# Patient Record
Sex: Female | Born: 1935
Health system: Southern US, Community
[De-identification: ages and names within clinical notes are randomized; demographics above are authoritative.]

## PROBLEM LIST (undated history)

## (undated) ENCOUNTER — Emergency Department (HOSPITAL_BASED_OUTPATIENT_CLINIC_OR_DEPARTMENT_OTHER): Admission: EM | Payer: Medicare Other

## (undated) DIAGNOSIS — C4490 Unspecified malignant neoplasm of skin, unspecified: Secondary | ICD-10-CM

## (undated) DIAGNOSIS — I4891 Unspecified atrial fibrillation: Secondary | ICD-10-CM

## (undated) DIAGNOSIS — M316 Other giant cell arteritis: Secondary | ICD-10-CM

## (undated) DIAGNOSIS — H3530 Unspecified macular degeneration: Secondary | ICD-10-CM

## (undated) DIAGNOSIS — C801 Malignant (primary) neoplasm, unspecified: Secondary | ICD-10-CM

## (undated) DIAGNOSIS — I10 Essential (primary) hypertension: Secondary | ICD-10-CM

## (undated) DIAGNOSIS — M353 Polymyalgia rheumatica: Secondary | ICD-10-CM

## (undated) DIAGNOSIS — M810 Age-related osteoporosis without current pathological fracture: Secondary | ICD-10-CM

## (undated) DIAGNOSIS — N189 Chronic kidney disease, unspecified: Secondary | ICD-10-CM

## (undated) HISTORY — DX: Unspecified macular degeneration: H35.30

## (undated) HISTORY — DX: Malignant (primary) neoplasm, unspecified: C80.1

## (undated) HISTORY — DX: Other giant cell arteritis: M31.6

## (undated) HISTORY — DX: Age-related osteoporosis without current pathological fracture: M81.0

## (undated) HISTORY — DX: Polymyalgia rheumatica: M35.3

## (undated) HISTORY — DX: Unspecified malignant neoplasm of skin, unspecified: C44.90

## (undated) HISTORY — DX: Essential (primary) hypertension: I10

---

## 1996-01-07 HISTORY — PX: MASTECTOMY: SHX3

## 1997-09-18 ENCOUNTER — Ambulatory Visit (HOSPITAL_COMMUNITY): Admission: RE | Admit: 1997-09-18 | Discharge: 1997-09-18 | Payer: Self-pay | Admitting: *Deleted

## 1997-09-29 ENCOUNTER — Ambulatory Visit (HOSPITAL_COMMUNITY): Admission: RE | Admit: 1997-09-29 | Discharge: 1997-09-29 | Payer: Self-pay | Admitting: *Deleted

## 1998-09-24 ENCOUNTER — Ambulatory Visit (HOSPITAL_COMMUNITY): Admission: RE | Admit: 1998-09-24 | Discharge: 1998-09-24 | Payer: Self-pay | Admitting: *Deleted

## 1998-12-06 ENCOUNTER — Ambulatory Visit (HOSPITAL_COMMUNITY): Admission: RE | Admit: 1998-12-06 | Discharge: 1998-12-06 | Payer: Self-pay | Admitting: *Deleted

## 1999-04-04 ENCOUNTER — Other Ambulatory Visit: Admission: RE | Admit: 1999-04-04 | Discharge: 1999-04-04 | Payer: Self-pay | Admitting: *Deleted

## 1999-10-09 ENCOUNTER — Ambulatory Visit (HOSPITAL_COMMUNITY): Admission: RE | Admit: 1999-10-09 | Discharge: 1999-10-09 | Payer: Self-pay | Admitting: *Deleted

## 2000-02-27 ENCOUNTER — Other Ambulatory Visit: Admission: RE | Admit: 2000-02-27 | Discharge: 2000-02-27 | Payer: Self-pay | Admitting: Surgery

## 2000-10-09 ENCOUNTER — Encounter: Payer: Self-pay | Admitting: Oncology

## 2000-10-09 ENCOUNTER — Ambulatory Visit (HOSPITAL_COMMUNITY): Admission: RE | Admit: 2000-10-09 | Discharge: 2000-10-09 | Payer: Self-pay | Admitting: Oncology

## 2000-10-29 ENCOUNTER — Other Ambulatory Visit: Admission: RE | Admit: 2000-10-29 | Discharge: 2000-10-29 | Payer: Self-pay | Admitting: *Deleted

## 2002-10-14 ENCOUNTER — Encounter: Payer: Self-pay | Admitting: Oncology

## 2002-10-14 ENCOUNTER — Encounter: Admission: RE | Admit: 2002-10-14 | Discharge: 2002-10-14 | Payer: Self-pay | Admitting: Oncology

## 2003-11-03 ENCOUNTER — Encounter: Admission: RE | Admit: 2003-11-03 | Discharge: 2003-11-03 | Payer: Self-pay | Admitting: Family Medicine

## 2004-10-16 ENCOUNTER — Encounter (INDEPENDENT_AMBULATORY_CARE_PROVIDER_SITE_OTHER): Payer: Self-pay | Admitting: Specialist

## 2004-10-16 ENCOUNTER — Ambulatory Visit (HOSPITAL_BASED_OUTPATIENT_CLINIC_OR_DEPARTMENT_OTHER): Admission: RE | Admit: 2004-10-16 | Discharge: 2004-10-16 | Payer: Self-pay | Admitting: General Surgery

## 2004-11-22 ENCOUNTER — Encounter: Admission: RE | Admit: 2004-11-22 | Discharge: 2004-11-22 | Payer: Self-pay | Admitting: Family Medicine

## 2005-11-24 ENCOUNTER — Encounter: Admission: RE | Admit: 2005-11-24 | Discharge: 2005-11-24 | Payer: Self-pay | Admitting: Family Medicine

## 2006-11-27 ENCOUNTER — Encounter: Admission: RE | Admit: 2006-11-27 | Discharge: 2006-11-27 | Payer: Self-pay | Admitting: *Deleted

## 2006-12-09 ENCOUNTER — Encounter: Admission: RE | Admit: 2006-12-09 | Discharge: 2006-12-09 | Payer: Self-pay | Admitting: *Deleted

## 2007-03-12 ENCOUNTER — Ambulatory Visit: Payer: Self-pay | Admitting: Nephrology

## 2007-03-12 ENCOUNTER — Observation Stay (HOSPITAL_COMMUNITY): Admission: EM | Admit: 2007-03-12 | Discharge: 2007-03-13 | Payer: Self-pay | Admitting: Emergency Medicine

## 2007-03-29 ENCOUNTER — Encounter: Admission: RE | Admit: 2007-03-29 | Discharge: 2007-03-29 | Payer: Self-pay | Admitting: Gastroenterology

## 2007-03-31 ENCOUNTER — Encounter: Admission: RE | Admit: 2007-03-31 | Discharge: 2007-03-31 | Payer: Self-pay | Admitting: Gastroenterology

## 2007-08-05 ENCOUNTER — Ambulatory Visit (HOSPITAL_COMMUNITY): Admission: RE | Admit: 2007-08-05 | Discharge: 2007-08-05 | Payer: Self-pay | Admitting: Ophthalmology

## 2007-12-07 ENCOUNTER — Encounter: Admission: RE | Admit: 2007-12-07 | Discharge: 2007-12-07 | Payer: Self-pay | Admitting: Family Medicine

## 2008-12-07 ENCOUNTER — Encounter: Admission: RE | Admit: 2008-12-07 | Discharge: 2008-12-07 | Payer: Self-pay | Admitting: Family Medicine

## 2009-06-30 ENCOUNTER — Encounter: Admission: RE | Admit: 2009-06-30 | Discharge: 2009-06-30 | Payer: Self-pay | Admitting: Sports Medicine

## 2009-07-01 ENCOUNTER — Encounter (INDEPENDENT_AMBULATORY_CARE_PROVIDER_SITE_OTHER): Payer: Self-pay | Admitting: Emergency Medicine

## 2009-07-01 ENCOUNTER — Emergency Department (HOSPITAL_COMMUNITY): Admission: EM | Admit: 2009-07-01 | Discharge: 2009-07-01 | Payer: Self-pay | Admitting: Emergency Medicine

## 2009-07-01 ENCOUNTER — Ambulatory Visit: Payer: Self-pay | Admitting: Surgery

## 2009-07-02 ENCOUNTER — Ambulatory Visit: Payer: Self-pay | Admitting: Cardiology

## 2009-07-02 ENCOUNTER — Inpatient Hospital Stay (HOSPITAL_COMMUNITY): Admission: AD | Admit: 2009-07-02 | Discharge: 2009-07-10 | Payer: Self-pay | Admitting: Orthopedic Surgery

## 2009-07-02 ENCOUNTER — Encounter: Payer: Self-pay | Admitting: Internal Medicine

## 2009-07-02 DIAGNOSIS — A4902 Methicillin resistant Staphylococcus aureus infection, unspecified site: Secondary | ICD-10-CM | POA: Insufficient documentation

## 2009-07-02 DIAGNOSIS — M009 Pyogenic arthritis, unspecified: Secondary | ICD-10-CM | POA: Insufficient documentation

## 2009-07-02 HISTORY — DX: Methicillin resistant Staphylococcus aureus infection, unspecified site: A49.02

## 2009-07-03 ENCOUNTER — Ambulatory Visit: Payer: Self-pay | Admitting: Internal Medicine

## 2009-07-05 ENCOUNTER — Encounter (INDEPENDENT_AMBULATORY_CARE_PROVIDER_SITE_OTHER): Payer: Self-pay | Admitting: Orthopedic Surgery

## 2009-07-10 ENCOUNTER — Encounter (INDEPENDENT_AMBULATORY_CARE_PROVIDER_SITE_OTHER): Payer: Self-pay | Admitting: Orthopedic Surgery

## 2009-07-16 ENCOUNTER — Encounter (INDEPENDENT_AMBULATORY_CARE_PROVIDER_SITE_OTHER): Payer: Self-pay | Admitting: *Deleted

## 2009-07-16 DIAGNOSIS — M353 Polymyalgia rheumatica: Secondary | ICD-10-CM | POA: Insufficient documentation

## 2009-07-16 DIAGNOSIS — I10 Essential (primary) hypertension: Secondary | ICD-10-CM | POA: Insufficient documentation

## 2009-07-16 DIAGNOSIS — M316 Other giant cell arteritis: Secondary | ICD-10-CM

## 2009-07-17 ENCOUNTER — Ambulatory Visit: Payer: Self-pay | Admitting: Internal Medicine

## 2009-07-20 ENCOUNTER — Encounter: Payer: Self-pay | Admitting: Internal Medicine

## 2009-09-04 ENCOUNTER — Ambulatory Visit: Payer: Self-pay | Admitting: Internal Medicine

## 2009-09-18 ENCOUNTER — Encounter: Payer: Self-pay | Admitting: Internal Medicine

## 2009-09-26 ENCOUNTER — Inpatient Hospital Stay (HOSPITAL_COMMUNITY): Admission: RE | Admit: 2009-09-26 | Discharge: 2009-09-29 | Payer: Self-pay | Admitting: Orthopedic Surgery

## 2009-09-27 ENCOUNTER — Ambulatory Visit: Payer: Self-pay | Admitting: Infectious Diseases

## 2009-10-13 ENCOUNTER — Emergency Department (HOSPITAL_COMMUNITY): Admission: EM | Admit: 2009-10-13 | Discharge: 2009-10-13 | Payer: Self-pay | Admitting: Emergency Medicine

## 2009-10-15 ENCOUNTER — Ambulatory Visit (HOSPITAL_COMMUNITY): Admission: RE | Admit: 2009-10-15 | Discharge: 2009-10-15 | Payer: Self-pay | Admitting: Orthopedic Surgery

## 2009-10-31 ENCOUNTER — Ambulatory Visit: Payer: Self-pay | Admitting: Infectious Diseases

## 2009-10-31 ENCOUNTER — Encounter: Payer: Self-pay | Admitting: Infectious Diseases

## 2009-11-01 ENCOUNTER — Encounter: Payer: Self-pay | Admitting: Infectious Diseases

## 2009-11-13 ENCOUNTER — Telehealth: Payer: Self-pay | Admitting: Infectious Diseases

## 2009-11-14 ENCOUNTER — Encounter: Payer: Self-pay | Admitting: Infectious Diseases

## 2009-12-13 ENCOUNTER — Encounter
Admission: RE | Admit: 2009-12-13 | Discharge: 2009-12-13 | Payer: Self-pay | Source: Home / Self Care | Admitting: Family Medicine

## 2010-01-21 ENCOUNTER — Ambulatory Visit
Admission: RE | Admit: 2010-01-21 | Discharge: 2010-01-21 | Payer: Self-pay | Source: Home / Self Care | Attending: Infectious Diseases | Admitting: Infectious Diseases

## 2010-01-21 ENCOUNTER — Encounter: Payer: Self-pay | Admitting: Infectious Diseases

## 2010-01-21 LAB — CONVERTED CEMR LAB: CRP: 0.5 mg/dL (ref <0.6)

## 2010-01-27 ENCOUNTER — Encounter: Payer: Self-pay | Admitting: *Deleted

## 2010-01-27 ENCOUNTER — Encounter: Payer: Self-pay | Admitting: Rheumatology

## 2010-02-07 NOTE — Miscellaneous (Signed)
Summary: [place]   Imported By: Bonner Puna 11/20/2009 16:52:51  _____________________________________________________________________  External Attachment:    Type:   Image     Comment:   External Document

## 2010-02-07 NOTE — Miscellaneous (Signed)
Summary: Problems, Medications and allergies  Clinical Lists Changes  Problems: Added new problem of PYOGENIC ARTHRITIS, LOWER LEG (ICD-711.06) - right knee Added new problem of HYPERTENSION (ICD-401.9) Added new problem of MRSA (ICD-041.12) - septic right knee Added new problem of POLYMYALGIA RHEUMATICA (ICD-725) Added new problem of TEMPORAL ARTERITIS (ICD-446.5) Medications: Added new medication of VANCOMYCIN HCL 1000 MG SOLR (VANCOMYCIN HCL) per pharmacy protocol Added new medication of MIRALAX  PACK (POLYETHYLENE GLYCOL 3350) One packet dissoved in fluid.  Drink by mouth every evening Added new medication of CALCIUM-VITAMIN D 600-200 MG-UNIT TABS (CALCIUM-VITAMIN D) Take 1 tablet by mouth two times a day per PCP Added new medication of FUROSEMIDE 80 MG TABS (FUROSEMIDE) Take 1 tablet by mouth once a day per PCP Added new medication of CLONIDINE HCL 0.3 MG TABS (CLONIDINE HCL) Take 1 tablet by mouth two times a day  per PCP Added new medication of TOPROL XL 100 MG XR24H-TAB (METOPROLOL SUCCINATE) Take 1 tablet by mouth once a day per PCP Added new medication of LOSARTAN POTASSIUM 100 MG TABS (LOSARTAN POTASSIUM) Take 1 tablet by mouth once a day per PCP Added new medication of DILT-CD 240 MG XR24H-CAP (DILTIAZEM HCL COATED BEADS) Take 1 tablet by mouth once a day per PCP Added new medication of DOK 100 MG CAPS (DOCUSATE SODIUM) Take 1 capsule by mouth two times a day per [place]. MD Added new medication of ASPIRIN 81 MG TABS (ASPIRIN) Take 1 tablet by mouth once a day Added new medication of PROTONIX 40 MG TBEC (PANTOPRAZOLE SODIUM) Take 1 tablet by mouth once a day per [facility]. MD Added new medication of AMBIEN 5 MG TABS (ZOLPIDEM TARTRATE) Take 1-2 tablets by mouth as needed for sleep Added new medication of ULTRAM 50 MG TABS (TRAMADOL HCL) Take 1 tablet by mouth every 4-6 hours as needed for pain Allergies: Added new allergy or adverse reaction of PCN Added new allergy or adverse reaction  of CODEINE Observations: Added new observation of NKA: F (07/16/2009 14:12)

## 2010-02-07 NOTE — Miscellaneous (Signed)
Summary: [place]:   [place]:   Imported By: Bonner Puna 12/19/2009 10:26:11  _____________________________________________________________________  External Attachment:    Type:   Image     Comment:   External Document

## 2010-02-07 NOTE — Assessment & Plan Note (Signed)
Summary: 6WK F/U/VS   CC:  follow-up visit, still having problems with her tongue, and pain increased in right knee over the past week.  History of Present Illness: Allison Norman is in for her routine follow-up visit.  She has now been off of IV vancomycin for a little over 5 weeks.  She has completed her physical therapy.  She has had no fever or chills. The swelling and redness in her right knee are much improved.  She notes that the pain has been worse in the last month but says that she had stopped taking her hydrocodone.  She restarted it twice daily about a week ago and says the pain has improved.  She saw Dr. Onnie Graham recently who felt like the infection was resolved. She has an appointment with Dr. Hector Shade on September 12 to consider joint replacement surgery.  Preventive Screening-Counseling & Management  Alcohol-Tobacco     Alcohol drinks/day: no     Smoking Status: no  Caffeine-Diet-Exercise     Caffeine use/day: yes     Does Patient Exercise: stopped PT @ home last week   Updated Prior Medication List: MIRALAX  PACK (POLYETHYLENE GLYCOL 3350) One packet dissoved in fluid.  Drink by mouth every evening CALCIUM-VITAMIN D 600-200 MG-UNIT TABS (CALCIUM-VITAMIN D) Take 1 tablet by mouth two times a day per PCP FUROSEMIDE 80 MG TABS (FUROSEMIDE) Take 1 tablet by mouth once a day per PCP CLONIDINE HCL 0.3 MG TABS (CLONIDINE HCL) Take 1 tablet by mouth two times a day  per PCP TOPROL XL 100 MG XR24H-TAB (METOPROLOL SUCCINATE) Take 1 tablet by mouth once a day per PCP LOSARTAN POTASSIUM 100 MG TABS (LOSARTAN POTASSIUM) Take 1 tablet by mouth once a day per PCP DILT-CD 240 MG XR24H-CAP (DILTIAZEM HCL COATED BEADS) Take 1 tablet by mouth once a day per PCP DOK 100 MG CAPS (DOCUSATE SODIUM) Take 1 capsule by mouth two times a day per [place]. MD ASPIRIN 81 MG TABS (ASPIRIN) Take 1 tablet by mouth once a day PROTONIX 40 MG TBEC (PANTOPRAZOLE SODIUM) Take 1 tablet by mouth once a day  per [facility]. MD AMBIEN 5 MG TABS (ZOLPIDEM TARTRATE) Take 1-2 tablets by mouth as needed for sleep ULTRAM 50 MG TABS (TRAMADOL HCL) Take 1 tablet by mouth every 4-6 hours as needed for pain HYDROCODONE-ACETAMINOPHEN 5-325 MG TABS (HYDROCODONE-ACETAMINOPHEN) Take 1 tablet by mouth four times a day as needed for pain  Current Allergies (reviewed today): ! PCN ! CODEINE Additional History Menstrual Status:  postmenopausal  Vital Signs:  Patient profile:   77 year old female Menstrual status:  postmenopausal Height:      65.5 inches (166.37 cm) Weight:      136 pounds (61.82 kg) BMI:     22.37 Temp:     98.6 degrees F (37.00 degrees C) oral Pulse rate:   79 / minute BP sitting:   152 / 69  (left arm) Cuff size:   regular  Vitals Entered By: Lorne Skeens RN (September 04, 2009 10:54 AM) CC: follow-up visit, still having problems with her tongue, pain increased in right knee over the past week Is Patient Diabetic? No Pain Assessment Patient in pain? yes     Location: right knee, ankle Intensity: 7 Type: sharp, grabbing, buring Onset of pain  increases while walking,   Nutritional Status BMI of 19 -24 = normal Nutritional Status Detail appetite "very poor"  Have you ever been in a relationship where you felt threatened, hurt or  afraid?not asked, family present   Does patient need assistance? Functional Status Self care Ambulation Impaired:Risk for fall Comments uses rolling walker     Menstrual Status postmenopausal   Physical Exam  General:  alert and well-nourished.   Extremities:  the swelling in her right knee and calf has decreased.  There is no redness or warmth of the knee.   Impression & Recommendations:  Problem # 1:  PYOGENIC ARTHRITIS, LOWER LEG (ICD-711.06) I strongly suspect that her MRSA septic arthritis is now cured.  She noticed call me if she has any signs of recurrent infection. Her updated medication list for this problem includes:    Aspirin 81 Mg  Tabs (Aspirin) .[name]... Take 1 tablet by mouth once a day    Ultram 50 Mg Tabs (Tramadol hcl) .[name]... Take 1 tablet by mouth every 4-6 hours as needed for pain    Hydrocodone-acetaminophen 5-325 Mg Tabs (Hydrocodone-acetaminophen) .[name]... Take 1 tablet by mouth four times a day as needed for pain  Orders: Est. Patient Level III [id number]  Medications Added to Medication List This Visit: 1)  Hydrocodone-acetaminophen 5-325 Mg Tabs (Hydrocodone-acetaminophen) .... Take 1 tablet by mouth four times a day as needed for pain 2)  First-bxn Mouthwash Susp (Diphenhyd-lidocaine-nystatin) .... One teaspoon by mouth swish and spit three times a day  Patient Instructions: 1)  Please schedule a follow-up appointment as needed. Prescriptions: FIRST-BXN MOUTHWASH  SUSP (DIPHENHYD-LIDOCAINE-NYSTATIN) One teaspoon by mouth swish and spit three times a day  #1 bottle x 1   Entered and Authorized by:   Michel Bickers MD   Signed by:   Michel Bickers MD on 09/04/2009   Method used:   Print then Give to Patient   RxID:   [id number] HYDROCODONE-ACETAMINOPHEN 5-325 MG TABS (HYDROCODONE-ACETAMINOPHEN) Take 1 tablet by mouth four times a day as needed for pain  #60 x 0   Entered and Authorized by:   Michel Bickers MD   Signed by:   Michel Bickers MD on 09/04/2009   Method used:   Print then Give to Patient   RxID:   [id number]   Appended Document: 6WK F/U/VS Magic Mouthwash and hydrocodone/APAP rxes called to [facility] on [address].

## 2010-02-07 NOTE — Assessment & Plan Note (Signed)
Summary: f/u   CC:  follow-up visit.  History of Present Illness: 77 yo F with hx of adm to [facility] June 2011 with R knee septic arthritis. She underwent I & D June  29 and she was treated with 6 weeks of IV vancomycin.  Was admitted 9-21 to 09-29-09 with recurrence of her R knee MRSA septic arthritis. She had I and D, synovectomy 09-27-09. ESR 123 and CRP 24.3. SHe was d/c home on vancomycin (currently at 75mg  every other day).  Today feels like her knee feels better. No fevers or chills, no problems with PIC line (except when she pulled it out and had to go to hospital and have reinserted). Still using walker, has been started on walking with a cane yesterday.   Preventive Screening-Counseling & Management  Alcohol-Tobacco     Alcohol drinks/day: no     Smoking Status: never  Caffeine-Diet-Exercise     Caffeine use/day: 0     Does Patient Exercise: PT for the next several weeks  Safety-Violence-Falls     Seat Belt Use: yes   Updated Prior Medication List: MIRALAX  PACK (POLYETHYLENE GLYCOL 3350) One packet dissoved in fluid.  Drink by mouth every evening CALCIUM-VITAMIN D 600-200 MG-UNIT TABS (CALCIUM-VITAMIN D) Take 1 tablet by mouth two times a day per PCP FUROSEMIDE 80 MG TABS (FUROSEMIDE) Take 1 tablet by mouth once a day per PCP CLONIDINE HCL 0.3 MG TABS (CLONIDINE HCL) Take 1 tablet by mouth two times a day  per PCP TOPROL XL 100 MG XR24H-TAB (METOPROLOL SUCCINATE) Take 1 tablet by mouth once a day per PCP LOSARTAN POTASSIUM 100 MG TABS (LOSARTAN POTASSIUM) Take 1 tablet by mouth once a day per PCP DILT-CD 240 MG XR24H-CAP (DILTIAZEM HCL COATED BEADS) Take 1 tablet by mouth once a day per PCP DOK 100 MG CAPS (DOCUSATE SODIUM) Take 1 capsule by mouth two times a day per [place]. MD ASPIRIN 81 MG TABS (ASPIRIN) Take 1 tablet by mouth once a day PROTONIX 40 MG TBEC (PANTOPRAZOLE SODIUM) Take 1 tablet by mouth once a day per [facility]. MD ULTRAM 50 MG TABS (TRAMADOL HCL) Take 1 tablet  by mouth every 4-6 hours as needed for pain HYDROCODONE-ACETAMINOPHEN 5-325 MG TABS (HYDROCODONE-ACETAMINOPHEN) Take 1 tablet by mouth four times a day as needed for pain FIRST-BXN MOUTHWASH  SUSP (DIPHENHYD-LIDOCAINE-NYSTATIN) One teaspoon by mouth swish and spit three times a day VANCOMYCIN HCL 500 MG SOLR (VANCOMYCIN HCL) 750 mg every other day  Current Allergies (reviewed today): ! PCN ! CODEINE Past History:  Past Medical History: Current Problems:  TEMPORAL ARTERITIS (ICD-446.5) POLYMYALGIA RHEUMATICA (ICD-725) MRSA (ICD-041.12) HYPERTENSION (ICD-401.9) PYOGENIC ARTHRITIS, LOWER LEG (ICD-711.06)  Family History: Family History Hypertension  Social History: Never Smoked Alcohol use-no  Review of Systems       nl BM, nl urination (feels like she is going more since on antibiotics)  Vital Signs:  Patient profile:   77 year old female Menstrual status:  postmenopausal Height:      65.5 inches (166.37 cm) Weight:      138.0 pounds (62.73 kg) BMI:     22.70 Temp:     98.0 degrees F (36.67 degrees C) oral Pulse rate:   74 / minute BP sitting:   180 / 83  (left arm)  Vitals Entered By: Rocky Morel) (October 31, 2009 11:50 AM) CC: follow-up visit Pain Assessment Patient in pain? no      Nutritional Status BMI of 19 -24 = normal Nutritional  Status Detail appetite is better than it was per patient  Does patient need assistance? Functional Status Self care Ambulation Normal Comments patient walks with a walker   Physical Exam  General:  well-developed, well-nourished, and well-hydrated.   Eyes:  pupils equal, pupils round, and pupils reactive to light.   Mouth:  pharynx pink and moist and no exudates.   Lungs:  normal respiratory effort and normal breath sounds.   Heart:  normal rate, regular rhythm, and no murmur.   Abdomen:  soft, non-tender, and normal bowel sounds.   Extremities:  RUE PIC- clean, nontender, no d/c, no erythema.  R Knee- swollen,  warm. No effusion, non-tender. no erythema. wound well healed.    Allergies (verified): 1)  ! Pcn 2)  ! Codeine   Impression & Recommendations:  Problem # 1:  PYOGENIC ARTHRITIS, LOWER LEG (ICD-711.06)  she appears to be improving. Will recheck her ESR and CRP at next blood draw. Will plan to d/c her IV antibiotics and her PIC line on 11-3 which will be 6 weeks of therapy. WIll continue her on by mouth doxycycline for at least 6 weeks after this. She is warned regarding possible phototoxicity and GI upset with this med. she will return to clinic in 2-3 months.  Her updated medication list for this problem includes:    Aspirin 81 Mg Tabs (Aspirin) .[name]... Take 1 tablet by mouth once a day    Ultram 50 Mg Tabs (Tramadol hcl) .[name]... Take 1 tablet by mouth every 4-6 hours as needed for pain    Hydrocodone-acetaminophen 5-325 Mg Tabs (Hydrocodone-acetaminophen) .[name]... Take 1 tablet by mouth four times a day as needed for pain    Vancomycin Hcl 500 Mg Solr (Vancomycin hcl) .[name] KitchenMarland KitchenMarland KitchenMarland Kitchen 750 mg every other day    Doxycycline Hyclate 100 Mg Caps (Doxycycline hyclate) .[name]... Take 1 tablet by mouth two times a day  Orders: Consultation Level IV [id number])  Medications Added to Medication List This Visit: 1)  Vancomycin Hcl 500 Mg Solr (Vancomycin hcl) .... 750 mg every other day 2)  Doxycycline Hyclate 100 Mg Caps (Doxycycline hyclate) .... Take 1 tablet by mouth two times a day Prescriptions: DOXYCYCLINE HYCLATE 100 MG CAPS (DOXYCYCLINE HYCLATE) Take 1 tablet by mouth two times a day  #60 x 1   Entered and Authorized by:   Bobby Rumpf MD   Signed by:   Bobby Rumpf MD on 10/31/2009   Method used:   Electronically to        [id number] (retail)       [address]       [place], [place]  [address]       Ph: [phone] or [phone]       Fax: [phone]   RxID:   [id number]

## 2010-02-07 NOTE — Progress Notes (Signed)
  Phone Note Call from Patient Call back at Home Phone [phone]   Caller: Patient Call For: Dr. Johnnye Sima Summary of Call: Patient called stating that she can't tolerate the doxycycline she has been vomiting and having nausea. The patient wants to try something else if possible. Initial call taken by: Jarrett Ables CMA,  November 13, 2009 10:04 AM

## 2010-02-07 NOTE — Letter (Signed)
Summary: Discharge Summary  Discharge Summary   Imported By: Bonner Puna 07/19/2009 09:03:02  _____________________________________________________________________  External Attachment:    Type:   Image     Comment:   External Document

## 2010-02-07 NOTE — Consult Note (Signed)
Summary: [facility]   Imported By: Bonner Puna 07/31/2009 11:52:20  _____________________________________________________________________  External Attachment:    Type:   Image     Comment:   External Document

## 2010-02-07 NOTE — Assessment & Plan Note (Signed)
Summary: hsfu s.aureus septic knee/need chart/   CC:  [facility].  History of Present Illness: [name] is in for hospital follow-up visit.  She was in [facility] last month with MRSA septic right knee arthritis.  She underwent incision and drainage and was discharged home on IV vancomycin.  She is tolerating her PICC and vancomycin therapy well.  She is doing more each day with her physical therapy.  Yesterday she was told that she had a temperature of 100.8 and she was given another type of antibiotic injection.  She was not feeling like she had any fever and denies any other problems including cough, shortness of breath, diarrhea, dysuria, and rash.  She says this morning she has noted more swelling in her right calf and foot but she actually thinks her right knee is less swollen.  Preventive Screening-Counseling & Management  Alcohol-Tobacco     Alcohol drinks/day: no     Smoking Status: no  Caffeine-Diet-Exercise     Caffeine use/day: yes     Does Patient Exercise: Pt at SNF   Prior Medication List:  VANCOMYCIN HCL 1000 MG SOLR (VANCOMYCIN HCL) per pharmacy protocol MIRALAX  PACK (POLYETHYLENE GLYCOL 3350) One packet dissoved in fluid.  Drink by mouth every evening CALCIUM-VITAMIN D 600-200 MG-UNIT TABS (CALCIUM-VITAMIN D) Take 1 tablet by mouth two times a day per PCP FUROSEMIDE 80 MG TABS (FUROSEMIDE) Take 1 tablet by mouth once a day per PCP CLONIDINE HCL 0.3 MG TABS (CLONIDINE HCL) Take 1 tablet by mouth two times a day  per PCP TOPROL XL 100 MG XR24H-TAB (METOPROLOL SUCCINATE) Take 1 tablet by mouth once a day per PCP LOSARTAN POTASSIUM 100 MG TABS (LOSARTAN POTASSIUM) Take 1 tablet by mouth once a day per PCP DILT-CD 240 MG XR24H-CAP (DILTIAZEM HCL COATED BEADS) Take 1 tablet by mouth once a day per PCP DOK 100 MG CAPS (DOCUSATE SODIUM) Take 1 capsule by mouth two times a day per [place]. MD ASPIRIN 81 MG TABS (ASPIRIN) Take 1 tablet by mouth once a day PROTONIX 40  MG TBEC (PANTOPRAZOLE SODIUM) Take 1 tablet by mouth once a day per [facility]. MD AMBIEN 5 MG TABS (ZOLPIDEM TARTRATE) Take 1-2 tablets by mouth as needed for sleep ULTRAM 50 MG TABS (TRAMADOL HCL) Take 1 tablet by mouth every 4-6 hours as needed for pain   Current Allergies (reviewed today): ! PCN ! CODEINE Vital Signs:  Patient profile:   77 year old female Height:      65.5 inches (166.37 cm) Weight:      145 pounds (65.91 kg) BMI:     23.85 Temp:     98.1 degrees F (36.72 degrees C) oral Pulse rate:   72 / minute BP sitting:   143 / 55  (left arm) Cuff size:   regular  Vitals Entered By: Lorne Skeens RN (July 17, 2009 10:36 AM) CC: H[facility] Is Patient Diabetic? No Pain Assessment Patient in pain? yes     Location: right knee Intensity: 3 Type: aching Onset of pain  intermittent, after therapy Nutritional Status BMI of 19 -24 = normal Nutritional Status Detail appetite "not very good"  Have you ever been in a relationship where you felt threatened, hurt or afraid?not asked, family present   Does patient need assistance? Functional Status Cook/clean, Shopping, Social activities Ambulation Wheelchair Comments unable to stand for long periods   Physical Exam  General:  alert and well-nourished.   Extremities:  her right knee incisions are  healing without any drainage.  She does have 2+ pitting edema to her upper calf.  She has no cellulitis.  Her right arm PICC site appears normal.   Impression & Recommendations:  Problem # 1:  PYOGENIC ARTHRITIS, LOWER LEG (ICD-711.06) I am not too concerned about yesterday's low grade fever.  Do not see any evidence of new complications of her MRSA infection or other new health care associated infections.  The right calf swelling may be related to increase time and physical therapy or a complication of her known Baker's cyst.  I have discussed options for duration of therapy with her and her family and will extend IV vancomycin for a  full 4 weeks following her recent surgery.  She her family noted call me if she is having more problems or concerns before her next visit. Her updated medication list for this problem includes:    Vancomycin Hcl 1000 Mg Solr (Vancomycin hcl) .[name]... Per pharmacy protocol    Aspirin 81 Mg Tabs (Aspirin) .[name]... Take 1 tablet by mouth once a day    Ultram 50 Mg Tabs (Tramadol hcl) .[name]... Take 1 tablet by mouth every 4-6 hours as needed for pain  Orders: Est. Patient Level III [id number]  Patient Instructions: 1)  Please schedule a follow-up appointment in 6 weeks. 2)  Please start vancomycin infusions at 5 p.m. 3)   IV vancomycin should continued through July 24 and then have the PICC pulled on July 24. 4)  She does not need any bandage covering her pedal knee incisions for infection prevention purposes.

## 2010-02-07 NOTE — Miscellaneous (Signed)
Summary: HIPAA Restrictions  HIPAA Restrictions   Imported By: Bonner Puna 07/18/2009 09:44:16  _____________________________________________________________________  External Attachment:    Type:   Image     Comment:   External Document

## 2010-02-07 NOTE — Assessment & Plan Note (Signed)
Summary: 81MONTH F/U/VS   CC:  f/u and not taking doxycycline anymore.  History of Present Illness: 77 yo F with hx of PMR and adm to [facility] June 2011 with R knee septic arthritis. She underwent I & D June  29 and she was treated with 6 weeks of IV vancomycin.  Was admitted 9-21 to 09-29-09 with recurrence of her R knee MRSA septic arthritis. She had I and D, synovectomy 09-27-09. ESR 123 and CRP 24.3. She was d/c home on vancomycin. She was seen in ID clinic in f/u 10-31-09 and was changed to by mouth doxy with plan for her to continue on at least 6 weeks.  States that her knee is better- know is walking with a cane, swelling is better (but knee is still big), does not have pain unless she turns it " a certain way" . No fevers or chills. The doxycycline made her throw up, she called office 3x with no repsonse. Has been taking after she ate which did give her diarrhea. She has since completed all the medicines.   Preventive Screening-Counseling & Management  Alcohol-Tobacco     Alcohol drinks/day: 0     Smoking Status: never   Updated Prior Medication List: MIRALAX  PACK (POLYETHYLENE GLYCOL 3350) One packet dissoved in fluid.  Drink by mouth every evening CALCIUM-VITAMIN D 600-200 MG-UNIT TABS (CALCIUM-VITAMIN D) Take 1 tablet by mouth two times a day per PCP FUROSEMIDE 80 MG TABS (FUROSEMIDE) Take 1 tablet by mouth once a day per PCP CLONIDINE HCL 0.3 MG TABS (CLONIDINE HCL) Take 1 tablet by mouth two times a day  per PCP TOPROL XL 100 MG XR24H-TAB (METOPROLOL SUCCINATE) Take 1 tablet by mouth once a day per PCP LOSARTAN POTASSIUM 100 MG TABS (LOSARTAN POTASSIUM) Take 1 tablet by mouth once a day per PCP DILT-CD 240 MG XR24H-CAP (DILTIAZEM HCL COATED BEADS) Take 1 tablet by mouth once a day per PCP DOK 100 MG CAPS (DOCUSATE SODIUM) Take 1 capsule by mouth two times a day per [place]. MD ASPIRIN 81 MG TABS (ASPIRIN) Take 1 tablet by mouth once a day PROTONIX 40 MG TBEC (PANTOPRAZOLE SODIUM)  Take 1 tablet by mouth once a day per [facility]. MD ULTRAM 50 MG TABS (TRAMADOL HCL) Take 1 tablet by mouth every 4-6 hours as needed for pain HYDROCODONE-ACETAMINOPHEN 5-325 MG TABS (HYDROCODONE-ACETAMINOPHEN) Take 1 tablet by mouth four times a day as needed for pain  Current Allergies (reviewed today): ! PCN ! CODEINE Past History:  Past medical, surgical, family and social histories (including risk factors) reviewed, and no changes noted (except as noted below).  Past Medical History: Reviewed history from 10/31/2009 and no changes required. Current Problems:  TEMPORAL ARTERITIS (ICD-446.5) POLYMYALGIA RHEUMATICA (ICD-725) MRSA (ICD-041.12) HYPERTENSION (ICD-401.9) PYOGENIC ARTHRITIS, LOWER LEG (ICD-711.06)  Family History: Reviewed history from 10/31/2009 and no changes required. Family History Hypertension  Social History: Reviewed history from 10/31/2009 and no changes required. Never Smoked Alcohol use-no  Vital Signs:  Patient profile:   77 year old female Menstrual status:  postmenopausal Height:      65.5 inches (166.37 cm) Weight:      138 pounds (62.73 kg) BMI:     22.70 Temp:     98.1 degrees F (36.72 degrees C) oral Pulse rate:   60 / minute BP sitting:   173 / 74  (left arm)  Vitals Entered By: Jarrett Ables CMA (January 21, 2010 2:37 PM) CC: f/u, not taking doxycycline anymore Is Patient Diabetic?  No Pain Assessment Patient in pain? no      Nutritional Status BMI of 19 -24 = normal Nutritional Status Detail nl  Does patient need assistance? Functional Status Self care Ambulation Normal   Physical Exam  General:  well-developed, well-nourished, and well-hydrated.     Knee Exam  Inspection:    mild increase in heat in R knee. There is no effusion.  It is non-tender. It is mild-moderately swollen.    Impression & Recommendations:  Problem # 1:  PYOGENIC ARTHRITIS, LOWER LEG (ICD-711.06)  will recheck her ESR and CRP to see how she is  doing. Clinically she appears to be doing well. If her labs are improved and Dr Elmyra Ricks agrees, will watch her off antibiotics. Her f/u appt will be based oin her labs.  The following medications were removed from the medication list:    Doxycycline Hyclate 100 Mg Caps (Doxycycline hyclate) .[name]... Take 1 tablet by mouth two times a day Her updated medication list for this problem includes:    Aspirin 81 Mg Tabs (Aspirin) .[name]... Take 1 tablet by mouth once a day    Ultram 50 Mg Tabs (Tramadol hcl) .[name]... Take 1 tablet by mouth every 4-6 hours as needed for pain    Hydrocodone-acetaminophen 5-325 Mg Tabs (Hydrocodone-acetaminophen) .[name]... Take 1 tablet by mouth four times a day as needed for pain  Orders: T-C-Reactive Protein [id number]) T-Sed Rate (Automated) [id number]) Est. Patient Level III [id number])

## 2010-02-07 NOTE — Consult Note (Signed)
Summary: [facility]  [facility]   Imported By: Bonner Puna 09/28/2009 09:42:53  _____________________________________________________________________  External Attachment:    Type:   Image     Comment:   External Document

## 2010-02-07 NOTE — Miscellaneous (Signed)
Summary: [place]: PICC Order  [place]: PICC Order   Imported By: Bonner Puna 11/07/2009 15:33:06  _____________________________________________________________________  External Attachment:    Type:   Image     Comment:   External Document

## 2010-03-19 ENCOUNTER — Encounter: Payer: Self-pay | Admitting: *Deleted

## 2010-03-21 LAB — DIFFERENTIAL
Basophils Absolute: 0 10*3/uL (ref 0.0–0.1)
Basophils Relative: 0 % (ref 0–1)
Eosinophils Absolute: 0.5 10*3/uL (ref 0.0–0.7)
Monocytes Absolute: 1.1 10*3/uL — ABNORMAL HIGH (ref 0.1–1.0)
Neutro Abs: 5.5 10*3/uL (ref 1.7–7.7)
Neutrophils Relative %: 58 % (ref 43–77)

## 2010-03-21 LAB — COMPREHENSIVE METABOLIC PANEL
AST: 37 U/L (ref 0–37)
Albumin: 2.6 g/dL — ABNORMAL LOW (ref 3.5–5.2)
BUN: 26 mg/dL — ABNORMAL HIGH (ref 6–23)
Chloride: 99 mEq/L (ref 96–112)
GFR calc non Af Amer: 33 mL/min — ABNORMAL LOW (ref >60)
Potassium: 3.7 mEq/L (ref 3.5–5.1)

## 2010-03-21 LAB — BASIC METABOLIC PANEL
BUN: 11 mg/dL (ref 6–23)
BUN: 12 mg/dL (ref 6–23)
CO2: 27 mEq/L (ref 19–32)
CO2: 28 mEq/L (ref 19–32)
CO2: 30 mEq/L (ref 19–32)
Calcium: 8.4 mg/dL (ref 8.4–10.5)
Calcium: 8.7 mg/dL (ref 8.4–10.5)
Chloride: 103 mEq/L (ref 96–112)
Chloride: 104 mEq/L (ref 96–112)
GFR calc non Af Amer: 51 mL/min — ABNORMAL LOW (ref >60)
Glucose, Bld: 108 mg/dL — ABNORMAL HIGH (ref 70–99)
Glucose, Bld: 117 mg/dL — ABNORMAL HIGH (ref 70–99)
Glucose, Bld: 96 mg/dL (ref 70–99)
Potassium: 3 mEq/L — ABNORMAL LOW (ref 3.5–5.1)
Potassium: 4.1 mEq/L (ref 3.5–5.1)
Sodium: 139 mEq/L (ref 135–145)

## 2010-03-21 LAB — GRAM STAIN: Gram Stain: NONE SEEN

## 2010-03-21 LAB — CBC
HCT: 22.6 % — ABNORMAL LOW (ref 36.0–46.0)
HCT: 25.9 % — ABNORMAL LOW (ref 36.0–46.0)
HCT: 28.6 % — ABNORMAL LOW (ref 36.0–46.0)
Hemoglobin: 8.4 g/dL — ABNORMAL LOW (ref 12.0–15.0)
MCH: 26.3 pg (ref 26.0–34.0)
MCH: 27.6 pg (ref 26.0–34.0)
MCHC: 32.4 g/dL (ref 30.0–36.0)
MCHC: 32.4 g/dL (ref 30.0–36.0)
MCHC: 32.9 g/dL (ref 30.0–36.0)
MCV: 80.8 fL (ref 78.0–100.0)
MCV: 81.2 fL (ref 78.0–100.0)
MCV: 81.3 fL (ref 78.0–100.0)
Platelets: 496 10*3/uL — ABNORMAL HIGH (ref 150–400)
RBC: 3.46 MIL/uL — ABNORMAL LOW (ref 3.87–5.11)
RDW: 16.5 % — ABNORMAL HIGH (ref 11.5–15.5)
RDW: 16.7 % — ABNORMAL HIGH (ref 11.5–15.5)
WBC: 8.7 10*3/uL (ref 4.0–10.5)

## 2010-03-21 LAB — URINALYSIS, ROUTINE W REFLEX MICROSCOPIC
Glucose, UA: NEGATIVE mg/dL
Hgb urine dipstick: NEGATIVE
Specific Gravity, Urine: 1.012 (ref 1.005–1.030)
Urobilinogen, UA: 0.2 mg/dL (ref 0.0–1.0)
pH: 6 (ref 5.0–8.0)

## 2010-03-21 LAB — BODY FLUID CULTURE

## 2010-03-21 LAB — PROTIME-INR: Prothrombin Time: 16.5 seconds — ABNORMAL HIGH (ref 11.6–15.2)

## 2010-03-21 LAB — CROSSMATCH
ABO/RH(D): O POS
Antibody Screen: NEGATIVE

## 2010-03-21 LAB — ANAEROBIC CULTURE

## 2010-03-24 LAB — COMPREHENSIVE METABOLIC PANEL
AST: 28 U/L (ref 0–37)
Albumin: 2 g/dL — ABNORMAL LOW (ref 3.5–5.2)
Albumin: 2.2 g/dL — ABNORMAL LOW (ref 3.5–5.2)
Alkaline Phosphatase: 123 U/L — ABNORMAL HIGH (ref 39–117)
BUN: 39 mg/dL — ABNORMAL HIGH (ref 6–23)
BUN: 50 mg/dL — ABNORMAL HIGH (ref 6–23)
CO2: 30 mEq/L (ref 19–32)
Calcium: 8.7 mg/dL (ref 8.4–10.5)
Chloride: 91 mEq/L — ABNORMAL LOW (ref 96–112)
Chloride: 98 mEq/L (ref 96–112)
Creatinine, Ser: 1.16 mg/dL (ref 0.4–1.2)
Creatinine, Ser: 1.74 mg/dL — ABNORMAL HIGH (ref 0.4–1.2)
Creatinine, Ser: 2.17 mg/dL — ABNORMAL HIGH (ref 0.4–1.2)
GFR calc Af Amer: 54 mL/min — ABNORMAL LOW (ref >60)
GFR calc non Af Amer: 45 mL/min — ABNORMAL LOW (ref >60)
Potassium: 4.4 mEq/L (ref 3.5–5.1)
Total Bilirubin: 0.7 mg/dL (ref 0.3–1.2)
Total Bilirubin: 1 mg/dL (ref 0.3–1.2)
Total Bilirubin: 1.2 mg/dL (ref 0.3–1.2)
Total Protein: 5.9 g/dL — ABNORMAL LOW (ref 6.0–8.3)
Total Protein: 6.1 g/dL (ref 6.0–8.3)

## 2010-03-24 LAB — ANAEROBIC CULTURE

## 2010-03-24 LAB — GRAM STAIN

## 2010-03-24 LAB — CBC
HCT: 25.7 % — ABNORMAL LOW (ref 36.0–46.0)
Hemoglobin: 10.3 g/dL — ABNORMAL LOW (ref 12.0–15.0)
Hemoglobin: 12.2 g/dL (ref 12.0–15.0)
Hemoglobin: 8.9 g/dL — ABNORMAL LOW (ref 12.0–15.0)
MCH: 31.7 pg (ref 26.0–34.0)
MCH: 31.7 pg (ref 26.0–34.0)
MCH: 32.1 pg (ref 26.0–34.0)
MCHC: 34.1 g/dL (ref 30.0–36.0)
MCHC: 33.9 g/dL (ref 30.0–36.0)
MCV: 94.3 fL (ref 78.0–100.0)
MCV: 94.8 fL (ref 78.0–100.0)
Platelets: 329 10*3/uL (ref 150–400)
Platelets: 316 10*3/uL (ref 150–400)
RBC: 2.81 MIL/uL — ABNORMAL LOW (ref 3.87–5.11)
RBC: 3.24 MIL/uL — ABNORMAL LOW (ref 3.87–5.11)
RDW: 14.9 % (ref 11.5–15.5)
RDW: 15 % (ref 11.5–15.5)
RDW: 15.7 % — ABNORMAL HIGH (ref 11.5–15.5)
WBC: 14.7 10*3/uL — ABNORMAL HIGH (ref 4.0–10.5)
WBC: 15.4 10*3/uL — ABNORMAL HIGH (ref 4.0–10.5)

## 2010-03-24 LAB — URINE CULTURE
Colony Count: NO GROWTH
Culture: NO GROWTH

## 2010-03-24 LAB — DIFFERENTIAL
Band Neutrophils: 0 % (ref 0–10)
Basophils Absolute: 0 10*3/uL (ref 0.0–0.1)
Basophils Relative: 0 % (ref 0–1)
Blasts: 0 %
Eosinophils Absolute: 0.2 10*3/uL (ref 0.0–0.7)
Eosinophils Relative: 0 % (ref 0–5)
Eosinophils Relative: 1 % (ref 0–5)
Lymphocytes Relative: 6 % — ABNORMAL LOW (ref 12–46)
Lymphocytes Relative: 8 % — ABNORMAL LOW (ref 12–46)
Lymphs Abs: 1.4 10*3/uL (ref 0.7–4.0)
Monocytes Absolute: 2.1 10*3/uL — ABNORMAL HIGH (ref 0.1–1.0)
Monocytes Relative: 9 % (ref 3–12)

## 2010-03-24 LAB — BASIC METABOLIC PANEL
BUN: 19 mg/dL (ref 6–23)
Calcium: 8 mg/dL — ABNORMAL LOW (ref 8.4–10.5)
Creatinine, Ser: 1.29 mg/dL — ABNORMAL HIGH (ref 0.4–1.2)
GFR calc non Af Amer: 40 mL/min — ABNORMAL LOW (ref >60)
Glucose, Bld: 91 mg/dL (ref 70–99)
Potassium: 4.6 mEq/L (ref 3.5–5.1)

## 2010-03-24 LAB — VANCOMYCIN, TROUGH: Vancomycin Tr: 11.9 ug/mL (ref 10.0–20.0)

## 2010-03-24 LAB — PROCALCITONIN: Procalcitonin: 1.62 ng/mL

## 2010-03-24 LAB — LACTIC ACID, PLASMA: Lactic Acid, Venous: 0.8 mmol/L (ref 0.5–2.2)

## 2010-03-24 LAB — BODY FLUID CULTURE

## 2010-03-24 LAB — BRAIN NATRIURETIC PEPTIDE: Pro B Natriuretic peptide (BNP): 861 pg/mL — ABNORMAL HIGH (ref 0.0–100.0)

## 2010-03-24 LAB — LIPID PANEL
Cholesterol: 81 mg/dL (ref 0–200)
HDL: 18 mg/dL — ABNORMAL LOW (ref >39)

## 2010-03-24 LAB — SYNOVIAL CELL COUNT + DIFF, W/ CRYSTALS
Crystals, Fluid: NONE SEEN
Monocyte-Macrophage-Synovial Fluid: 2 % — ABNORMAL LOW (ref 50–90)
Neutrophil, Synovial: 96 % — ABNORMAL HIGH (ref 0–25)
WBC, Synovial: 152800 /mm3 — ABNORMAL HIGH (ref 0–200)

## 2010-03-24 LAB — URINALYSIS, ROUTINE W REFLEX MICROSCOPIC
Bilirubin Urine: NEGATIVE
Hgb urine dipstick: NEGATIVE
Protein, ur: NEGATIVE mg/dL
Urobilinogen, UA: 0.2 mg/dL (ref 0.0–1.0)

## 2010-03-24 LAB — PATHOLOGIST SMEAR REVIEW

## 2010-03-24 LAB — APTT: aPTT: 34 seconds (ref 24–37)

## 2010-05-21 NOTE — Op Note (Signed)
[name], Allison Norman               ACCOUNT NO.:  [id number]   MEDICAL RECORD NO.:  [id number]          PATIENT TYPE:  AMB   LOCATION:  SDS                          FACILITY:  [facility]   PHYSICIAN:  Robert L. Katy Fitch, M.D.  DATE OF BIRTH:  April 06, 1924   DATE OF PROCEDURE:  08/05/2007  DATE OF DISCHARGE:                               OPERATIVE REPORT   INDICATIONS AND JUSTIFICATIONS FOR PROCEDURE:  Allison Norman has been  followed in our office for number of years and has had previous cataract  surgery done nicely.  She has been developing a left upper lid ptosis  that has become worse and worse over recent years.  This blocks her  upper field vision and the entire upper field vision was reduced when  the lid was allowed to droop compared to when it was tight.  Also,  photographs confirmed the problem.  Otherwise, she corrects to 20/25 in  the right eye and 20/40 in the left and pressures are 9 in the right and  11 in the left.  There is some field loss in the left.  Pupils motility,  conjunctivae, cornea, anterior chamber, and fundus exam are negative  except for some macular disease and the eyelids did show a definite left  ptosis.  She has intraocular lenses.  She decided to have a left ptosis  repaired.  Medically, she should be stable.   JUSTIFICATION FOR PERFORMING PROCEDURE IN AN OUTPATIENT SETTING:  Routine.   JUSTIFICATION FOR OVERNIGHT STAY:  None.   PREOPERATIVE DIAGNOSIS:  Ptosis of the left upper eyelid.   POSTOPERATIVE DIAGNOSIS:  Ptosis of the left upper eyelid.   OPERATION PERFORMED:  Fasanella-Servat ptosis repair of the left upper  lid.   SURGEON:  Robert L. Groat, MD   ANESTHESIA:  1% Xylocaine with epinephrine.   PROCEDURE:  The patient was brought to the minor surgery room and was  prepped and draped.  A frontal lid block was given with 1% Xylocaine and  epinephrine.  The upper eyelid was everted and the tarsal rim was  clamped with 2 curved hemostats.  A stab  incision was made nicely and a  6-0 chromic gut was run behind the hemostats, which were removed and the  tissue that had been clamped with success.  The suture was run back to  the starting point and then was tied so that the knot was buried.  Polysporin ointment was used and the eye was patched.  The patient then  left the operating room having done nicely.   FOLLOWUP CARE:  The patient is to remove the patch in one day and begin  warm compresses and Polysporin ointment.  She is to be seen in my office  as soon as possible if the eyes are extremely scratchy or otherwise in  one week.          ______________________________  Jaymes Graff. Katy Fitch, M.D.    RLG/MEDQ  D:  08/05/2007  T:  08/06/2007  Job:  [id number]

## 2010-05-21 NOTE — H&P (Signed)
Allison Norman NO.:  [id number]   MEDICAL RECORD NO.:  [id number]          PATIENT TYPE:  INP   LOCATION:  1826                         FACILITY:  [facility]   PHYSICIAN:  Bartholomew Boards, MD      DATE OF BIRTH:  1924-08-26   DATE OF ADMISSION:  03/13/2007  DATE OF DISCHARGE:                              HISTORY & PHYSICAL   PRIMARY CARE PHYSICIAN:  Dr. Harle Battiest, [facility].   CHIEF COMPLAINT:  Abdominal pain.   HISTORY OF PRESENT ILLNESS:  [name] is an 77 year old white female  with history of hypertension, remote breast cancer status post therapy,  and polymyalgia rheumatica, who presents with 24 to 36 hours of  abdominal pain. She reports that her chronic antihypertensive regimen  caused her to have constipation, which she was experiencing 2 days ago.  She reports she took an oral laxative and began having crampy diffuse  abdominal pain. After developing this pain, she administered a Fleets  enema, which was started on yesterday morning, March 11, 2007. After the  Fleets enema, she had multiple bowel movements that were described as  loose, numbering 8 total bowel movement. She had nausea over the last 2  days with retching but no production of vomitus. She reports chills but  denies frank fever. Furthermore, she denies blood or fat in the stools.  They were described as liquid without any formed stool. She denies sick  contacts.   PAST MEDICAL HISTORY:  1. Breast cancer.  2. Hypertension.  3. Polymyalgia rheumatica.   FAMILY HISTORY:  Hypertension.   SOCIAL HISTORY:  No tobacco, alcohol, or drug use.   ALLERGIES:  PENICILLIN, CODEINE (rash.)   MEDICATIONS:  1. Benicar 40 mg p.o. daily.  2. Clonidine 0.3 mg p.o. b.i.d.  3. Lasix 80 mg daily.  4. Prednisone 5 mg daily.  5. Tiazac 240 mg daily.  6. Toprol XL 100 mg daily.   REVIEW OF SYSTEMS:  As per HPI. Otherwise, no urinary symptoms. No lower  extremity edema. Additionally, a 14  point review of systems was  completed as above or in the review of systems and was negative.   PHYSICAL EXAMINATION:  VITAL SIGNS:  Temperature 100.4 degrees  Fahrenheit. Pulse 99, respiratory rate 22. Blood pressure 157 to 190  over 65 to 90.  GENERAL:  An elderly white female in mild distress.  HEENT:  Oropharynx dry. Extraocular muscles intact. Pupils are equal,  round, and reactive to light. Normocephalic and atraumatic.  NECK:  Supple. No jugular venous distention.  CHEST:  Clear to auscultation bilaterally. No rales, rhonchi, or wheeze  appreciated.  HEART:  Regular rate and rhythm. Without murmur, rub, or gallop.  ABDOMEN:  Slight tenderness to palpation, upon deep palpation in all 4  quadrants. No rebound or guarding. No masses or organomegaly. Positive  bowel sounds all 4 quadrants.  EXTREMITIES:  Normal range of motion  all 4 extremities. No lower  extremity edema.  SKIN:  Dry with age appropriate turgor. No lesions or rashes  appreciated.  NEUROLOGIC:  No focal deficits appreciated.  LABORATORY DATA:  Urinalysis shows trace protein. Few white blood cells.  Few red blood cells. Serology shows lipase 19, which is normal. Low  potassium at 2.8. Elevated creatinine at 1.7. BUN 30, glucose 151. White  blood cell count 17.4.   CT of the abdomen shows no acute disease.   IMPRESSION:  An 77 year old female with gastritis, most likely viral,  with complication of hypertensive urgency due to medication non-  compliance and possibly renal acute renal failure.   PLAN:  1. Will admit to [facility] for supportive care including IV      fluid, gentle hydration, potassium replacement, and nausea control.      We will obtain prior records to see if patient has acute renal      failure and will monitor for trend and treat as indicated.  2. For the leukocytosis, it is unclear the etiology of this. Will      follow patient expectantly and culture of patient shows signs of       frank fever.  3. For blood pressure control, will resume Clonidine and Toprol XL but      will hold Tiazac, Lasix, and Benicar at this time.  4. For history of polymyalgia rheumatica, will continue on prednisone      5 mg daily.      Bartholomew Boards, MD  Electronically Signed     EWR/MEDQ  D:  03/13/2007  T:  03/13/2007  Job:  [id number]

## 2010-05-24 NOTE — Op Note (Signed)
NAMESILVESTRA, KATE               ACCOUNT NO.:  [id number]   MEDICAL RECORD NO.:  [id number]          PATIENT TYPE:  AMB   LOCATION:  [place]                          FACILITY:  [facility]   PHYSICIAN:  [name] T. Hoxworth, M.D.DATE OF BIRTH:  October 23, 1924   DATE OF PROCEDURE:  10/16/2004  DATE OF DISCHARGE:                                 OPERATIVE REPORT   PREOPERATIVE DIAGNOSIS:  Rule out temporal arteritis.   POSTOPERATIVE DIAGNOSIS:  Rule out temporal arteritis.   BRIEF HISTORY:  Ms. Goetzman is a 77 year old female with polymyositis and  symptoms and physical findings suggestive of temporal arteritis.  A right  temporal artery biopsy has been requested.  The nature of the procedure,  risks of bleeding and infection, were discussed and understood and she is  brought the minor surgical suite for this procedure.   DESCRIPTION OF PROCEDURE:  The right temple was sterilely prepped and  draped.  Local anesthesia was used to infiltrate the skin underlying soft  tissue.  Incision was made in front of the right ear in a skin crease and  dissection sharply carried down through the subcutaneous tissue and  superficial fascia.  The temporal artery was identified.  The incision was  extended somewhat along the course of the artery.  The artery was sharply  dissected free from surrounding tissue.  On either end it was clamped,  divided and tied with 4-0 silk and removed.  An approximately 2 cm segment  artery was removed and oriented on a tongue blade and sent for permanent  pathology.  Skin was then closed with a running 6-0 nylon and a Neosporin  dressing applied.      Darene Lamer. Hoxworth, M.D.  Electronically Signed     BTH/MEDQ  D:  10/16/2004  T:  10/16/2004  Job:  [id number]

## 2010-08-14 ENCOUNTER — Other Ambulatory Visit: Payer: Self-pay | Admitting: Orthopedic Surgery

## 2010-08-14 ENCOUNTER — Encounter (HOSPITAL_COMMUNITY): Payer: Medicare Other

## 2010-08-14 ENCOUNTER — Other Ambulatory Visit (HOSPITAL_COMMUNITY): Payer: Medicare Other

## 2010-08-14 LAB — URINALYSIS, ROUTINE W REFLEX MICROSCOPIC
Glucose, UA: NEGATIVE mg/dL
Leukocytes, UA: NEGATIVE
Protein, ur: NEGATIVE mg/dL
Urobilinogen, UA: 0.2 mg/dL (ref 0.0–1.0)

## 2010-08-14 LAB — CBC
Hemoglobin: 13.2 g/dL (ref 12.0–15.0)
MCHC: 31.9 g/dL (ref 30.0–36.0)

## 2010-08-14 LAB — COMPREHENSIVE METABOLIC PANEL
ALT: 11 U/L (ref 0–35)
BUN: 30 mg/dL — ABNORMAL HIGH (ref 6–23)
CO2: 31 mEq/L (ref 19–32)
Calcium: 10.5 mg/dL (ref 8.4–10.5)
Creatinine, Ser: 1.27 mg/dL — ABNORMAL HIGH (ref 0.50–1.10)
GFR calc Af Amer: 49 mL/min — ABNORMAL LOW (ref >60)
GFR calc non Af Amer: 40 mL/min — ABNORMAL LOW (ref >60)
Glucose, Bld: 95 mg/dL (ref 70–99)
Sodium: 139 mEq/L (ref 135–145)

## 2010-08-14 LAB — C-REACTIVE PROTEIN: CRP: 0.7 mg/dL — ABNORMAL HIGH (ref <0.6)

## 2010-08-14 LAB — PROTIME-INR: Prothrombin Time: 14 seconds (ref 11.6–15.2)

## 2010-08-26 ENCOUNTER — Inpatient Hospital Stay (HOSPITAL_COMMUNITY)
Admission: RE | Admit: 2010-08-26 | Discharge: 2010-08-29 | DRG: 470 | Disposition: A | Discharge status: Skilled Nursing Facility | Payer: Medicare Other | Source: Ambulatory Visit | Attending: Orthopedic Surgery | Admitting: Orthopedic Surgery

## 2010-08-26 DIAGNOSIS — T40605A Adverse effect of unspecified narcotics, initial encounter: Secondary | ICD-10-CM | POA: Diagnosis not present

## 2010-08-26 DIAGNOSIS — K5900 Constipation, unspecified: Secondary | ICD-10-CM | POA: Diagnosis present

## 2010-08-26 DIAGNOSIS — Z8614 Personal history of Methicillin resistant Staphylococcus aureus infection: Secondary | ICD-10-CM

## 2010-08-26 DIAGNOSIS — D62 Acute posthemorrhagic anemia: Secondary | ICD-10-CM | POA: Diagnosis not present

## 2010-08-26 DIAGNOSIS — Z79899 Other long term (current) drug therapy: Secondary | ICD-10-CM

## 2010-08-26 DIAGNOSIS — Y9210 Unspecified residential institution as the place of occurrence of the external cause: Secondary | ICD-10-CM | POA: Diagnosis not present

## 2010-08-26 DIAGNOSIS — I10 Essential (primary) hypertension: Secondary | ICD-10-CM | POA: Diagnosis present

## 2010-08-26 DIAGNOSIS — T450X5A Adverse effect of antiallergic and antiemetic drugs, initial encounter: Secondary | ICD-10-CM | POA: Diagnosis not present

## 2010-08-26 DIAGNOSIS — F29 Unspecified psychosis not due to a substance or known physiological condition: Secondary | ICD-10-CM | POA: Diagnosis not present

## 2010-08-26 DIAGNOSIS — Z01812 Encounter for preprocedural laboratory examination: Secondary | ICD-10-CM

## 2010-08-26 DIAGNOSIS — Z01810 Encounter for preprocedural cardiovascular examination: Secondary | ICD-10-CM

## 2010-08-26 DIAGNOSIS — Z780 Asymptomatic menopausal state: Secondary | ICD-10-CM

## 2010-08-26 DIAGNOSIS — M1710 Unilateral primary osteoarthritis, unspecified knee: Principal | ICD-10-CM | POA: Diagnosis present

## 2010-08-26 LAB — GRAM STAIN: Gram Stain: NONE SEEN

## 2010-08-27 LAB — PROTIME-INR
INR: 1.27 (ref 0.00–1.49)
Prothrombin Time: 16.2 seconds — ABNORMAL HIGH (ref 11.6–15.2)

## 2010-08-27 LAB — CBC
Platelets: 157 10*3/uL (ref 150–400)
RBC: 2.66 MIL/uL — ABNORMAL LOW (ref 3.87–5.11)
WBC: 6.9 10*3/uL (ref 4.0–10.5)

## 2010-08-27 LAB — BASIC METABOLIC PANEL
CO2: 30 mEq/L (ref 19–32)
Calcium: 7.9 mg/dL — ABNORMAL LOW (ref 8.4–10.5)
Chloride: 104 mEq/L (ref 96–112)
Sodium: 137 mEq/L (ref 135–145)

## 2010-08-28 LAB — BASIC METABOLIC PANEL
CO2: 30 mEq/L (ref 19–32)
Calcium: 8.7 mg/dL (ref 8.4–10.5)
GFR calc Af Amer: 52 mL/min — ABNORMAL LOW (ref >60)
GFR calc non Af Amer: 43 mL/min — ABNORMAL LOW (ref >60)
Sodium: 137 mEq/L (ref 135–145)

## 2010-08-28 LAB — PROTIME-INR
INR: 1.33 (ref 0.00–1.49)
Prothrombin Time: 16.7 seconds — ABNORMAL HIGH (ref 11.6–15.2)

## 2010-08-28 LAB — TYPE AND SCREEN: Unit division: 0

## 2010-08-28 LAB — CBC
HCT: 33.2 % — ABNORMAL LOW (ref 36.0–46.0)
RDW: 14.5 % (ref 11.5–15.5)
WBC: 9.4 10*3/uL (ref 4.0–10.5)

## 2010-08-29 LAB — BODY FLUID CULTURE

## 2010-08-29 LAB — PROTIME-INR
INR: 1.74 — ABNORMAL HIGH (ref 0.00–1.49)
Prothrombin Time: 20.7 seconds — ABNORMAL HIGH (ref 11.6–15.2)

## 2010-08-29 LAB — CBC
MCV: 93.1 fL (ref 78.0–100.0)
Platelets: 168 10*3/uL (ref 150–400)
RBC: 3.31 MIL/uL — ABNORMAL LOW (ref 3.87–5.11)
WBC: 6.9 10*3/uL (ref 4.0–10.5)

## 2010-08-31 LAB — ANAEROBIC CULTURE

## 2010-09-05 NOTE — Op Note (Signed)
Allison Norman, Allison Norman NO.:  [id number]  MEDICAL RECORD NO.:  [id number]  LOCATION:  [id number]                         FACILITY:  [facility]  PHYSICIAN:  Gaynelle Arabian, M.D.    DATE OF BIRTH:  [birth year]  DATE OF PROCEDURE:  08/26/2010 DATE OF DISCHARGE:                              OPERATIVE REPORT   PREOPERATIVE DIAGNOSIS:  Osteoarthritis, right knee.  POSTOPERATIVE DIAGNOSIS:  Osteoarthritis, right knee.  PROCEDURE:  Right total knee arthroplasty.  SURGEON:  Gaynelle Arabian, MD  ASSISTANT:  Arlee Muslim, PA-C  ANESTHESIA:  Spinal.  ESTIMATED BLOOD LOSS:  Minimal.  DRAINS:  Hemovac x1.  TOURNIQUET TIME:  51 minutes at 300 mmHg.  COMPLICATIONS:  None.  CONDITION:  Stable, to recovery room.  BRIEF CLINICAL NOTE:  Allison Norman is an 77 year old female with advanced end-stage bone-on-bone arthritis of her right knee with progressively worsening pain and dysfunction.  She has had severe pain for over a year now.  Analgesics have not helped.  She has tricompartmental bone-on-bone and presents now for a total knee arthroplasty.  PROCEDURE IN DETAIL:  After successful administration of spinal anesthetic, a tourniquet was placed on her right thigh and her right lower extremity was prepped and draped in the usual sterile fashion. The extremity was wrapped in Esmarch, knee flexed, tourniquet inflated to 300 mmHg.  Midline incision was made with #10 blade through subcutaneous tissue to the level of the extensor mechanism.  Fresh blade was used make a medial parapatellar arthrotomy.  Minimal fluid was encountered.  The fluid that was encountered was sent for stat Gram- stain, given her prior history of infection.  The soft tissue on the proximal medial tibia was then subperiosteally elevated to joint line with a knife and into the semimembranosus bursa with a Cobb elevator. Soft tissue laterally was elevated with attention being paid to avoid patellar tendon on tibial  tubercle.  She had tricompartmental bone-on- bone changes.  The synovium did not appear inflamed.  I did do a thorough complete synovectomy.  ACL and PCL were removed.  Drill was used to create a starting hole in the distal femur and the canal was thoroughly irrigated.  The 5-degree right valgus alignment guide was placed.  The distal femoral cutting block was pinned to remove 11-mm off the distal femur.  Distal femoral resection was made with an oscillating saw.  The tibia was subluxed forward and the menisci were removed. Extramedullary tibial alignment guide was placed referencing proximally at the medial aspect of the tibial tubercle and distally along the second metatarsal axis and tibial crest.  Block was pinned to remove 2- mm off the more deficient medial side.  Tibial resection was made with an oscillating saw.  Size 3 was the most appropriate tibial component. The proximal tibia was prepared to modular drill and keel punch for the size 3.  Femoral sizing guide was placed, size 3 was most appropriate on the femur.  The rotations marked at epicondylar axis and confirmed by creating a rectangular flexion gap at 90 degrees.  Block was pinned in this rotation and the anterior-posterior chamfer cuts were made. Intercondylar block was placed and that cut was made.  Trial size 3 posterior stabilized femur was placed.  A 10-mm posterior stabilized rotating platform insert trial was placed; there was a tiny bit of hyperextension, so then with a 12.5 which allowed for full extension with excellent varus and valgus, anterior and posterior balance throughout full range of motion.  Patella was everted and thickness measured to be 24-mm.  Freehand resection was taken to 14-mm, 35 template was placed, lug holes were drilled, trial patella was placed and it tracks normally.  Osteophytes removed off the posterior femur with the trial in place.  We then waited approximately 15 minutes  for results of the Gram-stain.  Gram-stain came back rare white cells and no organisms.  We then proceeded with the implantation.  The trials were removed and the cut bone surfaces prepared with pulsatile lavage. Cement was mixed, which was gentamicin-impregnated cement.  Once ready for implantation, the size 3 mobile bearing tibial tray, size 3 posterior stabilized femur, and 35 patella were cemented in place.  The patella was held with a clamp.  Trial 12.5 insert was placed, knee held in full extension, and all extruded cement removed.  When the cement was fully hardened, then the permanent 12.5-mm posterior stabilized rotating platform insert was placed into the tibial tray.  Wound was copiously irrigated with saline solution and the arthrotomy closed over Hemovac drain with interrupted #1 PDS.  Flexion against gravity is 135 degrees and the patella tracks normally.  The tourniquet was released; total time of 51 minutes.  Subcutaneous was closed with interrupted 2-0 Vicryl, subcuticular in running 4-0 Monocryl.  Incision was cleaned and dried and Steri-Strips applied.  Catheter for the Marcaine pain pump was placed and pump was initiated.  Bulky sterile dressings applied and she was placed into a knee immobilizer, awakened and transported to recovery in stable condition.     Gaynelle Arabian, M.D.     FA/MEDQ  D:  08/26/2010  T:  08/26/2010  Job:  [id number]  Electronically Signed by Gaynelle Arabian M.D. on 09/05/2010 04:03:45 PM

## 2010-09-05 NOTE — Discharge Summary (Signed)
Allison Norman, Allison Norman NO.:  [id number]  MEDICAL RECORD NO.:  [id number]  LOCATION:  [id number]                         FACILITY:  [facility]  PHYSICIAN:  Gaynelle Arabian, M.D.    DATE OF BIRTH:  [birth year]  DATE OF ADMISSION:  08/26/2010 DATE OF DISCHARGE:  08/29/2010                        DISCHARGE SUMMARY - REFERRING   ADMITTING DIAGNOSES: 1. Osteoarthritis, right knee. 2. Hypertension. 3. Chronic constipation. 4. Postmenopausal. 5. Past history of methicillin-resistant staph aureus.  DISCHARGE DIAGNOSES: 1. Osteoarthritis of right knee, status post right total knee     replacement and arthroplasty. 2. Postoperative acute blood loss anemia. 3. Status post transfusion without sequelae. 4. Hypertension. 5. Chronic constipation. 6. Postmenopausal. 7. Past history of methicillin-resistant staph aureus.  PROCEDURE:  On August 26, 2010, right total knee.  SURGEON:  Dr. Wynelle Link.  ASSISTANT:  Alexzandrew Perkins, P.A.C.  ANESTHESIA:  Spinal anesthesia.  TOURNIQUET TIME:  51 minutes.  CONSULTS:  None.  BRIEF HISTORY:  Allison Norman is an 77 year old female with advanced arthritis, bone-on-bone, and the right knee with progressive worsening pain dysfunction.  She has had severe pain for over a year and now, analgesics have not helped.  She has had tricompartmental bone-on-bone; and presents now for total knee arthroplasty.  LABORATORY DATA:  Preoperative CBC showed hemoglobin of 13.2, hematocrit of 41.4, white cell count 8.4, and platelets 264.  PT/INR preoperatively was 14.0 and 1.06 with a PTT of 38.  Chemistry panel on admission was minimally elevated with a potassium of 3.3, BUN was elevated 30, creatinine was elevated at 1.27, alkaline phosphatase was elevated at 143.  Preoperative UA was negative.  Blood group type was O+.  Nasal swabs were negative.  Staph aureus negative for MRSA.  A Gram stain taken at the time of surgery was with no organisms.  Anaerobe  culture, no organisms seen on the smear and no anaerobes isolated.  Fluid culture, no organisms seen on the smear and no growth on the culture. Serial CBCs were followed.  Hemoglobin dropped down to 8.3 noted on postoperative day #1, given 2 small back up to 11.  Last H and H 10.1 and 30.8.  Serial protimes followed per Coumadin protocol.  Last PT/INR 20.7 and 1.74.  Serial BMETs were followed for 48 hours.  Potassium came up to a normal level of 4.1 and creatinine came down to 1.19, BUN came down to 30, and glucose went up 95 to 137, back down to 124.  EKG did on August 14, 2010, sinus bradycardia, otherwise normal heart rate, decreased that previous tracing, confirmed by Dr. Kirk Ruths.  HOSPITAL COURSE:  The patient was admitted to the hospital, taken to OR, and underwent the above stated procedure without complication.  The patient tolerated the procedure well and later transferred form recovery room to the orthopedic floor.  She was actually doing very well in the morning of day #1 with very little pain.  She was seen in rounds by Dr. Wynelle Link, which she knew want to look in [place], so we got social work involved.  She started getting up out of bed.  Her hemoglobins were down to 8.3 postoperatively.  She was seen  and hemodynamically stable, but she had significant drop from her preoperative level of 13.2 and it was felt that she would benefit from undergoing a blood with her rehab.  She was given 2 units and tolerated the blood well.  She was placed back on her blood pressure medications and those were restarted postoperatively.  She was started on Coumadin for DVT prophylaxis.  She actually did really well, walking on day #1 about 50 feet and by day #2 she was walking over 100 feet.  Dressings were changed on day #2 and incision looked good.  The patient had a little bit of confusion on the late evening and morning of day #2, and we felt that it was  a combination of her narcotic pain pill, the oxycodone, and also the sleeping pills she was given.  We stopped the sleeping pill and decreased her oxycodone to Ultram which she had been on past and has done well with it.  She did very well walking over 110 feet on that day.  By the morning of day #3, she was seen in rounds by Dr. Wynelle Link, tolerating the Ultram, doing well, and discharged to [facility].  DISCHARGE/PLAN: 1. The patient will be transferred to [facility] on August 29, 2010. 2. Discharge diagnoses, please see above. 3. Discharge medications.  CURRENT MEDICATIONS AT THE TIME OF TRANSFER:  Include, 1. Coumadin protocol.  Please titrate the Coumadin level for target     INR between 2.0 and 3.0.  She is to be on Coumadin for 3 weeks from     the date of surgery and then discontinue the Coumadin. 2. Colace 100 mg p.o. b.i.d. 3. MiraLax 17 g ams in 8 ounces water p.o. at bedtime, hold if loose     stool or diarrhea. 4. Toprol XL 100 mg p.o. every morning. 5. Losartan 100 mg p.o. every morning. 6. Diltiazem CD 240 mg every morning. 7. Clonidine 0.3 mg p.o. t.i.d. 8. Ocuvite vitamin daily. 9. Lasix 80 mg daily every morning. 10.Robaxin 500 mg p.o. q.6 hours to 8 hours p.r.n. spasm. 11.Tylenol 325 one or two every 4 hours to 6 hours as needed for mild     pain, temperature, or headache. 12.Tramadol 50 mg 1 to 2 tablets every 6 hours as needed for moderate     pain.  DIET:  Heart-healthy diet.  ACTIVITY:  She is weightbearing as tolerated.  Total knee protocol.  PT and OT for gait training, ambulation, ADLs, range of motion, and strengthening exercises.  The patient may start showering; however, do not submerge incision under water.  FOLLOWUP:  She needs to follow up Dr. Wynelle Link in the office 2 weeks from the date of surgery.  Please contact the office of [phone] to arrange appointment and follow-up care.  DISPOSITION:  [facility].  CONDITION ON DISCHARGE:   Improved.     Alexzandrew L. Dara Lords, P.A.C.   ______________________________ Gaynelle Arabian, M.D.    ALP/MEDQ  D:  08/29/2010  T:  08/29/2010  Job:  [id number]  cc:   Delmar Landau) Harrington Challenger, M.D. Fax: [place]. Rankins, M.D. Fax: [phone]  Electronically Signed by Mickel Crow P.A.C. on 09/02/2010 04:07:41 PM Electronically Signed by Gaynelle Arabian M.D. on 09/05/2010 04:03:48 PM

## 2010-09-30 LAB — BASIC METABOLIC PANEL
CO2: 27
Calcium: 8.4
Creatinine, Ser: 1.46 — ABNORMAL HIGH
GFR calc Af Amer: 42 — ABNORMAL LOW
Sodium: 145

## 2010-09-30 LAB — URINE MICROSCOPIC-ADD ON

## 2010-09-30 LAB — CBC
Hemoglobin: 12.5
MCHC: 33
Platelets: 323
RBC: 4.28
RBC: 4.73
WBC: 16.8 — ABNORMAL HIGH
WBC: 17.4 — ABNORMAL HIGH

## 2010-09-30 LAB — COMPREHENSIVE METABOLIC PANEL
ALT: 20
AST: 39 — ABNORMAL HIGH
Albumin: 3.4 — ABNORMAL LOW
CO2: 27
Chloride: 98
GFR calc Af Amer: 34 — ABNORMAL LOW
GFR calc non Af Amer: 28 — ABNORMAL LOW
Potassium: 2.8 — ABNORMAL LOW
Sodium: 141
Total Bilirubin: 1

## 2010-09-30 LAB — URINALYSIS, ROUTINE W REFLEX MICROSCOPIC
Bilirubin Urine: NEGATIVE
Glucose, UA: NEGATIVE
Protein, ur: 30 — AB
Urobilinogen, UA: 1

## 2010-12-03 ENCOUNTER — Other Ambulatory Visit: Payer: Self-pay | Admitting: Family Medicine

## 2010-12-03 DIAGNOSIS — Z9012 Acquired absence of left breast and nipple: Secondary | ICD-10-CM

## 2010-12-03 DIAGNOSIS — Z1231 Encounter for screening mammogram for malignant neoplasm of breast: Secondary | ICD-10-CM

## 2011-01-07 HISTORY — PX: KNEE SURGERY: SHX244

## 2011-01-09 ENCOUNTER — Ambulatory Visit
Admission: RE | Admit: 2011-01-09 | Discharge: 2011-01-09 | Disposition: A | Discharge status: Home / Self Care | Payer: Medicare Other | Source: Ambulatory Visit | Attending: Family Medicine | Admitting: Family Medicine

## 2011-01-09 DIAGNOSIS — Z1231 Encounter for screening mammogram for malignant neoplasm of breast: Secondary | ICD-10-CM | POA: Diagnosis not present

## 2011-01-09 DIAGNOSIS — Z9012 Acquired absence of left breast and nipple: Secondary | ICD-10-CM

## 2011-01-15 DIAGNOSIS — H35059 Retinal neovascularization, unspecified, unspecified eye: Secondary | ICD-10-CM | POA: Diagnosis not present

## 2011-01-15 DIAGNOSIS — H353290 Exudative age-related macular degeneration, unspecified eye, stage unspecified: Secondary | ICD-10-CM | POA: Diagnosis not present

## 2011-02-19 DIAGNOSIS — H35059 Retinal neovascularization, unspecified, unspecified eye: Secondary | ICD-10-CM | POA: Diagnosis not present

## 2011-02-19 DIAGNOSIS — H353290 Exudative age-related macular degeneration, unspecified eye, stage unspecified: Secondary | ICD-10-CM | POA: Diagnosis not present

## 2011-02-20 DIAGNOSIS — M1710 Unilateral primary osteoarthritis, unspecified knee: Secondary | ICD-10-CM | POA: Diagnosis not present

## 2011-03-27 DIAGNOSIS — H353290 Exudative age-related macular degeneration, unspecified eye, stage unspecified: Secondary | ICD-10-CM | POA: Diagnosis not present

## 2011-03-27 DIAGNOSIS — H35059 Retinal neovascularization, unspecified, unspecified eye: Secondary | ICD-10-CM | POA: Diagnosis not present

## 2011-05-07 DIAGNOSIS — H353290 Exudative age-related macular degeneration, unspecified eye, stage unspecified: Secondary | ICD-10-CM | POA: Diagnosis not present

## 2011-05-07 DIAGNOSIS — H35059 Retinal neovascularization, unspecified, unspecified eye: Secondary | ICD-10-CM | POA: Diagnosis not present

## 2011-05-15 DIAGNOSIS — H353290 Exudative age-related macular degeneration, unspecified eye, stage unspecified: Secondary | ICD-10-CM | POA: Diagnosis not present

## 2011-05-15 DIAGNOSIS — H35059 Retinal neovascularization, unspecified, unspecified eye: Secondary | ICD-10-CM | POA: Diagnosis not present

## 2011-06-10 DIAGNOSIS — H35059 Retinal neovascularization, unspecified, unspecified eye: Secondary | ICD-10-CM | POA: Diagnosis not present

## 2011-06-10 DIAGNOSIS — H353290 Exudative age-related macular degeneration, unspecified eye, stage unspecified: Secondary | ICD-10-CM | POA: Diagnosis not present

## 2011-06-23 DIAGNOSIS — H35059 Retinal neovascularization, unspecified, unspecified eye: Secondary | ICD-10-CM | POA: Diagnosis not present

## 2011-06-23 DIAGNOSIS — H353290 Exudative age-related macular degeneration, unspecified eye, stage unspecified: Secondary | ICD-10-CM | POA: Diagnosis not present

## 2011-07-17 DIAGNOSIS — H353290 Exudative age-related macular degeneration, unspecified eye, stage unspecified: Secondary | ICD-10-CM | POA: Diagnosis not present

## 2011-07-17 DIAGNOSIS — H35059 Retinal neovascularization, unspecified, unspecified eye: Secondary | ICD-10-CM | POA: Diagnosis not present

## 2011-07-23 DIAGNOSIS — H353290 Exudative age-related macular degeneration, unspecified eye, stage unspecified: Secondary | ICD-10-CM | POA: Diagnosis not present

## 2011-07-23 DIAGNOSIS — H35059 Retinal neovascularization, unspecified, unspecified eye: Secondary | ICD-10-CM | POA: Diagnosis not present

## 2011-08-25 DIAGNOSIS — H3570 Unspecified separation of retinal layers: Secondary | ICD-10-CM | POA: Diagnosis not present

## 2011-08-25 DIAGNOSIS — H35059 Retinal neovascularization, unspecified, unspecified eye: Secondary | ICD-10-CM | POA: Diagnosis not present

## 2011-08-25 DIAGNOSIS — H353290 Exudative age-related macular degeneration, unspecified eye, stage unspecified: Secondary | ICD-10-CM | POA: Diagnosis not present

## 2011-08-26 DIAGNOSIS — H353290 Exudative age-related macular degeneration, unspecified eye, stage unspecified: Secondary | ICD-10-CM | POA: Diagnosis not present

## 2011-08-26 DIAGNOSIS — H35059 Retinal neovascularization, unspecified, unspecified eye: Secondary | ICD-10-CM | POA: Diagnosis not present

## 2011-08-28 DIAGNOSIS — M1710 Unilateral primary osteoarthritis, unspecified knee: Secondary | ICD-10-CM | POA: Diagnosis not present

## 2011-09-04 DIAGNOSIS — I10 Essential (primary) hypertension: Secondary | ICD-10-CM | POA: Diagnosis not present

## 2011-09-04 DIAGNOSIS — G479 Sleep disorder, unspecified: Secondary | ICD-10-CM | POA: Diagnosis not present

## 2011-09-29 DIAGNOSIS — H353290 Exudative age-related macular degeneration, unspecified eye, stage unspecified: Secondary | ICD-10-CM | POA: Diagnosis not present

## 2011-09-29 DIAGNOSIS — H35729 Serous detachment of retinal pigment epithelium, unspecified eye: Secondary | ICD-10-CM | POA: Diagnosis not present

## 2011-09-30 DIAGNOSIS — H35059 Retinal neovascularization, unspecified, unspecified eye: Secondary | ICD-10-CM | POA: Diagnosis not present

## 2011-09-30 DIAGNOSIS — H353290 Exudative age-related macular degeneration, unspecified eye, stage unspecified: Secondary | ICD-10-CM | POA: Diagnosis not present

## 2011-10-21 DIAGNOSIS — D0420 Carcinoma in situ of skin of unspecified ear and external auricular canal: Secondary | ICD-10-CM | POA: Diagnosis not present

## 2011-10-21 DIAGNOSIS — L570 Actinic keratosis: Secondary | ICD-10-CM | POA: Diagnosis not present

## 2011-10-21 DIAGNOSIS — D485 Neoplasm of uncertain behavior of skin: Secondary | ICD-10-CM | POA: Diagnosis not present

## 2011-10-21 DIAGNOSIS — C44320 Squamous cell carcinoma of skin of unspecified parts of face: Secondary | ICD-10-CM | POA: Diagnosis not present

## 2011-11-03 DIAGNOSIS — H353290 Exudative age-related macular degeneration, unspecified eye, stage unspecified: Secondary | ICD-10-CM | POA: Diagnosis not present

## 2011-11-04 DIAGNOSIS — H353290 Exudative age-related macular degeneration, unspecified eye, stage unspecified: Secondary | ICD-10-CM | POA: Diagnosis not present

## 2011-11-05 DIAGNOSIS — L98499 Non-pressure chronic ulcer of skin of other sites with unspecified severity: Secondary | ICD-10-CM | POA: Diagnosis not present

## 2011-11-11 DIAGNOSIS — L905 Scar conditions and fibrosis of skin: Secondary | ICD-10-CM | POA: Diagnosis not present

## 2011-11-11 DIAGNOSIS — L570 Actinic keratosis: Secondary | ICD-10-CM | POA: Diagnosis not present

## 2011-11-11 DIAGNOSIS — C44621 Squamous cell carcinoma of skin of unspecified upper limb, including shoulder: Secondary | ICD-10-CM | POA: Diagnosis not present

## 2011-11-11 DIAGNOSIS — D0420 Carcinoma in situ of skin of unspecified ear and external auricular canal: Secondary | ICD-10-CM | POA: Diagnosis not present

## 2011-11-11 DIAGNOSIS — C4499 Other specified malignant neoplasm of skin, unspecified: Secondary | ICD-10-CM | POA: Diagnosis not present

## 2011-11-26 DIAGNOSIS — Z23 Encounter for immunization: Secondary | ICD-10-CM | POA: Diagnosis not present

## 2011-12-08 DIAGNOSIS — H353290 Exudative age-related macular degeneration, unspecified eye, stage unspecified: Secondary | ICD-10-CM | POA: Diagnosis not present

## 2011-12-08 DIAGNOSIS — H35059 Retinal neovascularization, unspecified, unspecified eye: Secondary | ICD-10-CM | POA: Diagnosis not present

## 2011-12-09 DIAGNOSIS — H353290 Exudative age-related macular degeneration, unspecified eye, stage unspecified: Secondary | ICD-10-CM | POA: Diagnosis not present

## 2011-12-11 ENCOUNTER — Other Ambulatory Visit: Payer: Self-pay | Admitting: Obstetrics and Gynecology

## 2011-12-11 DIAGNOSIS — Z1231 Encounter for screening mammogram for malignant neoplasm of breast: Secondary | ICD-10-CM

## 2012-01-14 DIAGNOSIS — H35059 Retinal neovascularization, unspecified, unspecified eye: Secondary | ICD-10-CM | POA: Diagnosis not present

## 2012-01-14 DIAGNOSIS — H353290 Exudative age-related macular degeneration, unspecified eye, stage unspecified: Secondary | ICD-10-CM | POA: Diagnosis not present

## 2012-01-14 DIAGNOSIS — H35359 Cystoid macular degeneration, unspecified eye: Secondary | ICD-10-CM | POA: Diagnosis not present

## 2012-01-15 ENCOUNTER — Ambulatory Visit
Admission: RE | Admit: 2012-01-15 | Discharge: 2012-01-15 | Disposition: A | Discharge status: Home / Self Care | Payer: Medicare Other | Source: Ambulatory Visit | Attending: Obstetrics and Gynecology | Admitting: Obstetrics and Gynecology

## 2012-01-15 DIAGNOSIS — Z1231 Encounter for screening mammogram for malignant neoplasm of breast: Secondary | ICD-10-CM | POA: Diagnosis not present

## 2012-01-16 DIAGNOSIS — H353290 Exudative age-related macular degeneration, unspecified eye, stage unspecified: Secondary | ICD-10-CM | POA: Diagnosis not present

## 2012-01-16 DIAGNOSIS — H35059 Retinal neovascularization, unspecified, unspecified eye: Secondary | ICD-10-CM | POA: Diagnosis not present

## 2012-01-20 DIAGNOSIS — C44621 Squamous cell carcinoma of skin of unspecified upper limb, including shoulder: Secondary | ICD-10-CM | POA: Diagnosis not present

## 2012-01-20 DIAGNOSIS — C44320 Squamous cell carcinoma of skin of unspecified parts of face: Secondary | ICD-10-CM | POA: Diagnosis not present

## 2012-01-20 DIAGNOSIS — L570 Actinic keratosis: Secondary | ICD-10-CM | POA: Diagnosis not present

## 2012-01-20 DIAGNOSIS — L905 Scar conditions and fibrosis of skin: Secondary | ICD-10-CM | POA: Diagnosis not present

## 2012-02-16 DIAGNOSIS — H353290 Exudative age-related macular degeneration, unspecified eye, stage unspecified: Secondary | ICD-10-CM | POA: Diagnosis not present

## 2012-02-16 DIAGNOSIS — H35059 Retinal neovascularization, unspecified, unspecified eye: Secondary | ICD-10-CM | POA: Diagnosis not present

## 2012-02-27 DIAGNOSIS — H353290 Exudative age-related macular degeneration, unspecified eye, stage unspecified: Secondary | ICD-10-CM | POA: Diagnosis not present

## 2012-03-30 DIAGNOSIS — H35059 Retinal neovascularization, unspecified, unspecified eye: Secondary | ICD-10-CM | POA: Diagnosis not present

## 2012-03-30 DIAGNOSIS — H353290 Exudative age-related macular degeneration, unspecified eye, stage unspecified: Secondary | ICD-10-CM | POA: Diagnosis not present

## 2012-04-01 DIAGNOSIS — H353290 Exudative age-related macular degeneration, unspecified eye, stage unspecified: Secondary | ICD-10-CM | POA: Diagnosis not present

## 2012-04-01 DIAGNOSIS — H35059 Retinal neovascularization, unspecified, unspecified eye: Secondary | ICD-10-CM | POA: Diagnosis not present

## 2012-05-03 DIAGNOSIS — H35059 Retinal neovascularization, unspecified, unspecified eye: Secondary | ICD-10-CM | POA: Diagnosis not present

## 2012-05-03 DIAGNOSIS — H353290 Exudative age-related macular degeneration, unspecified eye, stage unspecified: Secondary | ICD-10-CM | POA: Diagnosis not present

## 2012-05-13 DIAGNOSIS — H35059 Retinal neovascularization, unspecified, unspecified eye: Secondary | ICD-10-CM | POA: Diagnosis not present

## 2012-05-13 DIAGNOSIS — H353290 Exudative age-related macular degeneration, unspecified eye, stage unspecified: Secondary | ICD-10-CM | POA: Diagnosis not present

## 2012-06-15 DIAGNOSIS — H35059 Retinal neovascularization, unspecified, unspecified eye: Secondary | ICD-10-CM | POA: Diagnosis not present

## 2012-06-15 DIAGNOSIS — H353290 Exudative age-related macular degeneration, unspecified eye, stage unspecified: Secondary | ICD-10-CM | POA: Diagnosis not present

## 2012-06-29 DIAGNOSIS — H353290 Exudative age-related macular degeneration, unspecified eye, stage unspecified: Secondary | ICD-10-CM | POA: Diagnosis not present

## 2012-06-29 DIAGNOSIS — H35359 Cystoid macular degeneration, unspecified eye: Secondary | ICD-10-CM | POA: Diagnosis not present

## 2012-07-13 DIAGNOSIS — H3530 Unspecified macular degeneration: Secondary | ICD-10-CM | POA: Diagnosis not present

## 2012-07-13 DIAGNOSIS — H35419 Lattice degeneration of retina, unspecified eye: Secondary | ICD-10-CM | POA: Diagnosis not present

## 2012-07-13 DIAGNOSIS — H1045 Other chronic allergic conjunctivitis: Secondary | ICD-10-CM | POA: Diagnosis not present

## 2012-07-13 DIAGNOSIS — H04129 Dry eye syndrome of unspecified lacrimal gland: Secondary | ICD-10-CM | POA: Diagnosis not present

## 2012-07-20 DIAGNOSIS — H353290 Exudative age-related macular degeneration, unspecified eye, stage unspecified: Secondary | ICD-10-CM | POA: Diagnosis not present

## 2012-07-20 DIAGNOSIS — H35059 Retinal neovascularization, unspecified, unspecified eye: Secondary | ICD-10-CM | POA: Diagnosis not present

## 2012-07-20 DIAGNOSIS — H35359 Cystoid macular degeneration, unspecified eye: Secondary | ICD-10-CM | POA: Diagnosis not present

## 2012-08-10 DIAGNOSIS — H3570 Unspecified separation of retinal layers: Secondary | ICD-10-CM | POA: Diagnosis not present

## 2012-08-10 DIAGNOSIS — H35359 Cystoid macular degeneration, unspecified eye: Secondary | ICD-10-CM | POA: Diagnosis not present

## 2012-08-10 DIAGNOSIS — H353290 Exudative age-related macular degeneration, unspecified eye, stage unspecified: Secondary | ICD-10-CM | POA: Diagnosis not present

## 2012-08-31 DIAGNOSIS — H353290 Exudative age-related macular degeneration, unspecified eye, stage unspecified: Secondary | ICD-10-CM | POA: Diagnosis not present

## 2012-08-31 DIAGNOSIS — H35059 Retinal neovascularization, unspecified, unspecified eye: Secondary | ICD-10-CM | POA: Diagnosis not present

## 2012-08-31 DIAGNOSIS — H35359 Cystoid macular degeneration, unspecified eye: Secondary | ICD-10-CM | POA: Diagnosis not present

## 2012-09-28 DIAGNOSIS — H35059 Retinal neovascularization, unspecified, unspecified eye: Secondary | ICD-10-CM | POA: Diagnosis not present

## 2012-09-28 DIAGNOSIS — H353290 Exudative age-related macular degeneration, unspecified eye, stage unspecified: Secondary | ICD-10-CM | POA: Diagnosis not present

## 2012-10-05 DIAGNOSIS — H353290 Exudative age-related macular degeneration, unspecified eye, stage unspecified: Secondary | ICD-10-CM | POA: Diagnosis not present

## 2012-10-05 DIAGNOSIS — H35059 Retinal neovascularization, unspecified, unspecified eye: Secondary | ICD-10-CM | POA: Diagnosis not present

## 2012-11-02 DIAGNOSIS — G479 Sleep disorder, unspecified: Secondary | ICD-10-CM | POA: Diagnosis not present

## 2012-11-02 DIAGNOSIS — I10 Essential (primary) hypertension: Secondary | ICD-10-CM | POA: Diagnosis not present

## 2012-11-02 DIAGNOSIS — Z23 Encounter for immunization: Secondary | ICD-10-CM | POA: Diagnosis not present

## 2012-11-09 DIAGNOSIS — H35059 Retinal neovascularization, unspecified, unspecified eye: Secondary | ICD-10-CM | POA: Diagnosis not present

## 2012-11-09 DIAGNOSIS — H35359 Cystoid macular degeneration, unspecified eye: Secondary | ICD-10-CM | POA: Diagnosis not present

## 2012-11-09 DIAGNOSIS — H353290 Exudative age-related macular degeneration, unspecified eye, stage unspecified: Secondary | ICD-10-CM | POA: Diagnosis not present

## 2012-11-16 DIAGNOSIS — Z8614 Personal history of Methicillin resistant Staphylococcus aureus infection: Secondary | ICD-10-CM | POA: Diagnosis not present

## 2012-11-16 DIAGNOSIS — L02419 Cutaneous abscess of limb, unspecified: Secondary | ICD-10-CM | POA: Diagnosis not present

## 2012-11-23 DIAGNOSIS — H353290 Exudative age-related macular degeneration, unspecified eye, stage unspecified: Secondary | ICD-10-CM | POA: Diagnosis not present

## 2012-11-23 DIAGNOSIS — H35059 Retinal neovascularization, unspecified, unspecified eye: Secondary | ICD-10-CM | POA: Diagnosis not present

## 2012-11-26 DIAGNOSIS — L02419 Cutaneous abscess of limb, unspecified: Secondary | ICD-10-CM | POA: Diagnosis not present

## 2012-11-26 DIAGNOSIS — L03119 Cellulitis of unspecified part of limb: Secondary | ICD-10-CM | POA: Diagnosis not present

## 2012-12-21 DIAGNOSIS — H35059 Retinal neovascularization, unspecified, unspecified eye: Secondary | ICD-10-CM | POA: Diagnosis not present

## 2012-12-21 DIAGNOSIS — H353290 Exudative age-related macular degeneration, unspecified eye, stage unspecified: Secondary | ICD-10-CM | POA: Diagnosis not present

## 2013-01-14 ENCOUNTER — Other Ambulatory Visit: Payer: Self-pay

## 2013-01-14 DIAGNOSIS — Z9012 Acquired absence of left breast and nipple: Secondary | ICD-10-CM

## 2013-01-14 DIAGNOSIS — Z1231 Encounter for screening mammogram for malignant neoplasm of breast: Secondary | ICD-10-CM

## 2013-01-18 DIAGNOSIS — H35059 Retinal neovascularization, unspecified, unspecified eye: Secondary | ICD-10-CM | POA: Diagnosis not present

## 2013-01-18 DIAGNOSIS — H353290 Exudative age-related macular degeneration, unspecified eye, stage unspecified: Secondary | ICD-10-CM | POA: Diagnosis not present

## 2013-02-01 DIAGNOSIS — H35059 Retinal neovascularization, unspecified, unspecified eye: Secondary | ICD-10-CM | POA: Diagnosis not present

## 2013-02-01 DIAGNOSIS — H353290 Exudative age-related macular degeneration, unspecified eye, stage unspecified: Secondary | ICD-10-CM | POA: Diagnosis not present

## 2013-02-01 DIAGNOSIS — H35359 Cystoid macular degeneration, unspecified eye: Secondary | ICD-10-CM | POA: Diagnosis not present

## 2013-02-02 ENCOUNTER — Ambulatory Visit
Admission: RE | Admit: 2013-02-02 | Discharge: 2013-02-02 | Disposition: A | Discharge status: Home / Self Care | Payer: Medicare Other | Source: Ambulatory Visit

## 2013-02-02 DIAGNOSIS — Z1231 Encounter for screening mammogram for malignant neoplasm of breast: Secondary | ICD-10-CM | POA: Diagnosis not present

## 2013-02-02 DIAGNOSIS — Z9012 Acquired absence of left breast and nipple: Secondary | ICD-10-CM

## 2013-03-15 DIAGNOSIS — H353290 Exudative age-related macular degeneration, unspecified eye, stage unspecified: Secondary | ICD-10-CM | POA: Diagnosis not present

## 2013-03-15 DIAGNOSIS — H35359 Cystoid macular degeneration, unspecified eye: Secondary | ICD-10-CM | POA: Diagnosis not present

## 2013-03-15 DIAGNOSIS — H35059 Retinal neovascularization, unspecified, unspecified eye: Secondary | ICD-10-CM | POA: Diagnosis not present

## 2013-03-22 DIAGNOSIS — H35059 Retinal neovascularization, unspecified, unspecified eye: Secondary | ICD-10-CM | POA: Diagnosis not present

## 2013-03-22 DIAGNOSIS — H353290 Exudative age-related macular degeneration, unspecified eye, stage unspecified: Secondary | ICD-10-CM | POA: Diagnosis not present

## 2013-04-26 DIAGNOSIS — H353290 Exudative age-related macular degeneration, unspecified eye, stage unspecified: Secondary | ICD-10-CM | POA: Diagnosis not present

## 2013-04-26 DIAGNOSIS — H35059 Retinal neovascularization, unspecified, unspecified eye: Secondary | ICD-10-CM | POA: Diagnosis not present

## 2013-06-01 DIAGNOSIS — H353290 Exudative age-related macular degeneration, unspecified eye, stage unspecified: Secondary | ICD-10-CM | POA: Diagnosis not present

## 2013-06-01 DIAGNOSIS — H35359 Cystoid macular degeneration, unspecified eye: Secondary | ICD-10-CM | POA: Diagnosis not present

## 2013-06-01 DIAGNOSIS — H35059 Retinal neovascularization, unspecified, unspecified eye: Secondary | ICD-10-CM | POA: Diagnosis not present

## 2013-06-18 DIAGNOSIS — J069 Acute upper respiratory infection, unspecified: Secondary | ICD-10-CM | POA: Diagnosis not present

## 2013-06-28 DIAGNOSIS — H353290 Exudative age-related macular degeneration, unspecified eye, stage unspecified: Secondary | ICD-10-CM | POA: Diagnosis not present

## 2013-06-28 DIAGNOSIS — H35059 Retinal neovascularization, unspecified, unspecified eye: Secondary | ICD-10-CM | POA: Diagnosis not present

## 2013-08-11 DIAGNOSIS — H353290 Exudative age-related macular degeneration, unspecified eye, stage unspecified: Secondary | ICD-10-CM | POA: Diagnosis not present

## 2013-08-11 DIAGNOSIS — H35059 Retinal neovascularization, unspecified, unspecified eye: Secondary | ICD-10-CM | POA: Diagnosis not present

## 2013-08-30 DIAGNOSIS — H35059 Retinal neovascularization, unspecified, unspecified eye: Secondary | ICD-10-CM | POA: Diagnosis not present

## 2013-08-30 DIAGNOSIS — H353290 Exudative age-related macular degeneration, unspecified eye, stage unspecified: Secondary | ICD-10-CM | POA: Diagnosis not present

## 2013-08-30 DIAGNOSIS — H35359 Cystoid macular degeneration, unspecified eye: Secondary | ICD-10-CM | POA: Diagnosis not present

## 2013-10-04 DIAGNOSIS — H35369 Drusen (degenerative) of macula, unspecified eye: Secondary | ICD-10-CM | POA: Diagnosis not present

## 2013-10-04 DIAGNOSIS — H35379 Puckering of macula, unspecified eye: Secondary | ICD-10-CM | POA: Diagnosis not present

## 2013-10-04 DIAGNOSIS — H353290 Exudative age-related macular degeneration, unspecified eye, stage unspecified: Secondary | ICD-10-CM | POA: Diagnosis not present

## 2013-10-04 DIAGNOSIS — H35359 Cystoid macular degeneration, unspecified eye: Secondary | ICD-10-CM | POA: Diagnosis not present

## 2013-11-01 DIAGNOSIS — G479 Sleep disorder, unspecified: Secondary | ICD-10-CM | POA: Diagnosis not present

## 2013-11-01 DIAGNOSIS — M179 Osteoarthritis of knee, unspecified: Secondary | ICD-10-CM | POA: Diagnosis not present

## 2013-11-01 DIAGNOSIS — I10 Essential (primary) hypertension: Secondary | ICD-10-CM | POA: Diagnosis not present

## 2013-11-01 DIAGNOSIS — Z23 Encounter for immunization: Secondary | ICD-10-CM | POA: Diagnosis not present

## 2013-11-01 DIAGNOSIS — K219 Gastro-esophageal reflux disease without esophagitis: Secondary | ICD-10-CM | POA: Diagnosis not present

## 2013-11-01 DIAGNOSIS — D649 Anemia, unspecified: Secondary | ICD-10-CM | POA: Diagnosis not present

## 2013-11-01 DIAGNOSIS — M353 Polymyalgia rheumatica: Secondary | ICD-10-CM | POA: Diagnosis not present

## 2013-11-01 DIAGNOSIS — E781 Pure hyperglyceridemia: Secondary | ICD-10-CM | POA: Diagnosis not present

## 2013-11-08 DIAGNOSIS — H3532 Exudative age-related macular degeneration: Secondary | ICD-10-CM | POA: Diagnosis not present

## 2013-11-08 DIAGNOSIS — H35051 Retinal neovascularization, unspecified, right eye: Secondary | ICD-10-CM | POA: Diagnosis not present

## 2013-12-08 DIAGNOSIS — H3532 Exudative age-related macular degeneration: Secondary | ICD-10-CM | POA: Diagnosis not present

## 2013-12-13 DIAGNOSIS — H3532 Exudative age-related macular degeneration: Secondary | ICD-10-CM | POA: Diagnosis not present

## 2013-12-13 DIAGNOSIS — H35051 Retinal neovascularization, unspecified, right eye: Secondary | ICD-10-CM | POA: Diagnosis not present

## 2014-01-19 DIAGNOSIS — H35052 Retinal neovascularization, unspecified, left eye: Secondary | ICD-10-CM | POA: Diagnosis not present

## 2014-01-19 DIAGNOSIS — H3532 Exudative age-related macular degeneration: Secondary | ICD-10-CM | POA: Diagnosis not present

## 2014-01-24 DIAGNOSIS — H35052 Retinal neovascularization, unspecified, left eye: Secondary | ICD-10-CM | POA: Diagnosis not present

## 2014-01-24 DIAGNOSIS — H3532 Exudative age-related macular degeneration: Secondary | ICD-10-CM | POA: Diagnosis not present

## 2014-02-13 ENCOUNTER — Other Ambulatory Visit: Payer: Self-pay

## 2014-02-13 DIAGNOSIS — Z1231 Encounter for screening mammogram for malignant neoplasm of breast: Secondary | ICD-10-CM

## 2014-02-16 ENCOUNTER — Ambulatory Visit
Admission: RE | Admit: 2014-02-16 | Discharge: 2014-02-16 | Disposition: A | Discharge status: Home / Self Care | Payer: Medicare Other | Source: Ambulatory Visit

## 2014-02-16 DIAGNOSIS — Z1231 Encounter for screening mammogram for malignant neoplasm of breast: Secondary | ICD-10-CM

## 2014-02-17 ENCOUNTER — Other Ambulatory Visit: Payer: Self-pay | Admitting: Obstetrics and Gynecology

## 2014-02-17 DIAGNOSIS — R928 Other abnormal and inconclusive findings on diagnostic imaging of breast: Secondary | ICD-10-CM

## 2014-02-27 ENCOUNTER — Other Ambulatory Visit: Payer: Self-pay | Admitting: Family Medicine

## 2014-02-27 DIAGNOSIS — R928 Other abnormal and inconclusive findings on diagnostic imaging of breast: Secondary | ICD-10-CM

## 2014-02-28 ENCOUNTER — Ambulatory Visit
Admission: RE | Admit: 2014-02-28 | Discharge: 2014-02-28 | Disposition: A | Discharge status: Home / Self Care | Payer: Medicare Other | Source: Ambulatory Visit | Attending: Obstetrics and Gynecology | Admitting: Obstetrics and Gynecology

## 2014-02-28 DIAGNOSIS — R928 Other abnormal and inconclusive findings on diagnostic imaging of breast: Secondary | ICD-10-CM

## 2014-02-28 DIAGNOSIS — N6011 Diffuse cystic mastopathy of right breast: Secondary | ICD-10-CM | POA: Diagnosis not present

## 2014-03-02 DIAGNOSIS — H35052 Retinal neovascularization, unspecified, left eye: Secondary | ICD-10-CM | POA: Diagnosis not present

## 2014-03-02 DIAGNOSIS — H3532 Exudative age-related macular degeneration: Secondary | ICD-10-CM | POA: Diagnosis not present

## 2014-03-28 DIAGNOSIS — H3532 Exudative age-related macular degeneration: Secondary | ICD-10-CM | POA: Diagnosis not present

## 2014-04-18 DIAGNOSIS — H3532 Exudative age-related macular degeneration: Secondary | ICD-10-CM | POA: Diagnosis not present

## 2014-05-04 DIAGNOSIS — I10 Essential (primary) hypertension: Secondary | ICD-10-CM | POA: Diagnosis not present

## 2014-05-04 DIAGNOSIS — G479 Sleep disorder, unspecified: Secondary | ICD-10-CM | POA: Diagnosis not present

## 2014-05-23 DIAGNOSIS — H3532 Exudative age-related macular degeneration: Secondary | ICD-10-CM | POA: Diagnosis not present

## 2014-06-13 DIAGNOSIS — H3532 Exudative age-related macular degeneration: Secondary | ICD-10-CM | POA: Diagnosis not present

## 2014-06-27 DIAGNOSIS — L814 Other melanin hyperpigmentation: Secondary | ICD-10-CM | POA: Diagnosis not present

## 2014-06-27 DIAGNOSIS — D225 Melanocytic nevi of trunk: Secondary | ICD-10-CM | POA: Diagnosis not present

## 2014-06-27 DIAGNOSIS — C44319 Basal cell carcinoma of skin of other parts of face: Secondary | ICD-10-CM | POA: Diagnosis not present

## 2014-06-27 DIAGNOSIS — C44719 Basal cell carcinoma of skin of left lower limb, including hip: Secondary | ICD-10-CM | POA: Diagnosis not present

## 2014-06-27 DIAGNOSIS — C44729 Squamous cell carcinoma of skin of left lower limb, including hip: Secondary | ICD-10-CM | POA: Diagnosis not present

## 2014-06-27 DIAGNOSIS — L570 Actinic keratosis: Secondary | ICD-10-CM | POA: Diagnosis not present

## 2014-06-27 DIAGNOSIS — D1801 Hemangioma of skin and subcutaneous tissue: Secondary | ICD-10-CM | POA: Diagnosis not present

## 2014-06-27 DIAGNOSIS — D224 Melanocytic nevi of scalp and neck: Secondary | ICD-10-CM | POA: Diagnosis not present

## 2014-06-27 DIAGNOSIS — Z85828 Personal history of other malignant neoplasm of skin: Secondary | ICD-10-CM | POA: Diagnosis not present

## 2014-06-27 DIAGNOSIS — D485 Neoplasm of uncertain behavior of skin: Secondary | ICD-10-CM | POA: Diagnosis not present

## 2014-06-27 DIAGNOSIS — L821 Other seborrheic keratosis: Secondary | ICD-10-CM | POA: Diagnosis not present

## 2014-06-27 DIAGNOSIS — C44712 Basal cell carcinoma of skin of right lower limb, including hip: Secondary | ICD-10-CM | POA: Diagnosis not present

## 2014-07-04 DIAGNOSIS — T814XXA Infection following a procedure, initial encounter: Secondary | ICD-10-CM | POA: Diagnosis not present

## 2014-07-25 DIAGNOSIS — H3532 Exudative age-related macular degeneration: Secondary | ICD-10-CM | POA: Diagnosis not present

## 2014-07-27 DIAGNOSIS — H3532 Exudative age-related macular degeneration: Secondary | ICD-10-CM | POA: Diagnosis not present

## 2014-08-15 DIAGNOSIS — C44319 Basal cell carcinoma of skin of other parts of face: Secondary | ICD-10-CM | POA: Diagnosis not present

## 2014-08-22 DIAGNOSIS — C44712 Basal cell carcinoma of skin of right lower limb, including hip: Secondary | ICD-10-CM | POA: Diagnosis not present

## 2014-08-22 DIAGNOSIS — C44719 Basal cell carcinoma of skin of left lower limb, including hip: Secondary | ICD-10-CM | POA: Diagnosis not present

## 2014-08-22 DIAGNOSIS — L570 Actinic keratosis: Secondary | ICD-10-CM | POA: Diagnosis not present

## 2014-08-30 DIAGNOSIS — L03818 Cellulitis of other sites: Secondary | ICD-10-CM | POA: Diagnosis not present

## 2014-09-05 DIAGNOSIS — L905 Scar conditions and fibrosis of skin: Secondary | ICD-10-CM | POA: Diagnosis not present

## 2014-09-05 DIAGNOSIS — C44729 Squamous cell carcinoma of skin of left lower limb, including hip: Secondary | ICD-10-CM | POA: Diagnosis not present

## 2014-09-19 DIAGNOSIS — H3532 Exudative age-related macular degeneration: Secondary | ICD-10-CM | POA: Diagnosis not present

## 2014-09-26 DIAGNOSIS — H3532 Exudative age-related macular degeneration: Secondary | ICD-10-CM | POA: Diagnosis not present

## 2014-11-08 DIAGNOSIS — Z23 Encounter for immunization: Secondary | ICD-10-CM | POA: Diagnosis not present

## 2014-11-08 DIAGNOSIS — F411 Generalized anxiety disorder: Secondary | ICD-10-CM | POA: Diagnosis not present

## 2014-11-08 DIAGNOSIS — I10 Essential (primary) hypertension: Secondary | ICD-10-CM | POA: Diagnosis not present

## 2014-11-17 DIAGNOSIS — R531 Weakness: Secondary | ICD-10-CM | POA: Diagnosis not present

## 2014-11-23 DIAGNOSIS — H353221 Exudative age-related macular degeneration, left eye, with active choroidal neovascularization: Secondary | ICD-10-CM | POA: Diagnosis not present

## 2014-11-28 DIAGNOSIS — H353211 Exudative age-related macular degeneration, right eye, with active choroidal neovascularization: Secondary | ICD-10-CM | POA: Diagnosis not present

## 2015-01-07 DIAGNOSIS — I4891 Unspecified atrial fibrillation: Secondary | ICD-10-CM

## 2015-01-07 HISTORY — DX: Unspecified atrial fibrillation: I48.91

## 2015-01-30 DIAGNOSIS — H353211 Exudative age-related macular degeneration, right eye, with active choroidal neovascularization: Secondary | ICD-10-CM | POA: Diagnosis not present

## 2015-02-06 DIAGNOSIS — H353221 Exudative age-related macular degeneration, left eye, with active choroidal neovascularization: Secondary | ICD-10-CM | POA: Diagnosis not present

## 2015-03-14 DIAGNOSIS — S81812A Laceration without foreign body, left lower leg, initial encounter: Secondary | ICD-10-CM | POA: Diagnosis not present

## 2015-03-27 DIAGNOSIS — Z4802 Encounter for removal of sutures: Secondary | ICD-10-CM | POA: Diagnosis not present

## 2015-04-03 DIAGNOSIS — H353211 Exudative age-related macular degeneration, right eye, with active choroidal neovascularization: Secondary | ICD-10-CM | POA: Diagnosis not present

## 2015-04-18 ENCOUNTER — Other Ambulatory Visit: Payer: Self-pay

## 2015-04-18 DIAGNOSIS — Z1231 Encounter for screening mammogram for malignant neoplasm of breast: Secondary | ICD-10-CM

## 2015-05-07 DIAGNOSIS — G479 Sleep disorder, unspecified: Secondary | ICD-10-CM | POA: Diagnosis not present

## 2015-05-07 DIAGNOSIS — E781 Pure hyperglyceridemia: Secondary | ICD-10-CM | POA: Diagnosis not present

## 2015-05-07 DIAGNOSIS — Z9010 Acquired absence of unspecified breast and nipple: Secondary | ICD-10-CM | POA: Diagnosis not present

## 2015-05-07 DIAGNOSIS — Z0000 Encounter for general adult medical examination without abnormal findings: Secondary | ICD-10-CM | POA: Diagnosis not present

## 2015-05-07 DIAGNOSIS — D649 Anemia, unspecified: Secondary | ICD-10-CM | POA: Diagnosis not present

## 2015-05-07 DIAGNOSIS — Z853 Personal history of malignant neoplasm of breast: Secondary | ICD-10-CM | POA: Diagnosis not present

## 2015-05-07 DIAGNOSIS — M353 Polymyalgia rheumatica: Secondary | ICD-10-CM | POA: Diagnosis not present

## 2015-05-07 DIAGNOSIS — I10 Essential (primary) hypertension: Secondary | ICD-10-CM | POA: Diagnosis not present

## 2015-05-08 DIAGNOSIS — H353221 Exudative age-related macular degeneration, left eye, with active choroidal neovascularization: Secondary | ICD-10-CM | POA: Diagnosis not present

## 2015-05-14 DIAGNOSIS — H353211 Exudative age-related macular degeneration, right eye, with active choroidal neovascularization: Secondary | ICD-10-CM | POA: Diagnosis not present

## 2015-05-15 ENCOUNTER — Ambulatory Visit
Admission: RE | Admit: 2015-05-15 | Discharge: 2015-05-15 | Disposition: A | Discharge status: Home / Self Care | Payer: Medicare Other | Source: Ambulatory Visit

## 2015-05-15 DIAGNOSIS — Z1231 Encounter for screening mammogram for malignant neoplasm of breast: Secondary | ICD-10-CM | POA: Diagnosis not present

## 2015-06-20 DIAGNOSIS — H353211 Exudative age-related macular degeneration, right eye, with active choroidal neovascularization: Secondary | ICD-10-CM | POA: Diagnosis not present

## 2015-08-01 DIAGNOSIS — H353211 Exudative age-related macular degeneration, right eye, with active choroidal neovascularization: Secondary | ICD-10-CM | POA: Diagnosis not present

## 2015-08-21 DIAGNOSIS — H353221 Exudative age-related macular degeneration, left eye, with active choroidal neovascularization: Secondary | ICD-10-CM | POA: Diagnosis not present

## 2015-08-27 DIAGNOSIS — R000 Tachycardia, unspecified: Secondary | ICD-10-CM | POA: Diagnosis not present

## 2015-08-27 DIAGNOSIS — R634 Abnormal weight loss: Secondary | ICD-10-CM | POA: Diagnosis not present

## 2015-08-27 DIAGNOSIS — I499 Cardiac arrhythmia, unspecified: Secondary | ICD-10-CM | POA: Diagnosis not present

## 2015-08-27 DIAGNOSIS — I4891 Unspecified atrial fibrillation: Secondary | ICD-10-CM | POA: Diagnosis not present

## 2015-08-30 ENCOUNTER — Telehealth: Payer: Self-pay | Admitting: Internal Medicine

## 2015-08-30 NOTE — Telephone Encounter (Signed)
Received records from [facility] for appointment on 08/31/15 with Dr Debara Pickett.  Records given to [facility] (medical records) for Dr Lysbeth Penner schedule on 08/31/15. lp

## 2015-08-31 ENCOUNTER — Encounter: Payer: Self-pay | Admitting: Internal Medicine

## 2015-08-31 ENCOUNTER — Ambulatory Visit (INDEPENDENT_AMBULATORY_CARE_PROVIDER_SITE_OTHER): Payer: Medicare Other | Admitting: Internal Medicine

## 2015-08-31 ENCOUNTER — Encounter (INDEPENDENT_AMBULATORY_CARE_PROVIDER_SITE_OTHER): Payer: Self-pay

## 2015-08-31 VITALS — BP 142/72 | HR 108 | Ht 66.0 in | Wt 148.0 lb

## 2015-08-31 DIAGNOSIS — I10 Essential (primary) hypertension: Secondary | ICD-10-CM

## 2015-08-31 DIAGNOSIS — I4891 Unspecified atrial fibrillation: Secondary | ICD-10-CM

## 2015-08-31 DIAGNOSIS — N184 Chronic kidney disease, stage 4 (severe): Secondary | ICD-10-CM | POA: Insufficient documentation

## 2015-08-31 DIAGNOSIS — N183 Chronic kidney disease, stage 3 (moderate): Secondary | ICD-10-CM

## 2015-08-31 HISTORY — DX: Unspecified atrial fibrillation: I48.91

## 2015-08-31 MED ORDER — DILTIAZEM HCL ER COATED BEADS 360 MG PO CP24: 360.0000 mg | ORAL_CAPSULE | Freq: Every day | ORAL | 5 refills | Status: DC

## 2015-08-31 NOTE — Patient Instructions (Addendum)
Your physician has recommended you make the following change in your medication...  1. CONTINUE eliquis 2.5mg  twice daily 2. INCREASE diltiazem to 360mg  once daily  IT IS VERY IMPORTANT THAT YOU DO NOT MISS ANY DOSES OF ELIQUIS  Your physician has requested that you have an echocardiogram @ [address] - [address]rd [address]. Echocardiography is a painless test that uses sound waves to create images of your heart. It provides your doctor with information about the size and shape of your heart and how well your heart's chambers and valves are working. This procedure takes approximately one hour. There are no restrictions for this procedure.  Your physician recommends that you schedule a follow-up appointment in: ONE MONTH with Dr. Debara Pickett w/EKG to discuss cardioversion

## 2015-08-31 NOTE — Progress Notes (Signed)
OFFICE NOTE  Chief Complaint:  Hospital follow-up  Primary Care Physician:  Melinda Crutch, MD  HPI:  Allison Norman is a 82 y.o. female who is coming referred to me for new onset atrial fibrillation. Recently Allison Norman had noted that her heart was fluttering somewhat when she laid on her left side. She presented for evaluation of leg swelling and was noted to have an irregular heartbeat. EKG was performed which showed a new onset atrial fibrillation. She has had somewhat elevated ventricular responses from the 90s to 120s. She was a ready on the metoprolol as well as diltiazem and was placed on Eliquis 2.5 mg twice daily. This is based on age greater than 75 and creatinine of 1.57. Allison Norman is been on this dose for 2 days and has had some bleeding problems. She has a history of thin skin and was on steroids for PMR. She had some bleeding over her left forearm which was dressed today. She denies any chest pain or worsening shortness of breath. 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  PMHx:  Past Medical History:  Diagnosis Date  . Cancer ([place])   . GCA (giant cell arteritis) ([place])   . Hypertension   . Macular degeneration   . Osteoporosis   . PMR (polymyalgia rheumatica) ([facility])   . Skin cancer     History reviewed. No pertinent surgical history.  FAMHx:  Family History  Problem Relation Age of Onset  . Kidney failure Father   . Heart disease Sister     SOCHx:   reports that she quit smoking about 40 years ago. She has never used smokeless tobacco. She reports that she does not drink alcohol or use drugs.  ALLERGIES:  Allergies  Allergen Reactions  . Codeine   . Penicillins     ROS: Pertinent items noted in HPI and remainder of comprehensive ROS otherwise negative.  HOME MEDS: Current Outpatient Prescriptions  Medication Sig Dispense Refill  . apixaban (ELIQUIS) 5 MG TABS tablet Take 2.5 mg  by mouth 2 (two) times daily.     . Calcium 600-200 MG-UNIT per tablet Take 1 tablet by mouth 2 (two) times daily. Per PCP     . cloNIDine (CATAPRES) 0.3 MG tablet Take 0.3 mg by mouth 2 (two) times daily. Per PCP     . docusate sodium (DOK) 100 MG capsule Take 100 mg by mouth 2 (two) times daily. Per hospital MD     . furosemide (LASIX) 80 MG tablet Take 80 mg by mouth daily. Per PCP     . HYDROcodone-acetaminophen (NORCO) 5-325 MG per tablet Take 1 tablet by mouth 4 (four) times daily as needed. For pain     . losartan (COZAAR) 100 MG tablet Take 100 mg by mouth daily. Per PCP     . metoprolol (TOPROL XL) 100 MG 24 hr tablet Take 100 mg by mouth daily. Per PCP     . pantoprazole (PROTONIX) 40 MG tablet Take 40 mg by mouth daily. Per hospital MD     . polyethylene glycol (MIRALAX) packet Take 17 g by mouth every evening. One packet dissolved in fluid. Drink by mouth every evening     . diltiazem (CARDIZEM CD) 360 MG 24 hr capsule Take 1 capsule (360 mg total) by mouth daily. 30 capsule 5   No current facility-administered medications for this visit.     LABS/IMAGING: No results  found for this or any previous visit (from the past 48 hour(s)). No results found.  WEIGHTS: Wt Readings from Last 3 Encounters:  08/31/15 148 lb (67.1 kg)  01/21/10 138 lb (62.6 kg)  10/31/09 138 lb (62.6 kg)    VITALS: BP (!) 142/72   Pulse (!) 108   Ht 5\' 6"  (1.676 m)   Wt 148 lb (67.1 kg)   BMI 23.89 kg/m   EXAM: General appearance: alert and no distress Neck: no carotid bruit and no JVD Lungs: clear to auscultation bilaterally Heart: irregularly irregular rhythm Abdomen: soft, non-tender; bowel sounds normal; no masses,  no organomegaly Extremities: edema Trace bilateral pedal edema, small varicose veins Pulses: 2+ and symmetric Skin: Skin color, texture, turgor normal. No rashes or lesions Neurologic: Grossly normal Psych: Pleasant  EKG: Atrial fibrillation with rapid ventricular response  at 108  ASSESSMENT: 1. New onset A. fib-CHADSVASC score of 4 2. Hypertension 3. Leg edema  PLAN: 1.  Allison Norman has new onset atrial fibrillation and a CHADSVASC score of 4, she is appropriately anticoagulated on Eliquis 2.5 mg twice a day (reduced dose for age greater than 10 and creatinine greater than 1.5). We discussed management options and I would like to offer her an opportunity to get back in a sinus rhythm. We've agreed that we will pursue 3-4 weeks of anticoagulation and see her back in the office. If she remains in atrial fibrillation then I would recommend elective cardioversion. Rate control could be improved somewhat. She is ready on high-dose beta blocker and a moderate dose of calcium channel blocker. I've advised her to increase her diltiazem to 360 mg long-acting daily and provided her prescription for that. She is advised to continue her Eliquis twice daily and not miss any doses which could delay a cardioversion. Follow-up with me in one month.  Thanks for the kind referral.  Pixie Casino, MD, [facility] Attending Cardiologist [place] 08/31/2015, 5:39 PM

## 2015-09-03 ENCOUNTER — Encounter: Payer: Self-pay | Admitting: *Deleted

## 2015-09-03 ENCOUNTER — Telehealth: Payer: Self-pay | Admitting: Internal Medicine

## 2015-09-03 DIAGNOSIS — H35371 Puckering of macula, right eye: Secondary | ICD-10-CM | POA: Diagnosis not present

## 2015-09-03 DIAGNOSIS — H353211 Exudative age-related macular degeneration, right eye, with active choroidal neovascularization: Secondary | ICD-10-CM | POA: Diagnosis not present

## 2015-09-03 DIAGNOSIS — H35352 Cystoid macular degeneration, left eye: Secondary | ICD-10-CM | POA: Diagnosis not present

## 2015-09-03 DIAGNOSIS — H353133 Nonexudative age-related macular degeneration, bilateral, advanced atrophic without subfoveal involvement: Secondary | ICD-10-CM | POA: Diagnosis not present

## 2015-09-03 DIAGNOSIS — H353221 Exudative age-related macular degeneration, left eye, with active choroidal neovascularization: Secondary | ICD-10-CM | POA: Diagnosis not present

## 2015-09-03 NOTE — Telephone Encounter (Signed)
Patient notified of MD advice. She voiced understanding. She is aware to call back regarding symptoms. Med list updated.

## 2015-09-03 NOTE — Telephone Encounter (Signed)
That's fine to decrease it back to 240 mg daily if she is feeling dizzy. Please let us know if it improves.  DR. Lemmie Evens

## 2015-09-03 NOTE — Telephone Encounter (Signed)
Returned call to patient.She stated since she increased Cardizem to 360 mg daily on 08/31/15 she has dizziness when she first stands and she feels weaker.She wanted to ask Dr.Hilty can she go back to 240 mg.Message sent to Dr.Hilty for advice.

## 2015-09-03 NOTE — Telephone Encounter (Signed)
New message      Pt c/o medication issue:  1. Name of Medication: diltiazem  2. How are you currently taking this medication (dosage and times per day)? 360mg  daily 3. Are you having a reaction (difficulty breathing--STAT)?  4. What is your medication issue?  Pt states she gets dizzy and very weak in the afternoons for the last 2 days.  Could this be the medication? Please advise

## 2015-09-03 NOTE — Telephone Encounter (Signed)
Returned call to patient.Dr.Hilty advised ok to decrease Cardizem back to 240 mg daily.Advised to call and let us know if dizziness improves.

## 2015-09-13 ENCOUNTER — Ambulatory Visit (HOSPITAL_COMMUNITY): Payer: Medicare Other | Attending: Internal Medicine

## 2015-09-13 ENCOUNTER — Other Ambulatory Visit: Payer: Self-pay

## 2015-09-13 DIAGNOSIS — I4891 Unspecified atrial fibrillation: Secondary | ICD-10-CM | POA: Insufficient documentation

## 2015-09-13 DIAGNOSIS — I272 Other secondary pulmonary hypertension: Secondary | ICD-10-CM | POA: Diagnosis not present

## 2015-09-13 DIAGNOSIS — I119 Hypertensive heart disease without heart failure: Secondary | ICD-10-CM | POA: Insufficient documentation

## 2015-09-13 DIAGNOSIS — I340 Nonrheumatic mitral (valve) insufficiency: Secondary | ICD-10-CM | POA: Insufficient documentation

## 2015-09-13 DIAGNOSIS — I358 Other nonrheumatic aortic valve disorders: Secondary | ICD-10-CM | POA: Insufficient documentation

## 2015-09-13 DIAGNOSIS — Z87891 Personal history of nicotine dependence: Secondary | ICD-10-CM | POA: Diagnosis not present

## 2015-09-20 ENCOUNTER — Encounter: Payer: Self-pay | Admitting: *Deleted

## 2015-09-20 NOTE — Telephone Encounter (Signed)
-----   Message from Pixie Casino, MD sent at 09/17/2015  3:01 PM EDT ----- A-fib, EF 60-65%, mild MR, moderate biatrial enlargement.  Dr. Lemmie Evens

## 2015-09-20 NOTE — Telephone Encounter (Signed)
pt aware of results   This encounter was created in error - please disregard.

## 2015-09-21 ENCOUNTER — Telehealth: Payer: Self-pay | Admitting: Internal Medicine

## 2015-09-21 NOTE — Telephone Encounter (Signed)
Spoke w/ patient, she notes small area of bruising on nasal bridge/infraorbital area.  She noticed this AM, concerned.  Verified she is taking correct dose of Eliquis. Advised bruising normal, notify us if symptoms change, o/w wait for this to resolve in a few days/week. Pt voiced understanding and thanks.

## 2015-09-21 NOTE — Telephone Encounter (Signed)
Pt is on Eliquis,all of a sudden she has a black eye.Please call to advise.

## 2015-10-11 ENCOUNTER — Encounter: Payer: Self-pay | Admitting: Internal Medicine

## 2015-10-11 ENCOUNTER — Ambulatory Visit (INDEPENDENT_AMBULATORY_CARE_PROVIDER_SITE_OTHER): Payer: Medicare Other | Admitting: Internal Medicine

## 2015-10-11 VITALS — BP 134/83 | HR 81 | Ht 66.0 in | Wt 148.6 lb

## 2015-10-11 DIAGNOSIS — I4891 Unspecified atrial fibrillation: Secondary | ICD-10-CM

## 2015-10-11 DIAGNOSIS — R5383 Other fatigue: Secondary | ICD-10-CM

## 2015-10-11 DIAGNOSIS — D689 Coagulation defect, unspecified: Secondary | ICD-10-CM

## 2015-10-11 DIAGNOSIS — Z01818 Encounter for other preprocedural examination: Secondary | ICD-10-CM | POA: Diagnosis not present

## 2015-10-11 DIAGNOSIS — I10 Essential (primary) hypertension: Secondary | ICD-10-CM | POA: Diagnosis not present

## 2015-10-11 LAB — BASIC METABOLIC PANEL WITH GFR
BUN: 31 mg/dL — ABNORMAL HIGH (ref 7–25)
CO2: 29 mmol/L (ref 20–31)
Calcium: 8.9 mg/dL (ref 8.6–10.4)
Chloride: 103 mmol/L (ref 98–110)
Creat: 1.62 mg/dL — ABNORMAL HIGH (ref 0.60–0.88)
Glucose, Bld: 97 mg/dL (ref 65–99)
Potassium: 4.1 mmol/L (ref 3.5–5.3)
Sodium: 142 mmol/L (ref 135–146)

## 2015-10-11 LAB — CBC
HCT: 41.2 % (ref 35.0–45.0)
HEMOGLOBIN: 13.2 g/dL (ref 11.7–15.5)
MCH: 29.6 pg (ref 27.0–33.0)
MCHC: 32 g/dL (ref 32.0–36.0)
MCV: 92.4 fL (ref 80.0–100.0)
MPV: 10 fL (ref 7.5–12.5)
Platelets: 258 10*3/uL (ref 140–400)
RBC: 4.46 MIL/uL (ref 3.80–5.10)
RDW: 13.9 % (ref 11.0–15.0)
WBC: 6.6 10*3/uL (ref 3.8–10.8)

## 2015-10-11 LAB — TSH: TSH: 1.12 m[IU]/L

## 2015-10-11 MED ORDER — APIXABAN 5 MG PO TABS: 2.5000 mg | ORAL_TABLET | Freq: Two times a day (BID) | ORAL | 0 refills | Status: DC

## 2015-10-11 MED ORDER — APIXABAN 2.5 MG PO TABS: 2.5000 mg | ORAL_TABLET | Freq: Two times a day (BID) | ORAL | 0 refills | Status: DC

## 2015-10-11 NOTE — Progress Notes (Signed)
OFFICE NOTE  Chief Complaint:  Hospital follow-up  Primary Care Physician:  Melinda Crutch, MD  HPI:  Allison Norman is a 82 y.o. female who is coming referred to me for new onset atrial fibrillation. Recently [name] had noted that her heart was fluttering somewhat when she laid on her left side. She presented for evaluation of leg swelling and was noted to have an irregular heartbeat. EKG was performed which showed a new onset atrial fibrillation. She has had somewhat elevated ventricular responses from the 90s to 120s. She was a ready on the metoprolol as well as diltiazem and was placed on Eliquis 2.5 mg twice daily. This is based on age greater than 27 and creatinine of 1.57. [name] is been on this dose for 2 days and has had some bleeding problems. She has a history of thin skin and was on steroids for PMR. She had some bleeding over her left forearm which was dressed today. She denies any chest pain or worsening shortness of breath. 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  10/11/2015  [name] returns today for follow-up. She remains in atrial fibrillation with controlled ventricular response. I had increased her diltiazem however this apparently dropped her blood pressure. She was also on losartan. She did not feel well with a low blood pressure and decrease the dose back to her original dose. She's been compliant with her anticoagulation and has been anticoagulated for close to 4 weeks. We discussed elective cardioversion and she is willing to proceed.  PMHx:  Past Medical History:  Diagnosis Date  . Cancer ([place])   . GCA (giant cell arteritis) ([place])   . Hypertension   . Macular degeneration   . Osteoporosis   . PMR (polymyalgia rheumatica) ([facility])   . Skin cancer     Past Surgical History:  Procedure Laterality Date  . KNEE SURGERY  2013  . MASTECTOMY  1998    FAMHx:  Family History    Problem Relation Age of Onset  . Kidney failure Father     died at 70  . Heart disease Sister     died at 77  . Heart disease Sister     died at 98    SOCHx:   reports that she quit smoking about 49 years ago. She has never used smokeless tobacco. She reports that she does not drink alcohol or use drugs.  ALLERGIES:  Allergies  Allergen Reactions  . Codeine   . Penicillins     ROS: Pertinent items noted in HPI and remainder of comprehensive ROS otherwise negative.  HOME MEDS: Current Outpatient Prescriptions  Medication Sig Dispense Refill  . apixaban (ELIQUIS) 5 MG TABS tablet Take 2.5 mg by mouth 2 (two) times daily.     . Calcium 600-200 MG-UNIT per tablet Take 1 tablet by mouth 2 (two) times daily. Per PCP     . cloNIDine (CATAPRES) 0.3 MG tablet Take 0.3 mg by mouth 2 (two) times daily. Per PCP     . diltiazem (CARDIZEM CD) 240 MG 24 hr capsule Take 240 mg by mouth daily.    [name] docusate sodium (DOK) 100 MG capsule Take 100 mg by mouth 2 (two) times daily. Per hospital MD     . furosemide (LASIX) 80 MG tablet Take 80 mg by mouth daily. Per PCP     . HYDROcodone-acetaminophen (NORCO) 5-325 MG per tablet Take 1  tablet by mouth 4 (four) times daily as needed. For pain     . losartan (COZAAR) 100 MG tablet Take 100 mg by mouth daily. Per PCP     . metoprolol (TOPROL XL) 100 MG 24 hr tablet Take 100 mg by mouth daily. Per PCP     . pantoprazole (PROTONIX) 40 MG tablet Take 40 mg by mouth daily. Per hospital MD     . polyethylene glycol (MIRALAX) packet Take 17 g by mouth every evening. One packet dissolved in fluid. Drink by mouth every evening      No current facility-administered medications for this visit.     LABS/IMAGING: No results found for this or any previous visit (from the past 48 hour(s)). No results found.  WEIGHTS: Wt Readings from Last 3 Encounters:  10/11/15 148 lb 9.6 oz (67.4 kg)  08/31/15 148 lb (67.1 kg)  01/21/10 138 lb (62.6 kg)    VITALS: BP  134/83   Pulse 81   Ht 5\' 6"  (1.676 m)   Wt 148 lb 9.6 oz (67.4 kg)   BMI 23.98 kg/m   EXAM: Deferred  EKG: Atrial fibrillation with controlled ventricular response at 95  ASSESSMENT: 1. New onset A. fib-CHADSVASC score of 4 2. Hypertension 3. Leg edema  PLAN: 1.  [name] has been anticoagulated for almost 1 month. She remains in a-fib. Plan for elective DCCV next week. Emphasized that she cannot miss any doses of her blood thinner. Continue current cardizem dose. Follow-up 4-6 weeks after cardioversion.  Pixie Casino, MD, [facility] Attending Cardiologist [place] 10/11/2015, 1:57 PM

## 2015-10-11 NOTE — Patient Instructions (Addendum)
Medication Instructions:  Your physician recommends that you continue on your current medications as directed. Please refer to the Current Medication list given to you today.  Labwork: Your physician recommends that you return for lab work in: [place] CARDIOVERSION-CBC, BMP, PT, PTT, TSH   Testing/Procedures: Your physician has recommended that you have a Cardioversion (DCCV). Electrical Cardioversion uses a jolt of electricity to your heart either through paddles or wired patches attached to your chest. This is a controlled, usually prescheduled, procedure. Defibrillation is done under light anesthesia in the hospital, and you usually go home the day of the procedure. This is done to get your heart back into a normal rhythm. You are not awake for the procedure. Please see the instruction sheet given to you today.  A chest x-ray takes a picture of the organs and structures inside the chest, including the heart, lungs, and blood vessels. This test can show several things, including, whether the heart is enlarges; whether fluid is building up in the lungs; and whether pacemaker / defibrillator leads are still in place.  Follow-Up: Your physician recommends that you schedule a follow-up appointment in: AFTER CARDIOVERSION  Any Other Special Instructions Will Be Listed Below (If Applicable).  SCHEDULE CARDIOVERSION FOR NEXT WEEK ANY DAY BUT WEDNESDAY   If you need a refill on your cardiac medications before your next appointment, please call your pharmacy.

## 2015-10-12 ENCOUNTER — Telehealth: Payer: Self-pay | Admitting: *Deleted

## 2015-10-12 ENCOUNTER — Other Ambulatory Visit: Payer: Self-pay | Admitting: *Deleted

## 2015-10-12 DIAGNOSIS — I4891 Unspecified atrial fibrillation: Secondary | ICD-10-CM

## 2015-10-12 DIAGNOSIS — N289 Disorder of kidney and ureter, unspecified: Secondary | ICD-10-CM

## 2015-10-12 LAB — PROTIME-INR
INR: 1.1
PROTHROMBIN TIME: 11.4 s (ref 9.0–11.5)

## 2015-10-12 LAB — APTT: aPTT: 29 s (ref 22–34)

## 2015-10-12 MED ORDER — FUROSEMIDE 80 MG PO TABS: 40.0000 mg | ORAL_TABLET | Freq: Every day | ORAL | Status: DC

## 2015-10-12 NOTE — Telephone Encounter (Signed)
-----   Message from Pixie Casino, MD sent at 10/12/2015 12:18 PM EDT ----- Creatinine is elevated - hold lasix for 2 days and then decrease to 40 mg daily. Repeat BMET early next week prior to her procedure.  Dr. Lemmie Evens

## 2015-10-12 NOTE — Telephone Encounter (Signed)
Spoke with pt, aware of medication change. Lab orders placed

## 2015-10-15 DIAGNOSIS — H353133 Nonexudative age-related macular degeneration, bilateral, advanced atrophic without subfoveal involvement: Secondary | ICD-10-CM | POA: Diagnosis not present

## 2015-10-15 DIAGNOSIS — H353221 Exudative age-related macular degeneration, left eye, with active choroidal neovascularization: Secondary | ICD-10-CM | POA: Diagnosis not present

## 2015-10-15 DIAGNOSIS — H353211 Exudative age-related macular degeneration, right eye, with active choroidal neovascularization: Secondary | ICD-10-CM | POA: Diagnosis not present

## 2015-10-15 DIAGNOSIS — H35371 Puckering of macula, right eye: Secondary | ICD-10-CM | POA: Diagnosis not present

## 2015-10-16 ENCOUNTER — Ambulatory Visit
Admission: RE | Admit: 2015-10-16 | Discharge: 2015-10-16 | Disposition: A | Discharge status: Home / Self Care | Payer: Medicare Other | Source: Ambulatory Visit | Attending: Internal Medicine | Admitting: Internal Medicine

## 2015-10-16 DIAGNOSIS — J449 Chronic obstructive pulmonary disease, unspecified: Secondary | ICD-10-CM | POA: Diagnosis not present

## 2015-10-16 DIAGNOSIS — N289 Disorder of kidney and ureter, unspecified: Secondary | ICD-10-CM | POA: Diagnosis not present

## 2015-10-17 ENCOUNTER — Telehealth: Payer: Self-pay | Admitting: Internal Medicine

## 2015-10-17 LAB — BASIC METABOLIC PANEL
BUN: 31 mg/dL — AB (ref 7–25)
CALCIUM: 8.9 mg/dL (ref 8.6–10.4)
CO2: 26 mmol/L (ref 20–31)
Chloride: 104 mmol/L (ref 98–110)
Creat: 1.43 mg/dL — ABNORMAL HIGH (ref 0.60–0.88)
GLUCOSE: 96 mg/dL (ref 65–99)
POTASSIUM: 4.3 mmol/L (ref 3.5–5.3)
Sodium: 140 mmol/L (ref 135–146)

## 2015-10-17 NOTE — Telephone Encounter (Signed)
Pt having Cardioversion tomorrow-pt having a cough -wants to talk to nurse-denies fever-pls call    [phone]

## 2015-10-17 NOTE — Telephone Encounter (Signed)
PATIENT STATES SHE HAS A COUGH ,NO FEVER, OR CONGESTION. CONCERNED IF SHE SHOULD HAVE CARDIOVERSION TOMORROW.  WILL DEFER A HILTY

## 2015-10-17 NOTE — Telephone Encounter (Signed)
REVIEWED WITH DR Claiborne Billings, ( D.O.D) , IT WILL BE OKAY TO PROCEED WITH CARDIOVERSION FOR TOMORROW. PATIENT NOTIFIED AND VERBALIZED UNDERSTANDING.

## 2015-10-18 ENCOUNTER — Ambulatory Visit (HOSPITAL_COMMUNITY): Payer: Medicare Other | Admitting: Anesthesiology

## 2015-10-18 ENCOUNTER — Ambulatory Visit (HOSPITAL_COMMUNITY)
Admission: RE | Admit: 2015-10-18 | Discharge: 2015-10-18 | Disposition: A | Discharge status: Home / Self Care | Payer: Medicare Other | Source: Ambulatory Visit | Attending: Internal Medicine | Admitting: Internal Medicine

## 2015-10-18 ENCOUNTER — Encounter (HOSPITAL_COMMUNITY)
Admission: RE | Disposition: A | Discharge status: Home / Self Care | Payer: Self-pay | Source: Ambulatory Visit | Attending: Internal Medicine

## 2015-10-18 ENCOUNTER — Encounter (HOSPITAL_COMMUNITY): Payer: Self-pay | Admitting: *Deleted

## 2015-10-18 DIAGNOSIS — I480 Paroxysmal atrial fibrillation: Secondary | ICD-10-CM | POA: Diagnosis not present

## 2015-10-18 DIAGNOSIS — Z79899 Other long term (current) drug therapy: Secondary | ICD-10-CM | POA: Insufficient documentation

## 2015-10-18 DIAGNOSIS — Z7901 Long term (current) use of anticoagulants: Secondary | ICD-10-CM | POA: Diagnosis not present

## 2015-10-18 DIAGNOSIS — Z85828 Personal history of other malignant neoplasm of skin: Secondary | ICD-10-CM | POA: Insufficient documentation

## 2015-10-18 DIAGNOSIS — Z880 Allergy status to penicillin: Secondary | ICD-10-CM | POA: Diagnosis not present

## 2015-10-18 DIAGNOSIS — I4891 Unspecified atrial fibrillation: Secondary | ICD-10-CM | POA: Insufficient documentation

## 2015-10-18 DIAGNOSIS — Z9010 Acquired absence of unspecified breast and nipple: Secondary | ICD-10-CM | POA: Diagnosis not present

## 2015-10-18 DIAGNOSIS — I129 Hypertensive chronic kidney disease with stage 1 through stage 4 chronic kidney disease, or unspecified chronic kidney disease: Secondary | ICD-10-CM | POA: Diagnosis not present

## 2015-10-18 DIAGNOSIS — I10 Essential (primary) hypertension: Secondary | ICD-10-CM | POA: Insufficient documentation

## 2015-10-18 DIAGNOSIS — H3530 Unspecified macular degeneration: Secondary | ICD-10-CM | POA: Diagnosis not present

## 2015-10-18 DIAGNOSIS — Z87891 Personal history of nicotine dependence: Secondary | ICD-10-CM | POA: Diagnosis not present

## 2015-10-18 DIAGNOSIS — M353 Polymyalgia rheumatica: Secondary | ICD-10-CM | POA: Diagnosis not present

## 2015-10-18 DIAGNOSIS — M009 Pyogenic arthritis, unspecified: Secondary | ICD-10-CM | POA: Diagnosis not present

## 2015-10-18 HISTORY — PX: CARDIOVERSION: SHX1299

## 2015-10-18 SURGERY — CARDIOVERSION
Anesthesia: Monitor Anesthesia Care

## 2015-10-18 MED ORDER — LIDOCAINE HCL (CARDIAC) 20 MG/ML IV SOLN
INTRAVENOUS | Status: DC | PRN
  Administered 2015-10-18: 40 mg via INTRAVENOUS

## 2015-10-18 MED ORDER — METOPROLOL SUCCINATE ER 50 MG PO TB24: 50.0000 mg | ORAL_TABLET | Freq: Every day | ORAL | 6 refills | Status: DC

## 2015-10-18 MED ORDER — SODIUM CHLORIDE 0.9 % IV SOLN
INTRAVENOUS | Status: DC | PRN
  Administered 2015-10-18: 14:00:00 via INTRAVENOUS

## 2015-10-18 MED ORDER — PROPOFOL 10 MG/ML IV BOLUS
INTRAVENOUS | Status: DC | PRN
  Administered 2015-10-18: 40 mg via INTRAVENOUS

## 2015-10-18 NOTE — Anesthesia Postprocedure Evaluation (Signed)
Anesthesia Post Note  Patient: Allison Norman  Procedure(s) Performed: Procedure(s) (LRB): CARDIOVERSION (N/A)  Patient location during evaluation: PACU Anesthesia Type: MAC Level of consciousness: awake and alert Pain management: pain level controlled Vital Signs Assessment: post-procedure vital signs reviewed and stable Respiratory status: spontaneous breathing, nonlabored ventilation, respiratory function stable and patient connected to nasal cannula oxygen Cardiovascular status: stable and blood pressure returned to baseline Anesthetic complications: no    Last Vitals:  Vitals:   10/18/15 1430 10/18/15 1435  BP: 124/66 (!) 116/57  Pulse: (!) 55 (!) 50  Resp: 17 15  Temp:      Last Pain:  Vitals:   10/18/15 1418  TempSrc: Oral                 Tiajuana Amass

## 2015-10-18 NOTE — Anesthesia Preprocedure Evaluation (Signed)
Anesthesia Evaluation  Patient identified by MRN, date of birth, ID band Patient awake    Reviewed: Allergy & Precautions, NPO status , Patient's Chart, lab work & pertinent test results  Airway Mallampati: II  TM Distance: >3 FB Neck ROM: Full    Dental   Pulmonary former smoker,    breath sounds clear to auscultation       Cardiovascular hypertension, + Peripheral Vascular Disease  + dysrhythmias Atrial Fibrillation  Rhythm:Irregular Rate:Normal     Neuro/Psych negative neurological ROS     GI/Hepatic negative GI ROS, Neg liver ROS,   Endo/Other  negative endocrine ROS  Renal/GU CRFRenal disease     Musculoskeletal  (+) Arthritis ,   Abdominal   Peds  Hematology negative hematology ROS (+)   Anesthesia Other Findings   Reproductive/Obstetrics                             Anesthesia Physical Anesthesia Plan  ASA: III  Anesthesia Plan: MAC   Post-op Pain Management:    Induction: Intravenous  Airway Management Planned: Natural Airway and Mask  Additional Equipment:   Intra-op Plan:   Post-operative Plan:   Informed Consent: I have reviewed the patients History and Physical, chart, labs and discussed the procedure including the risks, benefits and alternatives for the proposed anesthesia with the patient or authorized representative who has indicated his/her understanding and acceptance.   Dental advisory given  Plan Discussed with: CRNA  Anesthesia Plan Comments:         Anesthesia Quick Evaluation

## 2015-10-18 NOTE — Interval H&P Note (Signed)
History and Physical Interval Note:  10/18/2015 1:57 PM  Allison Norman  has presented today for surgery, with the diagnosis of AFIB  The various methods of treatment have been discussed with the patient and family. After consideration of risks, benefits and other options for treatment, the patient has consented to  Procedure(s): CARDIOVERSION (N/A) as a surgical intervention .  The patient's history has been reviewed, patient examined, no change in status, stable for surgery.  I have reviewed the patient's chart and labs.  Questions were answered to the patient's satisfaction.     [employer]

## 2015-10-18 NOTE — CV Procedure (Signed)
    Electrical Cardioversion Procedure Note Allison Norman [id number] [birth year]  Procedure: Electrical Cardioversion Indications:  Atrial Fibrillation  Time Out: Verified patient identification, verified procedure,medications/allergies/relevent history reviewed, required imaging and test results available.  Performed  Procedure Details  The patient was NPO after midnight. Anesthesia was administered at the beside  by Dr. Deatra Norman with propofol.  Cardioversion was performed with synchronized biphasic defibrillation via AP pads with 120 joules.  1 attempt(s) were performed.  The patient converted to normal sinus rhythm. The patient tolerated the procedure well   IMPRESSION:  Successful cardioversion of atrial fibrillation    Allison Norman 10/18/2015, 2:16 PM

## 2015-10-18 NOTE — Transfer of Care (Signed)
Immediate Anesthesia Transfer of Care Note  Patient: Allison Norman  Procedure(s) Performed: Procedure(s): CARDIOVERSION (N/A)  Patient Location: Endoscopy Unit  Anesthesia Type:MAC  Level of Consciousness: awake, alert  and oriented  Airway & Oxygen Therapy: Patient Spontanous Breathing  Post-op Assessment: Report given to RN, Post -op Vital signs reviewed and stable and Patient moving all extremities X 4  Post vital signs: Reviewed and stable  Last Vitals:  Vitals:   10/18/15 1243  BP: (!) 144/97  Pulse: 98  Resp: (!) 22  Temp: 36.9 C    Last Pain:  Vitals:   10/18/15 1243  TempSrc: Oral         Complications: No apparent anesthesia complications

## 2015-10-18 NOTE — Discharge Instructions (Signed)
Electrical Cardioversion °Electrical cardioversion is the delivery of a jolt of electricity to change the rhythm of the heart. Sticky patches or metal paddles are placed on the chest to deliver the electricity from a device. This is done to restore a normal rhythm. A rhythm that is too fast or not regular keeps the heart from pumping well. °Electrical cardioversion is done in an emergency if:  °· There is low or no blood pressure as a result of the heart rhythm.   °· Normal rhythm must be restored as fast as possible to protect the brain and heart from further damage.   °· It may save a life. °Cardioversion may be done for heart rhythms that are not immediately life threatening, such as atrial fibrillation or flutter, in which:  °· The heart is beating too fast or is not regular.   °· Medicine to change the rhythm has not worked.   °· It is safe to wait in order to allow time for preparation. °· Symptoms of the abnormal rhythm are bothersome. °· The risk of stroke and other serious problems can be reduced. °LET YOUR HEALTH CARE PROVIDER KNOW ABOUT:  °· Any allergies you have. °· All medicines you are taking, including vitamins, herbs, eye drops, creams, and over-the-counter medicines. °· Previous problems you or members of your family have had with the use of anesthetics.   °· Any blood disorders you have.   °· Previous surgeries you have had.   °· Medical conditions you have. °RISKS AND COMPLICATIONS  °Generally, this is a safe procedure. However, problems can occur and include:  °· Breathing problems related to the anesthetic used. °· A blood clot that breaks free and travels to other parts of your body. This could cause a stroke or other problems. The risk of this is lowered by use of blood-thinning medicine (anticoagulant) prior to the procedure. °· Cardiac arrest (rare). °BEFORE THE PROCEDURE  °· You may have tests to detect blood clots in your heart and to evaluate heart function.  °· You may start taking  anticoagulants so your blood does not clot as easily.   °· Medicines may be given to help stabilize your heart rate and rhythm. °PROCEDURE °· You will be given medicine through an IV tube to reduce discomfort and make you sleepy (sedative).   °· An electrical shock will be delivered. °AFTER THE PROCEDURE °Your heart rhythm will be watched to make sure it does not change.  °  °This information is not intended to replace advice given to you by your health care provider. Make sure you discuss any questions you have with your health care provider. °  °Document Released: 12/13/2001 Document Revised: 01/13/2014 Document Reviewed: 07/07/2012 °Elsevier Interactive Patient Education ©2016 [employer] ° °

## 2015-10-18 NOTE — H&P (View-Only) (Signed)
OFFICE NOTE  Chief Complaint:  Hospital follow-up  Primary Care Physician:  Melinda Crutch, MD  HPI:  Allison Norman is a 82 y.o. female who is coming referred to me for new onset atrial fibrillation. Recently Allison Norman had noted that her heart was fluttering somewhat when she laid on her left side. She presented for evaluation of leg swelling and was noted to have an irregular heartbeat. EKG was performed which showed a new onset atrial fibrillation. She has had somewhat elevated ventricular responses from the 90s to 120s. She was a ready on the metoprolol as well as diltiazem and was placed on Eliquis 2.5 mg twice daily. This is based on age greater than 30 and creatinine of 1.57. Allison Norman is been on this dose for 2 days and has had some bleeding problems. She has a history of thin skin and was on steroids for PMR. She had some bleeding over her left forearm which was dressed today. She denies any chest pain or worsening shortness of breath. 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  10/11/2015  Allison Norman returns today for follow-up. She remains in atrial fibrillation with controlled ventricular response. I had increased her diltiazem however this apparently dropped her blood pressure. She was also on losartan. She did not feel well with a low blood pressure and decrease the dose back to her original dose. She's been compliant with her anticoagulation and has been anticoagulated for close to 4 weeks. We discussed elective cardioversion and she is willing to proceed.  PMHx:  Past Medical History:  Diagnosis Date  . Cancer ([place])   . GCA (giant cell arteritis) ([place])   . Hypertension   . Macular degeneration   . Osteoporosis   . PMR (polymyalgia rheumatica) ([facility])   . Skin cancer     Past Surgical History:  Procedure Laterality Date  . KNEE SURGERY  2013  . MASTECTOMY  1998    FAMHx:  Family History    Problem Relation Age of Onset  . Kidney failure Father     died at 56  . Heart disease Sister     died at 45  . Heart disease Sister     died at 9    SOCHx:   reports that she quit smoking about 49 years ago. She has never used smokeless tobacco. She reports that she does not drink alcohol or use drugs.  ALLERGIES:  Allergies  Allergen Reactions  . Codeine   . Penicillins     ROS: Pertinent items noted in HPI and remainder of comprehensive ROS otherwise negative.  HOME MEDS: Current Outpatient Prescriptions  Medication Sig Dispense Refill  . apixaban (ELIQUIS) 5 MG TABS tablet Take 2.5 mg by mouth 2 (two) times daily.     . Calcium 600-200 MG-UNIT per tablet Take 1 tablet by mouth 2 (two) times daily. Per PCP     . cloNIDine (CATAPRES) 0.3 MG tablet Take 0.3 mg by mouth 2 (two) times daily. Per PCP     . diltiazem (CARDIZEM CD) 240 MG 24 hr capsule Take 240 mg by mouth daily.    [name] docusate sodium (DOK) 100 MG capsule Take 100 mg by mouth 2 (two) times daily. Per hospital MD     . furosemide (LASIX) 80 MG tablet Take 80 mg by mouth daily. Per PCP     . HYDROcodone-acetaminophen (NORCO) 5-325 MG per tablet Take 1  tablet by mouth 4 (four) times daily as needed. For pain     . losartan (COZAAR) 100 MG tablet Take 100 mg by mouth daily. Per PCP     . metoprolol (TOPROL XL) 100 MG 24 hr tablet Take 100 mg by mouth daily. Per PCP     . pantoprazole (PROTONIX) 40 MG tablet Take 40 mg by mouth daily. Per hospital MD     . polyethylene glycol (MIRALAX) packet Take 17 g by mouth every evening. One packet dissolved in fluid. Drink by mouth every evening      No current facility-administered medications for this visit.     LABS/IMAGING: No results found for this or any previous visit (from the past 48 hour(s)). No results found.  WEIGHTS: Wt Readings from Last 3 Encounters:  10/11/15 148 lb 9.6 oz (67.4 kg)  08/31/15 148 lb (67.1 kg)  01/21/10 138 lb (62.6 kg)    VITALS: BP  134/83   Pulse 81   Ht 5\' 6"  (1.676 m)   Wt 148 lb 9.6 oz (67.4 kg)   BMI 23.98 kg/m   EXAM: Deferred  EKG: Atrial fibrillation with controlled ventricular response at 95  ASSESSMENT: 1. New onset A. fib-CHADSVASC score of 4 2. Hypertension 3. Leg edema  PLAN: 1.  Allison Norman has been anticoagulated for almost 1 month. She remains in a-fib. Plan for elective DCCV next week. Emphasized that she cannot miss any doses of her blood thinner. Continue current cardizem dose. Follow-up 4-6 weeks after cardioversion.  Pixie Casino, MD, [facility] Attending Cardiologist [place] 10/11/2015, 1:57 PM

## 2015-10-19 ENCOUNTER — Encounter (HOSPITAL_COMMUNITY): Payer: Self-pay | Admitting: Cardiology

## 2015-10-19 ENCOUNTER — Telehealth: Payer: Self-pay | Admitting: Internal Medicine

## 2015-10-19 NOTE — Telephone Encounter (Signed)
Do not need encounter

## 2015-11-13 DIAGNOSIS — G4700 Insomnia, unspecified: Secondary | ICD-10-CM | POA: Diagnosis not present

## 2015-11-13 DIAGNOSIS — R05 Cough: Secondary | ICD-10-CM | POA: Diagnosis not present

## 2015-11-13 DIAGNOSIS — F411 Generalized anxiety disorder: Secondary | ICD-10-CM | POA: Diagnosis not present

## 2015-11-26 DIAGNOSIS — H353211 Exudative age-related macular degeneration, right eye, with active choroidal neovascularization: Secondary | ICD-10-CM | POA: Diagnosis not present

## 2015-12-03 ENCOUNTER — Telehealth: Payer: Self-pay | Admitting: Internal Medicine

## 2015-12-03 DIAGNOSIS — H353221 Exudative age-related macular degeneration, left eye, with active choroidal neovascularization: Secondary | ICD-10-CM | POA: Diagnosis not present

## 2015-12-03 NOTE — Telephone Encounter (Signed)
Pt wants to know if she is going to keep taking Eliqius? She has appt on this Thursday. She did not want to get medicine refilled if he was not keeping her on it.

## 2015-12-03 NOTE — Telephone Encounter (Signed)
Eliquis is a long-term medication for her. Please have her refill the medication or provide samples. I will d/w her in the office later this week.  Dr. Lemmie Evens

## 2015-12-03 NOTE — Telephone Encounter (Signed)
Samples provided at front desk and patient aware she may pick up today.

## 2015-12-03 NOTE — Telephone Encounter (Signed)
Spoke to patient. Had inquiry regarding whether she needed to continue her Eliquis post-cardioversion. She has appt w Dr. Debara Pickett on Thursday. She notes she will take her last pill tonight - did not wish to refill for 30 days if she did not need. Notes no problems w/ heart rhythm since cardioversion.   I have made her aware I will seek recommendation from Dr. Debara Pickett, but that he may wait until time of appt for decision on this. Have provided Eliquis samples at front desk which she notes she can pick up today.

## 2015-12-06 ENCOUNTER — Ambulatory Visit (INDEPENDENT_AMBULATORY_CARE_PROVIDER_SITE_OTHER): Payer: Medicare Other | Admitting: Internal Medicine

## 2015-12-06 ENCOUNTER — Encounter: Payer: Self-pay | Admitting: Internal Medicine

## 2015-12-06 VITALS — BP 140/68 | HR 78 | Ht 66.0 in | Wt 146.0 lb

## 2015-12-06 DIAGNOSIS — I10 Essential (primary) hypertension: Secondary | ICD-10-CM

## 2015-12-06 DIAGNOSIS — I4891 Unspecified atrial fibrillation: Secondary | ICD-10-CM | POA: Diagnosis not present

## 2015-12-06 DIAGNOSIS — N289 Disorder of kidney and ureter, unspecified: Secondary | ICD-10-CM

## 2015-12-06 MED ORDER — APIXABAN 2.5 MG PO TABS: 2.5000 mg | ORAL_TABLET | Freq: Two times a day (BID) | ORAL | 6 refills | Status: DC

## 2015-12-06 NOTE — Patient Instructions (Signed)
Your physician wants you to follow-up in: [place] will receive a reminder letter in the mail two months in advance. If you don't receive a letter, please call our office to schedule the follow-up appointment.   If you need a refill on your cardiac medications before your next appointment, please call your pharmacy.

## 2015-12-07 DIAGNOSIS — N289 Disorder of kidney and ureter, unspecified: Secondary | ICD-10-CM | POA: Insufficient documentation

## 2015-12-07 NOTE — Progress Notes (Signed)
OFFICE NOTE  Chief Complaint:  Hospital follow-up  Primary Care Physician: Melinda Crutch, MD  HPI:  Allison Norman is a 82 y.o. female who is coming referred to me for new onset atrial fibrillation. Recently [name] had noted that her heart was fluttering somewhat when she laid on her left side. She presented for evaluation of leg swelling and was noted to have an irregular heartbeat. EKG was performed which showed a new onset atrial fibrillation. She has had somewhat elevated ventricular responses from the 90s to 120s. She was a ready on the metoprolol as well as diltiazem and was placed on Eliquis 2.5 mg twice daily. This is based on age greater than 80 and creatinine of 1.57. [name] is been on this dose for 2 days and has had some bleeding problems. She has a history of thin skin and was on steroids for PMR. She had some bleeding over her left forearm which was dressed today. She denies any chest pain or worsening shortness of breath. 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  10/11/2015  Mrs. Spike returns today for follow-up. She remains in atrial fibrillation with controlled ventricular response. I had increased her diltiazem however this apparently dropped her blood pressure. She was also on losartan. She did not feel well with a low blood pressure and decrease the dose back to her original dose. She's been compliant with her anticoagulation and has been anticoagulated for close to 4 weeks. We discussed elective cardioversion and she is willing to proceed.  12/05/2015  [name] was seen today in follow-up. She had successful cardioversion to sinus rhythm and notes a marked improvement in her energy level. She did not realize how much it was affecting her. She had some confusion as to whether or not she needs to stay on anticoagulation, however I mentioned that should be a lifetime medication for her. She  is maintaining sinus rhythm today at 78.  PMHx:  Past Medical History:  Diagnosis Date  . Cancer ([name])   . GCA (giant cell arteritis) ([name])   . Hypertension   . Macular degeneration   . Osteoporosis   . PMR (polymyalgia rheumatica) ([facility])   . Skin cancer     Past Surgical History:  Procedure Laterality Date  . CARDIOVERSION N/A 10/18/2015   Procedure: CARDIOVERSION;  Surgeon: Jerline Pain, MD;  Location: [facility];  Service: Cardiovascular;  Laterality: N/A;  . KNEE SURGERY  2013  . MASTECTOMY  1998   left side    FAMHx:  Family History  Problem Relation Age of Onset  . Kidney failure Father     died at 38  . Heart disease Sister     died at 14  . Heart disease Sister     died at 87    SOCHx:   reports that she quit smoking about 49 years ago. She has never used smokeless tobacco. She reports that she does not drink alcohol or use drugs.  ALLERGIES:  Allergies  Allergen Reactions  . Codeine   . Penicillins     ROS: Pertinent items noted in HPI and remainder of comprehensive ROS otherwise negative.  HOME MEDS: Current Outpatient Prescriptions  Medication Sig Dispense Refill  . apixaban (ELIQUIS) 2.5 MG TABS tablet Take 1 tablet (2.5 mg total) by mouth 2 (two) times daily. 60 tablet 6  . cloNIDine (CATAPRES) 0.3 MG tablet Take 0.3 mg by  mouth 2 (two) times daily. Per PCP     . diltiazem (CARDIZEM CD) 240 MG 24 hr capsule Take 240 mg by mouth daily.    . furosemide (LASIX) 80 MG tablet Take 0.5 tablets (40 mg total) by mouth daily. Per PCP    . losartan (COZAAR) 100 MG tablet Take 100 mg by mouth daily. Per PCP     . metoprolol succinate (TOPROL-XL) 50 MG 24 hr tablet Take 1 tablet (50 mg total) by mouth daily. Per PCP 30 tablet 6  . traZODone (DESYREL) 50 MG tablet Take 1 tablet by mouth as directed.  0   No current facility-administered medications for this visit.     LABS/IMAGING: No results found for this or any previous visit (from the past 48  hour(s)). No results found.  WEIGHTS: Wt Readings from Last 3 Encounters:  12/06/15 146 lb (66.2 kg)  10/11/15 148 lb 9.6 oz (67.4 kg)  08/31/15 148 lb (67.1 kg)    VITALS: BP 140/68   Pulse 78   Ht 5\' 6"  (1.676 m)   Wt 146 lb (66.2 kg)   BMI 23.57 kg/m   EXAM: General appearance: alert and no distress Lungs: clear to auscultation bilaterally Heart: regular rate and rhythm, S1, S2 normal, no murmur, click, rub or gallop Extremities: extremities normal, atraumatic, no cyanosis or edema Neurologic: Grossly normal  EKG: Sinus rhythm at 78  ASSESSMENT: 1. New onset A. fib-CHADSVASC score of 4 - s/p successful DCCV (10/2015) - on Eliquis 2. Hypertension 3. Leg edema  PLAN: 1.  Mrs. [name] feels much better after cardioversion is maintaining sinus rhythm. She is tolerating Eliquis. Blood pressure is top normal today however has been well controlled at home. No changes to her medicines and I'll see her back in 6 months or sooner as necessary.  Pixie Casino, MD, [facility] Attending Cardiologist [place] [facility] Arelene Moroni 12/07/2015, 4:42 PM

## 2016-01-08 DIAGNOSIS — H353211 Exudative age-related macular degeneration, right eye, with active choroidal neovascularization: Secondary | ICD-10-CM | POA: Diagnosis not present

## 2016-02-19 DIAGNOSIS — H35371 Puckering of macula, right eye: Secondary | ICD-10-CM | POA: Diagnosis not present

## 2016-02-19 DIAGNOSIS — H35052 Retinal neovascularization, unspecified, left eye: Secondary | ICD-10-CM | POA: Diagnosis not present

## 2016-02-19 DIAGNOSIS — H353133 Nonexudative age-related macular degeneration, bilateral, advanced atrophic without subfoveal involvement: Secondary | ICD-10-CM | POA: Diagnosis not present

## 2016-02-19 DIAGNOSIS — H353211 Exudative age-related macular degeneration, right eye, with active choroidal neovascularization: Secondary | ICD-10-CM | POA: Diagnosis not present

## 2016-02-19 DIAGNOSIS — H353221 Exudative age-related macular degeneration, left eye, with active choroidal neovascularization: Secondary | ICD-10-CM | POA: Diagnosis not present

## 2016-03-03 DIAGNOSIS — H353221 Exudative age-related macular degeneration, left eye, with active choroidal neovascularization: Secondary | ICD-10-CM | POA: Diagnosis not present

## 2016-03-06 DIAGNOSIS — M79643 Pain in unspecified hand: Secondary | ICD-10-CM | POA: Diagnosis not present

## 2016-03-06 DIAGNOSIS — I10 Essential (primary) hypertension: Secondary | ICD-10-CM | POA: Diagnosis not present

## 2016-04-01 DIAGNOSIS — H353133 Nonexudative age-related macular degeneration, bilateral, advanced atrophic without subfoveal involvement: Secondary | ICD-10-CM | POA: Diagnosis not present

## 2016-04-01 DIAGNOSIS — H26492 Other secondary cataract, left eye: Secondary | ICD-10-CM | POA: Diagnosis not present

## 2016-04-01 DIAGNOSIS — H353211 Exudative age-related macular degeneration, right eye, with active choroidal neovascularization: Secondary | ICD-10-CM | POA: Diagnosis not present

## 2016-04-01 DIAGNOSIS — H353221 Exudative age-related macular degeneration, left eye, with active choroidal neovascularization: Secondary | ICD-10-CM | POA: Diagnosis not present

## 2016-04-22 DIAGNOSIS — H26492 Other secondary cataract, left eye: Secondary | ICD-10-CM | POA: Diagnosis not present

## 2016-05-12 ENCOUNTER — Other Ambulatory Visit: Payer: Self-pay | Admitting: Family Medicine

## 2016-05-12 DIAGNOSIS — Z1231 Encounter for screening mammogram for malignant neoplasm of breast: Secondary | ICD-10-CM

## 2016-05-13 DIAGNOSIS — H353133 Nonexudative age-related macular degeneration, bilateral, advanced atrophic without subfoveal involvement: Secondary | ICD-10-CM | POA: Diagnosis not present

## 2016-05-13 DIAGNOSIS — H353211 Exudative age-related macular degeneration, right eye, with active choroidal neovascularization: Secondary | ICD-10-CM | POA: Diagnosis not present

## 2016-05-20 DIAGNOSIS — D649 Anemia, unspecified: Secondary | ICD-10-CM | POA: Diagnosis not present

## 2016-05-20 DIAGNOSIS — F411 Generalized anxiety disorder: Secondary | ICD-10-CM | POA: Diagnosis not present

## 2016-05-20 DIAGNOSIS — E781 Pure hyperglyceridemia: Secondary | ICD-10-CM | POA: Diagnosis not present

## 2016-05-20 DIAGNOSIS — M79643 Pain in unspecified hand: Secondary | ICD-10-CM | POA: Diagnosis not present

## 2016-05-20 DIAGNOSIS — M353 Polymyalgia rheumatica: Secondary | ICD-10-CM | POA: Diagnosis not present

## 2016-05-20 DIAGNOSIS — Z0000 Encounter for general adult medical examination without abnormal findings: Secondary | ICD-10-CM | POA: Diagnosis not present

## 2016-05-20 DIAGNOSIS — I10 Essential (primary) hypertension: Secondary | ICD-10-CM | POA: Diagnosis not present

## 2016-05-27 DIAGNOSIS — H353133 Nonexudative age-related macular degeneration, bilateral, advanced atrophic without subfoveal involvement: Secondary | ICD-10-CM | POA: Diagnosis not present

## 2016-05-27 DIAGNOSIS — H353211 Exudative age-related macular degeneration, right eye, with active choroidal neovascularization: Secondary | ICD-10-CM | POA: Diagnosis not present

## 2016-05-27 DIAGNOSIS — H353212 Exudative age-related macular degeneration, right eye, with inactive choroidal neovascularization: Secondary | ICD-10-CM | POA: Diagnosis not present

## 2016-05-27 DIAGNOSIS — H35352 Cystoid macular degeneration, left eye: Secondary | ICD-10-CM | POA: Diagnosis not present

## 2016-05-28 ENCOUNTER — Ambulatory Visit
Admission: RE | Admit: 2016-05-28 | Discharge: 2016-05-28 | Disposition: A | Discharge status: Home / Self Care | Payer: Medicare Other | Source: Ambulatory Visit | Attending: Family Medicine | Admitting: Family Medicine

## 2016-05-28 DIAGNOSIS — Z1231 Encounter for screening mammogram for malignant neoplasm of breast: Secondary | ICD-10-CM | POA: Diagnosis not present

## 2016-06-04 ENCOUNTER — Ambulatory Visit: Payer: Medicare Other | Admitting: Internal Medicine

## 2016-06-25 DIAGNOSIS — H353112 Nonexudative age-related macular degeneration, right eye, intermediate dry stage: Secondary | ICD-10-CM | POA: Diagnosis not present

## 2016-06-25 DIAGNOSIS — H353222 Exudative age-related macular degeneration, left eye, with inactive choroidal neovascularization: Secondary | ICD-10-CM | POA: Diagnosis not present

## 2016-06-25 DIAGNOSIS — H353122 Nonexudative age-related macular degeneration, left eye, intermediate dry stage: Secondary | ICD-10-CM | POA: Diagnosis not present

## 2016-06-25 DIAGNOSIS — H353211 Exudative age-related macular degeneration, right eye, with active choroidal neovascularization: Secondary | ICD-10-CM | POA: Diagnosis not present

## 2016-07-04 ENCOUNTER — Ambulatory Visit: Payer: Medicare Other | Admitting: Internal Medicine

## 2016-07-07 ENCOUNTER — Other Ambulatory Visit: Payer: Self-pay | Admitting: Internal Medicine

## 2016-07-07 DIAGNOSIS — I4891 Unspecified atrial fibrillation: Secondary | ICD-10-CM

## 2016-07-10 ENCOUNTER — Ambulatory Visit (INDEPENDENT_AMBULATORY_CARE_PROVIDER_SITE_OTHER): Payer: Medicare Other | Admitting: Internal Medicine

## 2016-07-10 ENCOUNTER — Encounter: Payer: Self-pay | Admitting: Internal Medicine

## 2016-07-10 VITALS — BP 140/74 | HR 78 | Ht 66.0 in | Wt 150.8 lb

## 2016-07-10 DIAGNOSIS — Z79899 Other long term (current) drug therapy: Secondary | ICD-10-CM | POA: Diagnosis not present

## 2016-07-10 DIAGNOSIS — I10 Essential (primary) hypertension: Secondary | ICD-10-CM | POA: Diagnosis not present

## 2016-07-10 DIAGNOSIS — I480 Paroxysmal atrial fibrillation: Secondary | ICD-10-CM | POA: Diagnosis not present

## 2016-07-10 DIAGNOSIS — Z7901 Long term (current) use of anticoagulants: Secondary | ICD-10-CM

## 2016-07-10 NOTE — Patient Instructions (Addendum)
Your physician wants you to follow-up in: ONE YEAR with Dr. Debara Pickett. You will receive a reminder letter in the mail two months in advance. If you don't receive a letter, please call our office to schedule the follow-up appointment.  Your physician recommends that you return for lab work TODAY

## 2016-07-10 NOTE — Progress Notes (Signed)
OFFICE NOTE  Chief Complaint:  Hospital follow-up  Primary Care Physician: [name], MD  HPI:  Allison Norman is a 83 y.o. female who is coming referred to me for new onset atrial fibrillation. Recently Mrs. Socorro had noted that her heart was fluttering somewhat when she laid on her left side. She presented for evaluation of leg swelling and was noted to have an irregular heartbeat. EKG was performed which showed a new onset atrial fibrillation. She has had somewhat elevated ventricular responses from the 90s to 120s. She was a ready on the metoprolol as well as diltiazem and was placed on Eliquis 2.5 mg twice daily. This is based on age greater than 11 and creatinine of 1.57. Mrs. Hartis is been on this dose for 2 days and has had some bleeding problems. She has a history of thin skin and was on steroids for PMR. She had some bleeding over her left forearm which was dressed today. She denies any chest pain or worsening shortness of breath. 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  10/11/2015  Mrs. Foody returns today for follow-up. She remains in atrial fibrillation with controlled ventricular response. I had increased her diltiazem however this apparently dropped her blood pressure. She was also on losartan. She did not feel well with a low blood pressure and decrease the dose back to her original dose. She's been compliant with her anticoagulation and has been anticoagulated for close to 4 weeks. We discussed elective cardioversion and she is willing to proceed.  12/05/2015  Mrs. Pendley was seen today in follow-up. She had successful cardioversion to sinus rhythm and notes a marked improvement in her energy level. She did not realize how much it was affecting her. She had some confusion as to whether or not she needs to stay on anticoagulation, however I mentioned that should be a lifetime medication for  her. She is maintaining sinus rhythm today at 78.  07/10/2016  Mrs. Rhatigan returns for follow-up. She is doing well back in sinus rhythm. She is on a was for CHADSVASC score 4. She says she feels markedly better in sinus rhythm and therefore she should be able to monitor for symptoms and recurrence. Blood pressure is at goal today. Initially was elevated however recheck came down to 140/74. She has had some leg edema in the past which is basically resolved.  PMHx:  Past Medical History:  Diagnosis Date  . Cancer ([place])   . GCA (giant cell arteritis) ([place])   . Hypertension   . Macular degeneration   . Osteoporosis   . PMR (polymyalgia rheumatica) ([facility])   . Skin cancer     Past Surgical History:  Procedure Laterality Date  . CARDIOVERSION N/A 10/18/2015   Procedure: CARDIOVERSION;  Surgeon: Jerline Pain, MD;  Location: [place];  Service: Cardiovascular;  Laterality: N/A;  . KNEE SURGERY  2013  . MASTECTOMY  1998   left side    FAMHx:  Family History  Problem Relation Age of Onset  . Kidney failure Father        died at 63  . Heart disease Sister        died at 28  . Heart disease Sister        died at 30  . Breast cancer Daughter     SOCHx:   reports that she quit smoking about 49 years ago. She has never  used smokeless tobacco. She reports that she does not drink alcohol or use drugs.  ALLERGIES:  Allergies  Allergen Reactions  . Codeine   . Penicillins     ROS: Pertinent items noted in HPI and remainder of comprehensive ROS otherwise negative.  HOME MEDS: Current Outpatient Prescriptions  Medication Sig Dispense Refill  . cloNIDine (CATAPRES) 0.3 MG tablet Take 0.3 mg by mouth 2 (two) times daily. Per PCP     . diazepam (VALIUM) 2 MG tablet at bedtime.  0  . diltiazem (CARDIZEM CD) 240 MG 24 hr capsule Take 240 mg by mouth daily.    [name] ELIQUIS 2.5 MG TABS tablet TAKE ONE TABLET BY MOUTH TWICE DAILY 60 tablet 0  . furosemide (LASIX) 80 MG tablet Take 0.5  tablets (40 mg total) by mouth daily. Per PCP    . losartan (COZAAR) 100 MG tablet Take 100 mg by mouth daily. Per PCP     . metoprolol succinate (TOPROL-XL) 50 MG 24 hr tablet Take 1 tablet (50 mg total) by mouth daily. Per PCP 30 tablet 6  . predniSONE (DELTASONE) 5 MG tablet Take 2.5 mg by mouth daily.  1   No current facility-administered medications for this visit.     LABS/IMAGING: No results found for this or any previous visit (from the past 48 hour(s)). No results found.  WEIGHTS: Wt Readings from Last 3 Encounters:  07/10/16 150 lb 12.8 oz (68.4 kg)  12/06/15 146 lb (66.2 kg)  10/11/15 148 lb 9.6 oz (67.4 kg)    VITALS: BP 140/74   Pulse 78   Ht 5\' 6"  (1.676 m)   Wt 150 lb 12.8 oz (68.4 kg)   BMI 24.34 kg/m   EXAM: General appearance: alert and no distress Neck: no carotid bruit, no JVD and thyroid not enlarged, symmetric, no tenderness/mass/nodules Lungs: clear to auscultation bilaterally Heart: regular rate and rhythm, S1, S2 normal, no murmur, click, rub or gallop Abdomen: soft, non-tender; bowel sounds normal; no masses,  no organomegaly Extremities: extremities normal, atraumatic, no cyanosis or edema Pulses: 2+ and symmetric Skin: Skin color, texture, turgor normal. No rashes or lesions Neurologic: Grossly normal Psych: Pleasant  EKG: Sinus rhythm with first-degree AV block at 78  ASSESSMENT: 1. New onset A. fib-CHADSVASC score of 4 - s/p successful DCCV (10/2015) - on Eliquis 2. Hypertension 3. Leg edema  PLAN: 1.  Mrs. Broda continues to maintain sinus rhythm with improvement in her symptoms. Blood pressure is reasonably controlled given the number medication she's on. She denies any chest pain or worsening shortness of breath. No bleeding problems on L a course. She'll need to remain on this indefinitely. Plan follow-up with me annually or sooner as necessary.  Pixie Casino, MD, [facility] Attending Cardiologist [place]  [place] 07/10/2016, 3:33 PM

## 2016-07-11 LAB — CBC
Hematocrit: 40.7 % (ref 34.0–46.6)
Hemoglobin: 13.3 g/dL (ref 11.1–15.9)
MCH: 30.7 pg (ref 26.6–33.0)
MCHC: 32.7 g/dL (ref 31.5–35.7)
MCV: 94 fL (ref 79–97)
PLATELETS: 226 10*3/uL (ref 150–379)
RBC: 4.33 x10E6/uL (ref 3.77–5.28)
RDW: 14.4 % (ref 12.3–15.4)
WBC: 7.4 10*3/uL (ref 3.4–10.8)

## 2016-07-11 LAB — BASIC METABOLIC PANEL
BUN / CREAT RATIO: 20 (ref 12–28)
BUN: 30 mg/dL — ABNORMAL HIGH (ref 8–27)
CHLORIDE: 100 mmol/L (ref 96–106)
CO2: 28 mmol/L (ref 20–29)
Calcium: 9.5 mg/dL (ref 8.7–10.3)
Creatinine, Ser: 1.49 mg/dL — ABNORMAL HIGH (ref 0.57–1.00)
GFR calc non Af Amer: 31 mL/min/{1.73_m2} — ABNORMAL LOW (ref >59)
GFR, EST AFRICAN AMERICAN: 36 mL/min/{1.73_m2} — AB (ref >59)
GLUCOSE: 131 mg/dL — AB (ref 65–99)
POTASSIUM: 4.6 mmol/L (ref 3.5–5.2)
SODIUM: 144 mmol/L (ref 134–144)

## 2016-07-14 ENCOUNTER — Telehealth: Payer: Self-pay | Admitting: Internal Medicine

## 2016-07-14 NOTE — Telephone Encounter (Signed)
Ok to provide letter stating no antibiotic prophylaxis needed prior to dental work. She is on Eliquis - if significant bleeding is expected, ok to hold Eliquis for 2 days prior to procedure and restart after. She will need to stay on Eliquis indefinitely.  Dr. Lemmie Evens

## 2016-07-14 NOTE — Telephone Encounter (Signed)
This note printed and faxed to dentist off as noted below.

## 2016-07-14 NOTE — Telephone Encounter (Signed)
Cathy calling with Dr. Vivia Ewing dental office, she is requesting we fax over paperwork that states that patient no longer needs to take pre-medication and also that  She can disconitnue blood thinner. Tye [place] states that the patient told her this information. Dr. Davina Poke fax number [phone]

## 2016-07-14 NOTE — Telephone Encounter (Signed)
Message routed to MD to advise

## 2016-08-04 ENCOUNTER — Other Ambulatory Visit: Payer: Self-pay | Admitting: Internal Medicine

## 2016-08-04 DIAGNOSIS — I4891 Unspecified atrial fibrillation: Secondary | ICD-10-CM

## 2016-08-05 DIAGNOSIS — L570 Actinic keratosis: Secondary | ICD-10-CM | POA: Diagnosis not present

## 2016-08-05 DIAGNOSIS — C44619 Basal cell carcinoma of skin of left upper limb, including shoulder: Secondary | ICD-10-CM | POA: Diagnosis not present

## 2016-08-05 DIAGNOSIS — D485 Neoplasm of uncertain behavior of skin: Secondary | ICD-10-CM | POA: Diagnosis not present

## 2016-08-05 DIAGNOSIS — Z85828 Personal history of other malignant neoplasm of skin: Secondary | ICD-10-CM | POA: Diagnosis not present

## 2016-08-05 DIAGNOSIS — L821 Other seborrheic keratosis: Secondary | ICD-10-CM | POA: Diagnosis not present

## 2016-08-05 DIAGNOSIS — D1801 Hemangioma of skin and subcutaneous tissue: Secondary | ICD-10-CM | POA: Diagnosis not present

## 2016-08-05 DIAGNOSIS — L814 Other melanin hyperpigmentation: Secondary | ICD-10-CM | POA: Diagnosis not present

## 2016-08-05 DIAGNOSIS — D2272 Melanocytic nevi of left lower limb, including hip: Secondary | ICD-10-CM | POA: Diagnosis not present

## 2016-08-11 DIAGNOSIS — H353211 Exudative age-related macular degeneration, right eye, with active choroidal neovascularization: Secondary | ICD-10-CM | POA: Diagnosis not present

## 2016-08-18 ENCOUNTER — Encounter (HOSPITAL_COMMUNITY): Payer: Self-pay

## 2016-08-18 ENCOUNTER — Inpatient Hospital Stay (HOSPITAL_COMMUNITY)
Admission: EM | Admit: 2016-08-18 | Discharge: 2016-08-20 | DRG: 309 | Disposition: A | Discharge status: Home / Self Care | Payer: Medicare Other | Attending: Cardiology | Admitting: Cardiology

## 2016-08-18 ENCOUNTER — Telehealth: Payer: Self-pay | Admitting: Internal Medicine

## 2016-08-18 DIAGNOSIS — I4891 Unspecified atrial fibrillation: Secondary | ICD-10-CM | POA: Diagnosis not present

## 2016-08-18 DIAGNOSIS — Z853 Personal history of malignant neoplasm of breast: Secondary | ICD-10-CM | POA: Diagnosis not present

## 2016-08-18 DIAGNOSIS — I480 Paroxysmal atrial fibrillation: Principal | ICD-10-CM | POA: Diagnosis present

## 2016-08-18 DIAGNOSIS — I129 Hypertensive chronic kidney disease with stage 1 through stage 4 chronic kidney disease, or unspecified chronic kidney disease: Secondary | ICD-10-CM | POA: Diagnosis present

## 2016-08-18 DIAGNOSIS — N183 Chronic kidney disease, stage 3 (moderate): Secondary | ICD-10-CM | POA: Diagnosis not present

## 2016-08-18 DIAGNOSIS — N184 Chronic kidney disease, stage 4 (severe): Secondary | ICD-10-CM | POA: Diagnosis present

## 2016-08-18 DIAGNOSIS — M810 Age-related osteoporosis without current pathological fracture: Secondary | ICD-10-CM | POA: Diagnosis present

## 2016-08-18 DIAGNOSIS — I10 Essential (primary) hypertension: Secondary | ICD-10-CM | POA: Diagnosis present

## 2016-08-18 DIAGNOSIS — I248 Other forms of acute ischemic heart disease: Secondary | ICD-10-CM | POA: Diagnosis present

## 2016-08-18 DIAGNOSIS — Z885 Allergy status to narcotic agent status: Secondary | ICD-10-CM

## 2016-08-18 DIAGNOSIS — I4819 Other persistent atrial fibrillation: Secondary | ICD-10-CM | POA: Diagnosis present

## 2016-08-18 DIAGNOSIS — Z87891 Personal history of nicotine dependence: Secondary | ICD-10-CM

## 2016-08-18 DIAGNOSIS — H3530 Unspecified macular degeneration: Secondary | ICD-10-CM | POA: Diagnosis not present

## 2016-08-18 DIAGNOSIS — M316 Other giant cell arteritis: Secondary | ICD-10-CM | POA: Diagnosis present

## 2016-08-18 DIAGNOSIS — Z7901 Long term (current) use of anticoagulants: Secondary | ICD-10-CM

## 2016-08-18 DIAGNOSIS — I272 Pulmonary hypertension, unspecified: Secondary | ICD-10-CM | POA: Diagnosis present

## 2016-08-18 DIAGNOSIS — Z79899 Other long term (current) drug therapy: Secondary | ICD-10-CM

## 2016-08-18 DIAGNOSIS — M353 Polymyalgia rheumatica: Secondary | ICD-10-CM | POA: Diagnosis present

## 2016-08-18 DIAGNOSIS — I340 Nonrheumatic mitral (valve) insufficiency: Secondary | ICD-10-CM | POA: Diagnosis present

## 2016-08-18 DIAGNOSIS — Z85828 Personal history of other malignant neoplasm of skin: Secondary | ICD-10-CM

## 2016-08-18 DIAGNOSIS — Z880 Allergy status to penicillin: Secondary | ICD-10-CM

## 2016-08-18 DIAGNOSIS — R000 Tachycardia, unspecified: Secondary | ICD-10-CM | POA: Diagnosis not present

## 2016-08-18 HISTORY — DX: Chronic kidney disease, unspecified: N18.9

## 2016-08-18 HISTORY — DX: Unspecified atrial fibrillation: I48.91

## 2016-08-18 LAB — CBC
HEMATOCRIT: 46 % (ref 36.0–46.0)
HEMOGLOBIN: 15.2 g/dL — AB (ref 12.0–15.0)
MCH: 31 pg (ref 26.0–34.0)
MCHC: 33 g/dL (ref 30.0–36.0)
MCV: 93.9 fL (ref 78.0–100.0)
Platelets: 242 10*3/uL (ref 150–400)
RBC: 4.9 MIL/uL (ref 3.87–5.11)
RDW: 14.2 % (ref 11.5–15.5)
WBC: 8.1 10*3/uL (ref 4.0–10.5)

## 2016-08-18 LAB — BASIC METABOLIC PANEL
Anion gap: 13 (ref 5–15)
BUN: 31 mg/dL — AB (ref 6–20)
CHLORIDE: 104 mmol/L (ref 101–111)
CO2: 22 mmol/L (ref 22–32)
CREATININE: 1.46 mg/dL — AB (ref 0.44–1.00)
Calcium: 9.2 mg/dL (ref 8.9–10.3)
GFR calc Af Amer: 35 mL/min — ABNORMAL LOW (ref >60)
GFR calc non Af Amer: 30 mL/min — ABNORMAL LOW (ref >60)
Glucose, Bld: 132 mg/dL — ABNORMAL HIGH (ref 65–99)
POTASSIUM: 3.5 mmol/L (ref 3.5–5.1)
Sodium: 139 mmol/L (ref 135–145)

## 2016-08-18 LAB — I-STAT TROPONIN, ED: Troponin i, poc: 0.04 ng/mL (ref 0.00–0.08)

## 2016-08-18 LAB — MAGNESIUM: MAGNESIUM: 2.2 mg/dL (ref 1.7–2.4)

## 2016-08-18 MED ORDER — DILTIAZEM LOAD VIA INFUSION
10.0000 mg | Freq: Once | INTRAVENOUS | Status: AC
  Administered 2016-08-18: 10 mg via INTRAVENOUS
  Filled 2016-08-18: qty 10

## 2016-08-18 MED ORDER — DILTIAZEM LOAD VIA INFUSION: 10.0000 mg | Freq: Once | INTRAVENOUS | Status: DC

## 2016-08-18 MED ORDER — DILTIAZEM HCL 100 MG IV SOLR
5.0000 mg/h | INTRAVENOUS | Status: DC
  Administered 2016-08-18: 5 mg/h via INTRAVENOUS
  Administered 2016-08-19: 7.5 mg/h via INTRAVENOUS
  Filled 2016-08-18 (×3): qty 100

## 2016-08-18 MED ORDER — DILTIAZEM HCL 100 MG IV SOLR: 5.0000 mg/h | INTRAVENOUS | Status: DC

## 2016-08-18 MED ORDER — DILTIAZEM HCL 25 MG/5ML IV SOLN
10.0000 mg | Freq: Once | INTRAVENOUS | Status: AC
  Administered 2016-08-18: 10 mg via INTRAVENOUS

## 2016-08-18 MED ORDER — APIXABAN 2.5 MG PO TABS
2.5000 mg | ORAL_TABLET | Freq: Two times a day (BID) | ORAL | Status: DC
  Administered 2016-08-18 – 2016-08-20 (×4): 2.5 mg via ORAL
  Filled 2016-08-18 (×5): qty 1

## 2016-08-18 MED ORDER — SODIUM CHLORIDE 0.9 % IV BOLUS (SEPSIS)
500.0000 mL | Freq: Once | INTRAVENOUS | Status: AC
  Administered 2016-08-18: 500 mL via INTRAVENOUS

## 2016-08-18 NOTE — ED Provider Notes (Signed)
[facility] DEPT Provider Note   CSN: [id number] Arrival date & time: 08/18/16  1518     History   Chief Complaint Chief Complaint  Patient presents with  . Hypertension    HPI Allison Norman is a 83 y.o. female.  HPI  83 year old female presents for evaluation of elevated heart rate and hypertension. Over the last 24 hours or so her blood pressure has been quite high with a systolic around 259 and diastolic up to 563. She checks her blood pressure sometimes in the afternoon before she will take clonidine so she will know whether or not to take a half or whole pill. However today she noticed her heart rate was 130. She did not feel any particular symptoms. No dizziness, lightheadedness, chest pain, or shortness of breath. Sometimes she will get a "nervous feeling". At this time while her heart rate is 130 she has no symptoms. She has a history of A. fib in the past. She is on Eliquis and decides not taking her nightly medicine because of being in the ER she has not missed any doses of this or her heart rate medicines.    Past Medical History:  Diagnosis Date  . Cancer ([place])   . GCA (giant cell arteritis) ([place])   . Hypertension   . Macular degeneration   . Osteoporosis   . PMR (polymyalgia rheumatica) ([facility])   . Skin cancer     Patient Active Problem List   Diagnosis Date Noted  . On anticoagulant therapy 07/10/2016  . Renal insufficiency 12/07/2015  . PAF (paroxysmal atrial fibrillation) ([place])   . Atrial fibrillation ([place]) 08/31/2015  . CKD (chronic kidney disease) 08/31/2015  . Essential hypertension 07/16/2009  . TEMPORAL ARTERITIS 07/16/2009  . POLYMYALGIA RHEUMATICA 07/16/2009  . MRSA 07/02/2009  . PYOGENIC ARTHRITIS, LOWER LEG 07/02/2009    Past Surgical History:  Procedure Laterality Date  . CARDIOVERSION N/A 10/18/2015   Procedure: CARDIOVERSION;  Surgeon: Jerline Pain, MD;  Location: [facility];  Service: Cardiovascular;  Laterality: N/A;  . KNEE  SURGERY  2013  . MASTECTOMY  1998   left side    OB History    No data available       Home Medications    Prior to Admission medications   Medication Sig Start Date End Date Taking? Authorizing Provider  cloNIDine (CATAPRES) 0.3 MG tablet Take 0.3 mg by mouth 3 (three) times daily. Per PCP    Yes [provider]  diazepam (VALIUM) 2 MG tablet Take 1-2 mg by mouth at bedtime as needed (sleep).  06/23/16  Yes [provider]  diltiazem (CARDIZEM CD) 240 MG 24 hr capsule Take 240 mg by mouth daily.   Yes [provider]  ELIQUIS 2.5 MG TABS tablet TAKE 1 TABLET BY MOUTH TWICE DAILY Patient taking differently: TAKE 1 TABLET (2.5 MG) BY MOUTH TWICE DAILY 08/05/16  Yes Hilty, Nadean Corwin, MD  furosemide (LASIX) 80 MG tablet Take 0.5 tablets (40 mg total) by mouth daily. Per PCP 10/12/15  Yes Hilty, Nadean Corwin, MD  losartan (COZAAR) 100 MG tablet Take 100 mg by mouth daily. Per PCP    Yes [provider]  Melatonin 5 MG TABS Take 5 mg by mouth at bedtime.   Yes [provider]  metoprolol succinate (TOPROL-XL) 50 MG 24 hr tablet Take 1 tablet (50 mg total) by mouth daily. Per PCP 10/18/15  Yes Jerline Pain, MD  Multiple Vitamins-Minerals (PRESERVISION AREDS 2) CAPS Take 1  capsule by mouth daily.   Yes [provider]  polyethylene glycol (MIRALAX / GLYCOLAX) packet Take 17 g by mouth daily as needed (constipation). Mix in 8 oz liquid and drink   Yes [provider]    Family History Family History  Problem Relation Age of Onset  . Kidney failure Father        died at 20  . Heart disease Sister        died at 70  . Heart disease Sister        died at 73  . Breast cancer Daughter     Social History Social History  Substance Use Topics  . Smoking status: Former Smoker    Quit date: 08/31/1966  . Smokeless tobacco: Never Used  . Alcohol use No     Allergies   Codeine and Penicillins   Review of Systems Review of  Systems  Constitutional: Negative for fever.  Respiratory: Negative for shortness of breath.   Cardiovascular: Negative for chest pain and palpitations.  Neurological: Negative for dizziness and light-headedness.  All other systems reviewed and are negative.    Physical Exam Updated Vital Signs BP (!) 157/71   Pulse 73   Temp 98 F (36.7 C) (Oral)   Resp (!) 23   Ht 5\' 6"  (1.676 m)   Wt 63.5 kg (140 lb)   SpO2 95%   BMI 22.60 kg/m   Physical Exam  Constitutional: She is oriented to person, place, and time. She appears well-developed and well-nourished.  HENT:  Head: Normocephalic and atraumatic.  Right Ear: External ear normal.  Left Ear: External ear normal.  Nose: Nose normal.  Eyes: Right eye exhibits no discharge. Left eye exhibits no discharge.  Cardiovascular: Normal heart sounds.  An irregular rhythm present. Tachycardia present.   Pulmonary/Chest: Effort normal and breath sounds normal.  Abdominal: Soft. There is no tenderness.  Neurological: She is alert and oriented to person, place, and time.  Skin: Skin is warm and dry.  Nursing note and vitals reviewed.    ED Treatments / Results  Labs (all labs ordered are listed, but only abnormal results are displayed) Labs Reviewed  BASIC METABOLIC PANEL - Abnormal; Notable for the following:       Result Value   Glucose, Bld 132 (*)    BUN 31 (*)    Creatinine, Ser 1.46 (*)    GFR calc non Af Amer 30 (*)    GFR calc Af Amer 35 (*)    All other components within normal limits  CBC - Abnormal; Notable for the following:    Hemoglobin 15.2 (*)    All other components within normal limits  MAGNESIUM  I-STAT TROPONIN, ED    EKG  EKG Interpretation  Date/Time:  Monday August 18 2016 20:36:45 EDT Ventricular Rate:  129 PR Interval:  84 QRS Duration: 86 QT Interval:  318 QTC Calculation: 466 R Axis:   8 Text Interpretation:  Atrial flutter with varied AV block, LVH with secondary repolarization abnormality  Anterior Q waves, possibly due to LVH ST depression, probably rate related rate is faster compared to earlier in the day Confirmed by Sherwood Gambler [id number]) on 08/18/2016 9:22:40 PM       Radiology No results found.  Procedures Procedures (including critical care time)  CRITICAL CARE Performed by: Sherwood Gambler T   Total critical care time: 30 minutes  Critical care time was exclusive of separately billable procedures and treating other patients.  Critical care  was necessary to treat or prevent imminent or life-threatening deterioration.  Critical care was time spent personally by me on the following activities: development of treatment plan with patient and/or surrogate as well as nursing, discussions with consultants, evaluation of patient's response to treatment, examination of patient, obtaining history from patient or surrogate, ordering and performing treatments and interventions, ordering and review of laboratory studies, ordering and review of radiographic studies, pulse oximetry and re-evaluation of patient's condition.   Medications Ordered in ED Medications  diltiazem (CARDIZEM) 1 mg/mL load via infusion 10 mg (10 mg Intravenous Bolus from Bag 08/18/16 2130)    And  diltiazem (CARDIZEM) 100 mg in dextrose 5 % 100 mL (1 mg/mL) infusion (7.5 mg/hr Intravenous Rate/Dose Change 08/18/16 2201)  apixaban (ELIQUIS) tablet 2.5 mg (2.5 mg Oral Given 08/18/16 2242)  sodium chloride 0.9 % bolus 500 mL (500 mLs Intravenous New Bag/Given 08/18/16 2200)  diltiazem (CARDIZEM) injection 10 mg (10 mg Intravenous Bolus 08/18/16 2242)     Initial Impression / Assessment and Plan / ED Course  I have reviewed the triage vital signs and the nursing notes.  Pertinent labs & imaging results that were available during my care of the patient were reviewed by me and considered in my medical decision making (see chart for details).     Heart rate is tachycardic in the 130s after coming back into  the ER. She is quite hypertensive as well. However she is asymptomatic. Given the A. fib with RVR, she was started on IV diltiazem and IV diltiazem drip. This has helped her blood pressure little bit and her heart rate is better controlled. Discussed with cardiology, Dr. Percival Spanish, who will come eval for admission given she is still on an IV drip.  Final Clinical Impressions(s) / ED Diagnoses   Final diagnoses:  Atrial fibrillation with RVR [facility])    New Prescriptions New Prescriptions   No medications on file     Sherwood Gambler, MD 08/18/16 2334

## 2016-08-18 NOTE — ED Triage Notes (Signed)
  Pt arrives POV from home reporting the last 2 days she has been experiencing HTN and high hr. Denies any chest pain or sob. PT reports bp was 178/108 and hr 130at home. Pt has hx of afib rvr. HR 71 bp 127/74 in triage

## 2016-08-18 NOTE — H&P (Signed)
CARDIOLOGY CONSULT NOTE  Patient ID: Allison Norman MRN: [id number] DOB/AGE: August 01, 1924 83 y.o.  Admit date: 08/18/2016 Primary Physician [name], MD Primary Cardiologist   Dr. Debara Pickett Chief Complaint  Rapid Heart Rate Requesting  Dr. Regenia Skeeter  HPI:  The patient has a history of atrial fib.  She has been managed by Dr. Debara Pickett and had been on Eliquis.  She had cardioversion.   She was in sinus at the last visit in July.  However, for the past couple of days she noted that her heart rate was elevated when she would take her BP.  She does not feel this. The patient denies any new symptoms such as chest discomfort, neck or arm discomfort. There has been no new shortness of breath, PND or orthopnea. There have been no reported palpitations, presyncope or syncope.  She has otherwise felt well.   Past Medical History:  Diagnosis Date  . Cancer ([place])    Breast  . GCA (giant cell arteritis) ([place])   . Hypertension   . Macular degeneration   . Osteoporosis   . PMR (polymyalgia rheumatica) ([facility])   . Skin cancer     Past Surgical History:  Procedure Laterality Date  . CARDIOVERSION N/A 10/18/2015   Procedure: CARDIOVERSION;  Surgeon: Jerline Pain, MD;  Location: [facility];  Service: Cardiovascular;  Laterality: N/A;  . KNEE SURGERY  2013  . MASTECTOMY  1998   left side    Allergies  Allergen Reactions  . Codeine Itching  . Penicillins Itching and Rash    Has patient had a PCN reaction causing immediate rash, facial/tongue/throat swelling, SOB or lightheadedness with hypotension: Yes Has patient had a PCN reaction causing severe rash involving mucus membranes or skin necrosis: No Has patient had a PCN reaction that required hospitalization: No Has patient had a PCN reaction occurring within the last 10 years: No If all of the above answers are "NO", then may proceed with Cephalosporin use.   Prior to Admission medications   Medication Sig Start Date End Date Taking?  Authorizing Provider  cloNIDine (CATAPRES) 0.3 MG tablet Take 0.3 mg by mouth 3 (three) times daily. Per PCP    Yes [provider]  diazepam (VALIUM) 2 MG tablet Take 1-2 mg by mouth at bedtime as needed (sleep).  06/23/16  Yes [provider]  diltiazem (CARDIZEM CD) 240 MG 24 hr capsule Take 240 mg by mouth daily.   Yes [provider]  ELIQUIS 2.5 MG TABS tablet TAKE 1 TABLET BY MOUTH TWICE DAILY Patient taking differently: TAKE 1 TABLET (2.5 MG) BY MOUTH TWICE DAILY 08/05/16  Yes Hilty, Nadean Corwin, MD  furosemide (LASIX) 80 MG tablet Take 0.5 tablets (40 mg total) by mouth daily. Per PCP 10/12/15  Yes Hilty, Nadean Corwin, MD  losartan (COZAAR) 100 MG tablet Take 100 mg by mouth daily. Per PCP    Yes [provider]  Melatonin 5 MG TABS Take 5 mg by mouth at bedtime.   Yes [provider]  metoprolol succinate (TOPROL-XL) 50 MG 24 hr tablet Take 1 tablet (50 mg total) by mouth daily. Per PCP 10/18/15  Yes Jerline Pain, MD  Multiple Vitamins-Minerals (PRESERVISION AREDS 2) CAPS Take 1 capsule by mouth daily.   Yes [provider]  polyethylene glycol (MIRALAX / GLYCOLAX) packet Take 17 g by mouth daily as needed (constipation). Mix in 8 oz liquid and drink   Yes [provider]   '  (Not  in a hospital admission) Family History  Problem Relation Age of Onset  . Stroke Mother   . Kidney failure Father        died at 37  . Heart disease Sister        died at 33  . Heart disease Sister        died at 60  . Breast cancer Daughter     Social History   Social History  . Marital status: Married    Spouse name: N/A  . Number of children: 2  . Years of education: N/A   Occupational History  . Not on file.   Social History Main Topics  . Smoking status: Former Smoker    Quit date: 08/31/1966  . Smokeless tobacco: Never Used  . Alcohol use No  . Drug use: No  . Sexual activity: Not on file   Other Topics Concern  . Not on  file   Social History Narrative   epworth sleepiness scale = 8 (08/31/15)     ROS:    As stated in the HPI and negative for all other systems.  Physical Exam: Blood pressure (!) 170/92, pulse 79, temperature 98 F (36.7 C), temperature source Oral, resp. rate 19, height 5\' 6"  (1.676 m), weight 140 lb (63.5 kg), SpO2 95 %.  GENERAL:  Well appearing HEENT:  Pupils equal round and reactive, fundi not visualized, oral mucosa unremarkable NECK:  No jugular venous distention, waveform within normal limits, carotid upstroke brisk and symmetric, no bruits, no thyromegaly LYMPHATICS:  No cervical, inguinal adenopathy LUNGS:  Clear to auscultation bilaterally BACK:  No CVA tenderness CHEST:  Unremarkable HEART:  PMI not displaced or sustained,S1 and S2 within normal limits, no S3, no clicks, no rubs, no murmurs, irregular.   ABD:  Flat, positive bowel sounds normal in frequency in pitch, no bruits, no rebound, no guarding, no midline pulsatile mass, no hepatomegaly, no splenomegaly EXT:  2 plus pulses throughout, no edema, no cyanosis no clubbing SKIN:  No rashes no nodules NEURO:  Cranial nerves II through XII grossly intact, motor grossly intact throughout PSYCH:  Cognitively intact, oriented to person place and time   Labs: Lab Results  Component Value Date   BUN 31 (H) 08/18/2016   Lab Results  Component Value Date   CREATININE 1.46 (H) 08/18/2016   Lab Results  Component Value Date   NA 139 08/18/2016   K 3.5 08/18/2016   CL 104 08/18/2016   CO2 22 08/18/2016   No results found for: TROPONINI Lab Results  Component Value Date   WBC 8.1 08/18/2016   HGB 15.2 (H) 08/18/2016   HCT 46.0 08/18/2016   MCV 93.9 08/18/2016   PLT 242 08/18/2016     EKG:  Atrial fib with rapid ventricular rate.  Inferolateral ST depression consistent with ischemia.     ASSESSMENT AND PLAN:   ATRIAL FIB WITH RAPID RATE:  [name] has a CHA2DS2 - VASc score of 4.  She tolerates  anticoagulation.  Needs improved rate control.  Of note she did not tolerate higher dose Dilt before (sounds like 360 mg).  She felt weak and dizzy with this.  For tonight IV dilt.  Consider increased beta blocker in the AM.    HTN:  BP is elevated today.  Follow with increase in rate control medications. Continue clonidine and losartan.    CKD;   Stage II - III .  Follow creat.     Signed:  Minus Breeding 08/18/2016, 11:57 PM

## 2016-08-18 NOTE — Telephone Encounter (Signed)
I agree if her HR is elevated in the 130's, this may be a-fib and the ER is a good idea so that we can rate-control her.  Dr. Lemmie Evens

## 2016-08-18 NOTE — Telephone Encounter (Signed)
Returned call to patient of Dr. 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

## 2016-08-18 NOTE — ED Notes (Signed)
Dr.Hochrein at bedsisde

## 2016-08-18 NOTE — Telephone Encounter (Signed)
Patient called w/MD recommendations for ED eval. Advised to go to [facility] ED. Called nurse first to notify them of patient's pending arrival.

## 2016-08-18 NOTE — Telephone Encounter (Signed)
Follow up    Pt is calling asking for a call back about if she should go to the hospital or not. Please call.

## 2016-08-18 NOTE — Telephone Encounter (Signed)
New message    Pt c/o BP issue: STAT if pt c/o blurred vision, one-sided weakness or slurred speech  1. What are your last 5 BP readings? 168/108 pulse 130 earlier today 151/103 pulse 132  2. Are you having any other symptoms (ex. Dizziness, headache, blurred vision, passed out)?  No   3. What is your BP issue? Just concerned it is high again

## 2016-08-19 DIAGNOSIS — I4819 Other persistent atrial fibrillation: Secondary | ICD-10-CM | POA: Diagnosis present

## 2016-08-19 DIAGNOSIS — Z880 Allergy status to penicillin: Secondary | ICD-10-CM | POA: Diagnosis not present

## 2016-08-19 DIAGNOSIS — Z853 Personal history of malignant neoplasm of breast: Secondary | ICD-10-CM | POA: Diagnosis not present

## 2016-08-19 DIAGNOSIS — I340 Nonrheumatic mitral (valve) insufficiency: Secondary | ICD-10-CM | POA: Diagnosis present

## 2016-08-19 DIAGNOSIS — H3530 Unspecified macular degeneration: Secondary | ICD-10-CM | POA: Diagnosis present

## 2016-08-19 DIAGNOSIS — M353 Polymyalgia rheumatica: Secondary | ICD-10-CM | POA: Diagnosis present

## 2016-08-19 DIAGNOSIS — M810 Age-related osteoporosis without current pathological fracture: Secondary | ICD-10-CM | POA: Diagnosis present

## 2016-08-19 DIAGNOSIS — I248 Other forms of acute ischemic heart disease: Secondary | ICD-10-CM | POA: Diagnosis present

## 2016-08-19 DIAGNOSIS — Z7901 Long term (current) use of anticoagulants: Secondary | ICD-10-CM | POA: Diagnosis not present

## 2016-08-19 DIAGNOSIS — Z87891 Personal history of nicotine dependence: Secondary | ICD-10-CM | POA: Diagnosis not present

## 2016-08-19 DIAGNOSIS — Z79899 Other long term (current) drug therapy: Secondary | ICD-10-CM | POA: Diagnosis not present

## 2016-08-19 DIAGNOSIS — I4891 Unspecified atrial fibrillation: Secondary | ICD-10-CM | POA: Diagnosis not present

## 2016-08-19 DIAGNOSIS — I129 Hypertensive chronic kidney disease with stage 1 through stage 4 chronic kidney disease, or unspecified chronic kidney disease: Secondary | ICD-10-CM | POA: Diagnosis present

## 2016-08-19 DIAGNOSIS — I480 Paroxysmal atrial fibrillation: Secondary | ICD-10-CM | POA: Diagnosis present

## 2016-08-19 DIAGNOSIS — N183 Chronic kidney disease, stage 3 (moderate): Secondary | ICD-10-CM | POA: Diagnosis present

## 2016-08-19 DIAGNOSIS — M316 Other giant cell arteritis: Secondary | ICD-10-CM | POA: Diagnosis present

## 2016-08-19 DIAGNOSIS — Z85828 Personal history of other malignant neoplasm of skin: Secondary | ICD-10-CM | POA: Diagnosis not present

## 2016-08-19 DIAGNOSIS — I272 Pulmonary hypertension, unspecified: Secondary | ICD-10-CM | POA: Diagnosis present

## 2016-08-19 DIAGNOSIS — Z885 Allergy status to narcotic agent status: Secondary | ICD-10-CM | POA: Diagnosis not present

## 2016-08-19 LAB — TROPONIN I
TROPONIN I: 0.04 ng/mL — AB (ref <0.03)
TROPONIN I: 0.04 ng/mL — AB (ref <0.03)
Troponin I: 0.03 ng/mL (ref <0.03)

## 2016-08-19 LAB — TSH: TSH: 1.735 u[IU]/mL (ref 0.350–4.500)

## 2016-08-19 LAB — CBG MONITORING, ED: Glucose-Capillary: 111 mg/dL — ABNORMAL HIGH (ref 65–99)

## 2016-08-19 MED ORDER — LOSARTAN POTASSIUM 50 MG PO TABS
100.0000 mg | ORAL_TABLET | Freq: Every day | ORAL | Status: DC
  Administered 2016-08-19 – 2016-08-20 (×2): 100 mg via ORAL
  Filled 2016-08-19 (×2): qty 2

## 2016-08-19 MED ORDER — FUROSEMIDE 40 MG PO TABS
40.0000 mg | ORAL_TABLET | Freq: Every day | ORAL | Status: DC
  Administered 2016-08-19 – 2016-08-20 (×2): 40 mg via ORAL
  Filled 2016-08-19: qty 1
  Filled 2016-08-19: qty 2
  Filled 2016-08-19: qty 1

## 2016-08-19 MED ORDER — PROSIGHT PO TABS
1.0000 | ORAL_TABLET | Freq: Every day | ORAL | Status: DC
  Administered 2016-08-20: 1 via ORAL
  Filled 2016-08-19: qty 1

## 2016-08-19 MED ORDER — PRESERVISION AREDS 2 PO CAPS: 1.0000 | ORAL_CAPSULE | Freq: Every day | ORAL | Status: DC

## 2016-08-19 MED ORDER — DILTIAZEM HCL 60 MG PO TABS
60.0000 mg | ORAL_TABLET | Freq: Four times a day (QID) | ORAL | Status: DC
  Administered 2016-08-19 – 2016-08-20 (×3): 60 mg via ORAL
  Filled 2016-08-19 (×4): qty 1

## 2016-08-19 MED ORDER — METOPROLOL SUCCINATE ER 50 MG PO TB24
50.0000 mg | ORAL_TABLET | Freq: Every day | ORAL | Status: DC
  Administered 2016-08-19 – 2016-08-20 (×2): 50 mg via ORAL
  Filled 2016-08-19 (×2): qty 1
  Filled 2016-08-19: qty 2

## 2016-08-19 MED ORDER — DIAZEPAM 2 MG PO TABS
1.0000 mg | ORAL_TABLET | Freq: Every evening | ORAL | Status: DC | PRN
  Administered 2016-08-19: 2 mg via ORAL
  Filled 2016-08-19: qty 1

## 2016-08-19 MED ORDER — POLYETHYLENE GLYCOL 3350 17 G PO PACK: 17.0000 g | PACK | Freq: Every day | ORAL | Status: DC | PRN

## 2016-08-19 MED ORDER — ONDANSETRON HCL 4 MG/2ML IJ SOLN: 4.0000 mg | Freq: Four times a day (QID) | INTRAMUSCULAR | Status: DC | PRN

## 2016-08-19 MED ORDER — MELATONIN 3 MG PO TABS
4.5000 mg | ORAL_TABLET | Freq: Every day | ORAL | Status: DC
Start: 2016-08-19 — End: 2016-08-20
  Administered 2016-08-19: 4.5 mg via ORAL
  Filled 2016-08-19 (×2): qty 1.5

## 2016-08-19 MED ORDER — CLONIDINE HCL 0.3 MG PO TABS
0.3000 mg | ORAL_TABLET | Freq: Three times a day (TID) | ORAL | Status: DC
  Administered 2016-08-19 – 2016-08-20 (×4): 0.3 mg via ORAL
  Filled 2016-08-19 (×4): qty 1

## 2016-08-19 MED ORDER — APIXABAN 2.5 MG PO TABS: 2.5000 mg | ORAL_TABLET | Freq: Two times a day (BID) | ORAL | Status: DC

## 2016-08-19 NOTE — ED Notes (Signed)
Pt taken to RR in wheelchair pt tolerated well.

## 2016-08-19 NOTE — ED Notes (Signed)
Family at bedside. 

## 2016-08-19 NOTE — ED Notes (Signed)
Heart Healthy Diet was ordered for Dinner.

## 2016-08-19 NOTE — ED Notes (Signed)
Pt stood to get from bedside commode to bed; heart rate increased to 144 and decreased to 80s after lying down.

## 2016-08-19 NOTE — ED Notes (Signed)
Pt assisted to bedside commode; pulse increased to 144 and decreased after resting. Afib on monitor. Pt also c/o diaphoresis, skin clammy. Denies CP, dizziness, or SOB.

## 2016-08-19 NOTE — ED Notes (Signed)
Cards at bedside

## 2016-08-19 NOTE — ED Notes (Signed)
Cardiology at bedside.

## 2016-08-19 NOTE — ED Notes (Signed)
Attempted report 

## 2016-08-19 NOTE — Progress Notes (Signed)
Progress Note  Patient Name: Allison Norman Date of Encounter: 08/19/2016  Primary Cardiologist: Hilty  Subjective   Continues to be in afib, rates elevated to 035-597C, BP systolic 163. She is  asymptomatic.  Inpatient Medications    Scheduled Meds: . apixaban  2.5 mg Oral BID  . cloNIDine  0.3 mg Oral TID  . furosemide  40 mg Oral Daily  . losartan  100 mg Oral Daily  . Melatonin  4.5 mg Oral QHS  . metoprolol succinate  50 mg Oral Daily  . PRESERVISION AREDS 2  1 capsule Oral Daily   Continuous Infusions: . diltiazem (CARDIZEM) infusion 10 mg/hr (08/19/16 0723)   PRN Meds: diazepam, ondansetron (ZOFRAN) IV, polyethylene glycol   Vital Signs    Vitals:   08/19/16 0600 08/19/16 0700 08/19/16 0800 08/19/16 0900  BP: (!) 175/90  (!) 179/81 (!) 185/82  Pulse: 94 92 93 93  Resp: (!) 22 15 (!) 23 15  Temp:      TempSrc:      SpO2: 95% 97% 93% 93%  Weight:      Height:        Intake/Output Summary (Last 24 hours) at 08/19/16 1100 Last data filed at 08/19/16 0630  Gross per 24 hour  Intake              500 ml  Output              200 ml  Net              300 ml   Filed Weights   08/18/16 1537  Weight: 140 lb (63.5 kg)    Telemetry    Atrial fibrillation with RVR, rates to 130s- Personally Reviewed   Physical Exam   GEN: Well nourished, well developed HEENT: normal  Neck: no JVD, carotid bruits, or masses Cardiac: irregularly irregular. no murmurs, rubs, or gallops,no edema. Intact distal pulses bilaterally.  Respiratory: clear to auscultation bilaterally, normal work of breathing GI: soft, nontender, nondistended, + BS MS: no deformity or atrophy  Skin: warm and dry, no rash Neuro: Alert and Oriented x 3, Strength and sensation are intact Psych:   Full affect  Labs    Chemistry Recent Labs Lab 08/18/16 2115  NA 139  K 3.5  CL 104  CO2 22  GLUCOSE 132*  BUN 31*  CREATININE 1.46*  CALCIUM 9.2  GFRNONAA 30*  GFRAA 35*  ANIONGAP 13       Hematology Recent Labs Lab 08/18/16 2115  WBC 8.1  RBC 4.90  HGB 15.2*  HCT 46.0  MCV 93.9  MCH 31.0  MCHC 33.0  RDW 14.2  PLT 242    Cardiac Enzymes Recent Labs Lab 08/19/16 0203 08/19/16 0730  TROPONINI 0.04* 0.03*    Recent Labs Lab 08/18/16 2125  TROPIPOC 0.04    Radiology    No results found.  Cardiac Studies   Echocardiogram 09/13/2015 Study Conclusions  - Left ventricle: The cavity size was normal. Wall thickness was   normal. Systolic function was normal. The estimated ejection   fraction was in the range of 60% to 65%. Wall motion was normal;   there were no regional wall motion abnormalities. Indeterminant   diastolic function (atrial fibrillation). - Aortic valve: Trileaflet; moderately calcified leaflets. There   was no stenosis. - Mitral valve: Moderately calcified annulus. There was mild   regurgitation. - Left atrium: The atrium was moderately dilated. - Right ventricle: The cavity size was normal.  Systolic function   was normal. - Right atrium: The atrium was moderately dilated. - Tricuspid valve: Peak RV-RA gradient (S): 34 mm Hg. - Pulmonary arteries: PA peak pressure: 42 mm Hg (S). - Systemic veins: IVC measured 2.6 cm with > 50% respirophasic   variation, suggesting RA pressure 8 mmHg.  Impressions:  - The patient was in atrial fibrillation. Normal LV size with EF   60-65%. Normal RV size and systolic function. Mild mitral   regurgitation. Moderate biatrial enlargement. Mild pulmonary   hypertension.   Patient Profile     83 y.o. female The patient has a history of atrial fib.  She has been managed by Dr. Debara Pickett and had been on Eliquis.  She had cardioversion. She was in sinus at the last visit in July.  However, for the past couple of days she noted that her heart rate was elevated when she would take her BP.  She does not feel this. The patient denies any new symptoms such as chest discomfort, neck or arm discomfort.  There has been no new shortness of breath, PND or orthopnea. There have been no reported palpitations, presyncope or syncope.  She has otherwise felt well.   Assessment & Plan     1. Atrial Fibrillation with RVR: Mildly elevated Trio 0.04-0.03, consistent with demand ischemia. She is controlling her rates overall on the Diltiazem drip. Per chart review it is noted that she did not tolerate higher doses of Diltiazem int he past. She has been compliant with her anticoagulation and could potentially be a DCCV candidate. When rate/rhythm allows I would like to convert IV dilt to PO. --  CHA2DS2 - VASc score of 4 --  Cont Eliquis 2.5 mg BID --  Mg, K  and TSH wnl -- Dilt drip currently at a rate of 10mg , I have asked the nurse increase to 15 mg  2. HTN:  Blood pressure is poorly controlled on current regimen. Systolic 657-846'N -- Losartan 100mg  daily -- Clonidine 0.3 mg TID -- She is on Metoprolol XL 50mg  daily. Lets change this to either Coreg  or Metoprolol XL100 mg for better rate/BP control. Will discuss with attending and continue to follow-up BP closely.  3. CKD II-II: Creatinine is close to her baseline but continue to follow closely (1.46)   Signed, Nashia Remus G, PA-C  08/19/2016, 11:00 AM

## 2016-08-20 ENCOUNTER — Encounter (HOSPITAL_COMMUNITY): Payer: Self-pay | Admitting: General Practice

## 2016-08-20 MED ORDER — METOPROLOL SUCCINATE ER 50 MG PO TB24: 50.0000 mg | ORAL_TABLET | Freq: Two times a day (BID) | ORAL | Status: DC

## 2016-08-20 MED ORDER — DILTIAZEM HCL ER COATED BEADS 180 MG PO CP24: 180.0000 mg | ORAL_CAPSULE | Freq: Every day | ORAL | Status: DC

## 2016-08-20 MED ORDER — CLONIDINE HCL 0.2 MG PO TABS: 0.2000 mg | ORAL_TABLET | Freq: Three times a day (TID) | ORAL | Status: DC

## 2016-08-20 MED ORDER — CLONIDINE HCL 0.2 MG PO TABS: 0.2000 mg | ORAL_TABLET | Freq: Three times a day (TID) | ORAL | 5 refills | Status: DC

## 2016-08-20 MED ORDER — DILTIAZEM HCL ER COATED BEADS 180 MG PO CP24: 180.0000 mg | ORAL_CAPSULE | Freq: Every day | ORAL | 5 refills | Status: DC

## 2016-08-20 MED ORDER — METOPROLOL SUCCINATE ER 50 MG PO TB24: 50.0000 mg | ORAL_TABLET | Freq: Two times a day (BID) | ORAL | 5 refills | Status: DC

## 2016-08-20 NOTE — Progress Notes (Signed)
Progress Note  Patient Name: Allison Norman Date of Encounter: 08/20/2016  Primary Cardiologist: Dr. Debara Pickett  Subjective   SBP dipping into the 80's this morning, now improved to 104. Was dizzy with this but now improved. No chest pain or dyspnea. Notes palpitations   Inpatient Medications    Scheduled Meds: . apixaban  2.5 mg Oral BID  . cloNIDine  0.3 mg Oral TID  . diltiazem  60 mg Oral Q6H  . furosemide  40 mg Oral Daily  . losartan  100 mg Oral Daily  . Melatonin  4.5 mg Oral QHS  . metoprolol succinate  50 mg Oral Daily  . multivitamin  1 tablet Oral Daily   Continuous Infusions:  PRN Meds: diazepam, ondansetron (ZOFRAN) IV, polyethylene glycol   Vital Signs    Vitals:   08/20/16 0200 08/20/16 0520 08/20/16 0522 08/20/16 0905  BP: 103/70 128/75  (!) 108/56  Pulse: 72 69  86  Resp: 18 18    Temp: (!) 97.3 F (36.3 C) 97.8 F (36.6 C)    TempSrc: Oral Oral    SpO2: 93% 92%    Weight:   144 lb 3.2 oz (65.4 kg)   Height:        Intake/Output Summary (Last 24 hours) at 08/20/16 1200 Last data filed at 08/20/16 0900  Gross per 24 hour  Intake              240 ml  Output              450 ml  Net             -210 ml   Filed Weights   08/18/16 1537 08/20/16 0522  Weight: 140 lb (63.5 kg) 144 lb 3.2 oz (65.4 kg)    Telemetry    Atrial fibrillation, mostly rate-controlled in the 70's - 80's. Peaking into the 130's with activity. - Personally Reviewed  ECG    No new tracings.   Physical Exam   General: Well developed, well nourished Caucasian female appearing in no acute distress. Head: Normocephalic, atraumatic.  Neck: Supple without bruits, JVD not elevated. Lungs:  Resp regular and unlabored, CTA without wheezing or rales. Heart: Irregularly irregular, S1, S2, no S3, S4, or murmur; no rub. Abdomen: Soft, non-tender, non-distended with normoactive bowel sounds. No hepatomegaly. No rebound/guarding. No obvious abdominal masses. Extremities: No  clubbing, cyanosis, or lower extremity edema. Distal pedal pulses are 2+ bilaterally. Neuro: Alert and oriented X 3. Moves all extremities spontaneously. Psych: Normal affect.  Labs    Chemistry Recent Labs Lab 08/18/16 2115  NA 139  K 3.5  CL 104  CO2 22  GLUCOSE 132*  BUN 31*  CREATININE 1.46*  CALCIUM 9.2  GFRNONAA 30*  GFRAA 35*  ANIONGAP 13     Hematology Recent Labs Lab 08/18/16 2115  WBC 8.1  RBC 4.90  HGB 15.2*  HCT 46.0  MCV 93.9  MCH 31.0  MCHC 33.0  RDW 14.2  PLT 242    Cardiac Enzymes Recent Labs Lab 08/19/16 0203 08/19/16 0730 08/19/16 1852  TROPONINI 0.04* 0.03* 0.04*    Recent Labs Lab 08/18/16 2125  TROPIPOC 0.04     BNPNo results for input(s): BNP, PROBNP in the last 168 hours.   DDimer No results for input(s): DDIMER in the last 168 hours.   Radiology    No results found.  Cardiac Studies   Echocardiogram: 09/13/2015 Study Conclusions  - Left ventricle: The cavity size was  normal. Wall thickness was   normal. Systolic function was normal. The estimated ejection   fraction was in the range of 60% to 65%. Wall motion was normal;   there were no regional wall motion abnormalities. Indeterminant   diastolic function (atrial fibrillation). - Aortic valve: Trileaflet; moderately calcified leaflets. There   was no stenosis. - Mitral valve: Moderately calcified annulus. There was mild   regurgitation. - Left atrium: The atrium was moderately dilated. - Right ventricle: The cavity size was normal. Systolic function   was normal. - Right atrium: The atrium was moderately dilated. - Tricuspid valve: Peak RV-RA gradient (S): 34 mm Hg. - Pulmonary arteries: PA peak pressure: 42 mm Hg (S). - Systemic veins: IVC measured 2.6 cm with > 50% respirophasic   variation, suggesting RA pressure 8 mmHg.  Impressions:  - The patient was in atrial fibrillation. Normal LV size with EF   60-65%. Normal RV size and systolic function. Mild  mitral   regurgitation. Moderate biatrial enlargement. Mild pulmonary   hypertension.  Patient Profile     83 y.o. female with PMH of PAF, HTN and macular degeneration who presented to [facility] ED on 08/18/2016 for palpitations and dyspnea found to be in atrial fibrillation with RVR.   Assessment & Plan    1. Atrial Fibrillation with RVR - cyclic troponin values have been flat at 0.03 - 0.04, consistent with demand ischemia.  - initially on IV Cardizem but this has been switched to short-acting PO Cardizem 60mg  Q6H. Switch back to PTA Cardizem 240mg  daily (reports being unable to tolerate higher doses of this in the past). Was previously on Toprol-XL 50mg  BID but this was reduced at a prior office visit. Would titrate from 50mg  daily to 50mg  BID to assist with rate-control as HR is in the 140's with ambulation but well-controlled at rest.  - continue Eliquis 2.5mg  BID for anticoagulation.   2. HTN - BP was elevated at 185/116 while in the ED, but hypotensive with SBP in the 80's today.  - on Clonidine 0.3mg  TID, Losartan 100mg  daily, Toprol-XL 50mg  daily, and Cardizem CD 240mg  daily as an outpatient. BP has been labile this admission. Will reduce Clonidine to 0.2mg  TID (reports only taking a 1/2 tablet at home if BP is soft).   3. Stage 3 CKD - baseline creatinine 1.3 - 1.4. At 1.46 on admission.   Signed, Erma Heritage , PA-C 12:00 PM 08/20/2016 Pager: [phone]

## 2016-08-20 NOTE — Progress Notes (Signed)
The patient's blood pressure is 84/56, heart rate in the ~60 to 70's while resting, greater than 120's with exertion, and A-fib on monitor. She is feeling weak and sleeply but responding appropriately. PA Stryder notified. I will continue to monitor the patient closely.   Saddie Benders RN

## 2016-08-20 NOTE — Progress Notes (Signed)
The patient has been given discharge instructions along with a new medication list and what to take today. His has prescriptions to pick up and follow up appointments. Discharging with family via car.   Saddie Benders RN

## 2016-08-20 NOTE — Discharge Summary (Signed)
Discharge Summary    Patient ID: Allison Norman,  MRN: [id number], DOB/AGE: 02/11/1924 83 y.o.  Admit date: 08/18/2016 Discharge date: 08/20/2016  Primary Care Provider: [name] Primary Cardiologist: Dr. Debara Pickett  Discharge Diagnoses    Principal Problem:   Atrial fibrillation with RVR [facility]) Active Problems:   Essential hypertension   CKD (chronic kidney disease) stage 3, GFR 30-59 ml/min   History of Present Illness     Allison Norman is a 83 y.o. female with past medical history of PAF (on Eliquis), HTN, and macular degeneration who presented to [facility] ED on 08/18/2016 for worsening dyspnea and elevated HR.   She was found to be in atrial fibrillation with RVR, HR in the 130's. TSH and electrolytes were found to be within normal limits. She was started on IV Cardizem with improvement in her HR and Eliquis was continued for anticoagulation. Was admitted for further management of her atrial fibrillation and elevated BP.   Hospital Course     Consultants: None  The following morning, her HR's were variable in the 80's to 130's. IV Cardizem was discontinued and she was switched to short-acting PO Cardizem. Was continued on Toprol-XL, Losartan, and Clonidine.   On 08/20/2016, her BP was initially low with SBP in the 80's after receiving her AM medications but she later reported not taking her Clonidine as prescribed as she usually cuts the tablet in half. She was advised against varying her dosing of the Clonidine as this can cause rebound HTN. Clonidine was reduced from 0.3mg  TID to 0.2mg  TID. HR was improved into the 70's - 80's but was still increasing into the 130's with activity, therefore Toprol-XL was increased to 50mg  BID and Cardizem CD was reduced to 180mg  daily. She was last examined by Dr. Debara Pickett and deemed stable for discharge. Two-week Cardiology follow-up has been arranged. She was discharged home in good condition.   _____________  Discharge  Vitals Blood pressure (!) 84/56, pulse 86, temperature 97.8 F (36.6 C), temperature source Oral, resp. rate 18, height 5\' 6"  (1.676 m), weight 144 lb 3.2 oz (65.4 kg), SpO2 92 %.  Filed Weights   08/18/16 1537 08/20/16 0522  Weight: 140 lb (63.5 kg) 144 lb 3.2 oz (65.4 kg)    Labs & Radiologic Studies     CBC  Recent Labs  08/18/16 2115  WBC 8.1  HGB 15.2*  HCT 46.0  MCV 93.9  PLT 536   Basic Metabolic Panel  Recent Labs  08/18/16 2115  NA 139  K 3.5  CL 104  CO2 22  GLUCOSE 132*  BUN 31*  CREATININE 1.46*  CALCIUM 9.2  MG 2.2   Liver Function Tests No results for input(s): AST, ALT, ALKPHOS, BILITOT, PROT, ALBUMIN in the last 72 hours. No results for input(s): LIPASE, AMYLASE in the last 72 hours. Cardiac Enzymes  Recent Labs  08/19/16 0203 08/19/16 0730 08/19/16 1852  TROPONINI 0.04* 0.03* 0.04*   BNP Invalid input(s): POCBNP D-Dimer No results for input(s): DDIMER in the last 72 hours. Hemoglobin A1C No results for input(s): HGBA1C in the last 72 hours. Fasting Lipid Panel No results for input(s): CHOL, HDL, LDLCALC, TRIG, CHOLHDL, LDLDIRECT in the last 72 hours. Thyroid Function Tests  Recent Labs  08/19/16 0203  TSH 1.735    No results found.   Diagnostic Studies/Procedures    None Performed.   Disposition   Pt is being discharged home today in good condition.  Follow-up Plans &  Appointments    Follow-up [place], [place], PA-C Follow up on 09/01/2016.   Specialties:  Physician Assistant, Cardiology Why:  Cardiology Hospital Follow-up on 09/01/2016 at 11:00AM.  Contact information: [address] [place] [address] [phone]          Discharge Instructions    Diet - low sodium heart healthy    Complete by:  As directed       Discharge Medications     Medication List    TAKE these medications   cloNIDine 0.2 MG tablet Commonly known as:  CATAPRES Take 1 tablet (0.2 mg total)  by mouth 3 (three) times daily. Per PCP What changed:  medication strength  how much to take   diazepam 2 MG tablet Commonly known as:  VALIUM Take 1-2 mg by mouth at bedtime as needed (sleep).   diltiazem 180 MG 24 hr capsule Commonly known as:  CARDIZEM CD Take 1 capsule (180 mg total) by mouth daily. What changed:  medication strength  how much to take   ELIQUIS 2.5 MG Tabs tablet Generic drug:  apixaban TAKE 1 TABLET BY MOUTH TWICE DAILY What changed:  See the new instructions.   furosemide 80 MG tablet Commonly known as:  LASIX Take 0.5 tablets (40 mg total) by mouth daily. Per PCP   losartan 100 MG tablet Commonly known as:  COZAAR Take 100 mg by mouth daily. Per PCP   Melatonin 5 MG Tabs Take 5 mg by mouth at bedtime.   metoprolol succinate 50 MG 24 hr tablet Commonly known as:  TOPROL-XL Take 1 tablet (50 mg total) by mouth 2 (two) times daily. Per PCP What changed:  when to take this   polyethylene glycol packet Commonly known as:  MIRALAX / GLYCOLAX Take 17 g by mouth daily as needed (constipation). Mix in 8 oz liquid and drink   PRESERVISION AREDS 2 Caps Take 1 capsule by mouth daily.        Allergies Allergies  Allergen Reactions  . Codeine Itching  . Penicillins Itching and Rash    Has patient had a PCN reaction causing immediate rash, facial/tongue/throat swelling, SOB or lightheadedness with hypotension: Yes Has patient had a PCN reaction causing severe rash involving mucus membranes or skin necrosis: No Has patient had a PCN reaction that required hospitalization: No Has patient had a PCN reaction occurring within the last 10 years: No If all of the above answers are "NO", then may proceed with Cephalosporin use.     Outstanding Labs/Studies   None  Duration of Discharge Encounter   Greater than 30 minutes including physician time.  Signed, Erma Heritage, PA-C 08/20/2016, 3:50 PM

## 2016-08-20 NOTE — Consult Note (Signed)
           [facility] CM Primary Care Navigator  08/20/2016  Allison Norman 10-29-1924 [id number]   Went to seepatient at the bedsideto identify possible discharge needs but she was already discharged per staff report.  Patient was discharged home today.  Primary care provider's officeis listed as doing transition of care.    For questions, please contact:  Dannielle Huh, BSN, RN- [facility] Primary Care Navigator  Telephone: [phone] [place]

## 2016-08-31 NOTE — Progress Notes (Signed)
Cardiology Office Note    Date:  09/01/2016   ID:  Allison, Norman 08-04-1924, MRN [id number]  PCP:  [name], MD  Cardiologist: Dr. Debara Pickett   Chief Complaint  Patient presents with  . Hospitalization Follow-up    History of Present Illness:    Allison Norman is a 83 y.o. female with past medical history of PAF (s/p DCCV in 10/2015, on Eliquis), HTN, and macular degeneration who presents to the office today for hospital follow-up.   She was recently admitted to [facility] from 8/13 - 08/20/2016 for evaluation of dyspnea and elevated HR, found to be in atrial fibrillation with RVR. TSH and electrolytes were within normal limits with cyclic troponin values being flat at 0.03 - 0.04. She was initially placed on IV Cardizem but this was switched to Cardizem CD 180mg  daily (on 240mg  daily prior to admission). Her BP was variable but it was discovered she only took her Clonidine as needed instead of scheduled. Clonidine was therefore reduced from 0.3mg  TID to 0.2mg  TID and Toprol-XL was increased to 50mg  BID.   In talking with the patient today, she reports overall doing well since her recent hospitalization. She is under increased stress as she is the primary caregiver for her husband who has dementia. She denies any palpitations, chest discomfort, or dyspnea on exertion when carrying out her daily activities. She does notice her heart skipping beats at nighttime when she is lying in bed and preparing to go to sleep.  She has been keeping a close check on her blood pressure at home and has noticed variable readings. Overall her BP has been in the 130's-160's /70's - 80's. She reports good compliance with her medication regimen and denies missing any recent doses.   She has continued on Eliquis for anticoagulation and denies any evidence of active bleeding with this.  Past Medical History:  Diagnosis Date  . Atrial fibrillation ([place]) 2017   a. s/p DCCV in 10/2015  b. recurrent  in 08/2016 --> rate-control pursued.   . Cancer ([place])    Breast  . CKD (chronic kidney disease)   . GCA (giant cell arteritis) ([place])   . Hypertension   . Macular degeneration   . Osteoporosis   . PMR (polymyalgia rheumatica) ([facility])   . Skin cancer     Past Surgical History:  Procedure Laterality Date  . CARDIOVERSION N/A 10/18/2015   Procedure: CARDIOVERSION;  Surgeon: Jerline Pain, MD;  Location: [place];  Service: Cardiovascular;  Laterality: N/A;  . KNEE SURGERY  2013  . MASTECTOMY  1998   left side    Current Medications: Outpatient Medications Prior to Visit  Medication Sig Dispense Refill  . cloNIDine (CATAPRES) 0.2 MG tablet Take 1 tablet (0.2 mg total) by mouth 3 (three) times daily. Per PCP 90 tablet 5  . diazepam (VALIUM) 2 MG tablet Take 1-2 mg by mouth at bedtime as needed (sleep).   0  . ELIQUIS 2.5 MG TABS tablet TAKE 1 TABLET BY MOUTH TWICE DAILY (Patient taking differently: TAKE 1 TABLET (2.5 MG) BY MOUTH TWICE DAILY) 180 tablet 0  . furosemide (LASIX) 80 MG tablet Take 0.5 tablets (40 mg total) by mouth daily. Per PCP    . losartan (COZAAR) 100 MG tablet Take 100 mg by mouth daily. Per PCP     . Melatonin 5 MG TABS Take 5 mg by mouth at bedtime.    . metoprolol succinate (TOPROL-XL) 50 MG 24 hr  tablet Take 1 tablet (50 mg total) by mouth 2 (two) times daily. Per PCP 60 tablet 5  . Multiple Vitamins-Minerals (PRESERVISION AREDS 2) CAPS Take 1 capsule by mouth daily.    . polyethylene glycol (MIRALAX / GLYCOLAX) packet Take 17 g by mouth daily as needed (constipation). Mix in 8 oz liquid and drink    . diltiazem (CARDIZEM CD) 180 MG 24 hr capsule Take 1 capsule (180 mg total) by mouth daily. 90 capsule 5   No facility-administered medications prior to visit.      Allergies:   Codeine and Penicillins   Social History   Social History  . Marital status: Married    Spouse name: N/A  . Number of children: 2  . Years of education: N/A   Social History  Main Topics  . Smoking status: Former Smoker    Quit date: 08/31/1966  . Smokeless tobacco: Never Used  . Alcohol use No  . Drug use: No  . Sexual activity: Not Asked   Other Topics Concern  . None   Social History Narrative   epworth sleepiness scale = 8 (08/31/15)     Family History:  The patient's family history includes Breast cancer in her daughter; Heart disease in her sister and sister; Kidney failure in her father; Stroke in her mother.   Review of Systems:   Please see the history of present illness.     General:  No chills, fever, night sweats or weight changes.  Cardiovascular:  No chest pain, dyspnea on exertion, edema, orthopnea, paroxysmal nocturnal dyspnea. Positive for palpitations.  Dermatological: No rash, lesions/masses Respiratory: No cough, dyspnea Urologic: No hematuria, dysuria Abdominal:   No nausea, vomiting, diarrhea, bright red blood per rectum, melena, or hematemesis Neurologic:  No visual changes, wkns, changes in mental status. All other systems reviewed and are otherwise negative except as noted above.   Physical Exam:    VS:  BP (!) 146/70   Pulse 74   Ht 5\' 6"  (1.676 m)   Wt 148 lb (67.1 kg)   BMI 23.89 kg/m    General: Well developed, well nourished, elderly Caucasian female appearing in no acute distress. Head: Normocephalic, atraumatic, sclera non-icteric, no xanthomas, nares are without discharge.  Neck: No carotid bruits. JVD not elevated.  Lungs: Respirations regular and unlabored, without wheezes or rales.  Heart: Irregularly irregular. No S3 or S4.  No murmur, no rubs, or gallops appreciated. Abdomen: Soft, non-tender, non-distended with normoactive bowel sounds. No hepatomegaly. No rebound/guarding. No obvious abdominal masses. Msk:  Strength and tone appear normal for age. No joint deformities or effusions. Extremities: No clubbing or cyanosis. No lower extremity edema.  Distal pedal pulses are 2+ bilaterally. Neuro: Alert and  oriented X 3. Moves all extremities spontaneously. No focal deficits noted. Psych:  Responds to questions appropriately with a normal affect. Skin: No rashes or lesions noted  Wt Readings from Last 3 Encounters:  09/01/16 148 lb (67.1 kg)  08/20/16 144 lb 3.2 oz (65.4 kg)  07/10/16 150 lb 12.8 oz (68.4 kg)     Studies/Labs Reviewed:   EKG:  EKG is not ordered today.   Recent Labs: 08/18/2016: BUN 31; Creatinine, Ser 1.46; Hemoglobin 15.2; Magnesium 2.2; Platelets 242; Potassium 3.5; Sodium 139 08/19/2016: TSH 1.735   Lipid Panel    Component Value Date/Time   CHOL  07/04/2009 0418    81        ATP III CLASSIFICATION:  <200     mg/dL  Desirable  200-239  mg/dL   Borderline High  >=240    mg/dL   High          TRIG 64 07/04/2009 0418   HDL 18 (L) 07/04/2009 0418   CHOLHDL 4.5 07/04/2009 0418   VLDL 13 07/04/2009 0418   LDLCALC  07/04/2009 0418    50        Total Cholesterol/HDL:CHD Risk Coronary Heart Disease Risk Table                     Men   Women  1/2 Average Risk   3.4   3.3  Average Risk       5.0   4.4  2 X Average Risk   9.6   7.1  3 X Average Risk  23.4   11.0        Use the calculated Patient Ratio above and the CHD Risk Table to determine the patient's CHD Risk.        ATP III CLASSIFICATION (LDL):  <100     mg/dL   Optimal  100-129  mg/dL   Near or Above                    Optimal  130-159  mg/dL   Borderline  160-189  mg/dL   High  >190     mg/dL   Very High    Additional studies/ records that were reviewed today include:   Echocardiogram: 09/2015 Study Conclusions  - Left ventricle: The cavity size was normal. Wall thickness was   normal. Systolic function was normal. The estimated ejection   fraction was in the range of 60% to 65%. Wall motion was normal;   there were no regional wall motion abnormalities. Indeterminant   diastolic function (atrial fibrillation). - Aortic valve: Trileaflet; moderately calcified leaflets. There   was no  stenosis. - Mitral valve: Moderately calcified annulus. There was mild   regurgitation. - Left atrium: The atrium was moderately dilated. - Right ventricle: The cavity size was normal. Systolic function   was normal. - Right atrium: The atrium was moderately dilated. - Tricuspid valve: Peak RV-RA gradient (S): 34 mm Hg. - Pulmonary arteries: PA peak pressure: 42 mm Hg (S). - Systemic veins: IVC measured 2.6 cm with > 50% respirophasic   variation, suggesting RA pressure 8 mmHg.  Impressions:  - The patient was in atrial fibrillation. Normal LV size with EF   60-65%. Normal RV size and systolic function. Mild mitral   regurgitation. Moderate biatrial enlargement. Mild pulmonary   hypertension.   Assessment:    1. PAF (paroxysmal atrial fibrillation) ([place])   2. On anticoagulant therapy   3. Essential hypertension   4. CKD (chronic kidney disease) stage 3, GFR 30-59 ml/min      Plan:   In order of problems listed above:  1. Paroxysmal Atrial Fibrillation/ Long-term Anticoagulation - recently admitted from 8/13 - 08/20/2016 for dyspnea and elevated HR, found to be in atrial fibrillation with RVR. Had undergone DCCV in 10/2015 and with her being mostly asymptomatic once HR improved, a rhythm control strategy was pursued. TSH and electrolytes were within normal limits with cyclic troponin values being flat at 0.03 - 0.04.  - she denies any dyspnea on exertion or episodes of tachycardia. Does note occasional palpitations at night. HR is well-controlled in the 70's during today's visit.  - continue Toprol-XL 50mg  BID. Will increase Cardizem CD from 180mg  daily to 240mg  daily  to assist with BP control (was on this dosing prior to hospital admission).  - This patients CHA2DS2-VASc Score and unadjusted Ischemic Stroke Rate (% per year) is equal to 4.8 % stroke rate/year from a score of 4 (HTN, Female, Age (2)). She denies any evidence of active bleeding. Continue with Eliquis 2.5mg  BID  (reduced dosing secondary to age and kidney function).    2. HTN - BP is slightly elevated at 146/70 during today's visit. Reports BP has been in the 130's-160's /70's - 80's when checked at home.  - continue Clonidine 0.2mg  TID, Losartan 100mg  daily, Toprol-XL 50mg  BID and Cardizem CD (with dose adjustment as above to 240mg  daily).   3. Stage 3 CKD - baseline creatinine 1.4 - 1.6. Stable at 1.46 during recent hospital admission.    Medication Adjustments/Labs and Tests Ordered: Current medicines are reviewed at length with the patient today.  Concerns regarding medicines are outlined above.  Medication changes, Labs and Tests ordered today are listed in the Patient Instructions below. Patient Instructions  Medication Instructions:  INCREASE Cardizem to 240 mg daily  Follow-Up: Your physician recommends that you schedule a follow-up appointment in: 3 MONTHS with Dr. Debara Pickett.    Any Other Special Instructions Will Be Listed Below (If Applicable).  If you need a refill on your cardiac medications before your next appointment, please call your pharmacy.    Signed, Erma Heritage, PA-C  09/01/2016 2:43 PM    [place], [place], [place]  [address] Phone: [phone]; Fax: [phone]  [address], [place], [place] [address] Phone: [phone]

## 2016-09-01 ENCOUNTER — Encounter: Payer: Self-pay | Admitting: Student

## 2016-09-01 ENCOUNTER — Ambulatory Visit (INDEPENDENT_AMBULATORY_CARE_PROVIDER_SITE_OTHER): Payer: Medicare Other | Admitting: Student

## 2016-09-01 VITALS — BP 146/70 | HR 74 | Ht 66.0 in | Wt 148.0 lb

## 2016-09-01 DIAGNOSIS — I10 Essential (primary) hypertension: Secondary | ICD-10-CM | POA: Diagnosis not present

## 2016-09-01 DIAGNOSIS — Z7901 Long term (current) use of anticoagulants: Secondary | ICD-10-CM | POA: Diagnosis not present

## 2016-09-01 DIAGNOSIS — I480 Paroxysmal atrial fibrillation: Secondary | ICD-10-CM | POA: Diagnosis not present

## 2016-09-01 DIAGNOSIS — N183 Chronic kidney disease, stage 3 unspecified: Secondary | ICD-10-CM

## 2016-09-01 MED ORDER — DILTIAZEM HCL ER COATED BEADS 240 MG PO CP24: 240.0000 mg | ORAL_CAPSULE | Freq: Every day | ORAL | 3 refills | Status: DC

## 2016-09-01 NOTE — Patient Instructions (Signed)
Medication Instructions:  INCREASE Cardizem to 240 mg daily  Follow-Up: Your physician recommends that you schedule a follow-up appointment in: 3 MONTHS with Dr. Debara Pickett.    Any Other Special Instructions Will Be Listed Below (If Applicable).     If you need a refill on your cardiac medications before your next appointment, please call your pharmacy.

## 2016-09-22 DIAGNOSIS — H353222 Exudative age-related macular degeneration, left eye, with inactive choroidal neovascularization: Secondary | ICD-10-CM | POA: Diagnosis not present

## 2016-09-22 DIAGNOSIS — H353122 Nonexudative age-related macular degeneration, left eye, intermediate dry stage: Secondary | ICD-10-CM | POA: Diagnosis not present

## 2016-09-22 DIAGNOSIS — H353112 Nonexudative age-related macular degeneration, right eye, intermediate dry stage: Secondary | ICD-10-CM | POA: Diagnosis not present

## 2016-09-22 DIAGNOSIS — H353211 Exudative age-related macular degeneration, right eye, with active choroidal neovascularization: Secondary | ICD-10-CM | POA: Diagnosis not present

## 2016-09-25 ENCOUNTER — Telehealth: Payer: Self-pay | Admitting: Internal Medicine

## 2016-09-25 NOTE — Telephone Encounter (Signed)
Followed up w patient, she is aware of recommendations. She will call back next week w readings and understanding that we may need to adjust her medications if BP readings warrant. She voiced understanding and thanks.

## 2016-09-25 NOTE — Telephone Encounter (Signed)
°  New Message   pt verbalized that she is calling for rn   She said she had a funny spell yesterday, pt wouldn't go into detail when asked   She advised she spoke to the rn about it 2 to 3 weeks ago

## 2016-09-25 NOTE — Telephone Encounter (Signed)
Agree to collect more BP measurements - may need to decrease clonidine.  Dr. Lemmie Evens

## 2016-09-25 NOTE — Telephone Encounter (Signed)
Returned call to patient.  She recently saw [place] for hosp f/u  Pt reports "strange feeling" yesterday.  Notes she takes clonidine 0.2mg  3 times daily.  States she took her mid-day dose, a short while later went out shopping, reports she felt strange but did not feel like she was going to faint, and returned home. She could not describe her symptoms other than being "really weird". No lightheadedness, dizziness, etc.   She obtained BP 104/57 HR 50 a little while after returning home yesterday.  Notes her last check prior to this was ~1 week ago: 164/78 HR 52  Informed pt I would route to Dr. Debara Pickett for recommendations but for now, advised to go ahead and begin checking BPs two times daily, once in AM, once in PM & to call us w these after a few days. Will communicate further advice from MD w patient.

## 2016-10-28 ENCOUNTER — Telehealth: Payer: Self-pay | Admitting: Internal Medicine

## 2016-10-28 DIAGNOSIS — I4891 Unspecified atrial fibrillation: Secondary | ICD-10-CM

## 2016-10-28 MED ORDER — APIXABAN 2.5 MG PO TABS: 2.5000 mg | ORAL_TABLET | Freq: Two times a day (BID) | ORAL | 5 refills | Status: DC

## 2016-10-28 NOTE — Telephone Encounter (Signed)
NEw Message  Pt c/o medication issue:  1. Name of Medication: Eliquis   2. How are you currently taking this medication (dosage and times per day)? 2.5mg    3. Are you having a reaction (difficulty breathing--STAT)? no  4. What is your medication issue? Per pt can not afford 180 tablets. She would like to get 60 tablets. Please call back to discuss

## 2016-10-28 NOTE — Telephone Encounter (Signed)
Spoke to patient, sent in Rx for #60 w refills, she is aware and verbalized thanks. I have checked for samples in-house, none available.  Pt identified no other concerns at this time.

## 2016-11-03 DIAGNOSIS — H353211 Exudative age-related macular degeneration, right eye, with active choroidal neovascularization: Secondary | ICD-10-CM | POA: Diagnosis not present

## 2016-11-03 DIAGNOSIS — H353122 Nonexudative age-related macular degeneration, left eye, intermediate dry stage: Secondary | ICD-10-CM | POA: Diagnosis not present

## 2016-11-03 DIAGNOSIS — H353112 Nonexudative age-related macular degeneration, right eye, intermediate dry stage: Secondary | ICD-10-CM | POA: Diagnosis not present

## 2016-11-03 DIAGNOSIS — H353222 Exudative age-related macular degeneration, left eye, with inactive choroidal neovascularization: Secondary | ICD-10-CM | POA: Diagnosis not present

## 2016-11-11 ENCOUNTER — Telehealth: Payer: Self-pay | Admitting: Internal Medicine

## 2016-11-11 NOTE — Telephone Encounter (Signed)
Spoke pt all questions answered.

## 2016-11-11 NOTE — Telephone Encounter (Signed)
New Message  Pt call requesting to speak with RN. Pt states she has a cough and would like a good recommendation for cough medicine. Please call back to discuss

## 2016-11-24 ENCOUNTER — Ambulatory Visit (INDEPENDENT_AMBULATORY_CARE_PROVIDER_SITE_OTHER): Payer: Medicare Other | Admitting: Internal Medicine

## 2016-11-24 ENCOUNTER — Encounter: Payer: Self-pay | Admitting: Internal Medicine

## 2016-11-24 VITALS — BP 148/64 | HR 59 | Ht 66.0 in | Wt 150.0 lb

## 2016-11-24 DIAGNOSIS — N183 Chronic kidney disease, stage 3 unspecified: Secondary | ICD-10-CM

## 2016-11-24 DIAGNOSIS — I480 Paroxysmal atrial fibrillation: Secondary | ICD-10-CM | POA: Diagnosis not present

## 2016-11-24 DIAGNOSIS — I10 Essential (primary) hypertension: Secondary | ICD-10-CM

## 2016-11-24 NOTE — Patient Instructions (Signed)
Your physician wants you to follow-up in: 6 months with Dr. Hilty. You will receive a reminder letter in the mail two months in advance. If you don't receive a letter, please call our office to schedule the follow-up appointment.    

## 2016-11-25 ENCOUNTER — Encounter: Payer: Self-pay | Admitting: Internal Medicine

## 2016-11-25 DIAGNOSIS — M353 Polymyalgia rheumatica: Secondary | ICD-10-CM | POA: Diagnosis not present

## 2016-11-25 DIAGNOSIS — F411 Generalized anxiety disorder: Secondary | ICD-10-CM | POA: Diagnosis not present

## 2016-11-25 NOTE — Progress Notes (Signed)
OFFICE NOTE  Chief Complaint:  Follow-up A. fib  Primary Care Physician: [name], MD  HPI:  Allison Norman is a 83 y.o. female who is coming referred to me for new onset atrial fibrillation. Recently Allison Norman had noted that her heart was fluttering somewhat when she laid on her left side. She presented for evaluation of leg swelling and was noted to have an irregular heartbeat. EKG was performed which showed a new onset atrial fibrillation. She has had somewhat elevated ventricular responses from the 90s to 120s. She was a ready on the metoprolol as well as diltiazem and was placed on Eliquis 2.5 mg twice daily. This is based on age greater than 57 and creatinine of 1.57. Allison Norman is been on this dose for 2 days and has had some bleeding problems. She has a history of thin skin and was on steroids for PMR. She had some bleeding over her left forearm which was dressed today. She denies any chest pain or worsening shortness of breath. 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  10/11/2015  Allison Norman returns today for follow-up. She remains in atrial fibrillation with controlled ventricular response. I had increased her diltiazem however this apparently dropped her blood pressure. She was also on losartan. She did not feel well with a low blood pressure and decrease the dose back to her original dose. She's been compliant with her anticoagulation and has been anticoagulated for close to 4 weeks. We discussed elective cardioversion and she is willing to proceed.  12/05/2015  Allison Norman was seen today in follow-up. She had successful cardioversion to sinus rhythm and notes a marked improvement in her energy level. She did not realize how much it was affecting her. She had some confusion as to whether or not she needs to stay on anticoagulation, however I mentioned that should be a lifetime medication for  her. She is maintaining sinus rhythm today at 78.  07/10/2016  Allison Norman returns for follow-up. She is doing well back in sinus rhythm. She is on a was for CHADSVASC score 4. She says she feels markedly better in sinus rhythm and therefore she should be able to monitor for symptoms and recurrence. Blood pressure is at goal today. Initially was elevated however recheck came down to 140/74. She has had some leg edema in the past which is basically resolved.  11/25/2016  Allison Norman returns today for follow-up of her A. fib.  Recently she saw [place], PA-C, who noted she had maintained sinus rhythm however heart rate was elevated and her Cardizem was increased from 180 mg to 240 mg daily for better rate control.  She says she felt worse with this change and thought that her blood pressure was too low, therefore she decreased the dose back to her home dose.  Blood pressure appears to be well controlled today.  PMHx:  Past Medical History:  Diagnosis Date  . Atrial fibrillation ([place]) 2017   a. s/p DCCV in 10/2015  b. recurrent in 08/2016 --> rate-control pursued.   . Cancer ([place])    Breast  . CKD (chronic kidney disease)   . GCA (giant cell arteritis) ([place])   . Hypertension   . Macular degeneration   . Osteoporosis   . PMR (polymyalgia rheumatica) ([facility])   . Skin cancer     Past Surgical History:  Procedure Laterality Date  . CARDIOVERSION N/A  10/18/2015   Procedure: CARDIOVERSION;  Surgeon: Jerline Pain, MD;  Location: [facility];  Service: Cardiovascular;  Laterality: N/A;  . KNEE SURGERY  2013  . MASTECTOMY  1998   left side    FAMHx:  Family History  Problem Relation Age of Onset  . Stroke Mother   . Kidney failure Father        died at 11  . Heart disease Sister        died at 45  . Heart disease Sister        died at 38  . Breast cancer Daughter     SOCHx:   reports that she quit smoking about 50 years ago. she has never used smokeless tobacco. She reports  that she does not drink alcohol or use drugs.  ALLERGIES:  Allergies  Allergen Reactions  . Codeine Itching  . Penicillins Itching and Rash    Has patient had a PCN reaction causing immediate rash, facial/tongue/throat swelling, SOB or lightheadedness with hypotension: Yes Has patient had a PCN reaction causing severe rash involving mucus membranes or skin necrosis: No Has patient had a PCN reaction that required hospitalization: No Has patient had a PCN reaction occurring within the last 10 years: No If all of the above answers are "NO", then may proceed with Cephalosporin use.    ROS: Pertinent items noted in HPI and remainder of comprehensive ROS otherwise negative.  HOME MEDS: Current Outpatient Medications  Medication Sig Dispense Refill  . apixaban (ELIQUIS) 2.5 MG TABS tablet Take 1 tablet (2.5 mg total) by mouth 2 (two) times daily. 60 tablet 5  . CARTIA XT 180 MG 24 hr capsule Take 1 tablet daily by mouth.  0  . cloNIDine (CATAPRES) 0.2 MG tablet Take 1 tablet (0.2 mg total) by mouth 3 (three) times daily. Per PCP 90 tablet 5  . diazepam (VALIUM) 2 MG tablet Take 1-2 mg by mouth at bedtime as needed (sleep).   0  . furosemide (LASIX) 80 MG tablet Take 0.5 tablets (40 mg total) by mouth daily. Per PCP    . losartan (COZAAR) 100 MG tablet Take 100 mg by mouth daily. Per PCP     . Melatonin 5 MG TABS Take 5 mg by mouth at bedtime.    . metoprolol succinate (TOPROL-XL) 50 MG 24 hr tablet Take 1 tablet (50 mg total) by mouth 2 (two) times daily. Per PCP 60 tablet 5  . Multiple Vitamins-Minerals (PRESERVISION AREDS 2) CAPS Take 1 capsule by mouth daily.    . polyethylene glycol (MIRALAX / GLYCOLAX) packet Take 17 g by mouth daily as needed (constipation). Mix in 8 oz liquid and drink    . diltiazem (CARDIZEM CD) 240 MG 24 hr capsule Take 1 capsule (240 mg total) by mouth daily. 90 capsule 3   No current facility-administered medications for this visit.     LABS/IMAGING: No  results found for this or any previous visit (from the past 48 hour(s)). No results found.  WEIGHTS: Wt Readings from Last 3 Encounters:  11/24/16 150 lb (68 kg)  09/01/16 148 lb (67.1 kg)  08/20/16 144 lb 3.2 oz (65.4 kg)    VITALS: BP (!) 148/64   Pulse (!) 59   Ht 5\' 6"  (1.676 m)   Wt 150 lb (68 kg)   BMI 24.21 kg/m   EXAM: General appearance: alert and no distress Neck: no carotid bruit, no JVD and thyroid not enlarged, symmetric, no tenderness/mass/nodules Lungs: clear to auscultation  bilaterally Heart: regular rate and rhythm, S1, S2 normal, no murmur, click, rub or gallop Abdomen: soft, non-tender; bowel sounds normal; no masses,  no organomegaly Extremities: extremities normal, atraumatic, no cyanosis or edema Pulses: 2+ and symmetric Skin: Skin color, texture, turgor normal. No rashes or lesions Neurologic: Grossly normal Psych: Pleasant  EKG: SInus bradycardia at 59-personally reviewed  ASSESSMENT: 1. New onset A. fib-CHADSVASC score of 4 - s/p successful DCCV (10/2015) - on Eliquis 2. Hypertension 3. Leg edema  PLAN: 1.  Mrs. Bensen maintaining sinus bradycardia after successful cardioversion on Eliquis.  She denies any bleeding problems.  Her blood pressure is well controlled.  She is now back on her previous dose of Cardizem.  We will continue her current medications.  Plan to see her back in 6 months or sooner as necessary.  Pixie Casino, MD, [facility] [place] Director of the [facility] Lipid Disorders &  Cardiovascular [facility] Attending Cardiologist  Direct Dial: [phone]  Fax: [phone]  Website:  [email or web address] 11/25/2016, 7:01 PM

## 2016-12-23 DIAGNOSIS — H353222 Exudative age-related macular degeneration, left eye, with inactive choroidal neovascularization: Secondary | ICD-10-CM | POA: Diagnosis not present

## 2016-12-23 DIAGNOSIS — H353122 Nonexudative age-related macular degeneration, left eye, intermediate dry stage: Secondary | ICD-10-CM | POA: Diagnosis not present

## 2016-12-23 DIAGNOSIS — H353211 Exudative age-related macular degeneration, right eye, with active choroidal neovascularization: Secondary | ICD-10-CM | POA: Diagnosis not present

## 2016-12-23 DIAGNOSIS — H353112 Nonexudative age-related macular degeneration, right eye, intermediate dry stage: Secondary | ICD-10-CM | POA: Diagnosis not present

## 2017-01-12 ENCOUNTER — Other Ambulatory Visit: Payer: Self-pay

## 2017-01-12 MED ORDER — METOPROLOL SUCCINATE ER 50 MG PO TB24: 50.0000 mg | ORAL_TABLET | Freq: Every day | ORAL | 10 refills | Status: DC

## 2017-01-12 NOTE — Telephone Encounter (Signed)
Rx(s) sent to pharmacy electronically.  

## 2017-01-13 ENCOUNTER — Telehealth: Payer: Self-pay | Admitting: Internal Medicine

## 2017-01-13 MED ORDER — METOPROLOL SUCCINATE ER 50 MG PO TB24: 50.0000 mg | ORAL_TABLET | Freq: Two times a day (BID) | ORAL | 10 refills | Status: DC

## 2017-01-13 NOTE — Telephone Encounter (Signed)
Rx(s) sent to pharmacy electronically.  

## 2017-01-13 NOTE — Telephone Encounter (Signed)
Rx has been sent to the pharmacy electronically. ° °

## 2017-01-13 NOTE — Telephone Encounter (Signed)
New message   Patient calling to confirm frequency of medication . Patient states she takes 2 tabs a day. Please call  Pt c/o medication issue:  1. Name of Medication: metoprolol succinate (TOPROL-XL) 50 MG 24 hr tablet  2. How are you currently taking this medication (dosage and times per day)? Take 1 tablet (50 mg total) by mouth daily.  3. Are you having a reaction (difficulty breathing--STAT)? No  4. What is your medication issue? Patient states she takes 2 tabs a day, requesting script be corrected

## 2017-01-13 NOTE — Telephone Encounter (Signed)
Pt calling requesting a call back concerning her medication metoprolol. Pt stated that her medication was not sent in right. Pt would like a call back. Please address

## 2017-01-14 ENCOUNTER — Emergency Department (HOSPITAL_COMMUNITY): Payer: Medicare Other

## 2017-01-14 ENCOUNTER — Telehealth: Payer: Self-pay | Admitting: Internal Medicine

## 2017-01-14 ENCOUNTER — Emergency Department (HOSPITAL_COMMUNITY)
Admission: EM | Admit: 2017-01-14 | Discharge: 2017-01-14 | Disposition: A | Discharge status: Home / Self Care | Payer: Medicare Other | Attending: Emergency Medicine | Admitting: Emergency Medicine

## 2017-01-14 ENCOUNTER — Other Ambulatory Visit: Payer: Self-pay

## 2017-01-14 ENCOUNTER — Encounter (HOSPITAL_COMMUNITY): Payer: Self-pay | Admitting: Emergency Medicine

## 2017-01-14 DIAGNOSIS — Z5321 Procedure and treatment not carried out due to patient leaving prior to being seen by health care provider: Secondary | ICD-10-CM | POA: Insufficient documentation

## 2017-01-14 DIAGNOSIS — I700 Atherosclerosis of aorta: Secondary | ICD-10-CM | POA: Diagnosis not present

## 2017-01-14 DIAGNOSIS — R002 Palpitations: Secondary | ICD-10-CM | POA: Diagnosis not present

## 2017-01-14 LAB — CBC
HEMATOCRIT: 44 % (ref 36.0–46.0)
HEMOGLOBIN: 13.7 g/dL (ref 12.0–15.0)
MCH: 30.7 pg (ref 26.0–34.0)
MCHC: 31.1 g/dL (ref 30.0–36.0)
MCV: 98.7 fL (ref 78.0–100.0)
Platelets: 237 10*3/uL (ref 150–400)
RBC: 4.46 MIL/uL (ref 3.87–5.11)
RDW: 15.6 % — AB (ref 11.5–15.5)
WBC: 8.2 10*3/uL (ref 4.0–10.5)

## 2017-01-14 LAB — I-STAT TROPONIN, ED: Troponin i, poc: 0.05 ng/mL (ref 0.00–0.08)

## 2017-01-14 LAB — BASIC METABOLIC PANEL
Anion gap: 9 (ref 5–15)
BUN: 27 mg/dL — AB (ref 6–20)
CHLORIDE: 103 mmol/L (ref 101–111)
CO2: 27 mmol/L (ref 22–32)
CREATININE: 1.58 mg/dL — AB (ref 0.44–1.00)
Calcium: 9.1 mg/dL (ref 8.9–10.3)
GFR calc non Af Amer: 28 mL/min — ABNORMAL LOW (ref >60)
GFR, EST AFRICAN AMERICAN: 32 mL/min — AB (ref >60)
GLUCOSE: 109 mg/dL — AB (ref 65–99)
Potassium: 4.2 mmol/L (ref 3.5–5.1)
Sodium: 139 mmol/L (ref 135–145)

## 2017-01-14 NOTE — ED Notes (Signed)
Pt. Stated she was leaving, I tried to convince them to stay, but she was exhausted. I told her that if symptoms get worse, to please come back, they understood.

## 2017-01-14 NOTE — ED Provider Notes (Cosign Needed)
Patient placed in Quick Look pathway, seen and evaluated for chief complaint of Afib with RVR this AM. Sent by Cards via tlephone.  Pertinent H&P findings include no chest pain. Mild SOB. symptoms resolved after PO medications. No symptoms currently. No cough/URI symptoms. No fevers. Based on initial evaluation, labs are indicated and radiology studies are not indicated.  Patient counseled on process, plan, and necessity for staying for completing the evaluation.    Shary Decamp, PA-C 01/14/17 [phone]

## 2017-01-14 NOTE — Telephone Encounter (Signed)
Please call,heart rate is up to 144.Sje says she is just a little short of breath and she took her medicine about 2hrs ago.She says she sometimes feel a heaviness and fluttering in her chest.Please call to advise.

## 2017-01-14 NOTE — Telephone Encounter (Signed)
Spoke with patient and she stated HR 137 last night on blood pressure machine and this am when hospice nurse seeing husband 144. Patient thinks she has been back in Afib for a couple weeks. Has heaviness in chest comes and goes. Advised patient needs to go to [facility] ED, verbalized understanding. Trish Engineer, maintenance (IT) notified

## 2017-01-14 NOTE — ED Triage Notes (Addendum)
Pt arrives from home reporting palpitations with SOB this am, reports talking with PCP, taking PO dilt and metoprolol without improvement.  Pt reports symptoms improved while waiting to be seen. Pt denies SOB, palpitations, CP, recent illness. Resp e/u.

## 2017-01-14 NOTE — Telephone Encounter (Signed)
Thanks for letting me know.  Dr. H 

## 2017-01-15 ENCOUNTER — Telehealth: Payer: Self-pay | Admitting: Internal Medicine

## 2017-01-15 NOTE — Telephone Encounter (Signed)
New message    Patient calling with concerns of HR. Patient went to [facility] on 01/14/17, states she left without being seen because of long wait.  Please call  STAT if HR is under 50 or over 120 (normal HR is 60-100 beats per minute)  1) What is your heart rate? 107 last night  2) Do you have a log of your heart rate readings (document readings)? n/a  3) Do you have any other symptoms? Chest heaviness on 1/09

## 2017-01-15 NOTE — Telephone Encounter (Signed)
Returned the call to the patient. She stated that she went to the ED last night for possible atrial fibrillation. Her heart rate at home was 144. She had asked her husband's hospice nurse to check it for her. She was symptomatic with shortness of breath and some chest pressure. She stated that this occurs approximately every three months. She ended up in the ED for 6 hours and had to leave early to care for her husband who has alzheimer.  She stated that she feels better this morning. She feels like her heart rate has decreased and she is no longer symptomatic. She takes Cardizem 240 mg daily and Metoprolol 50 mg bid. Per dr. Debara Pickett, he has recommended she be seen at the atrial fibrillation clinic. Scheduling has been notified of the need for an appointment as soon as possible. The patient has verbalized her understanding. She stated that she will go back to the ED if she becomes symptomatic again.

## 2017-01-20 ENCOUNTER — Ambulatory Visit (HOSPITAL_COMMUNITY)
Admission: RE | Admit: 2017-01-20 | Discharge: 2017-01-20 | Disposition: A | Discharge status: Home / Self Care | Payer: Medicare Other | Source: Ambulatory Visit | Attending: Nurse Practitioner | Admitting: Nurse Practitioner

## 2017-01-20 VITALS — BP 126/72 | HR 118 | Ht 66.0 in | Wt 154.0 lb

## 2017-01-20 DIAGNOSIS — M353 Polymyalgia rheumatica: Secondary | ICD-10-CM | POA: Diagnosis not present

## 2017-01-20 DIAGNOSIS — Z9012 Acquired absence of left breast and nipple: Secondary | ICD-10-CM | POA: Diagnosis not present

## 2017-01-20 DIAGNOSIS — Z9889 Other specified postprocedural states: Secondary | ICD-10-CM | POA: Insufficient documentation

## 2017-01-20 DIAGNOSIS — Z85828 Personal history of other malignant neoplasm of skin: Secondary | ICD-10-CM | POA: Diagnosis not present

## 2017-01-20 DIAGNOSIS — M810 Age-related osteoporosis without current pathological fracture: Secondary | ICD-10-CM | POA: Insufficient documentation

## 2017-01-20 DIAGNOSIS — Z880 Allergy status to penicillin: Secondary | ICD-10-CM | POA: Insufficient documentation

## 2017-01-20 DIAGNOSIS — Z823 Family history of stroke: Secondary | ICD-10-CM | POA: Diagnosis not present

## 2017-01-20 DIAGNOSIS — Z841 Family history of disorders of kidney and ureter: Secondary | ICD-10-CM | POA: Diagnosis not present

## 2017-01-20 DIAGNOSIS — Z79899 Other long term (current) drug therapy: Secondary | ICD-10-CM | POA: Insufficient documentation

## 2017-01-20 DIAGNOSIS — Z853 Personal history of malignant neoplasm of breast: Secondary | ICD-10-CM | POA: Insufficient documentation

## 2017-01-20 DIAGNOSIS — I480 Paroxysmal atrial fibrillation: Secondary | ICD-10-CM

## 2017-01-20 DIAGNOSIS — Z803 Family history of malignant neoplasm of breast: Secondary | ICD-10-CM | POA: Diagnosis not present

## 2017-01-20 DIAGNOSIS — Z885 Allergy status to narcotic agent status: Secondary | ICD-10-CM | POA: Diagnosis not present

## 2017-01-20 DIAGNOSIS — Z7902 Long term (current) use of antithrombotics/antiplatelets: Secondary | ICD-10-CM | POA: Diagnosis not present

## 2017-01-20 DIAGNOSIS — Z8249 Family history of ischemic heart disease and other diseases of the circulatory system: Secondary | ICD-10-CM | POA: Insufficient documentation

## 2017-01-20 DIAGNOSIS — N189 Chronic kidney disease, unspecified: Secondary | ICD-10-CM | POA: Insufficient documentation

## 2017-01-20 DIAGNOSIS — I129 Hypertensive chronic kidney disease with stage 1 through stage 4 chronic kidney disease, or unspecified chronic kidney disease: Secondary | ICD-10-CM | POA: Insufficient documentation

## 2017-01-20 DIAGNOSIS — Z87891 Personal history of nicotine dependence: Secondary | ICD-10-CM | POA: Diagnosis not present

## 2017-01-20 DIAGNOSIS — H3530 Unspecified macular degeneration: Secondary | ICD-10-CM | POA: Diagnosis not present

## 2017-01-20 MED ORDER — METOPROLOL SUCCINATE ER 50 MG PO TB24: ORAL_TABLET | ORAL | 10 refills | Status: DC

## 2017-01-20 NOTE — Patient Instructions (Signed)
Increase metoprolol to 75mg  twice a day (1 and 1/2 tablets twice a day)

## 2017-01-21 NOTE — Progress Notes (Signed)
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 female, appearing younger than stated age,with a h/o paroxysmal afib, dx fall of 2017, with successful cardioversion, but has had increase in  afib burden  since August. She was in [place] in November when seen by Dr. Debara Pickett. She is in the afib clinic to discuss options to treat.. Pt states that she has been under a lot of stress due to her 29 yo husband with progressive dementia and Hospice is now in the home.  She is busy during the day and does not notice her afib that much. She does notice it for the first several minutes trying to get to sleep. Does not notice any undo shortness of breath with exertion or fatigue. She is on apixaban correctly dosed at 2.5 mg bid for a chadsvasc score of 4. She noted increase in HR around 1/9 and was advised to go to the ER, waited 6 hours and had not been seen so left.  Today, she denies symptoms of palpitations, chest pain, shortness of breath, orthopnea, PND, lower extremity edema, dizziness, presyncope, syncope, or neurologic sequela. The patient is tolerating medications without difficulties and is otherwise without complaint today.   Past Medical History:  Diagnosis Date  . Atrial fibrillation ([place]) 2017   a. s/p DCCV in 10/2015  b. recurrent in 08/2016 --> rate-control pursued.   . Cancer ([place])    Breast  . CKD (chronic kidney disease)   . GCA (giant cell arteritis) ([place])   . Hypertension   . Macular degeneration   . Osteoporosis   . PMR (polymyalgia rheumatica) ([facility])   . Skin cancer    Past Surgical History:  Procedure Laterality Date  . CARDIOVERSION N/A 10/18/2015   Procedure: CARDIOVERSION;  Surgeon: Jerline Pain, MD;  Location: [facility];  Service: Cardiovascular;  Laterality: N/A;  . KNEE SURGERY  2013  . MASTECTOMY  1998   left side    Current Outpatient Medications  Medication Sig Dispense Refill  . apixaban (ELIQUIS)  2.5 MG TABS tablet Take 1 tablet (2.5 mg total) by mouth 2 (two) times daily. 60 tablet 5  . CARTIA XT 180 MG 24 hr capsule Take 1 tablet daily by mouth.  0  . cloNIDine (CATAPRES) 0.2 MG tablet Take 1 tablet (0.2 mg total) by mouth 3 (three) times daily. Per PCP 90 tablet 5  . diazepam (VALIUM) 2 MG tablet Take 1-2 mg by mouth at bedtime as needed (sleep).   0  . furosemide (LASIX) 80 MG tablet Take 0.5 tablets (40 mg total) by mouth daily. Per PCP    . losartan (COZAAR) 100 MG tablet Take 100 mg by mouth daily. Per PCP     . Melatonin 5 MG TABS Take 5 mg by mouth at bedtime.    . metoprolol succinate (TOPROL-XL) 50 MG 24 hr tablet Take 1 and 1/2 tablet twice a day 60 tablet 10  . Multiple Vitamins-Minerals (PRESERVISION AREDS 2) CAPS Take 1 capsule by mouth daily.    . polyethylene glycol (MIRALAX / GLYCOLAX) packet Take 17 g by mouth daily as needed (constipation). Mix in 8 oz liquid and drink     No current facility-administered medications for this encounter.     Allergies  Allergen Reactions  . Codeine Itching  . Penicillins Itching and Rash    Has patient had a PCN reaction causing immediate rash, facial/tongue/throat swelling, SOB or lightheadedness with  hypotension: Yes Has patient had a PCN reaction causing severe rash involving mucus membranes or skin necrosis: No Has patient had a PCN reaction that required hospitalization: No Has patient had a PCN reaction occurring within the last 10 years: No If all of the above answers are "NO", then may proceed with Cephalosporin use.    Social History   Socioeconomic History  . Marital status: Married    Spouse name: Not on file  . Number of children: 2  . Years of education: Not on file  . Highest education level: Not on file  Social Needs  . Financial resource strain: Not on file  . Food insecurity - worry: Not on file  . Food insecurity - inability: Not on file  . Transportation needs - medical: Not on file  . Transportation  needs - non-medical: Not on file  Occupational History  . Not on file  Tobacco Use  . Smoking status: Former Smoker    Last attempt to quit: 08/31/1966    Years since quitting: 50.4  . Smokeless tobacco: Never Used  Substance and Sexual Activity  . Alcohol use: No  . Drug use: No  . Sexual activity: Not on file  Other Topics Concern  . Not on file  Social History Narrative   epworth sleepiness scale = 8 (08/31/15)    Family History  Problem Relation Age of Onset  . Stroke Mother   . Kidney failure Father        died at 29  . Heart disease Sister        died at 59  . Heart disease Sister        died at 75  . Breast cancer Daughter     ROS- All systems are reviewed and negative except as per the HPI above  Physical Exam: Vitals:   01/20/17 1401  BP: 126/72  Pulse: (!) 118  Weight: 154 lb (69.9 kg)  Height: 5\' 6"  (1.676 m)   Wt Readings from Last 3 Encounters:  01/20/17 154 lb (69.9 kg)  01/14/17 150 lb (68 kg)  11/24/16 150 lb (68 kg)    Labs: Lab Results  Component Value Date   NA 139 01/14/2017   K 4.2 01/14/2017   CL 103 01/14/2017   CO2 27 01/14/2017   GLUCOSE 109 (H) 01/14/2017   BUN 27 (H) 01/14/2017   CREATININE 1.58 (H) 01/14/2017   CALCIUM 9.1 01/14/2017   MG 2.2 08/18/2016   Lab Results  Component Value Date   INR 1.1 10/11/2015   Lab Results  Component Value Date   CHOL  07/04/2009    81        ATP III CLASSIFICATION:  <200     mg/dL   Desirable  200-239  mg/dL   Borderline High  >=240    mg/dL   High          HDL 18 (L) 07/04/2009   LDLCALC  07/04/2009    50        Total Cholesterol/HDL:CHD Risk Coronary Heart Disease Risk Table                     Men   Women  1/2 Average Risk   3.4   3.3  Average Risk       5.0   4.4  2 X Average Risk   9.6   7.1  3 X Average Risk  23.4   11.0  Use the calculated Patient Ratio above and the CHD Risk Table to determine the patient's CHD Risk.        ATP III CLASSIFICATION (LDL):   <100     mg/dL   Optimal  100-129  mg/dL   Near or Above                    Optimal  130-159  mg/dL   Borderline  160-189  mg/dL   High  >190     mg/dL   Very High   TRIG 64 07/04/2009     GEN- The patient is well appearing, alert and oriented x 3 today.   Head- normocephalic, atraumatic Eyes-  Sclera clear, conjunctiva pink Ears- hearing intact Oropharynx- clear Neck- supple, no JVP Lymph- no cervical lymphadenopathy Lungs- Clear to ausculation bilaterally, normal work of breathing Heart-irregular rate and rhythm, no murmurs, rubs or gallops, PMI not laterally displaced GI- soft, NT, ND, + BS Extremities- no clubbing, cyanosis, or edema MS- no significant deformity or atrophy Skin- no rash or lesion Psych- euthymic mood, full affect Neuro- strength and sensation are intact  EKG-atrial flutter with variable, vrs afib, rate at 118 bpm, qrs int 78 ms, qtc 482 ms    Assessment and Plan: 1. H/o afib Discussed otpions with pt Rate control vrs amiodarone, renal function may preclude use of flecainde, tikosyn, sotalol with a crcl of 26  She currently does not appear to be too symptomatic with afib but could stand more rate control Cardizem increase in the past dropped her BP She would like for now to try small increase in metoprolol to see if she can tolerate and make  the irregular HR at night less noticeable Will try to increase metoprolol to 75 mg bid form 50 mg bid  She will continue apixaban 2.5 mg bid for chadsvasc score of 4  Will see back in one week for effect and will discuss management further  Butch Penny C. Reeves Musick, [facility] [address] [place], [place] [address] [phone]

## 2017-01-21 NOTE — Addendum Note (Signed)
Encounter addended by: Sherran Needs, NP on: 01/21/2017 8:51 AM  Actions taken: LOS modified

## 2017-01-27 ENCOUNTER — Encounter (HOSPITAL_COMMUNITY): Payer: Self-pay | Admitting: Nurse Practitioner

## 2017-01-27 ENCOUNTER — Ambulatory Visit (HOSPITAL_COMMUNITY)
Admission: RE | Admit: 2017-01-27 | Discharge: 2017-01-27 | Disposition: A | Discharge status: Home / Self Care | Payer: Medicare Other | Source: Ambulatory Visit | Attending: Nurse Practitioner | Admitting: Nurse Practitioner

## 2017-01-27 VITALS — BP 146/82 | HR 109 | Ht 66.0 in

## 2017-01-27 DIAGNOSIS — I481 Persistent atrial fibrillation: Secondary | ICD-10-CM | POA: Diagnosis not present

## 2017-01-27 DIAGNOSIS — Z7901 Long term (current) use of anticoagulants: Secondary | ICD-10-CM | POA: Diagnosis not present

## 2017-01-27 DIAGNOSIS — N189 Chronic kidney disease, unspecified: Secondary | ICD-10-CM | POA: Diagnosis not present

## 2017-01-27 DIAGNOSIS — H3530 Unspecified macular degeneration: Secondary | ICD-10-CM | POA: Insufficient documentation

## 2017-01-27 DIAGNOSIS — Z880 Allergy status to penicillin: Secondary | ICD-10-CM | POA: Insufficient documentation

## 2017-01-27 DIAGNOSIS — I480 Paroxysmal atrial fibrillation: Secondary | ICD-10-CM | POA: Insufficient documentation

## 2017-01-27 DIAGNOSIS — M353 Polymyalgia rheumatica: Secondary | ICD-10-CM | POA: Insufficient documentation

## 2017-01-27 DIAGNOSIS — Z87891 Personal history of nicotine dependence: Secondary | ICD-10-CM | POA: Insufficient documentation

## 2017-01-27 DIAGNOSIS — I129 Hypertensive chronic kidney disease with stage 1 through stage 4 chronic kidney disease, or unspecified chronic kidney disease: Secondary | ICD-10-CM | POA: Insufficient documentation

## 2017-01-27 DIAGNOSIS — Z79899 Other long term (current) drug therapy: Secondary | ICD-10-CM | POA: Insufficient documentation

## 2017-01-27 DIAGNOSIS — I4891 Unspecified atrial fibrillation: Secondary | ICD-10-CM | POA: Diagnosis present

## 2017-01-27 MED ORDER — DILTIAZEM HCL 30 MG PO TABS: ORAL_TABLET | ORAL | 1 refills | Status: DC

## 2017-01-27 NOTE — Patient Instructions (Signed)
Your physician has recommended you make the following change in your medication: 1)Cardizem 30mg  -- take 1 tablet every 4 hours AS NEEDED for AFIB heart rate over 100 as long as top number of blood pressure >100.

## 2017-01-27 NOTE — Progress Notes (Signed)
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 female, appearing younger than stated age,with a h/o paroxysmal afib, dx fall of 2017, with successful cardioversion, but has had increase in  afib burden  since August. She was in [name] in November when seen by Dr. Debara Pickett. She is in the afib clinic to discuss options to treat.. Pt states that she has been under a lot of stress due to her 49 yo husband with progressive dementia and Hospice is now in the home. She feel that she has been in persistent afib since December.  She is busy during the day and does not notice her afib that much. She does notice it for the first several minutes trying to get to sleep. Does not notice any undo shortness of breath with exertion or fatigue. She is on apixaban correctly dosed at 2.5 mg bid for a chadsvasc score of 4. She noted increase in HR around 1/9 and was advised to go to the ER, waited 6 hours and had not been seen so left. She feels this elevated HR was due to stress over husband.  When seen in the afib clinic one week ago, we discussed options to manage her afib. She does not sound that symptomatic. BB was increased and she is for f/u. Her BP has been stable on higher dose of BB. HR's for the most part have her reasonably rate controlled. Offered cardioversion or amiodarone but she defers at this time.  Today, she denies symptoms of palpitations, chest pain, shortness of breath, orthopnea, PND, lower extremity edema, dizziness, presyncope, syncope, or neurologic sequela. The patient is tolerating medications without difficulties and is otherwise without complaint today.   Past Medical History:  Diagnosis Date  . Atrial fibrillation ([place]) 2017   a. s/p DCCV in 10/2015  b. recurrent in 08/2016 --> rate-control pursued.   . Cancer ([place])    Breast  . CKD (chronic kidney disease)   . GCA (giant cell arteritis) ([place])   . Hypertension   . Macular  degeneration   . Osteoporosis   . PMR (polymyalgia rheumatica) ([facility])   . Skin cancer    Past Surgical History:  Procedure Laterality Date  . CARDIOVERSION N/A 10/18/2015   Procedure: CARDIOVERSION;  Surgeon: Jerline Pain, MD;  Location: [facility];  Service: Cardiovascular;  Laterality: N/A;  . KNEE SURGERY  2013  . MASTECTOMY  1998   left side    Current Outpatient Medications  Medication Sig Dispense Refill  . apixaban (ELIQUIS) 2.5 MG TABS tablet Take 1 tablet (2.5 mg total) by mouth 2 (two) times daily. 60 tablet 5  . CARTIA XT 180 MG 24 hr capsule Take 1 tablet daily by mouth.  0  . cloNIDine (CATAPRES) 0.2 MG tablet Take 1 tablet (0.2 mg total) by mouth 3 (three) times daily. Per PCP 90 tablet 5  . diazepam (VALIUM) 2 MG tablet Take 1-2 mg by mouth at bedtime as needed (sleep).   0  . furosemide (LASIX) 80 MG tablet Take 0.5 tablets (40 mg total) by mouth daily. Per PCP    . losartan (COZAAR) 100 MG tablet Take 100 mg by mouth daily. Per PCP     . Melatonin 5 MG TABS Take 5 mg by mouth at bedtime.    . metoprolol succinate (TOPROL-XL) 50 MG 24 hr tablet Take 1 and 1/2 tablet twice a day 60 tablet 10  . Multiple Vitamins-Minerals (  PRESERVISION AREDS 2) CAPS Take 1 capsule by mouth daily.    . polyethylene glycol (MIRALAX / GLYCOLAX) packet Take 17 g by mouth daily as needed (constipation). Mix in 8 oz liquid and drink    . diltiazem (CARDIZEM) 30 MG tablet Take 1 tablet every 4 hours AS NEEDED for AFIB heart rate over 100 45 tablet 1   No current facility-administered medications for this encounter.     Allergies  Allergen Reactions  . Codeine Itching  . Penicillins Itching and Rash    Has patient had a PCN reaction causing immediate rash, facial/tongue/throat swelling, SOB or lightheadedness with hypotension: Yes Has patient had a PCN reaction causing severe rash involving mucus membranes or skin necrosis: No Has patient had a PCN reaction that required hospitalization:  No Has patient had a PCN reaction occurring within the last 10 years: No If all of the above answers are "NO", then may proceed with Cephalosporin use.    Social History   Socioeconomic History  . Marital status: Married    Spouse name: Not on file  . Number of children: 2  . Years of education: Not on file  . Highest education level: Not on file  Social Needs  . Financial resource strain: Not on file  . Food insecurity - worry: Not on file  . Food insecurity - inability: Not on file  . Transportation needs - medical: Not on file  . Transportation needs - non-medical: Not on file  Occupational History  . Not on file  Tobacco Use  . Smoking status: Former Smoker    Last attempt to quit: 08/31/1966    Years since quitting: 50.4  . Smokeless tobacco: Never Used  Substance and Sexual Activity  . Alcohol use: No  . Drug use: No  . Sexual activity: Not on file  Other Topics Concern  . Not on file  Social History Narrative   epworth sleepiness scale = 8 (08/31/15)    Family History  Problem Relation Age of Onset  . Stroke Mother   . Kidney failure Father        died at 37  . Heart disease Sister        died at 99  . Heart disease Sister        died at 76  . Breast cancer Daughter     ROS- All systems are reviewed and negative except as per the HPI above  Physical Exam: Vitals:   01/27/17 1402  BP: (!) 146/82  Pulse: (!) 109  Height: 5\' 6"  (1.676 m)   Wt Readings from Last 3 Encounters:  01/20/17 154 lb (69.9 kg)  01/14/17 150 lb (68 kg)  11/24/16 150 lb (68 kg)    Labs: Lab Results  Component Value Date   NA 139 01/14/2017   K 4.2 01/14/2017   CL 103 01/14/2017   CO2 27 01/14/2017   GLUCOSE 109 (H) 01/14/2017   BUN 27 (H) 01/14/2017   CREATININE 1.58 (H) 01/14/2017   CALCIUM 9.1 01/14/2017   MG 2.2 08/18/2016   Lab Results  Component Value Date   INR 1.1 10/11/2015   Lab Results  Component Value Date   CHOL  07/04/2009    81        ATP III  CLASSIFICATION:  <200     mg/dL   Desirable  200-239  mg/dL   Borderline High  >=240    mg/dL   High  HDL 18 (L) 07/04/2009   LDLCALC  07/04/2009    50        Total Cholesterol/HDL:CHD Risk Coronary Heart Disease Risk Table                     Men   Women  1/2 Average Risk   3.4   3.3  Average Risk       5.0   4.4  2 X Average Risk   9.6   7.1  3 X Average Risk  23.4   11.0        Use the calculated Patient Ratio above and the CHD Risk Table to determine the patient's CHD Risk.        ATP III CLASSIFICATION (LDL):  <100     mg/dL   Optimal  100-129  mg/dL   Near or Above                    Optimal  130-159  mg/dL   Borderline  160-189  mg/dL   High  >190     mg/dL   Very High   TRIG 64 07/04/2009     GEN- The patient is well appearing, alert and oriented x 3 today.   Head- normocephalic, atraumatic Eyes-  Sclera clear, conjunctiva pink Ears- hearing intact Oropharynx- clear Neck- supple, no JVP Lymph- no cervical lymphadenopathy Lungs- Clear to ausculation bilaterally, normal work of breathing Heart-irregular rate and rhythm, no murmurs, rubs or gallops, PMI not laterally displaced GI- soft, NT, ND, + BS Extremities- no clubbing, cyanosis, or edema MS- no significant deformity or atrophy Skin- no rash or lesion Psych- euthymic mood, full affect Neuro- strength and sensation are intact  EKG-atrial fib at 109 bpm,,rate at 109 bpm, qrs int 82 ms, qtc 447 ms ( HR's at home mostly in the 80-90's)    Assessment and Plan: 1. Persistent  afib Discussed otpions with pt Rate control vrs amiodarone, renal function may preclude use of flecainde, tikosyn, sotalol with a crcl of 26 She currently does not appear to be too symptomatic with afib  Does not want to purse cardioversion at this point Is not interested in amiodarone now Cardizem daily increase in the past dropped her BP Contiue metoprolol 75 mg bid Will RX 30 mg Cardizem to have on hand if needed for  spikes in BP  She will continue apixaban 2.5 mg bid for chadsvasc score of 4  She is willing to record HR/BP readings 2x a day for 2 weeks and then she will return to the clinic for further discussion/amangement  Allison Norman, [facility] [address] [place], [place] [address] [phone]

## 2017-01-30 ENCOUNTER — Other Ambulatory Visit: Payer: Self-pay | Admitting: Student

## 2017-02-02 NOTE — Telephone Encounter (Signed)
Rx(s) sent to pharmacy electronically.  

## 2017-02-03 DIAGNOSIS — H35371 Puckering of macula, right eye: Secondary | ICD-10-CM | POA: Diagnosis not present

## 2017-02-03 DIAGNOSIS — H353211 Exudative age-related macular degeneration, right eye, with active choroidal neovascularization: Secondary | ICD-10-CM | POA: Diagnosis not present

## 2017-02-03 DIAGNOSIS — H353112 Nonexudative age-related macular degeneration, right eye, intermediate dry stage: Secondary | ICD-10-CM | POA: Diagnosis not present

## 2017-02-03 DIAGNOSIS — H353122 Nonexudative age-related macular degeneration, left eye, intermediate dry stage: Secondary | ICD-10-CM | POA: Diagnosis not present

## 2017-02-03 DIAGNOSIS — H353222 Exudative age-related macular degeneration, left eye, with inactive choroidal neovascularization: Secondary | ICD-10-CM | POA: Diagnosis not present

## 2017-02-06 ENCOUNTER — Telehealth (HOSPITAL_COMMUNITY): Payer: Self-pay | Admitting: *Deleted

## 2017-02-06 NOTE — Telephone Encounter (Signed)
Pt called in stating since checking her BP/HR daily  - she has continued to notice HR in the 99-110 range with her diastolic BP running consistently in the 90s. She has 240mg  of cardizem at home (was decreased back in the summer to 180mg  due to low BP).  With continued HR maintaining above 100 will increase cardizem to 240mg  once a day starting tomorrow since pt has already taken 180mg  today - continue to log BP/HR as instructed until appt on Tuesday.

## 2017-02-10 ENCOUNTER — Encounter (HOSPITAL_COMMUNITY): Payer: Self-pay | Admitting: Nurse Practitioner

## 2017-02-10 ENCOUNTER — Ambulatory Visit (HOSPITAL_COMMUNITY)
Admission: RE | Admit: 2017-02-10 | Discharge: 2017-02-10 | Disposition: A | Discharge status: Home / Self Care | Payer: Medicare Other | Source: Ambulatory Visit | Attending: Nurse Practitioner | Admitting: Nurse Practitioner

## 2017-02-10 VITALS — BP 122/64 | HR 74 | Ht 66.0 in | Wt 154.0 lb

## 2017-02-10 DIAGNOSIS — Z823 Family history of stroke: Secondary | ICD-10-CM | POA: Insufficient documentation

## 2017-02-10 DIAGNOSIS — N189 Chronic kidney disease, unspecified: Secondary | ICD-10-CM | POA: Insufficient documentation

## 2017-02-10 DIAGNOSIS — I480 Paroxysmal atrial fibrillation: Secondary | ICD-10-CM

## 2017-02-10 DIAGNOSIS — Z8249 Family history of ischemic heart disease and other diseases of the circulatory system: Secondary | ICD-10-CM | POA: Diagnosis not present

## 2017-02-10 DIAGNOSIS — I129 Hypertensive chronic kidney disease with stage 1 through stage 4 chronic kidney disease, or unspecified chronic kidney disease: Secondary | ICD-10-CM | POA: Insufficient documentation

## 2017-02-10 DIAGNOSIS — Z9889 Other specified postprocedural states: Secondary | ICD-10-CM | POA: Diagnosis not present

## 2017-02-10 DIAGNOSIS — Z87891 Personal history of nicotine dependence: Secondary | ICD-10-CM | POA: Insufficient documentation

## 2017-02-10 DIAGNOSIS — I481 Persistent atrial fibrillation: Secondary | ICD-10-CM | POA: Diagnosis not present

## 2017-02-10 DIAGNOSIS — Z9012 Acquired absence of left breast and nipple: Secondary | ICD-10-CM | POA: Diagnosis not present

## 2017-02-10 DIAGNOSIS — Z803 Family history of malignant neoplasm of breast: Secondary | ICD-10-CM | POA: Insufficient documentation

## 2017-02-10 DIAGNOSIS — Z880 Allergy status to penicillin: Secondary | ICD-10-CM | POA: Insufficient documentation

## 2017-02-10 DIAGNOSIS — Z7901 Long term (current) use of anticoagulants: Secondary | ICD-10-CM | POA: Insufficient documentation

## 2017-02-10 DIAGNOSIS — Z79899 Other long term (current) drug therapy: Secondary | ICD-10-CM | POA: Insufficient documentation

## 2017-02-10 DIAGNOSIS — Z885 Allergy status to narcotic agent status: Secondary | ICD-10-CM | POA: Diagnosis not present

## 2017-02-10 DIAGNOSIS — Z85828 Personal history of other malignant neoplasm of skin: Secondary | ICD-10-CM | POA: Diagnosis not present

## 2017-02-10 LAB — BASIC METABOLIC PANEL
Anion gap: 13 (ref 5–15)
BUN: 31 mg/dL — AB (ref 6–20)
CALCIUM: 9 mg/dL (ref 8.9–10.3)
CO2: 23 mmol/L (ref 22–32)
Chloride: 104 mmol/L (ref 101–111)
Creatinine, Ser: 1.66 mg/dL — ABNORMAL HIGH (ref 0.44–1.00)
GFR calc Af Amer: 30 mL/min — ABNORMAL LOW (ref >60)
GFR, EST NON AFRICAN AMERICAN: 26 mL/min — AB (ref >60)
GLUCOSE: 155 mg/dL — AB (ref 65–99)
POTASSIUM: 3.7 mmol/L (ref 3.5–5.1)
SODIUM: 140 mmol/L (ref 135–145)

## 2017-02-10 LAB — CBC
HCT: 41.4 % (ref 36.0–46.0)
Hemoglobin: 12.9 g/dL (ref 12.0–15.0)
MCH: 30.9 pg (ref 26.0–34.0)
MCHC: 31.2 g/dL (ref 30.0–36.0)
MCV: 99 fL (ref 78.0–100.0)
PLATELETS: 254 10*3/uL (ref 150–400)
RBC: 4.18 MIL/uL (ref 3.87–5.11)
RDW: 15.2 % (ref 11.5–15.5)
WBC: 6 10*3/uL (ref 4.0–10.5)

## 2017-02-10 NOTE — Progress Notes (Signed)
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 female, appearing younger than stated age,with a h/o paroxysmal afib, dx fall of 2017, with successful cardioversion, but has had increase in  afib burden  since August. She was in [place] in November when seen by Dr. Debara Pickett. She is in the afib clinic to discuss options to treat.. Pt states that she has been under a lot of stress due to her 51 yo husband with progressive dementia and Hospice is now in the home. She feel that she has been in persistent afib since December.  She is busy during the day and does not notice her afib that much. She does notice it for the first several minutes trying to get to sleep. Does not notice any undo shortness of breath with exertion or fatigue. She is on apixaban correctly dosed at 2.5 mg bid for a chadsvasc score of 4. She noted increase in HR around 1/9 and was advised to go to the ER, waited 6 hours and had not been seen so left. She feels this elevated HR was due to stress over husband.  When seen in the afib clinic one week ago, we discussed options to manage her afib. She does not sound that symptomatic. BB was increased and she is for f/u. Her BP has been stable on higher dose of BB. HR's for the most part have her reasonably rate controlled. Offered cardioversion or amiodarone but she defers at this time.  F/u in afib clinic, she is now on cardizem 240 mg a day, dose increased last week when she felt her HR was still running too high. HR in office  now in the 70's. She has thought about options and would like to try a cardioversion. Last one held her almost a year. No missed does of eliquis.  Today, she denies symptoms of palpitations, chest pain, shortness of breath, orthopnea, PND, lower extremity edema, dizziness, presyncope, syncope, or neurologic sequela. The patient is tolerating medications without difficulties and is otherwise without complaint  today.   Past Medical History:  Diagnosis Date  . Atrial fibrillation ([facility]) 2017   a. s/p DCCV in 10/2015  b. recurrent in 08/2016 --> rate-control pursued.   . Cancer ([facility])    Breast  . CKD (chronic kidney disease)   . GCA (giant cell arteritis) ([facility])   . Hypertension   . Macular degeneration   . Osteoporosis   . PMR (polymyalgia rheumatica) ([facility])   . Skin cancer    Past Surgical History:  Procedure Laterality Date  . CARDIOVERSION N/A 10/18/2015   Procedure: CARDIOVERSION;  Surgeon: Jerline Pain, MD;  Location: [facility];  Service: Cardiovascular;  Laterality: N/A;  . KNEE SURGERY  2013  . MASTECTOMY  1998   left side    Current Outpatient Medications  Medication Sig Dispense Refill  . apixaban (ELIQUIS) 2.5 MG TABS tablet Take 1 tablet (2.5 mg total) by mouth 2 (two) times daily. 60 tablet 5  . cloNIDine (CATAPRES) 0.2 MG tablet Take 1 tablet (0.2 mg total) by mouth 3 (three) times daily. 270 tablet 1  . diazepam (VALIUM) 2 MG tablet Take 1-2 mg by mouth at bedtime as needed (sleep).   0  . diltiazem (CARDIZEM CD) 240 MG 24 hr capsule Take 240 mg by mouth daily.    [name] diltiazem (CARDIZEM) 30 MG tablet Take 1 tablet every 4 hours AS NEEDED for AFIB heart rate  over 100 45 tablet 1  . furosemide (LASIX) 80 MG tablet Take 0.5 tablets (40 mg total) by mouth daily. Per PCP    . losartan (COZAAR) 100 MG tablet Take 100 mg by mouth daily. Per PCP     . metoprolol succinate (TOPROL-XL) 50 MG 24 hr tablet Take 50 mg by mouth 2 (two) times daily. Take with or immediately following a meal.    . Multiple Vitamins-Minerals (PRESERVISION AREDS 2) CAPS Take 1 capsule by mouth daily.    [name] pyridoxine (B-6) 100 MG tablet Take 100 mg by mouth daily.     No current facility-administered medications for this encounter.     Allergies  Allergen Reactions  . Codeine Itching  . Penicillins Itching and Rash    Has patient had a PCN reaction causing immediate rash, facial/tongue/throat  swelling, SOB or lightheadedness with hypotension: Yes Has patient had a PCN reaction causing severe rash involving mucus membranes or skin necrosis: No Has patient had a PCN reaction that required hospitalization: No Has patient had a PCN reaction occurring within the last 10 years: No If all of the above answers are "NO", then may proceed with Cephalosporin use.    Social History   Socioeconomic History  . Marital status: Married    Spouse name: Not on file  . Number of children: 2  . Years of education: Not on file  . Highest education level: Not on file  Social Needs  . Financial resource strain: Not on file  . Food insecurity - worry: Not on file  . Food insecurity - inability: Not on file  . Transportation needs - medical: Not on file  . Transportation needs - non-medical: Not on file  Occupational History  . Not on file  Tobacco Use  . Smoking status: Former Smoker    Last attempt to quit: 08/31/1966    Years since quitting: 50.4  . Smokeless tobacco: Never Used  Substance and Sexual Activity  . Alcohol use: No  . Drug use: No  . Sexual activity: Not on file  Other Topics Concern  . Not on file  Social History Narrative   epworth sleepiness scale = 8 (08/31/15)    Family History  Problem Relation Age of Onset  . Stroke Mother   . Kidney failure Father        died at 87  . Heart disease Sister        died at 75  . Heart disease Sister        died at 60  . Breast cancer Daughter     ROS- All systems are reviewed and negative except as per the HPI above  Physical Exam: Vitals:   02/10/17 1416  BP: 122/64  Pulse: 74  Weight: 154 lb (69.9 kg)  Height: 5\' 6"  (1.676 m)   Wt Readings from Last 3 Encounters:  02/10/17 154 lb (69.9 kg)  01/20/17 154 lb (69.9 kg)  01/14/17 150 lb (68 kg)    Labs: Lab Results  Component Value Date   NA 140 02/10/2017   K 3.7 02/10/2017   CL 104 02/10/2017   CO2 23 02/10/2017   GLUCOSE 155 (H) 02/10/2017   BUN 31 (H)  02/10/2017   CREATININE 1.66 (H) 02/10/2017   CALCIUM 9.0 02/10/2017   MG 2.2 08/18/2016   Lab Results  Component Value Date   INR 1.1 10/11/2015   Lab Results  Component Value Date   CHOL  07/04/2009    81  ATP III CLASSIFICATION:  <200     mg/dL   Desirable  200-239  mg/dL   Borderline High  >=240    mg/dL   High          HDL 18 (L) 07/04/2009   LDLCALC  07/04/2009    50        Total Cholesterol/HDL:CHD Risk Coronary Heart Disease Risk Table                     Men   Women  1/2 Average Risk   3.4   3.3  Average Risk       5.0   4.4  2 X Average Risk   9.6   7.1  3 X Average Risk  23.4   11.0        Use the calculated Patient Ratio above and the CHD Risk Table to determine the patient's CHD Risk.        ATP III CLASSIFICATION (LDL):  <100     mg/dL   Optimal  100-129  mg/dL   Near or Above                    Optimal  130-159  mg/dL   Borderline  160-189  mg/dL   High  >190     mg/dL   Very High   TRIG 64 07/04/2009     GEN- The patient is well appearing, alert and oriented x 3 today.   Head- normocephalic, atraumatic Eyes-  Sclera clear, conjunctiva pink Ears- hearing intact Oropharynx- clear Neck- supple, no JVP Lymph- no cervical lymphadenopathy Lungs- Clear to ausculation bilaterally, normal work of breathing Heart-irregular rate and rhythm, no murmurs, rubs or gallops, PMI not laterally displaced GI- soft, NT, ND, + BS Extremities- no clubbing, cyanosis, or edema MS- no significant deformity or atrophy Skin- no rash or lesion Psych- euthymic mood, full affect Neuro- strength and sensation are intact  EKG-atrial fib at 74 bpm,rate at 74 bpm, qrs int 82 ms, qtc 439 ms     Assessment and Plan: 1. Persistent  afib Seems to be since late December Discussed otpions with pt Rate control vrs amiodarone, renal function may preclude use of flecainde, tikosyn, sotalol with a crcl of 26 She currently does not appear to be too symptomatic with afib    Pt deferred cardioversion with me early appointment but now would like to proceed Continue cardizem 240 mg a day Contiue metoprolol 75 mg bid Will have to assess HR and BP post cardioversion and adjust meds accordingly Will RX 30 mg Cardizem to have on hand if needed for spikes in BP  She will continue apixaban 2.5 mg bid for chadsvasc score of 4, no missed doses Cbc/bmet today  She will be scheduled for cardioversion 2/25 with Dr. Debara Pickett F/u here in one week Then f/u with Dr. Debara Pickett as recall  Geroge Baseman. Forever Arechiga, [facility] [address] [place], [place] [address] [phone]

## 2017-02-10 NOTE — Patient Instructions (Signed)
Cardioversion scheduled for Friday, February 15th  - Arrive at the [facility] and go to admitting at 9:30AM  -Do not eat or drink anything after midnight the night prior to your procedure.  - Take all your medication with a sip of water prior to arrival.  - You will not be able to drive home after your procedure.

## 2017-02-13 ENCOUNTER — Other Ambulatory Visit (HOSPITAL_COMMUNITY): Payer: Self-pay | Admitting: *Deleted

## 2017-02-13 MED ORDER — METOPROLOL SUCCINATE ER 50 MG PO TB24
50.0000 mg | ORAL_TABLET | Freq: Two times a day (BID) | ORAL | 1 refills | Status: DC
Start: 2017-02-13 — End: 2017-04-20

## 2017-02-20 ENCOUNTER — Ambulatory Visit (HOSPITAL_COMMUNITY)
Admission: RE | Admit: 2017-02-20 | Discharge: 2017-02-20 | Disposition: A | Discharge status: Home / Self Care | Payer: Medicare Other | Source: Ambulatory Visit | Attending: Internal Medicine | Admitting: Internal Medicine

## 2017-02-20 ENCOUNTER — Encounter (HOSPITAL_COMMUNITY): Payer: Self-pay | Admitting: *Deleted

## 2017-02-20 ENCOUNTER — Ambulatory Visit (HOSPITAL_COMMUNITY): Payer: Medicare Other | Admitting: Anesthesiology

## 2017-02-20 ENCOUNTER — Other Ambulatory Visit: Payer: Self-pay

## 2017-02-20 ENCOUNTER — Encounter (HOSPITAL_COMMUNITY)
Admission: RE | Disposition: A | Discharge status: Home / Self Care | Payer: Self-pay | Source: Ambulatory Visit | Attending: Internal Medicine

## 2017-02-20 DIAGNOSIS — I4891 Unspecified atrial fibrillation: Secondary | ICD-10-CM | POA: Diagnosis not present

## 2017-02-20 DIAGNOSIS — Z79899 Other long term (current) drug therapy: Secondary | ICD-10-CM | POA: Diagnosis not present

## 2017-02-20 DIAGNOSIS — Z87891 Personal history of nicotine dependence: Secondary | ICD-10-CM | POA: Diagnosis not present

## 2017-02-20 DIAGNOSIS — I4892 Unspecified atrial flutter: Secondary | ICD-10-CM | POA: Insufficient documentation

## 2017-02-20 DIAGNOSIS — I4819 Other persistent atrial fibrillation: Secondary | ICD-10-CM

## 2017-02-20 DIAGNOSIS — I10 Essential (primary) hypertension: Secondary | ICD-10-CM | POA: Insufficient documentation

## 2017-02-20 DIAGNOSIS — I129 Hypertensive chronic kidney disease with stage 1 through stage 4 chronic kidney disease, or unspecified chronic kidney disease: Secondary | ICD-10-CM | POA: Diagnosis not present

## 2017-02-20 DIAGNOSIS — I481 Persistent atrial fibrillation: Secondary | ICD-10-CM | POA: Diagnosis not present

## 2017-02-20 DIAGNOSIS — I480 Paroxysmal atrial fibrillation: Secondary | ICD-10-CM | POA: Diagnosis not present

## 2017-02-20 DIAGNOSIS — N183 Chronic kidney disease, stage 3 (moderate): Secondary | ICD-10-CM | POA: Diagnosis not present

## 2017-02-20 HISTORY — PX: CARDIOVERSION: SHX1299

## 2017-02-20 LAB — POCT I-STAT, CHEM 8
BUN: 25 mg/dL — ABNORMAL HIGH (ref 6–20)
Calcium, Ion: 1.05 mmol/L — ABNORMAL LOW (ref 1.15–1.40)
Chloride: 104 mmol/L (ref 101–111)
Creatinine, Ser: 1.4 mg/dL — ABNORMAL HIGH (ref 0.44–1.00)
GLUCOSE: 103 mg/dL — AB (ref 65–99)
HEMATOCRIT: 40 % (ref 36.0–46.0)
HEMOGLOBIN: 13.6 g/dL (ref 12.0–15.0)
POTASSIUM: 3.7 mmol/L (ref 3.5–5.1)
Sodium: 143 mmol/L (ref 135–145)
TCO2: 28 mmol/L (ref 22–32)

## 2017-02-20 SURGERY — CARDIOVERSION
Anesthesia: General

## 2017-02-20 MED ORDER — SODIUM CHLORIDE 0.9 % IV SOLN
INTRAVENOUS | Status: DC
  Administered 2017-02-20: 13:00:00 via INTRAVENOUS

## 2017-02-20 MED ORDER — LIDOCAINE HCL (CARDIAC) 20 MG/ML IV SOLN
INTRAVENOUS | Status: DC | PRN
  Administered 2017-02-20: 60 mg via INTRAVENOUS

## 2017-02-20 MED ORDER — PROPOFOL 10 MG/ML IV BOLUS
INTRAVENOUS | Status: DC | PRN
  Administered 2017-02-20: 60 mg via INTRAVENOUS

## 2017-02-20 NOTE — H&P (Signed)
   INTERVAL PROCEDURE H&P  History and Physical Interval Note:  02/20/2017 1:42 PM  Allison Norman has presented today for their planned procedure. The various methods of treatment have been discussed with the patient and family. After consideration of risks, benefits and other options for treatment, the patient has consented to the procedure.  The patients' outpatient history has been reviewed, patient examined, and no change in status from most recent office note within the past 30 days. I have reviewed the patients' chart and labs and will proceed as planned. Questions were answered to the patient's satisfaction.   Pixie Casino, MD, [facility], [place] Director of the Advanced Lipid Disorders &  Cardiovascular Risk Reduction Clinic Diplomate of the American Board of Clinical Lipidology Attending Cardiologist  Direct Dial: [phone]  Fax: [phone]  Website:  [email or web address] 02/20/2017, 1:42 PM

## 2017-02-20 NOTE — Anesthesia Procedure Notes (Signed)
Procedure Name: MAC Date/Time: 02/20/2017 1:57 PM Performed by: Lieutenant Diego, CRNA Pre-anesthesia Checklist: Patient identified, Emergency Drugs available, Suction available, Patient being monitored and Timeout performed Patient Re-evaluated:Patient Re-evaluated prior to induction Oxygen Delivery Method: Circle system utilized Preoxygenation: Pre-oxygenation with 100% oxygen Induction Type: IV induction

## 2017-02-20 NOTE — Anesthesia Preprocedure Evaluation (Signed)
Anesthesia Evaluation  Patient identified by MRN, date of birth, ID band Patient awake    Reviewed: Allergy & Precautions, NPO status , Patient's Chart, lab work & pertinent test results  Airway Mallampati: II       Dental no notable dental hx.    Pulmonary former smoker,    breath sounds clear to auscultation       Cardiovascular hypertension, + dysrhythmias  Rhythm:Irregular Rate:Normal     Neuro/Psych    GI/Hepatic   Endo/Other    Renal/GU Renal disease     Musculoskeletal  (+) Arthritis ,   Abdominal   Peds  Hematology   Anesthesia Other Findings   Reproductive/Obstetrics                             Anesthesia Physical Anesthesia Plan  ASA: III  Anesthesia Plan: General   Post-op Pain Management:    Induction: Intravenous  PONV Risk Score and Plan: 3 and Treatment may vary due to age or medical condition  Airway Management Planned: Mask  Additional Equipment:   Intra-op Plan:   Post-operative Plan:   Informed Consent: I have reviewed the patients History and Physical, chart, labs and discussed the procedure including the risks, benefits and alternatives for the proposed anesthesia with the patient or authorized representative who has indicated his/her understanding and acceptance.     Plan Discussed with: CRNA  Anesthesia Plan Comments:         Anesthesia Quick Evaluation

## 2017-02-20 NOTE — Transfer of Care (Signed)
Immediate Anesthesia Transfer of Care Note  Patient: Allison Norman  Procedure(s) Performed: CARDIOVERSION (N/A )  Patient Location: Endoscopy Unit  Anesthesia Type:MAC  Level of Consciousness: awake and alert   Airway & Oxygen Therapy: Patient Spontanous Breathing and Patient connected to nasal cannula oxygen  Post-op Assessment: Report given to RN and Post -op Vital signs reviewed and stable  Post vital signs: Reviewed and stable  Last Vitals:  Vitals:   02/20/17 1240  BP: (!) 183/114  Resp: (!) 23  Temp: 37 C  SpO2: 93%    Last Pain:  Vitals:   02/20/17 1240  TempSrc: Oral         Complications: No apparent anesthesia complications

## 2017-02-20 NOTE — Discharge Instructions (Signed)
Electrical Cardioversion, Care After °This sheet gives you information about how to care for yourself after your procedure. Your health care provider may also give you more specific instructions. If you have problems or questions, contact your health care provider. °What can I expect after the procedure? °After the procedure, it is common to have: °· Some redness on the skin where the shocks were given. ° °Follow these instructions at home: °· Do not drive for 24 hours if you were given a medicine to help you relax (sedative). °· Take over-the-counter and prescription medicines only as told by your health care provider. °· Ask your health care provider how to check your pulse. Check it often. °· Rest for 48 hours after the procedure or as told by your health care provider. °· Avoid or limit your caffeine use as told by your health care provider. °Contact a health care provider if: °· You feel like your heart is beating too quickly or your pulse is not regular. °· You have a serious muscle cramp that does not go away. °Get help right away if: °· You have discomfort in your chest. °· You are dizzy or you feel faint. °· You have trouble breathing or you are short of breath. °· Your speech is slurred. °· You have trouble moving an arm or leg on one side of your body. °· Your fingers or toes turn cold or blue. °This information is not intended to replace advice given to you by your health care provider. Make sure you discuss any questions you have with your health care provider. °Document Released: 10/13/2012 Document Revised: 07/27/2015 Document Reviewed: 06/29/2015 °Elsevier Interactive Patient Education © 2018 [employer] ° °

## 2017-02-20 NOTE — CV Procedure (Signed)
   CARDIOVERSION NOTE  Procedure: Electrical Cardioversion Indications:  Atrial Fibrillation  Procedure Details:  Consent: Risks of procedure as well as the alternatives and risks of each were explained to the (patient/caregiver).  Consent for procedure obtained.  Time Out: Verified patient identification, verified procedure, site/side was marked, verified correct patient position, special equipment/implants available, medications/allergies/relevent history reviewed, required imaging and test results available.  Performed  Patient placed on cardiac monitor, pulse oximetry, supplemental oxygen as necessary.  Sedation given: Propofol per anesthesia Pacer pads placed anterior and posterior chest.  Cardioverted 1 time(s).  Cardioverted at 120J biphasic.  Impression: Findings: Post procedure EKG shows: NSR Complications: None Patient did tolerate procedure well.  Plan: 1. Successful DCCV to NSR with a single 120J biphasic shock. 2. Short episode of atrial flutter with 4:1 conduction after stable conversion to sinus, then spontaneously converted back to NSR again. 3. Will continue current doses of medications.  4. ?need for antiarrhythmic therapy - but would defer only if she feels a symptomatic improvement with this cardioversion.  Time Spent Directly with the Patient:  45 minutes   Pixie Casino, MD, [facility], [place] Director of the [facility]  Cardiovascular [facility] Diplomate of the American Board of Clinical Lipidology Attending Cardiologist  Direct Dial: [phone]  Fax: [phone]  Website:  [email or web address] 02/20/2017, 2:07 PM

## 2017-02-20 NOTE — Anesthesia Postprocedure Evaluation (Signed)
Anesthesia Post Note  Patient: Allison Norman  Procedure(s) Performed: CARDIOVERSION (N/A )     Patient location during evaluation: Endoscopy Anesthesia Type: General Level of consciousness: awake and alert Pain management: pain level controlled Vital Signs Assessment: post-procedure vital signs reviewed and stable Respiratory status: spontaneous breathing, nonlabored ventilation, respiratory function stable and patient connected to nasal cannula oxygen Cardiovascular status: blood pressure returned to baseline and stable Postop Assessment: no apparent nausea or vomiting Anesthetic complications: no    Last Vitals:  Vitals:   02/20/17 1410 02/20/17 1420  BP: (!) 107/50 (!) 118/57  Pulse: (!) 58 66  Resp: 16 (!) 22  Temp:    SpO2: 92% 92%    Last Pain:  Vitals:   02/20/17 1355  TempSrc: Oral                 Hilliary Jock,JAMES TERRILL

## 2017-02-23 ENCOUNTER — Encounter (HOSPITAL_COMMUNITY): Payer: Self-pay | Admitting: Internal Medicine

## 2017-02-26 ENCOUNTER — Encounter (HOSPITAL_COMMUNITY): Payer: Self-pay | Admitting: Nurse Practitioner

## 2017-02-26 ENCOUNTER — Ambulatory Visit (HOSPITAL_COMMUNITY)
Admission: RE | Admit: 2017-02-26 | Discharge: 2017-02-26 | Disposition: A | Discharge status: Home / Self Care | Payer: Medicare Other | Source: Ambulatory Visit | Attending: Nurse Practitioner | Admitting: Nurse Practitioner

## 2017-02-26 VITALS — BP 146/68 | HR 56 | Ht 66.0 in | Wt 154.0 lb

## 2017-02-26 DIAGNOSIS — Z7901 Long term (current) use of anticoagulants: Secondary | ICD-10-CM | POA: Insufficient documentation

## 2017-02-26 DIAGNOSIS — I4819 Other persistent atrial fibrillation: Secondary | ICD-10-CM

## 2017-02-26 DIAGNOSIS — I480 Paroxysmal atrial fibrillation: Secondary | ICD-10-CM

## 2017-02-26 DIAGNOSIS — N189 Chronic kidney disease, unspecified: Secondary | ICD-10-CM | POA: Insufficient documentation

## 2017-02-26 DIAGNOSIS — Z885 Allergy status to narcotic agent status: Secondary | ICD-10-CM | POA: Diagnosis not present

## 2017-02-26 DIAGNOSIS — I129 Hypertensive chronic kidney disease with stage 1 through stage 4 chronic kidney disease, or unspecified chronic kidney disease: Secondary | ICD-10-CM | POA: Diagnosis not present

## 2017-02-26 DIAGNOSIS — Z880 Allergy status to penicillin: Secondary | ICD-10-CM | POA: Insufficient documentation

## 2017-02-26 DIAGNOSIS — Z87891 Personal history of nicotine dependence: Secondary | ICD-10-CM | POA: Insufficient documentation

## 2017-02-26 DIAGNOSIS — Z79899 Other long term (current) drug therapy: Secondary | ICD-10-CM | POA: Diagnosis not present

## 2017-02-26 DIAGNOSIS — I481 Persistent atrial fibrillation: Secondary | ICD-10-CM | POA: Diagnosis not present

## 2017-02-26 DIAGNOSIS — Z853 Personal history of malignant neoplasm of breast: Secondary | ICD-10-CM | POA: Insufficient documentation

## 2017-02-26 DIAGNOSIS — Z85828 Personal history of other malignant neoplasm of skin: Secondary | ICD-10-CM | POA: Insufficient documentation

## 2017-02-26 DIAGNOSIS — M353 Polymyalgia rheumatica: Secondary | ICD-10-CM | POA: Insufficient documentation

## 2017-02-26 NOTE — Progress Notes (Signed)
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 female, appearing younger than stated age,with a h/o paroxysmal afib, dx fall of 2017, with successful cardioversion, but has had increase in  afib burden  since August. She was in [place] in November when seen by Dr. Debara Pickett. She is in the afib clinic to discuss options to treat.. Pt states that she has been under a lot of stress due to her 74 yo husband with progressive dementia and Hospice is now in the home. She feel that she has been in persistent afib since December.  She is busy during the day and does not notice her afib that much. She does notice it for the first several minutes trying to get to sleep. Does not notice any undo shortness of breath with exertion or fatigue. She is on apixaban correctly dosed at 2.5 mg bid for a chadsvasc score of 4. She noted increase in HR around 1/9 and was advised to go to the ER, waited 6 hours and had not been seen so left. She feels this elevated HR was due to stress over husband.  When seen in the afib clinic one week ago, we discussed options to manage her afib. She does not sound that symptomatic. BB was increased and she is for f/u. Her BP has been stable on higher dose of BB. HR's for the most part have her reasonably rate controlled. Offered cardioversion or amiodarone but she defers at this time.  F/u in afib clinic, she is now on cardizem 240 mg a day, dose increased last week when she felt her HR was still running too high. HR in office  now in the 70's. She has thought about options and would like to try a cardioversion. Last one held her almost a year. No missed does of eliquis.  F/u in afib clinic, she had successful cardioversion and continues in SR. She did have one short lived episode of  4:1 atrial flutter after DCCV but then went back into SR. She did not get her usual energy back right after cardioversion this time but is feeling some  better.   Today, she denies symptoms of palpitations, chest pain, shortness of breath, orthopnea, PND, lower extremity edema, dizziness, presyncope, syncope, or neurologic sequela. The patient is tolerating medications without difficulties and is otherwise without complaint today.   Past Medical History:  Diagnosis Date  . Atrial fibrillation ([place]) 2017   a. s/p DCCV in 10/2015  b. recurrent in 08/2016 --> rate-control pursued.   . Cancer ([place])    Breast  . CKD (chronic kidney disease)   . GCA (giant cell arteritis) ([place])   . Hypertension   . Macular degeneration   . Osteoporosis   . PMR (polymyalgia rheumatica) ([facility])   . Skin cancer    Past Surgical History:  Procedure Laterality Date  . CARDIOVERSION N/A 10/18/2015   Procedure: CARDIOVERSION;  Surgeon: Jerline Pain, MD;  Location: [facility] ENDOSCOPY;  Service: Cardiovascular;  Laterality: N/A;  . CARDIOVERSION N/A 02/20/2017   Procedure: CARDIOVERSION;  Surgeon: Pixie Casino, MD;  Location: [facility];  Service: Cardiovascular;  Laterality: N/A;  . KNEE SURGERY  2013  . MASTECTOMY  1998   left side    Current Outpatient Medications  Medication Sig Dispense Refill  . apixaban (ELIQUIS) 2.5 MG TABS tablet Take 1 tablet (2.5 mg total) by mouth 2 (two) times daily. 60 tablet 5  .  Bevacizumab (AVASTIN IV) Eye injection every 6 weeks    . cloNIDine (CATAPRES) 0.2 MG tablet Take 1 tablet (0.2 mg total) by mouth 3 (three) times daily. 270 tablet 1  . diazepam (VALIUM) 2 MG tablet Take 1-2 mg by mouth at bedtime as needed (sleep).   0  . diltiazem (CARDIZEM CD) 240 MG 24 hr capsule Take 240 mg by mouth daily.    [name] diltiazem (CARDIZEM) 30 MG tablet Take 1 tablet every 4 hours AS NEEDED for AFIB heart rate over 100 45 tablet 1  . furosemide (LASIX) 80 MG tablet Take 0.5 tablets (40 mg total) by mouth daily. Per PCP    . losartan (COZAAR) 100 MG tablet Take 100 mg by mouth daily. Per PCP     . metoprolol succinate (TOPROL-XL) 50 MG 24 hr  tablet Take 1 tablet (50 mg total) by mouth 2 (two) times daily. Take with or immediately following a meal. 180 tablet 1  . Multiple Vitamins-Minerals (PRESERVISION AREDS 2) CAPS Take 1 capsule by mouth daily.    [name] pyridoxine (B-6) 100 MG tablet Take 100 mg by mouth daily.     No current facility-administered medications for this encounter.     Allergies  Allergen Reactions  . Codeine Itching  . Penicillins Itching and Rash    Has patient had a PCN reaction causing immediate rash, facial/tongue/throat swelling, SOB or lightheadedness with hypotension: Yes Has patient had a PCN reaction causing severe rash involving mucus membranes or skin necrosis: No Has patient had a PCN reaction that required hospitalization: No Has patient had a PCN reaction occurring within the last 10 years: No If all of the above answers are "NO", then may proceed with Cephalosporin use.    Social History   Socioeconomic History  . Marital status: Married    Spouse name: Not on file  . Number of children: 2  . Years of education: Not on file  . Highest education level: Not on file  Social Needs  . Financial resource strain: Not on file  . Food insecurity - worry: Not on file  . Food insecurity - inability: Not on file  . Transportation needs - medical: Not on file  . Transportation needs - non-medical: Not on file  Occupational History  . Not on file  Tobacco Use  . Smoking status: Former Smoker    Last attempt to quit: 08/31/1966    Years since quitting: 50.5  . Smokeless tobacco: Never Used  Substance and Sexual Activity  . Alcohol use: No  . Drug use: No  . Sexual activity: Not on file  Other Topics Concern  . Not on file  Social History Narrative   epworth sleepiness scale = 8 (08/31/15)    Family History  Problem Relation Age of Onset  . Stroke Mother   . Kidney failure Father        died at 4  . Heart disease Sister        died at 72  . Heart disease Sister        died at 47  .  Breast cancer Daughter     ROS- All systems are reviewed and negative except as per the HPI above  Physical Exam: Vitals:   02/26/17 1401  BP: (!) 146/68  Pulse: (!) 56  Weight: 154 lb (69.9 kg)  Height: 5\' 6"  (1.676 m)   Wt Readings from Last 3 Encounters:  02/26/17 154 lb (69.9 kg)  02/20/17 150 lb (68 kg)  02/10/17 154 lb (69.9 kg)    Labs: Lab Results  Component Value Date   NA 143 02/20/2017   K 3.7 02/20/2017   CL 104 02/20/2017   CO2 23 02/10/2017   GLUCOSE 103 (H) 02/20/2017   BUN 25 (H) 02/20/2017   CREATININE 1.40 (H) 02/20/2017   CALCIUM 9.0 02/10/2017   MG 2.2 08/18/2016   Lab Results  Component Value Date   INR 1.1 10/11/2015   Lab Results  Component Value Date   CHOL  07/04/2009    81        ATP III CLASSIFICATION:  <200     mg/dL   Desirable  200-239  mg/dL   Borderline High  >=240    mg/dL   High          HDL 18 (L) 07/04/2009   LDLCALC  07/04/2009    50        Total Cholesterol/HDL:CHD Risk Coronary Heart Disease Risk Table                     Men   Women  1/2 Average Risk   3.4   3.3  Average Risk       5.0   4.4  2 X Average Risk   9.6   7.1  3 X Average Risk  23.4   11.0        Use the calculated Patient Ratio above and the CHD Risk Table to determine the patient's CHD Risk.        ATP III CLASSIFICATION (LDL):  <100     mg/dL   Optimal  100-129  mg/dL   Near or Above                    Optimal  130-159  mg/dL   Borderline  160-189  mg/dL   High  >190     mg/dL   Very High   TRIG 64 07/04/2009     GEN- The patient is well appearing, alert and oriented x 3 today.   Head- normocephalic, atraumatic Eyes-  Sclera clear, conjunctiva pink Ears- hearing intact Oropharynx- clear Neck- supple, no JVP Lymph- no cervical lymphadenopathy Lungs- Clear to ausculation bilaterally, normal work of breathing Heart-regular rate and rhythm, no murmurs, rubs or gallops, PMI not laterally displaced GI- soft, NT, ND, + BS Extremities- no  clubbing, cyanosis, or edema MS- no significant deformity or atrophy Skin- no rash or lesion Psych- euthymic mood, full affect Neuro- strength and sensation are intact  EKG-Sinus brady  at 56 bpm,with first degree AV block, pr int 56 bpm, PR int 238 ms, qtc 422 ms    Assessment and Plan: 1. Persistent  afib Appeared  to be in since late December Underwent successful cardioversion but is till slightly fatigued Continue cardizem 240 mg a day Contiue metoprolol 75 mg bid I believe her current HR and BP look ok on current doses of CCB/BB She has 30 mg Cardizem to have on hand if needed for spikes in HR She will continue apixaban 2.5 mg bid for chadsvasc score of 4, no missed doses  F/u here in 3 months at pt request and then with Dr. Debara Pickett in July  Butch Penny C. Jarrett [place]ine, [facility] [address] [place], [place] [address] [phone]

## 2017-02-27 ENCOUNTER — Encounter (HOSPITAL_COMMUNITY): Payer: Self-pay | Admitting: Internal Medicine

## 2017-03-17 DIAGNOSIS — H35052 Retinal neovascularization, unspecified, left eye: Secondary | ICD-10-CM | POA: Diagnosis not present

## 2017-03-17 DIAGNOSIS — H35371 Puckering of macula, right eye: Secondary | ICD-10-CM | POA: Diagnosis not present

## 2017-03-17 DIAGNOSIS — H353222 Exudative age-related macular degeneration, left eye, with inactive choroidal neovascularization: Secondary | ICD-10-CM | POA: Diagnosis not present

## 2017-03-17 DIAGNOSIS — H353211 Exudative age-related macular degeneration, right eye, with active choroidal neovascularization: Secondary | ICD-10-CM | POA: Diagnosis not present

## 2017-03-17 DIAGNOSIS — H353112 Nonexudative age-related macular degeneration, right eye, intermediate dry stage: Secondary | ICD-10-CM | POA: Diagnosis not present

## 2017-03-26 ENCOUNTER — Telehealth (HOSPITAL_COMMUNITY): Payer: Self-pay | Admitting: *Deleted

## 2017-03-26 NOTE — Telephone Encounter (Signed)
Patient called in stating she feels to be back in afib. Yesterday and the day before she checked her HR and it was reading 150 - she took a PRN cardizem and her rates returned to the 70s. Pt doesn't feel much different but it concerns her that she could not tell her HR was elevated. Offered appt to further discuss possibility of amiodarone or she could monitor her HRs over the next several days and touch base - pt prefers to watch HRs and contact us next week with her readings.

## 2017-04-14 ENCOUNTER — Telehealth (HOSPITAL_COMMUNITY): Payer: Self-pay | Admitting: *Deleted

## 2017-04-14 NOTE — Telephone Encounter (Signed)
Pt cld reporting fluctuating HR and BP.  HR 127 and BP 170/116, she has taken her as needed diltiazem and her BP has continued to be elevated but the HR has dropped to 80s.  Pt scheduled for afib clinic 04/24/17.

## 2017-04-15 ENCOUNTER — Ambulatory Visit (HOSPITAL_COMMUNITY)
Admission: RE | Admit: 2017-04-15 | Discharge: 2017-04-15 | Disposition: A | Discharge status: Home / Self Care | Payer: Medicare Other | Source: Ambulatory Visit | Attending: Nurse Practitioner | Admitting: Nurse Practitioner

## 2017-04-15 ENCOUNTER — Encounter (HOSPITAL_COMMUNITY): Payer: Self-pay | Admitting: Nurse Practitioner

## 2017-04-15 VITALS — BP 156/84 | HR 104 | Ht 66.0 in | Wt 150.2 lb

## 2017-04-15 DIAGNOSIS — Z79899 Other long term (current) drug therapy: Secondary | ICD-10-CM | POA: Insufficient documentation

## 2017-04-15 DIAGNOSIS — I481 Persistent atrial fibrillation: Secondary | ICD-10-CM

## 2017-04-15 DIAGNOSIS — Z841 Family history of disorders of kidney and ureter: Secondary | ICD-10-CM | POA: Insufficient documentation

## 2017-04-15 DIAGNOSIS — Z803 Family history of malignant neoplasm of breast: Secondary | ICD-10-CM | POA: Diagnosis not present

## 2017-04-15 DIAGNOSIS — Z85828 Personal history of other malignant neoplasm of skin: Secondary | ICD-10-CM | POA: Diagnosis not present

## 2017-04-15 DIAGNOSIS — Z885 Allergy status to narcotic agent status: Secondary | ICD-10-CM | POA: Diagnosis not present

## 2017-04-15 DIAGNOSIS — Z9889 Other specified postprocedural states: Secondary | ICD-10-CM | POA: Diagnosis not present

## 2017-04-15 DIAGNOSIS — N189 Chronic kidney disease, unspecified: Secondary | ICD-10-CM | POA: Insufficient documentation

## 2017-04-15 DIAGNOSIS — Z7902 Long term (current) use of antithrombotics/antiplatelets: Secondary | ICD-10-CM | POA: Insufficient documentation

## 2017-04-15 DIAGNOSIS — Z853 Personal history of malignant neoplasm of breast: Secondary | ICD-10-CM | POA: Diagnosis not present

## 2017-04-15 DIAGNOSIS — Z87891 Personal history of nicotine dependence: Secondary | ICD-10-CM | POA: Diagnosis not present

## 2017-04-15 DIAGNOSIS — I129 Hypertensive chronic kidney disease with stage 1 through stage 4 chronic kidney disease, or unspecified chronic kidney disease: Secondary | ICD-10-CM | POA: Insufficient documentation

## 2017-04-15 DIAGNOSIS — Z823 Family history of stroke: Secondary | ICD-10-CM | POA: Diagnosis not present

## 2017-04-15 DIAGNOSIS — Z880 Allergy status to penicillin: Secondary | ICD-10-CM | POA: Insufficient documentation

## 2017-04-15 DIAGNOSIS — I480 Paroxysmal atrial fibrillation: Secondary | ICD-10-CM | POA: Insufficient documentation

## 2017-04-15 DIAGNOSIS — M810 Age-related osteoporosis without current pathological fracture: Secondary | ICD-10-CM | POA: Insufficient documentation

## 2017-04-15 DIAGNOSIS — M353 Polymyalgia rheumatica: Secondary | ICD-10-CM | POA: Insufficient documentation

## 2017-04-15 DIAGNOSIS — Z8249 Family history of ischemic heart disease and other diseases of the circulatory system: Secondary | ICD-10-CM | POA: Insufficient documentation

## 2017-04-15 DIAGNOSIS — Z9012 Acquired absence of left breast and nipple: Secondary | ICD-10-CM | POA: Diagnosis not present

## 2017-04-15 DIAGNOSIS — H3530 Unspecified macular degeneration: Secondary | ICD-10-CM | POA: Diagnosis not present

## 2017-04-15 DIAGNOSIS — I4819 Other persistent atrial fibrillation: Secondary | ICD-10-CM

## 2017-04-15 LAB — TSH: TSH: 1.56 u[IU]/mL (ref 0.350–4.500)

## 2017-04-15 LAB — COMPREHENSIVE METABOLIC PANEL
ALK PHOS: 85 U/L (ref 38–126)
ALT: 19 U/L (ref 14–54)
ANION GAP: 12 (ref 5–15)
AST: 25 U/L (ref 15–41)
Albumin: 4 g/dL (ref 3.5–5.0)
BILIRUBIN TOTAL: 0.9 mg/dL (ref 0.3–1.2)
BUN: 27 mg/dL — ABNORMAL HIGH (ref 6–20)
CO2: 27 mmol/L (ref 22–32)
CREATININE: 1.68 mg/dL — AB (ref 0.44–1.00)
Calcium: 9.1 mg/dL (ref 8.9–10.3)
Chloride: 101 mmol/L (ref 101–111)
GFR, EST AFRICAN AMERICAN: 30 mL/min — AB (ref >60)
GFR, EST NON AFRICAN AMERICAN: 26 mL/min — AB (ref >60)
Glucose, Bld: 109 mg/dL — ABNORMAL HIGH (ref 65–99)
Potassium: 3.6 mmol/L (ref 3.5–5.1)
SODIUM: 140 mmol/L (ref 135–145)
TOTAL PROTEIN: 6.9 g/dL (ref 6.5–8.1)

## 2017-04-15 MED ORDER — AMIODARONE HCL 200 MG PO TABS: 200.0000 mg | ORAL_TABLET | Freq: Two times a day (BID) | ORAL | 0 refills | Status: DC

## 2017-04-15 NOTE — Patient Instructions (Addendum)
Start Amiodarone 200mg  twice a day (with food)  Pulmonary testing -  No smoking, caffeine or inhaler

## 2017-04-16 ENCOUNTER — Ambulatory Visit (HOSPITAL_COMMUNITY): Payer: Medicare Other | Admitting: Nurse Practitioner

## 2017-04-16 NOTE — Progress Notes (Signed)
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 female, appearing younger than stated age,with a h/o paroxysmal afib, dx fall of 2017, with successful cardioversion, but has had increase in  afib burden  since August. She was in [place] in November when seen by Dr. Debara Pickett. She is in the afib clinic to discuss options to treat.. Pt states that she has been under a lot of stress due to her 5 yo husband with progressive dementia and Hospice is now in the home. She feel that she has been in persistent afib since December.  She is busy during the day and does not notice her afib that much. She does notice it for the first several minutes trying to get to sleep. Does not notice any undo shortness of breath with exertion or fatigue. She is on apixaban correctly dosed at 2.5 mg bid for a chadsvasc score of 4. She noted increase in HR around 1/9 and was advised to go to the ER, waited 6 hours and had not been seen so left. She feels this elevated HR was due to stress over husband.  When seen in the afib clinic one week ago, we discussed options to manage her afib. She does not sound that symptomatic. BB was increased and she is for f/u. Her BP has been stable on higher dose of BB. HR's for the most part have her reasonably rate controlled. Offered cardioversion or amiodarone but she defers at this time.  F/u in afib clinic, she is now on cardizem 240 mg a day, dose increased last week when she felt her HR was still running too high. HR in office  now in the 70's. She has thought about options and would like to try a cardioversion. Last one held her almost a year. No missed does of eliquis.  F/u in afib clinic, she had successful cardioversion and continues in SR. She did have one short lived episode of  4:1 atrial flutter after DCCV but then went back into SR. She did not get her usual energy back right after cardioversion this time but is feeling some  better.   F/i in afib clinic, 4/10, unfortunately, pt has returned to afib for the last few weeks and has noted fluctuating HR/ BP readings. At home yesterday reported higher BP readings, in the office today at 156/84. It appears that the pt is getting more symptomatic with her afib and feels fatigued. I discussed with her starting amiodarone to pursue SR and she is in agreement.   Today, she denies symptoms of palpitations, chest pain, shortness of breath, orthopnea, PND, lower extremity edema, dizziness, presyncope, syncope, or neurologic sequela. The patient is tolerating medications without difficulties and is otherwise without complaint today.   Past Medical History:  Diagnosis Date  . Atrial fibrillation ([place]) 2017   a. s/p DCCV in 10/2015  b. recurrent in 08/2016 --> rate-control pursued.   . Cancer ([place])    Breast  . CKD (chronic kidney disease)   . GCA (giant cell arteritis) ([place])   . Hypertension   . Macular degeneration   . Osteoporosis   . PMR (polymyalgia rheumatica) ([facility])   . Skin cancer    Past Surgical History:  Procedure Laterality Date  . CARDIOVERSION N/A 10/18/2015   Procedure: CARDIOVERSION;  Surgeon: Jerline Pain, MD;  Location: [facility];  Service: Cardiovascular;  Laterality: N/A;  . CARDIOVERSION N/A 02/20/2017   Procedure: CARDIOVERSION;  Surgeon: Pixie Casino, MD;  Location: [place];  Service: Cardiovascular;  Laterality: N/A;  . KNEE SURGERY  2013  . MASTECTOMY  1998   left side    Current Outpatient Medications  Medication Sig Dispense Refill  . apixaban (ELIQUIS) 2.5 MG TABS tablet Take 1 tablet (2.5 mg total) by mouth 2 (two) times daily. 60 tablet 5  . Bevacizumab (AVASTIN IV) Eye injection every 6 weeks    . cloNIDine (CATAPRES) 0.2 MG tablet Take 1 tablet (0.2 mg total) by mouth 3 (three) times daily. 270 tablet 1  . diazepam (VALIUM) 2 MG tablet Take 1-2 mg by mouth at bedtime as needed (sleep).   0  . diltiazem (CARDIZEM CD) 240 MG  24 hr capsule Take 240 mg by mouth daily.    [name] diltiazem (CARDIZEM) 30 MG tablet Take 1 tablet every 4 hours AS NEEDED for AFIB heart rate over 100 45 tablet 1  . furosemide (LASIX) 80 MG tablet Take 0.5 tablets (40 mg total) by mouth daily. Per PCP    . losartan (COZAAR) 100 MG tablet Take 100 mg by mouth daily. Per PCP     . metoprolol succinate (TOPROL-XL) 50 MG 24 hr tablet Take 1 tablet (50 mg total) by mouth 2 (two) times daily. Take with or immediately following a meal. 180 tablet 1  . Multiple Vitamins-Minerals (PRESERVISION AREDS 2) CAPS Take 1 capsule by mouth daily.    [name] pyridoxine (B-6) 100 MG tablet Take 100 mg by mouth daily.    [name] amiodarone (PACERONE) 200 MG tablet Take 1 tablet (200 mg total) by mouth 2 (two) times daily. 60 tablet 0   No current facility-administered medications for this encounter.     Allergies  Allergen Reactions  . Codeine Itching  . Penicillins Itching and Rash    Has patient had a PCN reaction causing immediate rash, facial/tongue/throat swelling, SOB or lightheadedness with hypotension: Yes Has patient had a PCN reaction causing severe rash involving mucus membranes or skin necrosis: No Has patient had a PCN reaction that required hospitalization: No Has patient had a PCN reaction occurring within the last 10 years: No If all of the above answers are "NO", then may proceed with Cephalosporin use.    Social History   Socioeconomic History  . Marital status: Married    Spouse name: Not on file  . Number of children: 2  . Years of education: Not on file  . Highest education level: Not on file  Occupational History  . Not on file  Social Needs  . Financial resource strain: Not on file  . Food insecurity:    Worry: Not on file    Inability: Not on file  . Transportation needs:    Medical: Not on file    Non-medical: Not on file  Tobacco Use  . Smoking status: Former Smoker    Last attempt to quit: 08/31/1966    Years since quitting: 50.6    . Smokeless tobacco: Never Used  Substance and Sexual Activity  . Alcohol use: No  . Drug use: No  . Sexual activity: Not on file  Lifestyle  . Physical activity:    Days per week: Not on file    Minutes per session: Not on file  . Stress: Not on file  Relationships  . Social connections:    Talks on phone: Not on file    Gets together: Not on file    Attends religious service: Not on file  Active member of club or organization: Not on file    Attends meetings of clubs or organizations: Not on file    Relationship status: Not on file  . Intimate partner violence:    Fear of current or ex partner: Not on file    Emotionally abused: Not on file    Physically abused: Not on file    Forced sexual activity: Not on file  Other Topics Concern  . Not on file  Social History Narrative   epworth sleepiness scale = 8 (08/31/15)    Family History  Problem Relation Age of Onset  . Stroke Mother   . Kidney failure Father        died at 83  . Heart disease Sister        died at 3  . Heart disease Sister        died at 46  . Breast cancer Daughter     ROS- All systems are reviewed and negative except as per the HPI above  Physical Exam: Vitals:   04/15/17 1036  BP: (!) 156/84  Pulse: (!) 104  Weight: 150 lb 3.2 oz (68.1 kg)  Height: 5\' 6"  (1.676 m)   Wt Readings from Last 3 Encounters:  04/15/17 150 lb 3.2 oz (68.1 kg)  02/26/17 154 lb (69.9 kg)  02/20/17 150 lb (68 kg)    Labs: Lab Results  Component Value Date   NA 140 04/15/2017   K 3.6 04/15/2017   CL 101 04/15/2017   CO2 27 04/15/2017   GLUCOSE 109 (H) 04/15/2017   BUN 27 (H) 04/15/2017   CREATININE 1.68 (H) 04/15/2017   CALCIUM 9.1 04/15/2017   MG 2.2 08/18/2016   Lab Results  Component Value Date   INR 1.1 10/11/2015   Lab Results  Component Value Date   CHOL  07/04/2009    81        ATP III CLASSIFICATION:  <200     mg/dL   Desirable  200-239  mg/dL   Borderline High  >=240    mg/dL    High          HDL 18 (L) 07/04/2009   LDLCALC  07/04/2009    50        Total Cholesterol/HDL:CHD Risk Coronary Heart Disease Risk Table                     Men   Women  1/2 Average Risk   3.4   3.3  Average Risk       5.0   4.4  2 X Average Risk   9.6   7.1  3 X Average Risk  23.4   11.0        Use the calculated Patient Ratio above and the CHD Risk Table to determine the patient's CHD Risk.        ATP III CLASSIFICATION (LDL):  <100     mg/dL   Optimal  100-129  mg/dL   Near or Above                    Optimal  130-159  mg/dL   Borderline  160-189  mg/dL   High  >190     mg/dL   Very High   TRIG 64 07/04/2009     GEN- The patient is well appearing, alert and oriented x 3 today.   Head- normocephalic, atraumatic Eyes-  Sclera clear, conjunctiva pink Ears- hearing intact Oropharynx- clear  Neck- supple, no JVP Lymph- no cervical lymphadenopathy Lungs- Clear to ausculation bilaterally, normal work of breathing Heart-irregular rate and rhythm, no murmurs, rubs or gallops, PMI not laterally displaced GI- soft, NT, ND, + BS Extremities- no clubbing, cyanosis, or edema MS- no significant deformity or atrophy Skin- no rash or lesion Psych- euthymic mood, full affect Neuro- strength and sensation are intact  EKG-afib at 104 bpm, qrs int 86 ms, qtc 407 ms    Assessment and Plan: 1. Persistent  afib Underwent successful cardioversion but has returned to afib We discussed trying to restore SR with  amiodarone and she is in agreement, risk vrs benefit of drug discussed  Will start 200 mg bid x one month with possible cardioversion at that time is she does not self convert Continue cardizem 240 mg a day Contiue metoprolol 50 mg bid She will continue apixaban 2.5 mg bid for chadsvasc score of 4, no missed doses Baseline TSH/cmet, PFT   F/u here in one week for EKG on amiodarone, sooner if any issues  Butch Penny C. [place], [facility] [address] [place], [place] [address] [phone]

## 2017-04-20 ENCOUNTER — Telehealth (HOSPITAL_COMMUNITY): Payer: Self-pay | Admitting: *Deleted

## 2017-04-20 MED ORDER — METOPROLOL SUCCINATE ER 50 MG PO TB24: ORAL_TABLET | ORAL | 1 refills | Status: DC

## 2017-04-20 NOTE — Telephone Encounter (Signed)
Patient called in stating she is having HR in the 40-50s since yesterday afternoon. She just "doesn't feel right" and checked it and noticed the lower HR. Discussed with Butch Penny will decrease metoprolol to 25mg  daily and follow up as scheduled tomorrow. Pt verbalized understanding.

## 2017-04-21 ENCOUNTER — Ambulatory Visit (HOSPITAL_BASED_OUTPATIENT_CLINIC_OR_DEPARTMENT_OTHER)
Admission: RE | Admit: 2017-04-21 | Discharge: 2017-04-21 | Disposition: A | Discharge status: Home / Self Care | Payer: Medicare Other | Source: Ambulatory Visit | Attending: Nurse Practitioner | Admitting: Nurse Practitioner

## 2017-04-21 ENCOUNTER — Ambulatory Visit (HOSPITAL_COMMUNITY)
Admission: RE | Admit: 2017-04-21 | Discharge: 2017-04-21 | Disposition: A | Discharge status: Home / Self Care | Payer: Medicare Other | Source: Ambulatory Visit | Attending: Nurse Practitioner | Admitting: Nurse Practitioner

## 2017-04-21 ENCOUNTER — Encounter (HOSPITAL_COMMUNITY): Payer: Self-pay | Admitting: Nurse Practitioner

## 2017-04-21 ENCOUNTER — Other Ambulatory Visit: Payer: Self-pay

## 2017-04-21 VITALS — BP 162/70 | HR 62 | Ht 66.0 in | Wt 152.0 lb

## 2017-04-21 DIAGNOSIS — I481 Persistent atrial fibrillation: Secondary | ICD-10-CM | POA: Diagnosis not present

## 2017-04-21 DIAGNOSIS — I4819 Other persistent atrial fibrillation: Secondary | ICD-10-CM

## 2017-04-21 LAB — PULMONARY FUNCTION TEST
DL/VA % PRED: 88 %
DL/VA: 4.44 ml/min/mmHg/L
DLCO UNC: 15.07 ml/min/mmHg
DLCO unc % pred: 55 %
FEF 25-75 PRE: 1.47 L/s
FEF2575-%PRED-PRE: 153 %
FEV1-%PRED-PRE: 83 %
FEV1-PRE: 1.46 L
FEV1FVC-%Pred-Pre: 109 %
FEV6-%PRED-PRE: 83 %
FEV6-PRE: 1.85 L
FEV6FVC-%PRED-PRE: 106 %
FVC-%Pred-Pre: 78 %
FVC-Pre: 1.85 L
Pre FEV1/FVC ratio: 79 %
Pre FEV6/FVC Ratio: 100 %
RV % PRED: 130 %
RV: 3.54 L
TLC % pred: 102 %
TLC: 5.46 L

## 2017-04-21 NOTE — Progress Notes (Signed)
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 female, appearing younger than stated age,with a h/o paroxysmal afib, dx fall of 2017, with successful cardioversion, but has had increase in  afib burden  since August. She was in [place] in November when seen by Dr. Debara Pickett. She is in the afib clinic to discuss options to treat.. Pt states that she has been under a lot of stress due to her 39 yo husband with progressive dementia and Hospice is now in the home. She feel that she has been in persistent afib since December.  She is busy during the day and does not notice her afib that much. She does notice it for the first several minutes trying to get to sleep. Does not notice any undo shortness of breath with exertion or fatigue. She is on apixaban correctly dosed at 2.5 mg bid for a chadsvasc score of 4. She noted increase in HR around 1/9 and was advised to go to the ER, waited 6 hours and had not been seen so left. She feels this elevated HR was due to stress over husband.  When seen in the afib clinic one week ago, we discussed options to manage her afib. She does not sound that symptomatic. BB was increased and she is for f/u. Her BP has been stable on higher dose of BB. HR's for the most part have her reasonably rate controlled. Offered cardioversion or amiodarone but she defers at this time.  F/u in afib clinic, she is now on cardizem 240 mg a day, dose increased last week when she felt her HR was still running too high. HR in office  now in the 70's. She has thought about options and would like to try a cardioversion. Last one held her almost a year. No missed does of eliquis.  F/u in afib clinic, she had successful cardioversion and continues in SR. She did have one short lived episode of  4:1 atrial flutter after DCCV but then went back into SR. She did not get her usual energy back right after cardioversion this time but is feeling some  better.   F/i in afib clinic, 4/10, unfortunately, pt has returned to afib for the last few weeks and has noted fluctuating HR/ BP readings. At home yesterday reported higher BP readings, in the office today at 156/84. It appears that the pt is getting more symptomatic with her afib and feels fatigued. I discussed with her starting amiodarone to pursue SR and she is in agreement.   F/u in fib clinic, 4/16 ,after starting on amiodarone 200 mg bid. She called the office yesterday with a HR in the upper 40's. BB dose was reduced to 1/2 tab daily. Today, she feels better and EKG shows SR with pac's in the 60's.  Today, she denies symptoms of palpitations, chest pain, shortness of breath, orthopnea, PND, lower extremity edema, dizziness, presyncope, syncope, or neurologic sequela. The patient is tolerating medications without difficulties and is otherwise without complaint today.   Past Medical History:  Diagnosis Date  . Atrial fibrillation ([place]) 2017   a. s/p DCCV in 10/2015  b. recurrent in 08/2016 --> rate-control pursued.   . Cancer ([place])    Breast  . CKD (chronic kidney disease)   . GCA (giant cell arteritis) ([place])   . Hypertension   . Macular degeneration   . Osteoporosis   . PMR (polymyalgia rheumatica) ([facility])   .  Skin cancer    Past Surgical History:  Procedure Laterality Date  . CARDIOVERSION N/A 10/18/2015   Procedure: CARDIOVERSION;  Surgeon: Jerline Pain, MD;  [facility]: [facility] ENDOSCOPY;  Service: Cardiovascular;  Laterality: N/A;  . CARDIOVERSION N/A 02/20/2017   Procedure: CARDIOVERSION;  Surgeon: Pixie Casino, MD;  Location: [facility];  Service: Cardiovascular;  Laterality: N/A;  . KNEE SURGERY  2013  . MASTECTOMY  1998   left side    Current Outpatient Medications  Medication Sig Dispense Refill  . amiodarone (PACERONE) 200 MG tablet Take 1 tablet (200 mg total) by mouth 2 (two) times daily. 60 tablet 0  . apixaban (ELIQUIS) 2.5 MG TABS tablet Take 1 tablet (2.5 mg  total) by mouth 2 (two) times daily. 60 tablet 5  . Bevacizumab (AVASTIN IV) Eye injection every 6 weeks    . cloNIDine (CATAPRES) 0.2 MG tablet Take 1 tablet (0.2 mg total) by mouth 3 (three) times daily. 270 tablet 1  . diazepam (VALIUM) 2 MG tablet Take 1-2 mg by mouth at bedtime as needed (sleep).   0  . diltiazem (CARDIZEM CD) 240 MG 24 hr capsule Take 240 mg by mouth daily.    [name] diltiazem (CARDIZEM) 30 MG tablet Take 1 tablet every 4 hours AS NEEDED for AFIB heart rate over 100 45 tablet 1  . furosemide (LASIX) 80 MG tablet Take 0.5 tablets (40 mg total) by mouth daily. Per PCP    . losartan (COZAAR) 100 MG tablet Take 100 mg by mouth daily. Per PCP     . metoprolol succinate (TOPROL-XL) 50 MG 24 hr tablet Take 1/2 tablet by mouth at bedtime.Take with or immediately following a meal. 180 tablet 1  . Multiple Vitamins-Minerals (PRESERVISION AREDS 2) CAPS Take 1 capsule by mouth daily.    [name] pyridoxine (B-6) 100 MG tablet Take 100 mg by mouth daily.     No current facility-administered medications for this encounter.     Allergies  Allergen Reactions  . Codeine Itching  . Penicillins Itching and Rash    Has patient had a PCN reaction causing immediate rash, facial/tongue/throat swelling, SOB or lightheadedness with hypotension: Yes Has patient had a PCN reaction causing severe rash involving mucus membranes or skin necrosis: No Has patient had a PCN reaction that required hospitalization: No Has patient had a PCN reaction occurring within the last 10 years: No If all of the above answers are "NO", then may proceed with Cephalosporin use.    Social History   Socioeconomic History  . Marital status: Married    Spouse name: Not on file  . Number of children: 2  . Years of education: Not on file  . Highest education level: Not on file  Occupational History  . Not on file  Social Needs  . Financial resource strain: Not on file  . Food insecurity:    Worry: Not on file     Inability: Not on file  . Transportation needs:    Medical: Not on file    Non-medical: Not on file  Tobacco Use  . Smoking status: Former Smoker    Last attempt to quit: 08/31/1966    Years since quitting: 50.6  . Smokeless tobacco: Never Used  Substance and Sexual Activity  . Alcohol use: No  . Drug use: No  . Sexual activity: Not on file  Lifestyle  . Physical activity:    Days per week: Not on file    Minutes per session: Not on  file  . Stress: Not on file  Relationships  . Social connections:    Talks on phone: Not on file    Gets together: Not on file    Attends religious service: Not on file    Active member of club or organization: Not on file    Attends meetings of clubs or organizations: Not on file    Relationship status: Not on file  . Intimate partner violence:    Fear of current or ex partner: Not on file    Emotionally abused: Not on file    Physically abused: Not on file    Forced sexual activity: Not on file  Other Topics Concern  . Not on file  Social History Narrative   epworth sleepiness scale = 8 (08/31/15)    Family History  Problem Relation Age of Onset  . Stroke Mother   . Kidney failure Father        died at 47  . Heart disease Sister        died at 51  . Heart disease Sister        died at 16  . Breast cancer Daughter     ROS- All systems are reviewed and negative except as per the HPI above  Physical Exam: Vitals:   04/21/17 1335  BP: (!) 162/70  Pulse: 62  Weight: 152 lb (68.9 kg)  Height: 5\' 6"  (1.676 m)   Wt Readings from Last 3 Encounters:  04/21/17 152 lb (68.9 kg)  04/15/17 150 lb 3.2 oz (68.1 kg)  02/26/17 154 lb (69.9 kg)    Labs: Lab Results  Component Value Date   NA 140 04/15/2017   K 3.6 04/15/2017   CL 101 04/15/2017   CO2 27 04/15/2017   GLUCOSE 109 (H) 04/15/2017   BUN 27 (H) 04/15/2017   CREATININE 1.68 (H) 04/15/2017   CALCIUM 9.1 04/15/2017   MG 2.2 08/18/2016   Lab Results  Component Value Date     INR 1.1 10/11/2015   Lab Results  Component Value Date   CHOL  07/04/2009    81        ATP III CLASSIFICATION:  <200     mg/dL   Desirable  200-239  mg/dL   Borderline High  >=240    mg/dL   High          HDL 18 (L) 07/04/2009   LDLCALC  07/04/2009    50        Total Cholesterol/HDL:CHD Risk Coronary Heart Disease Risk Table                     Men   Women  1/2 Average Risk   3.4   3.3  Average Risk       5.0   4.4  2 X Average Risk   9.6   7.1  3 X Average Risk  23.4   11.0        Use the calculated Patient Ratio above and the CHD Risk Table to determine the patient's CHD Risk.        ATP III CLASSIFICATION (LDL):  <100     mg/dL   Optimal  100-129  mg/dL   Near or Above                    Optimal  130-159  mg/dL   Borderline  160-189  mg/dL   High  >190     mg/dL  Very High   TRIG 64 07/04/2009     GEN- The patient is well appearing, alert and oriented x 3 today.   Head- normocephalic, atraumatic Eyes-  Sclera clear, conjunctiva pink Ears- hearing intact Oropharynx- clear Neck- supple, no JVP Lymph- no cervical lymphadenopathy Lungs- Clear to ausculation bilaterally, normal work of breathing Heart- irregular rate and rhythm, (SR with PAc's) no murmurs, rubs or gallops, PMI not laterally displaced GI- soft, NT, ND, + BS Extremities- no clubbing, cyanosis, or edema MS- no significant deformity or atrophy Skin- no rash or lesion Psych- euthymic mood, full affect Neuro- strength and sensation are intact  EKG- SR with first degree AV block with PAC's, pr int 234 ms, qrs int 82 ms, qtc 422 ms    Assessment and Plan: 1. Persistent  afib Underwent successful cardioversion but has returned to afib Amiodarone 200 mg bid started and pt now with SR with PAc's Toprol was reduced to 1/2 tab a day for brady with return of SR Continue cardizem 240 mg a day Contiue metoprolol  50 mg succinate 1/2 tab bid She will continue apixaban 2.5 mg bid for chadsvasc score of  4, no missed doses Baseline PFt's done today  2. HTN BP around 160 sys with reduction of BB Will watch HR trend over the next week IF HR stablizes may be able to add more BB back on to help BP  F/u her in 2 weeks, amiodarone will be reduced to one a day in a few weeks   Butch Penny C. Manuel Dall, [facility] [address] [place], [place] [address] [phone]

## 2017-05-05 DIAGNOSIS — H35371 Puckering of macula, right eye: Secondary | ICD-10-CM | POA: Diagnosis not present

## 2017-05-05 DIAGNOSIS — H35051 Retinal neovascularization, unspecified, right eye: Secondary | ICD-10-CM | POA: Diagnosis not present

## 2017-05-05 DIAGNOSIS — H353211 Exudative age-related macular degeneration, right eye, with active choroidal neovascularization: Secondary | ICD-10-CM | POA: Diagnosis not present

## 2017-05-06 ENCOUNTER — Ambulatory Visit (HOSPITAL_COMMUNITY)
Admission: RE | Admit: 2017-05-06 | Discharge: 2017-05-06 | Disposition: A | Discharge status: Home / Self Care | Payer: Medicare Other | Source: Ambulatory Visit | Attending: Nurse Practitioner | Admitting: Nurse Practitioner

## 2017-05-06 ENCOUNTER — Encounter (HOSPITAL_COMMUNITY): Payer: Self-pay | Admitting: Nurse Practitioner

## 2017-05-06 VITALS — BP 174/66 | HR 53 | Ht 66.0 in | Wt 150.6 lb

## 2017-05-06 DIAGNOSIS — Z87891 Personal history of nicotine dependence: Secondary | ICD-10-CM | POA: Insufficient documentation

## 2017-05-06 DIAGNOSIS — Z79899 Other long term (current) drug therapy: Secondary | ICD-10-CM | POA: Insufficient documentation

## 2017-05-06 DIAGNOSIS — I129 Hypertensive chronic kidney disease with stage 1 through stage 4 chronic kidney disease, or unspecified chronic kidney disease: Secondary | ICD-10-CM | POA: Diagnosis not present

## 2017-05-06 DIAGNOSIS — I481 Persistent atrial fibrillation: Secondary | ICD-10-CM | POA: Insufficient documentation

## 2017-05-06 DIAGNOSIS — M353 Polymyalgia rheumatica: Secondary | ICD-10-CM | POA: Insufficient documentation

## 2017-05-06 DIAGNOSIS — H3530 Unspecified macular degeneration: Secondary | ICD-10-CM | POA: Diagnosis not present

## 2017-05-06 DIAGNOSIS — I4819 Other persistent atrial fibrillation: Secondary | ICD-10-CM

## 2017-05-06 DIAGNOSIS — Z85828 Personal history of other malignant neoplasm of skin: Secondary | ICD-10-CM | POA: Insufficient documentation

## 2017-05-06 DIAGNOSIS — N189 Chronic kidney disease, unspecified: Secondary | ICD-10-CM | POA: Insufficient documentation

## 2017-05-06 DIAGNOSIS — Z7901 Long term (current) use of anticoagulants: Secondary | ICD-10-CM | POA: Insufficient documentation

## 2017-05-06 MED ORDER — AMIODARONE HCL 200 MG PO TABS: 200.0000 mg | ORAL_TABLET | Freq: Every day | ORAL | 0 refills | Status: DC

## 2017-05-06 NOTE — Addendum Note (Signed)
Encounter addended by: Sherran Needs, NP on: 05/06/2017 2:55 PM  Actions taken: Sign clinical note

## 2017-05-06 NOTE — Patient Instructions (Signed)
Decrease Amiodarone 200mg once a day 

## 2017-05-06 NOTE — Progress Notes (Addendum)
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 female, appearing younger than stated age,with a h/o paroxysmal afib, dx fall of 2017, with successful cardioversion, but has had increase in  afib burden  since August. She was in [place] in November when seen by Dr. Debara Pickett. She is in the afib clinic to discuss options to treat.. Pt states that she has been under a lot of stress due to her 81 yo husband with progressive dementia and Hospice is now in the home. She feel that she has been in persistent afib since December.  She is busy during the day and does not notice her afib that much. She does notice it for the first several minutes trying to get to sleep. Does not notice any undo shortness of breath with exertion or fatigue. She is on apixaban correctly dosed at 2.5 mg bid for a chadsvasc score of 4. She noted increase in HR around 1/9 and was advised to go to the ER, waited 6 hours and had not been seen so left. She feels this elevated HR was due to stress over husband.  When seen in the afib clinic one week ago, we discussed options to manage her afib. She does not sound that symptomatic. BB was increased and she is for f/u. Her BP has been stable on higher dose of BB. HR's for the most part have her reasonably rate controlled. Offered cardioversion or amiodarone but she defers at this time.  F/u in afib clinic, she is now on cardizem 240 mg a day, dose increased last week when she felt her HR was still running too high. HR in office  now in the 70's. She has thought about options and would like to try a cardioversion. Last one held her almost a year. No missed does of eliquis.  F/u in afib clinic, she had successful cardioversion and continues in SR. She did have one short lived episode of  4:1 atrial flutter after DCCV but then went back into SR. She did not get her usual energy back right after cardioversion this time but is feeling some  better.   F/i in afib clinic, 4/10, unfortunately, pt has returned to afib for the last few weeks and has noted fluctuating HR/ BP readings. At home yesterday reported higher BP readings, in the office today at 156/84. It appears that the pt is getting more symptomatic with her afib and feels fatigued. I discussed with her starting amiodarone to pursue SR and she is in agreement.   F/u in fib clinic, 4/16 ,after starting on amiodarone 200 mg bid. She called the office yesterday with a HR in the upper 40's. BB dose was reduced to 1/2 tab daily. Today, she feels better and EKG shows SR with pac's in the 60's.  F/u in afib clinic, she feels improved but still has some slow heart rates but is not symptomatic. She is staying in [place]. Will reduce amiodarone to 200 mg once a day.  Today, she denies symptoms of palpitations, chest pain, shortness of breath, orthopnea, PND, lower extremity edema, dizziness, presyncope, syncope, or neurologic sequela. The patient is tolerating medications without difficulties and is otherwise without complaint today.   Past Medical History:  Diagnosis Date  . Atrial fibrillation ([place]) 2017   a. s/p DCCV in 10/2015  b. recurrent in 08/2016 --> rate-control pursued.   . Cancer ([place])    Breast  . CKD (chronic  kidney disease)   . GCA (giant cell arteritis) ([place])   . Hypertension   . Macular degeneration   . Osteoporosis   . PMR (polymyalgia rheumatica) ([facility])   . Skin cancer    Past Surgical History:  Procedure Laterality Date  . CARDIOVERSION N/A 10/18/2015   Procedure: CARDIOVERSION;  Surgeon: Jerline Pain, MD;  Location: [facility] ENDOSCOPY;  Service: Cardiovascular;  Laterality: N/A;  . CARDIOVERSION N/A 02/20/2017   Procedure: CARDIOVERSION;  Surgeon: Pixie Casino, MD;  Location: [facility];  Service: Cardiovascular;  Laterality: N/A;  . KNEE SURGERY  2013  . MASTECTOMY  1998   left side    Current Outpatient Medications  Medication Sig Dispense Refill  .  amiodarone (PACERONE) 200 MG tablet Take 1 tablet (200 mg total) by mouth daily. 60 tablet 0  . apixaban (ELIQUIS) 2.5 MG TABS tablet Take 1 tablet (2.5 mg total) by mouth 2 (two) times daily. 60 tablet 5  . Bevacizumab (AVASTIN IV) Eye injection every 6 weeks    . cloNIDine (CATAPRES) 0.2 MG tablet Take 1 tablet (0.2 mg total) by mouth 3 (three) times daily. 270 tablet 1  . diazepam (VALIUM) 2 MG tablet Take 1-2 mg by mouth at bedtime as needed (sleep).   0  . diltiazem (CARDIZEM CD) 240 MG 24 hr capsule Take 240 mg by mouth daily.    [name] diltiazem (CARDIZEM) 30 MG tablet Take 1 tablet every 4 hours AS NEEDED for AFIB heart rate over 100 45 tablet 1  . furosemide (LASIX) 80 MG tablet Take 0.5 tablets (40 mg total) by mouth daily. Per PCP    . losartan (COZAAR) 100 MG tablet Take 100 mg by mouth daily. Per PCP     . metoprolol succinate (TOPROL-XL) 50 MG 24 hr tablet Take 1/2 tablet by mouth at bedtime.Take with or immediately following a meal. 180 tablet 1  . Multiple Vitamins-Minerals (PRESERVISION AREDS 2) CAPS Take 1 capsule by mouth daily.    [name] pyridoxine (B-6) 100 MG tablet Take 100 mg by mouth daily.     No current facility-administered medications for this encounter.     Allergies  Allergen Reactions  . Codeine Itching  . Penicillins Itching and Rash    Has patient had a PCN reaction causing immediate rash, facial/tongue/throat swelling, SOB or lightheadedness with hypotension: Yes Has patient had a PCN reaction causing severe rash involving mucus membranes or skin necrosis: No Has patient had a PCN reaction that required hospitalization: No Has patient had a PCN reaction occurring within the last 10 years: No If all of the above answers are "NO", then may proceed with Cephalosporin use.    Social History   Socioeconomic History  . Marital status: Married    Spouse name: Not on file  . Number of children: 2  . Years of education: Not on file  . Highest education level: Not on  file  Occupational History  . Not on file  Social Needs  . Financial resource strain: Not on file  . Food insecurity:    Worry: Not on file    Inability: Not on file  . Transportation needs:    Medical: Not on file    Non-medical: Not on file  Tobacco Use  . Smoking status: Former Smoker    Last attempt to quit: 08/31/1966    Years since quitting: 50.7  . Smokeless tobacco: Never Used  Substance and Sexual Activity  . Alcohol use: No  . Drug use: No  .  Sexual activity: Not on file  Lifestyle  . Physical activity:    Days per week: Not on file    Minutes per session: Not on file  . Stress: Not on file  Relationships  . Social connections:    Talks on phone: Not on file    Gets together: Not on file    Attends religious service: Not on file    Active member of club or organization: Not on file    Attends meetings of clubs or organizations: Not on file    Relationship status: Not on file  . Intimate partner violence:    Fear of current or ex partner: Not on file    Emotionally abused: Not on file    Physically abused: Not on file    Forced sexual activity: Not on file  Other Topics Concern  . Not on file  Social History Narrative   epworth sleepiness scale = 8 (08/31/15)    Family History  Problem Relation Age of Onset  . Stroke Mother   . Kidney failure Father        died at 20  . Heart disease Sister        died at 76  . Heart disease Sister        died at 26  . Breast cancer Daughter     ROS- All systems are reviewed and negative except as per the HPI above  Physical Exam: Vitals:   05/06/17 1407  BP: (!) 174/66  Pulse: (!) 53  Weight: 150 lb 9.6 oz (68.3 kg)  Height: 5\' 6"  (1.676 m)   Wt Readings from Last 3 Encounters:  05/06/17 150 lb 9.6 oz (68.3 kg)  04/21/17 152 lb (68.9 kg)  04/15/17 150 lb 3.2 oz (68.1 kg)    Labs: Lab Results  Component Value Date   NA 140 04/15/2017   K 3.6 04/15/2017   CL 101 04/15/2017   CO2 27 04/15/2017    GLUCOSE 109 (H) 04/15/2017   BUN 27 (H) 04/15/2017   CREATININE 1.68 (H) 04/15/2017   CALCIUM 9.1 04/15/2017   MG 2.2 08/18/2016   Lab Results  Component Value Date   INR 1.1 10/11/2015   Lab Results  Component Value Date   CHOL  07/04/2009    81        ATP III CLASSIFICATION:  <200     mg/dL   Desirable  200-239  mg/dL   Borderline High  >=240    mg/dL   High          HDL 18 (L) 07/04/2009   LDLCALC  07/04/2009    50        Total Cholesterol/HDL:CHD Risk Coronary Heart Disease Risk Table                     Men   Women  1/2 Average Risk   3.4   3.3  Average Risk       5.0   4.4  2 X Average Risk   9.6   7.1  3 X Average Risk  23.4   11.0        Use the calculated Patient Ratio above and the CHD Risk Table to determine the patient's CHD Risk.        ATP III CLASSIFICATION (LDL):  <100     mg/dL   Optimal  100-129  mg/dL   Near or Above  Optimal  130-159  mg/dL   Borderline  160-189  mg/dL   High  >190     mg/dL   Very High   TRIG 64 07/04/2009     GEN- The patient is well appearing, alert and oriented x 3 today.   Head- normocephalic, atraumatic Eyes-  Sclera clear, conjunctiva pink Ears- hearing intact Oropharynx- clear Neck- supple, no JVP Lymph- no cervical lymphadenopathy Lungs- Clear to ausculation bilaterally, normal work of breathing Heart- irregular rate and rhythm, (SR with PAc's) no murmurs, rubs or gallops, PMI not laterally displaced GI- soft, NT, ND, + BS Extremities- no clubbing, cyanosis, or edema MS- no significant deformity or atrophy Skin- no rash or lesion Psych- euthymic mood, full affect Neuro- strength and sensation are intact  EKG- SR with first degree AV block with PAC's, pr int 262 ms, qrs int 90 ms, qtc 427 ms    Assessment and Plan: 1. Persistent  afib Underwent successful cardioversion but has returned to afib Amiodarone 200 mg bid started and pt now with SR with PAC's, v rate in the 50's, will reduce to   200 mg a day Toprol was reduced to 1/2 tab a day (25 mg ) for brady with return of SR Continue cardizem 240 mg a day She will continue apixaban 2.5 mg bid for chadsvasc score of 4, no missed doses   2. HTN BP around 160 sys with reduction of BB Will watch HR trend over the next week Since reducing BB, BP management not as optimal may have to add amlodipine low dose for better BP management  F/u her in 10 days   Butch Penny C. Caroll Cunnington, [facility] [address] [place], [place] [address] [phone]

## 2017-05-08 ENCOUNTER — Other Ambulatory Visit: Payer: Self-pay | Admitting: Internal Medicine

## 2017-05-08 DIAGNOSIS — I4891 Unspecified atrial fibrillation: Secondary | ICD-10-CM

## 2017-05-15 ENCOUNTER — Other Ambulatory Visit (HOSPITAL_COMMUNITY): Payer: Self-pay | Admitting: *Deleted

## 2017-05-15 MED ORDER — AMLODIPINE BESYLATE 2.5 MG PO TABS: 2.5000 mg | ORAL_TABLET | Freq: Every day | ORAL | 3 refills | Status: DC

## 2017-05-18 ENCOUNTER — Ambulatory Visit (HOSPITAL_COMMUNITY): Payer: Medicare Other | Admitting: Nurse Practitioner

## 2017-05-22 DIAGNOSIS — L821 Other seborrheic keratosis: Secondary | ICD-10-CM | POA: Diagnosis not present

## 2017-05-22 DIAGNOSIS — D485 Neoplasm of uncertain behavior of skin: Secondary | ICD-10-CM | POA: Diagnosis not present

## 2017-05-22 DIAGNOSIS — L570 Actinic keratosis: Secondary | ICD-10-CM | POA: Diagnosis not present

## 2017-05-22 DIAGNOSIS — C44712 Basal cell carcinoma of skin of right lower limb, including hip: Secondary | ICD-10-CM | POA: Diagnosis not present

## 2017-05-22 DIAGNOSIS — Z85828 Personal history of other malignant neoplasm of skin: Secondary | ICD-10-CM | POA: Diagnosis not present

## 2017-05-25 ENCOUNTER — Encounter (HOSPITAL_COMMUNITY): Payer: Self-pay | Admitting: Nurse Practitioner

## 2017-05-25 ENCOUNTER — Ambulatory Visit (HOSPITAL_COMMUNITY)
Admission: RE | Admit: 2017-05-25 | Discharge: 2017-05-25 | Disposition: A | Discharge status: Home / Self Care | Payer: Medicare Other | Source: Ambulatory Visit | Attending: Nurse Practitioner | Admitting: Nurse Practitioner

## 2017-05-25 VITALS — BP 142/72 | HR 89 | Ht 66.0 in | Wt 152.0 lb

## 2017-05-25 DIAGNOSIS — Z9889 Other specified postprocedural states: Secondary | ICD-10-CM | POA: Diagnosis not present

## 2017-05-25 DIAGNOSIS — I129 Hypertensive chronic kidney disease with stage 1 through stage 4 chronic kidney disease, or unspecified chronic kidney disease: Secondary | ICD-10-CM | POA: Insufficient documentation

## 2017-05-25 DIAGNOSIS — I4819 Other persistent atrial fibrillation: Secondary | ICD-10-CM

## 2017-05-25 DIAGNOSIS — Z8249 Family history of ischemic heart disease and other diseases of the circulatory system: Secondary | ICD-10-CM | POA: Diagnosis not present

## 2017-05-25 DIAGNOSIS — M353 Polymyalgia rheumatica: Secondary | ICD-10-CM | POA: Diagnosis not present

## 2017-05-25 DIAGNOSIS — Z7901 Long term (current) use of anticoagulants: Secondary | ICD-10-CM | POA: Insufficient documentation

## 2017-05-25 DIAGNOSIS — Z85828 Personal history of other malignant neoplasm of skin: Secondary | ICD-10-CM | POA: Diagnosis not present

## 2017-05-25 DIAGNOSIS — Z9012 Acquired absence of left breast and nipple: Secondary | ICD-10-CM | POA: Insufficient documentation

## 2017-05-25 DIAGNOSIS — I481 Persistent atrial fibrillation: Secondary | ICD-10-CM | POA: Diagnosis not present

## 2017-05-25 DIAGNOSIS — Z803 Family history of malignant neoplasm of breast: Secondary | ICD-10-CM | POA: Insufficient documentation

## 2017-05-25 DIAGNOSIS — Z880 Allergy status to penicillin: Secondary | ICD-10-CM | POA: Insufficient documentation

## 2017-05-25 DIAGNOSIS — Z885 Allergy status to narcotic agent status: Secondary | ICD-10-CM | POA: Insufficient documentation

## 2017-05-25 DIAGNOSIS — Z841 Family history of disorders of kidney and ureter: Secondary | ICD-10-CM | POA: Diagnosis not present

## 2017-05-25 DIAGNOSIS — H3530 Unspecified macular degeneration: Secondary | ICD-10-CM | POA: Insufficient documentation

## 2017-05-25 DIAGNOSIS — Z823 Family history of stroke: Secondary | ICD-10-CM | POA: Insufficient documentation

## 2017-05-25 DIAGNOSIS — I4892 Unspecified atrial flutter: Secondary | ICD-10-CM | POA: Insufficient documentation

## 2017-05-25 DIAGNOSIS — Z79899 Other long term (current) drug therapy: Secondary | ICD-10-CM | POA: Diagnosis not present

## 2017-05-25 DIAGNOSIS — Z87891 Personal history of nicotine dependence: Secondary | ICD-10-CM | POA: Diagnosis not present

## 2017-05-25 NOTE — Progress Notes (Signed)
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 female, appearing younger than stated age,with a h/o paroxysmal afib, dx fall of 2017, with successful cardioversion, but has had increase in  afib burden  since August. She was in [place] in November when seen by Dr. Debara Pickett. She is in the afib clinic to discuss options to treat.. Pt states that she has been under a lot of stress due to her 31 yo husband with progressive dementia and Hospice is now in the home. She feel that she has been in persistent afib since December.  She is busy during the day and does not notice her afib that much. She does notice it for the first several minutes trying to get to sleep. Does not notice any undo shortness of breath with exertion or fatigue. She is on apixaban correctly dosed at 2.5 mg bid for a chadsvasc score of 4. She noted increase in HR around 1/9 and was advised to go to the ER, waited 6 hours and had not been seen so left. She feels this elevated HR was due to stress over husband.  When seen in the afib clinic one week ago, we discussed options to manage her afib. She does not sound that symptomatic. BB was increased and she is for f/u. Her BP has been stable on higher dose of BB. HR's for the most part have her reasonably rate controlled. Offered cardioversion or amiodarone but she defers at this time.  F/u in afib clinic, she is now on cardizem 240 mg a day, dose increased last week when she felt her HR was still running too high. HR in office  now in the 70's. She has thought about options and would like to try a cardioversion. Last one held her almost a year. No missed does of eliquis.  F/u in afib clinic, she had successful cardioversion and continues in SR. She did have one short lived episode of  4:1 atrial flutter after DCCV but then went back into SR. She did not get her usual energy back right after cardioversion this time but is feeling some  better.   F/i in afib clinic, 4/10, unfortunately, pt has returned to afib for the last few weeks and has noted fluctuating HR/ BP readings. At home yesterday reported higher BP readings, in the office today at 156/84. It appears that the pt is getting more symptomatic with her afib and feels fatigued. I discussed with her starting amiodarone to pursue SR and she is in agreement.   F/u in fib clinic, 4/16 ,after starting on amiodarone 200 mg bid. She called the office yesterday with a HR in the upper 40's. BB dose was reduced to 1/2 tab daily. Today, she feels better and EKG shows SR with pac's in the 60's.  F/u in afib clinic, she feels improved but still has some slow heart rates but is not symptomatic. She is staying in [place]. Will reduce amiodarone to 200 mg once a day.  F/u in afib clinic, 5/20, she called in and was reporting some low heart rates so my nurse advised to cut BB in half.  Today, she is in aflutter with controlled v rate. Her husband died this week and from the stress may have caused her to get out of rhythm vrs, she also forgot her amiodarone for 4 days.  Today, she denies symptoms of palpitations, chest pain, shortness of breath, orthopnea, PND, lower extremity  edema, dizziness, presyncope, syncope, or neurologic sequela. The patient is tolerating medications without difficulties and is otherwise without complaint today.   Past Medical History:  Diagnosis Date  . Atrial fibrillation ([place]) 2017   a. s/p DCCV in 10/2015  b. recurrent in 08/2016 --> rate-control pursued.   . Cancer ([place])    Breast  . CKD (chronic kidney disease)   . GCA (giant cell arteritis) ([place])   . Hypertension   . Macular degeneration   . Osteoporosis   . PMR (polymyalgia rheumatica) ([facility])   . Skin cancer    Past Surgical History:  Procedure Laterality Date  . CARDIOVERSION N/A 10/18/2015   Procedure: CARDIOVERSION;  Surgeon: Jerline Pain, MD;  Location: [facility] ENDOSCOPY;  Service: Cardiovascular;   Laterality: N/A;  . CARDIOVERSION N/A 02/20/2017   Procedure: CARDIOVERSION;  Surgeon: Pixie Casino, MD;  Location: [place];  Service: Cardiovascular;  Laterality: N/A;  . KNEE SURGERY  2013  . MASTECTOMY  1998   left side    Current Outpatient Medications  Medication Sig Dispense Refill  . amiodarone (PACERONE) 200 MG tablet Take 1 tablet (200 mg total) by mouth daily. 60 tablet 0  . amLODipine (NORVASC) 2.5 MG tablet Take 1 tablet (2.5 mg total) by mouth daily. 30 tablet 3  . Bevacizumab (AVASTIN IV) Eye injection every 6 weeks    . cloNIDine (CATAPRES) 0.2 MG tablet Take 1 tablet (0.2 mg total) by mouth 3 (three) times daily. 270 tablet 1  . diazepam (VALIUM) 2 MG tablet Take 1-2 mg by mouth at bedtime as needed (sleep).   0  . diltiazem (CARDIZEM CD) 240 MG 24 hr capsule Take 240 mg by mouth daily.    [name] diltiazem (CARDIZEM) 30 MG tablet Take 1 tablet every 4 hours AS NEEDED for AFIB heart rate over 100 45 tablet 1  . ELIQUIS 2.5 MG TABS tablet TAKE 1 TABLET BY MOUTH TWICE DAILY 180 tablet 1  . furosemide (LASIX) 80 MG tablet Take 0.5 tablets (40 mg total) by mouth daily. Per PCP    . losartan (COZAAR) 100 MG tablet Take 100 mg by mouth daily. Per PCP     . metoprolol succinate (TOPROL-XL) 50 MG 24 hr tablet Take 1/2 tablet by mouth at bedtime.Take with or immediately following a meal. 180 tablet 1  . Multiple Vitamins-Minerals (PRESERVISION AREDS 2) CAPS Take 1 capsule by mouth daily.    [name] pyridoxine (B-6) 100 MG tablet Take 100 mg by mouth daily.     No current facility-administered medications for this encounter.     Allergies  Allergen Reactions  . Codeine Itching  . Penicillins Itching and Rash    Has patient had a PCN reaction causing immediate rash, facial/tongue/throat swelling, SOB or lightheadedness with hypotension: Yes Has patient had a PCN reaction causing severe rash involving mucus membranes or skin necrosis: No Has patient had a PCN reaction that required  hospitalization: No Has patient had a PCN reaction occurring within the last 10 years: No If all of the above answers are "NO", then may proceed with Cephalosporin use.    Social History   Socioeconomic History  . Marital status: Married    Spouse name: Not on file  . Number of children: 2  . Years of education: Not on file  . Highest education level: Not on file  Occupational History  . Not on file  Social Needs  . Financial resource strain: Not on file  . Food insecurity:  Worry: Not on file    Inability: Not on file  . Transportation needs:    Medical: Not on file    Non-medical: Not on file  Tobacco Use  . Smoking status: Former Smoker    Last attempt to quit: 08/31/1966    Years since quitting: 50.7  . Smokeless tobacco: Never Used  Substance and Sexual Activity  . Alcohol use: No  . Drug use: No  . Sexual activity: Not on file  Lifestyle  . Physical activity:    Days per week: Not on file    Minutes per session: Not on file  . Stress: Not on file  Relationships  . Social connections:    Talks on phone: Not on file    Gets together: Not on file    Attends religious service: Not on file    Active member of club or organization: Not on file    Attends meetings of clubs or organizations: Not on file    Relationship status: Not on file  . Intimate partner violence:    Fear of current or ex partner: Not on file    Emotionally abused: Not on file    Physically abused: Not on file    Forced sexual activity: Not on file  Other Topics Concern  . Not on file  Social History Narrative   epworth sleepiness scale = 8 (08/31/15)    Family History  Problem Relation Age of Onset  . Stroke Mother   . Kidney failure Father        died at 26  . Heart disease Sister        died at 106  . Heart disease Sister        died at 57  . Breast cancer Daughter     ROS- All systems are reviewed and negative except as per the HPI above  Physical Exam: Vitals:   05/25/17  1432  BP: (!) 142/72  Pulse: 89  Weight: 152 lb (68.9 kg)  Height: 5\' 6"  (1.676 m)   Wt Readings from Last 3 Encounters:  05/25/17 152 lb (68.9 kg)  05/06/17 150 lb 9.6 oz (68.3 kg)  04/21/17 152 lb (68.9 kg)    Labs: Lab Results  Component Value Date   NA 140 04/15/2017   K 3.6 04/15/2017   CL 101 04/15/2017   CO2 27 04/15/2017   GLUCOSE 109 (H) 04/15/2017   BUN 27 (H) 04/15/2017   CREATININE 1.68 (H) 04/15/2017   CALCIUM 9.1 04/15/2017   MG 2.2 08/18/2016   Lab Results  Component Value Date   INR 1.1 10/11/2015   Lab Results  Component Value Date   CHOL  07/04/2009    81        ATP III CLASSIFICATION:  <200     mg/dL   Desirable  200-239  mg/dL   Borderline High  >=240    mg/dL   High          HDL 18 (L) 07/04/2009   LDLCALC  07/04/2009    50        Total Cholesterol/HDL:CHD Risk Coronary Heart Disease Risk Table                     Men   Women  1/2 Average Risk   3.4   3.3  Average Risk       5.0   4.4  2 X Average Risk   9.6   7.1  3 X  Average Risk  23.4   11.0        Use the calculated Patient Ratio above and the CHD Risk Table to determine the patient's CHD Risk.        ATP III CLASSIFICATION (LDL):  <100     mg/dL   Optimal  100-129  mg/dL   Near or Above                    Optimal  130-159  mg/dL   Borderline  160-189  mg/dL   High  >190     mg/dL   Very High   TRIG 64 07/04/2009     GEN- The patient is well appearing, alert and oriented x 3 today.   Head- normocephalic, atraumatic Eyes-  Sclera clear, conjunctiva pink Ears- hearing intact Oropharynx- clear Neck- supple, no JVP Lymph- no cervical lymphadenopathy Lungs- Clear to ausculation bilaterally, normal work of breathing Heart- irregular rate and rhythm, (SR with PAc's) no murmurs, rubs or gallops, PMI not laterally displaced GI- soft, NT, ND, + BS Extremities- no clubbing, cyanosis, or edema MS- no significant deformity or atrophy Skin- no rash or lesion Psych- euthymic mood,  full affect Neuro- strength and sensation are intact  EKG- atrial flutter with variable AV block, v rate 89 bpm, qrs int 82 ms, qtc 457 ms    Assessment and Plan: 1. Persistent  afib Underwent successful loading of amiodarone but now out of rhythm,  possibly 2/2 stress from  loss of husband this past week vrs forgetting amiodarone x 4 days Restart amiodarone 200 mg   Toprol was reduced to 1/2 tab a day (25 mg ) and can leave at this dose Continue cardizem 240 mg a day She will continue apixaban 2.5 mg bid for chadsvasc score of 4, no missed doses   2. HTN  Stable  F/u  In 2 weeks   Geroge Baseman. Gradyn Shein, [facility] [address] [place], [place] [address] [phone]

## 2017-05-26 DIAGNOSIS — Z0000 Encounter for general adult medical examination without abnormal findings: Secondary | ICD-10-CM | POA: Diagnosis not present

## 2017-05-26 DIAGNOSIS — F411 Generalized anxiety disorder: Secondary | ICD-10-CM | POA: Diagnosis not present

## 2017-05-26 DIAGNOSIS — I10 Essential (primary) hypertension: Secondary | ICD-10-CM | POA: Diagnosis not present

## 2017-05-27 ENCOUNTER — Other Ambulatory Visit (HOSPITAL_COMMUNITY): Payer: Self-pay | Admitting: Nurse Practitioner

## 2017-05-28 ENCOUNTER — Ambulatory Visit (HOSPITAL_COMMUNITY): Payer: Medicare Other | Admitting: Nurse Practitioner

## 2017-06-10 ENCOUNTER — Encounter (HOSPITAL_COMMUNITY): Payer: Self-pay | Admitting: Nurse Practitioner

## 2017-06-10 ENCOUNTER — Ambulatory Visit (HOSPITAL_COMMUNITY)
Admission: RE | Admit: 2017-06-10 | Discharge: 2017-06-10 | Disposition: A | Discharge status: Home / Self Care | Payer: Medicare Other | Source: Ambulatory Visit | Attending: Nurse Practitioner | Admitting: Nurse Practitioner

## 2017-06-10 VITALS — BP 138/74 | HR 86 | Ht 66.0 in | Wt 152.0 lb

## 2017-06-10 DIAGNOSIS — I484 Atypical atrial flutter: Secondary | ICD-10-CM

## 2017-06-10 DIAGNOSIS — I129 Hypertensive chronic kidney disease with stage 1 through stage 4 chronic kidney disease, or unspecified chronic kidney disease: Secondary | ICD-10-CM | POA: Insufficient documentation

## 2017-06-10 DIAGNOSIS — N189 Chronic kidney disease, unspecified: Secondary | ICD-10-CM | POA: Diagnosis not present

## 2017-06-10 DIAGNOSIS — H3530 Unspecified macular degeneration: Secondary | ICD-10-CM | POA: Diagnosis not present

## 2017-06-10 DIAGNOSIS — Z7901 Long term (current) use of anticoagulants: Secondary | ICD-10-CM | POA: Insufficient documentation

## 2017-06-10 DIAGNOSIS — Z87891 Personal history of nicotine dependence: Secondary | ICD-10-CM | POA: Diagnosis not present

## 2017-06-10 DIAGNOSIS — I481 Persistent atrial fibrillation: Secondary | ICD-10-CM | POA: Insufficient documentation

## 2017-06-10 DIAGNOSIS — Z79899 Other long term (current) drug therapy: Secondary | ICD-10-CM | POA: Insufficient documentation

## 2017-06-10 DIAGNOSIS — M353 Polymyalgia rheumatica: Secondary | ICD-10-CM | POA: Insufficient documentation

## 2017-06-10 DIAGNOSIS — I4892 Unspecified atrial flutter: Secondary | ICD-10-CM | POA: Diagnosis not present

## 2017-06-10 DIAGNOSIS — Z85828 Personal history of other malignant neoplasm of skin: Secondary | ICD-10-CM | POA: Insufficient documentation

## 2017-06-10 LAB — BASIC METABOLIC PANEL
ANION GAP: 9 (ref 5–15)
BUN: 24 mg/dL — ABNORMAL HIGH (ref 6–20)
CALCIUM: 8.8 mg/dL — AB (ref 8.9–10.3)
CO2: 27 mmol/L (ref 22–32)
Chloride: 99 mmol/L — ABNORMAL LOW (ref 101–111)
Creatinine, Ser: 1.71 mg/dL — ABNORMAL HIGH (ref 0.44–1.00)
GFR, EST AFRICAN AMERICAN: 29 mL/min — AB (ref >60)
GFR, EST NON AFRICAN AMERICAN: 25 mL/min — AB (ref >60)
Glucose, Bld: 141 mg/dL — ABNORMAL HIGH (ref 65–99)
POTASSIUM: 4.4 mmol/L (ref 3.5–5.1)
Sodium: 135 mmol/L (ref 135–145)

## 2017-06-10 LAB — CBC
HCT: 42.8 % (ref 36.0–46.0)
Hemoglobin: 13.3 g/dL (ref 12.0–15.0)
MCH: 30.2 pg (ref 26.0–34.0)
MCHC: 31.1 g/dL (ref 30.0–36.0)
MCV: 97.1 fL (ref 78.0–100.0)
PLATELETS: 271 10*3/uL (ref 150–400)
RBC: 4.41 MIL/uL (ref 3.87–5.11)
RDW: 15.6 % — AB (ref 11.5–15.5)
WBC: 8.5 10*3/uL (ref 4.0–10.5)

## 2017-06-10 NOTE — H&P (View-Only) (Signed)
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 female, appearing younger than stated age,with a h/o paroxysmal afib, dx fall of 2017, with successful cardioversion, but has had increase in  afib burden  since August. She was in [place] in November when seen by Dr. Debara Pickett. She is in the afib clinic to discuss options to treat.. Pt states that she has been under a lot of stress due to her 46 yo husband with progressive dementia and Hospice is now in the home. She feel that she has been in persistent afib since December.  She is busy during the day and does not notice her afib that much. She does notice it for the first several minutes trying to get to sleep. Does not notice any undo shortness of breath with exertion or fatigue. She is on apixaban correctly dosed at 2.5 mg bid for a chadsvasc score of 4. She noted increase in HR around 1/9 and was advised to go to the ER, waited 6 hours and had not been seen so left. She feels this elevated HR was due to stress over husband.  When seen in the afib clinic one week ago, we discussed options to manage her afib. She does not sound that symptomatic. BB was increased and she is for f/u. Her BP has been stable on higher dose of BB. HR's for the most part have her reasonably rate controlled. Offered cardioversion or amiodarone but she defers at this time.  F/u in afib clinic, she is now on cardizem 240 mg a day, dose increased last week when she felt her HR was still running too high. HR in office  now in the 70's. She has thought about options and would like to try a cardioversion. Last one held her almost a year. No missed does of eliquis.  F/u in afib clinic, she had successful cardioversion and continues in SR. She did have one short lived episode of  4:1 atrial flutter after DCCV but then went back into SR. She did not get her usual energy back right after cardioversion this time but is feeling some  better.   F/i in afib clinic, 4/10, unfortunately, pt has returned to afib for the last few weeks and has noted fluctuating HR/ BP readings. At home yesterday reported higher BP readings, in the office today at 156/84. It appears that the pt is getting more symptomatic with her afib and feels fatigued. I discussed with her starting amiodarone to pursue SR and she is in agreement.   F/u in fib clinic, 4/16 ,after starting on amiodarone 200 mg bid. She called the office yesterday with a HR in the upper 40's. BB dose was reduced to 1/2 tab daily. Today, she feels better and EKG shows SR with pac's in the 60's.  F/u in afib clinic, she feels improved but still has some slow heart rates but is not symptomatic. She is staying in [place]. Will reduce amiodarone to 200 mg once a day.  F/u in afib clinic, 5/20, she called in and was reporting some low heart rates so my nurse advised to cut BB in half.  Today, she is in aflutter with controlled v rate. Her husband died this week and from the stress may have caused her to get out of rhythm vrs, she also forgot her amiodarone for 4 days.  F/u in afib clinic, pt continues to be in afib despite increasing amiodarone to 200  mg bid. The recent death of her husband has caused a lot of stress which is probably contributing to persistence of afib Discussed cardioversion and pt is in agreement. No missed doses of eliquis.  Today, she denies symptoms of palpitations, chest pain, shortness of breath, orthopnea, PND, lower extremity edema, dizziness, presyncope, syncope, or neurologic sequela.+ fatigue. The patient is tolerating medications without difficulties and is otherwise without complaint today.   Past Medical History:  Diagnosis Date  . Atrial fibrillation ([place]) 2017   a. s/p DCCV in 10/2015  b. recurrent in 08/2016 --> rate-control pursued.   . Cancer ([place])    Breast  . CKD (chronic kidney disease)   . GCA (giant cell arteritis) ([place])   . Hypertension   .  Macular degeneration   . Osteoporosis   . PMR (polymyalgia rheumatica) ([facility])   . Skin cancer    Past Surgical History:  Procedure Laterality Date  . CARDIOVERSION N/A 10/18/2015   Procedure: CARDIOVERSION;  Surgeon: Jerline Pain, MD;  Location[facility] ENDOSCOPY;  Service: Cardiovascular;  Laterality: N/A;  . CARDIOVERSION N/A 02/20/2017   Procedure: CARDIOVERSION;  Surgeon: Pixie Casino, MD;  Location: [place];  Service: Cardiovascular;  Laterality: N/A;  . KNEE SURGERY  2013  . MASTECTOMY  1998   left side    Current Outpatient Medications  Medication Sig Dispense Refill  . amiodarone (PACERONE) 200 MG tablet Take 1 tablet (200 mg total) by mouth daily. (Patient taking differently: Take 200 mg by mouth 2 (two) times daily. ) 60 tablet 0  . amLODipine (NORVASC) 2.5 MG tablet Take 1 tablet (2.5 mg total) by mouth daily. 30 tablet 3  . Bevacizumab (AVASTIN IV) Eye injection every 6 weeks    . cloNIDine (CATAPRES) 0.2 MG tablet Take 1 tablet (0.2 mg total) by mouth 3 (three) times daily. 270 tablet 1  . diazepam (VALIUM) 2 MG tablet Take 1-2 mg by mouth at bedtime as needed (sleep).   0  . diltiazem (CARDIZEM CD) 240 MG 24 hr capsule Take 240 mg by mouth daily.    [name] diltiazem (CARDIZEM) 30 MG tablet Take 1 tablet every 4 hours AS NEEDED for AFIB heart rate over 100 45 tablet 1  . ELIQUIS 2.5 MG TABS tablet TAKE 1 TABLET BY MOUTH TWICE DAILY 180 tablet 1  . furosemide (LASIX) 80 MG tablet Take 0.5 tablets (40 mg total) by mouth daily. Per PCP    . losartan (COZAAR) 100 MG tablet Take 100 mg by mouth daily. Per PCP     . metoprolol succinate (TOPROL-XL) 50 MG 24 hr tablet Take 1/2 tablet by mouth at bedtime.Take with or immediately following a meal. 180 tablet 1  . Multiple Vitamins-Minerals (PRESERVISION AREDS 2) CAPS Take 1 capsule by mouth daily.    [name] pyridoxine (B-6) 100 MG tablet Take 100 mg by mouth daily.     No current facility-administered medications for this encounter.      Allergies  Allergen Reactions  . Codeine Itching  . Penicillins Itching and Rash    Has patient had a PCN reaction causing immediate rash, facial/tongue/throat swelling, SOB or lightheadedness with hypotension: Yes Has patient had a PCN reaction causing severe rash involving mucus membranes or skin necrosis: No Has patient had a PCN reaction that required hospitalization: No Has patient had a PCN reaction occurring within the last 10 years: No If all of the above answers are "NO", then may proceed with Cephalosporin use.    Social History  Socioeconomic History  . Marital status: Married    Spouse name: Not on file  . Number of children: 2  . Years of education: Not on file  . Highest education level: Not on file  Occupational History  . Not on file  Social Needs  . Financial resource strain: Not on file  . Food insecurity:    Worry: Not on file    Inability: Not on file  . Transportation needs:    Medical: Not on file    Non-medical: Not on file  Tobacco Use  . Smoking status: Former Smoker    Last attempt to quit: 08/31/1966    Years since quitting: 50.8  . Smokeless tobacco: Never Used  Substance and Sexual Activity  . Alcohol use: No  . Drug use: No  . Sexual activity: Not on file  Lifestyle  . Physical activity:    Days per week: Not on file    Minutes per session: Not on file  . Stress: Not on file  Relationships  . Social connections:    Talks on phone: Not on file    Gets together: Not on file    Attends religious service: Not on file    Active member of club or organization: Not on file    Attends meetings of clubs or organizations: Not on file    Relationship status: Not on file  . Intimate partner violence:    Fear of current or ex partner: Not on file    Emotionally abused: Not on file    Physically abused: Not on file    Forced sexual activity: Not on file  Other Topics Concern  . Not on file  Social History Narrative   epworth sleepiness  scale = 8 (08/31/15)    Family History  Problem Relation Age of Onset  . Stroke Mother   . Kidney failure Father        died at 93  . Heart disease Sister        died at 17  . Heart disease Sister        died at 39  . Breast cancer Daughter     ROS- All systems are reviewed and negative except as per the HPI above  Physical Exam: Vitals:   06/10/17 1405  BP: 138/74  Pulse: 86  Weight: 152 lb (68.9 kg)  Height: 5\' 6"  (1.676 m)   Wt Readings from Last 3 Encounters:  06/10/17 152 lb (68.9 kg)  05/25/17 152 lb (68.9 kg)  05/06/17 150 lb 9.6 oz (68.3 kg)    Labs: Lab Results  Component Value Date   NA 140 04/15/2017   K 3.6 04/15/2017   CL 101 04/15/2017   CO2 27 04/15/2017   GLUCOSE 109 (H) 04/15/2017   BUN 27 (H) 04/15/2017   CREATININE 1.68 (H) 04/15/2017   CALCIUM 9.1 04/15/2017   MG 2.2 08/18/2016   Lab Results  Component Value Date   INR 1.1 10/11/2015   Lab Results  Component Value Date   CHOL  07/04/2009    81        ATP III CLASSIFICATION:  <200     mg/dL   Desirable  200-239  mg/dL   Borderline High  >=240    mg/dL   High          HDL 18 (L) 07/04/2009   LDLCALC  07/04/2009    50        Total Cholesterol/HDL:CHD Risk Coronary Heart Disease Risk  Table                     Men   Women  1/2 Average Risk   3.4   3.3  Average Risk       5.0   4.4  2 X Average Risk   9.6   7.1  3 X Average Risk  23.4   11.0        Use the calculated Patient Ratio above and the CHD Risk Table to determine the patient's CHD Risk.        ATP III CLASSIFICATION (LDL):  <100     mg/dL   Optimal  100-129  mg/dL   Near or Above                    Optimal  130-159  mg/dL   Borderline  160-189  mg/dL   High  >190     mg/dL   Very High   TRIG 64 07/04/2009     GEN- The patient is well appearing, alert and oriented x 3 today.   Head- normocephalic, atraumatic Eyes-  Sclera clear, conjunctiva pink Ears- hearing intact Oropharynx- clear Neck- supple, no  JVP Lymph- no cervical lymphadenopathy Lungs- Clear to ausculation bilaterally, normal work of breathing Heart- irregular rate and rhythm, (SR with PAc's) no murmurs, rubs or gallops, PMI not laterally displaced GI- soft, NT, ND, + BS Extremities- no clubbing, cyanosis, or edema MS- no significant deformity or atrophy Skin- no rash or lesion Psych- euthymic mood, full affect Neuro- strength and sensation are intact  EKG- atrial flutter with variable AV block, v rate 86 bpm, qrs int 62ms, qtc 447 ms    Assessment and Plan: 1. Persistent  afib/flutter Underwent successful loading of amiodarone but now out of rhythm,  possibly 2/2 stress from  loss of husband this past week vrs forgetting amiodarone x 4 days Continue amiodarone 200 mg bid Toprol was reduced to 1/2 tab a day (25 mg ) and can leave at this dose Continue cardizem 240 mg a day She will continue apixaban 2.5 mg bid for chadsvasc score of 4, no missed doses Will schedule  for cardioversion 6/13  Will reduce amiodarone to 200 mg qd at f/u here one week after cardioversion   2. HTN  Stable  F/u  In 2 weeks   Geroge Baseman. Oluwatoyin Banales, [facility] [address] [place], [place] [address] [phone]

## 2017-06-10 NOTE — Patient Instructions (Signed)
Cardioversion scheduled for Thursday, June 13th  - Arrive at the [facility] and go to admitting at 12:30PM  -Do not eat or drink anything after midnight the night prior to your procedure.  - Take all your medication with a sip of water prior to arrival.  - You will not be able to drive home after your procedure.  Continue Amiodarone 200mg  twice a day until you see [place] in follow up then reduce to 1 tablet a day

## 2017-06-10 NOTE — Progress Notes (Signed)
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 female, appearing younger than stated age,with a h/o paroxysmal afib, dx fall of 2017, with successful cardioversion, but has had increase in  afib burden  since August. She was in [place] in November when seen by Dr. Debara Pickett. She is in the afib clinic to discuss options to treat.. Pt states that she has been under a lot of stress due to her 46 yo husband with progressive dementia and Hospice is now in the home. She feel that she has been in persistent afib since December.  She is busy during the day and does not notice her afib that much. She does notice it for the first several minutes trying to get to sleep. Does not notice any undo shortness of breath with exertion or fatigue. She is on apixaban correctly dosed at 2.5 mg bid for a chadsvasc score of 4. She noted increase in HR around 1/9 and was advised to go to the ER, waited 6 hours and had not been seen so left. She feels this elevated HR was due to stress over husband.  When seen in the afib clinic one week ago, we discussed options to manage her afib. She does not sound that symptomatic. BB was increased and she is for f/u. Her BP has been stable on higher dose of BB. HR's for the most part have her reasonably rate controlled. Offered cardioversion or amiodarone but she defers at this time.  F/u in afib clinic, she is now on cardizem 240 mg a day, dose increased last week when she felt her HR was still running too high. HR in office  now in the 70's. She has thought about options and would like to try a cardioversion. Last one held her almost a year. No missed does of eliquis.  F/u in afib clinic, she had successful cardioversion and continues in SR. She did have one short lived episode of  4:1 atrial flutter after DCCV but then went back into SR. She did not get her usual energy back right after cardioversion this time but is feeling some  better.   F/i in afib clinic, 4/10, unfortunately, pt has returned to afib for the last few weeks and has noted fluctuating HR/ BP readings. At home yesterday reported higher BP readings, in the office today at 156/84. It appears that the pt is getting more symptomatic with her afib and feels fatigued. I discussed with her starting amiodarone to pursue SR and she is in agreement.   F/u in fib clinic, 4/16 ,after starting on amiodarone 200 mg bid. She called the office yesterday with a HR in the upper 40's. BB dose was reduced to 1/2 tab daily. Today, she feels better and EKG shows SR with pac's in the 60's.  F/u in afib clinic, she feels improved but still has some slow heart rates but is not symptomatic. She is staying in [place]. Will reduce amiodarone to 200 mg once a day.  F/u in afib clinic, 5/20, she called in and was reporting some low heart rates so my nurse advised to cut BB in half.  Today, she is in aflutter with controlled v rate. Her husband died this week and from the stress may have caused her to get out of rhythm vrs, she also forgot her amiodarone for 4 days.  F/u in afib clinic, pt continues to be in afib despite increasing amiodarone to 200  mg bid. The recent death of her husband has caused a lot of stress which is probably contributing to persistence of afib Discussed cardioversion and pt is in agreement. No missed doses of eliquis.  Today, she denies symptoms of palpitations, chest pain, shortness of breath, orthopnea, PND, lower extremity edema, dizziness, presyncope, syncope, or neurologic sequela.+ fatigue. The patient is tolerating medications without difficulties and is otherwise without complaint today.   Past Medical History:  Diagnosis Date  . Atrial fibrillation ([facility]) 2017   a. s/p DCCV in 10/2015  b. recurrent in 08/2016 --> rate-control pursued.   . Cancer ([facility])    Breast  . CKD (chronic kidney disease)   . GCA (giant cell arteritis) ([facility])   . Hypertension   .  Macular degeneration   . Osteoporosis   . PMR (polymyalgia rheumatica) ([facility])   . Skin cancer    Past Surgical History:  Procedure Laterality Date  . CARDIOVERSION N/A 10/18/2015   Procedure: CARDIOVERSION;  Surgeon: Jerline Pain, MD;  Location[facility] ENDOSCOPY;  Service: Cardiovascular;  Laterality: N/A;  . CARDIOVERSION N/A 02/20/2017   Procedure: CARDIOVERSION;  Surgeon: Pixie Casino, MD;  Location: [facility];  Service: Cardiovascular;  Laterality: N/A;  . KNEE SURGERY  2013  . MASTECTOMY  1998   left side    Current Outpatient Medications  Medication Sig Dispense Refill  . amiodarone (PACERONE) 200 MG tablet Take 1 tablet (200 mg total) by mouth daily. (Patient taking differently: Take 200 mg by mouth 2 (two) times daily. ) 60 tablet 0  . amLODipine (NORVASC) 2.5 MG tablet Take 1 tablet (2.5 mg total) by mouth daily. 30 tablet 3  . Bevacizumab (AVASTIN IV) Eye injection every 6 weeks    . cloNIDine (CATAPRES) 0.2 MG tablet Take 1 tablet (0.2 mg total) by mouth 3 (three) times daily. 270 tablet 1  . diazepam (VALIUM) 2 MG tablet Take 1-2 mg by mouth at bedtime as needed (sleep).   0  . diltiazem (CARDIZEM CD) 240 MG 24 hr capsule Take 240 mg by mouth daily.    [name] diltiazem (CARDIZEM) 30 MG tablet Take 1 tablet every 4 hours AS NEEDED for AFIB heart rate over 100 45 tablet 1  . ELIQUIS 2.5 MG TABS tablet TAKE 1 TABLET BY MOUTH TWICE DAILY 180 tablet 1  . furosemide (LASIX) 80 MG tablet Take 0.5 tablets (40 mg total) by mouth daily. Per PCP    . losartan (COZAAR) 100 MG tablet Take 100 mg by mouth daily. Per PCP     . metoprolol succinate (TOPROL-XL) 50 MG 24 hr tablet Take 1/2 tablet by mouth at bedtime.Take with or immediately following a meal. 180 tablet 1  . Multiple Vitamins-Minerals (PRESERVISION AREDS 2) CAPS Take 1 capsule by mouth daily.    [name] pyridoxine (B-6) 100 MG tablet Take 100 mg by mouth daily.     No current facility-administered medications for this encounter.      Allergies  Allergen Reactions  . Codeine Itching  . Penicillins Itching and Rash    Has patient had a PCN reaction causing immediate rash, facial/tongue/throat swelling, SOB or lightheadedness with hypotension: Yes Has patient had a PCN reaction causing severe rash involving mucus membranes or skin necrosis: No Has patient had a PCN reaction that required hospitalization: No Has patient had a PCN reaction occurring within the last 10 years: No If all of the above answers are "NO", then may proceed with Cephalosporin use.    Social History  Socioeconomic History  . Marital status: Married    Spouse name: Not on file  . Number of children: 2  . Years of education: Not on file  . Highest education level: Not on file  Occupational History  . Not on file  Social Needs  . Financial resource strain: Not on file  . Food insecurity:    Worry: Not on file    Inability: Not on file  . Transportation needs:    Medical: Not on file    Non-medical: Not on file  Tobacco Use  . Smoking status: Former Smoker    Last attempt to quit: 08/31/1966    Years since quitting: 50.8  . Smokeless tobacco: Never Used  Substance and Sexual Activity  . Alcohol use: No  . Drug use: No  . Sexual activity: Not on file  Lifestyle  . Physical activity:    Days per week: Not on file    Minutes per session: Not on file  . Stress: Not on file  Relationships  . Social connections:    Talks on phone: Not on file    Gets together: Not on file    Attends religious service: Not on file    Active member of club or organization: Not on file    Attends meetings of clubs or organizations: Not on file    Relationship status: Not on file  . Intimate partner violence:    Fear of current or ex partner: Not on file    Emotionally abused: Not on file    Physically abused: Not on file    Forced sexual activity: Not on file  Other Topics Concern  . Not on file  Social History Narrative   epworth sleepiness  scale = 8 (08/31/15)    Family History  Problem Relation Age of Onset  . Stroke Mother   . Kidney failure Father        died at 64  . Heart disease Sister        died at 55  . Heart disease Sister        died at 41  . Breast cancer Daughter     ROS- All systems are reviewed and negative except as per the HPI above  Physical Exam: Vitals:   06/10/17 1405  BP: 138/74  Pulse: 86  Weight: 152 lb (68.9 kg)  Height: 5\' 6"  (1.676 m)   Wt Readings from Last 3 Encounters:  06/10/17 152 lb (68.9 kg)  05/25/17 152 lb (68.9 kg)  05/06/17 150 lb 9.6 oz (68.3 kg)    Labs: Lab Results  Component Value Date   NA 140 04/15/2017   K 3.6 04/15/2017   CL 101 04/15/2017   CO2 27 04/15/2017   GLUCOSE 109 (H) 04/15/2017   BUN 27 (H) 04/15/2017   CREATININE 1.68 (H) 04/15/2017   CALCIUM 9.1 04/15/2017   MG 2.2 08/18/2016   Lab Results  Component Value Date   INR 1.1 10/11/2015   Lab Results  Component Value Date   CHOL  07/04/2009    81        ATP III CLASSIFICATION:  <200     mg/dL   Desirable  200-239  mg/dL   Borderline High  >=240    mg/dL   High          HDL 18 (L) 07/04/2009   LDLCALC  07/04/2009    50        Total Cholesterol/HDL:CHD Risk Coronary Heart Disease Risk  Table                     Men   Women  1/2 Average Risk   3.4   3.3  Average Risk       5.0   4.4  2 X Average Risk   9.6   7.1  3 X Average Risk  23.4   11.0        Use the calculated Patient Ratio above and the CHD Risk Table to determine the patient's CHD Risk.        ATP III CLASSIFICATION (LDL):  <100     mg/dL   Optimal  100-129  mg/dL   Near or Above                    Optimal  130-159  mg/dL   Borderline  160-189  mg/dL   High  >190     mg/dL   Very High   TRIG 64 07/04/2009     GEN- The patient is well appearing, alert and oriented x 3 today.   Head- normocephalic, atraumatic Eyes-  Sclera clear, conjunctiva pink Ears- hearing intact Oropharynx- clear Neck- supple, no  JVP Lymph- no cervical lymphadenopathy Lungs- Clear to ausculation bilaterally, normal work of breathing Heart- irregular rate and rhythm, (SR with PAc's) no murmurs, rubs or gallops, PMI not laterally displaced GI- soft, NT, ND, + BS Extremities- no clubbing, cyanosis, or edema MS- no significant deformity or atrophy Skin- no rash or lesion Psych- euthymic mood, full affect Neuro- strength and sensation are intact  EKG- atrial flutter with variable AV block, v rate 86 bpm, qrs int 58ms, qtc 447 ms    Assessment and Plan: 1. Persistent  afib/flutter Underwent successful loading of amiodarone but now out of rhythm,  possibly 2/2 stress from  loss of husband this past week vrs forgetting amiodarone x 4 days Continue amiodarone 200 mg bid Toprol was reduced to 1/2 tab a day (25 mg ) and can leave at this dose Continue cardizem 240 mg a day She will continue apixaban 2.5 mg bid for chadsvasc score of 4, no missed doses Will schedule  for cardioversion 6/13  Will reduce amiodarone to 200 mg qd at f/u here one week after cardioversion   2. HTN  Stable  F/u  In 2 weeks   Geroge Baseman. Jalyne Brodzinski, [facility] [address] [place], [place] [address] [phone]

## 2017-06-16 DIAGNOSIS — H353222 Exudative age-related macular degeneration, left eye, with inactive choroidal neovascularization: Secondary | ICD-10-CM | POA: Diagnosis not present

## 2017-06-16 DIAGNOSIS — H353112 Nonexudative age-related macular degeneration, right eye, intermediate dry stage: Secondary | ICD-10-CM | POA: Diagnosis not present

## 2017-06-16 DIAGNOSIS — H353211 Exudative age-related macular degeneration, right eye, with active choroidal neovascularization: Secondary | ICD-10-CM | POA: Diagnosis not present

## 2017-06-16 DIAGNOSIS — H35371 Puckering of macula, right eye: Secondary | ICD-10-CM | POA: Diagnosis not present

## 2017-06-16 DIAGNOSIS — H353122 Nonexudative age-related macular degeneration, left eye, intermediate dry stage: Secondary | ICD-10-CM | POA: Diagnosis not present

## 2017-06-18 ENCOUNTER — Other Ambulatory Visit: Payer: Self-pay

## 2017-06-18 ENCOUNTER — Ambulatory Visit (HOSPITAL_COMMUNITY)
Admission: RE | Admit: 2017-06-18 | Discharge: 2017-06-18 | Disposition: A | Discharge status: Home / Self Care | Payer: Medicare Other | Source: Ambulatory Visit | Attending: Cardiovascular Disease | Admitting: Cardiovascular Disease

## 2017-06-18 ENCOUNTER — Ambulatory Visit (HOSPITAL_COMMUNITY): Payer: Medicare Other | Admitting: Anesthesiology

## 2017-06-18 ENCOUNTER — Encounter (HOSPITAL_COMMUNITY): Payer: Self-pay | Admitting: *Deleted

## 2017-06-18 ENCOUNTER — Encounter (HOSPITAL_COMMUNITY)
Admission: RE | Disposition: A | Discharge status: Home / Self Care | Payer: Self-pay | Source: Ambulatory Visit | Attending: Cardiovascular Disease

## 2017-06-18 DIAGNOSIS — I481 Persistent atrial fibrillation: Secondary | ICD-10-CM | POA: Insufficient documentation

## 2017-06-18 DIAGNOSIS — Z87891 Personal history of nicotine dependence: Secondary | ICD-10-CM | POA: Insufficient documentation

## 2017-06-18 DIAGNOSIS — Z885 Allergy status to narcotic agent status: Secondary | ICD-10-CM | POA: Diagnosis not present

## 2017-06-18 DIAGNOSIS — Z880 Allergy status to penicillin: Secondary | ICD-10-CM | POA: Insufficient documentation

## 2017-06-18 DIAGNOSIS — M810 Age-related osteoporosis without current pathological fracture: Secondary | ICD-10-CM | POA: Insufficient documentation

## 2017-06-18 DIAGNOSIS — I4892 Unspecified atrial flutter: Secondary | ICD-10-CM | POA: Diagnosis not present

## 2017-06-18 DIAGNOSIS — Z85828 Personal history of other malignant neoplasm of skin: Secondary | ICD-10-CM | POA: Diagnosis not present

## 2017-06-18 DIAGNOSIS — Z823 Family history of stroke: Secondary | ICD-10-CM | POA: Diagnosis not present

## 2017-06-18 DIAGNOSIS — Z803 Family history of malignant neoplasm of breast: Secondary | ICD-10-CM | POA: Insufficient documentation

## 2017-06-18 DIAGNOSIS — M316 Other giant cell arteritis: Secondary | ICD-10-CM | POA: Diagnosis not present

## 2017-06-18 DIAGNOSIS — Z79899 Other long term (current) drug therapy: Secondary | ICD-10-CM | POA: Diagnosis not present

## 2017-06-18 DIAGNOSIS — Z853 Personal history of malignant neoplasm of breast: Secondary | ICD-10-CM | POA: Diagnosis not present

## 2017-06-18 DIAGNOSIS — I129 Hypertensive chronic kidney disease with stage 1 through stage 4 chronic kidney disease, or unspecified chronic kidney disease: Secondary | ICD-10-CM | POA: Insufficient documentation

## 2017-06-18 DIAGNOSIS — N189 Chronic kidney disease, unspecified: Secondary | ICD-10-CM | POA: Insufficient documentation

## 2017-06-18 DIAGNOSIS — Z9889 Other specified postprocedural states: Secondary | ICD-10-CM | POA: Diagnosis not present

## 2017-06-18 DIAGNOSIS — I484 Atypical atrial flutter: Secondary | ICD-10-CM

## 2017-06-18 DIAGNOSIS — Z7901 Long term (current) use of anticoagulants: Secondary | ICD-10-CM | POA: Insufficient documentation

## 2017-06-18 DIAGNOSIS — Z9012 Acquired absence of left breast and nipple: Secondary | ICD-10-CM | POA: Diagnosis not present

## 2017-06-18 DIAGNOSIS — Z8249 Family history of ischemic heart disease and other diseases of the circulatory system: Secondary | ICD-10-CM | POA: Insufficient documentation

## 2017-06-18 DIAGNOSIS — I480 Paroxysmal atrial fibrillation: Secondary | ICD-10-CM | POA: Diagnosis not present

## 2017-06-18 HISTORY — PX: CARDIOVERSION: SHX1299

## 2017-06-18 SURGERY — CARDIOVERSION
Anesthesia: General

## 2017-06-18 MED ORDER — LIDOCAINE 2% (20 MG/ML) 5 ML SYRINGE
INTRAMUSCULAR | Status: DC | PRN
  Administered 2017-06-18: 60 mg via INTRAVENOUS

## 2017-06-18 MED ORDER — PROPOFOL 10 MG/ML IV BOLUS
INTRAVENOUS | Status: DC | PRN
  Administered 2017-06-18: 60 mg via INTRAVENOUS

## 2017-06-18 MED ORDER — SODIUM CHLORIDE 0.9 % IV SOLN
INTRAVENOUS | Status: DC | PRN
  Administered 2017-06-18: 13:00:00 via INTRAVENOUS

## 2017-06-18 NOTE — Transfer of Care (Signed)
Immediate Anesthesia Transfer of Care Note  Patient: Allison Norman  Procedure(s) Performed: CARDIOVERSION (N/A )  Patient Location: Endoscopy Unit  Anesthesia Type:General  Level of Consciousness: drowsy  Airway & Oxygen Therapy: Patient Spontanous Breathing  Post-op Assessment: Report given to RN and Post -op Vital signs reviewed and stable  Post vital signs: Reviewed and stable  Last Vitals:  Vitals Value Taken Time  BP    Temp    Pulse    Resp    SpO2      Last Pain:  Vitals:   06/18/17 1252  TempSrc: Oral  PainSc: 0-No pain         Complications: No apparent anesthesia complications

## 2017-06-18 NOTE — Discharge Instructions (Signed)
Electrical Cardioversion, Care After °This sheet gives you information about how to care for yourself after your procedure. Your health care provider may also give you more specific instructions. If you have problems or questions, contact your health care provider. °What can I expect after the procedure? °After the procedure, it is common to have: °· Some redness on the skin where the shocks were given. ° °Follow these instructions at home: °· Do not drive for 24 hours if you were given a medicine to help you relax (sedative). °· Take over-the-counter and prescription medicines only as told by your health care provider. °· Ask your health care provider how to check your pulse. Check it often. °· Rest for 48 hours after the procedure or as told by your health care provider. °· Avoid or limit your caffeine use as told by your health care provider. °Contact a health care provider if: °· You feel like your heart is beating too quickly or your pulse is not regular. °· You have a serious muscle cramp that does not go away. °Get help right away if: °· You have discomfort in your chest. °· You are dizzy or you feel faint. °· You have trouble breathing or you are short of breath. °· Your speech is slurred. °· You have trouble moving an arm or leg on one side of your body. °· Your fingers or toes turn cold or blue. °This information is not intended to replace advice given to you by your health care provider. Make sure you discuss any questions you have with your health care provider. °Document Released: 10/13/2012 Document Revised: 07/27/2015 Document Reviewed: 06/29/2015 °Elsevier Interactive Patient Education © 2018 [employer] ° °

## 2017-06-18 NOTE — Anesthesia Preprocedure Evaluation (Signed)
Anesthesia Evaluation  Patient identified by MRN, date of birth, ID band Patient awake    Reviewed: Allergy & Precautions, NPO status , Patient's Chart, lab work & pertinent test results, reviewed documented beta blocker date and time   Airway Mallampati: II  TM Distance: >3 FB Neck ROM: Full    Dental no notable dental hx.    Pulmonary former smoker,    breath sounds clear to auscultation       Cardiovascular hypertension, Pt. on home beta blockers and Pt. on medications + dysrhythmias Atrial Fibrillation  Rhythm:Irregular Rate:Normal  '17 TTE - EF 60% to 65%. Mild MR. Left and right atria were moderately dilated. Peak RV-RA gradient: 34 mmHg. PASP 42 mm Hg   Neuro/Psych negative neurological ROS  negative psych ROS   GI/Hepatic negative GI ROS, Neg liver ROS,   Endo/Other  negative endocrine ROS  Renal/GU CRFRenal disease     Musculoskeletal  (+) Arthritis , Polymyalgia rheumatica   Abdominal   Peds  Hematology negative hematology ROS (+)   Anesthesia Other Findings Breast cancer, Giant Cell Arteritis  Reproductive/Obstetrics                             Anesthesia Physical  Anesthesia Plan  ASA: III  Anesthesia Plan: General   Post-op Pain Management:    Induction: Intravenous  PONV Risk Score and Plan: 3 and Treatment may vary due to age or medical condition and Propofol infusion  Airway Management Planned: Mask and Natural Airway  Additional Equipment: None  Intra-op Plan:   Post-operative Plan:   Informed Consent: I have reviewed the patients History and Physical, chart, labs and discussed the procedure including the risks, benefits and alternatives for the proposed anesthesia with the patient or authorized representative who has indicated his/her understanding and acceptance.     Plan Discussed with: CRNA and Anesthesiologist  Anesthesia Plan Comments:          Anesthesia Quick Evaluation

## 2017-06-18 NOTE — Anesthesia Procedure Notes (Signed)
Procedure Name: General with mask airway Date/Time: 06/18/2017 1:05 PM Performed by: Imagene Riches, CRNA Pre-anesthesia Checklist: Patient identified, Emergency Drugs available, Suction available and Patient being monitored Patient Re-evaluated:Patient Re-evaluated prior to induction Oxygen Delivery Method: Ambu bag Preoxygenation: Pre-oxygenation with 100% oxygen Induction Type: IV induction

## 2017-06-18 NOTE — Anesthesia Postprocedure Evaluation (Signed)
Anesthesia Post Note  Patient: Allison Norman  Procedure(s) Performed: CARDIOVERSION (N/A )     Patient location during evaluation: PACU Anesthesia Type: General Level of consciousness: awake and alert Pain management: pain level controlled Vital Signs Assessment: post-procedure vital signs reviewed and stable Respiratory status: spontaneous breathing, nonlabored ventilation and respiratory function stable Cardiovascular status: blood pressure returned to baseline and stable Postop Assessment: no apparent nausea or vomiting Anesthetic complications: no    Last Vitals:  Vitals:   06/18/17 1340 06/18/17 1346  BP: 135/67 135/67  Pulse: (!) 58 62  Resp: 14 (!) 21  Temp:    SpO2: 97% 96%    Last Pain:  Vitals:   06/18/17 1346  TempSrc:   PainSc: 0-No pain                 Audry Pili

## 2017-06-18 NOTE — Interval H&P Note (Signed)
History and Physical Interval Note:  06/18/2017 12:36 PM  Allison Norman  has presented today for surgery, with the diagnosis of AFIB  The various methods of treatment have been discussed with the patient and family. After consideration of risks, benefits and other options for treatment, the patient has consented to  Procedure(s): CARDIOVERSION (N/A) as a surgical intervention .  The patient's history has been reviewed, patient examined, no change in status, stable for surgery.  I have reviewed the patient's chart and labs.  Questions were answered to the patient's satisfaction.     Halo Shevlin

## 2017-06-18 NOTE — Op Note (Signed)
Procedure: Electrical Cardioversion Indications:  Atrial Flutter  Procedure Details:  Consent: Risks of procedure as well as the alternatives and risks of each were explained to the (patient/caregiver).  Consent for procedure obtained.  Time Out: Verified patient identification, verified procedure, site/side was marked, verified correct patient position, special equipment/implants available, medications/allergies/relevent history reviewed, required imaging and test results available.  Performed  Patient placed on cardiac monitor, pulse oximetry, supplemental oxygen as necessary.  Sedation given: Propofol 60 mg IV, Dr.Brock Pacer pads placed anterior and posterior chest.  Cardioverted 1 time(s).  Cardioversion with synchronized biphasic 120J shock.  Evaluation: Findings: Post procedure EKG shows: NSR Complications: None Patient did tolerate procedure well.  Time Spent Directly with the Patient:  30 minutes   Allison Norman 06/18/2017, 1:23 PM

## 2017-06-20 ENCOUNTER — Encounter (HOSPITAL_COMMUNITY): Payer: Self-pay | Admitting: Cardiovascular Disease

## 2017-06-29 ENCOUNTER — Ambulatory Visit (HOSPITAL_COMMUNITY): Payer: Medicare Other | Admitting: Nurse Practitioner

## 2017-07-01 NOTE — Addendum Note (Signed)
Encounter addended by: Sherran Needs, NP on: 07/01/2017 8:53 AM  Actions taken: LOS modified

## 2017-07-04 DIAGNOSIS — W19XXXA Unspecified fall, initial encounter: Secondary | ICD-10-CM | POA: Diagnosis not present

## 2017-07-04 DIAGNOSIS — S81012A Laceration without foreign body, left knee, initial encounter: Secondary | ICD-10-CM | POA: Diagnosis not present

## 2017-07-04 DIAGNOSIS — S81802A Unspecified open wound, left lower leg, initial encounter: Secondary | ICD-10-CM | POA: Diagnosis not present

## 2017-07-06 DIAGNOSIS — S80212A Abrasion, left knee, initial encounter: Secondary | ICD-10-CM | POA: Diagnosis not present

## 2017-07-07 ENCOUNTER — Ambulatory Visit (HOSPITAL_COMMUNITY)
Admission: RE | Admit: 2017-07-07 | Discharge: 2017-07-07 | Disposition: A | Discharge status: Home / Self Care | Payer: Medicare Other | Source: Ambulatory Visit | Attending: Nurse Practitioner | Admitting: Nurse Practitioner

## 2017-07-07 ENCOUNTER — Encounter (HOSPITAL_COMMUNITY): Payer: Self-pay | Admitting: Nurse Practitioner

## 2017-07-07 VITALS — BP 154/62 | HR 43 | Ht 66.0 in | Wt 151.0 lb

## 2017-07-07 DIAGNOSIS — Z885 Allergy status to narcotic agent status: Secondary | ICD-10-CM | POA: Insufficient documentation

## 2017-07-07 DIAGNOSIS — Z9889 Other specified postprocedural states: Secondary | ICD-10-CM | POA: Insufficient documentation

## 2017-07-07 DIAGNOSIS — I4819 Other persistent atrial fibrillation: Secondary | ICD-10-CM

## 2017-07-07 DIAGNOSIS — Z7901 Long term (current) use of anticoagulants: Secondary | ICD-10-CM | POA: Insufficient documentation

## 2017-07-07 DIAGNOSIS — Z79899 Other long term (current) drug therapy: Secondary | ICD-10-CM | POA: Diagnosis not present

## 2017-07-07 DIAGNOSIS — H3530 Unspecified macular degeneration: Secondary | ICD-10-CM | POA: Diagnosis not present

## 2017-07-07 DIAGNOSIS — Z880 Allergy status to penicillin: Secondary | ICD-10-CM | POA: Diagnosis not present

## 2017-07-07 DIAGNOSIS — Z8249 Family history of ischemic heart disease and other diseases of the circulatory system: Secondary | ICD-10-CM | POA: Insufficient documentation

## 2017-07-07 DIAGNOSIS — I4892 Unspecified atrial flutter: Secondary | ICD-10-CM | POA: Diagnosis not present

## 2017-07-07 DIAGNOSIS — Z9012 Acquired absence of left breast and nipple: Secondary | ICD-10-CM | POA: Diagnosis not present

## 2017-07-07 DIAGNOSIS — Z87891 Personal history of nicotine dependence: Secondary | ICD-10-CM | POA: Diagnosis not present

## 2017-07-07 DIAGNOSIS — M353 Polymyalgia rheumatica: Secondary | ICD-10-CM | POA: Insufficient documentation

## 2017-07-07 DIAGNOSIS — N189 Chronic kidney disease, unspecified: Secondary | ICD-10-CM | POA: Insufficient documentation

## 2017-07-07 DIAGNOSIS — Z85828 Personal history of other malignant neoplasm of skin: Secondary | ICD-10-CM | POA: Insufficient documentation

## 2017-07-07 DIAGNOSIS — I481 Persistent atrial fibrillation: Secondary | ICD-10-CM | POA: Insufficient documentation

## 2017-07-07 DIAGNOSIS — Z823 Family history of stroke: Secondary | ICD-10-CM | POA: Insufficient documentation

## 2017-07-07 DIAGNOSIS — Z853 Personal history of malignant neoplasm of breast: Secondary | ICD-10-CM | POA: Diagnosis not present

## 2017-07-07 DIAGNOSIS — I129 Hypertensive chronic kidney disease with stage 1 through stage 4 chronic kidney disease, or unspecified chronic kidney disease: Secondary | ICD-10-CM | POA: Insufficient documentation

## 2017-07-07 NOTE — Progress Notes (Signed)
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 female, appearing younger than stated age,with a h/o paroxysmal afib, dx fall of 2017, with successful cardioversion, but has had increase in  afib burden  since August. She was in [place] in November when seen by Dr. Debara Pickett. She is in the afib clinic to discuss options to treat.. Pt states that she has been under a lot of stress due to her 46 yo husband with progressive dementia and Hospice is now in the home. She feel that she has been in persistent afib since December.  She is busy during the day and does not notice her afib that much. She does notice it for the first several minutes trying to get to sleep. Does not notice any undo shortness of breath with exertion or fatigue. She is on apixaban correctly dosed at 2.5 mg bid for a chadsvasc score of 4. She noted increase in HR around 1/9 and was advised to go to the ER, waited 6 hours and had not been seen so left. She feels this elevated HR was due to stress over husband.  When seen in the afib clinic one week ago, we discussed options to manage her afib. She does not sound that symptomatic. BB was increased and she is for f/u. Her BP has been stable on higher dose of BB. HR's for the most part have her reasonably rate controlled. Offered cardioversion or amiodarone but she defers at this time.  F/u in afib clinic, she is now on cardizem 240 mg a day, dose increased last week when she felt her HR was still running too high. HR in office  now in the 70's. She has thought about options and would like to try a cardioversion. Last one held her almost a year. No missed does of eliquis.  F/u in afib clinic, she had successful cardioversion and continues in SR. She did have one short lived episode of  4:1 atrial flutter after DCCV but then went back into SR. She did not get her usual energy back right after cardioversion this time but is feeling some  better.   F/i in afib clinic, 4/10, unfortunately, pt has returned to afib for the last few weeks and has noted fluctuating HR/ BP readings. At home yesterday reported higher BP readings, in the office today at 156/84. It appears that the pt is getting more symptomatic with her afib and feels fatigued. I discussed with her starting amiodarone to pursue SR and she is in agreement.   F/u in fib clinic, 4/16 ,after starting on amiodarone 200 mg bid. She called the office yesterday with a HR in the upper 40's. BB dose was reduced to 1/2 tab daily. Today, she feels better and EKG shows SR with pac's in the 60's.  F/u in afib clinic, she feels improved but still has some slow heart rates but is not symptomatic. She is staying in [place]. Will reduce amiodarone to 200 mg once a day.  F/u in afib clinic, 5/20, she called in and was reporting some low heart rates so my nurse advised to cut BB in half.  Today, she is in aflutter with controlled v rate. Her husband died this week and from the stress may have caused her to get out of rhythm vrs, she also forgot her amiodarone for 4 days.  F/u in afib clinic, pt continues to be in afib despite increasing amiodarone to 200  mg bid. The recent death of her husband has caused a lot of stress which is probably contributing to persistence of afib Discussed cardioversion and pt is in agreement. No missed doses of eliquis.  F/u in afib clinic following cardioversion which was successful and she continues in Sinus brady in the 40's and will lower rate control. She was suppose to come in last week but had a mechanical fall  with a significant abrasion to her knee and is seeing her PCP for dressings.   Today, she denies symptoms of palpitations, chest pain, shortness of breath, orthopnea, PND, lower extremity edema, dizziness, presyncope, syncope, or neurologic sequela.+ fatigue. The patient is tolerating medications without difficulties and is otherwise without complaint today.     Past Medical History:  Diagnosis Date  . Atrial fibrillation ([place]) 2017   a. s/p DCCV in 10/2015  b. recurrent in 08/2016 --> rate-control pursued.   . Cancer ([place])    Breast  . CKD (chronic kidney disease)   . GCA (giant cell arteritis) ([place])   . Hypertension   . Macular degeneration   . Osteoporosis   . PMR (polymyalgia rheumatica) ([facility])   . Skin cancer    Past Surgical History:  Procedure Laterality Date  . CARDIOVERSION N/A 10/18/2015   Procedure: CARDIOVERSION;  Surgeon: Jerline Pain, MD;  Location: [facility];  Service: Cardiovascular;  Laterality: N/A;  . CARDIOVERSION N/A 02/20/2017   Procedure: CARDIOVERSION;  Surgeon: Pixie Casino, MD;  Location: [facility] ENDOSCOPY;  Service: Cardiovascular;  Laterality: N/A;  . CARDIOVERSION N/A 06/18/2017   Procedure: CARDIOVERSION;  Surgeon: Sanda Klein, MD;  Location: [facility] ENDOSCOPY;  Service: Cardiovascular;  Laterality: N/A;  . KNEE SURGERY  2013  . MASTECTOMY  1998   left side    Current Outpatient Medications  Medication Sig Dispense Refill  . amiodarone (PACERONE) 200 MG tablet Take 1 tablet (200 mg total) by mouth daily. 60 tablet 0  . Bevacizumab (AVASTIN IV) Eye injection every 6 weeks    . cloNIDine (CATAPRES) 0.2 MG tablet Take 1 tablet (0.2 mg total) by mouth 3 (three) times daily. 270 tablet 1  . diazepam (VALIUM) 2 MG tablet Take 2 mg by mouth at bedtime as needed (for sleep).   0  . diltiazem (CARDIZEM CD) 240 MG 24 hr capsule Take 240 mg by mouth daily.    [name] ELIQUIS 2.5 MG TABS tablet TAKE 1 TABLET BY MOUTH TWICE DAILY 180 tablet 1  . furosemide (LASIX) 80 MG tablet Take 0.5 tablets (40 mg total) by mouth daily. Per PCP    . losartan (COZAAR) 100 MG tablet Take 100 mg by mouth daily. Per PCP     . metoprolol succinate (TOPROL-XL) 50 MG 24 hr tablet Take 1/2 tablet by mouth at bedtime.Take with or immediately following a meal. (Patient taking differently: Take 25 mg by mouth daily. Take with or immediately  following a meal.) 180 tablet 1  . Multiple Vitamins-Minerals (PRESERVISION AREDS 2) CAPS Take 1 capsule by mouth daily.    . predniSONE (DELTASONE) 5 MG tablet Take 5 mg by mouth daily.  0  . pyridoxine (B-6) 100 MG tablet Take 100 mg by mouth daily.    [name] diltiazem (CARDIZEM) 30 MG tablet Take 1 tablet every 4 hours AS NEEDED for AFIB heart rate over 100 (Patient not taking: Reported on 07/07/2017) 45 tablet 1   No current facility-administered medications for this encounter.     Allergies  Allergen Reactions  . Codeine Itching  .  Penicillins Itching, Rash and Other (See Comments)    Has patient had a PCN reaction causing immediate rash, facial/tongue/throat swelling, SOB or lightheadedness with hypotension: Yes Has patient had a PCN reaction causing severe rash involving mucus membranes or skin necrosis: No Has patient had a PCN reaction that required hospitalization: No Has patient had a PCN reaction occurring within the last 10 years: No If all of the above answers are "NO", then may proceed with Cephalosporin use.    Social History   Socioeconomic History  . Marital status: Married    Spouse name: Not on file  . Number of children: 2  . Years of education: Not on file  . Highest education level: Not on file  Occupational History  . Not on file  Social Needs  . Financial resource strain: Not on file  . Food insecurity:    Worry: Not on file    Inability: Not on file  . Transportation needs:    Medical: Not on file    Non-medical: Not on file  Tobacco Use  . Smoking status: Former Smoker    Last attempt to quit: 08/31/1966    Years since quitting: 50.8  . Smokeless tobacco: Never Used  Substance and Sexual Activity  . Alcohol use: No  . Drug use: No  . Sexual activity: Not on file  Lifestyle  . Physical activity:    Days per week: Not on file    Minutes per session: Not on file  . Stress: Not on file  Relationships  . Social connections:    Talks on phone: Not on  file    Gets together: Not on file    Attends religious service: Not on file    Active member of club or organization: Not on file    Attends meetings of clubs or organizations: Not on file    Relationship status: Not on file  . Intimate partner violence:    Fear of current or ex partner: Not on file    Emotionally abused: Not on file    Physically abused: Not on file    Forced sexual activity: Not on file  Other Topics Concern  . Not on file  Social History Narrative   epworth sleepiness scale = 8 (08/31/15)    Family History  Problem Relation Age of Onset  . Stroke Mother   . Kidney failure Father        died at 61  . Heart disease Sister        died at 10  . Heart disease Sister        died at 7  . Breast cancer Daughter     ROS- All systems are reviewed and negative except as per the HPI above  Physical Exam: Vitals:   07/07/17 1405  BP: (!) 154/62  Pulse: (!) 43  Weight: 151 lb (68.5 kg)  Height: 5\' 6"  (1.676 m)   Wt Readings from Last 3 Encounters:  07/07/17 151 lb (68.5 kg)  06/10/17 152 lb (68.9 kg)  05/25/17 152 lb (68.9 kg)    Labs: Lab Results  Component Value Date   NA 135 06/10/2017   K 4.4 06/10/2017   CL 99 (L) 06/10/2017   CO2 27 06/10/2017   GLUCOSE 141 (H) 06/10/2017   BUN 24 (H) 06/10/2017   CREATININE 1.71 (H) 06/10/2017   CALCIUM 8.8 (L) 06/10/2017   MG 2.2 08/18/2016   Lab Results  Component Value Date   INR 1.1 10/11/2015  Lab Results  Component Value Date   CHOL  07/04/2009    81        ATP III CLASSIFICATION:  <200     mg/dL   Desirable  200-239  mg/dL   Borderline High  >=240    mg/dL   High          HDL 18 (L) 07/04/2009   LDLCALC  07/04/2009    50        Total Cholesterol/HDL:CHD Risk Coronary Heart Disease Risk Table                     Men   Women  1/2 Average Risk   3.4   3.3  Average Risk       5.0   4.4  2 X Average Risk   9.6   7.1  3 X Average Risk  23.4   11.0        Use the calculated Patient  Ratio above and the CHD Risk Table to determine the patient's CHD Risk.        ATP III CLASSIFICATION (LDL):  <100     mg/dL   Optimal  100-129  mg/dL   Near or Above                    Optimal  130-159  mg/dL   Borderline  160-189  mg/dL   High  >190     mg/dL   Very High   TRIG 64 07/04/2009     GEN- The patient is well appearing, alert and oriented x 3 today.   Head- normocephalic, atraumatic Eyes-  Sclera clear, conjunctiva pink Ears- hearing intact Oropharynx- clear Neck- supple, no JVP Lymph- no cervical lymphadenopathy Lungs- Clear to ausculation bilaterally, normal work of breathing Heart- regular rate and rhythm, (SR with PAc's) no murmurs, rubs or gallops, PMI not laterally displaced GI- soft, NT, ND, + BS Extremities- no clubbing, cyanosis, or edema MS- no significant deformity or atrophy Skin- no rash or lesion Psych- euthymic mood, full affect Neuro- strength and sensation are intact  EKG- Marked sinus brady at 43 bpm, pr int 280 msa, qrs int 86 ms, qtc 398 ms    Assessment and Plan: 1. Persistent  afib/flutter Underwent   loading of amiodarone and successful cardioversion 6/13.   Continue amiodarone 200 mg  qd Toprol was reduced to 1/2 tab a day (25 mg ) by our records but pt feels she is still taking a full tablet She will either reduce to 1/2 tab a day if on full tab or stop drug if taking 1/2 tab a day for bradycardia Continue cardizem 240 mg a day She will continue apixaban 2.5 mg bid for chadsvasc score of 4    2. HTN Stable Watch BP with reduction or stopping BB  F/u  With Dr. Debara Pickett 7/29 Afib clinic as needed   Butch Penny C. Donnis Phaneuf, [facility] [address] [place], [place] [address] [phone]

## 2017-07-08 ENCOUNTER — Other Ambulatory Visit: Payer: Self-pay | Admitting: Internal Medicine

## 2017-07-16 ENCOUNTER — Telehealth (HOSPITAL_COMMUNITY): Payer: Self-pay | Admitting: *Deleted

## 2017-07-16 ENCOUNTER — Other Ambulatory Visit (HOSPITAL_COMMUNITY): Payer: Self-pay | Admitting: *Deleted

## 2017-07-16 MED ORDER — AMIODARONE HCL 200 MG PO TABS: 200.0000 mg | ORAL_TABLET | Freq: Every day | ORAL | 3 refills | Status: DC

## 2017-07-16 MED ORDER — DILTIAZEM HCL ER COATED BEADS 180 MG PO CP24: 180.0000 mg | ORAL_CAPSULE | Freq: Every day | ORAL | Status: DC

## 2017-07-16 NOTE — Telephone Encounter (Signed)
Patient called in stating her HR is still running in the 45-58 range despite stopping metoprolol last week. At times feels "funny in the head" discussed with Roderic Palau NP will reduce cardizem to 180mg  once a day from 240. Pt in agreement. She will track HRs and take to follow up with Dr. Debara Pickett end of July.

## 2017-07-22 ENCOUNTER — Other Ambulatory Visit (HOSPITAL_COMMUNITY): Payer: Self-pay | Admitting: *Deleted

## 2017-07-22 MED ORDER — AMIODARONE HCL 200 MG PO TABS: 200.0000 mg | ORAL_TABLET | Freq: Every day | ORAL | 3 refills | Status: DC

## 2017-07-27 DIAGNOSIS — L570 Actinic keratosis: Secondary | ICD-10-CM | POA: Diagnosis not present

## 2017-07-27 DIAGNOSIS — Z85828 Personal history of other malignant neoplasm of skin: Secondary | ICD-10-CM | POA: Diagnosis not present

## 2017-07-28 DIAGNOSIS — H353112 Nonexudative age-related macular degeneration, right eye, intermediate dry stage: Secondary | ICD-10-CM | POA: Diagnosis not present

## 2017-07-28 DIAGNOSIS — H353211 Exudative age-related macular degeneration, right eye, with active choroidal neovascularization: Secondary | ICD-10-CM | POA: Diagnosis not present

## 2017-07-28 DIAGNOSIS — H353222 Exudative age-related macular degeneration, left eye, with inactive choroidal neovascularization: Secondary | ICD-10-CM | POA: Diagnosis not present

## 2017-07-28 DIAGNOSIS — H353122 Nonexudative age-related macular degeneration, left eye, intermediate dry stage: Secondary | ICD-10-CM | POA: Diagnosis not present

## 2017-08-03 ENCOUNTER — Ambulatory Visit (INDEPENDENT_AMBULATORY_CARE_PROVIDER_SITE_OTHER): Payer: Medicare Other | Admitting: Internal Medicine

## 2017-08-03 ENCOUNTER — Encounter: Payer: Self-pay | Admitting: Internal Medicine

## 2017-08-03 VITALS — BP 179/69 | HR 59 | Ht 66.0 in | Wt 150.6 lb

## 2017-08-03 DIAGNOSIS — I480 Paroxysmal atrial fibrillation: Secondary | ICD-10-CM | POA: Diagnosis not present

## 2017-08-03 DIAGNOSIS — I498 Other specified cardiac arrhythmias: Secondary | ICD-10-CM | POA: Diagnosis not present

## 2017-08-03 DIAGNOSIS — I10 Essential (primary) hypertension: Secondary | ICD-10-CM | POA: Diagnosis not present

## 2017-08-03 MED ORDER — METOPROLOL SUCCINATE ER 25 MG PO TB24: 12.5000 mg | ORAL_TABLET | Freq: Every day | ORAL | 11 refills | Status: DC

## 2017-08-03 MED ORDER — AMLODIPINE BESYLATE 5 MG PO TABS: 5.0000 mg | ORAL_TABLET | Freq: Every day | ORAL | 11 refills | Status: DC

## 2017-08-03 NOTE — Progress Notes (Signed)
OFFICE NOTE  Chief Complaint:  Follow-up A. fib  Primary Care Physician: [name], MD  HPI:  Allison Norman is a 84 y.o. female who is coming referred to me for new onset atrial fibrillation. Recently Allison Norman had noted that her heart was fluttering somewhat when she laid on her left side. She presented for evaluation of leg swelling and was noted to have an irregular heartbeat. EKG was performed which showed a new onset atrial fibrillation. She has had somewhat elevated ventricular responses from the 90s to 120s. She was a ready on the metoprolol as well as diltiazem and was placed on Eliquis 2.5 mg twice daily. This is based on age greater than 69 and creatinine of 1.57. Allison Norman is been on this dose for 2 days and has had some bleeding problems. She has a history of thin skin and was on steroids for PMR. She had some bleeding over her left forearm which was dressed today. She denies any chest pain or worsening shortness of breath. 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  10/11/2015  Allison Norman returns today for follow-up. She remains in atrial fibrillation with controlled ventricular response. I had increased her diltiazem however this apparently dropped her blood pressure. She was also on losartan. She did not feel well with a low blood pressure and decrease the dose back to her original dose. She's been compliant with her anticoagulation and has been anticoagulated for close to 4 weeks. We discussed elective cardioversion and she is willing to proceed.  12/05/2015  Allison Norman was seen today in follow-up. She had successful cardioversion to sinus rhythm and notes a marked improvement in her energy level. She did not realize how much it was affecting her. She had some confusion as to whether or not she needs to stay on anticoagulation, however I mentioned that should be a lifetime medication for  her. She is maintaining sinus rhythm today at 78.  07/10/2016  Allison Norman returns for follow-up. She is doing well back in sinus rhythm. She is on a was for CHADSVASC score 4. She says she feels markedly better in sinus rhythm and therefore she should be able to monitor for symptoms and recurrence. Blood pressure is at goal today. Initially was elevated however recheck came down to 140/74. She has had some leg edema in the past which is basically resolved.  11/25/2016  Allison Norman returns today for follow-up of her A. fib.  Recently she saw [place], PA-C, who noted she had maintained sinus rhythm however heart rate was elevated and her Cardizem was increased from 180 mg to 240 mg daily for better rate control.  She says she felt worse with this change and thought that her blood pressure was too low, therefore she decreased the dose back to her home dose.  Blood pressure appears to be well controlled today.  08/03/2017  Allison Norman was seen today in follow-up.  Blood pressure is noted to be elevated today.  She was recently seen by Roderic Palau, NP in the A. fib clinic.  She has been successfully loaded on amiodarone after recent cardioversion and seems to be maintaining sinus rhythm.  Her Toprol was reduced and eventually discontinued.  She is on Cardizem and Eliquis.  Blood pressure is now higher.  She said when she was on a beta-blocker she felt like her blood pressure was better controlled.  Today she  is reporting palpitations.  She notes that she has some missed heartbeats every so often.  EKG shows what appear to be sinus rhythm and fusion beats  PMHx:  Past Medical History:  Diagnosis Date  . Atrial fibrillation ([place]) 2017   a. s/p DCCV in 10/2015  b. recurrent in 08/2016 --> rate-control pursued.   . Cancer ([place])    Breast  . CKD (chronic kidney disease)   . GCA (giant cell arteritis) ([place])   . Hypertension   . Macular degeneration   . Osteoporosis   . PMR (polymyalgia  rheumatica) ([facility])   . Skin cancer     Past Surgical History:  Procedure Laterality Date  . CARDIOVERSION N/A 10/18/2015   Procedure: CARDIOVERSION;  Surgeon: Jerline Pain, MD;  Location: [place];  Service: Cardiovascular;  Laterality: N/A;  . CARDIOVERSION N/A 02/20/2017   Procedure: CARDIOVERSION;  Surgeon: Pixie Casino, MD;  Location: [facility] ENDOSCOPY;  Service: Cardiovascular;  Laterality: N/A;  . CARDIOVERSION N/A 06/18/2017   Procedure: CARDIOVERSION;  Surgeon: Sanda Klein, MD;  Location: [facility] ENDOSCOPY;  Service: Cardiovascular;  Laterality: N/A;  . KNEE SURGERY  2013  . MASTECTOMY  1998   left side    FAMHx:  Family History  Problem Relation Age of Onset  . Stroke Mother   . Kidney failure Father        died at 56  . Heart disease Sister        died at 32  . Heart disease Sister        died at 77  . Breast cancer Daughter     SOCHx:   reports that she quit smoking about 50 years ago. She has never used smokeless tobacco. She reports that she does not drink alcohol or use drugs.  ALLERGIES:  Allergies  Allergen Reactions  . Codeine Itching  . Penicillins Itching, Rash and Other (See Comments)    Has patient had a PCN reaction causing immediate rash, facial/tongue/throat swelling, SOB or lightheadedness with hypotension: Yes Has patient had a PCN reaction causing severe rash involving mucus membranes or skin necrosis: No Has patient had a PCN reaction that required hospitalization: No Has patient had a PCN reaction occurring within the last 10 years: No If all of the above answers are "NO", then may proceed with Cephalosporin use.    ROS: Pertinent items noted in HPI and remainder of comprehensive ROS otherwise negative.  HOME MEDS: Current Outpatient Medications  Medication Sig Dispense Refill  . amiodarone (PACERONE) 200 MG tablet Take 1 tablet (200 mg total) by mouth daily. 90 tablet 3  . Bevacizumab (AVASTIN IV) Eye injection every 6 weeks    .  cloNIDine (CATAPRES) 0.2 MG tablet TAKE 1 TABLET BY MOUTH THREE TIMES DAILY 270 tablet 1  . diazepam (VALIUM) 2 MG tablet Take 2 mg by mouth at bedtime as needed (for sleep).   0  . diltiazem (CARDIZEM CD) 180 MG 24 hr capsule Take 1 capsule (180 mg total) by mouth daily.    [name] diltiazem (CARDIZEM) 30 MG tablet Take 1 tablet every 4 hours AS NEEDED for AFIB heart rate over 100 45 tablet 1  . ELIQUIS 2.5 MG TABS tablet TAKE 1 TABLET BY MOUTH TWICE DAILY 180 tablet 1  . furosemide (LASIX) 80 MG tablet Take 0.5 tablets (40 mg total) by mouth daily. Per PCP    . losartan (COZAAR) 100 MG tablet Take 100 mg by mouth daily. Per PCP     .  Multiple Vitamins-Minerals (PRESERVISION AREDS 2) CAPS Take 1 capsule by mouth daily.    . predniSONE (DELTASONE) 5 MG tablet Take 5 mg by mouth daily.  0  . pyridoxine (B-6) 100 MG tablet Take 100 mg by mouth daily.     No current facility-administered medications for this visit.     LABS/IMAGING: No results found for this or any previous visit (from the past 48 hour(s)). No results found.  WEIGHTS: Wt Readings from Last 3 Encounters:  08/03/17 150 lb 9.6 oz (68.3 kg)  07/07/17 151 lb (68.5 kg)  06/10/17 152 lb (68.9 kg)    VITALS: BP (!) 179/69   Pulse (!) 59   Ht 5\' 6"  (1.676 m)   Wt 150 lb 9.6 oz (68.3 kg)   BMI 24.31 kg/m   EXAM: General appearance: alert and no distress Neck: no carotid bruit, no JVD and thyroid not enlarged, symmetric, no tenderness/mass/nodules Lungs: clear to auscultation bilaterally Heart: regular rate and rhythm, S1, S2 normal, no murmur, click, rub or gallop Abdomen: soft, non-tender; bowel sounds normal; no masses,  no organomegaly Extremities: extremities normal, atraumatic, no cyanosis or edema Pulses: 2+ and symmetric Skin: Skin color, texture, turgor normal. No rashes or lesions Neurologic: Grossly normal Psych: Pleasant  EKG: Sinus bradycardia with fusion beats and first-degree AV block at 69-personally  reviewed  ASSESSMENT: 1. New onset A. fib-CHADSVASC score of 4 - s/p successful DCCV (10/2015) - on Eliquis 2. Hypertension 3. Leg edema  PLAN: 1.  Allison Norman seems to be maintaining sinus rhythm on amiodarone although is noted to have fusion beats today on her EKG.  Blood pressure is not well controlled particularly after recent discontinuation some of her medications.  We will plan to stop diltiazem and start amlodipine 5 mg daily for blood pressure benefit.  Will restart metoprolol succinate 12.5 mg daily for additional rate control.  Follow-up with me in 4 to 6 weeks.  Pixie Casino, MD, [facility], [place] Director of the [facility] Lipid Disorders &  Cardiovascular Risk Reduction Clinic Attending Cardiologist  Direct Dial: [phone]  Fax: [phone]  Website:  [email or web address] 08/03/2017, 2:30 PM

## 2017-08-03 NOTE — Patient Instructions (Signed)
Your physician has recommended you make the following change in your medication:  -- START amlodipine 5mg  daily -- START metoprolol succinate 12.5mg  daily (half of 25mg ) -- STOP diltiazem   Your physician recommends that you schedule a follow-up appointment in: 4-6 weeks with Dr. Debara Pickett

## 2017-08-06 DIAGNOSIS — S61431A Puncture wound without foreign body of right hand, initial encounter: Secondary | ICD-10-CM | POA: Diagnosis not present

## 2017-08-06 DIAGNOSIS — W19XXXA Unspecified fall, initial encounter: Secondary | ICD-10-CM | POA: Diagnosis not present

## 2017-08-10 ENCOUNTER — Telehealth: Payer: Self-pay | Admitting: Internal Medicine

## 2017-08-10 MED ORDER — AMLODIPINE BESYLATE 10 MG PO TABS: 10.0000 mg | ORAL_TABLET | Freq: Every day | ORAL | 11 refills | Status: DC

## 2017-08-10 NOTE — Telephone Encounter (Signed)
Pt advised that she may increase her Amlodipine to 10mg  a day and will continue to monitor her HR and BP and let us know how she is doing. Pt agreed and verbalized understanding.

## 2017-08-10 NOTE — Telephone Encounter (Signed)
Patient may increase amlodipine to 10mg  daily. Continue to monitor BP and HR.

## 2017-08-10 NOTE — Telephone Encounter (Signed)
New Message:      Pt c/o BP issue: STAT if pt c/o blurred vision, one-sided weakness or slurred speech  1. What are your last 5 BP readings? 182/94, 192/87: These are readings from yesterday.  2. Are you having any other symptoms (ex. Dizziness, headache, blurred vision, passed out)? No  3. What is your BP issue? Patient states her BP medication was just changed because of a low heart rate and now this change has made her BP go up.

## 2017-08-10 NOTE — Telephone Encounter (Signed)
Pt called to report that she is still having increased BP.[name] Yesterday it was 182/94 in the afternoon she had taken her Amlodipine 5mg  that morning which was started on 7/29 after her Diltiazem was discontinued and her Metoprolol 12.5mg . Her HR was 70. Today her BP still elevated.. 010/27 [id number]. She has taken her meds today. She denies and symptoms such as headache, chest pain, dizziness, says she feels fine. I advised her that I will check with Dr. Debara Pickett Pharmacist and see if they would like to make any raquchanges to her meds.

## 2017-08-27 DIAGNOSIS — R296 Repeated falls: Secondary | ICD-10-CM | POA: Diagnosis not present

## 2017-09-03 ENCOUNTER — Other Ambulatory Visit: Payer: Self-pay

## 2017-09-03 ENCOUNTER — Ambulatory Visit: Payer: Medicare Other | Attending: Family Medicine | Admitting: Physical Therapy

## 2017-09-03 ENCOUNTER — Encounter: Payer: Self-pay | Admitting: Physical Therapy

## 2017-09-03 DIAGNOSIS — R262 Difficulty in walking, not elsewhere classified: Secondary | ICD-10-CM | POA: Diagnosis not present

## 2017-09-03 DIAGNOSIS — R296 Repeated falls: Secondary | ICD-10-CM | POA: Insufficient documentation

## 2017-09-03 DIAGNOSIS — R2681 Unsteadiness on feet: Secondary | ICD-10-CM | POA: Insufficient documentation

## 2017-09-03 DIAGNOSIS — M6281 Muscle weakness (generalized): Secondary | ICD-10-CM | POA: Diagnosis not present

## 2017-09-03 NOTE — Therapy (Signed)
[facility] [address] [place], [place], [address] Phone: [phone]   Fax:  [phone]  Physical Therapy Evaluation  Patient Details  Name: Allison Norman MRN: [id number] Date of Birth: [birth year] Referring Provider: [name], MD    Encounter Date: 09/03/2017  PT End of Session - 09/03/17 2108    Visit Number  1    Number of Visits  17    Date for PT Re-Evaluation  11/02/17    Authorization Type  Medicare A and B, BCBS  10th visit PN to MD    PT Start Time  2094    PT Stop Time  1530    PT Time Calculation (min)  45 min    Activity Tolerance  Patient tolerated treatment well    Behavior During Therapy  [facility] for tasks assessed/performed       Past Medical History:  Diagnosis Date  . Atrial fibrillation ([place]) 2017   a. s/p DCCV in 10/2015  b. recurrent in 08/2016 --> rate-control pursued.   . Cancer ([place])    Breast  . CKD (chronic kidney disease)   . GCA (giant cell arteritis) ([place])   . Hypertension   . Macular degeneration   . Osteoporosis   . PMR (polymyalgia rheumatica) (H[facility])   . Skin cancer     Past Surgical History:  Procedure Laterality Date  . CARDIOVERSION N/A 10/18/2015   Procedure: CARDIOVERSION;  Surgeon: Jerline Pain, MD;  Location: [facility];  Service: Cardiovascular;  Laterality: N/A;  . CARDIOVERSION N/A 02/20/2017   Procedure: CARDIOVERSION;  Surgeon: Pixie Casino, MD;  [facility] ENDOSCOPY;  Service: Cardiovascular;  Laterality: N/A;  . CARDIOVERSION N/A 06/18/2017   Procedure: CARDIOVERSION;  Surgeon: Sanda Klein, MD;  Location: [facility] ENDOSCOPY;  Service: Cardiovascular;  Laterality: N/A;  . KNEE SURGERY  2013  . MASTECTOMY  1998   left side    There were no vitals filed for this visit.   Subjective Assessment - 09/03/17 1450    Subjective  Pt reports numerous falls at home that began 3 months ago following husband's passing; 2 inside her home and 2 outside  her home.  First fall resulted in large laceration of L knee; was in her closet getting a sweater - jerked it and fell backwards then fell on knee trying to get up.  Pt tends to fall backwards but did fall forwards outside when reaching down to weed garden.  Just recently started using [facility] but doesn't feel it is helping her.    Patient is accompained by:  Family member    Pertinent History  HTN, temporal arteritis, Afib, CKD, MRSA, polymyalgia rheumatic, macular degeneration, osteoporosis, skin cancer, and R TKA.      Limitations  Standing;Walking    Patient Stated Goals  To improve balance; pt is now fearful of going out of her condo    Currently in Pain?  No/denies   knees sore from getting off floor        [facility] PT Assessment - 09/03/17 1458      Assessment   Medical Diagnosis  falls    Referring Provider  [name], MD     Onset Date/Surgical Date  08/31/17    Prior Therapy  after TKA      Precautions   Precautions  Fall;Other (comment)    Precaution Comments  HTN, temporal arteritis, Afib, CKD, MRSA, polymyalgia rheumatic, macular degeneration, osteoporosis, skin cancer, and R TKA.  Balance Screen   Has the patient fallen in the past 6 months  Yes    How many times?  4    Has the patient had a decrease in activity level because of a fear of falling?   Yes    Is the patient reluctant to leave their home because of a fear of falling?   Yes      [place] residence    Living Arrangements  Alone    Available Help at Discharge  Family    Type of Home  Other(Comment)   [place] to enter    Entrance Stairs-Number of Steps  2    Entrance Stairs-Rails  Right;Left   2 [place]  One level    [place] - single point    Additional Comments  used a walker after she hurt her knee      Prior Function   Level of Independence  Independent    Leisure  walking 4 miles with daughter - is down to  about 1/2 mile; shopping, go out to eat, household duties      Sensation   Light Touch  Impaired by gross assessment    Additional Comments  mild numbness R big toe      ROM / Strength   AROM / PROM / Strength  Strength      Strength   Overall Strength  Deficits    Overall Strength Comments  LLE: 4/5; RLE: 4/5 except hip flexion 3+/5      Ambulation/Gait   Ambulation/Gait  Yes    Ambulation/Gait Assistance  5: Supervision    Ambulation Distance (Feet)  100 Feet    Assistive device  Straight cane    Gait Pattern  Step-through pattern;Decreased step length - right;Decreased step length - left;Decreased stride length;Decreased dorsiflexion - right;Decreased dorsiflexion - left;Decreased trunk rotation;Poor foot clearance - left;Poor foot clearance - right    Ambulation Surface  Level;Indoor      Standardized Balance Assessment   Standardized Balance Assessment  Berg Balance Test;Five Times Sit to Stand;10 meter walk test    Five times sit to stand comments   32 seconds    10 Meter Walk  TBA      Berg Balance Test   Sit to Stand  Needs minimal aid to stand or to stabilize    Standing Unsupported  Able to stand 2 minutes with supervision    Sitting with Back Unsupported but Feet Supported on Floor or Stool  Able to sit safely and securely 2 minutes    Stand to Sit  Controls descent by using hands    Transfers  Able to transfer safely, definite need of hands    Standing Unsupported with Eyes Closed  Able to stand 10 seconds with supervision    Standing Ubsupported with Feet Together  Needs help to attain position but able to stand for 30 seconds with feet together    From Standing, Reach Forward with Outstretched Arm  Reaches forward but needs supervision    From Standing Position, Pick up Object from Floor  Able to pick up shoe, needs supervision    From Standing Position, Turn to Look Behind Over each Shoulder  Needs supervision when turning    Turn 360 Degrees  Needs close  supervision or verbal cueing    Standing Unsupported, Alternately Place Feet on Step/Stool  Able to complete >2  steps/needs minimal assist    Standing Unsupported, One Foot in Front  Able to take small step independently and hold 30 seconds    Standing on One Leg  Tries to lift leg/unable to hold 3 seconds but remains standing independently    Total Score  28    Berg comment:  28/56       Patient demonstrates increased fall risk as noted by score of  28/56 on Berg Balance Scale.  (<36= high risk for falls, close to 100%; 37-45 significant >80%; 46-51 moderate >50%; 52-55 lower >25%)           Objective measurements completed on examination: See above findings.              PT Education - 09/03/17 2107    Education Details  clinical findings, PT POC and goals; plan to transition to [facility] due to proximity to patient's home, importance of wearing supportive shoes    Person(s) Educated  Patient;Child(ren)    Methods  Explanation    Comprehension  Verbalized understanding       PT Short Term Goals - 09/03/17 2116      PT SHORT TERM GOAL #1   Title  Pt will be independent with intial HEP and report return to walking program with daughter up to 1 mile.    Time  4    Period  Weeks    Status  New    Target Date  10/03/17      PT SHORT TERM GOAL #2   Title  Pt will improve gait velocity with cane to >/= 2.62 ft/sec    Baseline  TBD    Time  4    Period  Weeks    Status  New    Target Date  10/03/17      PT SHORT TERM GOAL #3   Title  Pt will improve BERG score to >/= 36/56 to decrease falls risk    Baseline  28/56    Time  4    Period  Weeks    Status  New    Target Date  10/03/17      PT SHORT TERM GOAL #4   Title  Pt will improve LE strength as indicated by ability to perform five time sit to stand to </= 25 seconds with supervision and decrease incidence of posterior LOB    Baseline  32 seconds with min A due to 4 LOB posterior    Time  4    Period   Weeks    Status  New    Target Date  10/03/17        PT Long Term Goals - 09/03/17 2121      PT LONG TERM GOAL #1   Title  Pt will be independent with final HEP and will report walking up to 1.5 miles with daughter    Time  8    Period  Weeks    Status  New    Target Date  11/02/17      PT LONG TERM GOAL #2   Title  Pt will improve gait velocity to >/= 3.0 ft/sec with cane    Time  8    Period  Weeks    Status  New    Target Date  11/02/17      PT LONG TERM GOAL #3   Title  Pt will improve BERG to >/= 40/56    Time  8    Period  Weeks    Status  New    Target Date  11/02/17      PT LONG TERM GOAL #4   Title  Pt will improve five time sit to stand to >/= 18 seconds with supervision and decrease use of UE    Time  8    Period  Weeks    Status  New    Target Date  11/02/17      PT LONG TERM GOAL #5   Title  Pt will ambulate x 300' over uneven outdoor surfaces, curb, and negotiate 2 stairs with one rail and cane with supervision     Time  8    Period  Weeks    Status  New    Target Date  11/02/17             Plan - 09/03/17 2110    Clinical Impression Statement  Pt is a 84 year old female referred to Neuro OPPT for evaluation of imbalance and falls.  Pt's PMH is significant for the following: HTN, temporal arteritis, Afib, CKD, MRSA, polymyalgia rheumatic, macular degeneration, osteoporosis, skin cancer, and R TKA.  The following deficits were noted during pt's exam: impaired LE strength, impaired balance, balance reactions and impaired gait with a tendency to fall posterior.  Pt's five time sit to stand and BERG scores indicate pt is at risk for falls. Pt would benefit from skilled PT to address these impairments and functional limitations to maximize functional mobility independence and reduce falls risk.    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      Clinical Decision Making  Moderate    Rehab Potential  Good    PT Frequency  2x / week    PT Duration  8 weeks    PT Treatment/Interventions  ADLs/Self Care Home Management;Aquatic Therapy;DME Instruction;Gait training;Stair training;Functional mobility training;Therapeutic activities;Therapeutic exercise;Balance training;Neuromuscular re-education;Patient/family education    PT Next Visit Plan  assess gait velocity and set goal baseline. gait training with cane, initiate balance and LE strength HEP.  Work on balance reactions (pt falls posterior).  Walking program to return to walking with daughter.      Recommended Other Services  May also benefit from aquatic therapy with [facility] at [facility]??       Patient will benefit from skilled therapeutic intervention in order to improve the following deficits and impairments:  Abnormal gait, Decreased balance, Decreased strength, Difficulty walking  Visit Diagnosis: Repeated falls  Muscle weakness (generalized)  Unsteadiness on feet  Difficulty in walking, not elsewhere classified     Problem List Patient Active Problem List   Diagnosis Date Noted  . Fusion beats 08/03/2017  . Atypical atrial flutter ([place])   . Persistent atrial fibrillation ([place]) 08/19/2016  . On anticoagulant therapy 07/10/2016  . Renal insufficiency 12/07/2015  . PAF (paroxysmal atrial fibrillation) ([place])   . Atrial fibrillation with RVR ([place]) 08/31/2015  . CKD (chronic kidney disease)  stage  3, GFR 30-59 ml/min (HCC) 08/31/2015  . Essential hypertension 07/16/2009  . TEMPORAL ARTERITIS 07/16/2009  . POLYMYALGIA RHEUMATICA 07/16/2009  . MRSA 07/02/2009  . PYOGENIC ARTHRITIS, LOWER LEG 07/02/2009    Rico Junker, PT, DPT 09/03/17    9:27 PM     [facility] [address] [place], [place], [address] Phone: [phone]   Fax:  [phone]  Name: Allison Norman MRN: [id number] Date of Birth: 1924/10/04

## 2017-09-08 DIAGNOSIS — H353211 Exudative age-related macular degeneration, right eye, with active choroidal neovascularization: Secondary | ICD-10-CM | POA: Diagnosis not present

## 2017-09-08 DIAGNOSIS — H353122 Nonexudative age-related macular degeneration, left eye, intermediate dry stage: Secondary | ICD-10-CM | POA: Diagnosis not present

## 2017-09-08 DIAGNOSIS — H35371 Puckering of macula, right eye: Secondary | ICD-10-CM | POA: Diagnosis not present

## 2017-09-08 DIAGNOSIS — H353222 Exudative age-related macular degeneration, left eye, with inactive choroidal neovascularization: Secondary | ICD-10-CM | POA: Diagnosis not present

## 2017-09-08 DIAGNOSIS — H353112 Nonexudative age-related macular degeneration, right eye, intermediate dry stage: Secondary | ICD-10-CM | POA: Diagnosis not present

## 2017-09-15 ENCOUNTER — Telehealth: Payer: Self-pay | Admitting: Internal Medicine

## 2017-09-15 MED ORDER — AMLODIPINE BESYLATE 10 MG PO TABS: 10.0000 mg | ORAL_TABLET | Freq: Every day | ORAL | 11 refills | Status: DC

## 2017-09-15 NOTE — Telephone Encounter (Signed)
New Message       *STAT* If patient is at the pharmacy, call can be transferred to refill team.   1. Which medications need to be refilled? (please list name of each medication and dose if known) amLODipine (NORVASC) 10 MG tablet  2. Which pharmacy/location (including street and city if local pharmacy) is medication to be sent to? [facility], [place]  3. Do they need a 30 day or 90 day supply? 30 day   Patient needs a new rx sent to the pharmacy.

## 2017-09-24 ENCOUNTER — Encounter: Payer: Medicare Other | Admitting: Physical Therapy

## 2017-09-25 ENCOUNTER — Ambulatory Visit: Payer: Medicare Other | Admitting: Internal Medicine

## 2017-09-28 ENCOUNTER — Ambulatory Visit: Payer: Medicare Other | Attending: Family Medicine | Admitting: Physical Therapy

## 2017-09-28 ENCOUNTER — Encounter: Payer: Self-pay | Admitting: Physical Therapy

## 2017-09-28 DIAGNOSIS — M6281 Muscle weakness (generalized): Secondary | ICD-10-CM | POA: Diagnosis not present

## 2017-09-28 DIAGNOSIS — R2681 Unsteadiness on feet: Secondary | ICD-10-CM | POA: Diagnosis not present

## 2017-09-28 DIAGNOSIS — R296 Repeated falls: Secondary | ICD-10-CM | POA: Insufficient documentation

## 2017-09-28 DIAGNOSIS — R262 Difficulty in walking, not elsewhere classified: Secondary | ICD-10-CM | POA: Diagnosis not present

## 2017-09-28 NOTE — Therapy (Signed)
[facility] [address] [place], [place], [address] Phone: [phone]   Fax:  [phone]  Physical Therapy Treatment  Patient Details  Name: Allison Norman MRN: [id number] Date of Birth: 01-05-1925 Referring Provider: [name], MD    Encounter Date: 09/28/2017  PT End of Session - 09/28/17 1438    Visit Number  2    Number of Visits  17    Date for PT Re-Evaluation  11/02/17    Authorization Type  Medicare A and B, BCBS  10th visit PN to MD    PT Start Time  1438    PT Stop Time  1525    PT Time Calculation (min)  47 min    Activity Tolerance  Patient tolerated treatment well    Behavior During Therapy  [facility] for tasks assessed/performed       Past Medical History:  Diagnosis Date  . Atrial fibrillation ([place]) 2017   a. s/p DCCV in 10/2015  b. recurrent in 08/2016 --> rate-control pursued.   . Cancer ([place])    Breast  . CKD (chronic kidney disease)   . GCA (giant cell arteritis) ([place])   . Hypertension   . Macular degeneration   . Osteoporosis   . PMR (polymyalgia rheumatica) (H[facility])   . Skin cancer     Past Surgical History:  Procedure Laterality Date  . CARDIOVERSION N/A 10/18/2015   Procedure: CARDIOVERSION;  Surgeon: Jerline Pain, MD;  Location: [facility];  Service: Cardiovascular;  Laterality: N/A;  . CARDIOVERSION N/A 02/20/2017   Procedure: CARDIOVERSION;  Surgeon: Pixie Casino, MD;  Location: [facility] ENDOSCOPY;  Service: Cardiovascular;  Laterality: N/A;  . CARDIOVERSION N/A 06/18/2017   Procedure: CARDIOVERSION;  Surgeon: Sanda Klein, MD;  Location: [facility] ENDOSCOPY;  Service: Cardiovascular;  Laterality: N/A;  . KNEE SURGERY  2013  . MASTECTOMY  1998   left side    There were no vitals filed for this visit.  Subjective Assessment - 09/28/17 1439    Subjective  Pt has no new complaints. No significant pain to speak of.     Pertinent History  HTN, temporal arteritis, Afib, CKD,  MRSA, polymyalgia rheumatic, macular degeneration, osteoporosis, skin cancer, and R TKA.      Limitations  Standing;Walking    Patient Stated Goals  To improve balance; pt is now fearful of going out of her condo    Currently in Pain?  No/denies    Multiple Pain Sites  No                       [facility] Adult PT Treatment/Exercise - 09/28/17 0001      Knee/Hip Exercises: Aerobic   Nustep  L1 x 6 min   PTA present for status update concurrent            PT Education - 09/28/17 1509    Education Details  Initial HEP for LE strength/balance    Person(s) Educated  Patient;Child(ren)    Methods  Explanation;Demonstration;Verbal cues;Handout    Comprehension  Returned demonstration;Verbalized understanding       PT Short Term Goals - 09/03/17 2116      PT SHORT TERM GOAL #1   Title  Pt will be independent with intial HEP and report return to walking program with daughter up to 1 mile.    Time  4    Period  Weeks    Status  New    Target  Date  10/03/17      PT SHORT TERM GOAL #2   Title  Pt will improve gait velocity with cane to >/= 2.62 ft/sec    Baseline  TBD    Time  4    Period  Weeks    Status  New    Target Date  10/03/17      PT SHORT TERM GOAL #3   Title  Pt will improve BERG score to >/= 36/56 to decrease falls risk    Baseline  28/56    Time  4    Period  Weeks    Status  New    Target Date  10/03/17      PT SHORT TERM GOAL #4   Title  Pt will improve LE strength as indicated by ability to perform five time sit to stand to </= 25 seconds with supervision and decrease incidence of posterior LOB    Baseline  32 seconds with min A due to 4 LOB posterior    Time  4    Period  Weeks    Status  New    Target Date  10/03/17        PT Long Term Goals - 09/03/17 2121      PT LONG TERM GOAL #1   Title  Pt will be independent with final HEP and will report walking up to 1.5 miles with daughter    Time  8    Period  Weeks    Status  New     Target Date  11/02/17      PT LONG TERM GOAL #2   Title  Pt will improve gait velocity to >/= 3.0 ft/sec with cane    Time  8    Period  Weeks    Status  New    Target Date  11/02/17      PT LONG TERM GOAL #3   Title  Pt will improve BERG to >/= 40/56    Time  8    Period  Weeks    Status  New    Target Date  11/02/17      PT LONG TERM GOAL #4   Title  Pt will improve five time sit to stand to >/= 18 seconds with supervision and decrease use of UE    Time  8    Period  Weeks    Status  New    Target Date  11/02/17      PT LONG TERM GOAL #5   Title  Pt will ambulate x 300' over uneven outdoor surfaces, curb, and negotiate 2 stairs with one rail and cane with supervision     Time  8    Period  Weeks    Status  New    Target Date  11/02/17            Plan - 09/28/17 1439    Clinical Impression Statement  Today is pt's first session after eval. She presents with her Mom who is active in her care. Initial HEP was established for LE strength and balance. Pt was fitigued by 6 min on the Nustep. pt was able to walk 2 laps of the gym with her cane and CGA,. Pt takes small steps. Pt improved her sit to stand, could do independently if the blackpad is under her as she is pretty tall. Pt needs VC to get her COG forward.    Rehab Potential  Good    PT  Frequency  2x / week    PT Duration  8 weeks    PT Treatment/Interventions  ADLs/Self Care Home Management;Aquatic Therapy;DME Instruction;Gait training;Stair training;Functional mobility training;Therapeutic activities;Therapeutic exercise;Balance training;Neuromuscular re-education;Patient/family education    PT Next Visit Plan  review HEP, balance, sit to stand    PT Home Exercise Plan  [email or web address]     Consulted and Agree with Plan of Care  Patient       Patient will benefit from skilled therapeutic intervention in order to improve the following deficits and impairments:  Abnormal gait, Decreased balance, Decreased strength,  Difficulty walking  Visit Diagnosis: Repeated falls  Muscle weakness (generalized)  Unsteadiness on feet  Difficulty in walking, not elsewhere classified     Problem List Patient Active Problem List   Diagnosis Date Noted  . Fusion beats 08/03/2017  . Atypical atrial flutter ([place])   . Persistent atrial fibrillation ([place]) 08/19/2016  . On anticoagulant therapy 07/10/2016  . Renal insufficiency 12/07/2015  . PAF (paroxysmal atrial fibrillation) ([place])   . Atrial fibrillation with RVR ([place]) 08/31/2015  . CKD (chronic kidney disease) stage 3, GFR 30-59 ml/min (HCC) 08/31/2015  . Essential hypertension 07/16/2009  . TEMPORAL ARTERITIS 07/16/2009  . POLYMYALGIA RHEUMATICA 07/16/2009  . MRSA 07/02/2009  . PYOGENIC ARTHRITIS, LOWER LEG 07/02/2009    COCHRAN,JENNIFER, PTA 09/28/2017, 3:35 PM  [facility] [address] [place], [place], [address] Phone: [phone]   Fax:  [phone]  Name: Allison Norman MRN: [id number] Date of Birth: 1924-06-09 Access Code: [email or web address]  URL: [email or web address]  Date: 09/28/2017  Prepared by: Myrene Galas   Exercises  Seated Long Arc Quad - 10 reps - 1 sets - 3 hold - 2x daily - 7x weekly  Seated March - 10 reps - 1 sets - 2x daily - 7x weekly  Heel rises with counter support - 10 reps - 1 sets - 1 hold - 2x daily - 7x weekly  Seated Heel Toe Raises - 10 reps - 5x daily - 7x weekly  Standing Hip Abduction with Counter Support - 10 reps - 2x daily - 7x weekly  Sit to Stand - 10 reps - 2x daily - 7x weekly

## 2017-09-30 ENCOUNTER — Other Ambulatory Visit: Payer: Self-pay | Admitting: Family Medicine

## 2017-09-30 DIAGNOSIS — Z1231 Encounter for screening mammogram for malignant neoplasm of breast: Secondary | ICD-10-CM

## 2017-10-01 ENCOUNTER — Ambulatory Visit: Payer: Medicare Other | Admitting: Physical Therapy

## 2017-10-05 ENCOUNTER — Encounter: Payer: Self-pay | Admitting: Physical Therapy

## 2017-10-05 ENCOUNTER — Ambulatory Visit: Payer: Medicare Other | Admitting: Physical Therapy

## 2017-10-05 DIAGNOSIS — R262 Difficulty in walking, not elsewhere classified: Secondary | ICD-10-CM | POA: Diagnosis not present

## 2017-10-05 DIAGNOSIS — M6281 Muscle weakness (generalized): Secondary | ICD-10-CM | POA: Diagnosis not present

## 2017-10-05 DIAGNOSIS — R296 Repeated falls: Secondary | ICD-10-CM | POA: Diagnosis not present

## 2017-10-05 DIAGNOSIS — R2681 Unsteadiness on feet: Secondary | ICD-10-CM | POA: Diagnosis not present

## 2017-10-05 NOTE — Therapy (Signed)
[facility] [address], [place], [place], [address] Phone: [phone]   Fax:  [phone]  Physical Therapy Treatment  Patient Details  Name: Allison Norman MRN: [id number] Date of Birth: August 28, 1924 Referring Provider (PT): [name], MD    Encounter Date: 10/05/2017  PT End of Session - 10/05/17 1443    Visit Number  3    Number of Visits  17    Date for PT Re-Evaluation  11/02/17    Authorization Type  Medicare A and B, BCBS  10th visit PN to MD    PT Start Time  1442    PT Stop Time  1523    PT Time Calculation (min)  41 min    Activity Tolerance  Patient tolerated treatment well    Behavior During Therapy  [facility] for tasks assessed/performed       Past Medical History:  Diagnosis Date  . Atrial fibrillation ([place]) 2017   a. s/p DCCV in 10/2015  b. recurrent in 08/2016 --> rate-control pursued.   . Cancer ([place])    Breast  . CKD (chronic kidney disease)   . GCA (giant cell arteritis) ([place])   . Hypertension   . Macular degeneration   . Osteoporosis   . PMR (polymyalgia rheumatica) (HCC)   . Skin cancer     Past Surgical History:  Procedure Laterality Date  . CARDIOVERSION N/A 10/18/2015   Procedure: CARDIOVERSION;  Surgeon: Jerline Pain, MD;  Location: [place];  Service: Cardiovascular;  Laterality: N/A;  . CARDIOVERSION N/A 02/20/2017   Procedure: CARDIOVERSION;  Surgeon: Pixie Casino, MD;  Location: [facility] ENDOSCOPY;  Service: Cardiovascular;  Laterality: N/A;  . CARDIOVERSION N/A 06/18/2017   Procedure: CARDIOVERSION;  Surgeon: Sanda Klein, MD;  Location: [place] ENDOSCOPY;  Service: Cardiovascular;  Laterality: N/A;  . KNEE SURGERY  2013  . MASTECTOMY  1998   left side    There were no vitals filed for this visit.  Subjective Assessment - 10/05/17 1448    Subjective  Pt drove herself to therapy today. She feels pretty good. Doingher exercises. I was pretty tired after last time, I  even went to bed at 8.    Pertinent History  HTN, temporal arteritis, Afib, CKD, MRSA, polymyalgia rheumatic, macular degeneration, osteoporosis, skin cancer, and R TKA.      Limitations  Standing;Walking    Currently in Pain?  No/denies    Multiple Pain Sites  No                       [facility] Adult PT Treatment/Exercise - 10/05/17 0001      Ambulation/Gait   Ambulation Distance (Feet)  --   30 feet 4x with CGA   Assistive device  Straight cane    Ambulation Surface  Level;Indoor    Gait Comments  Worked on increasing step length,       Neuro Re-ed    Neuro Re-ed Details   Small step tandem stance bil 2x 10-15 sec CGA   No UE, NArrow BOS stance x 1 min, No UE     Knee/Hip Exercises: Aerobic   Nustep  L1 x 7 min   PTA present to monitor pt's LE fatigue.     Knee/Hip Exercises: Standing   Heel Raises  Both;1 set;10 reps    Hip Abduction  Stengthening;Both;1 set;10 reps;Knee straight    SLS  --   3x bil as long as pt can:  varying use of hands     Knee/Hip Exercises: Seated   Long Arc Quad  AROM;Strengthening;Both;1 set;10 reps;Weights    Long Arc Quad Weight  2 lbs.    Ball Squeeze  20x    Clamshell with TheraBand  Red   20x   Knee/Hip Flexion  10x bil 2# weights    Sit to Sand  1 set;10 reps;without UE support   Sitting on black pad              PT Short Term Goals - 10/05/17 1452      PT SHORT TERM GOAL #1   Title  Pt will be independent with intial HEP and report return to walking program with daughter up to 1 mile.    Time  4    Period  Weeks    Status  Achieved   She isn't sure she wants to walk that distance again.        PT Long Term Goals - 09/03/17 2121      PT LONG TERM GOAL #1   Title  Pt will be independent with final HEP and will report walking up to 1.5 miles with daughter    Time  8    Period  Weeks    Status  New    Target Date  11/02/17      PT LONG TERM GOAL #2   Title  Pt will improve gait velocity to >/= 3.0 ft/sec  with cane    Time  8    Period  Weeks    Status  New    Target Date  11/02/17      PT LONG TERM GOAL #3   Title  Pt will improve BERG to >/= 40/56    Time  8    Period  Weeks    Status  New    Target Date  11/02/17      PT LONG TERM GOAL #4   Title  Pt will improve five time sit to stand to >/= 18 seconds with supervision and decrease use of UE    Time  8    Period  Weeks    Status  New    Target Date  11/02/17      PT LONG TERM GOAL #5   Title  Pt will ambulate x 300' over uneven outdoor surfaces, curb, and negotiate 2 stairs with one rail and cane with supervision     Time  8    Period  Weeks    Status  New    Target Date  11/02/17            Plan - 10/05/17 1444    Clinical Impression Statement  Pt compliant with her initial HEP, sit to stand is her hardest. She can do fairly well sitting higher on the black pad. Mostly verbal cues to get her COG more forward.  She showed improvement with herbalance as she practiced her single leg stance and small step tandem stance. not quite safe enough to do on her own for HEP.  Gait was worked on towards the end of session so her LE were alreay quite fatigued so she tended t ohave wider BOS tha nwhen she walked in to pT today. Step length is improving.     Rehab Potential  Good    PT Frequency  2x / week    PT Duration  8 weeks    PT Treatment/Interventions  ADLs/Self Care Home Management;Aquatic Therapy;DME Instruction;Gait  training;Stair training;Functional mobility training;Therapeutic activities;Therapeutic exercise;Balance training;Neuromuscular re-education;Patient/family education    PT Next Visit Plan   HEP, balance, sit to stand    PT Home Exercise Plan  [email or web address]     Consulted and Agree with Plan of Care  Patient       Patient will benefit from skilled therapeutic intervention in order to improve the following deficits and impairments:  Abnormal gait, Decreased balance, Decreased strength, Difficulty walking  Visit  Diagnosis: Repeated falls  Muscle weakness (generalized)  Unsteadiness on feet  Difficulty in walking, not elsewhere classified     Problem List Patient Active Problem List   Diagnosis Date Noted  . Fusion beats 08/03/2017  . Atypical atrial flutter ([place])   . Persistent atrial fibrillation ([place]) 08/19/2016  . On anticoagulant therapy 07/10/2016  . Renal insufficiency 12/07/2015  . PAF (paroxysmal atrial fibrillation) ([place])   . Atrial fibrillation with RVR ([place]) 08/31/2015  . CKD (chronic kidney disease) stage 3, GFR 30-59 ml/min (HCC) 08/31/2015  . Essential hypertension 07/16/2009  . TEMPORAL ARTERITIS 07/16/2009  . POLYMYALGIA RHEUMATICA 07/16/2009  . MRSA 07/02/2009  . PYOGENIC ARTHRITIS, LOWER LEG 07/02/2009    Osvaldo Lamping, PTA 10/05/2017, 3:17 PM  [facility] [address], [place], [place], [address] Phone: [phone]   Fax:  [phone]  Name: Allison Norman MRN: [id number] Date of Birth: 10/02/1924

## 2017-10-06 ENCOUNTER — Other Ambulatory Visit: Payer: Self-pay | Admitting: Family Medicine

## 2017-10-06 DIAGNOSIS — N63 Unspecified lump in unspecified breast: Secondary | ICD-10-CM

## 2017-10-07 ENCOUNTER — Encounter: Payer: Self-pay | Admitting: Physical Therapy

## 2017-10-07 ENCOUNTER — Ambulatory Visit: Payer: Medicare Other | Attending: Family Medicine | Admitting: Physical Therapy

## 2017-10-07 DIAGNOSIS — M6281 Muscle weakness (generalized): Secondary | ICD-10-CM | POA: Diagnosis not present

## 2017-10-07 DIAGNOSIS — R262 Difficulty in walking, not elsewhere classified: Secondary | ICD-10-CM | POA: Insufficient documentation

## 2017-10-07 DIAGNOSIS — R296 Repeated falls: Secondary | ICD-10-CM | POA: Diagnosis not present

## 2017-10-07 DIAGNOSIS — R2681 Unsteadiness on feet: Secondary | ICD-10-CM | POA: Insufficient documentation

## 2017-10-07 NOTE — Therapy (Signed)
[facility] [address] [place], [place], [address] Phone: [phone]   Fax:  [phone]  Physical Therapy Treatment  Patient Details  Name: Allison Norman MRN: [id number] Date of Birth: 05-19-1924 Referring Provider (PT): [name], MD    Encounter Date: 10/07/2017  PT End of Session - 10/07/17 1319    Visit Number  4    Number of Visits  18    Date for PT Re-Evaluation  11/02/17    Authorization Type  Medicare A and B, BCBS  10th visit PN to MD    PT Start Time  1232    PT Stop Time  1315    PT Time Calculation (min)  43 min    Activity Tolerance  Patient tolerated treatment well    Behavior During Therapy  [facility] for tasks assessed/performed       Past Medical History:  Diagnosis Date  . Atrial fibrillation ([place]) 2017   a. s/p DCCV in 10/2015  b. recurrent in 08/2016 --> rate-control pursued.   . Cancer ([place])    Breast  . CKD (chronic kidney disease)   . GCA (giant cell arteritis) ([place])   . Hypertension   . Macular degeneration   . Osteoporosis   . PMR (polymyalgia rheumatica) (HCC)   . Skin cancer     Past Surgical History:  Procedure Laterality Date  . CARDIOVERSION N/A 10/18/2015   Procedure: CARDIOVERSION;  Surgeon: Jerline Pain, MD;  Location: [place];  Service: Cardiovascular;  Laterality: N/A;  . CARDIOVERSION N/A 02/20/2017   Procedure: CARDIOVERSION;  Surgeon: Pixie Casino, MD;  Location: [facility] ENDOSCOPY;  Service: Cardiovascular;  Laterality: N/A;  . CARDIOVERSION N/A 06/18/2017   Procedure: CARDIOVERSION;  Surgeon: Sanda Klein, MD;  Location: [place] ENDOSCOPY;  Service: Cardiovascular;  Laterality: N/A;  . KNEE SURGERY  2013  . MASTECTOMY  1998   left side    There were no vitals filed for this visit.  Subjective Assessment - 10/07/17 1231    Subjective  Pt reported she was tired after last visit from the exercises.  She went to bed earlier than usual due to fatigue but  felt recovered by the next morning.    Pertinent History  HTN, temporal arteritis, Afib, CKD, MRSA, polymyalgia rheumatic, macular degeneration, osteoporosis, skin cancer, and R TKA.      Limitations  Standing;Walking    Patient Stated Goals  To improve balance; pt is now fearful of going out of her condo    Currently in Pain?  No/denies         [facility] PT Assessment - 10/07/17 0001      Transfers   Transfers  Sit to Stand    Sit to Stand  --   Pt unable to stand with bil UE from 18" chair w/out momentum                  [facility] Adult PT Treatment/Exercise - 10/07/17 0001      Exercises   Exercises  Knee/Hip;Lumbar      Lumbar Exercises: Standing   Functional Squats  10 reps    Functional Squats Limitations  needs elevated table and uses bil UEs   PT cued to get COG forward and prep glut set before standing   Other Standing Lumbar Exercises  ambulation with cane and stand by assist x 80 feet   PT cued for increased step length     Lumbar Exercises: Supine  Glut Set  10 reps;5 seconds    Bridge  10 reps    Large Ball Abdominal Isometric  10 reps;5 seconds    Large Ball Abdominal Isometric Limitations  shoulder press      Knee/Hip Exercises: Seated   Long Arc Quad  2 sets;AROM;Strengthening;Both    Long Arc Quad Weight  2 lbs.    Ball Squeeze  20x    Clamshell with TheraBand  Red   20x   Other Seated Knee/Hip Exercises  heel/toe raises x 20, 2 sets    Marching  Strengthening;Right;Left;2 sets;20 reps    Marching Weights  2 lbs.             PT Education - 10/07/17 1317    Education Details  Access Code: [email or web address], added glut sets in supine hooklying    Person(s) Educated  Patient    Methods  Explanation;Handout    Comprehension  Verbalized understanding;Returned demonstration       PT Short Term Goals - 10/05/17 1452      PT SHORT TERM GOAL #1   Title  Pt will be independent with intial HEP and report return to walking program with daughter up to 1  mile.    Time  4    Period  Weeks    Status  Achieved   She isn't sure she wants to walk that distance again.        PT Long Term Goals - 09/03/17 2121      PT LONG TERM GOAL #1   Title  Pt will be independent with final HEP and will report walking up to 1.5 miles with daughter    Time  8    Period  Weeks    Status  New    Target Date  11/02/17      PT LONG TERM GOAL #2   Title  Pt will improve gait velocity to >/= 3.0 ft/sec with cane    Time  8    Period  Weeks    Status  New    Target Date  11/02/17      PT LONG TERM GOAL #3   Title  Pt will improve BERG to >/= 40/56    Time  8    Period  Weeks    Status  New    Target Date  11/02/17      PT LONG TERM GOAL #4   Title  Pt will improve five time sit to stand to >/= 18 seconds with supervision and decrease use of UE    Time  8    Period  Weeks    Status  New    Target Date  11/02/17      PT LONG TERM GOAL #5   Title  Pt will ambulate x 300' over uneven outdoor surfaces, curb, and negotiate 2 stairs with one rail and cane with supervision     Time  8    Period  Weeks    Status  New    Target Date  11/02/17            Plan - 10/07/17 1320    Clinical Impression Statement  Pt displays functional weakness in bil LEs causing her to be unable to perform sit to stand from 18" height without max use of bil UEs.  She displayed inconsistent balance/stability upon standing from elevated table today.  PT provided verbal and tactile cues to get COG forward and to cue glut set before standing  which allowed her to stand without using momentum.  PT provided mod-max cueing for ab set during all seated and supine exercises to increase her postural and core awareness.  She will continue to benefit from skilled PT to improve her strength and balance for safer independence during daily tasks.       Patient will benefit from skilled therapeutic intervention in order to improve the following deficits and impairments:     Visit  Diagnosis: Repeated falls  Muscle weakness (generalized)  Unsteadiness on feet  Difficulty in walking, not elsewhere classified     Problem List Patient Active Problem List   Diagnosis Date Noted  . Fusion beats 08/03/2017  . Atypical atrial flutter ([place])   . Persistent atrial fibrillation 08/19/2016  . On anticoagulant therapy 07/10/2016  . Renal insufficiency 12/07/2015  . PAF (paroxysmal atrial fibrillation) ([place])   . Atrial fibrillation with RVR ([place]) 08/31/2015  . CKD (chronic kidney disease) stage 3, GFR 30-59 ml/min (HCC) 08/31/2015  . Essential hypertension 07/16/2009  . TEMPORAL ARTERITIS 07/16/2009  . POLYMYALGIA RHEUMATICA 07/16/2009  . MRSA 07/02/2009  . PYOGENIC ARTHRITIS, LOWER LEG 07/02/2009   Baruch Merl, PT 10/07/17 1:27 PM     [facility] [address], [place], [place], [address] Phone: [phone]   Fax:  [phone]  Name: MATIE DIMAANO MRN: [id number] Date of Birth: [birth year]

## 2017-10-07 NOTE — Patient Instructions (Signed)
Access Code: [email or web address]  URL: [email or web address]  Date: 10/07/2017  Prepared by: Venetia Night Kaelei Wheeler   Exercises  Seated Long Arc Quad - 10 reps - 1 sets - 3 hold - 2x daily - 7x weekly  Seated March - 10 reps - 1 sets - 2x daily - 7x weekly  Heel rises with counter support - 10 reps - 1 sets - 1 hold - 2x daily - 7x weekly  Seated Heel Toe Raises - 10 reps - 5x daily - 7x weekly  Standing Hip Abduction with Counter Support - 10 reps - 2x daily - 7x weekly  Sit to Stand - 10 reps - 2x daily - 7x weekly  Hooklying Gluteal Sets - 10 reps - 1 sets - 5 hold - 1x daily - 7x weekly

## 2017-10-08 ENCOUNTER — Encounter: Payer: Self-pay | Admitting: Internal Medicine

## 2017-10-08 ENCOUNTER — Ambulatory Visit (INDEPENDENT_AMBULATORY_CARE_PROVIDER_SITE_OTHER): Payer: Medicare Other | Admitting: Internal Medicine

## 2017-10-08 VITALS — BP 140/60 | HR 71 | Ht 66.0 in | Wt 152.9 lb

## 2017-10-08 DIAGNOSIS — I10 Essential (primary) hypertension: Secondary | ICD-10-CM | POA: Diagnosis not present

## 2017-10-08 DIAGNOSIS — I480 Paroxysmal atrial fibrillation: Secondary | ICD-10-CM

## 2017-10-08 NOTE — Progress Notes (Signed)
OFFICE NOTE  Chief Complaint:  Follow-up A. fib  Primary Care Physician: [name], MD  HPI:  Allison Norman is a 84 y.o. female who is coming referred to me for new onset atrial fibrillation. Recently Allison Norman had noted that her heart was fluttering somewhat when she laid on her left side. She presented for evaluation of leg swelling and was noted to have an irregular heartbeat. EKG was performed which showed a new onset atrial fibrillation. She has had somewhat elevated ventricular responses from the 90s to 120s. She was a ready on the metoprolol as well as diltiazem and was placed on Eliquis 2.5 mg twice daily. This is based on age greater than 41 and creatinine of 1.57. Allison Norman is been on this dose for 2 days and has had some bleeding problems. She has a history of thin skin and was on steroids for PMR. She had some bleeding over her left forearm which was dressed today. She denies any chest pain or worsening shortness of breath. 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  10/11/2015  Allison Norman returns today for follow-up. She remains in atrial fibrillation with controlled ventricular response. I had increased her diltiazem however this apparently dropped her blood pressure. She was also on losartan. She did not feel well with a low blood pressure and decrease the dose back to her original dose. She's been compliant with her anticoagulation and has been anticoagulated for close to 4 weeks. We discussed elective cardioversion and she is willing to proceed.  12/05/2015  Allison Norman was seen today in follow-up. She had successful cardioversion to sinus rhythm and notes a marked improvement in her energy level. She did not realize how much it was affecting her. She had some confusion as to whether or not she needs to stay on anticoagulation, however I mentioned that should be a lifetime medication for  her. She is maintaining sinus rhythm today at 78.  07/10/2016  Allison Norman returns for follow-up. She is doing well back in sinus rhythm. She is on a was for CHADSVASC score 4. She says she feels markedly better in sinus rhythm and therefore she should be able to monitor for symptoms and recurrence. Blood pressure is at goal today. Initially was elevated however recheck came down to 140/74. She has had some leg edema in the past which is basically resolved.  11/25/2016  Allison Norman returns today for follow-up of her A. fib.  Recently she saw [place], PA-C, who noted she had maintained sinus rhythm however heart rate was elevated and her Cardizem was increased from 180 mg to 240 mg daily for better rate control.  She says she felt worse with this change and thought that her blood pressure was too low, therefore she decreased the dose back to her home dose.  Blood pressure appears to be well controlled today.  08/03/2017  Allison Norman was seen today in follow-up.  Blood pressure is noted to be elevated today.  She was recently seen by Roderic Palau, NP in the A. fib clinic.  She has been successfully loaded on amiodarone after recent cardioversion and seems to be maintaining sinus rhythm.  Her Toprol was reduced and eventually discontinued.  She is on Cardizem and Eliquis.  Blood pressure is now higher.  She said when she was on a beta-blocker she felt like her blood pressure was better controlled.  Today she  is reporting palpitations.  She notes that she has some missed heartbeats every so often.  EKG shows what appear to be sinus rhythm and fusion beats.  10/08/2017  Allison Norman is seen today in follow-up.  When I last saw her we adjusted her blood pressure medications and her blood pressures now is much better controlled.  She is maintaining sinus rhythm with PACs on amiodarone.  She denies any breakthrough A. fib.  Unfortunately she believes she broke her fifth toe on the left foot last night.   She walked into a chair and has had significant pain and bruising.   PMHx:  Past Medical History:  Diagnosis Date  . Atrial fibrillation ([place]) 2017   a. s/p DCCV in 10/2015  b. recurrent in 08/2016 --> rate-control pursued.   . Cancer ([place])    Breast  . CKD (chronic kidney disease)   . GCA (giant cell arteritis) ([place])   . Hypertension   . Macular degeneration   . Osteoporosis   . PMR (polymyalgia rheumatica) (HCC)   . Skin cancer     Past Surgical History:  Procedure Laterality Date  . CARDIOVERSION N/A 10/18/2015   Procedure: CARDIOVERSION;  Surgeon: Jerline Pain, MD;  Location: [facility];  Service: Cardiovascular;  Laterality: N/A;  . CARDIOVERSION N/A 02/20/2017   Procedure: CARDIOVERSION;  Surgeon: Pixie Casino, MD;  Location: [facility] ENDOSCOPY;  Service: Cardiovascular;  Laterality: N/A;  . CARDIOVERSION N/A 06/18/2017   Procedure: CARDIOVERSION;  Surgeon: Sanda Klein, MD;  Location: [facility] ENDOSCOPY;  Service: Cardiovascular;  Laterality: N/A;  . KNEE SURGERY  2013  . MASTECTOMY  1998   left side    FAMHx:  Family History  Problem Relation Age of Onset  . Stroke Mother   . Kidney failure Father        died at 39  . Heart disease Sister        died at 34  . Heart disease Sister        died at 61  . Breast cancer Daughter     SOCHx:   reports that she quit smoking about 51 years ago. She has never used smokeless tobacco. She reports that she does not drink alcohol or use drugs.  ALLERGIES:  Allergies  Allergen Reactions  . Codeine Itching  . Penicillins Itching, Rash and Other (See Comments)    Has patient had a PCN reaction causing immediate rash, facial/tongue/throat swelling, SOB or lightheadedness with hypotension: Yes Has patient had a PCN reaction causing severe rash involving mucus membranes or skin necrosis: No Has patient had a PCN reaction that required hospitalization: No Has patient had a PCN reaction occurring within the last 10 years: No If  all of the above answers are "NO", then may proceed with Cephalosporin use.    ROS: Pertinent items noted in HPI and remainder of comprehensive ROS otherwise negative.  HOME MEDS: Current Outpatient Medications  Medication Sig Dispense Refill  . amiodarone (PACERONE) 200 MG tablet Take 1 tablet (200 mg total) by mouth daily. 90 tablet 3  . amLODipine (NORVASC) 10 MG tablet Take 1 tablet (10 mg total) by mouth daily. 30 tablet 11  . Bevacizumab (AVASTIN IV) Eye injection every 6 weeks    . cloNIDine (CATAPRES) 0.2 MG tablet TAKE 1 TABLET BY MOUTH THREE TIMES DAILY 270 tablet 1  . diazepam (VALIUM) 2 MG tablet Take 2 mg by mouth at bedtime as needed (for sleep).   0  . ELIQUIS 2.5 MG TABS  tablet TAKE 1 TABLET BY MOUTH TWICE DAILY 180 tablet 1  . furosemide (LASIX) 80 MG tablet Take 0.5 tablets (40 mg total) by mouth daily. Per PCP    . losartan (COZAAR) 100 MG tablet Take 100 mg by mouth daily. Per PCP     . metoprolol succinate (TOPROL-XL) 25 MG 24 hr tablet Take 0.5 tablets (12.5 mg total) by mouth daily. 15 tablet 11  . Multiple Vitamins-Minerals (PRESERVISION AREDS 2) CAPS Take 1 capsule by mouth daily.    . predniSONE (DELTASONE) 5 MG tablet Take 5 mg by mouth daily.  0  . pyridoxine (B-6) 100 MG tablet Take 100 mg by mouth daily.     No current facility-administered medications for this visit.     LABS/IMAGING: No results found for this or any previous visit (from the past 48 hour(s)). No results found.  WEIGHTS: Wt Readings from Last 3 Encounters:  10/08/17 152 lb 14.4 oz (69.4 kg)  08/03/17 150 lb 9.6 oz (68.3 kg)  07/07/17 151 lb (68.5 kg)    VITALS: BP 140/60   Pulse 71   Ht 5\' 6"  (1.676 m)   Wt 152 lb 14.4 oz (69.4 kg)   BMI 24.68 kg/m   EXAM: General appearance: alert and no distress Neck: no carotid bruit, no JVD and thyroid not enlarged, symmetric, no tenderness/mass/nodules Lungs: clear to auscultation bilaterally Heart: regular rate and rhythm, S1, S2  normal, no murmur, click, rub or gallop Abdomen: soft, non-tender; bowel sounds normal; no masses,  no organomegaly Extremities: No edema, ecchymosis and point tenderness over the left fifth metatarsal Pulses: 2+ and symmetric Skin: Skin color, texture, turgor normal. No rashes or lesions Neurologic: Grossly normal Psych: Pleasant  EKG: Sinus rhythm with first-degree AV block and PACs at 71-personally reviewed  ASSESSMENT: 1. New onset A. fib-CHADSVASC score of 4 - s/p successful DCCV (10/2015) - on Eliquis 2. Hypertension 3. Leg edema 4. Probable left fifth metatarsal fracture  PLAN: 1.  Allison Norman has had improvement in blood pressure and good heart rate control with adjustment in her medications.  She is tolerating them fine.  Her edema has improved.  Unfortunately she has pain and significant ecchymosis over the left fifth metatarsal and likely fractured this.  I did tape this to the fourth toe and unfortunately there are no real surgical options for that.  Plan follow-up with me in 6 months or sooner as necessary.  Pixie Casino, MD, [facility], [place] Director of the Advanced Lipid Disorders &  Cardiovascular Risk Reduction Clinic Attending Cardiologist  Direct Dial: [phone]  Fax: [phone]  Website:  [email or web address] 10/08/2017, 3:18 PM

## 2017-10-08 NOTE — Patient Instructions (Signed)
Your physician wants you to follow-up in: 6 months with Dr. Hilty. You will receive a reminder letter in the mail two months in advance. If you don't receive a letter, please call our office to schedule the follow-up appointment.    

## 2017-10-12 ENCOUNTER — Ambulatory Visit: Payer: Medicare Other | Admitting: Physical Therapy

## 2017-10-12 ENCOUNTER — Encounter: Payer: Self-pay | Admitting: Physical Therapy

## 2017-10-12 DIAGNOSIS — R296 Repeated falls: Secondary | ICD-10-CM

## 2017-10-12 DIAGNOSIS — R2681 Unsteadiness on feet: Secondary | ICD-10-CM | POA: Diagnosis not present

## 2017-10-12 DIAGNOSIS — R262 Difficulty in walking, not elsewhere classified: Secondary | ICD-10-CM

## 2017-10-12 DIAGNOSIS — M6281 Muscle weakness (generalized): Secondary | ICD-10-CM | POA: Diagnosis not present

## 2017-10-12 NOTE — Therapy (Signed)
[facility] [address] [place], [place], [address] Phone: [phone]   Fax:  [phone]  Physical Therapy Treatment  Patient Details  Name: Allison Norman MRN: [id number] Date of Birth: 08/19/1924 Referring Provider (PT): [name], MD    Encounter Date: 10/12/2017  PT End of Session - 10/12/17 1444    Visit Number  5    Number of Visits  18    Date for PT Re-Evaluation  11/02/17    Authorization Type  Medicare A and B, BCBS  10th visit PN to MD    PT Start Time  1401    PT Stop Time  1446    PT Time Calculation (min)  45 min    Activity Tolerance  Patient tolerated treatment well    Behavior During Therapy  [facility] for tasks assessed/performed       Past Medical History:  Diagnosis Date  . Atrial fibrillation ([place]) 2017   a. s/p DCCV in 10/2015  b. recurrent in 08/2016 --> rate-control pursued.   . Cancer ([place])    Breast  . CKD (chronic kidney disease)   . GCA (giant cell arteritis) ([place])   . Hypertension   . Macular degeneration   . Osteoporosis   . PMR (polymyalgia rheumatica) (HCC)   . Skin cancer     Past Surgical History:  Procedure Laterality Date  . CARDIOVERSION N/A 10/18/2015   Procedure: CARDIOVERSION;  Surgeon: Jerline Pain, MD;  Location: [place];  Service: Cardiovascular;  Laterality: N/A;  . CARDIOVERSION N/A 02/20/2017   Procedure: CARDIOVERSION;  Surgeon: Pixie Casino, MD;  Location: [facility] ENDOSCOPY;  Service: Cardiovascular;  Laterality: N/A;  . CARDIOVERSION N/A 06/18/2017   Procedure: CARDIOVERSION;  Surgeon: Sanda Klein, MD;  Location: [place] ENDOSCOPY;  Service: Cardiovascular;  Laterality: N/A;  . KNEE SURGERY  2013  . MASTECTOMY  1998   left side    There were no vitals filed for this visit.  Subjective Assessment - 10/12/17 1358    Subjective  Pt stated she did better after last visit - wasn't as worn out.  May have broken her toe the other day (pinky toe on  Lt foot) but it feels better now.    Pertinent History  HTN, temporal arteritis, Afib, CKD, MRSA, polymyalgia rheumatic, macular degeneration, osteoporosis, skin cancer, and R TKA.      Limitations  Standing;Walking    Patient Stated Goals  To improve balance; pt is now fearful of going out of her condo    Currently in Pain?  No/denies                       [facility] Adult PT Treatment/Exercise - 10/12/17 0001      Exercises   Exercises  Knee/Hip;Ankle;Lumbar;Shoulder      Lumbar Exercises: Standing   Functional Squats  15 reps    Functional Squats Limitations  progressively lower surface to stand from    lowest surface 24", PT cued Pt to tuck feet under/fold hips   Other Standing Lumbar Exercises  split stance single UE support balance 3x20 sec each, split stance single UE support manual resistance trunk pertebations by PT all directions x 2' each     Other Standing Lumbar Exercises  toe raises with back near wall for fall prevention strategy x 10 reps      Knee/Hip Exercises: Standing   Heel Raises  Both;1 set;10 reps    Heel Raises  Limitations  with toe raises alternating     Hip Flexion  Stengthening   2# ankle weights   Hip Flexion Limitations  marching toe taps 4" riser with bil UE support x 20    Hip Abduction  Stengthening;Both;1 set;10 reps;Knee straight    Abduction Limitations  2# ankle weights      Knee/Hip Exercises: Seated   Long Arc Quad  2 sets;AROM;Strengthening;Both   15 reps   Long Arc Quad Weight  2 lbs.    Other Seated Knee/Hip Exercises  heel/toe raises x 20, 2 sets    Marching  Strengthening;Right;Left;2 sets;20 reps    Marching Weights  2 lbs.      Shoulder Exercises: Seated   Row  Strengthening;Both;15 reps    Theraband Level (Shoulder Row)  Level 2 (Red)    External Rotation  Strengthening;Both;15 reps    Theraband Level (Shoulder External Rotation)  Level 2 (Red)               PT Short Term Goals - 10/05/17 1452      PT  SHORT TERM GOAL #1   Title  Pt will be independent with intial HEP and report return to walking program with daughter up to 1 mile.    Time  4    Period  Weeks    Status  Achieved   She isn't sure she wants to walk that distance again.        PT Long Term Goals - 09/03/17 2121      PT LONG TERM GOAL #1   Title  Pt will be independent with final HEP and will report walking up to 1.5 miles with daughter    Time  8    Period  Weeks    Status  New    Target Date  11/02/17      PT LONG TERM GOAL #2   Title  Pt will improve gait velocity to >/= 3.0 ft/sec with cane    Time  8    Period  Weeks    Status  New    Target Date  11/02/17      PT LONG TERM GOAL #3   Title  Pt will improve BERG to >/= 40/56    Time  8    Period  Weeks    Status  New    Target Date  11/02/17      PT LONG TERM GOAL #4   Title  Pt will improve five time sit to stand to >/= 18 seconds with supervision and decrease use of UE    Time  8    Period  Weeks    Status  New    Target Date  11/02/17      PT LONG TERM GOAL #5   Title  Pt will ambulate x 300' over uneven outdoor surfaces, curb, and negotiate 2 stairs with one rail and cane with supervision     Time  8    Period  Weeks    Status  New    Target Date  11/02/17            Plan - 10/12/17 1445    Clinical Impression Statement  Pt able to perform sit to stand without UEs starting from elevated table down to 24" height.  PT provided VCs for Pt to use hip hinge to get COG over feet before standing.  Pt has occasional balance loss upon standing and required stand by assist.  She  also required unilateral UE support during split stance timed balance activities today due to poor balance.  She will continue to benefit from skilled progression of ther ex, balance, gait and functional tasks to improve functional independence and decrease fall risk.    Rehab Potential  Good    PT Frequency  2x / week    PT Duration  8 weeks    PT  Treatment/Interventions  ADLs/Self Care Home Management;Aquatic Therapy;DME Instruction;Gait training;Stair training;Functional mobility training;Therapeutic activities;Therapeutic exercise;Balance training;Neuromuscular re-education;Patient/family education    PT Next Visit Plan   HEP, balance, sit to stand    PT Home Exercise Plan  [id number]     Consulted and Agree with Plan of Care  Patient       Patient will benefit from skilled therapeutic intervention in order to improve the following deficits and impairments:  Abnormal gait, Decreased balance, Decreased strength, Difficulty walking  Visit Diagnosis: Repeated falls  Muscle weakness (generalized)  Unsteadiness on feet  Difficulty in walking, not elsewhere classified     Problem List Patient Active Problem List   Diagnosis Date Noted  . Fusion beats 08/03/2017  . Atypical atrial flutter ([place])   . Persistent atrial fibrillation 08/19/2016  . On anticoagulant therapy 07/10/2016  . Renal insufficiency 12/07/2015  . PAF (paroxysmal atrial fibrillation) ([place])   . Atrial fibrillation with RVR ([place]) 08/31/2015  . CKD (chronic kidney disease) stage 3, GFR 30-59 ml/min (HCC) 08/31/2015  . Essential hypertension 07/16/2009  . TEMPORAL ARTERITIS 07/16/2009  . POLYMYALGIA RHEUMATICA 07/16/2009  . MRSA 07/02/2009  . PYOGENIC ARTHRITIS, LOWER LEG 07/02/2009   Baruch Merl, PT 10/12/17 2:47 PM    [facility] [address] [place], [place], [address] Phone: [phone]   Fax:  [phone]  Name: Allison Norman MRN: [id number] Date of Birth: 09-Jun-1924

## 2017-10-13 DIAGNOSIS — H353211 Exudative age-related macular degeneration, right eye, with active choroidal neovascularization: Secondary | ICD-10-CM | POA: Diagnosis not present

## 2017-10-13 DIAGNOSIS — H353222 Exudative age-related macular degeneration, left eye, with inactive choroidal neovascularization: Secondary | ICD-10-CM | POA: Diagnosis not present

## 2017-10-13 DIAGNOSIS — H353112 Nonexudative age-related macular degeneration, right eye, intermediate dry stage: Secondary | ICD-10-CM | POA: Diagnosis not present

## 2017-10-13 DIAGNOSIS — H353122 Nonexudative age-related macular degeneration, left eye, intermediate dry stage: Secondary | ICD-10-CM | POA: Diagnosis not present

## 2017-10-14 ENCOUNTER — Encounter: Payer: Self-pay | Admitting: Physical Therapy

## 2017-10-14 ENCOUNTER — Ambulatory Visit: Payer: Medicare Other | Admitting: Physical Therapy

## 2017-10-14 DIAGNOSIS — R2681 Unsteadiness on feet: Secondary | ICD-10-CM

## 2017-10-14 DIAGNOSIS — R296 Repeated falls: Secondary | ICD-10-CM | POA: Diagnosis not present

## 2017-10-14 DIAGNOSIS — R262 Difficulty in walking, not elsewhere classified: Secondary | ICD-10-CM

## 2017-10-14 DIAGNOSIS — M6281 Muscle weakness (generalized): Secondary | ICD-10-CM

## 2017-10-14 NOTE — Therapy (Signed)
[facility] [address] [place], [place], [address] Phone: [phone]   Fax:  [phone]  Physical Therapy Treatment  Patient Details  Name: Allison Norman MRN: [id number] Date of Birth: [birth year] Referring Provider (PT): [name], MD    Encounter Date: 10/14/2017  PT End of Session - 10/14/17 1411    Visit Number  6    Number of Visits  18    Date for PT Re-Evaluation  11/02/17    Authorization Type  Medicare A and B, BCBS  10th visit PN to MD    PT Start Time  1400    Activity Tolerance  Patient tolerated treatment well    Behavior During Therapy  [facility] for tasks assessed/performed       Past Medical History:  Diagnosis Date  . Atrial fibrillation ([place]) 2017   a. s/p DCCV in 10/2015  b. recurrent in 08/2016 --> rate-control pursued.   . Cancer ([place])    Breast  . CKD (chronic kidney disease)   . GCA (giant cell arteritis) ([place])   . Hypertension   . Macular degeneration   . Osteoporosis   . PMR (polymyalgia rheumatica) (HCC)   . Skin cancer     Past Surgical History:  Procedure Laterality Date  . CARDIOVERSION N/A 10/18/2015   Procedure: CARDIOVERSION;  Surgeon: Jerline Pain, MD;  Location: [place];  Service: Cardiovascular;  Laterality: N/A;  . CARDIOVERSION N/A 02/20/2017   Procedure: CARDIOVERSION;  Surgeon: Pixie Casino, MD;  Location: [facility] ENDOSCOPY;  Service: Cardiovascular;  Laterality: N/A;  . CARDIOVERSION N/A 06/18/2017   Procedure: CARDIOVERSION;  Surgeon: Sanda Klein, MD;  Location: [place] ENDOSCOPY;  Service: Cardiovascular;  Laterality: N/A;  . KNEE SURGERY  2013  . MASTECTOMY  1998   left side    There were no vitals filed for this visit.  Subjective Assessment - 10/14/17 1406    Subjective  Tolerated last visit well, and had to go to the grocery store afterwards.  Her cane was left in the cart and she went back and had to walk the whole store without it to find it.   That tired her out.    Pertinent History  HTN, temporal arteritis, Afib, CKD, MRSA, polymyalgia rheumatic, macular degeneration, osteoporosis, skin cancer, and R TKA.      Patient Stated Goals  To improve balance; pt is now fearful of going out of her condo    Currently in Pain?  No/denies                       [facility] Adult PT Treatment/Exercise - 10/14/17 0001      Exercises   Exercises  Lumbar;Knee/Hip      Knee/Hip Exercises: Aerobic   Nustep  level 2 x 8'      Knee/Hip Exercises: Standing   Heel Raises  Both;1 set;10 reps    Heel Raises Limitations  with toe raises alternating     Hip Flexion  Stengthening   2# ankle weights   Hip Abduction  Stengthening;Both;1 set;10 reps;Knee straight    Abduction Limitations  2# ankle weights    Hip Extension  Stengthening;Both;15 reps;Knee straight    Extension Limitations  2      Knee/Hip Exercises: Seated   Long Arc Quad  2 sets;AROM;Strengthening;Both   15 reps   Long Arc Quad Weight  2 lbs.    Marching  Strengthening;Right;Left;2 sets;20 reps    [employer]  Weights  2 lbs.          Balance Exercises - 10/14/17 1430      Balance Exercises: Standing   Tandem Stance  Upper extremity support 1   2 sets with progressive narrowing of stagger stance x 30 sec   Rebounder  Double leg   weight shifts in stagger stance bil x 1' each         PT Short Term Goals - 10/05/17 1452      PT SHORT TERM GOAL #1   Title  Pt will be independent with intial HEP and report return to walking program with daughter up to 1 mile.    Time  4    Period  Weeks    Status  Achieved   She isn't sure she wants to walk that distance again.        PT Long Term Goals - 09/03/17 2121      PT LONG TERM GOAL #1   Title  Pt will be independent with final HEP and will report walking up to 1.5 miles with daughter    Time  8    Period  Weeks    Status  New    Target Date  11/02/17      PT LONG TERM GOAL #2   Title  Pt will improve  gait velocity to >/= 3.0 ft/sec with cane    Time  8    Period  Weeks    Status  New    Target Date  11/02/17      PT LONG TERM GOAL #3   Title  Pt will improve BERG to >/= 40/56    Time  8    Period  Weeks    Status  New    Target Date  11/02/17      PT LONG TERM GOAL #4   Title  Pt will improve five time sit to stand to >/= 18 seconds with supervision and decrease use of UE    Time  8    Period  Weeks    Status  New    Target Date  11/02/17      PT LONG TERM GOAL #5   Title  Pt will ambulate x 300' over uneven outdoor surfaces, curb, and negotiate 2 stairs with one rail and cane with supervision     Time  8    Period  Weeks    Status  New    Target Date  11/02/17            Plan - 10/14/17 1442    Clinical Impression Statement  Pt continues to display balance deficits with standing ther ex requiring bil UE support.  She has intermittent posterior balance losses on sit to stand requiring stand by assist.  She continues to display difficulty rising from a standard height chair without use of UEs but displays more stability and balance in standing when using UEs to stand vs. using momentum.  PT provided stand by assist during balance tasks today and noted improved ability to balance with less UE touches on counter during more narrowed stagger stance challenge today.    Rehab Potential  Good    PT Frequency  2x / week    PT Duration  8 weeks    PT Treatment/Interventions  ADLs/Self Care Home Management;Aquatic Therapy;DME Instruction;Gait training;Stair training;Functional mobility training;Therapeutic activities;Therapeutic exercise;Balance training;Neuromuscular re-education;Patient/family education    PT Next Visit Plan   HEP, balance, sit to stand  PT Home Exercise Plan  [id number]     Consulted and Agree with Plan of Care  Patient       Patient will benefit from skilled therapeutic intervention in order to improve the following deficits and impairments:  Abnormal  gait, Decreased balance, Decreased strength, Difficulty walking  Visit Diagnosis: Repeated falls  Muscle weakness (generalized)  Unsteadiness on feet  Difficulty in walking, not elsewhere classified     Problem List Patient Active Problem List   Diagnosis Date Noted  . Fusion beats 08/03/2017  . Atypical atrial flutter ([place])   . Persistent atrial fibrillation 08/19/2016  . On anticoagulant therapy 07/10/2016  . Renal insufficiency 12/07/2015  . PAF (paroxysmal atrial fibrillation) ([place])   . Atrial fibrillation with RVR ([place]) 08/31/2015  . CKD (chronic kidney disease) stage 3, GFR 30-59 ml/min (HCC) 08/31/2015  . Essential hypertension 07/16/2009  . TEMPORAL ARTERITIS 07/16/2009  . POLYMYALGIA RHEUMATICA 07/16/2009  . MRSA 07/02/2009  . PYOGENIC ARTHRITIS, LOWER LEG 07/02/2009   Baruch Merl, PT 10/14/17 2:45 PM    [facility] [address] [place], [place], [address] Phone: [phone]   Fax:  [phone]  Name: Allison Norman MRN: [id number] Date of Birth: 10/29/1924

## 2017-10-16 ENCOUNTER — Ambulatory Visit
Admission: RE | Admit: 2017-10-16 | Discharge: 2017-10-16 | Disposition: A | Discharge status: Home / Self Care | Payer: Medicare Other | Source: Ambulatory Visit | Attending: Family Medicine | Admitting: Family Medicine

## 2017-10-16 DIAGNOSIS — N63 Unspecified lump in unspecified breast: Secondary | ICD-10-CM

## 2017-10-16 DIAGNOSIS — R922 Inconclusive mammogram: Secondary | ICD-10-CM | POA: Diagnosis not present

## 2017-10-16 DIAGNOSIS — N6011 Diffuse cystic mastopathy of right breast: Secondary | ICD-10-CM | POA: Diagnosis not present

## 2017-10-19 ENCOUNTER — Ambulatory Visit: Payer: Medicare Other | Admitting: Physical Therapy

## 2017-10-19 ENCOUNTER — Encounter: Payer: Self-pay | Admitting: Physical Therapy

## 2017-10-19 DIAGNOSIS — M6281 Muscle weakness (generalized): Secondary | ICD-10-CM

## 2017-10-19 DIAGNOSIS — R2681 Unsteadiness on feet: Secondary | ICD-10-CM | POA: Diagnosis not present

## 2017-10-19 DIAGNOSIS — R296 Repeated falls: Secondary | ICD-10-CM

## 2017-10-19 DIAGNOSIS — R262 Difficulty in walking, not elsewhere classified: Secondary | ICD-10-CM

## 2017-10-19 NOTE — Therapy (Signed)
[facility] [address], [place], [place], [address] Phone: [phone]   Fax:  [phone]  Physical Therapy Treatment  Patient Details  Name: Allison Norman MRN: [id number] Date of Birth: 17-Mar-1924 Referring Provider (PT): [name], MD    Encounter Date: 10/19/2017  PT End of Session - 10/19/17 1445    Visit Number  7    Number of Visits  18    Date for PT Re-Evaluation  11/02/17    Authorization Type  Medicare A and B, BCBS  10th visit PN to MD    PT Start Time  1400    PT Stop Time  1443    PT Time Calculation (min)  43 min    Activity Tolerance  Patient tolerated treatment well    Behavior During Therapy  [facility] for tasks assessed/performed       Past Medical History:  Diagnosis Date  . Atrial fibrillation ([place]) 2017   a. s/p DCCV in 10/2015  b. recurrent in 08/2016 --> rate-control pursued.   . Cancer ([place])    Breast  . CKD (chronic kidney disease)   . GCA (giant cell arteritis) ([place])   . Hypertension   . Macular degeneration   . Osteoporosis   . PMR (polymyalgia rheumatica) (HCC)   . Skin cancer     Past Surgical History:  Procedure Laterality Date  . CARDIOVERSION N/A 10/18/2015   Procedure: CARDIOVERSION;  Surgeon: Jerline Pain, MD;  Location: [place];  Service: Cardiovascular;  Laterality: N/A;  . CARDIOVERSION N/A 02/20/2017   Procedure: CARDIOVERSION;  Surgeon: Pixie Casino, MD;  Location: [facility] ENDOSCOPY;  Service: Cardiovascular;  Laterality: N/A;  . CARDIOVERSION N/A 06/18/2017   Procedure: CARDIOVERSION;  Surgeon: Sanda Klein, MD;  Location: [place] ENDOSCOPY;  Service: Cardiovascular;  Laterality: N/A;  . KNEE SURGERY  2013  . MASTECTOMY  1998   left side    There were no vitals filed for this visit.  Subjective Assessment - 10/19/17 1402    Subjective  Pt states she feels a little wobbly, worse yesterday than today.  She has been doing her exercises every day but  last Friday.    Pertinent History  HTN, temporal arteritis, Afib, CKD, MRSA, polymyalgia rheumatic, macular degeneration, osteoporosis, skin cancer, and R TKA.      Limitations  Standing;Walking    Patient Stated Goals  To improve balance; pt is now fearful of going out of her condo    Currently in Pain?  No/denies                       [facility] Adult PT Treatment/Exercise - 10/19/17 0001      Ambulation/Gait   Ambulation/Gait  Yes    Ambulation/Gait Assistance  6: Modified independent (Device/Increase time);5: Supervision    Ambulation/Gait Assistance Details  single point cane    Ambulation Distance (Feet)  100 Feet   x2   Gait Comments  Pt ambulated 100 feet with simulated community obstacles such as step up/down on riser, step on and march on balance pad x 100 feet x 2      Exercises   Exercises  Lumbar;Knee/Hip;Ankle      Lumbar Exercises: Standing   Functional Squats  10 reps   with bil UE support   Other Standing Lumbar Exercises  marching on foam pad with bil UE support    Other Standing Lumbar Exercises  knee flexion 2# ankle weight on  foam pad x 20 reps      Knee/Hip Exercises: Seated   Long Arc Quad  2 sets;AROM;Strengthening;Both   15 reps   Long Arc Quad Weight  2 lbs.    Marching  Strengthening;Both;15 reps    Marching Weights  2 lbs.    Sit to Sand  5 reps;2 sets   PT cued Pt to get COG forward and tuck hips under upon stand              PT Short Term Goals - 10/05/17 1452      PT SHORT TERM GOAL #1   Title  Pt will be independent with intial HEP and report return to walking program with daughter up to 1 mile.    Time  4    Period  Weeks    Status  Achieved   She isn't sure she wants to walk that distance again.        PT Long Term Goals - 09/03/17 2121      PT LONG TERM GOAL #1   Title  Pt will be independent with final HEP and will report walking up to 1.5 miles with daughter    Time  8    Period  Weeks    Status  New     Target Date  11/02/17      PT LONG TERM GOAL #2   Title  Pt will improve gait velocity to >/= 3.0 ft/sec with cane    Time  8    Period  Weeks    Status  New    Target Date  11/02/17      PT LONG TERM GOAL #3   Title  Pt will improve BERG to >/= 40/56    Time  8    Period  Weeks    Status  New    Target Date  11/02/17      PT LONG TERM GOAL #4   Title  Pt will improve five time sit to stand to >/= 18 seconds with supervision and decrease use of UE    Time  8    Period  Weeks    Status  New    Target Date  11/02/17      PT LONG TERM GOAL #5   Title  Pt will ambulate x 300' over uneven outdoor surfaces, curb, and negotiate 2 stairs with one rail and cane with supervision     Time  8    Period  Weeks    Status  New    Target Date  11/02/17            Plan - 10/19/17 1444    Clinical Impression Statement  Pt with need for verbal cues and demonstration by PT to perform sit to stand with proper form including getting center of gravity forward and tucking hips under her upon standing.  When patient did this with improved form she had less posterior LOB upon standing.  Spent much of today with gait activities including changing directions, step ups/down with riser and stepping on and off balance pad to simulate community ambulation and tasks.  She continues to need cane and if available a second surface for bil UE support with curb simulation.  She required stand by assist for occasional balance loss when turning 180 degrees during gait.  She will continue to benefit from ther ex, gait and balance activities to improve her safety, independence and fall risk.    PT Frequency  2x / week    PT Duration  8 weeks    PT Treatment/Interventions  ADLs/Self Care Home Management;Aquatic Therapy;DME Instruction;Gait training;Stair training;Functional mobility training;Therapeutic activities;Therapeutic exercise;Balance training;Neuromuscular re-education;Patient/family education    PT Next  Visit Plan   HEP, balance, sit to stand, gait activities for community ambulation, change of directions    PT Home Exercise Plan  [id number]        Patient will benefit from skilled therapeutic intervention in order to improve the following deficits and impairments:  Abnormal gait, Decreased balance, Decreased strength, Difficulty walking  Visit Diagnosis: Repeated falls  Muscle weakness (generalized)  Unsteadiness on feet  Difficulty in walking, not elsewhere classified     Problem List Patient Active Problem List   Diagnosis Date Noted  . Fusion beats 08/03/2017  . Atypical atrial flutter ([place])   . Persistent atrial fibrillation 08/19/2016  . On anticoagulant therapy 07/10/2016  . Renal insufficiency 12/07/2015  . PAF (paroxysmal atrial fibrillation) ([place])   . Atrial fibrillation with RVR ([place]) 08/31/2015  . CKD (chronic kidney disease) stage 3, GFR 30-59 ml/min (HCC) 08/31/2015  . Essential hypertension 07/16/2009  . TEMPORAL ARTERITIS 07/16/2009  . POLYMYALGIA RHEUMATICA 07/16/2009  . MRSA 07/02/2009  . PYOGENIC ARTHRITIS, LOWER LEG 07/02/2009   Baruch Merl, PT 10/19/17 4:36 PM    [facility] [address] [place], [place], [address] Phone: [phone]   Fax:  [phone]  Name: Allison Norman MRN: [id number] Date of Birth: Oct 09, 1924

## 2017-10-21 ENCOUNTER — Encounter: Payer: Self-pay | Admitting: Physical Therapy

## 2017-10-21 ENCOUNTER — Ambulatory Visit: Payer: Medicare Other | Admitting: Physical Therapy

## 2017-10-21 DIAGNOSIS — R296 Repeated falls: Secondary | ICD-10-CM | POA: Diagnosis not present

## 2017-10-21 DIAGNOSIS — R2681 Unsteadiness on feet: Secondary | ICD-10-CM

## 2017-10-21 DIAGNOSIS — R262 Difficulty in walking, not elsewhere classified: Secondary | ICD-10-CM

## 2017-10-21 DIAGNOSIS — M6281 Muscle weakness (generalized): Secondary | ICD-10-CM | POA: Diagnosis not present

## 2017-10-21 NOTE — Therapy (Signed)
[facility] [address] [place], [place], [address] Phone: [phone]   Fax:  [phone]  Physical Therapy Treatment  Patient Details  Name: Allison Norman MRN: [id number] Date of Birth: 01-02-1925 Referring Provider (PT): [name], MD    Encounter Date: 10/21/2017  PT End of Session - 10/21/17 1445    Visit Number  8    Number of Visits  18    Date for PT Re-Evaluation  11/02/17    Authorization Type  Medicare A and B, BCBS  10th visit PN to MD    PT Start Time  1400    PT Stop Time  1442    PT Time Calculation (min)  42 min    Activity Tolerance  Patient tolerated treatment well    Behavior During Therapy  [facility] for tasks assessed/performed       Past Medical History:  Diagnosis Date  . Atrial fibrillation ([place]) 2017   a. s/p DCCV in 10/2015  b. recurrent in 08/2016 --> rate-control pursued.   . Cancer ([place])    Breast  . CKD (chronic kidney disease)   . GCA (giant cell arteritis) ([place])   . Hypertension   . Macular degeneration   . Osteoporosis   . PMR (polymyalgia rheumatica) (HCC)   . Skin cancer     Past Surgical History:  Procedure Laterality Date  . CARDIOVERSION N/A 10/18/2015   Procedure: CARDIOVERSION;  Surgeon: Jerline Pain, MD;  Location: [facility];  Service: Cardiovascular;  Laterality: N/A;  . CARDIOVERSION N/A 02/20/2017   Procedure: CARDIOVERSION;  Surgeon: Pixie Casino, MD;  Location: [facility] ENDOSCOPY;  Service: Cardiovascular;  Laterality: N/A;  . CARDIOVERSION N/A 06/18/2017   Procedure: CARDIOVERSION;  Surgeon: Sanda Klein, MD;  Location: [facility] ENDOSCOPY;  Service: Cardiovascular;  Laterality: N/A;  . KNEE SURGERY  2013  . MASTECTOMY  1998   left side    There were no vitals filed for this visit.  Subjective Assessment - 10/21/17 1401    Subjective  Pt states she was pretty tired after last visit.  She went home afterwards instead of running an errand due to  fatigue.  Feels more steady on her feet today.    Pertinent History  HTN, temporal arteritis, Afib, CKD, MRSA, polymyalgia rheumatic, macular degeneration, osteoporosis, skin cancer, and R TKA.      Currently in Pain?  No/denies                       [facility] Adult PT Treatment/Exercise - 10/21/17 0001      Ambulation/Gait   Ambulation/Gait  Yes    Ambulation/Gait Assistance  6: Modified independent (Device/Increase time)    Ambulation/Gait Assistance Details  single point cane    Ambulation Distance (Feet)  300 Feet    Ambulation Surface  Level    Gait Comments  PT cued heel toe pattern and use of gluts for more hip extension in terminal stance      Exercises   Exercises  Lumbar;Knee/Hip      Lumbar Exercises: Aerobic   Nustep  level 2 x 4', level 3 legs only x 4'   PT present to monitor, discuss plan for treatment today     Lumbar Exercises: Standing   Heel Raises  10 reps    Heel Raises Limitations  2 sets    Functional Squats  10 reps    Functional Squats Limitations  2 sets  Lumbar Exercises: Seated   Other Seated Lumbar Exercises  sit to stand x 5 with hands on knees   PT cued forward trunk lean first, then tuck hips under     Knee/Hip Exercises: Standing   Hip Extension  Stengthening;Both;2 sets;15 reps;Knee straight    Rebounder  weight shifts and marching in stagger stance Rt/Lt/Front-Back x 1 min each      Knee/Hip Exercises: Seated   Long Arc Quad  Strengthening;Both;1 set;15 reps      Ankle Exercises: Seated   Other Seated Ankle Exercises  toe raises x 20 reps               PT Short Term Goals - 10/05/17 1452      PT SHORT TERM GOAL #1   Title  Pt will be independent with intial HEP and report return to walking program with daughter up to 1 mile.    Time  4    Period  Weeks    Status  Achieved   She isn't sure she wants to walk that distance again.        PT Long Term Goals - 09/03/17 2121      PT LONG TERM GOAL #1    Title  Pt will be independent with final HEP and will report walking up to 1.5 miles with daughter    Time  8    Period  Weeks    Status  New    Target Date  11/02/17      PT LONG TERM GOAL #2   Title  Pt will improve gait velocity to >/= 3.0 ft/sec with cane    Time  8    Period  Weeks    Status  New    Target Date  11/02/17      PT LONG TERM GOAL #3   Title  Pt will improve BERG to >/= 40/56    Time  8    Period  Weeks    Status  New    Target Date  11/02/17      PT LONG TERM GOAL #4   Title  Pt will improve five time sit to stand to >/= 18 seconds with supervision and decrease use of UE    Time  8    Period  Weeks    Status  New    Target Date  11/02/17      PT LONG TERM GOAL #5   Title  Pt will ambulate x 300' over uneven outdoor surfaces, curb, and negotiate 2 stairs with one rail and cane with supervision     Time  8    Period  Weeks    Status  New    Target Date  11/02/17            Plan - 10/21/17 1852    Clinical Impression Statement  Pt is making progress with functional strength and balance.  Her movement pattern for sit to stand is improving but she continues to require bil UEs on knees or arm rests to stand.  She demonstrated reduced posterior loss of balance upon rising during sit to stands.  PT progressed glut and quad strengthening today to work toward more independence with sit to stands and increased her ankle weights.  PT also focused on gait training with cue for heel-toe pattern for stance phase and increased use of gluteals for increased step length.  Pt states she still doesn't like to take big steps for fear of  falling.  PT lowered her cane one setting which Pt reported feeling more comfortable with.  She does not bear weight through it but rather uses it to steady herself.  She will continue to benefit from skilled intervention to address gait, balance, functional movements, endurance and strength to increase her independence and safety in the  community.    PT Frequency  2x / week    PT Duration  8 weeks    PT Treatment/Interventions  ADLs/Self Care Home Management;Aquatic Therapy;DME Instruction;Gait training;Stair training;Functional mobility training;Therapeutic activities;Therapeutic exercise;Balance training;Neuromuscular re-education;Patient/family education    PT Next Visit Plan  continue with gait activities for community ambulation, sit to stand, glut/quad strength, balance during varied stances, stepping, curb, direction changes    PT Home Exercise Plan  [id number]     Consulted and Agree with Plan of Care  Patient       Patient will benefit from skilled therapeutic intervention in order to improve the following deficits and impairments:  Abnormal gait, Decreased balance, Decreased strength, Difficulty walking  Visit Diagnosis: Repeated falls  Difficulty in walking, not elsewhere classified  Unsteadiness on feet  Muscle weakness (generalized)     Problem List Patient Active Problem List   Diagnosis Date Noted  . Fusion beats 08/03/2017  . Atypical atrial flutter ([place])   . Persistent atrial fibrillation 08/19/2016  . On anticoagulant therapy 07/10/2016  . Renal insufficiency 12/07/2015  . PAF (paroxysmal atrial fibrillation) ([place])   . Atrial fibrillation with RVR ([place]) 08/31/2015  . CKD (chronic kidney disease) stage 3, GFR 30-59 ml/min (HCC) 08/31/2015  . Essential hypertension 07/16/2009  . TEMPORAL ARTERITIS 07/16/2009  . POLYMYALGIA RHEUMATICA 07/16/2009  . MRSA 07/02/2009  . PYOGENIC ARTHRITIS, LOWER LEG 07/02/2009   Baruch Merl, PT 10/21/17 7:04 PM   [facility] [address] [place], [place], [address] Phone: [phone]   Fax:  [phone]  Name: PORSCHIA WILLBANKS MRN: [id number] Date of Birth: [birth year]

## 2017-10-26 ENCOUNTER — Encounter: Payer: Self-pay | Admitting: Physical Therapy

## 2017-10-26 ENCOUNTER — Ambulatory Visit: Payer: Medicare Other | Admitting: Physical Therapy

## 2017-10-26 DIAGNOSIS — M6281 Muscle weakness (generalized): Secondary | ICD-10-CM | POA: Diagnosis not present

## 2017-10-26 DIAGNOSIS — R262 Difficulty in walking, not elsewhere classified: Secondary | ICD-10-CM | POA: Diagnosis not present

## 2017-10-26 DIAGNOSIS — R296 Repeated falls: Secondary | ICD-10-CM

## 2017-10-26 DIAGNOSIS — R2681 Unsteadiness on feet: Secondary | ICD-10-CM | POA: Diagnosis not present

## 2017-10-26 NOTE — Therapy (Signed)
[facility] [address], [place], [place], [address] Phone: [phone]   Fax:  [phone]  Physical Therapy Treatment  Patient Details  Name: Allison Norman MRN: [id number] Date of Birth: April 09, 1924 Referring Provider (PT): [name], MD    Encounter Date: 10/26/2017  PT End of Session - 10/26/17 1442    Visit Number  9    Number of Visits  18    Date for PT Re-Evaluation  11/02/17    Authorization Type  Medicare A and B, BCBS  10th visit PN to MD    PT Start Time  1401    PT Stop Time  1442    PT Time Calculation (min)  41 min    Activity Tolerance  Patient tolerated treatment well    Behavior During Therapy  [facility] for tasks assessed/performed       Past Medical History:  Diagnosis Date  . Atrial fibrillation ([place]) 2017   a. s/p DCCV in 10/2015  b. recurrent in 08/2016 --> rate-control pursued.   . Cancer ([place])    Breast  . CKD (chronic kidney disease)   . GCA (giant cell arteritis) ([place])   . Hypertension   . Macular degeneration   . Osteoporosis   . PMR (polymyalgia rheumatica) (HCC)   . Skin cancer     Past Surgical History:  Procedure Laterality Date  . CARDIOVERSION N/A 10/18/2015   Procedure: CARDIOVERSION;  Surgeon: Jerline Pain, MD;  Location: [facility];  Service: Cardiovascular;  Laterality: N/A;  . CARDIOVERSION N/A 02/20/2017   Procedure: CARDIOVERSION;  Surgeon: Pixie Casino, MD;  Location: [facility] ENDOSCOPY;  Service: Cardiovascular;  Laterality: N/A;  . CARDIOVERSION N/A 06/18/2017   Procedure: CARDIOVERSION;  Surgeon: Sanda Klein, MD;  Location: [facility] ENDOSCOPY;  Service: Cardiovascular;  Laterality: N/A;  . KNEE SURGERY  2013  . MASTECTOMY  1998   left side    There were no vitals filed for this visit.  Subjective Assessment - 10/26/17 1411    Subjective  Pt stated she felt wobbly this weekend.  Had some epidodes of vertigo and decided to use her cane around the house  which she normally doesn't do but didn't want to fall.  She says she is trying to take bigger steps when walking like we worked on last visit.    Pertinent History  HTN, temporal arteritis, Afib, CKD, MRSA, polymyalgia rheumatic, macular degeneration, osteoporosis, skin cancer, and R TKA.      Limitations  Standing;Walking    Patient Stated Goals  To improve balance; pt is now fearful of going out of her condo                       [facility] Adult PT Treatment/Exercise - 10/26/17 0001      Ambulation/Gait   Ambulation/Gait  Yes    Ambulation/Gait Assistance  6: Modified independent (Device/Increase time);5: Supervision    Ambulation/Gait Assistance Details  single point cane    Ambulation Distance (Feet)  160 Feet   2 sets   Assistive device  Straight cane    Gait Pattern  Step-through pattern    Ambulation Surface  Level      Exercises   Exercises  Lumbar;Knee/Hip      Knee/Hip Exercises: Standing   Forward Step Up  Both;10 reps    Forward Step Up Limitations  bil UE support rails    Step Down  Both;10 reps  Step Down Limitations  6 inch step to balance pad to reduce distance for increased success with eccentric control of quads    Functional Squat  10 reps;2 sets    Functional Squat Limitations  chair + 2 balance pads, PT cued for forward trunk bend to get COG forward     Other Standing Knee Exercises  heel toe raises with bil UE support x 20 reps      Knee/Hip Exercises: Seated   Long Arc Quad  Strengthening;Both;15 reps;2 sets    Long Arc Quad Weight  3 lbs.    Marching  Strengthening;Both;2 sets;20 reps    Marching Weights  3 lbs.               PT Short Term Goals - 10/05/17 1452      PT SHORT TERM GOAL #1   Title  Pt will be independent with intial HEP and report return to walking program with daughter up to 1 mile.    Time  4    Period  Weeks    Status  Achieved   She isn't sure she wants to walk that distance again.        PT Long Term  Goals - 09/03/17 2121      PT LONG TERM GOAL #1   Title  Pt will be independent with final HEP and will report walking up to 1.5 miles with daughter    Time  8    Period  Weeks    Status  New    Target Date  11/02/17      PT LONG TERM GOAL #2   Title  Pt will improve gait velocity to >/= 3.0 ft/sec with cane    Time  8    Period  Weeks    Status  New    Target Date  11/02/17      PT LONG TERM GOAL #3   Title  Pt will improve BERG to >/= 40/56    Time  8    Period  Weeks    Status  New    Target Date  11/02/17      PT LONG TERM GOAL #4   Title  Pt will improve five time sit to stand to >/= 18 seconds with supervision and decrease use of UE    Time  8    Period  Weeks    Status  New    Target Date  11/02/17      PT LONG TERM GOAL #5   Title  Pt will ambulate x 300' over uneven outdoor surfaces, curb, and negotiate 2 stairs with one rail and cane with supervision     Time  8    Period  Weeks    Status  New    Target Date  11/02/17            Plan - 10/26/17 1442    Clinical Impression Statement  Pt with initial unsteadiness on feet during gait today with several occurrences of legs giving way.  She became more steady throughout the session.  PT worked on sit to stand from elevated chair, step ups and step downs to continue working on functional strength.  She has more strength in Lt leg than Rt which is especially noted when descending stairs.  She will continue to benefit from skilled PT to address gait, balance and strength deficits.    Rehab Potential  Good    PT Frequency  2x / week  PT Duration  8 weeks    PT Treatment/Interventions  ADLs/Self Care Home Management;Aquatic Therapy;DME Instruction;Gait training;Stair training;Functional mobility training;Therapeutic activities;Therapeutic exercise;Balance training;Neuromuscular re-education;Patient/family education    PT Next Visit Plan  continue with gait activities for community ambulation, sit to stand,  glut/quad strength, balance during varied stances, stepping, curb, direction changes    PT Home Exercise Plan  [id number]     Consulted and Agree with Plan of Care  Patient       Patient will benefit from skilled therapeutic intervention in order to improve the following deficits and impairments:  Abnormal gait, Decreased balance, Decreased strength, Difficulty walking  Visit Diagnosis: Repeated falls  Difficulty in walking, not elsewhere classified  Unsteadiness on feet  Muscle weakness (generalized)     Problem List Patient Active Problem List   Diagnosis Date Noted  . Fusion beats 08/03/2017  . Atypical atrial flutter ([place])   . Persistent atrial fibrillation 08/19/2016  . On anticoagulant therapy 07/10/2016  . Renal insufficiency 12/07/2015  . PAF (paroxysmal atrial fibrillation) ([place])   . Atrial fibrillation with RVR ([place]) 08/31/2015  . CKD (chronic kidney disease) stage 3, GFR 30-59 ml/min (HCC) 08/31/2015  . Essential hypertension 07/16/2009  . TEMPORAL ARTERITIS 07/16/2009  . POLYMYALGIA RHEUMATICA 07/16/2009  . MRSA 07/02/2009  . PYOGENIC ARTHRITIS, LOWER LEG 07/02/2009   Baruch Merl, PT 10/26/17 2:43 PM    [facility] [address], [place], [place], [address] Phone: [phone]   Fax:  [phone]  Name: TALOR DESROSIERS MRN: [id number] Date of Birth: Jan 05, 1925

## 2017-10-28 ENCOUNTER — Ambulatory Visit: Payer: Medicare Other | Admitting: Physical Therapy

## 2017-10-28 ENCOUNTER — Encounter: Payer: Self-pay | Admitting: Physical Therapy

## 2017-10-28 DIAGNOSIS — R2681 Unsteadiness on feet: Secondary | ICD-10-CM

## 2017-10-28 DIAGNOSIS — M6281 Muscle weakness (generalized): Secondary | ICD-10-CM

## 2017-10-28 DIAGNOSIS — R262 Difficulty in walking, not elsewhere classified: Secondary | ICD-10-CM

## 2017-10-28 DIAGNOSIS — R296 Repeated falls: Secondary | ICD-10-CM | POA: Diagnosis not present

## 2017-10-28 NOTE — Therapy (Signed)
[facility] [address] [place], [place], [address] Phone: [phone]   Fax:  [phone]  Physical Therapy Treatment  Progress Note Reporting Period 09/03/17 to 10/28/17  See note below for Objective Data and Assessment of Progress/Goals.  Re-cert sent.     Patient Details  Name: Allison Norman MRN: [id number] Date of Birth: 10/01/1924 Referring Provider (PT): [name], MD    Encounter Date: 10/28/2017  PT End of Session - 10/28/17 1716    Visit Number  10    Number of Visits  18    Date for PT Re-Evaluation  12/09/17    Authorization Type  Medicare A and B, BCBS  10th visit PN to MD    PT Start Time  1400    PT Stop Time  1445    PT Time Calculation (min)  45 min    Activity Tolerance  Patient tolerated treatment well    Behavior During Therapy  [facility] for tasks assessed/performed       Past Medical History:  Diagnosis Date  . Atrial fibrillation ([place]) 2017   a. s/p DCCV in 10/2015  b. recurrent in 08/2016 --> rate-control pursued.   . Cancer ([place])    Breast  . CKD (chronic kidney disease)   . GCA (giant cell arteritis) ([place])   . Hypertension   . Macular degeneration   . Osteoporosis   . PMR (polymyalgia rheumatica) (H[facility])   . Skin cancer     Past Surgical History:  Procedure Laterality Date  . CARDIOVERSION N/A 10/18/2015   Procedure: CARDIOVERSION;  Surgeon: Jerline Pain, MD;  Location: [facility];  Service: Cardiovascular;  Laterality: N/A;  . CARDIOVERSION N/A 02/20/2017   Procedure: CARDIOVERSION;  Surgeon: Pixie Casino, MD;  Location: [facility] ENDOSCOPY;  Service: Cardiovascular;  Laterality: N/A;  . CARDIOVERSION N/A 06/18/2017   Procedure: CARDIOVERSION;  Surgeon: Sanda Klein, MD;  Location: [facility] ENDOSCOPY;  Service: Cardiovascular;  Laterality: N/A;  . KNEE SURGERY  2013  . MASTECTOMY  1998   left side    There were no vitals filed for this visit.      [facility] PT  Assessment - 10/28/17 0001      Assessment   Medical Diagnosis  falls    Referring Provider (PT)  [name], MD     Onset Date/Surgical Date  08/31/17    Prior Therapy  after TKA      Precautions   Precautions  Fall;Other (comment)    Precaution Comments  HTN, temporal arteritis, Afib, CKD, MRSA, polymyalgia rheumatic, macular degeneration, osteoporosis, skin cancer, and R TKA.        ROM / Strength   AROM / PROM / Strength  Strength      Strength   Overall Strength  Deficits    Overall Strength Comments  hip flexion 4-/5 bil, ankle DF 4-/5 bil, hip extension 4-/5 bil      Balance   Balance Assessed  Yes      Standardized Balance Assessment   Standardized Balance Assessment  Berg Balance Test;Five Times Sit to Stand    Five times sit to stand comments   39 sec with bil hands on chair up and down      [employer]   Sit to Stand  Able to stand  independently using hands    Standing Unsupported  Able to stand safely 2 minutes    Sitting with Back Unsupported but Feet Supported on  Floor or Stool  Able to sit safely and securely 2 minutes    Stand to Sit  Controls descent by using hands    Transfers  Able to transfer safely, minor use of hands    Standing Unsupported with Eyes Closed  Able to stand 10 seconds with supervision    Standing Ubsupported with Feet Together  Able to place feet together independently but unable to hold for 30 seconds    From Standing, Reach Forward with Outstretched Arm  Can reach forward >12 cm safely (5")    From Standing Position, Pick up Object from Floor  Able to pick up shoe, needs supervision    From Standing Position, Turn to Look Behind Over each Shoulder  Looks behind one side only/other side shows less weight shift    Turn 360 Degrees  Able to turn 360 degrees safely but slowly   with cane   Standing Unsupported, Alternately Place Feet on Step/Stool  Able to stand independently and complete 8 steps >20 seconds    Standing  Unsupported, One Foot in Front  Needs help to step but can hold 15 seconds    Standing on One Leg  Tries to lift leg/unable to hold 3 seconds but remains standing independently    Total Score  39    Berg comment:  39/56      Functional Gait  Assessment   Gait assessed   Yes                             PT Short Term Goals - 10/28/17 1422      PT SHORT TERM GOAL #1   Title  Pt will be independent with intial HEP and report return to walking program with daughter up to 1 mile.    Time  4    Period  Weeks    Status  Achieved   Pt does not want to walk x 1 mile again.     PT SHORT TERM GOAL #2   Title  Pt will improve gait velocity with cane to >/= 2.62 ft/sec    Status  Deferred   gait velocity not assessed and not a goal for Pt     PT SHORT TERM GOAL #3   Title  Pt will improve BERG score to >/= 36/56 to decrease falls risk    Baseline  28/56    Time  4    Period  Weeks    Status  Achieved   Pt achieved 39/56 10/28/17     PT SHORT TERM GOAL #4   Title  Pt will improve LE strength as indicated by ability to perform five time sit to stand to </= 25 seconds with supervision and decrease incidence of posterior LOB    Baseline  32 seconds with min A due to 4 LOB posterior    Time  4    Period  Weeks    Status  On-going   Pt slower than baseline today. Focusing on proper performance which is slowing her down.       PT Long Term Goals - 10/28/17 1424      PT LONG TERM GOAL #1   Title  Pt will be independent with final HEP and will report walking up to 1.5 miles with daughter    Time  8    Period  Weeks    Status  Deferred   Pt not interested in walking for  distance.     PT LONG TERM GOAL #2   Title  Pt will improve gait velocity to >/= 3.0 ft/sec with cane    Time  8    Period  Weeks    Status  Deferred   Gait velocity not assessed and not a goal of Pt     PT LONG TERM GOAL #3   Title  Pt will improve BERG to >/= 40/56    Time  8    Period   Weeks    Status  On-going   Pt achieved 39/56 10/28/17     PT LONG TERM GOAL #4   Title  Pt will improve five time sit to stand to >/= 18 seconds with supervision and decrease use of UE    Time  8    Period  Weeks    Status  On-going   Pt with increased time to perform today but with improved body mechanics and less posterior loss of balance upon standing     PT LONG TERM GOAL #5   Title  Pt will ambulate x 300' over even outdoor surfaces, curb, and negotiate 2 stairs with one rail and cane with supervision     Time  8    Period  Weeks    Status  Revised   revised uneven to even surfaces as Pt decided she will not walk on uneven surfaces           Plan - 10/28/17 1717    Clinical Impression Statement  Pt has been seen for 10 visits of PT over the course of 8 weeks and has made partial gains which are steadily improving.  She still poses a significant fall risk despite her improved score on the Berg Balance from initial eval of 28/56 to today's score of 39/56.  Her strength in her LEs have improved in all muscle groups ranging from a 4-/5 to 5/5.  She ambulates 300' with a slow step through gait using a single point cane and was able to perform an outdoor curb x 2 trials with supervision, use of cane and no loss of balance.  She is able to perform sit to stand without use of UEs one time but then quickly fatigues and needs to use bil UEs on thighs or chair to assist.  Her posterior loss of balance occurrence with sit to stand is decreasing.  Her 5 times sit to stand test was slower today than at evaluation, but she displays a cognitive approach to improved body mechanics which she systematically applies causing her to slow down but perform with greater safety.  PT revised some of her goals set at initial evaluation as she stated doesn't desire walking for distance or at faster speed.  She will continue to benefit from PT 2x a week for 6 weeks to improve her safety, further decrease her fall  risk, improve her strength and balance, and improve her control, stability and safety on stairs and curbs.    Rehab Potential  Good    PT Frequency  2x / week    PT Duration  6 weeks    PT Next Visit Plan  continue with gait activities for community ambulation, sit to stand, glut/quad strength, balance during varied stances, stepping, curb, direction changes    PT Home Exercise Plan  [id number]     Consulted and Agree with Plan of Care  Patient       Patient will benefit from skilled therapeutic intervention in  order to improve the following deficits and impairments:  Abnormal gait, Decreased balance, Decreased strength, Difficulty walking  Visit Diagnosis: Repeated falls - Plan: PT plan of care cert/re-cert  Difficulty in walking, not elsewhere classified - Plan: PT plan of care cert/re-cert  Unsteadiness on feet - Plan: PT plan of care cert/re-cert  Muscle weakness (generalized) - Plan: PT plan of care cert/re-cert     Problem List Patient Active Problem List   Diagnosis Date Noted  . Fusion beats 08/03/2017  . Atypical atrial flutter ([place])   . Persistent atrial fibrillation 08/19/2016  . On anticoagulant therapy 07/10/2016  . Renal insufficiency 12/07/2015  . PAF (paroxysmal atrial fibrillation) ([place])   . Atrial fibrillation with RVR ([place]) 08/31/2015  . CKD (chronic kidney disease) stage 3, GFR 30-59 ml/min (HCC) 08/31/2015  . Essential hypertension 07/16/2009  . TEMPORAL ARTERITIS 07/16/2009  . POLYMYALGIA RHEUMATICA 07/16/2009  . MRSA 07/02/2009  . PYOGENIC ARTHRITIS, LOWER LEG 07/02/2009   Baruch Merl, PT 10/28/17 5:40 PM    [facility] [address] [place], [place], [address] Phone: [phone]   Fax:  [phone]  Name: Allison Norman MRN: [id number] Date of Birth: 11/19/1924

## 2017-11-04 ENCOUNTER — Ambulatory Visit: Payer: Medicare Other | Admitting: Physical Therapy

## 2017-11-04 ENCOUNTER — Encounter: Payer: Self-pay | Admitting: Physical Therapy

## 2017-11-04 ENCOUNTER — Other Ambulatory Visit: Payer: Self-pay | Admitting: Internal Medicine

## 2017-11-04 DIAGNOSIS — M6281 Muscle weakness (generalized): Secondary | ICD-10-CM

## 2017-11-04 DIAGNOSIS — I4891 Unspecified atrial fibrillation: Secondary | ICD-10-CM

## 2017-11-04 DIAGNOSIS — R262 Difficulty in walking, not elsewhere classified: Secondary | ICD-10-CM | POA: Diagnosis not present

## 2017-11-04 DIAGNOSIS — R2681 Unsteadiness on feet: Secondary | ICD-10-CM | POA: Diagnosis not present

## 2017-11-04 DIAGNOSIS — R296 Repeated falls: Secondary | ICD-10-CM

## 2017-11-04 NOTE — Therapy (Signed)
[facility] [address], [place], [place], [address] Phone: [phone]   Fax:  [phone]  Physical Therapy Treatment  Patient Details  Name: Allison Norman MRN: [id number] Date of Birth: 10-03-1924 Referring Provider (PT): [name], MD    Encounter Date: 11/04/2017  PT End of Session - 11/04/17 1623    Visit Number  11    Number of Visits  18    Date for PT Re-Evaluation  12/09/17    Authorization Type  Medicare A and B, BCBS  10th visit PN to MD    PT Start Time  28   Pt wanted to go home before it got too dark and we started 5 min late   PT Stop Time  1654    PT Time Calculation (min)  34 min    Activity Tolerance  Patient tolerated treatment well    Behavior During Therapy  [facility] for tasks assessed/performed       Past Medical History:  Diagnosis Date  . Atrial fibrillation ([place]) 2017   a. s/p DCCV in 10/2015  b. recurrent in 08/2016 --> rate-control pursued.   . Cancer ([place])    Breast  . CKD (chronic kidney disease)   . GCA (giant cell arteritis) ([place])   . Hypertension   . Macular degeneration   . Osteoporosis   . PMR (polymyalgia rheumatica) (HCC)   . Skin cancer     Past Surgical History:  Procedure Laterality Date  . CARDIOVERSION N/A 10/18/2015   Procedure: CARDIOVERSION;  Surgeon: Jerline Pain, MD;  Location: [facility];  Service: Cardiovascular;  Laterality: N/A;  . CARDIOVERSION N/A 02/20/2017   Procedure: CARDIOVERSION;  Surgeon: Pixie Casino, MD;  Location: [facility] ENDOSCOPY;  Service: Cardiovascular;  Laterality: N/A;  . CARDIOVERSION N/A 06/18/2017   Procedure: CARDIOVERSION;  Surgeon: Sanda Klein, MD;  Location: [facility];  Service: Cardiovascular;  Laterality: N/A;  . KNEE SURGERY  2013  . MASTECTOMY  1998   left side    There were no vitals filed for this visit.  Subjective Assessment - 11/04/17 1624    Subjective  No new complaints. "I feel pretty good  considering the rain today."    Pertinent History  HTN, temporal arteritis, Afib, CKD, MRSA, polymyalgia rheumatic, macular degeneration, osteoporosis, skin cancer, and R TKA.      Currently in Pain?  No/denies    Multiple Pain Sites  No                       [facility] Adult PT Treatment/Exercise - 11/04/17 0001      Lumbar Exercises: Aerobic   Nustep  L2 x 7 min   PTA present to discuss current status     Knee/Hip Exercises: Standing   Forward Step Up  Both;1 set;10 reps;Hand Hold: 2;Step Height: 6"    Step Down  Both;1 set;10 reps;Hand Hold: 2;Step Height: 6"      Knee/Hip Exercises: Seated   Long Arc Quad  Strengthening;Both;2 sets;15 reps;Weights    Long Arc Quad Weight  3 lbs.    Ball [employer] and glue cocontraction 3 sec hold 10x    Marching  Strengthening;Both;2 sets;20 reps    [employer]  3 lbs.    Sit to Sand  1 set;10 reps;with UE support   sitting on black pad              PT Short Term Goals -  10/28/17 1422      PT SHORT TERM GOAL #1   Title  Pt will be independent with intial HEP and report return to walking program with daughter up to 1 mile.    Time  4    Period  Weeks    Status  Achieved   Pt does not want to walk x 1 mile again.     PT SHORT TERM GOAL #2   Title  Pt will improve gait velocity with cane to >/= 2.62 ft/sec    Status  Deferred   gait velocity not assessed and not a goal for Pt     PT SHORT TERM GOAL #3   Title  Pt will improve BERG score to >/= 36/56 to decrease falls risk    Baseline  28/56    Time  4    Period  Weeks    Status  Achieved   Pt achieved 39/56 10/28/17     PT SHORT TERM GOAL #4   Title  Pt will improve LE strength as indicated by ability to perform five time sit to stand to </= 25 seconds with supervision and decrease incidence of posterior LOB    Baseline  32 seconds with min A due to 4 LOB posterior    Time  4    Period  Weeks    Status  On-going   Pt slower than baseline today.  Focusing on proper performance which is slowing her down.       PT Long Term Goals - 10/28/17 1424      PT LONG TERM GOAL #1   Title  Pt will be independent with final HEP and will report walking up to 1.5 miles with daughter    Time  8    Period  Weeks    Status  Deferred   Pt not interested in walking for distance.     PT LONG TERM GOAL #2   Title  Pt will improve gait velocity to >/= 3.0 ft/sec with cane    Time  8    Period  Weeks    Status  Deferred   Gait velocity not assessed and not a goal of Pt     PT LONG TERM GOAL #3   Title  Pt will improve BERG to >/= 40/56    Time  8    Period  Weeks    Status  On-going   Pt achieved 39/56 10/28/17     PT LONG TERM GOAL #4   Title  Pt will improve five time sit to stand to >/= 18 seconds with supervision and decrease use of UE    Time  8    Period  Weeks    Status  On-going   Pt with increased time to perform today but with improved body mechanics and less posterior loss of balance upon standing     PT LONG TERM GOAL #5   Title  Pt will ambulate x 300' over even outdoor surfaces, curb, and negotiate 2 stairs with one rail and cane with supervision     Time  8    Period  Weeks    Status  Revised   revised uneven to even surfaces as Pt decided she will not walk on uneven surfaces           Plan - 11/04/17 1623    Clinical Impression Statement  Pt demonstrates weakness eccentically in LE going down stairs (RT > LT), but improving. Sit  to stand elevated on the black pad improving, using UE minimally today. Remains slow with gait, externally rotated at hips to widen her BOS.     Rehab Potential  Good    PT Frequency  2x / week    PT Duration  6 weeks    PT Treatment/Interventions  ADLs/Self Care Home Management;Aquatic Therapy;DME Instruction;Gait training;Stair training;Functional mobility training;Therapeutic activities;Therapeutic exercise;Balance training;Neuromuscular re-education;Patient/family education    PT  Next Visit Plan  continue with gait activities for community ambulation, sit to stand, glut/quad strength, balance during varied stances, stepping, curb, direction changes    PT Home Exercise Plan  [id number]     Consulted and Agree with Plan of Care  Patient       Patient will benefit from skilled therapeutic intervention in order to improve the following deficits and impairments:  Abnormal gait, Decreased balance, Decreased strength, Difficulty walking  Visit Diagnosis: Repeated falls  Difficulty in walking, not elsewhere classified  Unsteadiness on feet  Muscle weakness (generalized)     Problem List Patient Active Problem List   Diagnosis Date Noted  . Fusion beats 08/03/2017  . Atypical atrial flutter ([place])   . Persistent atrial fibrillation 08/19/2016  . On anticoagulant therapy 07/10/2016  . Renal insufficiency 12/07/2015  . PAF (paroxysmal atrial fibrillation) ([place])   . Atrial fibrillation with RVR ([place]) 08/31/2015  . CKD (chronic kidney disease) stage 3, GFR 30-59 ml/min (HCC) 08/31/2015  . Essential hypertension 07/16/2009  . TEMPORAL ARTERITIS 07/16/2009  . POLYMYALGIA RHEUMATICA 07/16/2009  . MRSA 07/02/2009  . PYOGENIC ARTHRITIS, LOWER LEG 07/02/2009    Graydon Fofana, PTA 11/04/2017, 4:55 PM  [facility] [address], [place], [place], [address] Phone: [phone]   Fax:  [phone]  Name: Allison Norman MRN: [id number] Date of Birth: 11/05/1924

## 2017-11-06 ENCOUNTER — Encounter

## 2017-11-11 ENCOUNTER — Encounter: Payer: Self-pay | Admitting: Physical Therapy

## 2017-11-11 ENCOUNTER — Ambulatory Visit: Payer: Medicare Other | Attending: Family Medicine | Admitting: Physical Therapy

## 2017-11-11 DIAGNOSIS — M6281 Muscle weakness (generalized): Secondary | ICD-10-CM | POA: Diagnosis not present

## 2017-11-11 DIAGNOSIS — R296 Repeated falls: Secondary | ICD-10-CM | POA: Diagnosis not present

## 2017-11-11 DIAGNOSIS — R262 Difficulty in walking, not elsewhere classified: Secondary | ICD-10-CM | POA: Diagnosis not present

## 2017-11-11 DIAGNOSIS — R2681 Unsteadiness on feet: Secondary | ICD-10-CM

## 2017-11-11 NOTE — Therapy (Signed)
[facility] [address] [place], [place], [address] Phone: [phone]   Fax:  [phone]  Physical Therapy Treatment  Patient Details  Name: Allison Norman MRN: [id number] Date of Birth: March 18, 1924 Referring Provider (PT): [name], MD    Encounter Date: 11/11/2017  PT End of Session - 11/11/17 1322    Visit Number  12    Number of Visits  18    Date for PT Re-Evaluation  12/09/17    Authorization Type  Medicare A and B, BCBS  10th visit PN to MD    PT Start Time  1231    PT Stop Time  1314    PT Time Calculation (min)  43 min    Equipment Utilized During Treatment  Gait belt    Behavior During Therapy  WFL for tasks assessed/performed       Past Medical History:  Diagnosis Date  . Atrial fibrillation ([place]) 2017   a. s/p DCCV in 10/2015  b. recurrent in 08/2016 --> rate-control pursued.   . Cancer ([place])    Breast  . CKD (chronic kidney disease)   . GCA (giant cell arteritis) ([place])   . Hypertension   . Macular degeneration   . Osteoporosis   . PMR (polymyalgia rheumatica) (H[facility])   . Skin cancer     Past Surgical History:  Procedure Laterality Date  . CARDIOVERSION N/A 10/18/2015   Procedure: CARDIOVERSION;  Surgeon: Jerline Pain, MD;  Location: [place];  Service: Cardiovascular;  Laterality: N/A;  . CARDIOVERSION N/A 02/20/2017   Procedure: CARDIOVERSION;  Surgeon: Pixie Casino, MD;  Location: [facility] ENDOSCOPY;  Service: Cardiovascular;  Laterality: N/A;  . CARDIOVERSION N/A 06/18/2017   Procedure: CARDIOVERSION;  Surgeon: Sanda Klein, MD;  Location: [place] ENDOSCOPY;  Service: Cardiovascular;  Laterality: N/A;  . KNEE SURGERY  2013  . MASTECTOMY  1998   left side    There were no vitals filed for this visit.  Subjective Assessment - 11/11/17 1320    Subjective  Pt is feeling stronger the past few days.  She doesn't feel as confident leaving the house due to balance concerns and  sometimes avoids leaving the house.  Would like to work more on balance tasks.    Pertinent History  HTN, temporal arteritis, Afib, CKD, MRSA, polymyalgia rheumatic, macular degeneration, osteoporosis, skin cancer, and R TKA.      Limitations  Standing;Walking    Patient Stated Goals  To improve balance; pt is now fearful of going out of her condo    Currently in Pain?  No/denies         [facility] PT Assessment - 11/11/17 0001      Ambulation/Gait   Ambulation/Gait  Yes    Ambulation/Gait Assistance  5: Supervision;6: Modified independent (Device/Increase time)    Ambulation Distance (Feet)  350 Feet    Assistive device  Straight cane    Gait Pattern  Step-through pattern    Ambulation Surface  Outdoor;Level    Curb  5: Supervision;6: Modified independent (Device/increase time)    Curb Details (indicate cue type and reason)  x5 reps up/down with [facility]                   [facility] Adult PT Treatment/Exercise - 11/11/17 0001      Ambulation/Gait   Stairs  Yes    Stair Management Technique  Step to pattern;One rail Right;With cane;Forwards    Number of Stairs  4  Height of Stairs  6      Lumbar Exercises: Aerobic   Nustep  level 2 x 6 min          Balance Exercises - 11/11/17 1306      Balance Exercises: Standing   Tandem Stance  30 secs;1 rep   bil with cane, supervision   Standing, One Foot on a Step  6 inch;Eyes open;30 secs;2 reps   bil, with cane   Step Ups  6 inch;UE support 1    Step Over Hurdles / Cones  pool noodles x 15 reps    Other Standing Exercises  standing 360 turns bil x 3 reps each          PT Short Term Goals - 10/28/17 1422      PT SHORT TERM GOAL #1   Title  Pt will be independent with intial HEP and report return to walking program with daughter up to 1 mile.    Time  4    Period  Weeks    Status  Achieved   Pt does not want to walk x 1 mile again.     PT SHORT TERM GOAL #2   Title  Pt will improve gait velocity with cane to >/=  2.62 ft/sec    Status  Deferred   gait velocity not assessed and not a goal for Pt     PT SHORT TERM GOAL #3   Title  Pt will improve BERG score to >/= 36/56 to decrease falls risk    Baseline  28/56    Time  4    Period  Weeks    Status  Achieved   Pt achieved 39/56 10/28/17     PT SHORT TERM GOAL #4   Title  Pt will improve LE strength as indicated by ability to perform five time sit to stand to </= 25 seconds with supervision and decrease incidence of posterior LOB    Baseline  32 seconds with min A due to 4 LOB posterior    Time  4    Period  Weeks    Status  On-going   Pt slower than baseline today. Focusing on proper performance which is slowing her down.       PT Long Term Goals - 10/28/17 1424      PT LONG TERM GOAL #1   Title  Pt will be independent with final HEP and will report walking up to 1.5 miles with daughter    Time  8    Period  Weeks    Status  Deferred   Pt not interested in walking for distance.     PT LONG TERM GOAL #2   Title  Pt will improve gait velocity to >/= 3.0 ft/sec with cane    Time  8    Period  Weeks    Status  Deferred   Gait velocity not assessed and not a goal of Pt     PT LONG TERM GOAL #3   Title  Pt will improve BERG to >/= 40/56    Time  8    Period  Weeks    Status  On-going   Pt achieved 39/56 10/28/17     PT LONG TERM GOAL #4   Title  Pt will improve five time sit to stand to >/= 18 seconds with supervision and decrease use of UE    Time  8    Period  Weeks    Status  On-going   Pt with increased time to perform today but with improved body mechanics and less posterior loss of balance upon standing     PT LONG TERM GOAL #5   Title  Pt will ambulate x 300' over even outdoor surfaces, curb, and negotiate 2 stairs with one rail and cane with supervision     Time  8    Period  Weeks    Status  Revised   revised uneven to even surfaces as Pt decided she will not walk on uneven surfaces           Plan - 11/11/17  1322    Clinical Impression Statement  PT focused on higher level gait and balance tasks today.  Pt with min need for verbal cues with cane management/use on curbs, steps and step overs but appeared steadier with performance today than in past.  She struggles with control going down stairs and requires bil UE support with cane and rail.  Sit to stand improved with repeated reps today vs fatigue setting in demonstrating improved motor plan and strength.  She will continue to benefit from targeted gait and balance tasks as well as community ambulation to improve her safety and confidence for increased community activities.    Rehab Potential  Good    PT Frequency  2x / week    PT Duration  6 weeks    PT Treatment/Interventions  ADLs/Self Care Home Management;Aquatic Therapy;DME Instruction;Gait training;Stair training;Functional mobility training;Therapeutic activities;Therapeutic exercise;Balance training;Neuromuscular re-education;Patient/family education    PT Next Visit Plan  continue balance training, community ambulation, stair training, challenged timed stance activities    PT Home Exercise Plan  [id number]     Consulted and Agree with Plan of Care  Patient       Patient will benefit from skilled therapeutic intervention in order to improve the following deficits and impairments:  Abnormal gait, Decreased balance, Decreased strength, Difficulty walking  Visit Diagnosis: Unsteadiness on feet  Difficulty in walking, not elsewhere classified  Repeated falls     Problem List Patient Active Problem List   Diagnosis Date Noted  . Fusion beats 08/03/2017  . Atypical atrial flutter ([place])   . Persistent atrial fibrillation 08/19/2016  . On anticoagulant therapy 07/10/2016  . Renal insufficiency 12/07/2015  . PAF (paroxysmal atrial fibrillation) ([place])   . Atrial fibrillation with RVR ([place]) 08/31/2015  . CKD (chronic kidney disease) stage 3, GFR 30-59 ml/min (HCC) 08/31/2015  . Essential  hypertension 07/16/2009  . TEMPORAL ARTERITIS 07/16/2009  . POLYMYALGIA RHEUMATICA 07/16/2009  . MRSA 07/02/2009  . PYOGENIC ARTHRITIS, LOWER LEG 07/02/2009    Baruch Merl, PT 11/11/17 1:29 PM   [facility] [address] [place], [place], [address] Phone: [phone]   Fax:  [phone]  Name: Allison Norman MRN: [id number] Date of Birth: 09/04/1924

## 2017-11-16 ENCOUNTER — Encounter: Payer: Self-pay | Admitting: Physical Therapy

## 2017-11-16 ENCOUNTER — Ambulatory Visit: Payer: Medicare Other | Admitting: Physical Therapy

## 2017-11-16 DIAGNOSIS — M6281 Muscle weakness (generalized): Secondary | ICD-10-CM

## 2017-11-16 DIAGNOSIS — R296 Repeated falls: Secondary | ICD-10-CM | POA: Diagnosis not present

## 2017-11-16 DIAGNOSIS — R2681 Unsteadiness on feet: Secondary | ICD-10-CM | POA: Diagnosis not present

## 2017-11-16 DIAGNOSIS — R262 Difficulty in walking, not elsewhere classified: Secondary | ICD-10-CM

## 2017-11-16 NOTE — Therapy (Signed)
[facility] [address] [place], [place], [address] Phone: [phone]   Fax:  [phone]  Physical Therapy Treatment  Patient Details  Name: Allison Norman MRN: [id number] Date of Birth: 07/27/1924 Referring Provider (PT): [name], MD    Encounter Date: 11/16/2017  PT End of Session - 11/16/17 1406    Visit Number  13    Number of Visits  18    Date for PT Re-Evaluation  12/09/17    Authorization Type  Medicare A and B, BCBS  10th visit PN to MD    PT Start Time  1405   5 min late   PT Stop Time  1443    PT Time Calculation (min)  38 min    Activity Tolerance  Patient tolerated treatment well    Behavior During Therapy  [facility] for tasks assessed/performed       Past Medical History:  Diagnosis Date  . Atrial fibrillation ([place]) 2017   a. s/p DCCV in 10/2015  b. recurrent in 08/2016 --> rate-control pursued.   . Cancer ([place])    Breast  . CKD (chronic kidney disease)   . GCA (giant cell arteritis) ([place])   . Hypertension   . Macular degeneration   . Osteoporosis   . PMR (polymyalgia rheumatica) (H[facility])   . Skin cancer     Past Surgical History:  Procedure Laterality Date  . CARDIOVERSION N/A 10/18/2015   Procedure: CARDIOVERSION;  Surgeon: Jerline Pain, MD;  Location: [place];  Service: Cardiovascular;  Laterality: N/A;  . CARDIOVERSION N/A 02/20/2017   Procedure: CARDIOVERSION;  Surgeon: Pixie Casino, MD;  Location: [facility] ENDOSCOPY;  Service: Cardiovascular;  Laterality: N/A;  . CARDIOVERSION N/A 06/18/2017   Procedure: CARDIOVERSION;  Surgeon: Sanda Klein, MD;  Location: [place] ENDOSCOPY;  Service: Cardiovascular;  Laterality: N/A;  . KNEE SURGERY  2013  . MASTECTOMY  1998   left side    There were no vitals filed for this visit.  Subjective Assessment - 11/16/17 1407    Subjective  Was away for the weekend so she didn't do much. No pain. [place]. Going down still a challenge.    Pertinent History  HTN, temporal arteritis, Afib, CKD, MRSA, polymyalgia rheumatic, macular degeneration, osteoporosis, skin cancer, and R TKA.      Currently in Pain?  No/denies    Multiple Pain Sites  No                       [facility] Adult PT Treatment/Exercise - 11/16/17 0001      High Level Balance   High Level Balance Activities  --   Alt toe taps with light UE 20x     Knee/Hip Exercises: Aerobic   Nustep  level 2 x 10   PTA present to monitor pt     Knee/Hip Exercises: Machines for Strengthening   Total Gym Leg Press  Single leg, seat 7: 35# 10x each    VC for eccentric control.      Knee/Hip Exercises: Standing   Heel Raises  Both;1 set;10 reps   emphasis on eccentric lowering   Forward Step Up  Both;2 sets;10 reps;Hand Hold: 2;Step Height: 6"   Light UE only   Rebounder  weight shifts and marching in stagger stance Rt/Lt/Front-Back x 1 min each  PT Short Term Goals - 10/28/17 1422      PT SHORT TERM GOAL #1   Title  Pt will be independent with intial HEP and report return to walking program with daughter up to 1 mile.    Time  4    Period  Weeks    Status  Achieved   Pt does not want to walk x 1 mile again.     PT SHORT TERM GOAL #2   Title  Pt will improve gait velocity with cane to >/= 2.62 ft/sec    Status  Deferred   gait velocity not assessed and not a goal for Pt     PT SHORT TERM GOAL #3   Title  Pt will improve BERG score to >/= 36/56 to decrease falls risk    Baseline  28/56    Time  4    Period  Weeks    Status  Achieved   Pt achieved 39/56 10/28/17     PT SHORT TERM GOAL #4   Title  Pt will improve LE strength as indicated by ability to perform five time sit to stand to </= 25 seconds with supervision and decrease incidence of posterior LOB    Baseline  32 seconds with min A due to 4 LOB posterior    Time  4    Period  Weeks    Status  On-going   Pt slower  than baseline today. Focusing on proper performance which is slowing her down.       PT Long Term Goals - 10/28/17 1424      PT LONG TERM GOAL #1   Title  Pt will be independent with final HEP and will report walking up to 1.5 miles with daughter    Time  8    Period  Weeks    Status  Deferred   Pt not interested in walking for distance.     PT LONG TERM GOAL #2   Title  Pt will improve gait velocity to >/= 3.0 ft/sec with cane    Time  8    Period  Weeks    Status  Deferred   Gait velocity not assessed and not a goal of Pt     PT LONG TERM GOAL #3   Title  Pt will improve BERG to >/= 40/56    Time  8    Period  Weeks    Status  On-going   Pt achieved 39/56 10/28/17     PT LONG TERM GOAL #4   Title  Pt will improve five time sit to stand to >/= 18 seconds with supervision and decrease use of UE    Time  8    Period  Weeks    Status  On-going   Pt with increased time to perform today but with improved body mechanics and less posterior loss of balance upon standing     PT LONG TERM GOAL #5   Title  Pt will ambulate x 300' over even outdoor surfaces, curb, and negotiate 2 stairs with one rail and cane with supervision     Time  8    Period  Weeks    Status  Revised   revised uneven to even surfaces as Pt decided she will not walk on uneven surfaces           Plan - 11/16/17 1409    Clinical Impression Statement  Pt's balance challenged with any activity where there is a  single leg stance moment. She typically needed to add or increase her UE support with those activities. Ascending stairs appears stronger, descending stairs not much improvement, struggles with control.     Rehab Potential  Good    PT Frequency  2x / week    PT Duration  6 weeks    PT Treatment/Interventions  ADLs/Self Care Home Management;Aquatic Therapy;DME Instruction;Gait training;Stair training;Functional mobility training;Therapeutic activities;Therapeutic exercise;Balance  training;Neuromuscular re-education;Patient/family education    PT Next Visit Plan  continue balance training, community ambulation, stair training, challenged timed stance activities    PT Home Exercise Plan  [id number]     Consulted and Agree with Plan of Care  Patient       Patient will benefit from skilled therapeutic intervention in order to improve the following deficits and impairments:  Abnormal gait, Decreased balance, Decreased strength, Difficulty walking  Visit Diagnosis: Unsteadiness on feet  Difficulty in walking, not elsewhere classified  Repeated falls  Muscle weakness (generalized)     Problem List Patient Active Problem List   Diagnosis Date Noted  . Fusion beats 08/03/2017  . Atypical atrial flutter ([place])   . Persistent atrial fibrillation 08/19/2016  . On anticoagulant therapy 07/10/2016  . Renal insufficiency 12/07/2015  . PAF (paroxysmal atrial fibrillation) ([place])   . Atrial fibrillation with RVR ([place]) 08/31/2015  . CKD (chronic kidney disease) stage 3, GFR 30-59 ml/min (HCC) 08/31/2015  . Essential hypertension 07/16/2009  . TEMPORAL ARTERITIS 07/16/2009  . POLYMYALGIA RHEUMATICA 07/16/2009  . MRSA 07/02/2009  . PYOGENIC ARTHRITIS, LOWER LEG 07/02/2009    COCHRAN,JENNIFER, PTA 11/16/2017, 2:33 PM  [facility] [address] [place], [place], [address] Phone: [phone]   Fax:  [phone]  Name: Allison Norman MRN: [id number] Date of Birth: [birth year]

## 2017-11-18 ENCOUNTER — Encounter: Payer: Self-pay | Admitting: Physical Therapy

## 2017-11-18 ENCOUNTER — Ambulatory Visit: Payer: Medicare Other | Admitting: Physical Therapy

## 2017-11-18 DIAGNOSIS — M6281 Muscle weakness (generalized): Secondary | ICD-10-CM | POA: Diagnosis not present

## 2017-11-18 DIAGNOSIS — R262 Difficulty in walking, not elsewhere classified: Secondary | ICD-10-CM

## 2017-11-18 DIAGNOSIS — R296 Repeated falls: Secondary | ICD-10-CM | POA: Diagnosis not present

## 2017-11-18 DIAGNOSIS — R2681 Unsteadiness on feet: Secondary | ICD-10-CM | POA: Diagnosis not present

## 2017-11-18 NOTE — Therapy (Signed)
[facility] [address] [place], [place], [address] Phone: [phone]   Fax:  [phone]  Physical Therapy Treatment  Patient Details  Name: Allison Norman MRN: [id number] Date of Birth: 08-24-1924 Referring Provider (PT): [name], MD    Encounter Date: 11/18/2017  PT End of Session - 11/18/17 1230    Visit Number  14    Number of Visits  18    Date for PT Re-Evaluation  12/09/17    Authorization Type  Medicare A and B, BCBS  10th visit PN to MD    PT Start Time  1228    PT Stop Time  1307    PT Time Calculation (min)  39 min    Activity Tolerance  Patient tolerated treatment well    Behavior During Therapy  [facility] for tasks assessed/performed       Past Medical History:  Diagnosis Date  . Atrial fibrillation ([place]) 2017   a. s/p DCCV in 10/2015  b. recurrent in 08/2016 --> rate-control pursued.   . Cancer ([place])    Breast  . CKD (chronic kidney disease)   . GCA (giant cell arteritis) ([place])   . Hypertension   . Macular degeneration   . Osteoporosis   . PMR (polymyalgia rheumatica) ([facility])   . Skin cancer     Past Surgical History:  Procedure Laterality Date  . CARDIOVERSION N/A 10/18/2015   Procedure: CARDIOVERSION;  Surgeon: Jerline Pain, MD;  Location: [place];  Service: Cardiovascular;  Laterality: N/A;  . CARDIOVERSION N/A 02/20/2017   Procedure: CARDIOVERSION;  Surgeon: Pixie Casino, MD;  Location: [facility] ENDOSCOPY;  Service: Cardiovascular;  Laterality: N/A;  . CARDIOVERSION N/A 06/18/2017   Procedure: CARDIOVERSION;  Surgeon: Sanda Klein, MD;  Location: [place] ENDOSCOPY;  Service: Cardiovascular;  Laterality: N/A;  . KNEE SURGERY  2013  . MASTECTOMY  1998   left side    There were no vitals filed for this visit.  Subjective Assessment - 11/18/17 1231    Subjective  I sat and read a lot yesterday so I am pretty stiff today.     Currently in Pain?  No/denies    Multiple Pain  Sites  No                       [facility] Adult PT Treatment/Exercise - 11/18/17 0001      Neuro Re-ed    Neuro Re-ed Details   Alt toe taps with 1 UE support at stairs 10x each side, side stepping with red band along countertop with floating hands 4x, red band hip abd 10x bil, extension 10x bil holding onto couter for balance      Knee/Hip Exercises: Aerobic   Nustep  level 2 x 10   PTA present to monitor pt     Knee/Hip Exercises: Machines for Strengthening   Total Gym Leg Press  Single leg, seat 7: 35# 10x2 each    VC for eccentric control.      Knee/Hip Exercises: Standing   Heel Raises  Both;1 set;20 reps    Forward Step Up  Both;2 sets;10 reps;Hand Hold: 2;Step Height: 6"   Light UE on rail and cane on the other.     Knee/Hip Exercises: Seated   Long Arc Quad  Strengthening;Both;1 set;15 reps;Weights    Long Arc Quad Weight  4 lbs.               PT Short  Term Goals - 10/28/17 1422      PT SHORT TERM GOAL #1   Title  Pt will be independent with intial HEP and report return to walking program with daughter up to 1 mile.    Time  4    Period  Weeks    Status  Achieved   Pt does not want to walk x 1 mile again.     PT SHORT TERM GOAL #2   Title  Pt will improve gait velocity with cane to >/= 2.62 ft/sec    Status  Deferred   gait velocity not assessed and not a goal for Pt     PT SHORT TERM GOAL #3   Title  Pt will improve BERG score to >/= 36/56 to decrease falls risk    Baseline  28/56    Time  4    Period  Weeks    Status  Achieved   Pt achieved 39/56 10/28/17     PT SHORT TERM GOAL #4   Title  Pt will improve LE strength as indicated by ability to perform five time sit to stand to </= 25 seconds with supervision and decrease incidence of posterior LOB    Baseline  32 seconds with min A due to 4 LOB posterior    Time  4    Period  Weeks    Status  On-going   Pt slower than baseline today. Focusing on proper performance which is slowing  her down.       PT Long Term Goals - 10/28/17 1424      PT LONG TERM GOAL #1   Title  Pt will be independent with final HEP and will report walking up to 1.5 miles with daughter    Time  8    Period  Weeks    Status  Deferred   Pt not interested in walking for distance.     PT LONG TERM GOAL #2   Title  Pt will improve gait velocity to >/= 3.0 ft/sec with cane    Time  8    Period  Weeks    Status  Deferred   Gait velocity not assessed and not a goal of Pt     PT LONG TERM GOAL #3   Title  Pt will improve BERG to >/= 40/56    Time  8    Period  Weeks    Status  On-going   Pt achieved 39/56 10/28/17     PT LONG TERM GOAL #4   Title  Pt will improve five time sit to stand to >/= 18 seconds with supervision and decrease use of UE    Time  8    Period  Weeks    Status  On-going   Pt with increased time to perform today but with improved body mechanics and less posterior loss of balance upon standing     PT LONG TERM GOAL #5   Title  Pt will ambulate x 300' over even outdoor surfaces, curb, and negotiate 2 stairs with one rail and cane with supervision     Time  8    Period  Weeks    Status  Revised   revised uneven to even surfaces as Pt decided she will not walk on uneven surfaces           Plan - 11/18/17 1231    Clinical Impression Statement  Pt reports she does not tire as easily as she used to  prior to coming to PT, in addition, Pt used to "wipe me out and I would have to take a nap. I do not have to do that now." Descending stairs remain challenging, pt did well with red band resistive side stepping and hip extension. Pt demonstrated better control on the leg press.     Rehab Potential  Good    PT Frequency  2x / week    PT Duration  6 weeks    PT Treatment/Interventions  ADLs/Self Care Home Management;Aquatic Therapy;DME Instruction;Gait training;Stair training;Functional mobility training;Therapeutic activities;Therapeutic exercise;Balance  training;Neuromuscular re-education;Patient/family education    PT Next Visit Plan  continue balance training, community ambulation, stair training, challenged timed stance activities    PT Home Exercise Plan  [id number]     Consulted and Agree with Plan of Care  Patient       Patient will benefit from skilled therapeutic intervention in order to improve the following deficits and impairments:  Abnormal gait, Decreased balance, Decreased strength, Difficulty walking  Visit Diagnosis: Unsteadiness on feet  Difficulty in walking, not elsewhere classified  Repeated falls  Muscle weakness (generalized)     Problem List Patient Active Problem List   Diagnosis Date Noted  . Fusion beats 08/03/2017  . Atypical atrial flutter ([place])   . Persistent atrial fibrillation 08/19/2016  . On anticoagulant therapy 07/10/2016  . Renal insufficiency 12/07/2015  . PAF (paroxysmal atrial fibrillation) ([place])   . Atrial fibrillation with RVR ([place]) 08/31/2015  . CKD (chronic kidney disease) stage 3, GFR 30-59 ml/min (HCC) 08/31/2015  . Essential hypertension 07/16/2009  . TEMPORAL ARTERITIS 07/16/2009  . POLYMYALGIA RHEUMATICA 07/16/2009  . MRSA 07/02/2009  . PYOGENIC ARTHRITIS, LOWER LEG 07/02/2009    Allison Norman, PTA 11/18/2017, 1:07 PM  [facility] [address] [place], [place], [address] Phone: [phone]   Fax:  [phone]  Name: Allison Norman MRN: [id number] Date of Birth: 02-May-1924

## 2017-11-20 DIAGNOSIS — L299 Pruritus, unspecified: Secondary | ICD-10-CM | POA: Diagnosis not present

## 2017-11-20 DIAGNOSIS — N183 Chronic kidney disease, stage 3 (moderate): Secondary | ICD-10-CM | POA: Diagnosis not present

## 2017-11-20 DIAGNOSIS — I10 Essential (primary) hypertension: Secondary | ICD-10-CM | POA: Diagnosis not present

## 2017-11-23 ENCOUNTER — Encounter: Payer: Self-pay | Admitting: Physical Therapy

## 2017-11-23 ENCOUNTER — Ambulatory Visit: Payer: Medicare Other | Admitting: Physical Therapy

## 2017-11-23 DIAGNOSIS — M6281 Muscle weakness (generalized): Secondary | ICD-10-CM

## 2017-11-23 DIAGNOSIS — R296 Repeated falls: Secondary | ICD-10-CM

## 2017-11-23 DIAGNOSIS — R2681 Unsteadiness on feet: Secondary | ICD-10-CM

## 2017-11-23 DIAGNOSIS — R262 Difficulty in walking, not elsewhere classified: Secondary | ICD-10-CM

## 2017-11-23 NOTE — Therapy (Signed)
[facility] [address], [place], [place], [address] Phone: [phone]   Fax:  [phone]  Physical Therapy Treatment  Patient Details  Name: Allison Norman MRN: [id number] Date of Birth: 03-Oct-1924 Referring Provider (PT): [name], MD    Encounter Date: 11/23/2017  PT End of Session - 11/23/17 1225    Visit Number  15    Date for PT Re-Evaluation  12/09/17    Authorization Type  Medicare A and B, BCBS  10th visit PN to MD    PT Start Time  1230    PT Stop Time  1310    PT Time Calculation (min)  40 min    Equipment Utilized During Treatment  Gait belt    Activity Tolerance  Patient tolerated treatment well    Behavior During Therapy  [facility] for tasks assessed/performed       Past Medical History:  Diagnosis Date  . Atrial fibrillation ([place]) 2017   a. s/p DCCV in 10/2015  b. recurrent in 08/2016 --> rate-control pursued.   . Cancer ([place])    Breast  . CKD (chronic kidney disease)   . GCA (giant cell arteritis) ([place])   . Hypertension   . Macular degeneration   . Osteoporosis   . PMR (polymyalgia rheumatica) (H[facility])   . Skin cancer     Past Surgical History:  Procedure Laterality Date  . CARDIOVERSION N/A 10/18/2015   Procedure: CARDIOVERSION;  Surgeon: Jerline Pain, MD;  Location: [facility];  Service: Cardiovascular;  Laterality: N/A;  . CARDIOVERSION N/A 02/20/2017   Procedure: CARDIOVERSION;  Surgeon: Pixie Casino, MD;  Location: [facility] ENDOSCOPY;  Service: Cardiovascular;  Laterality: N/A;  . CARDIOVERSION N/A 06/18/2017   Procedure: CARDIOVERSION;  Surgeon: Sanda Klein, MD;  Location: [facility] ENDOSCOPY;  Service: Cardiovascular;  Laterality: N/A;  . KNEE SURGERY  2013  . MASTECTOMY  1998   left side    There were no vitals filed for this visit.  Subjective Assessment - 11/23/17 1226    Subjective  Pt states she is doing ok. "I feel stronger.  I was able to walk on uneven ground at the  [place] with my cane and didn't need to hold onto my daughter.  That's the first time I've been able to do that."    Patient is accompained by:  Family member    Pertinent History  HTN, temporal arteritis, Afib, CKD, MRSA, polymyalgia rheumatic, macular degeneration, osteoporosis, skin cancer, and R TKA.      Limitations  Standing;Walking    Patient Stated Goals  To improve balance; pt is now fearful of going out of her condo    Currently in Pain?  No/denies                       [facility] Adult PT Treatment/Exercise - 11/23/17 0001      Ambulation/Gait   Ambulation/Gait  Yes    Assistive device  Straight cane    Gait Comments  obstacle course: pool noodle step overs x 4, 360 turn Lt/Rt, ball pick up off floor and transfer over Lt/Rt shoulder and replace on floor      Exercises   Exercises  Lumbar;Knee/Hip      Lumbar Exercises: Aerobic   UBE (Upper Arm Bike)  level 3 x 10'      Lumbar Exercises: Standing   Other Standing Lumbar Exercises  tandem stance x 30 sec 2 reps bil  PT noted 0-3 balance losses with single UE support need each     Knee/Hip Exercises: Standing   Hip Flexion  Stengthening;Both;20 reps    Hip Flexion Limitations  4# ankle weights, on foam pad, toe touches on treadmill edge    Forward Step Up  Both;Hand Hold: 2;2 sets   5 reps   Functional Squat  5 reps;3 sets    Functional Squat Limitations  on foam pad      Knee/Hip Exercises: Seated   Long Arc Quad  Strengthening;Both;2 sets;15 reps    Long Arc Quad Weight  4 lbs.    Marching  Strengthening;Both;2 sets;15 reps               PT Short Term Goals - 10/28/17 1422      PT SHORT TERM GOAL #1   Title  Pt will be independent with intial HEP and report return to walking program with daughter up to 1 mile.    Time  4    Period  Weeks    Status  Achieved   Pt does not want to walk x 1 mile again.     PT SHORT TERM GOAL #2   Title  Pt will improve gait velocity with cane to >/= 2.62  ft/sec    Status  Deferred   gait velocity not assessed and not a goal for Pt     PT SHORT TERM GOAL #3   Title  Pt will improve BERG score to >/= 36/56 to decrease falls risk    Baseline  28/56    Time  4    Period  Weeks    Status  Achieved   Pt achieved 39/56 10/28/17     PT SHORT TERM GOAL #4   Title  Pt will improve LE strength as indicated by ability to perform five time sit to stand to </= 25 seconds with supervision and decrease incidence of posterior LOB    Baseline  32 seconds with min A due to 4 LOB posterior    Time  4    Period  Weeks    Status  On-going   Pt slower than baseline today. Focusing on proper performance which is slowing her down.       PT Long Term Goals - 11/23/17 1305      PT LONG TERM GOAL #5   Title  Pt will ambulate x 300' over even outdoor surfaces, curb, and negotiate 2 stairs with one rail and cane with supervision     Time  8    Period  Weeks    Status  On-going            Plan - 11/23/17 1305    Clinical Impression Statement  Pt is displaying improved gait and balance performance with higher level activities.  PT set up obstacle course today with obstacle step overs, step ups to simulate curbs, turning 360, object pick ups from floor and object transfer with trunk rotations.  Pt performed with supervision and single point cane without balance loss.  She needed min verbal cueing to get closer to obstacles before stepping over to avoid stepping on obstacle.  Pt also performed stagger tandem stance balance trials with balance losses ranging from 0-3 over 30 sec period.  She will continue to benefit from skilled PT to address gait, balance and ther ex to increase her safety and independence in the community.    Rehab Potential  Good    PT Frequency  2x / week    PT Duration  6 weeks    PT Treatment/Interventions  ADLs/Self Care Home Management;Aquatic Therapy;DME Instruction;Gait training;Stair training;Functional mobility  training;Therapeutic activities;Therapeutic exercise;Balance training;Neuromuscular re-education;Patient/family education    PT Next Visit Plan  continue balance training, community ambulation, stair training, challenged timed stance activities    PT Home Exercise Plan  [id number]     Consulted and Agree with Plan of Care  Patient       Patient will benefit from skilled therapeutic intervention in order to improve the following deficits and impairments:  Abnormal gait, Decreased balance, Decreased strength, Difficulty walking  Visit Diagnosis: Unsteadiness on feet  Difficulty in walking, not elsewhere classified  Repeated falls  Muscle weakness (generalized)     Problem List Patient Active Problem List   Diagnosis Date Noted  . Fusion beats 08/03/2017  . Atypical atrial flutter ([place])   . Persistent atrial fibrillation 08/19/2016  . On anticoagulant therapy 07/10/2016  . Renal insufficiency 12/07/2015  . PAF (paroxysmal atrial fibrillation) ([place])   . Atrial fibrillation with RVR ([place]) 08/31/2015  . CKD (chronic kidney disease) stage 3, GFR 30-59 ml/min (HCC) 08/31/2015  . Essential hypertension 07/16/2009  . TEMPORAL ARTERITIS 07/16/2009  . POLYMYALGIA RHEUMATICA 07/16/2009  . MRSA 07/02/2009  . PYOGENIC ARTHRITIS, LOWER LEG 07/02/2009    Baruch Merl, PT 11/23/17 1:08 PM   [facility] [address] [place], [place], [address] Phone: [phone]   Fax:  [phone]  Name: Allison Norman MRN: [id number] Date of Birth: 03/07/1924

## 2017-11-24 DIAGNOSIS — H353112 Nonexudative age-related macular degeneration, right eye, intermediate dry stage: Secondary | ICD-10-CM | POA: Diagnosis not present

## 2017-11-24 DIAGNOSIS — H353211 Exudative age-related macular degeneration, right eye, with active choroidal neovascularization: Secondary | ICD-10-CM | POA: Diagnosis not present

## 2017-11-24 DIAGNOSIS — H353122 Nonexudative age-related macular degeneration, left eye, intermediate dry stage: Secondary | ICD-10-CM | POA: Diagnosis not present

## 2017-11-24 DIAGNOSIS — H353222 Exudative age-related macular degeneration, left eye, with inactive choroidal neovascularization: Secondary | ICD-10-CM | POA: Diagnosis not present

## 2017-11-25 ENCOUNTER — Encounter: Payer: Medicare Other | Admitting: Physical Therapy

## 2017-11-25 DIAGNOSIS — F411 Generalized anxiety disorder: Secondary | ICD-10-CM | POA: Diagnosis not present

## 2017-11-25 DIAGNOSIS — L299 Pruritus, unspecified: Secondary | ICD-10-CM | POA: Diagnosis not present

## 2017-11-25 DIAGNOSIS — R609 Edema, unspecified: Secondary | ICD-10-CM | POA: Diagnosis not present

## 2017-11-26 ENCOUNTER — Telehealth: Payer: Self-pay | Admitting: Internal Medicine

## 2017-11-26 NOTE — Telephone Encounter (Signed)
She has been on amlodipine for a while and her edema had resolved last time. May need a diuretic. Would recommend office visit with me or APP.  Dr. Lemmie Evens

## 2017-11-26 NOTE — Telephone Encounter (Signed)
Spoke with pt who is having c/o feet/leg swelling and itching. She reports the swelling has been going for a few months but notice it has increased since starting the amlodipine, and the itching started a couple of weeks ago. She states she is concerned that she is having a reaction to the medication and would like Dr. Lysbeth Penner recommendation. Will route to MD.

## 2017-11-26 NOTE — Telephone Encounter (Signed)
Pt updated with Dr. Lysbeth Penner recommendation. Pt verbalized understanding. Appointment scheduled for 12/01/17 at 1130 with Almyra Deforest, PA

## 2017-11-26 NOTE — Telephone Encounter (Signed)
New Message   Pt c/o medication issue:  1. Name of Medication: amLODipine (NORVASC) 10 MG tablet  2. How are you currently taking this medication (dosage and times per day)? Take 1 tablet (10 mg total) by mouth daily.  3. Are you having a reaction (difficulty breathing--STAT)?   4. What is your medication issue?Patient believes that she is allergic to this medication. It makes her itch and have some swelling. She would like to try something else.

## 2017-11-30 ENCOUNTER — Encounter: Payer: Self-pay | Admitting: Physical Therapy

## 2017-11-30 ENCOUNTER — Ambulatory Visit: Payer: Medicare Other | Admitting: Physical Therapy

## 2017-11-30 DIAGNOSIS — R296 Repeated falls: Secondary | ICD-10-CM

## 2017-11-30 DIAGNOSIS — M6281 Muscle weakness (generalized): Secondary | ICD-10-CM

## 2017-11-30 DIAGNOSIS — R2681 Unsteadiness on feet: Secondary | ICD-10-CM

## 2017-11-30 DIAGNOSIS — R262 Difficulty in walking, not elsewhere classified: Secondary | ICD-10-CM

## 2017-11-30 NOTE — Therapy (Signed)
[facility] [address] [place], [place], [address] Phone: [phone]   Fax:  [phone]  Physical Therapy Treatment  Patient Details  Name: Allison Norman MRN: [id number] Date of Birth: June 18, 1924 Referring Provider (PT): [name], MD    Encounter Date: 11/30/2017  PT End of Session - 11/30/17 1533    Visit Number  16    Number of Visits  18    Date for PT Re-Evaluation  12/09/17    Authorization Type  Medicare A and B, BCBS  10th visit PN to MD    PT Start Time  0247    PT Stop Time  0331    PT Time Calculation (min)  44 min    Activity Tolerance  Patient tolerated treatment well    Behavior During Therapy  [facility] for tasks assessed/performed       Past Medical History:  Diagnosis Date  . Atrial fibrillation ([place]) 2017   a. s/p DCCV in 10/2015  b. recurrent in 08/2016 --> rate-control pursued.   . Cancer ([place])    Breast  . CKD (chronic kidney disease)   . GCA (giant cell arteritis) ([place])   . Hypertension   . Macular degeneration   . Osteoporosis   . PMR (polymyalgia rheumatica) (H[facility])   . Skin cancer     Past Surgical History:  Procedure Laterality Date  . CARDIOVERSION N/A 10/18/2015   Procedure: CARDIOVERSION;  Surgeon: Jerline Pain, MD;  Location: [facility];  Service: Cardiovascular;  Laterality: N/A;  . CARDIOVERSION N/A 02/20/2017   Procedure: CARDIOVERSION;  Surgeon: Pixie Casino, MD;  Location: [facility] END[facility];  Service: Cardiovascular;  Laterality: N/A;  . CARDIOVERSION N/A 06/18/2017   Procedure: CARDIOVERSION;  Surgeon: Sanda Klein, MD;  Location: [facility] ENDOSCOPY;  Service: Cardiovascular;  Laterality: N/A;  . KNEE SURGERY  2013  . MASTECTOMY  1998   left side    There were no vitals filed for this visit.  Subjective Assessment - 11/30/17 1458    Subjective  Pt states she is doing ok.  Pt noticed she almost left the house the other day without her cane which makes her  feel like she must be more confident.     Pertinent History  HTN, temporal arteritis, Afib, CKD, MRSA, polymyalgia rheumatic, macular degeneration, osteoporosis, skin cancer, and R TKA.      Limitations  Standing;Walking    Patient Stated Goals  To improve balance; pt is now fearful of going out of her condo    Currently in Pain?  No/denies                       [facility] Adult PT Treatment/Exercise - 11/30/17 0001      Ambulation/Gait   Ambulation/Gait  Yes    Assistive device  Straight cane    Gait Comments  obstacle course: pool noodle step overs x 4, step up/step down, 360 turn Lt/Rt, ball pick up off floor and transfer over Lt/Rt shoulder and replace on floor      Exercises   Exercises  Knee/Hip;Lumbar      Lumbar Exercises: Aerobic   UBE (Upper Arm Bike)  level 2 x 10' legs only      Lumbar Exercises: Standing   Other Standing Lumbar Exercises  sit to stand x 10 reps seated on foam pad      Knee/Hip Exercises: Standing   Lateral Step Up  Both;10 reps;Step Height: 4"  Forward Step Up  Both;Hand Hold: 1;15 reps    Step Down  Both;1 set;10 reps;Hand Hold: 1;Step Height: 4"          Balance Exercises - 11/30/17 1527      Balance Exercises: Standing   Other Standing Exercises  box stepping fwd/bwd/Rt/Lt with cane and supervision x 10 reps          PT Short Term Goals - 10/28/17 1422      PT SHORT TERM GOAL #1   Title  Pt will be independent with intial HEP and report return to walking program with daughter up to 1 mile.    Time  4    Period  Weeks    Status  Achieved   Pt does not want to walk x 1 mile again.     PT SHORT TERM GOAL #2   Title  Pt will improve gait velocity with cane to >/= 2.62 ft/sec    Status  Deferred   gait velocity not assessed and not a goal for Pt     PT SHORT TERM GOAL #3   Title  Pt will improve BERG score to >/= 36/56 to decrease falls risk    Baseline  28/56    Time  4    Period  Weeks    Status  Achieved   Pt  achieved 39/56 10/28/17     PT SHORT TERM GOAL #4   Title  Pt will improve LE strength as indicated by ability to perform five time sit to stand to </= 25 seconds with supervision and decrease incidence of posterior LOB    Baseline  32 seconds with min A due to 4 LOB posterior    Time  4    Period  Weeks    Status  On-going   Pt slower than baseline today. Focusing on proper performance which is slowing her down.       PT Long Term Goals - 11/23/17 1305      PT LONG TERM GOAL #5   Title  Pt will ambulate x 300' over even outdoor surfaces, curb, and negotiate 2 stairs with one rail and cane with supervision     Time  8    Period  Weeks    Status  On-going            Plan - 11/30/17 1533    Clinical Impression Statement  PT focused on community ambulation simulation tasks with step ups, step downs, multi-directional stepping.  Pt has consistent balance losses with backward stepping.  Functional balance tasks such as object pick up and twisting transfer of object to Rt/Lt and 360 standing turns are much improved without balance loss.  Pt can perform 10 sit to stands with UEs on thighs with occasional need for repeat effort when she doesn't complete the stand.  She will continue to benefit from skilled PT to address gait, balance and strength to increase safety and independence at home and in the community.    Rehab Potential  Good    PT Frequency  2x / week    PT Duration  6 weeks    PT Treatment/Interventions  ADLs/Self Care Home Management;Aquatic Therapy;DME Instruction;Gait training;Stair training;Functional mobility training;Therapeutic activities;Therapeutic exercise;Balance training;Neuromuscular re-education;Patient/family education    PT Next Visit Plan  continue balance training, community ambulation, stair training, challenged timed stance activities    PT [place] and Agree with Plan of Care  Patient  Patient will benefit from  skilled therapeutic intervention in order to improve the following deficits and impairments:  Abnormal gait, Decreased balance, Decreased strength, Difficulty walking  Visit Diagnosis: Unsteadiness on feet  Difficulty in walking, not elsewhere classified  Repeated falls  Muscle weakness (generalized)     Problem List Patient Active Problem List   Diagnosis Date Noted  . Fusion beats 08/03/2017  . Atypical atrial flutter ([place])   . Persistent atrial fibrillation 08/19/2016  . On anticoagulant therapy 07/10/2016  . Renal insufficiency 12/07/2015  . PAF (paroxysmal atrial fibrillation) ([place])   . Atrial fibrillation with RVR ([place]) 08/31/2015  . CKD (chronic kidney disease) stage 3, GFR 30-59 ml/min (HCC) 08/31/2015  . Essential hypertension 07/16/2009  . TEMPORAL ARTERITIS 07/16/2009  . POLYMYALGIA RHEUMATICA 07/16/2009  . MRSA 07/02/2009  . PYOGENIC ARTHRITIS, LOWER LEG 07/02/2009    Baruch Merl, PT 11/30/17 3:36 PM   [facility] [address], [place], [place], [address] Phone: [phone]   Fax:  [phone]  Name: AANIKA DEFOOR MRN: [id number] Date of Birth: Jul 07, 1924

## 2017-12-01 ENCOUNTER — Encounter: Payer: Self-pay | Admitting: Physician Assistant

## 2017-12-01 ENCOUNTER — Ambulatory Visit (INDEPENDENT_AMBULATORY_CARE_PROVIDER_SITE_OTHER): Payer: Medicare Other | Admitting: Physician Assistant

## 2017-12-01 VITALS — BP 142/70 | HR 61 | Ht 66.0 in | Wt 153.4 lb

## 2017-12-01 DIAGNOSIS — I480 Paroxysmal atrial fibrillation: Secondary | ICD-10-CM

## 2017-12-01 DIAGNOSIS — I4892 Unspecified atrial flutter: Secondary | ICD-10-CM | POA: Diagnosis not present

## 2017-12-01 DIAGNOSIS — N184 Chronic kidney disease, stage 4 (severe): Secondary | ICD-10-CM

## 2017-12-01 DIAGNOSIS — I10 Essential (primary) hypertension: Secondary | ICD-10-CM

## 2017-12-01 NOTE — Patient Instructions (Signed)
Medication Instructions:  You may use over the counter Claritin or Allegra for itching Continue all other medications  If you need a refill on your cardiac medications before your next appointment, please call your pharmacy.   Lab work: None ordered   Testing/Procedures: None ordered  Follow-Up: At [facility], you and your health needs are our priority.  As part of our continuing mission to provide you with exceptional heart care, we have created designated Provider Care Teams.  These Care Teams include your primary Cardiologist (physician) and Advanced Practice Providers (APPs -  Physician Assistants and Nurse Practitioners) who all work together to provide you with the care you need, when you need it. . Follow up appointment with Dr.Hilty in 4 to 6 months Call 2 months before to schedule  Elevate legs when sitting  Wear compression stockings during the day

## 2017-12-01 NOTE — Progress Notes (Addendum)
Cardiology Office Note    Date:  12/01/2017   ID:  Allison Norman, DOB Jul 05, 1924, MRN [id number]  PCP:  [name], MD  Cardiologist: Dr. Debara Pickett  Chief Complaint  Patient presents with  . Follow-up    seen for Dr. Debara Pickett.     History of Present Illness:  Allison Norman is a 84 y.o. female with PMH of macular degeneration, hypertension, CKD, history of breast cancer and skin cancer, polymyalgia rheumatica, and history of atrial flutter/afib.  Patient was originally referred to Dr. Debara Pickett for management of atrial fibrillation and leg swelling.  EKG at the time demonstrated atrial fibrillation.  She underwent cardioversion in October 2017, however had new atrial flutter in August 2018.  EKG demonstrated new atrial flutter which is different from the previous atrial fibrillation. Previous up titration of Cardizem to better control her heart rate has been unsuccessful due to symptoms associated with hypotension.  She was started on amiodarone in 2019 and underwent another successful cardioversion in June 2019.  Her last office visit with Dr. Debara Pickett was on 11/04/2017 at which time she was maintaining sinus rhythm.   Based on phone note on 11/26/2017, patient called regarding leg swelling and itching.  Patient presents today for cardiology office evaluation.  She says she has no swelling in the morning, however by nighttime, she has significant lower extremity edema.  On today's physical exam, she does not have significant edema.  I think the symptoms she is describing is venous insufficiency.  I recommend conservative management using leg elevation and a compression stocking.  She does have a compression stocking at home which she has not been using recently.  I think increasing diuretic dosage in this 84 year old patient likely will be more harmful than beneficial.  Therefore I will hold off on increasing her diuretic.  I am not sure if her recent itching is related to amlodipine as well.  The  probability is low since her itching has been going on for the past month and her amlodipine was started 4 months ago.   Past Medical History:  Diagnosis Date  . Atrial fibrillation ([place]) 2017   a. s/p DCCV in 10/2015  b. recurrent in 08/2016 --> rate-control pursued.   . Cancer ([place])    Breast  . CKD (chronic kidney disease)   . GCA (giant cell arteritis) ([place])   . Hypertension   . Macular degeneration   . Osteoporosis   . PMR (polymyalgia rheumatica) (H[facility])   . Skin cancer     Past Surgical History:  Procedure Laterality Date  . CARDIOVERSION N/A 10/18/2015   Procedure: CARDIOVERSION;  Surgeon: Jerline Pain, MD;  Location: [facility];  Service: Cardiovascular;  Laterality: N/A;  . CARDIOVERSION N/A 02/20/2017   Procedure: CARDIOVERSION;  Surgeon: Pixie Casino, MD;  Location: [facility] ENDOSCOPY;  Service: Cardiovascular;  Laterality: N/A;  . CARDIOVERSION N/A 06/18/2017   Procedure: CARDIOVERSION;  Surgeon: Sanda Klein, MD;  Location: [facility] ENDOSCOPY;  Service: Cardiovascular;  Laterality: N/A;  . KNEE SURGERY  2013  . MASTECTOMY  1998   left side    Current Medications: Outpatient Medications Prior to Visit  Medication Sig Dispense Refill  . amiodarone (PACERONE) 200 MG tablet Take 1 tablet (200 mg total) by mouth daily. 90 tablet 3  . amLODipine (NORVASC) 10 MG tablet Take 1 tablet (10 mg total) by mouth daily. 30 tablet 11  . Bevacizumab (AVASTIN IV) Eye injection every 6 weeks    . cloNIDine (CATAPRES)  0.2 MG tablet TAKE 1 TABLET BY MOUTH THREE TIMES DAILY 270 tablet 1  . diazepam (VALIUM) 2 MG tablet Take 2 mg by mouth at bedtime as needed (for sleep).   0  . ELIQUIS 2.5 MG TABS tablet TAKE 1 TABLET BY MOUTH TWICE DAILY 180 tablet 1  . furosemide (LASIX) 80 MG tablet Take 0.5 tablets (40 mg total) by mouth daily. Per PCP    . losartan (COZAAR) 100 MG tablet Take 100 mg by mouth daily. Per PCP     . metoprolol succinate (TOPROL-XL) 25 MG 24 hr tablet Take 0.5 tablets  (12.5 mg total) by mouth daily. 15 tablet 11  . Multiple Vitamins-Minerals (PRESERVISION AREDS 2) CAPS Take 1 capsule by mouth daily.    [name] pyridoxine (B-6) 100 MG tablet Take 100 mg by mouth daily.    . predniSONE (DELTASONE) 5 MG tablet Take 5 mg by mouth daily.  0   No facility-administered medications prior to visit.      Allergies:   Codeine and Penicillins   Social History   Socioeconomic History  . Marital status: Married    Spouse name: Not on file  . Number of children: 2  . Years of education: Not on file  . Highest education level: Not on file  Occupational History  . Not on file  Social Needs  . Financial resource strain: Not on file  . Food insecurity:    Worry: Not on file    Inability: Not on file  . Transportation needs:    Medical: Not on file    Non-medical: Not on file  Tobacco Use  . Smoking status: Former Smoker    Last attempt to quit: 08/31/1966    Years since quitting: 51.2  . Smokeless tobacco: Never Used  Substance and Sexual Activity  . Alcohol use: No  . Drug use: No  . Sexual activity: Not on file  Lifestyle  . Physical activity:    Days per week: Not on file    Minutes per session: Not on file  . Stress: Not on file  Relationships  . Social connections:    Talks on phone: Not on file    Gets together: Not on file    Attends religious service: Not on file    Active member of club or organization: Not on file    Attends meetings of clubs or organizations: Not on file    Relationship status: Not on file  Other Topics Concern  . Not on file  Social History Narrative   epworth sleepiness scale = 8 (08/31/15)     Family History:  The patient's family history includes Breast cancer in her daughter; Heart disease in her sister and sister; Kidney failure in her father; Stroke in her mother.   ROS:   Please see the history of present illness.    ROS All other systems reviewed and are negative.   PHYSICAL EXAM:   VS:  BP (!) 142/70    Pulse 61   Ht 5\' 6"  (1.676 m)   Wt 153 lb 6.4 oz (69.6 kg)   SpO2 95%   BMI 24.76 kg/m    GEN: Well nourished, well developed, in no acute distress  HEENT: normal  Neck: no JVD, carotid bruits, or masses Cardiac: regularly irregular; no rubs, or gallops,no edema   2/6 systolic murmur near RUSB Respiratory:  clear to auscultation bilaterally, normal work of breathing GI: soft, nontender, nondistended, + BS MS: no deformity or atrophy  Skin: warm and dry, no rash Neuro:  Alert and Oriented x 3, Strength and sensation are intact Psych: euthymic mood, full affect  Wt Readings from Last 3 Encounters:  12/01/17 153 lb 6.4 oz (69.6 kg)  10/08/17 152 lb 14.4 oz (69.4 kg)  08/03/17 150 lb 9.6 oz (68.3 kg)      Studies/Labs Reviewed:   EKG:  EKG is not ordered today.    Recent Labs: 04/15/2017: ALT 19; TSH 1.560 06/10/2017: BUN 24; Creatinine, Ser 1.71; Hemoglobin 13.3; Platelets 271; Potassium 4.4; Sodium 135   Lipid Panel    Component Value Date/Time   CHOL  07/04/2009 0418    81        ATP III CLASSIFICATION:  <200     mg/dL   Desirable  200-239  mg/dL   Borderline High  >=240    mg/dL   High          TRIG 64 07/04/2009 0418   HDL 18 (L) 07/04/2009 0418   CHOLHDL 4.5 07/04/2009 0418   VLDL 13 07/04/2009 0418   LDLCALC  07/04/2009 0418    50        Total Cholesterol/HDL:CHD Risk Coronary Heart Disease Risk Table                     Men   Women  1/2 Average Risk   3.4   3.3  Average Risk       5.0   4.4  2 X Average Risk   9.6   7.1  3 X Average Risk  23.4   11.0        Use the calculated Patient Ratio above and the CHD Risk Table to determine the patient's CHD Risk.        ATP III CLASSIFICATION (LDL):  <100     mg/dL   Optimal  100-129  mg/dL   Near or Above                    Optimal  130-159  mg/dL   Borderline  160-189  mg/dL   High  >190     mg/dL   Very High    Additional studies/ records that were reviewed today include:   Echo 09/13/2015 LV EF: 60% -    65% Study Conclusions  - Left ventricle: The cavity size was normal. Wall thickness was   normal. Systolic function was normal. The estimated ejection   fraction was in the range of 60% to 65%. Wall motion was normal;   there were no regional wall motion abnormalities. Indeterminant   diastolic function (atrial fibrillation). - Aortic valve: Trileaflet; moderately calcified leaflets. There   was no stenosis. - Mitral valve: Moderately calcified annulus. There was mild   regurgitation. - Left atrium: The atrium was moderately dilated. - Right ventricle: The cavity size was normal. Systolic function   was normal. - Right atrium: The atrium was moderately dilated. - Tricuspid valve: Peak RV-RA gradient (S): 34 mm Hg. - Pulmonary arteries: PA peak pressure: 42 mm Hg (S). - Systemic veins: IVC measured 2.6 cm with > 50% respirophasic   variation, suggesting RA pressure 8 mmHg.  Impressions:  - The patient was in atrial fibrillation. Normal LV size with EF   60-65%. Normal RV size and systolic function. Mild mitral   regurgitation. Moderate biatrial enlargement. Mild pulmonary   hypertension.    ASSESSMENT:    1. Paroxysmal atrial fibrillation (HCC)   2. Essential  hypertension   3. CKD (chronic kidney disease), stage IV ([place])   4. Atrial flutter, unspecified type ([place])      PLAN:  In order of problems listed above:  1. Paroxysmal atrial fibrillation/atrial flutter: She was in sinus rhythm on last EKG on 11/04/2017, she does have very frequent PACs.  Her heart rate is irregular on physical exam, however I am not sure if she is in sinus rhythm with PAC versus atrial flutter.  We did not get a EKG as this is unlikely to change her management.  Continue amiodarone and metoprolol.  On low-dose Eliquis due to her advanced age and renal function  2. Hypertension: Blood pressure well controlled on current therapy.  3. CKD stage IV: Stable on last lab work.    Medication  Adjustments/Labs and Tests Ordered: Current medicines are reviewed at length with the patient today.  Concerns regarding medicines are outlined above.  Medication changes, Labs and Tests ordered today are listed in the Patient Instructions below. Patient Instructions  Medication Instructions:  You may use over the counter Claritin or Allegra for itching Continue all other medications  If you need a refill on your cardiac medications before your next appointment, please call your pharmacy.   Lab work: None ordered   Testing/Procedures: None ordered  Follow-Up: At [facility], you and your health needs are our priority.  As part of our continuing mission to provide you with exceptional heart care, we have created designated Provider Care Teams.  These Care Teams include your primary Cardiologist (physician) and Advanced Practice Providers (APPs -  Physician Assistants and Nurse Practitioners) who all work together to provide you with the care you need, when you need it. . Follow up appointment with Dr.Hilty in 4 to 6 months Call 2 months before to schedule  Elevate legs when sitting  Wear compression stockings during the day      Hilbert Corrigan, [place]  12/01/2017 2:53 PM    [address] [place], [place], [place]  [address] Phone: [phone]; Fax: [phone]

## 2017-12-07 ENCOUNTER — Encounter: Payer: Self-pay | Admitting: Physical Therapy

## 2017-12-07 ENCOUNTER — Ambulatory Visit: Payer: Medicare Other | Attending: Family Medicine | Admitting: Physical Therapy

## 2017-12-07 DIAGNOSIS — M6281 Muscle weakness (generalized): Secondary | ICD-10-CM | POA: Diagnosis not present

## 2017-12-07 DIAGNOSIS — R296 Repeated falls: Secondary | ICD-10-CM | POA: Insufficient documentation

## 2017-12-07 DIAGNOSIS — R262 Difficulty in walking, not elsewhere classified: Secondary | ICD-10-CM

## 2017-12-07 DIAGNOSIS — R2681 Unsteadiness on feet: Secondary | ICD-10-CM | POA: Diagnosis not present

## 2017-12-07 NOTE — Therapy (Signed)
[facility] [address] [place], [place], [address] Phone: [phone]   Fax:  [phone]  Physical Therapy Treatment  Patient Details  Name: Allison Norman MRN: [id number] Date of Birth: [birth year] Referring Provider (PT): [name], MD    Encounter Date: 12/07/2017  PT End of Session - 12/07/17 1408    Visit Number  17    Number of Visits  18    Date for PT Re-Evaluation  12/09/17    Authorization Type  Medicare A and B, BCBS  10th visit PN to MD    PT Start Time  1400    PT Stop Time  1444    PT Time Calculation (min)  44 min    Activity Tolerance  Patient tolerated treatment well    Behavior During Therapy  [facility] for tasks assessed/performed       Past Medical History:  Diagnosis Date  . Atrial fibrillation ([place]) 2017   a. s/p DCCV in 10/2015  b. recurrent in 08/2016 --> rate-control pursued.   . Cancer ([place])    Breast  . CKD (chronic kidney disease)   . GCA (giant cell arteritis) ([place])   . Hypertension   . Macular degeneration   . Osteoporosis   . PMR (polymyalgia rheumatica) (HCC)   . Skin cancer     Past Surgical History:  Procedure Laterality Date  . CARDIOVERSION N/A 10/18/2015   Procedure: CARDIOVERSION;  Surgeon: Jerline Pain, MD;  Location: [facility];  Service: Cardiovascular;  Laterality: N/A;  . CARDIOVERSION N/A 02/20/2017   Procedure: CARDIOVERSION;  Surgeon: Pixie Casino, MD;  Location[facility] ENDOSCOPY;  Service: Cardiovascular;  Laterality: N/A;  . CARDIOVERSION N/A 06/18/2017   Procedure: CARDIOVERSION;  Surgeon: Sanda Klein, MD;  Location: [facility] ENDOSCOPY;  Service: Cardiovascular;  Laterality: N/A;  . KNEE SURGERY  2013  . MASTECTOMY  1998   left side    There were no vitals filed for this visit.  Subjective Assessment - 12/07/17 1403    Subjective  Pt feels a little stiff today.  Has been doing her exercises but otherwise had a pretty inactive weekend.  Pt states  she feels steady in the house and a little bit better when she leaves the house to run errands.    Patient is accompained by:  Family member    Pertinent History  HTN, temporal arteritis, Afib, CKD, MRSA, polymyalgia rheumatic, macular degeneration, osteoporosis, skin cancer, and R TKA.      Limitations  Standing;Walking    Patient Stated Goals  To improve balance; pt is now fearful of going out of her condo                       [facility] Adult PT Treatment/Exercise - 12/07/17 0001      Exercises   Exercises  Lumbar;Knee/Hip      Lumbar Exercises: Aerobic   UBE (Upper Arm Bike)  level 2 x 10' legs only   PT present to review progress     Lumbar Exercises: Standing   Heel Raises  10 reps   with toe raises, bil UE support   Functional Squats  20 reps    Other Standing Lumbar Exercises  high knee marching bil UE support x 20 rps      Knee/Hip Exercises: Seated   Long Arc Quad  Strengthening;Both;15 reps;Weights    Long Arc Quad Weight  3 lbs.    [place]  Limitations  2x15    Marching  Strengthening;20 reps;2 sets;Weights    Marching Weights  3 lbs.          Balance Exercises - 12/07/17 1422      Balance Exercises: Standing   Tandem Stance  Eyes open;Intermittent upper extremity support;30 secs;2 reps    Standing, One Foot on a Step  Eyes open;6 inch;30 secs;2 reps   single UE support   Stepping Strategy  Anterior;Posterior;Lateral;10 reps;UE support    Gait with Head Turns  Forward   30 feet x 3 with cane         PT Short Term Goals - 10/28/17 1422      PT SHORT TERM GOAL #1   Title  Pt will be independent with intial HEP and report return to walking program with daughter up to 1 mile.    Time  4    Period  Weeks    Status  Achieved   Pt does not want to walk x 1 mile again.     PT SHORT TERM GOAL #2   Title  Pt will improve gait velocity with cane to >/= 2.62 ft/sec    Status  Deferred   gait velocity not assessed and not a goal for Pt      PT SHORT TERM GOAL #3   Title  Pt will improve BERG score to >/= 36/56 to decrease falls risk    Baseline  28/56    Time  4    Period  Weeks    Status  Achieved   Pt achieved 39/56 10/28/17     PT SHORT TERM GOAL #4   Title  Pt will improve LE strength as indicated by ability to perform five time sit to stand to </= 25 seconds with supervision and decrease incidence of posterior LOB    Baseline  32 seconds with min A due to 4 LOB posterior    Time  4    Period  Weeks    Status  On-going   Pt slower than baseline today. Focusing on proper performance which is slowing her down.       PT Long Term Goals - 11/23/17 1305      PT LONG TERM GOAL #5   Title  Pt will ambulate x 300' over even outdoor surfaces, curb, and negotiate 2 stairs with one rail and cane with supervision     Time  8    Period  Weeks    Status  On-going            Plan - 12/07/17 1444    Clinical Impression Statement  Pt was more unsteady today than at prior visits.  PT suspects Pt is down going into holidays with the loss of her husband earlier this year.  She admitted she didn't leave the house the last 3 days.  PT focused on timed balance tasks due to Pt's low energy.  Her TUG Test today over two trials were 27 and 23 seconds.  She will continue to benefit from skilled PT to address balance, strength and gait deficits to improve safety and increase independence in the home and community.    Rehab Potential  Good    PT Frequency  2x / week    PT Duration  6 weeks    PT Treatment/Interventions  ADLs/Self Care Home Management;Aquatic Therapy;DME Instruction;Gait training;Stair training;Functional mobility training;Therapeutic activities;Therapeutic exercise;Balance training;Neuromuscular re-education;Patient/family education    PT Next Visit Plan  continue balance training,  community ambulation, stair training, challenged timed stance activities    PT [place] and Agree with  Plan of Care  Patient       Patient will benefit from skilled therapeutic intervention in order to improve the following deficits and impairments:  Abnormal gait, Decreased balance, Decreased strength, Difficulty walking  Visit Diagnosis: Unsteadiness on feet  Difficulty in walking, not elsewhere classified  Repeated falls  Muscle weakness (generalized)     Problem List Patient Active Problem List   Diagnosis Date Noted  . Fusion beats 08/03/2017  . Atypical atrial flutter ([place])   . Persistent atrial fibrillation 08/19/2016  . On anticoagulant therapy 07/10/2016  . Renal insufficiency 12/07/2015  . PAF (paroxysmal atrial fibrillation) ([place])   . Atrial fibrillation with RVR ([place]) 08/31/2015  . CKD (chronic kidney disease) stage 3, GFR 30-59 ml/min (HCC) 08/31/2015  . Essential hypertension 07/16/2009  . TEMPORAL ARTERITIS 07/16/2009  . POLYMYALGIA RHEUMATICA 07/16/2009  . MRSA 07/02/2009  . PYOGENIC ARTHRITIS, LOWER LEG 07/02/2009   Baruch Merl, PT 12/07/17 2:46 PM   [facility] [address] [place], [place], [address] Phone: [phone]   Fax:  [phone]  Name: FRANCESKA STRAHM MRN: [id number] Date of Birth: Dec 21, 1924

## 2017-12-09 ENCOUNTER — Encounter: Payer: Self-pay | Admitting: Physical Therapy

## 2017-12-09 ENCOUNTER — Ambulatory Visit: Payer: Medicare Other | Admitting: Physical Therapy

## 2017-12-09 DIAGNOSIS — R296 Repeated falls: Secondary | ICD-10-CM | POA: Diagnosis not present

## 2017-12-09 DIAGNOSIS — M6281 Muscle weakness (generalized): Secondary | ICD-10-CM

## 2017-12-09 DIAGNOSIS — R262 Difficulty in walking, not elsewhere classified: Secondary | ICD-10-CM

## 2017-12-09 DIAGNOSIS — R2681 Unsteadiness on feet: Secondary | ICD-10-CM | POA: Diagnosis not present

## 2017-12-09 NOTE — Therapy (Signed)
[facility] [address] [place], [place], [address] Phone: [phone]   Fax:  [phone]  Physical Therapy Treatment  Patient Details  Name: Allison Norman MRN: [id number] Date of Birth: 07/18/1924 Referring Provider (PT): [name], MD    Encounter Date: 12/09/2017  PT End of Session - 12/09/17 1406    Visit Number  18    Number of Visits  18    Date for PT Re-Evaluation  12/09/17    Authorization Type  Medicare A and B, BCBS  10th visit PN to MD    PT Start Time  1402    PT Stop Time  1440    PT Time Calculation (min)  38 min    Activity Tolerance  Patient tolerated treatment well    Behavior During Therapy  [facility] for tasks assessed/performed       Past Medical History:  Diagnosis Date  . Atrial fibrillation ([place]) 2017   a. s/p DCCV in 10/2015  b. recurrent in 08/2016 --> rate-control pursued.   . Cancer ([place])    Breast  . CKD (chronic kidney disease)   . GCA (giant cell arteritis) ([place])   . Hypertension   . Macular degeneration   . Osteoporosis   . PMR (polymyalgia rheumatica) (HCC)   . Skin cancer     Past Surgical History:  Procedure Laterality Date  . CARDIOVERSION N/A 10/18/2015   Procedure: CARDIOVERSION;  Surgeon: Jerline Pain, MD;  Location: [place];  Service: Cardiovascular;  Laterality: N/A;  . CARDIOVERSION N/A 02/20/2017   Procedure: CARDIOVERSION;  Surgeon: Pixie Casino, MD;  Location: [facility] ENDOSCOPY;  Service: Cardiovascular;  Laterality: N/A;  . CARDIOVERSION N/A 06/18/2017   Procedure: CARDIOVERSION;  Surgeon: Sanda Klein, MD;  Location: [place] ENDOSCOPY;  Service: Cardiovascular;  Laterality: N/A;  . KNEE SURGERY  2013  . MASTECTOMY  1998   left side    There were no vitals filed for this visit.  Subjective Assessment - 12/09/17 1403    Subjective  Pt feels less stiff today.  Was able to run two errands after last visit and didn't get too tired.    Pertinent  History  HTN, temporal arteritis, Afib, CKD, MRSA, polymyalgia rheumatic, macular degeneration, osteoporosis, skin cancer, and R TKA.      Limitations  Standing;Walking    Patient Stated Goals  To improve balance; pt is now fearful of going out of her condo    Currently in Pain?  No/denies                       [facility] Adult PT Treatment/Exercise - 12/09/17 0001      Ambulation/Gait   Ambulation/Gait  Yes    Ambulation Distance (Feet)  597 Feet   in 6 min   Assistive device  Straight cane    Gait Pattern  Step-through pattern    Ambulation Surface  Level;Outdoor    Curb  5: Supervision   with cane, x 3 reps up/down   Gait Comments  6 min walk test      Exercises   Exercises  Lumbar;Knee/Hip      Lumbar Exercises: Aerobic   UBE (Upper Arm Bike)  --   PT present to review progress   Nustep  level 2 x 10'   PT present to review progress     Knee/Hip Exercises: Standing   Forward Step Up  Both;10 reps;Step Height: 6";Hand Hold: 2  Step Down  10 reps;Both;1 set;Hand Hold: 2;Step Height: 6"               PT Short Term Goals - 10/28/17 1422      PT SHORT TERM GOAL #1   Title  Pt will be independent with intial HEP and report return to walking program with daughter up to 1 mile.    Time  4    Period  Weeks    Status  Achieved   Pt does not want to walk x 1 mile again.     PT SHORT TERM GOAL #2   Title  Pt will improve gait velocity with cane to >/= 2.62 ft/sec    Status  Deferred   gait velocity not assessed and not a goal for Pt     PT SHORT TERM GOAL #3   Title  Pt will improve BERG score to >/= 36/56 to decrease falls risk    Baseline  28/56    Time  4    Period  Weeks    Status  Achieved   Pt achieved 39/56 10/28/17     PT SHORT TERM GOAL #4   Title  Pt will improve LE strength as indicated by ability to perform five time sit to stand to </= 25 seconds with supervision and decrease incidence of posterior LOB    Baseline  32 seconds with  min A due to 4 LOB posterior    Time  4    Period  Weeks    Status  On-going   Pt slower than baseline today. Focusing on proper performance which is slowing her down.       PT Long Term Goals - 11/23/17 1305      PT LONG TERM GOAL #5   Title  Pt will ambulate x 300' over even outdoor surfaces, curb, and negotiate 2 stairs with one rail and cane with supervision     Time  8    Period  Weeks    Status  On-going            Plan - 12/09/17 1421    Clinical Impression Statement  Pt continues to make progress with sit to stand and step downs with cane.  She becomes more steady with increased reps as she gets her sequencing and cueing correct.  She needs occasional cueing for cane placement out in front of her during step downs to find stability upon stepping down.  Pt is getting better at getting COG forward with sit to stands but continues to need hands on thighs to stand.  She navigates simulation of curbs, stepping over objects, picking items up off floor with supervision only and use of cane.  6 min walk test was 597 feet outdoor on level surface with supervision and use of cane.  She reports she feels steadier and hasn't had falls since starting PT.  She anticipates being ready to D/C to HEP next week when cert date approaches.    Rehab Potential  Good    PT Frequency  2x / week    PT Duration  6 weeks    PT Treatment/Interventions  ADLs/Self Care Home Management;Aquatic Therapy;DME Instruction;Gait training;Stair training;Functional mobility training;Therapeutic activities;Therapeutic exercise;Balance training;Neuromuscular re-education;Patient/family education    PT Next Visit Plan  continue balance training, community ambulation, stair training, challenged timed stance activities    PT [place] and Agree with Plan of Care  Patient  Patient will benefit from skilled therapeutic intervention in order to improve the following deficits and  impairments:  Abnormal gait, Decreased balance, Decreased strength, Difficulty walking  Visit Diagnosis: Unsteadiness on feet  Difficulty in walking, not elsewhere classified  Repeated falls  Muscle weakness (generalized)     Problem List Patient Active Problem List   Diagnosis Date Noted  . Fusion beats 08/03/2017  . Atypical atrial flutter ([place])   . Persistent atrial fibrillation 08/19/2016  . On anticoagulant therapy 07/10/2016  . Renal insufficiency 12/07/2015  . PAF (paroxysmal atrial fibrillation) ([place])   . Atrial fibrillation with RVR ([place]) 08/31/2015  . CKD (chronic kidney disease) stage 3, GFR 30-59 ml/min (HCC) 08/31/2015  . Essential hypertension 07/16/2009  . TEMPORAL ARTERITIS 07/16/2009  . POLYMYALGIA RHEUMATICA 07/16/2009  . MRSA 07/02/2009  . PYOGENIC ARTHRITIS, LOWER LEG 07/02/2009    Baruch Merl, PT 12/09/17 2:44 PM   [facility] [address], [place], [place], [address] Phone: [phone]   Fax:  [phone]  Name: SUNI JARNAGIN MRN: [id number] Date of Birth: 1924-04-08

## 2017-12-14 ENCOUNTER — Ambulatory Visit: Payer: Medicare Other | Admitting: Physical Therapy

## 2017-12-14 ENCOUNTER — Encounter: Payer: Self-pay | Admitting: Physical Therapy

## 2017-12-14 DIAGNOSIS — R296 Repeated falls: Secondary | ICD-10-CM | POA: Diagnosis not present

## 2017-12-14 DIAGNOSIS — M6281 Muscle weakness (generalized): Secondary | ICD-10-CM | POA: Diagnosis not present

## 2017-12-14 DIAGNOSIS — R262 Difficulty in walking, not elsewhere classified: Secondary | ICD-10-CM

## 2017-12-14 DIAGNOSIS — R2681 Unsteadiness on feet: Secondary | ICD-10-CM

## 2017-12-14 NOTE — Therapy (Signed)
[facility] [address] [place], [place], [address] Phone: [phone]   Fax:  [phone]  Physical Therapy Treatment  Patient Details  Name: Allison Norman MRN: [id number] Date of Birth: Jan 16, 1924 Referring Provider (PT): [name], MD    Encounter Date: 12/14/2017  PT End of Session - 12/14/17 1359    Visit Number  19    Date for PT Re-Evaluation  12/09/17    Authorization Type  Medicare A and B, BCBS  10th visit PN to MD    PT Start Time  1400    PT Stop Time  1440    PT Time Calculation (min)  40 min    Activity Tolerance  Patient tolerated treatment well    Behavior During Therapy  [facility] for tasks assessed/performed       Past Medical History:  Diagnosis Date  . Atrial fibrillation ([place]) 2017   a. s/p DCCV in 10/2015  b. recurrent in 08/2016 --> rate-control pursued.   . Cancer ([place])    Breast  . CKD (chronic kidney disease)   . GCA (giant cell arteritis) ([place])   . Hypertension   . Macular degeneration   . Osteoporosis   . PMR (polymyalgia rheumatica) (HCC)   . Skin cancer     Past Surgical History:  Procedure Laterality Date  . CARDIOVERSION N/A 10/18/2015   Procedure: CARDIOVERSION;  Surgeon: Jerline Pain, MD;  Location: [place];  Service: Cardiovascular;  Laterality: N/A;  . CARDIOVERSION N/A 02/20/2017   Procedure: CARDIOVERSION;  Surgeon: Pixie Casino, MD;  Location: [facility] ENDOSCOPY;  Service: Cardiovascular;  Laterality: N/A;  . CARDIOVERSION N/A 06/18/2017   Procedure: CARDIOVERSION;  Surgeon: Sanda Klein, MD;  Location: [place] ENDOSCOPY;  Service: Cardiovascular;  Laterality: N/A;  . KNEE SURGERY  2013  . MASTECTOMY  1998   left side    There were no vitals filed for this visit.  Subjective Assessment - 12/14/17 1359    Subjective  Pt feels good. Is ready for D/C to HEP. Feels steadier and more confident at home and in community.      Pertinent History  HTN, temporal  arteritis, Afib, CKD, MRSA, polymyalgia rheumatic, macular degeneration, osteoporosis, skin cancer, and R TKA.      Limitations  Standing;Walking    Patient Stated Goals  To improve balance; pt is now fearful of going out of her condo    Currently in Pain?  No/denies         [facility] PT Assessment - 12/14/17 0001      Assessment   Medical Diagnosis  falls    Referring Provider (PT)  [name], MD     Onset Date/Surgical Date  08/31/17    Prior Therapy  after TKA      Standardized Balance Assessment   Standardized Balance Assessment  Timed Up and Go Test    Five times sit to stand comments   32 sec   with use of bil UEs     Berg Balance Test   Sit to Stand  Able to stand  independently using hands    Standing Unsupported  Able to stand safely 2 minutes    Sitting with Back Unsupported but Feet Supported on Floor or Stool  Able to sit safely and securely 2 minutes    Stand to Sit  Controls descent by using hands    Transfers  Able to transfer safely, definite need of hands  Standing Unsupported with Eyes Closed  Able to stand 10 seconds with supervision    Standing Ubsupported with Feet Together  Able to place feet together independently and stand 1 minute safely    From Standing, Reach Forward with Outstretched Arm  Can reach confidently >25 cm (10")    From Standing Position, Pick up Object from [place] to pick up shoe safely and easily    From Standing Position, Turn to Look Behind Over each Shoulder  Looks behind from both sides and weight shifts well    Turn 360 Degrees  Able to turn 360 degrees safely in 4 seconds or less    Standing Unsupported, Alternately Place Feet on Step/Stool  Able to stand independently and safely and complete 8 steps in 20 seconds    Standing Unsupported, One Foot in Front  Able to plae foot ahead of the other independently and hold 30 seconds    Standing on One Leg  Tries to lift leg/unable to hold 3 seconds but remains standing  independently    Total Score  48      Timed Up and Go Test   TUG  Normal TUG    Normal TUG (seconds)  24    TUG Comments  with straight cane                   [facility] Adult PT Treatment/Exercise - 12/14/17 0001      Ambulation/Gait   Ambulation/Gait  Yes    Ambulation Distance (Feet)  350 Feet    Assistive device  Straight cane    Gait Pattern  Step-through pattern    Ambulation Surface  Level;Indoor    Curb  5: Supervision   with cane, x 3 reps up/down   Curb Details (indicate cue type and reason)  step ups, step downs x 6" riser x 10 each leg, step overs ground obstacles x 15 reps      Exercises   Exercises  Lumbar;Knee/Hip      Lumbar Exercises: Aerobic   Nustep  level 3 x 10'               PT Short Term Goals - 10/28/17 1422      PT SHORT TERM GOAL #1   Title  Pt will be independent with intial HEP and report return to walking program with daughter up to 1 mile.    Time  4    Period  Weeks    Status  Achieved   Pt does not want to walk x 1 mile again.     PT SHORT TERM GOAL #2   Title  Pt will improve gait velocity with cane to >/= 2.62 ft/sec    Status  Deferred   gait velocity not assessed and not a goal for Pt     PT SHORT TERM GOAL #3   Title  Pt will improve BERG score to >/= 36/56 to decrease falls risk    Baseline  28/56    Time  4    Period  Weeks    Status  Achieved   Pt achieved 39/56 10/28/17     PT SHORT TERM GOAL #4   Title  Pt will improve LE strength as indicated by ability to perform five time sit to stand to </= 25 seconds with supervision and decrease incidence of posterior LOB    Baseline  32 seconds with min A due to 4 LOB posterior    Time  4  Period  Weeks    Status  On-going   Pt slower than baseline today. Focusing on proper performance which is slowing her down.       PT Long Term Goals - 12/14/17 1442      PT LONG TERM GOAL #1   Title  Pt will be independent with final HEP and will report walking up to  1.5 miles with daughter    Time  8    Period  Weeks    Status  Deferred      PT LONG TERM GOAL #3   Title  Pt will improve BERG to >/= 40/56    Time  8    Period  Weeks    Status  Achieved      PT LONG TERM GOAL #4   Title  Pt will improve five time sit to stand to >/= 18 seconds with supervision and decrease use of UE    Time  8    Period  Weeks    Status  Not Met      PT LONG TERM GOAL #5   Title  Pt will ambulate x 300' over even outdoor surfaces, curb, and negotiate 2 stairs with one rail and cane with supervision     Time  8    Period  Weeks    Status  Achieved            Plan - 12/14/17 1441    Clinical Impression Statement  Pt reports more confidence and steadiness when performing transfers and gait activities.  She has been safe and active in the community.  She performs step ups, step downs and step overs with use of cane and supervision only with independence and without loss of balance.  6 min walk test was 597 feet last visit, Berg Balance score improved to 48/56 today.  5x sit to stand was 32 sec and TUG Test was 24 sec which are still a risk of fall but Pt moves slowly with improved safety and no loss of balance during these tests.  She will D/C today to HEP with follow up as needed.    Rehab Potential  Good    PT Frequency  2x / week    PT Duration  6 weeks    PT Treatment/Interventions  ADLs/Self Care Home Management;Aquatic Therapy;DME Instruction;Gait training;Stair training;Functional mobility training;Therapeutic activities;Therapeutic exercise;Balance training;Neuromuscular re-education;Patient/family education    PT Next Visit Plan  continue balance training, community ambulation, stair training, challenged timed stance activities    PT Home Exercise Plan  [id number]     Consulted and Agree with Plan of Care  Patient       Patient will benefit from skilled therapeutic intervention in order to improve the following deficits and impairments:  Abnormal gait,  Decreased balance, Decreased strength, Difficulty walking  Visit Diagnosis: Unsteadiness on feet  Difficulty in walking, not elsewhere classified  Repeated falls  Muscle weakness (generalized)     Problem List Patient Active Problem List   Diagnosis Date Noted  . Fusion beats 08/03/2017  . Atypical atrial flutter ([place])   . Persistent atrial fibrillation 08/19/2016  . On anticoagulant therapy 07/10/2016  . Renal insufficiency 12/07/2015  . PAF (paroxysmal atrial fibrillation) ([place])   . Atrial fibrillation with RVR ([place]) 08/31/2015  . CKD (chronic kidney disease) stage 3, GFR 30-59 ml/min (HCC) 08/31/2015  . Essential hypertension 07/16/2009  . TEMPORAL ARTERITIS 07/16/2009  . POLYMYALGIA RHEUMATICA 07/16/2009  . MRSA 07/02/2009  . PYOGENIC ARTHRITIS,  LOWER LEG 07/02/2009    PHYSICAL THERAPY DISCHARGE SUMMARY  Visits from Start of Care: 19 Current functional level related to goals / functional outcomes: See above   Remaining deficits: See above   Education / Equipment: HEP Plan: Patient agrees to discharge.  Patient goals were partially met. Patient is being discharged due to being pleased with the current functional level.  ?????         Baruch Merl, PT 12/14/17 2:45 PM   [facility] [address] [place], [place], [address] Phone: [phone]   Fax:  [phone]  Name: NEVIN KOZUCH MRN: [id number] Date of Birth: 07-Dec-1924

## 2017-12-16 ENCOUNTER — Encounter: Payer: Medicare Other | Admitting: Physical Therapy

## 2017-12-21 ENCOUNTER — Encounter: Payer: Medicare Other | Admitting: Physical Therapy

## 2017-12-23 ENCOUNTER — Ambulatory Visit: Payer: Medicare Other | Admitting: Physical Therapy

## 2018-01-10 ENCOUNTER — Other Ambulatory Visit: Payer: Self-pay | Admitting: Internal Medicine

## 2018-01-12 DIAGNOSIS — H35051 Retinal neovascularization, unspecified, right eye: Secondary | ICD-10-CM | POA: Diagnosis not present

## 2018-01-12 DIAGNOSIS — H353211 Exudative age-related macular degeneration, right eye, with active choroidal neovascularization: Secondary | ICD-10-CM | POA: Diagnosis not present

## 2018-01-12 DIAGNOSIS — H35371 Puckering of macula, right eye: Secondary | ICD-10-CM | POA: Diagnosis not present

## 2018-02-01 DIAGNOSIS — C44719 Basal cell carcinoma of skin of left lower limb, including hip: Secondary | ICD-10-CM | POA: Diagnosis not present

## 2018-02-01 DIAGNOSIS — D485 Neoplasm of uncertain behavior of skin: Secondary | ICD-10-CM | POA: Diagnosis not present

## 2018-02-01 DIAGNOSIS — L821 Other seborrheic keratosis: Secondary | ICD-10-CM | POA: Diagnosis not present

## 2018-02-01 DIAGNOSIS — C4441 Basal cell carcinoma of skin of scalp and neck: Secondary | ICD-10-CM | POA: Diagnosis not present

## 2018-02-01 DIAGNOSIS — L570 Actinic keratosis: Secondary | ICD-10-CM | POA: Diagnosis not present

## 2018-02-01 DIAGNOSIS — Z85828 Personal history of other malignant neoplasm of skin: Secondary | ICD-10-CM | POA: Diagnosis not present

## 2018-02-16 DIAGNOSIS — H353211 Exudative age-related macular degeneration, right eye, with active choroidal neovascularization: Secondary | ICD-10-CM | POA: Diagnosis not present

## 2018-02-16 DIAGNOSIS — H35371 Puckering of macula, right eye: Secondary | ICD-10-CM | POA: Diagnosis not present

## 2018-02-16 DIAGNOSIS — H35051 Retinal neovascularization, unspecified, right eye: Secondary | ICD-10-CM | POA: Diagnosis not present

## 2018-03-23 DIAGNOSIS — H353112 Nonexudative age-related macular degeneration, right eye, intermediate dry stage: Secondary | ICD-10-CM | POA: Diagnosis not present

## 2018-03-23 DIAGNOSIS — H353211 Exudative age-related macular degeneration, right eye, with active choroidal neovascularization: Secondary | ICD-10-CM | POA: Diagnosis not present

## 2018-03-23 DIAGNOSIS — H353122 Nonexudative age-related macular degeneration, left eye, intermediate dry stage: Secondary | ICD-10-CM | POA: Diagnosis not present

## 2018-04-07 ENCOUNTER — Ambulatory Visit: Payer: Medicare Other | Admitting: Internal Medicine

## 2018-04-26 ENCOUNTER — Other Ambulatory Visit: Payer: Self-pay

## 2018-04-26 ENCOUNTER — Telehealth: Payer: Self-pay

## 2018-04-26 DIAGNOSIS — I4891 Unspecified atrial fibrillation: Secondary | ICD-10-CM

## 2018-04-26 MED ORDER — AMIODARONE HCL 200 MG PO TABS: 200.0000 mg | ORAL_TABLET | Freq: Every day | ORAL | 3 refills | Status: DC

## 2018-04-26 MED ORDER — AMLODIPINE BESYLATE 10 MG PO TABS: 10.0000 mg | ORAL_TABLET | Freq: Every day | ORAL | 11 refills | Status: DC

## 2018-04-26 MED ORDER — APIXABAN 2.5 MG PO TABS: 2.5000 mg | ORAL_TABLET | Freq: Two times a day (BID) | ORAL | 1 refills | Status: DC

## 2018-04-26 MED ORDER — AMLODIPINE BESYLATE 10 MG PO TABS: 10.0000 mg | ORAL_TABLET | Freq: Every day | ORAL | 4 refills | Status: DC

## 2018-04-26 NOTE — Telephone Encounter (Signed)
See [name] chart.

## 2018-04-26 NOTE — Telephone Encounter (Signed)
Thanks

## 2018-04-27 DIAGNOSIS — H353222 Exudative age-related macular degeneration, left eye, with inactive choroidal neovascularization: Secondary | ICD-10-CM | POA: Diagnosis not present

## 2018-04-27 DIAGNOSIS — H353211 Exudative age-related macular degeneration, right eye, with active choroidal neovascularization: Secondary | ICD-10-CM | POA: Diagnosis not present

## 2018-04-27 DIAGNOSIS — H353122 Nonexudative age-related macular degeneration, left eye, intermediate dry stage: Secondary | ICD-10-CM | POA: Diagnosis not present

## 2018-04-27 DIAGNOSIS — H353112 Nonexudative age-related macular degeneration, right eye, intermediate dry stage: Secondary | ICD-10-CM | POA: Diagnosis not present

## 2018-05-02 ENCOUNTER — Other Ambulatory Visit: Payer: Self-pay | Admitting: Internal Medicine

## 2018-05-02 DIAGNOSIS — I4891 Unspecified atrial fibrillation: Secondary | ICD-10-CM

## 2018-05-03 ENCOUNTER — Telehealth: Payer: Self-pay | Admitting: Internal Medicine

## 2018-05-03 DIAGNOSIS — N183 Chronic kidney disease, stage 3 (moderate): Secondary | ICD-10-CM | POA: Diagnosis not present

## 2018-05-03 DIAGNOSIS — E781 Pure hyperglyceridemia: Secondary | ICD-10-CM | POA: Diagnosis not present

## 2018-05-03 DIAGNOSIS — I4891 Unspecified atrial fibrillation: Secondary | ICD-10-CM | POA: Diagnosis not present

## 2018-05-03 DIAGNOSIS — I10 Essential (primary) hypertension: Secondary | ICD-10-CM | POA: Diagnosis not present

## 2018-05-03 DIAGNOSIS — M179 Osteoarthritis of knee, unspecified: Secondary | ICD-10-CM | POA: Diagnosis not present

## 2018-05-03 DIAGNOSIS — Z853 Personal history of malignant neoplasm of breast: Secondary | ICD-10-CM | POA: Diagnosis not present

## 2018-05-03 DIAGNOSIS — D649 Anemia, unspecified: Secondary | ICD-10-CM | POA: Diagnosis not present

## 2018-05-03 NOTE — Telephone Encounter (Signed)
New Message:    Allison Norman needs the correct dose of pt;s Metoprolol and Amlodipine. Pt is in the office now please.i

## 2018-05-03 NOTE — Telephone Encounter (Signed)
Allison Norman with Dr. Melinda Crutch' office called with med doses as we have them from 11/2017

## 2018-05-10 ENCOUNTER — Telehealth: Payer: Self-pay | Admitting: Internal Medicine

## 2018-05-10 NOTE — Telephone Encounter (Signed)
LM for patient to call back to discuss upcoming appt on 05/12/2018. This is a routine 6 month visit. Will need to be changed to virtual visit - telephone or video.

## 2018-05-12 ENCOUNTER — Telehealth: Payer: Medicare Other | Admitting: Internal Medicine

## 2018-05-12 IMAGING — DX DG CHEST 2V
2 series · 2 of 2 positions shown · non-contrast
Comparison: 10/16/2015

CLINICAL DATA: Atrial fibrillation/flutter.

EXAM:
CHEST  2 VIEW

[chest lat]
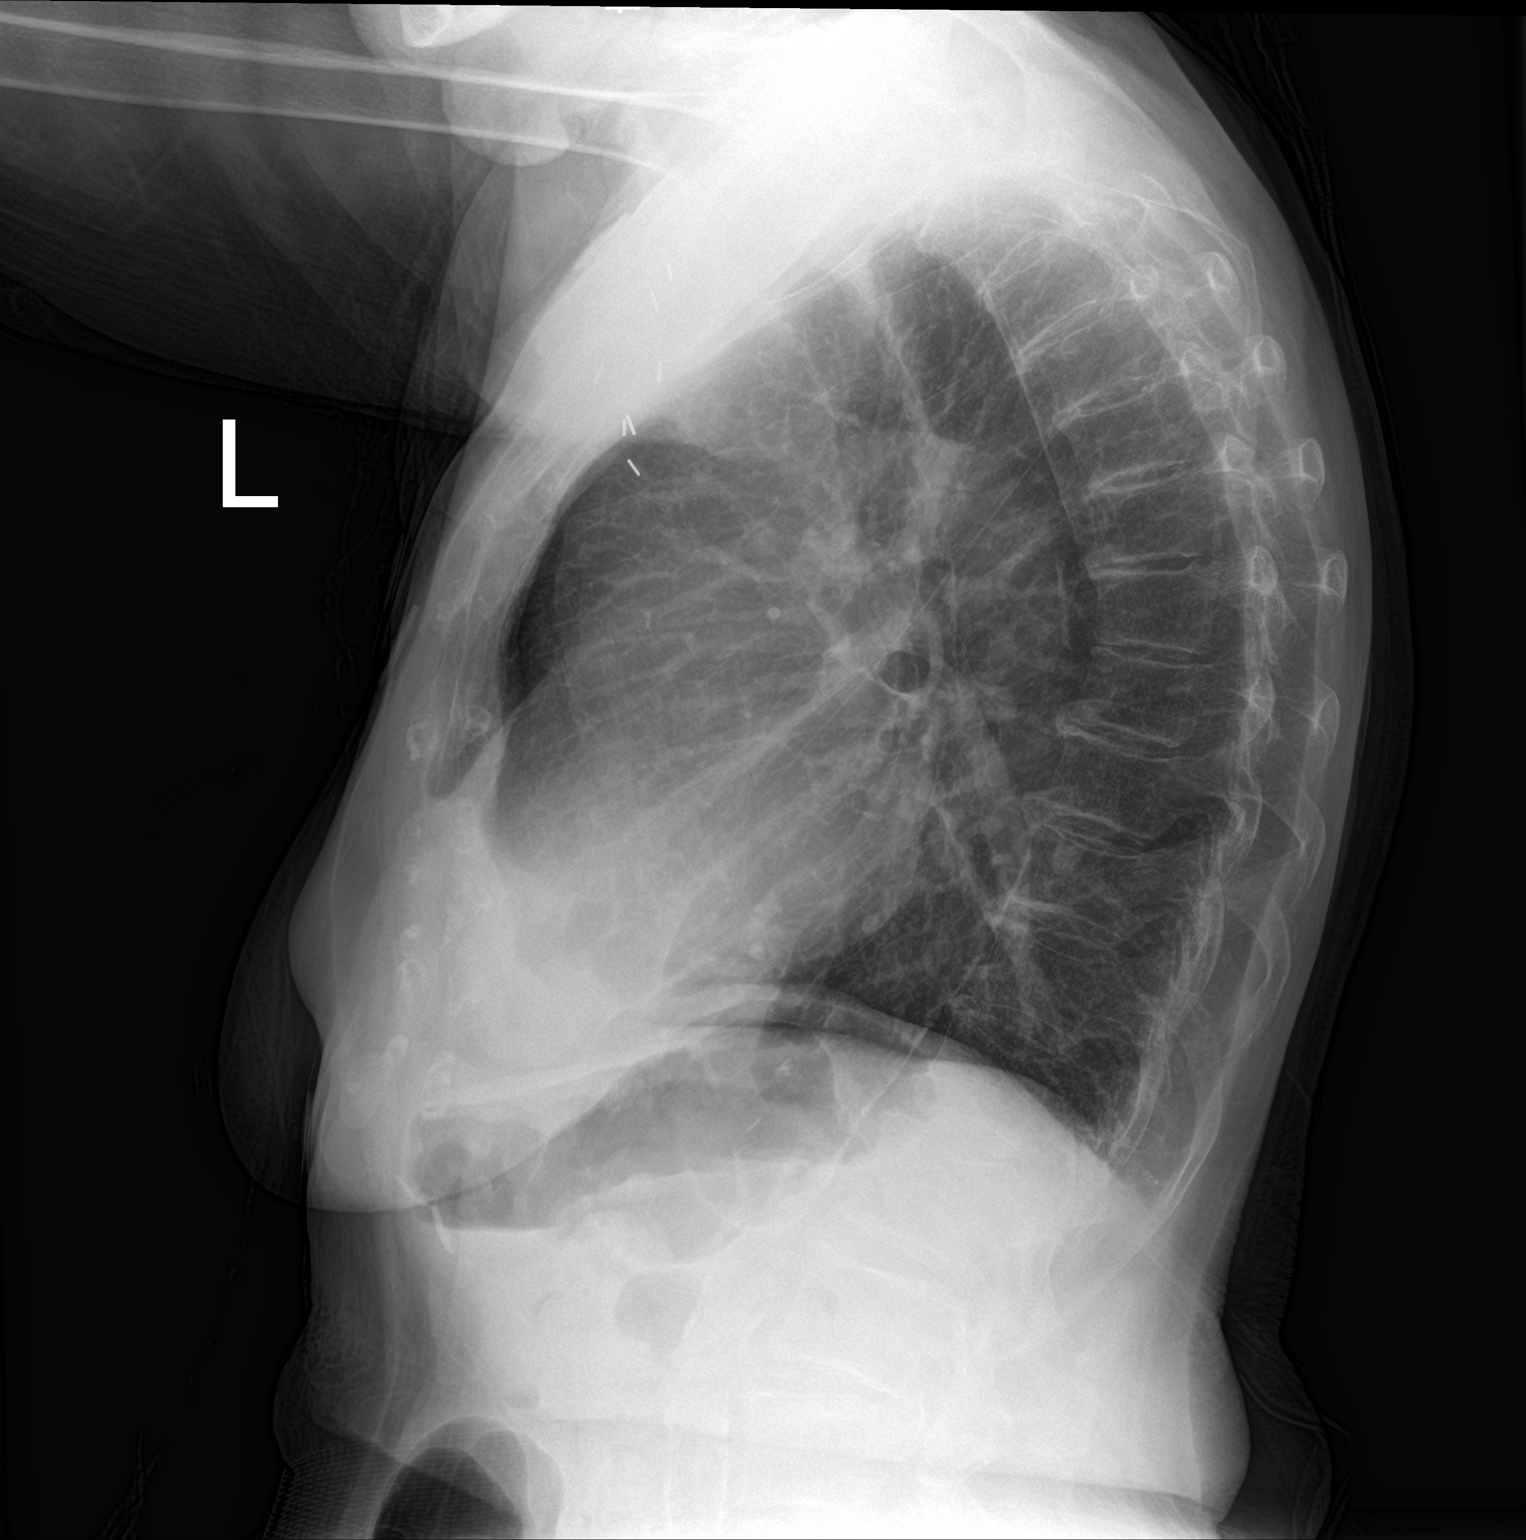

[chest pa]
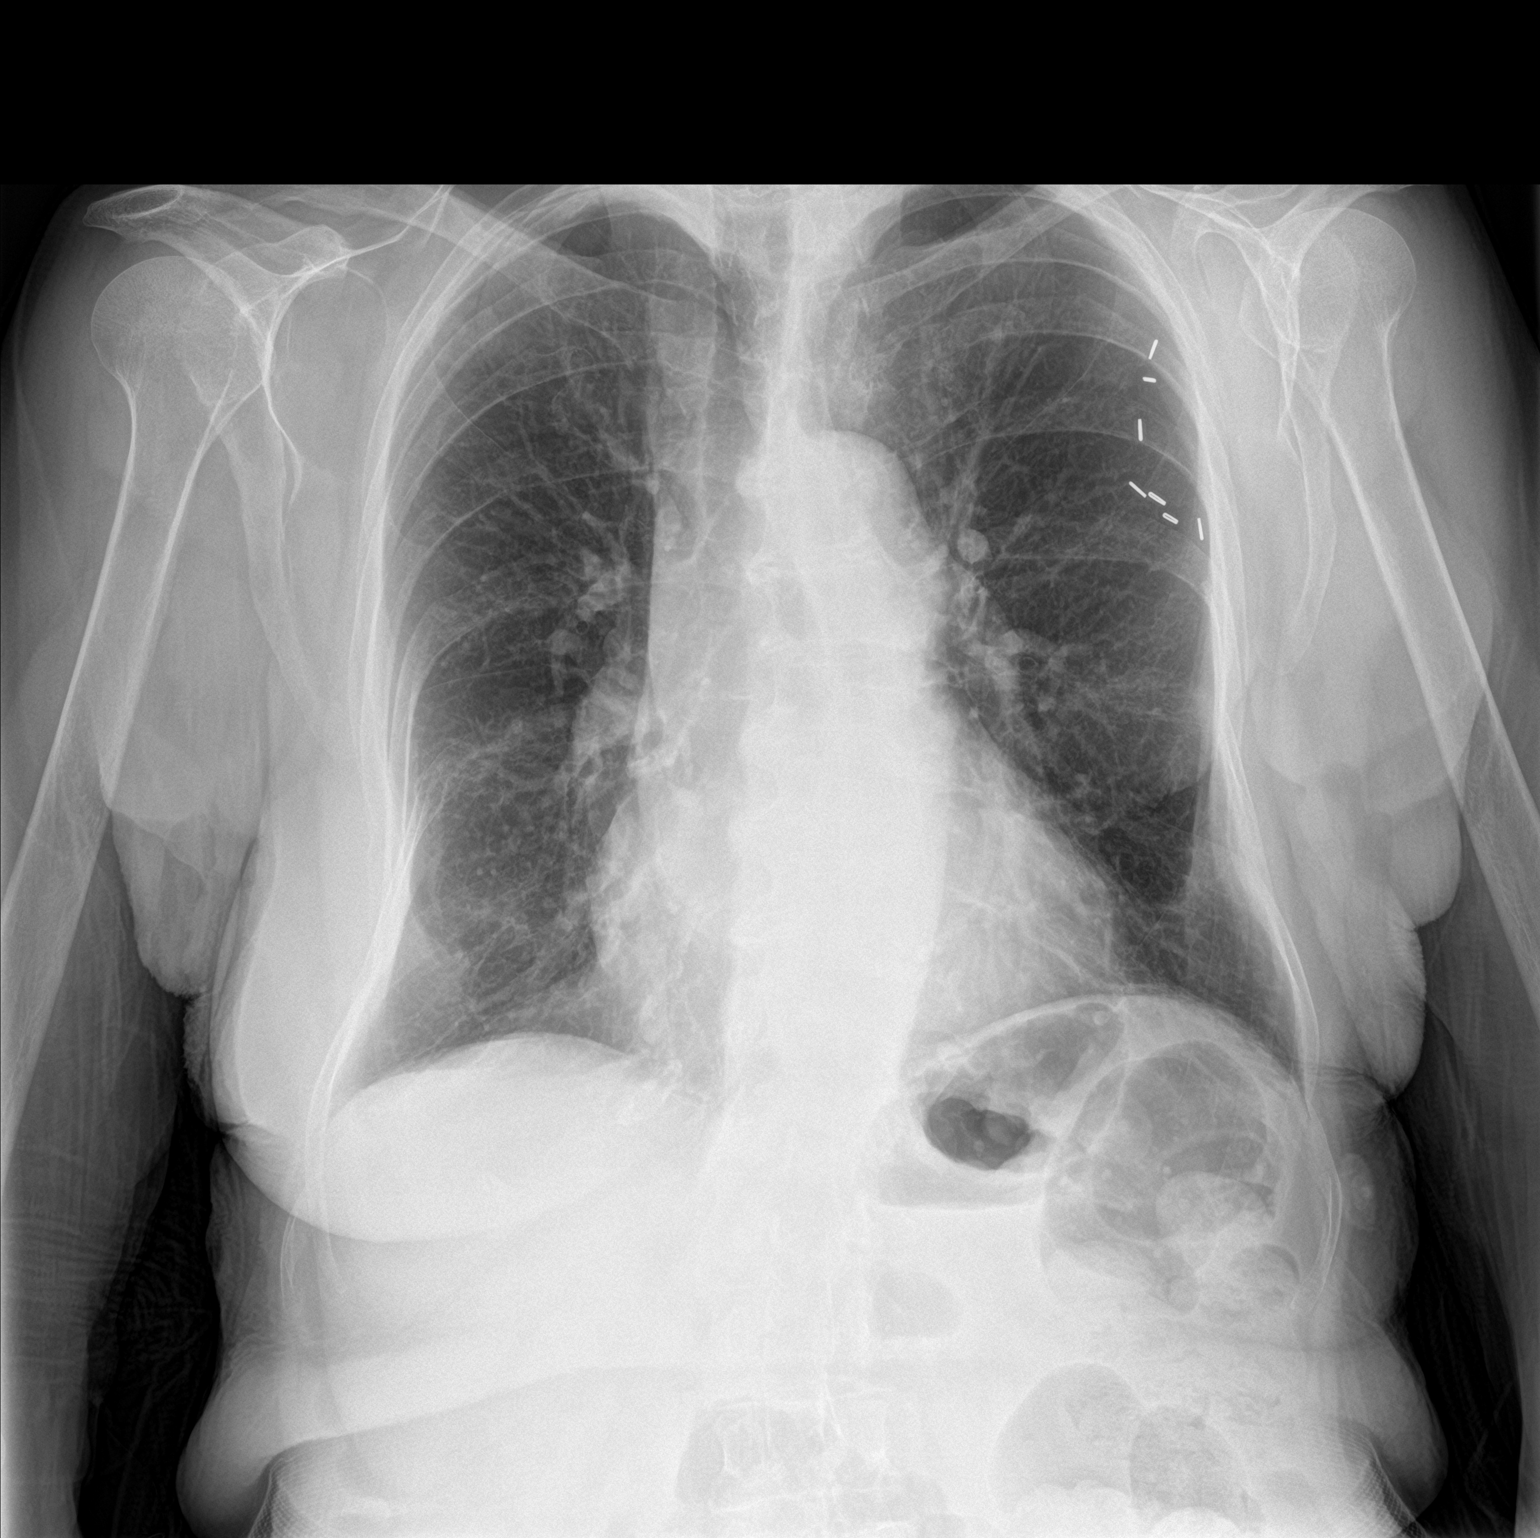

[2 of 2 positions shown; findings below may reference images not displayed]

FINDINGS: Borderline cardiomegaly. Pulmonary vascularity is normal. No
infiltrates or effusions. Aortic atherosclerosis.

The patient appears to have had a previous left mastectomy. No acute
bone abnormality.
IMPRESSION: Borderline cardiomegaly.  No acute findings.

Aortic atherosclerosis.

## 2018-05-26 ENCOUNTER — Telehealth: Payer: Self-pay

## 2018-05-26 ENCOUNTER — Telehealth: Payer: Self-pay | Admitting: Internal Medicine

## 2018-05-26 NOTE — Telephone Encounter (Signed)

## 2018-05-26 NOTE — Telephone Encounter (Signed)
PHONE VISIT ONLY no video, pre-reg complete, no mychart, verbal consent given 05/26/2018 MS

## 2018-05-28 ENCOUNTER — Telehealth (INDEPENDENT_AMBULATORY_CARE_PROVIDER_SITE_OTHER): Payer: Medicare Other | Admitting: Internal Medicine

## 2018-05-28 VITALS — BP 132/55 | HR 64 | Ht 66.0 in | Wt 149.0 lb

## 2018-05-28 DIAGNOSIS — I10 Essential (primary) hypertension: Secondary | ICD-10-CM | POA: Diagnosis not present

## 2018-05-28 DIAGNOSIS — Z7901 Long term (current) use of anticoagulants: Secondary | ICD-10-CM

## 2018-05-28 DIAGNOSIS — N184 Chronic kidney disease, stage 4 (severe): Secondary | ICD-10-CM

## 2018-05-28 DIAGNOSIS — I480 Paroxysmal atrial fibrillation: Secondary | ICD-10-CM

## 2018-05-28 DIAGNOSIS — Z79899 Other long term (current) drug therapy: Secondary | ICD-10-CM

## 2018-05-28 NOTE — Progress Notes (Signed)
Virtual Visit via Telephone Note   This visit type was conducted due to national recommendations for restrictions regarding the COVID-19 Pandemic (e.g. social distancing) in an effort to limit this patient's exposure and mitigate transmission in our community.  Due to her co-morbid illnesses, this patient is at least at moderate risk for complications without adequate follow up.  This format is felt to be most appropriate for this patient at this time.  The patient did not have access to video technology/had technical difficulties with video requiring transitioning to audio format only (telephone).  All issues noted in this document were discussed and addressed.  No physical exam could be performed with this format.  Please refer to the patient's chart for her  consent to telehealth for [facility].   Evaluation Performed:  Telephone follow-up  Date:  05/28/2018   ID:  Allison Norman, DOB [birth year], MRN [id number]  Patient Location:  [place] [address]  Provider location:   [address] [place] [place], [place] [address]  PCP:  [name], MD  Cardiologist:  Pixie Casino, MD Electrophysiologist:  None   Chief Complaint:  No complaints  History of Present Illness:    Allison Norman is a 85 y.o. female who presents via audio/video conferencing for a telehealth visit today.  Allison Norman returns for follow-up.  She was seen today via telephone visit.  She has no signs or symptoms of heart failure or angina.  She denies any recurrent atrial fibrillation has been maintained on low-dose amiodarone.  She is anticoagulated on Eliquis and has no bleeding issues.  She has not had any recent lab work.  The patient does not have symptoms concerning for COVID-19 infection (fever, chills, cough, or new SHORTNESS OF BREATH).    Prior CV studies:   The following studies were reviewed today:  Chart review  PMHx:  Past Medical History:  Diagnosis Date   . Atrial fibrillation ([place]) 2017   a. s/p DCCV in 10/2015  b. recurrent in 08/2016 --> rate-control pursued.   . Cancer ([place])    Breast  . CKD (chronic kidney disease)   . GCA (giant cell arteritis) ([place])   . Hypertension   . Macular degeneration   . Osteoporosis   . PMR (polymyalgia rheumatica) (HCC)   . Skin cancer     Past Surgical History:  Procedure Laterality Date  . CARDIOVERSION N/A 10/18/2015   Procedure: CARDIOVERSION;  Surgeon: Jerline Pain, MD;  Location: [place];  Service: Cardiovascular;  Laterality: N/A;  . CARDIOVERSION N/A 02/20/2017   Procedure: CARDIOVERSION;  Surgeon: Pixie Casino, MD;  Location: [facility] ENDOSCOPY;  Service: Cardiovascular;  Laterality: N/A;  . CARDIOVERSION N/A 06/18/2017   Procedure: CARDIOVERSION;  Surgeon: Sanda Klein, MD;  Location: [place] ENDOSCOPY;  Service: Cardiovascular;  Laterality: N/A;  . KNEE SURGERY  2013  . MASTECTOMY  1998   left side    FAMHx:  Family History  Problem Relation Age of Onset  . Stroke Mother   . Kidney failure Father        died at 67  . Heart disease Sister        died at 54  . Heart disease Sister        died at 45  . Breast cancer Daughter     SOCHx:   reports that she quit smoking about 51 years ago. She has never used smokeless tobacco. She reports that she does not drink  alcohol or use drugs.  ALLERGIES:  Allergies  Allergen Reactions  . Codeine Itching  . Penicillins Itching, Rash and Other (See Comments)    Has patient had a PCN reaction causing immediate rash, facial/tongue/throat swelling, SOB or lightheadedness with hypotension: Yes Has patient had a PCN reaction causing severe rash involving mucus membranes or skin necrosis: No Has patient had a PCN reaction that required hospitalization: No Has patient had a PCN reaction occurring within the last 10 years: No If all of the above answers are "NO", then may proceed with Cephalosporin use.    MEDS:  Current Meds  Medication Sig   . amiodarone (PACERONE) 200 MG tablet Take 1 tablet (200 mg total) by mouth daily.  [name] amLODipine (NORVASC) 10 MG tablet Take 1 tablet (10 mg total) by mouth daily.  . Bevacizumab (AVASTIN IV) Eye injection every 6 weeks  . cloNIDine (CATAPRES) 0.2 MG tablet TAKE 1 TABLET BY MOUTH THREE TIMES DAILY  . diazepam (VALIUM) 2 MG tablet Take 2 mg by mouth at bedtime as needed (for sleep).   [name] ELIQUIS 2.5 MG TABS tablet Take 1 tablet by mouth twice daily  . furosemide (LASIX) 40 MG tablet Take 40 mg by mouth daily.  [name] losartan (COZAAR) 100 MG tablet Take 100 mg by mouth daily. Per PCP   . metoprolol succinate (TOPROL-XL) 25 MG 24 hr tablet Take 0.5 tablets (12.5 mg total) by mouth daily.  . Multiple Vitamins-Minerals (PRESERVISION AREDS 2) CAPS Take 1 capsule by mouth daily.  . predniSONE (DELTASONE) 5 MG tablet Take 5 mg by mouth daily.  [name] pyridoxine (B-6) 100 MG tablet Take 100 mg by mouth daily.     ROS: Pertinent items noted in HPI and remainder of comprehensive ROS otherwise negative.  Labs/Other Tests and Data Reviewed:    Recent Labs: 06/10/2017: BUN 24; Creatinine, Ser 1.71; Hemoglobin 13.3; Platelets 271; Potassium 4.4; Sodium 135   Recent Lipid Panel Lab Results  Component Value Date/Time   CHOL  07/04/2009 04:18 AM    81        ATP III CLASSIFICATION:  <200     mg/dL   Desirable  200-239  mg/dL   Borderline High  >=240    mg/dL   High          TRIG 64 07/04/2009 04:18 AM   HDL 18 (L) 07/04/2009 04:18 AM   CHOLHDL 4.5 07/04/2009 04:18 AM   LDLCALC  07/04/2009 04:18 AM    50        Total Cholesterol/HDL:CHD Risk Coronary Heart Disease Risk Table                     Men   Women  1/2 Average Risk   3.4   3.3  Average Risk       5.0   4.4  2 X Average Risk   9.6   7.1  3 X Average Risk  23.4   11.0        Use the calculated Patient Ratio above and the CHD Risk Table to determine the patient's CHD Risk.        ATP III CLASSIFICATION (LDL):  <100     mg/dL   Optimal   100-129  mg/dL   Near or Above                    Optimal  130-159  mg/dL   Borderline  160-189  mg/dL   High  >  190     mg/dL   Very High    Wt Readings from Last 3 Encounters:  05/28/18 149 lb (67.6 kg)  12/01/17 153 lb 6.4 oz (69.6 kg)  10/08/17 152 lb 14.4 oz (69.4 kg)     Exam:    Vital Signs:  BP (!) 132/55   Pulse 64   Ht 5\' 6"  (1.676 m)   Wt 149 lb (67.6 kg)   SpO2 95%   BMI 24.05 kg/m    No exam due to telephone visit  ASSESSMENT & PLAN:    1. New onset A. fib-CHADSVASC score of 4 - s/p successful DCCV (10/2015) - on Eliquis 2. Hypertension 3. CKD4  Mrs. Kendrick continues to do well without any new or recurrent symptoms related to her A. fib.  She is on Eliquis and was last cardioverted in 2017.  She is maintained on low-dose amiodarone.  She is due for repeat lab work including metabolic profile, liver enzymes and evaluation of TSH on amiodarone which we will order.  Her blood pressure was controlled today.  No changes to her medications.  COVID-19 Education: The signs and symptoms of COVID-19 were discussed with the patient and how to seek care for testing (follow up with PCP or arrange E-visit).  The importance of social distancing was discussed today.  Patient Risk:   After full review of this patients clinical status, I feel that they are at least moderate risk at this time.  Time:   Today, I have spent 25 minutes with the patient with telehealth technology discussing hypertension, atrial fibrillation, and CKD 4.   Medication Adjustments/Labs and Tests Ordered: Current medicines are reviewed at length with the patient today.  Concerns regarding medicines are outlined above.   Tests Ordered: Orders Placed This Encounter  Procedures  . CBC  . Comprehensive metabolic panel  . TSH    Medication Changes: No orders of the defined types were placed in this encounter.   Disposition:  in 6 month(s)  Pixie Casino, MD, [facility], [place] Director of the Advanced Lipid Disorders &  Cardiovascular Risk Reduction Clinic Diplomate of the American Board of Clinical Lipidology Attending Cardiologist  Direct Dial: [phone]  Fax: [phone]  Website:  [email or web address]  Pixie Casino, MD  05/28/2018 1:32 PM

## 2018-05-28 NOTE — Patient Instructions (Signed)
Medication Instructions:  Your physician recommends that you continue on your current medications as directed. Please refer to the Current Medication list given to you today.  If you need a refill on your cardiac medications before your next appointment, please call your pharmacy.   Lab work: CBC, CMET, TSH - non-fasting lab work If you have labs (blood work) drawn today and your tests are completely normal, you will receive your results only by: [name] MyChart Message (if you have MyChart) OR . A paper copy in the mail If you have any lab test that is abnormal or we need to change your treatment, we will call you to review the results.  Testing/Procedures: NONE  Follow-Up: At [facility], you and your health needs are our priority.  As part of our continuing mission to provide you with exceptional heart care, we have created designated Provider Care Teams.  These Care Teams include your primary Cardiologist (physician) and Advanced Practice Providers (APPs -  Physician Assistants and Nurse Practitioners) who all work together to provide you with the care you need, when you need it. You will need a follow up appointment in 6 months.  Please call our office 2 months in advance to schedule this appointment.  You may see Pixie Casino, MD or one of the following Advanced Practice Providers on your designated Care Team: [place], [place] . Fabian Sharp, PA-C

## 2018-06-04 DIAGNOSIS — Z7901 Long term (current) use of anticoagulants: Secondary | ICD-10-CM | POA: Diagnosis not present

## 2018-06-04 DIAGNOSIS — Z79899 Other long term (current) drug therapy: Secondary | ICD-10-CM | POA: Diagnosis not present

## 2018-06-05 LAB — CBC
Hematocrit: 41.3 % (ref 34.0–46.6)
Hemoglobin: 13.2 g/dL (ref 11.1–15.9)
MCH: 30.6 pg (ref 26.6–33.0)
MCHC: 32 g/dL (ref 31.5–35.7)
MCV: 96 fL (ref 79–97)
Platelets: 229 10*3/uL (ref 150–450)
RBC: 4.32 x10E6/uL (ref 3.77–5.28)
RDW: 14 % (ref 11.7–15.4)
WBC: 6.6 10*3/uL (ref 3.4–10.8)

## 2018-06-05 LAB — COMPREHENSIVE METABOLIC PANEL
ALT: 29 IU/L (ref 0–32)
AST: 34 IU/L (ref 0–40)
Albumin/Globulin Ratio: 1.8 (ref 1.2–2.2)
Albumin: 4.4 g/dL (ref 3.5–4.6)
Alkaline Phosphatase: 152 IU/L — ABNORMAL HIGH (ref 39–117)
BUN/Creatinine Ratio: 15 (ref 12–28)
BUN: 29 mg/dL (ref 10–36)
Bilirubin Total: 0.6 mg/dL (ref 0.0–1.2)
CO2: 21 mmol/L (ref 20–29)
Calcium: 9.3 mg/dL (ref 8.7–10.3)
Chloride: 102 mmol/L (ref 96–106)
Creatinine, Ser: 1.91 mg/dL — ABNORMAL HIGH (ref 0.57–1.00)
GFR calc Af Amer: 26 mL/min/{1.73_m2} — ABNORMAL LOW (ref >59)
GFR calc non Af Amer: 23 mL/min/{1.73_m2} — ABNORMAL LOW (ref >59)
Globulin, Total: 2.5 g/dL (ref 1.5–4.5)
Glucose: 84 mg/dL (ref 65–99)
Potassium: 4.8 mmol/L (ref 3.5–5.2)
Sodium: 141 mmol/L (ref 134–144)
Total Protein: 6.9 g/dL (ref 6.0–8.5)

## 2018-06-05 LAB — TSH: TSH: 2.36 u[IU]/mL (ref 0.450–4.500)

## 2018-06-08 ENCOUNTER — Telehealth: Payer: Self-pay | Admitting: Internal Medicine

## 2018-06-08 DIAGNOSIS — H353211 Exudative age-related macular degeneration, right eye, with active choroidal neovascularization: Secondary | ICD-10-CM | POA: Diagnosis not present

## 2018-06-08 DIAGNOSIS — H35371 Puckering of macula, right eye: Secondary | ICD-10-CM | POA: Diagnosis not present

## 2018-06-08 DIAGNOSIS — H35051 Retinal neovascularization, unspecified, right eye: Secondary | ICD-10-CM | POA: Diagnosis not present

## 2018-06-08 NOTE — Telephone Encounter (Signed)
Patient is returning nurses call.

## 2018-06-08 NOTE — Telephone Encounter (Signed)
Called patient back- lab work given.  Patient had no questions or concerns.  She states she is drinking more water, and is off her lasix as suggested by MD.  Thanks!

## 2018-06-17 DIAGNOSIS — N183 Chronic kidney disease, stage 3 (moderate): Secondary | ICD-10-CM | POA: Diagnosis not present

## 2018-06-17 DIAGNOSIS — Z853 Personal history of malignant neoplasm of breast: Secondary | ICD-10-CM | POA: Diagnosis not present

## 2018-06-17 DIAGNOSIS — D649 Anemia, unspecified: Secondary | ICD-10-CM | POA: Diagnosis not present

## 2018-06-17 DIAGNOSIS — E781 Pure hyperglyceridemia: Secondary | ICD-10-CM | POA: Diagnosis not present

## 2018-06-17 DIAGNOSIS — I10 Essential (primary) hypertension: Secondary | ICD-10-CM | POA: Diagnosis not present

## 2018-06-17 DIAGNOSIS — M179 Osteoarthritis of knee, unspecified: Secondary | ICD-10-CM | POA: Diagnosis not present

## 2018-06-17 DIAGNOSIS — N184 Chronic kidney disease, stage 4 (severe): Secondary | ICD-10-CM | POA: Diagnosis not present

## 2018-06-17 DIAGNOSIS — I4891 Unspecified atrial fibrillation: Secondary | ICD-10-CM | POA: Diagnosis not present

## 2018-07-14 DIAGNOSIS — N184 Chronic kidney disease, stage 4 (severe): Secondary | ICD-10-CM | POA: Diagnosis not present

## 2018-07-15 ENCOUNTER — Other Ambulatory Visit: Payer: Self-pay | Admitting: Internal Medicine

## 2018-07-15 DIAGNOSIS — H353211 Exudative age-related macular degeneration, right eye, with active choroidal neovascularization: Secondary | ICD-10-CM | POA: Diagnosis not present

## 2018-07-15 DIAGNOSIS — H353222 Exudative age-related macular degeneration, left eye, with inactive choroidal neovascularization: Secondary | ICD-10-CM | POA: Diagnosis not present

## 2018-07-19 ENCOUNTER — Other Ambulatory Visit (HOSPITAL_COMMUNITY): Payer: Self-pay | Admitting: Nurse Practitioner

## 2018-08-02 ENCOUNTER — Other Ambulatory Visit: Payer: Self-pay | Admitting: *Deleted

## 2018-08-04 DIAGNOSIS — N184 Chronic kidney disease, stage 4 (severe): Secondary | ICD-10-CM | POA: Diagnosis not present

## 2018-08-04 DIAGNOSIS — G4700 Insomnia, unspecified: Secondary | ICD-10-CM | POA: Diagnosis not present

## 2018-08-04 MED ORDER — AMIODARONE HCL 200 MG PO TABS: 200.0000 mg | ORAL_TABLET | Freq: Every day | ORAL | 2 refills | Status: DC

## 2018-08-17 DIAGNOSIS — H353211 Exudative age-related macular degeneration, right eye, with active choroidal neovascularization: Secondary | ICD-10-CM | POA: Diagnosis not present

## 2018-08-17 DIAGNOSIS — H353122 Nonexudative age-related macular degeneration, left eye, intermediate dry stage: Secondary | ICD-10-CM | POA: Diagnosis not present

## 2018-08-17 DIAGNOSIS — H353112 Nonexudative age-related macular degeneration, right eye, intermediate dry stage: Secondary | ICD-10-CM | POA: Diagnosis not present

## 2018-08-17 DIAGNOSIS — H353222 Exudative age-related macular degeneration, left eye, with inactive choroidal neovascularization: Secondary | ICD-10-CM | POA: Diagnosis not present

## 2018-08-20 ENCOUNTER — Other Ambulatory Visit: Payer: Self-pay | Admitting: Internal Medicine

## 2018-08-20 MED ORDER — AMLODIPINE BESYLATE 10 MG PO TABS: 10.0000 mg | ORAL_TABLET | Freq: Every day | ORAL | 2 refills | Status: DC

## 2018-08-20 NOTE — Telephone Encounter (Signed)
New message     *STAT* If patient is at the pharmacy, call can be transferred to refill team.   1. Which medications need to be refilled? (please list name of each medication and dose if known) amLODipine (NORVASC) 10 MG tablet  2. Which pharmacy/location (including street and city if local pharmacy) is medication to be sent to?[place], [place]  3. Do they need a 30 day or 90 day supply?[place]

## 2018-08-20 NOTE — Telephone Encounter (Signed)
LVM letting patient know that I refilled her Amlodipine.

## 2018-09-06 DIAGNOSIS — Z85828 Personal history of other malignant neoplasm of skin: Secondary | ICD-10-CM | POA: Diagnosis not present

## 2018-09-06 DIAGNOSIS — D485 Neoplasm of uncertain behavior of skin: Secondary | ICD-10-CM | POA: Diagnosis not present

## 2018-09-06 DIAGNOSIS — C44629 Squamous cell carcinoma of skin of left upper limb, including shoulder: Secondary | ICD-10-CM | POA: Diagnosis not present

## 2018-09-06 DIAGNOSIS — C44719 Basal cell carcinoma of skin of left lower limb, including hip: Secondary | ICD-10-CM | POA: Diagnosis not present

## 2018-09-06 DIAGNOSIS — L570 Actinic keratosis: Secondary | ICD-10-CM | POA: Diagnosis not present

## 2018-09-21 DIAGNOSIS — H35371 Puckering of macula, right eye: Secondary | ICD-10-CM | POA: Diagnosis not present

## 2018-09-21 DIAGNOSIS — H353112 Nonexudative age-related macular degeneration, right eye, intermediate dry stage: Secondary | ICD-10-CM | POA: Diagnosis not present

## 2018-09-21 DIAGNOSIS — H35051 Retinal neovascularization, unspecified, right eye: Secondary | ICD-10-CM | POA: Diagnosis not present

## 2018-09-21 DIAGNOSIS — H353211 Exudative age-related macular degeneration, right eye, with active choroidal neovascularization: Secondary | ICD-10-CM | POA: Diagnosis not present

## 2018-10-07 ENCOUNTER — Other Ambulatory Visit: Payer: Self-pay | Admitting: Family Medicine

## 2018-10-07 DIAGNOSIS — Z1231 Encounter for screening mammogram for malignant neoplasm of breast: Secondary | ICD-10-CM

## 2018-10-14 DIAGNOSIS — R609 Edema, unspecified: Secondary | ICD-10-CM | POA: Diagnosis not present

## 2018-10-14 DIAGNOSIS — Z23 Encounter for immunization: Secondary | ICD-10-CM | POA: Diagnosis not present

## 2018-10-14 DIAGNOSIS — R109 Unspecified abdominal pain: Secondary | ICD-10-CM | POA: Diagnosis not present

## 2018-10-14 DIAGNOSIS — M79673 Pain in unspecified foot: Secondary | ICD-10-CM | POA: Diagnosis not present

## 2018-10-26 DIAGNOSIS — H353112 Nonexudative age-related macular degeneration, right eye, intermediate dry stage: Secondary | ICD-10-CM | POA: Diagnosis not present

## 2018-10-26 DIAGNOSIS — H353211 Exudative age-related macular degeneration, right eye, with active choroidal neovascularization: Secondary | ICD-10-CM | POA: Diagnosis not present

## 2018-10-26 DIAGNOSIS — H35371 Puckering of macula, right eye: Secondary | ICD-10-CM | POA: Diagnosis not present

## 2018-10-28 ENCOUNTER — Other Ambulatory Visit: Payer: Self-pay | Admitting: Internal Medicine

## 2018-10-28 DIAGNOSIS — I4891 Unspecified atrial fibrillation: Secondary | ICD-10-CM

## 2018-10-28 NOTE — Telephone Encounter (Signed)
Refill request for Eliquis

## 2018-11-22 ENCOUNTER — Other Ambulatory Visit: Payer: Self-pay

## 2018-11-22 ENCOUNTER — Ambulatory Visit
Admission: RE | Admit: 2018-11-22 | Discharge: 2018-11-22 | Disposition: A | Discharge status: Home / Self Care | Payer: Medicare Other | Source: Ambulatory Visit | Attending: Family Medicine | Admitting: Family Medicine

## 2018-11-22 DIAGNOSIS — Z1231 Encounter for screening mammogram for malignant neoplasm of breast: Secondary | ICD-10-CM

## 2018-11-24 ENCOUNTER — Ambulatory Visit: Payer: Medicare Other | Admitting: Internal Medicine

## 2018-11-29 ENCOUNTER — Encounter: Payer: Self-pay | Admitting: Cardiology

## 2018-11-29 ENCOUNTER — Ambulatory Visit (INDEPENDENT_AMBULATORY_CARE_PROVIDER_SITE_OTHER): Payer: Medicare Other | Admitting: Cardiology

## 2018-11-29 ENCOUNTER — Other Ambulatory Visit: Payer: Self-pay

## 2018-11-29 VITALS — BP 160/83 | HR 77 | Temp 97.2°F | Ht 66.0 in | Wt 148.8 lb

## 2018-11-29 DIAGNOSIS — N184 Chronic kidney disease, stage 4 (severe): Secondary | ICD-10-CM | POA: Diagnosis not present

## 2018-11-29 DIAGNOSIS — I4891 Unspecified atrial fibrillation: Secondary | ICD-10-CM

## 2018-11-29 DIAGNOSIS — Z7901 Long term (current) use of anticoagulants: Secondary | ICD-10-CM | POA: Diagnosis not present

## 2018-11-29 DIAGNOSIS — I480 Paroxysmal atrial fibrillation: Secondary | ICD-10-CM

## 2018-11-29 DIAGNOSIS — I10 Essential (primary) hypertension: Secondary | ICD-10-CM | POA: Diagnosis not present

## 2018-11-29 MED ORDER — METOPROLOL SUCCINATE ER 25 MG PO TB24: 25.0000 mg | ORAL_TABLET | Freq: Every day | ORAL | 2 refills | Status: DC

## 2018-11-29 NOTE — Assessment & Plan Note (Signed)
Low dose Eliquis 

## 2018-11-29 NOTE — Assessment & Plan Note (Signed)
Repeat B/P by me 140/80

## 2018-11-29 NOTE — Patient Instructions (Addendum)
Medication Instructions:  INCREASE TOPROL to 25mg  Take 1 tablet once a day  *If you need a refill on your cardiac medications before your next appointment, please call your pharmacy*  Lab Work: None  If you have labs (blood work) drawn today and your tests are completely normal, you will receive your results only by: [name] MyChart Message (if you have MyChart) OR . A paper copy in the mail If you have any lab test that is abnormal or we need to change your treatment, we will call you to review the results.  Testing/Procedures: None   Follow-Up: At [facility], you and your health needs are our priority.  As part of our continuing mission to provide you with exceptional heart care, we have created designated Provider Care Teams.  These Care Teams include your primary Cardiologist (physician) and Advanced Practice Providers (APPs -  Physician Assistants and Nurse Practitioners) who all work together to provide you with the care you need, when you need it.  Your next appointment:    TO BE DETERMINED ONE [place] SPEAKS WITH DR HILTY  The format for your next appointment:     Provider:     Other Instructions

## 2018-11-29 NOTE — Assessment & Plan Note (Signed)
Her last DCCV was in June 2019-after Amiodarone loading

## 2018-11-29 NOTE — Assessment & Plan Note (Signed)
GFR 23 

## 2018-11-29 NOTE — Progress Notes (Signed)
Cardiology Office Note:    Date:  11/29/2018   ID:  Allison Norman, DOB [birth year], MRN [id number]  PCP:  [name], MD  Cardiologist:  Pixie Casino, MD  Electrophysiologist:  None   Referring MD: [name], MD   CC:  Tachycardia  History of Present Illness:    Allison Norman is a 85 y.o. female with a hx of PAF.  She has had prior cardioversions, last one in June 2019 after being loaded with Amiodarone. She was supposed to see Dr Debara Pickett last week after she noted her HR was > 100 on her home B/P machine.  She was unaware of being in atrial fibrillation but assumes she is in it when her HR is that fast.  In the office today she had two EKGs done- her HR is irregular and fast- 110.  One EKG was read as NSR/ST, the other as atrial fibrillation which I suspect is correct.    Past Medical History:  Diagnosis Date  . Atrial fibrillation ([place]) 2017   a. s/p DCCV in 10/2015  b. recurrent in 08/2016 --> rate-control pursued.   . Cancer ([place])    Breast  . CKD (chronic kidney disease)   . GCA (giant cell arteritis) ([place])   . Hypertension   . Macular degeneration   . Osteoporosis   . PMR (polymyalgia rheumatica) (HCC)   . Skin cancer     Past Surgical History:  Procedure Laterality Date  . CARDIOVERSION N/A 10/18/2015   Procedure: CARDIOVERSION;  Surgeon: Jerline Pain, MD;  Location: [place];  Service: Cardiovascular;  Laterality: N/A;  . CARDIOVERSION N/A 02/20/2017   Procedure: CARDIOVERSION;  Surgeon: Pixie Casino, MD;  [facility] ENDOSCOPY;  Service: Cardiovascular;  Laterality: N/A;  . CARDIOVERSION N/A 06/18/2017   Procedure: CARDIOVERSION;  Surgeon: Sanda Klein, MD;  Location: [place] ENDOSCOPY;  Service: Cardiovascular;  Laterality: N/A;  . KNEE SURGERY  2013  . MASTECTOMY  1998   left side    Current Medications: Current Meds  Medication Sig  . amiodarone (PACERONE) 200 MG tablet Take 1 tablet (200 mg total) by mouth daily.  [name] amLODipine  (NORVASC) 10 MG tablet Take 1 tablet (10 mg total) by mouth daily.  . Bevacizumab (AVASTIN IV) Eye injection every 6 weeks  . cloNIDine (CATAPRES) 0.2 MG tablet TAKE 1 TABLET BY MOUTH THREE TIMES DAILY  . diazepam (VALIUM) 2 MG tablet Take 2 mg by mouth at bedtime as needed (for sleep).   [name] diltiazem (CARDIZEM) 30 MG tablet Take 30 mg by mouth 2 (two) times daily as needed (as needed for heart rate greater than 100).  [name] ELIQUIS 2.5 MG TABS tablet Take 1 tablet by mouth twice daily  . losartan (COZAAR) 100 MG tablet Take 100 mg by mouth daily. Per PCP   . metoprolol succinate (TOPROL-XL) 25 MG 24 hr tablet Take 1 tablet (25 mg total) by mouth daily.  . Multiple Vitamins-Minerals (PRESERVISION AREDS 2) CAPS Take 1 capsule by mouth daily.  . predniSONE (DELTASONE) 5 MG tablet Take 5 mg by mouth as needed.   . pyridoxine (B-6) 100 MG tablet Take 100 mg by mouth daily.  . [DISCONTINUED] amiodarone (PACERONE) 200 MG tablet Take 200 mg by mouth daily.  . [DISCONTINUED] ELIQUIS 2.5 MG TABS tablet Take 2.5 mg by mouth 2 (two) times daily.  . [DISCONTINUED] furosemide (LASIX) 40 MG tablet Take 40 mg by mouth daily.  . [DISCONTINUED] metoprolol succinate (TOPROL-XL) 25 MG  24 hr tablet Take 1/2 (one-half) tablet by mouth once daily     Allergies:   Codeine and Penicillins   Social History   Socioeconomic History  . Marital status: Married    Spouse name: Not on file  . Number of children: 2  . Years of education: Not on file  . Highest education level: Not on file  Occupational History  . Not on file  Social Needs  . Financial resource strain: Not on file  . Food insecurity    Worry: Not on file    Inability: Not on file  . Transportation needs    Medical: Not on file    Non-medical: Not on file  Tobacco Use  . Smoking status: Former Smoker    Quit date: 08/31/1966    Years since quitting: 52.2  . Smokeless tobacco: Never Used  Substance and Sexual Activity  . Alcohol use: No  . Drug  use: No  . Sexual activity: Not on file  Lifestyle  . Physical activity    Days per week: Not on file    Minutes per session: Not on file  . Stress: Not on file  Relationships  . Social Herbalist on phone: Not on file    Gets together: Not on file    Attends religious service: Not on file    Active member of club or organization: Not on file    Attends meetings of clubs or organizations: Not on file    Relationship status: Not on file  Other Topics Concern  . Not on file  Social History Narrative   epworth sleepiness scale = 8 (08/31/15)     Family History: The patient's family history includes Breast cancer in her daughter; Heart disease in her sister and sister; Kidney failure in her father; Stroke in her mother.  ROS:   Please see the history of present illness.  All other systems reviewed and are negative.  EKGs/Labs/Other Studies Reviewed:    EKG:  EKG is ordered today.  The ekg ordered today demonstrates AF with VR 110  Recent Labs: 06/04/2018: ALT 29; BUN 29; Creatinine, Ser 1.91; Hemoglobin 13.2; Platelets 229; Potassium 4.8; Sodium 141; TSH 2.360  Recent Lipid Panel    Component Value Date/Time   CHOL  07/04/2009 0418    81        ATP III CLASSIFICATION:  <200     mg/dL   Desirable  200-239  mg/dL   Borderline High  >=240    mg/dL   High          TRIG 64 07/04/2009 0418   HDL 18 (L) 07/04/2009 0418   CHOLHDL 4.5 07/04/2009 0418   VLDL 13 07/04/2009 0418   LDLCALC  07/04/2009 0418    50        Total Cholesterol/HDL:CHD Risk Coronary Heart Disease Risk Table                     Men   Women  1/2 Average Risk   3.4   3.3  Average Risk       5.0   4.4  2 X Average Risk   9.6   7.1  3 X Average Risk  23.4   11.0        Use the calculated Patient Ratio above and the CHD Risk Table to determine the patient's CHD Risk.        ATP III CLASSIFICATION (LDL):  <100  mg/dL   Optimal  100-129  mg/dL   Near or Above                    Optimal   130-159  mg/dL   Borderline  160-189  mg/dL   High  >190     mg/dL   Very High    Physical Exam:    VS:  BP (!) 160/83   Pulse 77   Temp (!) 97.2 F (36.2 C)   Ht 5\' 6"  (1.676 m)   Wt 148 lb 12.8 oz (67.5 kg)   SpO2 92%   BMI 24.02 kg/m     Wt Readings from Last 3 Encounters:  11/29/18 148 lb 12.8 oz (67.5 kg)  05/28/18 149 lb (67.6 kg)  12/01/17 153 lb 6.4 oz (69.6 kg)     GEN:  Well nourished, well developed in no acute distress HEENT: Normal NECK: No JVD; No carotid bruits LYMPHATICS: No lymphadenopathy CARDIAC: irregularly irregular, no murmurs, rubs, gallops RESPIRATORY:  Clear to auscultation without rales, wheezing or rhonchi  ABDOMEN: Soft, non-tender, non-distended MUSCULOSKELETAL:  No edema; No deformity  SKIN: Warm and dry NEUROLOGIC:  Alert and oriented x 3 PSYCHIATRIC:  Normal affect   ASSESSMENT:    CRI (chronic renal insufficiency), stage 4 (severe) (HCC) GFR 23  PAF (paroxysmal atrial fibrillation) (HCC) Her last DCCV was in June 2019-after Amiodarone loading  On anticoagulant therapy Low dose Eliquis  Essential hypertension Repeat B/P by me 140/80  PLAN:    I'll review with Dr Debara Pickett- I did increase her Toprol to 25 mg daily.     Medication Adjustments/Labs and Tests Ordered: Current medicines are reviewed at length with the patient today.  Concerns regarding medicines are outlined above.  Orders Placed This Encounter  Procedures  . EKG 12-Lead   Meds ordered this encounter  Medications  . metoprolol succinate (TOPROL-XL) 25 MG 24 hr tablet    Sig: Take 1 tablet (25 mg total) by mouth daily.    Dispense:  90 tablet    Refill:  2    Patient Instructions  Medication Instructions:  INCREASE TOPROL to 25mg  Take 1 tablet once a day  *If you need a refill on your cardiac medications before your next appointment, please call your pharmacy*  Lab Work: None  If you have labs (blood work) drawn today and your tests are completely  normal, you will receive your results only by: [name] MyChart Message (if you have MyChart) OR . A paper copy in the mail If you have any lab test that is abnormal or we need to change your treatment, we will call you to review the results.  Testing/Procedures: None   Follow-Up: At [facility], you and your health needs are our priority.  As part of our continuing mission to provide you with exceptional heart care, we have created designated Provider Care Teams.  These Care Teams include your primary Cardiologist (physician) and Advanced Practice Providers (APPs -  Physician Assistants and Nurse Practitioners) who all work together to provide you with the care you need, when you need it.  Your next appointment:    TO BE DETERMINED ONE [place] SPEAKS WITH DR HILTY  The format for your next appointment:     Provider:     Other Instructions     Signed, [name], PA-C  11/29/2018 12:09 PM    [place]

## 2018-11-30 DIAGNOSIS — H353211 Exudative age-related macular degeneration, right eye, with active choroidal neovascularization: Secondary | ICD-10-CM | POA: Diagnosis not present

## 2018-11-30 DIAGNOSIS — H35351 Cystoid macular degeneration, right eye: Secondary | ICD-10-CM | POA: Diagnosis not present

## 2018-11-30 DIAGNOSIS — H353222 Exudative age-related macular degeneration, left eye, with inactive choroidal neovascularization: Secondary | ICD-10-CM | POA: Diagnosis not present

## 2018-12-06 DIAGNOSIS — Z85828 Personal history of other malignant neoplasm of skin: Secondary | ICD-10-CM | POA: Diagnosis not present

## 2018-12-15 ENCOUNTER — Other Ambulatory Visit: Payer: Self-pay

## 2018-12-15 ENCOUNTER — Ambulatory Visit (HOSPITAL_COMMUNITY)
Admission: RE | Admit: 2018-12-15 | Discharge: 2018-12-15 | Disposition: A | Discharge status: Home / Self Care | Payer: Medicare Other | Source: Ambulatory Visit | Attending: Nurse Practitioner | Admitting: Nurse Practitioner

## 2018-12-15 ENCOUNTER — Encounter (HOSPITAL_COMMUNITY): Payer: Self-pay | Admitting: Nurse Practitioner

## 2018-12-15 VITALS — BP 150/70 | HR 106 | Ht 66.0 in | Wt 154.6 lb

## 2018-12-15 DIAGNOSIS — Z8249 Family history of ischemic heart disease and other diseases of the circulatory system: Secondary | ICD-10-CM | POA: Diagnosis not present

## 2018-12-15 DIAGNOSIS — D6869 Other thrombophilia: Secondary | ICD-10-CM

## 2018-12-15 DIAGNOSIS — Z853 Personal history of malignant neoplasm of breast: Secondary | ICD-10-CM | POA: Diagnosis not present

## 2018-12-15 DIAGNOSIS — Z87891 Personal history of nicotine dependence: Secondary | ICD-10-CM | POA: Diagnosis not present

## 2018-12-15 DIAGNOSIS — Z7901 Long term (current) use of anticoagulants: Secondary | ICD-10-CM | POA: Insufficient documentation

## 2018-12-15 DIAGNOSIS — I480 Paroxysmal atrial fibrillation: Secondary | ICD-10-CM

## 2018-12-15 DIAGNOSIS — I4892 Unspecified atrial flutter: Secondary | ICD-10-CM | POA: Insufficient documentation

## 2018-12-15 DIAGNOSIS — M810 Age-related osteoporosis without current pathological fracture: Secondary | ICD-10-CM | POA: Diagnosis not present

## 2018-12-15 DIAGNOSIS — Z880 Allergy status to penicillin: Secondary | ICD-10-CM | POA: Insufficient documentation

## 2018-12-15 DIAGNOSIS — Z803 Family history of malignant neoplasm of breast: Secondary | ICD-10-CM | POA: Diagnosis not present

## 2018-12-15 DIAGNOSIS — Z885 Allergy status to narcotic agent status: Secondary | ICD-10-CM | POA: Insufficient documentation

## 2018-12-15 DIAGNOSIS — I129 Hypertensive chronic kidney disease with stage 1 through stage 4 chronic kidney disease, or unspecified chronic kidney disease: Secondary | ICD-10-CM | POA: Diagnosis not present

## 2018-12-15 DIAGNOSIS — Z79899 Other long term (current) drug therapy: Secondary | ICD-10-CM | POA: Insufficient documentation

## 2018-12-15 DIAGNOSIS — I4819 Other persistent atrial fibrillation: Secondary | ICD-10-CM | POA: Diagnosis not present

## 2018-12-15 DIAGNOSIS — N189 Chronic kidney disease, unspecified: Secondary | ICD-10-CM | POA: Diagnosis not present

## 2018-12-15 DIAGNOSIS — Z7952 Long term (current) use of systemic steroids: Secondary | ICD-10-CM | POA: Insufficient documentation

## 2018-12-15 DIAGNOSIS — Z85828 Personal history of other malignant neoplasm of skin: Secondary | ICD-10-CM | POA: Insufficient documentation

## 2018-12-15 DIAGNOSIS — Z823 Family history of stroke: Secondary | ICD-10-CM | POA: Insufficient documentation

## 2018-12-15 DIAGNOSIS — Z9012 Acquired absence of left breast and nipple: Secondary | ICD-10-CM | POA: Insufficient documentation

## 2018-12-15 DIAGNOSIS — Z841 Family history of disorders of kidney and ureter: Secondary | ICD-10-CM | POA: Diagnosis not present

## 2018-12-15 LAB — COMPREHENSIVE METABOLIC PANEL
ALT: 29 U/L (ref 0–44)
AST: 33 U/L (ref 15–41)
Albumin: 3.8 g/dL (ref 3.5–5.0)
Alkaline Phosphatase: 119 U/L (ref 38–126)
Anion gap: 10 (ref 5–15)
BUN: 26 mg/dL — ABNORMAL HIGH (ref 8–23)
CO2: 22 mmol/L (ref 22–32)
Calcium: 9 mg/dL (ref 8.9–10.3)
Chloride: 107 mmol/L (ref 98–111)
Creatinine, Ser: 1.95 mg/dL — ABNORMAL HIGH (ref 0.44–1.00)
GFR calc Af Amer: 25 mL/min — ABNORMAL LOW (ref >60)
GFR calc non Af Amer: 22 mL/min — ABNORMAL LOW (ref >60)
Glucose, Bld: 116 mg/dL — ABNORMAL HIGH (ref 70–99)
Potassium: 4.9 mmol/L (ref 3.5–5.1)
Sodium: 139 mmol/L (ref 135–145)
Total Bilirubin: 0.8 mg/dL (ref 0.3–1.2)
Total Protein: 7.1 g/dL (ref 6.5–8.1)

## 2018-12-15 LAB — CBC
HCT: 42.5 % (ref 36.0–46.0)
Hemoglobin: 13.2 g/dL (ref 12.0–15.0)
MCH: 31.9 pg (ref 26.0–34.0)
MCHC: 31.1 g/dL (ref 30.0–36.0)
MCV: 102.7 fL — ABNORMAL HIGH (ref 80.0–100.0)
Platelets: 262 10*3/uL (ref 150–400)
RBC: 4.14 MIL/uL (ref 3.87–5.11)
RDW: 14.2 % (ref 11.5–15.5)
WBC: 7.2 10*3/uL (ref 4.0–10.5)
nRBC: 0 % (ref 0.0–0.2)

## 2018-12-15 LAB — TSH: TSH: 2.34 u[IU]/mL (ref 0.350–4.500)

## 2018-12-15 MED ORDER — FUROSEMIDE 20 MG PO TABS: 20.0000 mg | ORAL_TABLET | Freq: Every day | ORAL | 1 refills | Status: DC

## 2018-12-15 NOTE — Progress Notes (Addendum)
Primary Care Physician: [name], MD Referring Physician: [name] is a 85 y.o. female, appearing younger than stated age,with a h/o paroxysmal afib, dx fall of 2017, with successful cardioversion, but has had increase in  afib burden  since August. She was in [place] in November when seen by Dr. Debara Pickett. She is in the afib clinic to discuss options to treat.. Pt states that she has been under a lot of stress due to her 33 yo husband with progressive dementia and Hospice is now in the home. She feel that she has been in persistent afib since December.  She is busy during the day and does not notice her afib that much. She does notice it for the first several minutes trying to get to sleep. Does not notice any undo shortness of breath with exertion or fatigue. She is on apixaban correctly dosed at 2.5 mg bid for a chadsvasc score of 4. She noted increase in HR around 1/9 and was advised to go to the ER, waited 6 hours and had not been seen so left. She feels this elevated HR was due to stress over husband.  When seen in the afib clinic one week ago, we discussed options to manage her afib. She does not sound that symptomatic. BB was increased and she is for f/u. Her BP has been stable on higher dose of BB. HR's for the most part have her reasonably rate controlled. Offered cardioversion or amiodarone but she defers at this time.  F/u in afib clinic, she is now on cardizem 240 mg a day, dose increased last week when she felt her HR was still running too high. HR in office  now in the 70's. She has thought about options and would like to try a cardioversion. Last one held her almost a year. No missed does of eliquis.  F/u in afib clinic, she had successful cardioversion and continues in SR. She did have one short lived episode of  4:1 atrial flutter after DCCV but then went back into SR. She did not get her usual energy back right after cardioversion this time but is feeling some  better.   F/i in afib clinic, 4/10, unfortunately, pt has returned to afib for the last few weeks and has noted fluctuating HR/ BP readings. At home yesterday reported higher BP readings, in the office today at 156/84. It appears that the pt is getting more symptomatic with her afib and feels fatigued. I discussed with her starting amiodarone to pursue SR and she is in agreement.   F/u in fib clinic, 4/16 ,after starting on amiodarone 200 mg bid. She called the office yesterday with a HR in the upper 40's. BB dose was reduced to 1/2 tab daily. Today, she feels better and EKG shows SR with pac's in the 60's.  F/u in afib clinic, she feels improved but still has some slow heart rates but is not symptomatic. She is staying in [place]. Will reduce amiodarone to 200 mg once a day.  F/u in afib clinic, 05/25/17, she called in and was reporting some low heart rates so my nurse advised to cut BB in half.  Today, she is in aflutter with controlled v rate. Her husband died this week and from the stress may have caused her to get out of rhythm vrs, she also forgot her amiodarone for 4 days.  F/u in afib clinic, pt continues to be in afib despite increasing amiodarone to 200  mg bid. The recent death of her husband has caused a lot of stress which is probably contributing to persistence of afib Discussed cardioversion and pt is in agreement. No missed doses of eliquis.  F/u in afib clinic following cardioversion which was successful and she continues in Sinus brady in the 40's and will lower rate control. She was suppose to come in last week but had a mechanical fall  with a significant abrasion to her knee and is seeing her PCP for dressings.   F/u in afib clinic 12/15/18.She was recently seen by [name], PA, and was found to be back in Afib. She feels that she may  have been out for 2-3 weeks. She is also c/o LLE edema, Rt greater than Left for the last few weeks as well. Twisted the rt ankle last week. She has been  off lasix  for a while for issues with kidney function climbing but she feels her LLE needs some diuretic . She had cardioversion around a year agoand held til recently. She continues on amiodarone 200 mg qd.[name] increased her BB. Ekg shows afib with RVR at 106 bpm.CHA2DS2VASc score of at least 4, continues on eliquis 2.5 mg bid .  Today, she denies symptoms of palpitations, chest pain, shortness of breath, orthopnea, PND, lower extremity edema, dizziness, presyncope, syncope, or neurologic sequela.+ fatigue. The patient is tolerating medications without difficulties and is otherwise without complaint today.   Past Medical History:  Diagnosis Date  . Atrial fibrillation ([place]) 2017   a. s/p DCCV in 10/2015  b. recurrent in 08/2016 --> rate-control pursued.   . Cancer ([facility])    Breast  . CKD (chronic kidney disease)   . GCA (giant cell arteritis) ([place])   . Hypertension   . Macular degeneration   . Osteoporosis   . PMR (polymyalgia rheumatica) ([facility])   . Skin cancer    Past Surgical History:  Procedure Laterality Date  . CARDIOVERSION N/A 10/18/2015   Procedure: CARDIOVERSION;  Surgeon: Jerline Pain, MD;  Location: [facility];  Service: Cardiovascular;  Laterality: N/A;  . CARDIOVERSION N/A 02/20/2017   Procedure: CARDIOVERSION;  Surgeon: Pixie Casino, MD;  Location: [facility] ENDOSCOPY;  Service: Cardiovascular;  Laterality: N/A;  . CARDIOVERSION N/A 06/18/2017   Procedure: CARDIOVERSION;  Surgeon: Sanda Klein, MD;  Location: [facility] ENDOSCOPY;  Service: Cardiovascular;  Laterality: N/A;  . KNEE SURGERY  2013  . MASTECTOMY  1998   left side    Current Outpatient Medications  Medication Sig Dispense Refill  . amiodarone (PACERONE) 200 MG tablet Take 1 tablet (200 mg total) by mouth daily. 90 tablet 2  . amLODipine (NORVASC) 10 MG tablet Take 1 tablet (10 mg total) by mouth daily. 90 tablet 2  . Bevacizumab (AVASTIN IV) Eye injection every 6 weeks    . cloNIDine (CATAPRES) 0.2 MG tablet  TAKE 1 TABLET BY MOUTH THREE TIMES DAILY 270 tablet 3  . diazepam (VALIUM) 2 MG tablet Take 2 mg by mouth at bedtime as needed (for sleep).   0  . diltiazem (CARDIZEM) 30 MG tablet Take 30 mg by mouth 2 (two) times daily as needed (as needed for heart rate greater than 100).    [name] ELIQUIS 2.5 MG TABS tablet Take 1 tablet by mouth twice daily 180 tablet 1  . losartan (COZAAR) 100 MG tablet Take 100 mg by mouth daily. Per PCP     . magnesium oxide (MAG-OX) 400 (241.3 Mg) MG tablet Take 2 tablets by  mouth at bedtime as needed.    . metoprolol succinate (TOPROL-XL) 25 MG 24 hr tablet Take 1 tablet (25 mg total) by mouth daily. 90 tablet 2  . Multiple Vitamins-Minerals (PRESERVISION AREDS 2) CAPS Take 1 capsule by mouth daily.    . predniSONE (DELTASONE) 5 MG tablet Take 5 mg by mouth as needed.   0  . pyridoxine (B-6) 100 MG tablet Take 100 mg by mouth daily.    . furosemide (LASIX) 20 MG tablet Take 1 tablet (20 mg total) by mouth daily. 30 tablet 1   No current facility-administered medications for this encounter.     Allergies  Allergen Reactions  . Codeine Itching  . Penicillins Itching, Rash and Other (See Comments)    Has patient had a PCN reaction causing immediate rash, facial/tongue/throat swelling, SOB or lightheadedness with hypotension: Yes Has patient had a PCN reaction causing severe rash involving mucus membranes or skin necrosis: No Has patient had a PCN reaction that required hospitalization: No Has patient had a PCN reaction occurring within the last 10 years: No If all of the above answers are "NO", then may proceed with Cephalosporin use.    Social History   Socioeconomic History  . Marital status: Married    Spouse name: Not on file  . Number of children: 2  . Years of education: Not on file  . Highest education level: Not on file  Occupational History  . Not on file  Social Needs  . Financial resource strain: Not on file  . Food insecurity    Worry: Not on file     Inability: Not on file  . Transportation needs    Medical: Not on file    Non-medical: Not on file  Tobacco Use  . Smoking status: Former Smoker    Quit date: 08/31/1966    Years since quitting: 52.3  . Smokeless tobacco: Never Used  Substance and Sexual Activity  . Alcohol use: No  . Drug use: No  . Sexual activity: Not on file  Lifestyle  . Physical activity    Days per week: Not on file    Minutes per session: Not on file  . Stress: Not on file  Relationships  . Social Herbalist on phone: Not on file    Gets together: Not on file    Attends religious service: Not on file    Active member of club or organization: Not on file    Attends meetings of clubs or organizations: Not on file    Relationship status: Not on file  . Intimate partner violence    Fear of current or ex partner: Not on file    Emotionally abused: Not on file    Physically abused: Not on file    Forced sexual activity: Not on file  Other Topics Concern  . Not on file  Social History Narrative   epworth sleepiness scale = 8 (08/31/15)    Family History  Problem Relation Age of Onset  . Stroke Mother   . Kidney failure Father        died at 77  . Heart disease Sister        died at 107  . Heart disease Sister        died at 41  . Breast cancer Daughter     ROS- All systems are reviewed and negative except as per the HPI above  Physical Exam: Vitals:   12/15/18 1346  BP: [name])  150/70  Pulse: (!) 106  Weight: 70.1 kg  Height: 5\' 6"  (1.676 m)   Wt Readings from Last 3 Encounters:  12/15/18 70.1 kg  11/29/18 67.5 kg  05/28/18 67.6 kg    Labs: Lab Results  Component Value Date   NA 141 06/04/2018   K 4.8 06/04/2018   CL 102 06/04/2018   CO2 21 06/04/2018   GLUCOSE 84 06/04/2018   BUN 29 06/04/2018   CREATININE 1.91 (H) 06/04/2018   CALCIUM 9.3 06/04/2018   MG 2.2 08/18/2016   Lab Results  Component Value Date   INR 1.1 10/11/2015   Lab Results  Component Value  Date   CHOL  07/04/2009    81        ATP III CLASSIFICATION:  <200     mg/dL   Desirable  200-239  mg/dL   Borderline High  >=240    mg/dL   High          HDL 18 (L) 07/04/2009   LDLCALC  07/04/2009    50        Total Cholesterol/HDL:CHD Risk Coronary Heart Disease Risk Table                     Men   Women  1/2 Average Risk   3.4   3.3  Average Risk       5.0   4.4  2 X Average Risk   9.6   7.1  3 X Average Risk  23.4   11.0        Use the calculated Patient Ratio above and the CHD Risk Table to determine the patient's CHD Risk.        ATP III CLASSIFICATION (LDL):  <100     mg/dL   Optimal  100-129  mg/dL   Near or Above                    Optimal  130-159  mg/dL   Borderline  160-189  mg/dL   High  >190     mg/dL   Very High   TRIG 64 07/04/2009     GEN- The patient is well appearing, alert and oriented x 3 today.   Head- normocephalic, atraumatic Eyes-  Sclera clear, conjunctiva pink Ears- hearing intact Oropharynx- clear Neck- supple, no JVP Lymph- no cervical lymphadenopathy Lungs- Clear to ausculation bilaterally, normal work of breathing Heart- regular rate and rhythm, (SR with PAc's) no murmurs, rubs or gallops, PMI not laterally displaced GI- soft, NT, ND, + BS Extremities- no clubbing, cyanosis, hard edema  rt worse than left  MS- no significant deformity or atrophy Skin- no rash or lesion Psych- euthymic mood, full affect Neuro- strength and sensation are intact  EKG- Marked sinus brady at 43 bpm, pr int 280 ms, qrs int 86 ms, qtc 398 ms.    Assessment and Plan: 1. Persistent  afib/flutter Underwent  loading of amiodarone and successful cardioversion 06/18/17.   Now back in afib for 2-3 weeks  Will plan on repeat cardioversion Continue amiodarone 200 mg  qd Reduce  toprol from  25 mg daily(recently increased back  to 12.5 mg daily on am of cardioversion sh she is slow in SR   She will continue apixaban 2.5 mg bid for chadsvasc score of 4   States no know missed does x 3 weeks   2. HTN Stable  3. LLE  Feels the need for diuretic Had been on 40  mg  daily and was stopped June  2020 for increase of creatinine  Will restart 20 mg daily Cmet/tsh/cbc today   Will ask for istat am of cardioversion   Afib clinic one week after cardioversion   Butch Penny C. Lillyn Wieczorek, [facility] [address] [place], [place] [address] [phone]

## 2018-12-15 NOTE — Patient Instructions (Signed)
Cardioversion scheduled for Tuesday, December 15th  - Arrive at the [facility] and go to admitting at 12PM  -Do not eat or drink anything after midnight the night prior to your procedure.  - Take all your morning medication with a sip of water prior to arrival.  - You will not be able to drive home after your procedure.   Day of cardioversion go back to 1/2 tablet of metoprolol  Start Lasix 20mg  once a day

## 2018-12-15 NOTE — H&P (View-Only) (Signed)
Primary Care Physician: [name], MD Referring Physician: [name] is a 85 y.o. female, appearing younger than stated age,with a h/o paroxysmal afib, dx fall of 2017, with successful cardioversion, but has had increase in  afib burden  since August. She was in [place] in November when seen by Dr. Debara Pickett. She is in the afib clinic to discuss options to treat.. Pt states that she has been under a lot of stress due to her 56 yo husband with progressive dementia and Hospice is now in the home. She feel that she has been in persistent afib since December.  She is busy during the day and does not notice her afib that much. She does notice it for the first several minutes trying to get to sleep. Does not notice any undo shortness of breath with exertion or fatigue. She is on apixaban correctly dosed at 2.5 mg bid for a chadsvasc score of 4. She noted increase in HR around 1/9 and was advised to go to the ER, waited 6 hours and had not been seen so left. She feels this elevated HR was due to stress over husband.  When seen in the afib clinic one week ago, we discussed options to manage her afib. She does not sound that symptomatic. BB was increased and she is for f/u. Her BP has been stable on higher dose of BB. HR's for the most part have her reasonably rate controlled. Offered cardioversion or amiodarone but she defers at this time.  F/u in afib clinic, she is now on cardizem 240 mg a day, dose increased last week when she felt her HR was still running too high. HR in office  now in the 70's. She has thought about options and would like to try a cardioversion. Last one held her almost a year. No missed does of eliquis.  F/u in afib clinic, she had successful cardioversion and continues in SR. She did have one short lived episode of  4:1 atrial flutter after DCCV but then went back into SR. She did not get her usual energy back right after cardioversion this time but is feeling some  better.   F/i in afib clinic, 4/10, unfortunately, pt has returned to afib for the last few weeks and has noted fluctuating HR/ BP readings. At home yesterday reported higher BP readings, in the office today at 156/84. It appears that the pt is getting more symptomatic with her afib and feels fatigued. I discussed with her starting amiodarone to pursue SR and she is in agreement.   F/u in fib clinic, 4/16 ,after starting on amiodarone 200 mg bid. She called the office yesterday with a HR in the upper 40's. BB dose was reduced to 1/2 tab daily. Today, she feels better and EKG shows SR with pac's in the 60's.  F/u in afib clinic, she feels improved but still has some slow heart rates but is not symptomatic. She is staying in [place]. Will reduce amiodarone to 200 mg once a day.  F/u in afib clinic, 05/25/17, she called in and was reporting some low heart rates so my nurse advised to cut BB in half.  Today, she is in aflutter with controlled v rate. Her husband died this week and from the stress may have caused her to get out of rhythm vrs, she also forgot her amiodarone for 4 days.  F/u in afib clinic, pt continues to be in afib despite increasing amiodarone to 200  mg bid. The recent death of her husband has caused a lot of stress which is probably contributing to persistence of afib Discussed cardioversion and pt is in agreement. No missed doses of eliquis.  F/u in afib clinic following cardioversion which was successful and she continues in Sinus brady in the 40's and will lower rate control. She was suppose to come in last week but had a mechanical fall  with a significant abrasion to her knee and is seeing her PCP for dressings.   F/u in afib clinic 12/15/18.She was recently seen by [name], PA, and was found to be back in Afib. She feels that she may  have been out for 2-3 weeks. She is also c/o LLE edema, Rt greater than Left for the last few weeks as well. Twisted the rt ankle last week. She has been  off lasix  for a while for issues with kidney function climbing but she feels her LLE needs some diuretic . She had cardioversion around a year agoand held til recently. She continues on amiodarone 200 mg qd.[name] increased her BB. Ekg shows afib with RVR at 106 bpm.CHA2DS2VASc score of at least 4, continues on eliquis 2.5 mg bid .  Today, she denies symptoms of palpitations, chest pain, shortness of breath, orthopnea, PND, lower extremity edema, dizziness, presyncope, syncope, or neurologic sequela.+ fatigue. The patient is tolerating medications without difficulties and is otherwise without complaint today.   Past Medical History:  Diagnosis Date  . Atrial fibrillation ([place]) 2017   a. s/p DCCV in 10/2015  b. recurrent in 08/2016 --> rate-control pursued.   . Cancer ([place])    Breast  . CKD (chronic kidney disease)   . GCA (giant cell arteritis) ([place])   . Hypertension   . Macular degeneration   . Osteoporosis   . PMR (polymyalgia rheumatica) ([facility])   . Skin cancer    Past Surgical History:  Procedure Laterality Date  . CARDIOVERSION N/A 10/18/2015   Procedure: CARDIOVERSION;  Surgeon: Jerline Pain, MD;  Location: [facility];  Service: Cardiovascular;  Laterality: N/A;  . CARDIOVERSION N/A 02/20/2017   Procedure: CARDIOVERSION;  Surgeon: Pixie Casino, MD;  Location: [facility] ENDOSCOPY;  Service: Cardiovascular;  Laterality: N/A;  . CARDIOVERSION N/A 06/18/2017   Procedure: CARDIOVERSION;  Surgeon: Sanda Klein, MD;  Location: [facility] ENDOSCOPY;  Service: Cardiovascular;  Laterality: N/A;  . KNEE SURGERY  2013  . MASTECTOMY  1998   left side    Current Outpatient Medications  Medication Sig Dispense Refill  . amiodarone (PACERONE) 200 MG tablet Take 1 tablet (200 mg total) by mouth daily. 90 tablet 2  . amLODipine (NORVASC) 10 MG tablet Take 1 tablet (10 mg total) by mouth daily. 90 tablet 2  . Bevacizumab (AVASTIN IV) Eye injection every 6 weeks    . cloNIDine (CATAPRES) 0.2 MG tablet  TAKE 1 TABLET BY MOUTH THREE TIMES DAILY 270 tablet 3  . diazepam (VALIUM) 2 MG tablet Take 2 mg by mouth at bedtime as needed (for sleep).   0  . diltiazem (CARDIZEM) 30 MG tablet Take 30 mg by mouth 2 (two) times daily as needed (as needed for heart rate greater than 100).    [name] ELIQUIS 2.5 MG TABS tablet Take 1 tablet by mouth twice daily 180 tablet 1  . losartan (COZAAR) 100 MG tablet Take 100 mg by mouth daily. Per PCP     . magnesium oxide (MAG-OX) 400 (241.3 Mg) MG tablet Take 2 tablets by  mouth at bedtime as needed.    . metoprolol succinate (TOPROL-XL) 25 MG 24 hr tablet Take 1 tablet (25 mg total) by mouth daily. 90 tablet 2  . Multiple Vitamins-Minerals (PRESERVISION AREDS 2) CAPS Take 1 capsule by mouth daily.    . predniSONE (DELTASONE) 5 MG tablet Take 5 mg by mouth as needed.   0  . pyridoxine (B-6) 100 MG tablet Take 100 mg by mouth daily.    . furosemide (LASIX) 20 MG tablet Take 1 tablet (20 mg total) by mouth daily. 30 tablet 1   No current facility-administered medications for this encounter.     Allergies  Allergen Reactions  . Codeine Itching  . Penicillins Itching, Rash and Other (See Comments)    Has patient had a PCN reaction causing immediate rash, facial/tongue/throat swelling, SOB or lightheadedness with hypotension: Yes Has patient had a PCN reaction causing severe rash involving mucus membranes or skin necrosis: No Has patient had a PCN reaction that required hospitalization: No Has patient had a PCN reaction occurring within the last 10 years: No If all of the above answers are "NO", then may proceed with Cephalosporin use.    Social History   Socioeconomic History  . Marital status: Married    Spouse name: Not on file  . Number of children: 2  . Years of education: Not on file  . Highest education level: Not on file  Occupational History  . Not on file  Social Needs  . Financial resource strain: Not on file  . Food insecurity    Worry: Not on file     Inability: Not on file  . Transportation needs    Medical: Not on file    Non-medical: Not on file  Tobacco Use  . Smoking status: Former Smoker    Quit date: 08/31/1966    Years since quitting: 52.3  . Smokeless tobacco: Never Used  Substance and Sexual Activity  . Alcohol use: No  . Drug use: No  . Sexual activity: Not on file  Lifestyle  . Physical activity    Days per week: Not on file    Minutes per session: Not on file  . Stress: Not on file  Relationships  . Social Herbalist on phone: Not on file    Gets together: Not on file    Attends religious service: Not on file    Active member of club or organization: Not on file    Attends meetings of clubs or organizations: Not on file    Relationship status: Not on file  . Intimate partner violence    Fear of current or ex partner: Not on file    Emotionally abused: Not on file    Physically abused: Not on file    Forced sexual activity: Not on file  Other Topics Concern  . Not on file  Social History Narrative   epworth sleepiness scale = 8 (08/31/15)    Family History  Problem Relation Age of Onset  . Stroke Mother   . Kidney failure Father        died at 5  . Heart disease Sister        died at 70  . Heart disease Sister        died at 6  . Breast cancer Daughter     ROS- All systems are reviewed and negative except as per the HPI above  Physical Exam: Vitals:   12/15/18 1346  BP: [name])  150/70  Pulse: (!) 106  Weight: 70.1 kg  Height: 5\' 6"  (1.676 m)   Wt Readings from Last 3 Encounters:  12/15/18 70.1 kg  11/29/18 67.5 kg  05/28/18 67.6 kg    Labs: Lab Results  Component Value Date   NA 141 06/04/2018   K 4.8 06/04/2018   CL 102 06/04/2018   CO2 21 06/04/2018   GLUCOSE 84 06/04/2018   BUN 29 06/04/2018   CREATININE 1.91 (H) 06/04/2018   CALCIUM 9.3 06/04/2018   MG 2.2 08/18/2016   Lab Results  Component Value Date   INR 1.1 10/11/2015   Lab Results  Component Value  Date   CHOL  07/04/2009    81        ATP III CLASSIFICATION:  <200     mg/dL   Desirable  200-239  mg/dL   Borderline High  >=240    mg/dL   High          HDL 18 (L) 07/04/2009   LDLCALC  07/04/2009    50        Total Cholesterol/HDL:CHD Risk Coronary Heart Disease Risk Table                     Men   Women  1/2 Average Risk   3.4   3.3  Average Risk       5.0   4.4  2 X Average Risk   9.6   7.1  3 X Average Risk  23.4   11.0        Use the calculated Patient Ratio above and the CHD Risk Table to determine the patient's CHD Risk.        ATP III CLASSIFICATION (LDL):  <100     mg/dL   Optimal  100-129  mg/dL   Near or Above                    Optimal  130-159  mg/dL   Borderline  160-189  mg/dL   High  >190     mg/dL   Very High   TRIG 64 07/04/2009     GEN- The patient is well appearing, alert and oriented x 3 today.   Head- normocephalic, atraumatic Eyes-  Sclera clear, conjunctiva pink Ears- hearing intact Oropharynx- clear Neck- supple, no JVP Lymph- no cervical lymphadenopathy Lungs- Clear to ausculation bilaterally, normal work of breathing Heart- regular rate and rhythm, (SR with PAc's) no murmurs, rubs or gallops, PMI not laterally displaced GI- soft, NT, ND, + BS Extremities- no clubbing, cyanosis, hard edema  rt worse than left  MS- no significant deformity or atrophy Skin- no rash or lesion Psych- euthymic mood, full affect Neuro- strength and sensation are intact  EKG- Marked sinus brady at 43 bpm, pr int 280 ms, qrs int 86 ms, qtc 398 ms.    Assessment and Plan: 1. Persistent  afib/flutter Underwent  loading of amiodarone and successful cardioversion 06/18/17.   Now back in afib for 2-3 weeks  Will plan on repeat cardioversion Continue amiodarone 200 mg  qd Reduce  toprol from  25 mg daily(recently increased back  to 12.5 mg daily on am of cardioversion sh she is slow in SR   She will continue apixaban 2.5 mg bid for chadsvasc score of 4    States no know missed does x 3 weeks   2. HTN Stable  3. LLE  Feels the need for diuretic Had been on  40 mg  daily and was stopped June  2020 for increase of creatinine  Will restart 20 mg daily Cmet/tsh/cbc today   Will ask for istat am of cardioversion   Afib clinic one week after cardioversion   Butch Penny C. Ruari Mudgett, [facility] [address] [place],  [address] [phone]

## 2018-12-17 ENCOUNTER — Other Ambulatory Visit (HOSPITAL_COMMUNITY)
Admission: RE | Admit: 2018-12-17 | Discharge: 2018-12-17 | Disposition: A | Discharge status: Home / Self Care | Payer: Medicare Other | Source: Ambulatory Visit | Attending: Cardiovascular Disease | Admitting: Cardiovascular Disease

## 2018-12-17 ENCOUNTER — Other Ambulatory Visit (HOSPITAL_COMMUNITY): Payer: Self-pay | Admitting: *Deleted

## 2018-12-17 DIAGNOSIS — Z01812 Encounter for preprocedural laboratory examination: Secondary | ICD-10-CM | POA: Diagnosis present

## 2018-12-17 DIAGNOSIS — Z20828 Contact with and (suspected) exposure to other viral communicable diseases: Secondary | ICD-10-CM | POA: Diagnosis not present

## 2018-12-17 MED ORDER — FUROSEMIDE 20 MG PO TABS: 20.0000 mg | ORAL_TABLET | ORAL | 1 refills | Status: DC

## 2018-12-19 LAB — NOVEL CORONAVIRUS, NAA (HOSP ORDER, SEND-OUT TO REF LAB; TAT 18-24 HRS): SARS-CoV-2, NAA: NOT DETECTED

## 2018-12-21 ENCOUNTER — Ambulatory Visit (HOSPITAL_COMMUNITY)
Admission: RE | Admit: 2018-12-21 | Discharge: 2018-12-21 | Disposition: A | Discharge status: Home / Self Care | Payer: Medicare Other | Attending: Cardiovascular Disease | Admitting: Cardiovascular Disease

## 2018-12-21 ENCOUNTER — Ambulatory Visit (HOSPITAL_COMMUNITY): Payer: Medicare Other | Admitting: Certified Registered Nurse Anesthetist

## 2018-12-21 ENCOUNTER — Encounter (HOSPITAL_COMMUNITY)
Admission: RE | Disposition: A | Discharge status: Home / Self Care | Payer: Self-pay | Source: Home / Self Care | Attending: Cardiovascular Disease

## 2018-12-21 ENCOUNTER — Other Ambulatory Visit: Payer: Self-pay

## 2018-12-21 DIAGNOSIS — M316 Other giant cell arteritis: Secondary | ICD-10-CM | POA: Insufficient documentation

## 2018-12-21 DIAGNOSIS — I4892 Unspecified atrial flutter: Secondary | ICD-10-CM | POA: Insufficient documentation

## 2018-12-21 DIAGNOSIS — I4819 Other persistent atrial fibrillation: Secondary | ICD-10-CM | POA: Diagnosis present

## 2018-12-21 DIAGNOSIS — Z8249 Family history of ischemic heart disease and other diseases of the circulatory system: Secondary | ICD-10-CM | POA: Diagnosis not present

## 2018-12-21 DIAGNOSIS — Z79899 Other long term (current) drug therapy: Secondary | ICD-10-CM | POA: Insufficient documentation

## 2018-12-21 DIAGNOSIS — I4891 Unspecified atrial fibrillation: Secondary | ICD-10-CM

## 2018-12-21 DIAGNOSIS — Z885 Allergy status to narcotic agent status: Secondary | ICD-10-CM | POA: Diagnosis not present

## 2018-12-21 DIAGNOSIS — Z87891 Personal history of nicotine dependence: Secondary | ICD-10-CM | POA: Insufficient documentation

## 2018-12-21 DIAGNOSIS — M353 Polymyalgia rheumatica: Secondary | ICD-10-CM | POA: Insufficient documentation

## 2018-12-21 DIAGNOSIS — I129 Hypertensive chronic kidney disease with stage 1 through stage 4 chronic kidney disease, or unspecified chronic kidney disease: Secondary | ICD-10-CM | POA: Insufficient documentation

## 2018-12-21 DIAGNOSIS — Z7901 Long term (current) use of anticoagulants: Secondary | ICD-10-CM | POA: Diagnosis not present

## 2018-12-21 DIAGNOSIS — R001 Bradycardia, unspecified: Secondary | ICD-10-CM | POA: Diagnosis not present

## 2018-12-21 DIAGNOSIS — N189 Chronic kidney disease, unspecified: Secondary | ICD-10-CM | POA: Diagnosis not present

## 2018-12-21 DIAGNOSIS — M1990 Unspecified osteoarthritis, unspecified site: Secondary | ICD-10-CM | POA: Diagnosis not present

## 2018-12-21 DIAGNOSIS — Z880 Allergy status to penicillin: Secondary | ICD-10-CM | POA: Insufficient documentation

## 2018-12-21 HISTORY — PX: CARDIOVERSION: SHX1299

## 2018-12-21 LAB — POCT I-STAT, CHEM 8
BUN: 22 mg/dL (ref 8–23)
Calcium, Ion: 1.05 mmol/L — ABNORMAL LOW (ref 1.15–1.40)
Chloride: 107 mmol/L (ref 98–111)
Creatinine, Ser: 1.6 mg/dL — ABNORMAL HIGH (ref 0.44–1.00)
Glucose, Bld: 102 mg/dL — ABNORMAL HIGH (ref 70–99)
HCT: 40 % (ref 36.0–46.0)
Hemoglobin: 13.6 g/dL (ref 12.0–15.0)
Potassium: 4.5 mmol/L (ref 3.5–5.1)
Sodium: 140 mmol/L (ref 135–145)
TCO2: 25 mmol/L (ref 22–32)

## 2018-12-21 SURGERY — CARDIOVERSION
Anesthesia: General

## 2018-12-21 MED ORDER — SODIUM CHLORIDE 0.9 % IV SOLN: INTRAVENOUS | Status: DC | PRN

## 2018-12-21 MED ORDER — PROPOFOL 10 MG/ML IV BOLUS
INTRAVENOUS | Status: DC | PRN
  Administered 2018-12-21: 40 mg via INTRAVENOUS
  Administered 2018-12-21: 20 mg via INTRAVENOUS

## 2018-12-21 MED ORDER — LIDOCAINE 2% (20 MG/ML) 5 ML SYRINGE
INTRAMUSCULAR | Status: DC | PRN
  Administered 2018-12-21: 40 mg via INTRAVENOUS

## 2018-12-21 NOTE — Interval H&P Note (Signed)
History and Physical Interval Note:  12/21/2018 1:12 PM  Allison Norman  has presented today for surgery, with the diagnosis of atrial fibrillation.  The various methods of treatment have been discussed with the patient and family. After consideration of risks, benefits and other options for treatment, the patient has consented to  Procedure(s): CARDIOVERSION (N/A) as a surgical intervention.  The patient's history has been reviewed, patient examined, no change in status, stable for surgery.  I have reviewed the patient's chart and labs.  Questions were answered to the patient's satisfaction.     Allison Norman

## 2018-12-21 NOTE — Anesthesia Preprocedure Evaluation (Addendum)
Anesthesia Evaluation  Patient identified by MRN, date of birth, ID band Patient awake    Reviewed: Allergy & Precautions, NPO status , Patient's Chart, lab work & pertinent test results, reviewed documented beta blocker date and time   History of Anesthesia Complications Negative for: history of anesthetic complications  Airway Mallampati: II  TM Distance: >3 FB Neck ROM: Full    Dental   Pulmonary neg pulmonary ROS, former smoker,    Pulmonary exam normal        Cardiovascular hypertension, Pt. on home beta blockers and Pt. on medications Normal cardiovascular exam+ dysrhythmias (on Eliquis) Atrial Fibrillation   TTE 2017: EF 60-65%, mild MR, moderate LAE, moderate RAE, PASP 26mmHg    Neuro/Psych negative neurological ROS  negative psych ROS   GI/Hepatic negative GI ROS, Neg liver ROS,   Endo/Other  negative endocrine ROS  Renal/GU Renal InsufficiencyRenal disease  negative genitourinary   Musculoskeletal Polymyalgia rheumatica   Abdominal   Peds  Hematology negative hematology ROS (+)   Anesthesia Other Findings Echo 09/13/15: The patient was in atrial fibrillation. Normal LV size with EF   60-65%. Normal RV size and systolic function. Mild mitral   regurgitation. Moderate biatrial enlargement. Mild pulmonary   hypertension.  Reproductive/Obstetrics                          Anesthesia Physical Anesthesia Plan  ASA: III  Anesthesia Plan: General   Post-op Pain Management:    Induction: Intravenous  PONV Risk Score and Plan: TIVA and Treatment may vary due to age or medical condition  Airway Management Planned: Mask  Additional Equipment: None  Intra-op Plan:   Post-operative Plan:   Informed Consent: I have reviewed the patients History and Physical, chart, labs and discussed the procedure including the risks, benefits and alternatives for the proposed anesthesia with the patient  or authorized representative who has indicated his/her understanding and acceptance.       Plan Discussed with:   Anesthesia Plan Comments:        Anesthesia Quick Evaluation

## 2018-12-21 NOTE — Transfer of Care (Signed)
Immediate Anesthesia Transfer of Care Note  Patient: Allison Norman  Procedure(s) Performed: CARDIOVERSION (N/A )  Patient Location: Endoscopy Unit  Anesthesia Type:General  Level of Consciousness: drowsy and patient cooperative  Airway & Oxygen Therapy: Patient Spontanous Breathing  Post-op Assessment: Report given to RN and Post -op Vital signs reviewed and stable  Post vital signs: Reviewed and stable  Last Vitals:  Vitals Value Taken Time  BP 99/39   Temp    Pulse 52   Resp 16   SpO2 93     Last Pain:  Vitals:   12/21/18 1225  PainSc: 0-No pain         Complications: No apparent anesthesia complications

## 2018-12-21 NOTE — Discharge Instructions (Signed)

## 2018-12-21 NOTE — CV Procedure (Signed)
    Cardioversion Note  PENNE ROSENSTOCK [id number] 10/09/1924  Procedure: DC Cardioversion Indications: atrial fib   Procedure Details Consent: Obtained Time Out: Verified patient identification, verified procedure, site/side was marked, verified correct patient position, special equipment/implants available, Radiology Safety Procedures followed,  medications/allergies/relevent history reviewed, required imaging and test results available.  Performed  The patient has been on adequate anticoagulation.  The patient received IV Lidocaine 4 mg IV followed by Propofol 70 mg IV   for sedation.  Synchronous cardioversion was performed at  120  joules.  The cardioversion was successful.      Complications: No apparent complications Patient did tolerate procedure well.   Thayer Headings, Brooke Bonito., MD, [facility] 12/21/2018, 1:22 PM

## 2018-12-21 NOTE — Anesthesia Postprocedure Evaluation (Signed)
Anesthesia Post Note  Patient: AYLEN STRADFORD  Procedure(s) Performed: CARDIOVERSION (N/A )     Patient location during evaluation: Endoscopy Anesthesia Type: General Level of consciousness: awake and alert Pain management: pain level controlled Vital Signs Assessment: post-procedure vital signs reviewed and stable Respiratory status: spontaneous breathing, nonlabored ventilation and respiratory function stable Cardiovascular status: blood pressure returned to baseline and stable Postop Assessment: no apparent nausea or vomiting Anesthetic complications: no    Last Vitals:  Vitals:   12/21/18 1335 12/21/18 1345  BP: (!) 108/42 (!) 121/48  Pulse: (!) 51 (!) 52  Resp: 20 (!) 23  SpO2: 93% 96%    Last Pain:  Vitals:   12/21/18 1345  PainSc: 0-No pain                 Lidia Collum

## 2018-12-28 ENCOUNTER — Ambulatory Visit (HOSPITAL_COMMUNITY)
Admission: RE | Admit: 2018-12-28 | Discharge: 2018-12-28 | Disposition: A | Discharge status: Home / Self Care | Payer: Medicare Other | Source: Ambulatory Visit | Attending: Nurse Practitioner | Admitting: Nurse Practitioner

## 2018-12-28 ENCOUNTER — Other Ambulatory Visit: Payer: Self-pay

## 2018-12-28 ENCOUNTER — Encounter (HOSPITAL_COMMUNITY): Payer: Self-pay | Admitting: Nurse Practitioner

## 2018-12-28 VITALS — BP 146/50 | HR 55 | Ht 72.0 in | Wt 153.4 lb

## 2018-12-28 DIAGNOSIS — Z823 Family history of stroke: Secondary | ICD-10-CM | POA: Insufficient documentation

## 2018-12-28 DIAGNOSIS — Z880 Allergy status to penicillin: Secondary | ICD-10-CM | POA: Diagnosis not present

## 2018-12-28 DIAGNOSIS — Z885 Allergy status to narcotic agent status: Secondary | ICD-10-CM | POA: Insufficient documentation

## 2018-12-28 DIAGNOSIS — Z841 Family history of disorders of kidney and ureter: Secondary | ICD-10-CM | POA: Diagnosis not present

## 2018-12-28 DIAGNOSIS — Z79899 Other long term (current) drug therapy: Secondary | ICD-10-CM | POA: Diagnosis not present

## 2018-12-28 DIAGNOSIS — R600 Localized edema: Secondary | ICD-10-CM | POA: Insufficient documentation

## 2018-12-28 DIAGNOSIS — Z9012 Acquired absence of left breast and nipple: Secondary | ICD-10-CM | POA: Diagnosis not present

## 2018-12-28 DIAGNOSIS — Z803 Family history of malignant neoplasm of breast: Secondary | ICD-10-CM | POA: Diagnosis not present

## 2018-12-28 DIAGNOSIS — Z85828 Personal history of other malignant neoplasm of skin: Secondary | ICD-10-CM | POA: Diagnosis not present

## 2018-12-28 DIAGNOSIS — I4891 Unspecified atrial fibrillation: Secondary | ICD-10-CM | POA: Diagnosis present

## 2018-12-28 DIAGNOSIS — Z87891 Personal history of nicotine dependence: Secondary | ICD-10-CM | POA: Insufficient documentation

## 2018-12-28 DIAGNOSIS — I129 Hypertensive chronic kidney disease with stage 1 through stage 4 chronic kidney disease, or unspecified chronic kidney disease: Secondary | ICD-10-CM | POA: Insufficient documentation

## 2018-12-28 DIAGNOSIS — D6869 Other thrombophilia: Secondary | ICD-10-CM

## 2018-12-28 DIAGNOSIS — I4892 Unspecified atrial flutter: Secondary | ICD-10-CM | POA: Diagnosis not present

## 2018-12-28 DIAGNOSIS — Z7901 Long term (current) use of anticoagulants: Secondary | ICD-10-CM | POA: Diagnosis not present

## 2018-12-28 DIAGNOSIS — I4819 Other persistent atrial fibrillation: Secondary | ICD-10-CM | POA: Insufficient documentation

## 2018-12-28 DIAGNOSIS — N189 Chronic kidney disease, unspecified: Secondary | ICD-10-CM | POA: Diagnosis not present

## 2018-12-28 DIAGNOSIS — Z8249 Family history of ischemic heart disease and other diseases of the circulatory system: Secondary | ICD-10-CM | POA: Insufficient documentation

## 2018-12-28 DIAGNOSIS — Z853 Personal history of malignant neoplasm of breast: Secondary | ICD-10-CM | POA: Insufficient documentation

## 2018-12-28 DIAGNOSIS — M353 Polymyalgia rheumatica: Secondary | ICD-10-CM | POA: Insufficient documentation

## 2018-12-28 DIAGNOSIS — I480 Paroxysmal atrial fibrillation: Secondary | ICD-10-CM

## 2018-12-28 MED ORDER — FUROSEMIDE 20 MG PO TABS: 20.0000 mg | ORAL_TABLET | ORAL | 1 refills | Status: DC

## 2018-12-28 NOTE — Progress Notes (Signed)
Primary Care Physician: Allison Cruel, MD Referring Physician: Dr. William Norman is a 85 y.o. female, appearing younger than stated age,with a h/o paroxysmal afib, dx fall of 2017, with successful cardioversion, but has had increase in  afib burden  since August. She was in [place] in November when seen by Dr. Debara Norman. She is in the afib clinic to discuss options to treat.. Pt states that she has been under a lot of stress due to her 42 yo husband with progressive dementia and Hospice is now in the home. She feel that she has been in persistent afib since December.  She is busy during the day and does not notice her afib that much. She does notice it for the first several minutes trying to get to sleep. Does not notice any undo shortness of breath with exertion or fatigue. She is on apixaban correctly dosed at 2.5 mg bid for a chadsvasc score of 4. She noted increase in HR around 1/9 and was advised to go to the ER, waited 6 hours and had not been seen so left. She feels this elevated HR was due to stress over husband.  When seen in the afib clinic one week ago, we discussed options to manage her afib. She does not sound that symptomatic. BB was increased and she is for f/u. Her BP has been stable on higher dose of BB. HR's for the most part have her reasonably rate controlled. Offered cardioversion or amiodarone but she defers at this time.  F/u in afib clinic, she is now on cardizem 240 mg a day, dose increased last week when she felt her HR was still running too high. HR in office  now in the 70's. She has thought about options and would like to try a cardioversion. Last one held her almost a year. No missed does of eliquis.  F/u in afib clinic, she had successful cardioversion and continues in SR. She did have one short lived episode of  4:1 atrial flutter after DCCV but then went back into SR. She did not get her usual energy back right after cardioversion this time but is feeling some  better.   F/i in afib clinic, 4/10, unfortunately, pt has returned to afib for the last few weeks and has noted fluctuating HR/ BP readings. At home yesterday reported higher BP readings, in the office today at 156/84. It appears that the pt is getting more symptomatic with her afib and feels fatigued. I discussed with her starting amiodarone to pursue SR and she is in agreement.   F/u in fib clinic, 4/16 ,after starting on amiodarone 200 mg bid. She called the office yesterday with a HR in the upper 40's. BB dose was reduced to 1/2 tab daily. Today, she feels better and EKG shows SR with pac's in the 60's.  F/u in afib clinic, she feels improved but still has some slow heart rates but is not symptomatic. She is staying in [place]. Will reduce amiodarone to 200 mg once a day.  F/u in afib clinic, 05/25/17, she called in and was reporting some low heart rates so my nurse advised to cut BB in half.  Today, she is in aflutter with controlled v rate. Her husband died this week and from the stress may have caused her to get out of rhythm vrs, she also forgot her amiodarone for 4 days.  F/u in afib clinic, pt continues to be in afib despite increasing amiodarone to 200  mg bid. The recent death of her husband has caused a lot of stress which is probably contributing to persistence of afib Discussed cardioversion and pt is in agreement. No missed doses of eliquis.  F/u in afib clinic following cardioversion which was successful and she continues in Sinus brady in the 40's and will lower rate control. She was suppose to come in last week but had a mechanical fall  with a significant abrasion to her knee and is seeing her PCP for dressings.   F/u in afib clinic 12/15/18.She was recently seen by Allison Ransom, PA, and was found to be back in Afib. She feels that she may  have been out for 2-3 weeks. She is also c/o LLE edema, Rt greater than Left for the last few weeks as well. Twisted the rt ankle last week. She has been  off lasix  for a while for issues with kidney function climbing but she feels her LLE needs some diuretic . She had cardioversion around a year agoand held til recently. She continues on amiodarone 200 mg qd.Allison Norman increased her BB. Ekg shows afib with RVR at 106 bpm.CHA2DS2VASc score of at least 4, continues on eliquis 2.5 mg bid .  F/u with afib clinic s/p cardioversion, 12/22.  It was successful and she continues in SR. I put her back on lasix 20 mg every other day, which she took up til cardioversion but then stopped as she was not sure to take it. She is very concerned re her LLE, which has progressively gotten worse sine lasix was stopped in the fall 2/2 rising creatinine. She  felt that she had some throat congestion for a couple nights after the cardioversion. She really does not  feel better being in rhythm.   Today, she denies symptoms of palpitations, chest pain, shortness of breath, orthopnea, PND, lower extremity edema, dizziness, presyncope, syncope, or neurologic sequela.+ fatigue. The patient is tolerating medications without difficulties and is otherwise without complaint today.   Past Medical History:  Diagnosis Date  . Atrial fibrillation ([address]) 2017   a. s/p DCCV in 10/2015  b. recurrent in 08/2016 --> rate-control pursued.   . Cancer ([place])    Breast  . CKD (chronic kidney disease)   . GCA (giant cell arteritis) ([place])   . Hypertension   . Macular degeneration   . Osteoporosis   . PMR (polymyalgia rheumatica) ([facility])   . Skin cancer    Past Surgical History:  Procedure Laterality Date  . CARDIOVERSION N/A 10/18/2015   Procedure: CARDIOVERSION;  Surgeon: Jerline Pain, MD;  Location: [facility];  Service: Cardiovascular;  Laterality: N/A;  . CARDIOVERSION N/A 02/20/2017   Procedure: CARDIOVERSION;  Surgeon: Pixie Casino, MD;  Location: [facility] ENDOSCOPY;  Service: Cardiovascular;  Laterality: N/A;  . CARDIOVERSION N/A 06/18/2017   Procedure: CARDIOVERSION;  Surgeon: Sanda Klein, MD;  Location: [facility] ENDOSCOPY;  Service: Cardiovascular;  Laterality: N/A;  . CARDIOVERSION N/A 12/21/2018   Procedure: CARDIOVERSION;  Surgeon: Thayer Headings, MD;  Location: [facility] ENDOSCOPY;  Service: Cardiovascular;  Laterality: N/A;  . KNEE SURGERY  2013  . MASTECTOMY  1998   left side    Current Outpatient Medications  Medication Sig Dispense Refill  . amiodarone (PACERONE) 200 MG tablet Take 1 tablet (200 mg total) by mouth daily. 90 tablet 2  . amLODipine (NORVASC) 10 MG tablet Take 1 tablet (10 mg total) by mouth daily. 90 tablet 2  . b complex vitamins tablet Take 1 tablet  by mouth daily.    . Bevacizumab (AVASTIN IV) Eye injection every 6 weeks    . cloNIDine (CATAPRES) 0.2 MG tablet TAKE 1 TABLET BY MOUTH THREE TIMES DAILY 270 tablet 3  . diazepam (VALIUM) 2 MG tablet Take 2 mg by mouth at bedtime as needed (for sleep).   0  . diltiazem (CARDIZEM) 30 MG tablet Take 30 mg by mouth 2 (two) times daily as needed (as needed for heart rate greater than 100).    [name] ELIQUIS 2.5 MG TABS tablet Take 1 tablet by mouth twice daily 180 tablet 1  . losartan (COZAAR) 100 MG tablet Take 100 mg by mouth daily. Per PCP     . magnesium oxide (MAG-OX) 400 (241.3 Mg) MG tablet Take 2 tablets by mouth at bedtime.     . metoprolol succinate (TOPROL-XL) 25 MG 24 hr tablet Take 1 tablet (25 mg total) by mouth daily. (Patient taking differently: Take 12.5 mg by mouth daily. ) 90 tablet 2  . Multiple Vitamins-Minerals (PRESERVISION AREDS 2) CAPS Take 1 capsule by mouth daily.    . predniSONE (DELTASONE) 5 MG tablet Take 5 mg by mouth 3 (three) times a week.   0  . furosemide (LASIX) 20 MG tablet Take 1 tablet (20 mg total) by mouth every other day. 30 tablet 1   No current facility-administered medications for this encounter.    Allergies  Allergen Reactions  . Codeine Itching  . Penicillins Itching, Rash and Other (See Comments)    Has patient had a PCN reaction causing immediate rash,  facial/tongue/throat swelling, SOB or lightheadedness with hypotension: Yes Has patient had a PCN reaction causing severe rash involving mucus membranes or skin necrosis: No Has patient had a PCN reaction that required hospitalization: No Has patient had a PCN reaction occurring within the last 10 years: No If all of the above answers are "NO", then may proceed with Cephalosporin use.    Social History   Socioeconomic History  . Marital status: Married    Spouse name: Not on file  . Number of children: 2  . Years of education: Not on file  . Highest education level: Not on file  Occupational History  . Not on file  Tobacco Use  . Smoking status: Former Smoker    Quit date: 08/31/1966    Years since quitting: 52.3  . Smokeless tobacco: Never Used  Substance and Sexual Activity  . Alcohol use: No  . Drug use: No  . Sexual activity: Not on file  Other Topics Concern  . Not on file  Social History Narrative   epworth sleepiness scale = 8 (08/31/15)   Social Determinants of Health   Financial Resource Strain:   . Difficulty of Paying Living Expenses: Not on file  Food Insecurity:   . Worried About Charity fundraiser in the Last Year: Not on file  . Ran Out of Food in the Last Year: Not on file  Transportation Needs:   . Lack of Transportation (Medical): Not on file  . Lack of Transportation (Non-Medical): Not on file  Physical Activity:   . Days of Exercise per Week: Not on file  . Minutes of Exercise per Session: Not on file  Stress:   . Feeling of Stress : Not on file  Social Connections:   . Frequency of Communication with Friends and Family: Not on file  . Frequency of Social Gatherings with Friends and Family: Not on file  . Attends Religious Services:  Not on file  . Active Member of Clubs or Organizations: Not on file  . Attends Archivist Meetings: Not on file  . Marital Status: Not on file  Intimate Partner Violence:   . Fear of Current or Ex-Partner:  Not on file  . Emotionally Abused: Not on file  . Physically Abused: Not on file  . Sexually Abused: Not on file    Family History  Problem Relation Age of Onset  . Stroke Mother   . Kidney failure Father        died at 86  . Heart disease Sister        died at 95  . Heart disease Sister        died at 8  . Breast cancer Daughter     ROS- All systems are reviewed and negative except as per the HPI above  Physical Exam: Vitals:   12/28/18 1333  BP: (!) 146/50  Pulse: (!) 55  Weight: 69.6 kg  Height: 6' (1.829 m)   Wt Readings from Last 3 Encounters:  12/28/18 69.6 kg  12/15/18 70.1 kg  11/29/18 67.5 kg    Labs: Lab Results  Component Value Date   NA 140 12/21/2018   K 4.5 12/21/2018   CL 107 12/21/2018   CO2 22 12/15/2018   GLUCOSE 102 (H) 12/21/2018   BUN 22 12/21/2018   CREATININE 1.60 (H) 12/21/2018   CALCIUM 9.0 12/15/2018   MG 2.2 08/18/2016   Lab Results  Component Value Date   INR 1.1 10/11/2015   Lab Results  Component Value Date   CHOL  07/04/2009    81        ATP III CLASSIFICATION:  <200     mg/dL   Desirable  200-239  mg/dL   Borderline High  >=240    mg/dL   High          HDL 18 (L) 07/04/2009   LDLCALC  07/04/2009    50        Total Cholesterol/HDL:CHD Risk Coronary Heart Disease Risk Table                     Men   Women  1/2 Average Risk   3.4   3.3  Average Risk       5.0   4.4  2 X Average Risk   9.6   7.1  3 X Average Risk  23.4   11.0        Use the calculated Patient Ratio above and the CHD Risk Table to determine the patient's CHD Risk.        ATP III CLASSIFICATION (LDL):  <100     mg/dL   Optimal  100-129  mg/dL   Near or Above                    Optimal  130-159  mg/dL   Borderline  160-189  mg/dL   High  >190     mg/dL   Very High   TRIG 64 07/04/2009     GEN- The patient is well appearing, alert and oriented x 3 today.   Head- normocephalic, atraumatic Eyes-  Sclera clear, conjunctiva pink Ears-  hearing intact Oropharynx- clear Neck- supple, no JVP Lymph- no cervical lymphadenopathy Lungs- Clear to ausculation bilaterally, normal work of breathing Heart- regular rate and rhythm, (SR with PAc's) no murmurs, rubs or gallops, PMI not laterally displaced GI- soft, NT, ND, +  BS Extremities- no clubbing, cyanosis, hard edema  rt worse than left  MS- no significant deformity or atrophy Skin- no rash or lesion Psych- euthymic mood, full affect Neuro- strength and sensation are intact  EKG- Sinus brady at 55 bpm, pr int 282 ms, qrs int 98 ms, qtc 424 ms    Assessment and Plan: 1. Persistent  afib/flutter Underwent  loading of amiodarone and successful cardioversion 06/18/17.   Returned to  afib for 2-3 weeks  Successful cardioversion Continue amiodarone 200 mg  qd Continue toprol at 12.5 mg daily  She will continue apixaban 2.5 mg bid for chadsvasc score of 4    2. HTN Stable  3. LLE  Really is concerned re her LLE Will start back lasix 30 mg x 3 days then 20 mg qod day and will ask for f/u with Dr. Debara Norman to further discuss in the nest 2 weeks Have to be careful with diuretics as she does have  CKD  Had been on 40 mg  daily and was stopped June  2020 for increase of creatinine  ? Reduce  dose of amlodipine  Knee high support socks encouraged  Follow low salt diet   afib clinic as needed   Butch Penny C. Shaqueta Casady, [facility] [address] [place], [place] [address] [phone]

## 2018-12-28 NOTE — Patient Instructions (Signed)
Increase Furosemide 30mg (1.5 tablets) three days then decrease 20mg (1 tablet) every other day

## 2019-01-19 ENCOUNTER — Other Ambulatory Visit: Payer: Self-pay

## 2019-01-19 ENCOUNTER — Ambulatory Visit (INDEPENDENT_AMBULATORY_CARE_PROVIDER_SITE_OTHER): Payer: Medicare Other | Admitting: Cardiology

## 2019-01-19 VITALS — BP 166/70 | HR 70 | Temp 97.4°F | Ht 65.5 in | Wt 148.4 lb

## 2019-01-19 DIAGNOSIS — R600 Localized edema: Secondary | ICD-10-CM | POA: Diagnosis not present

## 2019-01-19 DIAGNOSIS — I10 Essential (primary) hypertension: Secondary | ICD-10-CM

## 2019-01-19 DIAGNOSIS — I480 Paroxysmal atrial fibrillation: Secondary | ICD-10-CM

## 2019-01-19 DIAGNOSIS — Z7901 Long term (current) use of anticoagulants: Secondary | ICD-10-CM

## 2019-01-19 DIAGNOSIS — N184 Chronic kidney disease, stage 4 (severe): Secondary | ICD-10-CM | POA: Diagnosis not present

## 2019-01-19 MED ORDER — FUROSEMIDE 20 MG PO TABS: 20.0000 mg | ORAL_TABLET | Freq: Every day | ORAL | 1 refills | Status: DC

## 2019-01-19 MED ORDER — AMLODIPINE BESYLATE 5 MG PO TABS: 5.0000 mg | ORAL_TABLET | Freq: Every day | ORAL | 0 refills | Status: DC

## 2019-01-19 NOTE — Patient Instructions (Signed)
Medication Instructions:  DECREASE Norvasc to 5mg  take 1 tablet once a day  INCREASE Lasix to 20mg  Take 1 tablet once a day  *If you need a refill on your cardiac medications before your next appointment, please call your pharmacy*  Lab Work: None  If you have labs (blood work) drawn today and your tests are completely normal, you will receive your results only by: [name] MyChart Message (if you have MyChart) OR . A paper copy in the mail If you have any lab test that is abnormal or we need to change your treatment, we will call you to review the results.  Testing/Procedures: None   Follow-Up: At [facility], you and your health needs are our priority.  As part of our continuing mission to provide you with exceptional heart care, we have created designated Provider Care Teams.  These Care Teams include your primary Cardiologist (physician) and Advanced Practice Providers (APPs -  Physician Assistants and Nurse Practitioners) who all work together to provide you with the care you need, when you need it.  Your next appointment:   2-3 week(s)  The format for your next appointment:   Virtual Visit   Provider:   [name], PA-C  Other Instructions

## 2019-01-19 NOTE — Progress Notes (Signed)
Cardiology Office Note:    Date:  01/19/2019   ID:  Allison Norman, DOB 03-01-1924, MRN [id number]  PCP:  [name], MD  Cardiologist:  Pixie Casino, MD  Electrophysiologist:  None   Referring MD: [name], MD   Chief Complaint  Patient presents with  . Edema    feet    History of Present Illness:    Allison Norman is a 86 y.o. female with a hx of PAF, hypertension, chronic renal insufficiency, and chronic anticoagulation.  She was seen in the office in November 2020 and was in atrial fibrillation.  Was decided to proceed with cardioversion, this was done 12/21/2018.  She has maintained sinus rhythm since.  She has had issues with hypertension.  She is also had lower extremity edema.  Because of her renal issues she has been told to take her diuretic only 3 times a week and to push fluids.  Because of her hypertension her amlodipine has been increased to 10 mg.  She now complains of painful lower extremity edema.  She is already on losartan 100 mg a day and clonidine 0.2 mg twice daily in addition to Amlodipine 10 mg daily.  Past Medical History:  Diagnosis Date  . Atrial fibrillation ([place]) 2017   a. s/p DCCV in 10/2015  b. recurrent in 08/2016 --> rate-control pursued.   . Cancer ([place])    Breast  . CKD (chronic kidney disease)   . GCA (giant cell arteritis) ([place])   . Hypertension   . Macular degeneration   . Osteoporosis   . PMR (polymyalgia rheumatica) ([facility])   . Skin cancer     Past Surgical History:  Procedure Laterality Date  . CARDIOVERSION N/A 10/18/2015   Procedure: CARDIOVERSION;  Surgeon: Jerline Pain, MD;  Location: [facility];  Service: Cardiovascular;  Laterality: N/A;  . CARDIOVERSION N/A 02/20/2017   Procedure: CARDIOVERSION;  Surgeon: Pixie Casino, MD;  Location: [facility] ENDOSCOPY;  Service: Cardiovascular;  Laterality: N/A;  . CARDIOVERSION N/A 06/18/2017   Procedure: CARDIOVERSION;  Surgeon: Sanda Klein, MD;  Location: [place]  ENDOSCOPY;  Service: Cardiovascular;  Laterality: N/A;  . CARDIOVERSION N/A 12/21/2018   Procedure: CARDIOVERSION;  Surgeon: Thayer Headings, MD;  Location: [facility] ENDOSCOPY;  Service: Cardiovascular;  Laterality: N/A;  . KNEE SURGERY  2013  . MASTECTOMY  1998   left side    Current Medications: Current Meds  Medication Sig  . amiodarone (PACERONE) 200 MG tablet Take 1 tablet (200 mg total) by mouth daily.  [name] amLODipine (NORVASC) 10 MG tablet Take 1 tablet (10 mg total) by mouth daily.  [name] b complex vitamins tablet Take 1 tablet by mouth daily.  . Bevacizumab (AVASTIN IV) Eye injection every 6 weeks  . cloNIDine (CATAPRES) 0.2 MG tablet TAKE 1 TABLET BY MOUTH THREE TIMES DAILY  . diazepam (VALIUM) 2 MG tablet Take 2 mg by mouth at bedtime as needed (for sleep).   [name] diltiazem (CARDIZEM) 30 MG tablet Take 30 mg by mouth 2 (two) times daily as needed (as needed for heart rate greater than 100).  [name] ELIQUIS 2.5 MG TABS tablet Take 1 tablet by mouth twice daily  . furosemide (LASIX) 20 MG tablet Take 1 tablet (20 mg total) by mouth every other day.  . losartan (COZAAR) 100 MG tablet Take 100 mg by mouth daily. Per PCP   . magnesium oxide (MAG-OX) 400 (241.3 Mg) MG tablet Take 2 tablets by mouth at bedtime.   [name]  metoprolol succinate (TOPROL-XL) 25 MG 24 hr tablet Take 1 tablet (25 mg total) by mouth daily. (Patient taking differently: Take 12.5 mg by mouth daily. )  . Multiple Vitamins-Minerals (PRESERVISION AREDS 2) CAPS Take 1 capsule by mouth daily.  . predniSONE (DELTASONE) 5 MG tablet Take 5 mg by mouth 3 (three) times a week.      Allergies:   Codeine and Penicillins   Social History   Socioeconomic History  . Marital status: Married    Spouse name: Not on file  . Number of children: 2  . Years of education: Not on file  . Highest education level: Not on file  Occupational History  . Not on file  Tobacco Use  . Smoking status: Former Smoker    Quit date: 08/31/1966    Years since  quitting: 52.4  . Smokeless tobacco: Never Used  Substance and Sexual Activity  . Alcohol use: No  . Drug use: No  . Sexual activity: Not on file  Other Topics Concern  . Not on file  Social History Narrative   epworth sleepiness scale = 8 (08/31/15)   Social Determinants of Health   Financial Resource Strain:   . Difficulty of Paying Living Expenses: Not on file  Food Insecurity:   . Worried About Charity fundraiser in the Last Year: Not on file  . Ran Out of Food in the Last Year: Not on file  Transportation Needs:   . Lack of Transportation (Medical): Not on file  . Lack of Transportation (Non-Medical): Not on file  Physical Activity:   . Days of Exercise per Week: Not on file  . Minutes of Exercise per Session: Not on file  Stress:   . Feeling of Stress : Not on file  Social Connections:   . Frequency of Communication with Friends and Family: Not on file  . Frequency of Social Gatherings with Friends and Family: Not on file  . Attends Religious Services: Not on file  . Active Member of Clubs or Organizations: Not on file  . Attends Archivist Meetings: Not on file  . Marital Status: Not on file     Family History: The patient's family history includes Breast cancer in her daughter; Heart disease in her sister and sister; Kidney failure in her father; Stroke in her mother.  ROS:   Please see the history of present illness.     All other systems reviewed and are negative.  EKGs/Labs/Other Studies Reviewed:    The following studies were reviewed today: Echo 09/13/2018  EKG:  EKG is not ordered today.  The ekg ordered 12/28/2018 demonstrates NSR- HR 55, sinus arrhythmia.   Recent Labs: 12/15/2018: ALT 29; Platelets 262; TSH 2.340 12/21/2018: BUN 22; Creatinine, Ser 1.60; Hemoglobin 13.6; Potassium 4.5; Sodium 140  Recent Lipid Panel    Component Value Date/Time   CHOL  07/04/2009 0418    81        ATP III CLASSIFICATION:  <200     mg/dL   Desirable   200-239  mg/dL   Borderline High  >=240    mg/dL   High          TRIG 64 07/04/2009 0418   HDL 18 (L) 07/04/2009 0418   CHOLHDL 4.5 07/04/2009 0418   VLDL 13 07/04/2009 0418   LDLCALC  07/04/2009 0418    50        Total Cholesterol/HDL:CHD Risk Coronary Heart Disease Risk Table  Men   Women  1/2 Average Risk   3.4   3.3  Average Risk       5.0   4.4  2 X Average Risk   9.6   7.1  3 X Average Risk  23.4   11.0        Use the calculated Patient Ratio above and the CHD Risk Table to determine the patient's CHD Risk.        ATP III CLASSIFICATION (LDL):  <100     mg/dL   Optimal  100-129  mg/dL   Near or Above                    Optimal  130-159  mg/dL   Borderline  160-189  mg/dL   High  >190     mg/dL   Very High    Physical Exam:    VS:  BP (!) 166/70   Pulse 70   Temp (!) 97.4 F (36.3 C)   Ht 5' 5.5" (1.664 m)   Wt 148 lb 6.4 oz (67.3 kg)   SpO2 91%   BMI 24.32 kg/m     Wt Readings from Last 3 Encounters:  01/19/19 148 lb 6.4 oz (67.3 kg)  12/28/18 153 lb 6.4 oz (69.6 kg)  12/15/18 154 lb 9.6 oz (70.1 kg)     GEN:  Well nourished, well developed in no acute distress HEENT: Normal NECK: No JVD; No carotid bruits CARDIAC: RRR, no murmurs, rubs, gallops, extra systole noted RESPIRATORY:  Clear to auscultation without rales, wheezing or rhonchi  ABDOMEN: Soft, non-tender, non-distended MUSCULOSKELETAL:  1+ LE edema; No deformity  SKIN: Warm and dry NEUROLOGIC:  Alert and oriented x 3 PSYCHIATRIC:  Normal affect   ASSESSMENT:    LE edema- I suspect this more likely from Amlodipine though its possible there is a component of diastolic CHF as well.   PAF- NSR on exam- s/p OP DCCV 12/21/2018  Chronic anticoagulation- On low dose Eliquis  CRI- SCr 1.6-1.9.  GFR 25. She has never seen a nephrologist but at 21 I doubt she would be a candidate for aggressive Rx.   HTN- Repeat B/P by me 162/84.  PLAN:    I suggested we increase her  Lasix to 20 mg daily and decrease her Amlodipine to 5 mg daily.  I think we're going to have to accept some degree of permissive hypertension.  I'll see her virtually in 3-4 weeks in follow up.    Medication Adjustments/Labs and Tests Ordered: Current medicines are reviewed at length with the patient today.  Concerns regarding medicines are outlined above.  No orders of the defined types were placed in this encounter.  No orders of the defined types were placed in this encounter.   There are no Patient Instructions on file for this visit.   Signed, [name], PA-C  01/19/2019 3:36 PM    [facility]

## 2019-01-30 ENCOUNTER — Ambulatory Visit: Payer: Medicare Other | Attending: Internal Medicine

## 2019-01-30 DIAGNOSIS — Z23 Encounter for immunization: Secondary | ICD-10-CM | POA: Insufficient documentation

## 2019-01-30 NOTE — Progress Notes (Signed)
   Covid-19 Vaccination Clinic  Name:  Allison Norman    MRN: [id number] DOB: 06-09-1924  01/30/2019  [name] was observed post Covid-19 immunization for 15 minutes without incidence. She was provided with Vaccine Information Sheet and instruction to access the V-Safe system.   [name] was instructed to call 911 with any severe reactions post vaccine: [name] Difficulty breathing  . Swelling of your face and throat  . A fast heartbeat  . A bad rash all over your body  . Dizziness and weakness    Immunizations Administered    Name Date Dose VIS Date Route   Pfizer COVID-19 Vaccine 01/30/2019 10:26 AM 0.3 mL 12/17/2018 Intramuscular   Manufacturer: [place]   Lot: [id number]   [place]: [id number]

## 2019-02-07 ENCOUNTER — Encounter: Payer: Self-pay | Admitting: Cardiology

## 2019-02-07 ENCOUNTER — Telehealth (INDEPENDENT_AMBULATORY_CARE_PROVIDER_SITE_OTHER): Payer: Medicare Other | Admitting: Cardiology

## 2019-02-07 ENCOUNTER — Telehealth: Payer: Self-pay

## 2019-02-07 VITALS — BP 173/69 | HR 62 | Ht 65.5 in | Wt 145.0 lb

## 2019-02-07 DIAGNOSIS — I10 Essential (primary) hypertension: Secondary | ICD-10-CM

## 2019-02-07 DIAGNOSIS — I480 Paroxysmal atrial fibrillation: Secondary | ICD-10-CM

## 2019-02-07 DIAGNOSIS — R600 Localized edema: Secondary | ICD-10-CM

## 2019-02-07 DIAGNOSIS — N184 Chronic kidney disease, stage 4 (severe): Secondary | ICD-10-CM

## 2019-02-07 DIAGNOSIS — Z7901 Long term (current) use of anticoagulants: Secondary | ICD-10-CM

## 2019-02-07 MED ORDER — AMLODIPINE BESYLATE 10 MG PO TABS: 10.0000 mg | ORAL_TABLET | Freq: Every day | ORAL | 1 refills | Status: DC

## 2019-02-07 NOTE — Addendum Note (Signed)
Addended by: Ulice Brilliant T on: 02/07/2019 02:36 PM   Modules accepted: Orders

## 2019-02-07 NOTE — Telephone Encounter (Signed)

## 2019-02-07 NOTE — Progress Notes (Signed)
Virtual Visit via Telephone Note   This visit type was conducted due to national recommendations for restrictions regarding the COVID-19 Pandemic (e.g. social distancing) in an effort to limit this patient's exposure and mitigate transmission in our community.  Due to her co-morbid illnesses, this patient is at least at moderate risk for complications without adequate follow up.  This format is felt to be most appropriate for this patient at this time.  The patient did not have access to video technology/had technical difficulties with video requiring transitioning to audio format only (telephone).  All issues noted in this document were discussed and addressed.  No physical exam could be performed with this format.  Please refer to the patient's chart for her  consent to telehealth for [facility].   Date:  02/07/2019   ID:  Allison Norman, DOB [birth year], MRN [id number]  Patient Location: Home Provider Location: Home  PCP:  [name], MD  Cardiologist:  Pixie Casino, MD  Electrophysiologist:  None   Evaluation Performed:  Follow-Up Visit  Chief Complaint:  LE edema, elevated B/P  History of Present Illness:    Allison Norman is a pleasant 86 y.o. female with a hx of PAF on chronic anticoagulation and s/p multiple DCCV, hypertension, and chronic renal insufficiency .  She was seen in the office in November 2020 and was in atrial fibrillation. It was decided to proceed with cardioversion, this was done 12/21/2018.  She has maintained sinus rhythm since.    She has had issues with hypertension.  She has also had uncomfortable lower extremity edema.  Because of her renal issues she had been told to take her diuretic only 3 times a week and to push fluids.  Because of her hypertension her amlodipine has been increased to 10 mg.   I saw her in the office 01/19/2019 and suggested she increase her Lasix to 20 mg daily and decrease her Amlodipine to 5 mg daily.  Initially this  helped with her edema but her B/P drifted back up and she went back to Amlodipine 10 mg.  As a result her edema has recurred.  She denies any unusual dyspnea or chest pain.   The patient does not have symptoms concerning for COVID-19 infection (fever, chills, cough, or new shortness of breath).    Past Medical History:  Diagnosis Date  . Atrial fibrillation ([place]) 2017   a. s/p DCCV in 10/2015  b. recurrent in 08/2016 --> rate-control pursued.   . Cancer ([place])    Breast  . CKD (chronic kidney disease)   . GCA (giant cell arteritis) ([place])   . Hypertension   . Macular degeneration   . Osteoporosis   . PMR (polymyalgia rheumatica) ([facility])   . Skin cancer    Past Surgical History:  Procedure Laterality Date  . CARDIOVERSION N/A 10/18/2015   Procedure: CARDIOVERSION;  Surgeon: Jerline Pain, MD;  Location: [facility];  Service: Cardiovascular;  Laterality: N/A;  . CARDIOVERSION N/A 02/20/2017   Procedure: CARDIOVERSION;  Surgeon: Pixie Casino, MD;  Location[facility] ENDOSCOPY;  Service: Cardiovascular;  Laterality: N/A;  . CARDIOVERSION N/A 06/18/2017   Procedure: CARDIOVERSION;  Surgeon: Sanda Klein, MD;  Location: [facility] ENDOSCOPY;  Service: Cardiovascular;  Laterality: N/A;  . CARDIOVERSION N/A 12/21/2018   Procedure: CARDIOVERSION;  Surgeon: Thayer Headings, MD;  Location: [facility] ENDOSCOPY;  Service: Cardiovascular;  Laterality: N/A;  . KNEE SURGERY  2013  . MASTECTOMY  1998   left side  Current Meds  Medication Sig  . amiodarone (PACERONE) 200 MG tablet Take 1 tablet (200 mg total) by mouth daily.  [name] amLODipine (NORVASC) 5 MG tablet Take 1 tablet (5 mg total) by mouth daily. (Patient taking differently: Take 10 mg by mouth daily. )  . b complex vitamins tablet Take 1 tablet by mouth daily.  . Bevacizumab (AVASTIN IV) Eye injection every 6 weeks  . cloNIDine (CATAPRES) 0.2 MG tablet TAKE 1 TABLET BY MOUTH THREE TIMES DAILY  . diazepam (VALIUM) 2 MG tablet Take 2 mg by mouth at  bedtime as needed (for sleep).   [name] diltiazem (CARDIZEM) 30 MG tablet Take 30 mg by mouth 2 (two) times daily as needed (as needed for heart rate greater than 100).  [name] ELIQUIS 2.5 MG TABS tablet Take 1 tablet by mouth twice daily  . furosemide (LASIX) 20 MG tablet Take 1 tablet (20 mg total) by mouth daily.  [name] losartan (COZAAR) 100 MG tablet Take 100 mg by mouth daily. Per PCP   . magnesium oxide (MAG-OX) 400 (241.3 Mg) MG tablet Take 2 tablets by mouth at bedtime.   . metoprolol succinate (TOPROL-XL) 25 MG 24 hr tablet Take 1 tablet (25 mg total) by mouth daily. (Patient taking differently: Take 12.5 mg by mouth daily. )  . Multiple Vitamins-Minerals (PRESERVISION AREDS 2) CAPS Take 1 capsule by mouth daily.  . predniSONE (DELTASONE) 5 MG tablet Take 5 mg by mouth 3 (three) times a week.      Allergies:   Codeine and Penicillins   Social History   Tobacco Use  . Smoking status: Former Smoker    Quit date: 08/31/1966    Years since quitting: 52.4  . Smokeless tobacco: Never Used  Substance Use Topics  . Alcohol use: No  . Drug use: No     Family Hx: The patient's family history includes Breast cancer in her daughter; Heart disease in her sister and sister; Kidney failure in her father; Stroke in her mother.  ROS:   Please see the history of present illness.    All other systems reviewed and are negative.   Prior CV studies:   The following studies were reviewed today: Echo Sept 2017  Labs/Other Tests and Data Reviewed:    EKG:  No ECG reviewed.  Recent Labs: 12/15/2018: ALT 29; Platelets 262; TSH 2.340 12/21/2018: BUN 22; Creatinine, Ser 1.60; Hemoglobin 13.6; Potassium 4.5; Sodium 140   Recent Lipid Panel Lab Results  Component Value Date/Time   CHOL  07/04/2009 04:18 AM    81        ATP III CLASSIFICATION:  <200     mg/dL   Desirable  200-239  mg/dL   Borderline High  >=240    mg/dL   High          TRIG 64 07/04/2009 04:18 AM   HDL 18 (L) 07/04/2009 04:18 AM    CHOLHDL 4.5 07/04/2009 04:18 AM   LDLCALC  07/04/2009 04:18 AM    50        Total Cholesterol/HDL:CHD Risk Coronary Heart Disease Risk Table                     Men   Women  1/2 Average Risk   3.4   3.3  Average Risk       5.0   4.4  2 X Average Risk   9.6   7.1  3 X Average Risk  23.4   11.0  Use the calculated Patient Ratio above and the CHD Risk Table to determine the patient's CHD Risk.        ATP III CLASSIFICATION (LDL):  <100     mg/dL   Optimal  100-129  mg/dL   Near or Above                    Optimal  130-159  mg/dL   Borderline  160-189  mg/dL   High  >190     mg/dL   Very High    Wt Readings from Last 3 Encounters:  02/07/19 145 lb (65.8 kg)  01/19/19 148 lb 6.4 oz (67.3 kg)  12/28/18 153 lb 6.4 oz (69.6 kg)     Objective:    Vital Signs:  BP (!) 173/69   Pulse 62   Ht 5' 5.5" (1.664 m)   Wt 145 lb (65.8 kg)   BMI 23.76 kg/m    VITAL SIGNS:  reviewed  ASSESSMENT & PLAN:    LE edema- I suspect this more likely from Amlodipine though its possible there is a component of diastolic CHF as well.   PAF- NSR on exam- s/p OP DCCV 12/21/2018  Chronic anticoagulation- On low dose Eliquis  CRI- SCr 1.6-1.9.  GFR 25. She has never seen a nephrologist but at 78 I doubt she would be a candidate for aggressive Rx.   HTN- Poor control. She is already on Clonidine TID, losartan 100 mg, and Amlodipine which is contributing to her LE edema.  She can't tolerate more beta blocker (HR 50's to 60's), and I'm hesitant to push her diuretics.   COVID-19 Education: The signs and symptoms of COVID-19 were discussed with the patient and how to seek care for testing (follow up with PCP or arrange E-visit).  The importance of social distancing was discussed today.  Time:   Today, I have spent 15 minutes with the patient with telehealth technology discussing the above problems.     Medication Adjustments/Labs and Tests Ordered: Current medicines are reviewed at  length with the patient today.  Concerns regarding medicines are outlined above.   Tests Ordered: No orders of the defined types were placed in this encounter.   Medication Changes: No orders of the defined types were placed in this encounter.   Follow Up:  In Person Dr Oval Linsey in the hypertension clinic- check BMP before that visit.  Signed, [name], PA-C  02/07/2019 [facility]

## 2019-02-07 NOTE — Patient Instructions (Addendum)
Medication Instructions:  Its ok for you to take 10mg  of Norvasc (Amlodipine) *If you need a refill on your cardiac medications before your next appointment, please call your pharmacy*  Lab Work: Your physician recommends that you return for lab work in: [place] If you have labs (blood work) drawn today and your tests are completely normal, you will receive your results only by: [name] MyChart Message (if you have MyChart) OR . A paper copy in the mail If you have any lab test that is abnormal or we need to change your treatment, we will call you to review the results.  Testing/Procedures: NONE   Follow-Up: At [facility], you and your health needs are our priority.  As part of our continuing mission to provide you with exceptional heart care, we have created designated Provider Care Teams.  These Care Teams include your primary Cardiologist (physician) and Advanced Practice Providers (APPs -  Physician Assistants and Nurse Practitioners) who all work together to provide you with the care you need, when you need it.  Your next appointment:   NEEDS APPT WITH HYPERTENSION CLINIC  The format for your next appointment:   In Person  Provider:   Skeet Latch, MD  Other Instructions

## 2019-02-07 NOTE — Telephone Encounter (Addendum)
Contacted patient to discuss AVS Instructions. Gave patient Luke's recommendations from today's virtual office visit. Informed patient that someone from the scheduling dept will be in contact with them to schedule their follow up appt. Patient voiced understanding; AVS printed and mailed to patient.    

## 2019-02-09 ENCOUNTER — Other Ambulatory Visit: Payer: Self-pay | Admitting: Internal Medicine

## 2019-02-16 DIAGNOSIS — R600 Localized edema: Secondary | ICD-10-CM | POA: Diagnosis not present

## 2019-02-16 DIAGNOSIS — Z7901 Long term (current) use of anticoagulants: Secondary | ICD-10-CM | POA: Diagnosis not present

## 2019-02-16 DIAGNOSIS — N184 Chronic kidney disease, stage 4 (severe): Secondary | ICD-10-CM | POA: Diagnosis not present

## 2019-02-16 DIAGNOSIS — I480 Paroxysmal atrial fibrillation: Secondary | ICD-10-CM | POA: Diagnosis not present

## 2019-02-16 DIAGNOSIS — I10 Essential (primary) hypertension: Secondary | ICD-10-CM | POA: Diagnosis not present

## 2019-02-17 LAB — BASIC METABOLIC PANEL
BUN/Creatinine Ratio: 15 (ref 12–28)
BUN: 30 mg/dL (ref 10–36)
CO2: 20 mmol/L (ref 20–29)
Calcium: 8.9 mg/dL (ref 8.7–10.3)
Chloride: 105 mmol/L (ref 96–106)
Creatinine, Ser: 1.95 mg/dL — ABNORMAL HIGH (ref 0.57–1.00)
GFR calc Af Amer: 25 mL/min/{1.73_m2} — ABNORMAL LOW (ref >59)
GFR calc non Af Amer: 22 mL/min/{1.73_m2} — ABNORMAL LOW (ref >59)
Glucose: 130 mg/dL — ABNORMAL HIGH (ref 65–99)
Potassium: 4.4 mmol/L (ref 3.5–5.2)
Sodium: 143 mmol/L (ref 134–144)

## 2019-02-21 ENCOUNTER — Ambulatory Visit: Payer: Medicare Other | Attending: Internal Medicine

## 2019-02-21 DIAGNOSIS — Z23 Encounter for immunization: Secondary | ICD-10-CM | POA: Insufficient documentation

## 2019-02-21 NOTE — Progress Notes (Signed)
   Covid-19 Vaccination Clinic  Name:  Allison Norman    MRN: [id number] DOB: [birth year]  02/21/2019  Allison Norman was observed post Covid-19 immunization for 15 minutes without incidence. She was provided with Vaccine Information Sheet and instruction to access the V-Safe system.   Allison Norman was instructed to call 911 with any severe reactions post vaccine: [name] Difficulty breathing  . Swelling of your face and throat  . A fast heartbeat  . A bad rash all over your body  . Dizziness and weakness    Immunizations Administered    Name Date Dose VIS Date Route   Pfizer COVID-19 Vaccine 02/21/2019  2:31 PM 0.3 mL 12/17/2018 Intramuscular   Manufacturer: [place]   Lot: [id number]   [place]: [address]-3

## 2019-03-03 ENCOUNTER — Other Ambulatory Visit: Payer: Self-pay

## 2019-03-03 ENCOUNTER — Ambulatory Visit (INDEPENDENT_AMBULATORY_CARE_PROVIDER_SITE_OTHER): Payer: Medicare Other | Admitting: Cardiovascular Disease

## 2019-03-03 VITALS — BP 172/78 | HR 51 | Temp 98.4°F | Ht 65.5 in | Wt 143.0 lb

## 2019-03-03 DIAGNOSIS — R011 Cardiac murmur, unspecified: Secondary | ICD-10-CM | POA: Diagnosis not present

## 2019-03-03 DIAGNOSIS — I10 Essential (primary) hypertension: Secondary | ICD-10-CM

## 2019-03-03 DIAGNOSIS — N184 Chronic kidney disease, stage 4 (severe): Secondary | ICD-10-CM | POA: Diagnosis not present

## 2019-03-03 DIAGNOSIS — Z853 Personal history of malignant neoplasm of breast: Secondary | ICD-10-CM | POA: Diagnosis not present

## 2019-03-03 DIAGNOSIS — R0602 Shortness of breath: Secondary | ICD-10-CM | POA: Diagnosis not present

## 2019-03-03 DIAGNOSIS — R600 Localized edema: Secondary | ICD-10-CM

## 2019-03-03 DIAGNOSIS — D649 Anemia, unspecified: Secondary | ICD-10-CM | POA: Diagnosis not present

## 2019-03-03 DIAGNOSIS — I4891 Unspecified atrial fibrillation: Secondary | ICD-10-CM | POA: Diagnosis not present

## 2019-03-03 DIAGNOSIS — M179 Osteoarthritis of knee, unspecified: Secondary | ICD-10-CM | POA: Diagnosis not present

## 2019-03-03 DIAGNOSIS — E781 Pure hyperglyceridemia: Secondary | ICD-10-CM | POA: Diagnosis not present

## 2019-03-03 DIAGNOSIS — N183 Chronic kidney disease, stage 3 unspecified: Secondary | ICD-10-CM | POA: Diagnosis not present

## 2019-03-03 MED ORDER — HYDRALAZINE HCL 50 MG PO TABS: 50.0000 mg | ORAL_TABLET | Freq: Three times a day (TID) | ORAL | 3 refills | Status: DC

## 2019-03-03 MED ORDER — AMLODIPINE BESYLATE 5 MG PO TABS: 5.0000 mg | ORAL_TABLET | Freq: Every day | ORAL | 3 refills | Status: DC

## 2019-03-03 NOTE — Patient Instructions (Addendum)
Medication Instructions:  DECREASE YOUR AMLODIPINE TO 5 MG DAILY   START HYDRALAZINE 50 MG THREE TIMES A DAY    Labwork: NONE   Testing/Procedures: Your physician has requested that you have an echocardiogram. Echocardiography is a painless test that uses sound waves to create images of your heart. It provides your doctor with information about the size and shape of your heart and how well your heart's chambers and valves are working. This procedure takes approximately one hour. There are no restrictions for this procedure. [place] [address]  Your physician has requested that you have a renal artery duplex. During this test, an ultrasound is used to evaluate blood flow to the kidneys. Allow one hour for this exam. Do not eat after midnight the day before and avoid carbonated beverages. Take your medications as you usually do.   Follow-Up: Your physician recommends that you schedule a follow-up appointment in: VIRTUAL WITH PHARM D 03/31/2019 at 10:30  Special Instructions:   MONITOR AND LOG YOUR BLOOD, TRY TO DO TWICE A DAY. HAVE THE BOOK WITH READINGS AVAILABLE AT FOLLOW UP   If you have questions regarding your blood pressure or any medication changes from Monday through Friday, 8 AM through 5 PM please call Vanita Ingles, RN [phone]) or Winifred Olive, RN [phone]).  If you are in need of emergency care after hours, please call 911 or be seen in the emergency room  DASH Eating Plan [place] stands for "Dietary Approaches to Stop Hypertension." The DASH eating plan is a healthy eating plan that has been shown to reduce high blood pressure (hypertension). It may also reduce your risk for type 2 diabetes, heart disease, and stroke. The DASH eating plan may also help with weight loss. What are tips for following this plan?  General guidelines  Avoid eating more than 2,300 mg (milligrams) of salt (sodium) a day. If you have hypertension, you may need to reduce  your sodium intake to 1,500 mg a day.  Limit alcohol intake to no more than 1 drink a day for nonpregnant women and 2 drinks a day for men. One drink equals 12 oz of beer, 5 oz of wine, or 1 oz of hard liquor.  Work with your health care provider to maintain a healthy body weight or to lose weight. Ask what an ideal weight is for you.  Get at least 30 minutes of exercise that causes your heart to beat faster (aerobic exercise) most days of the week. Activities may include walking, swimming, or biking.  Work with your health care provider or diet and nutrition specialist (dietitian) to adjust your eating plan to your individual calorie needs. Reading food labels   Check food labels for the amount of sodium per serving. Choose foods with less than 5 percent of the Daily Value of sodium. Generally, foods with less than 300 mg of sodium per serving fit into this eating plan.  To find whole grains, look for the word "whole" as the first word in the ingredient list. Shopping  Buy products labeled as "low-sodium" or "no salt added."  Buy fresh foods. Avoid canned foods and premade or frozen meals. Cooking  Avoid adding salt when cooking. Use salt-free seasonings or herbs instead of table salt or sea salt. Check with your health care provider or pharmacist before using salt substitutes.  Do not fry foods. Cook foods using healthy methods such as baking, boiling, grilling, and broiling instead.  Cook with heart-healthy oils,  such as olive, canola, soybean, or sunflower oil. Meal planning  Eat a balanced diet that includes: ? 5 or more servings of fruits and vegetables each day. At each meal, try to fill half of your plate with fruits and vegetables. ? Up to 6-8 servings of whole grains each day. ? Less than 6 oz of lean meat, poultry, or fish each day. A 3-oz serving of meat is about the same size as a deck of cards. One egg equals 1 oz. ? 2 servings of low-fat dairy each day. ? A serving  of nuts, seeds, or beans 5 times each week. ? Heart-healthy fats. Healthy fats called Omega-3 fatty acids are found in foods such as flaxseeds and coldwater fish, like sardines, salmon, and mackerel.  Limit how much you eat of the following: ? Canned or prepackaged foods. ? Food that is high in trans fat, such as fried foods. ? Food that is high in saturated fat, such as fatty meat. ? Sweets, desserts, sugary drinks, and other foods with added sugar. ? Full-fat dairy products.  Do not salt foods before eating.  Try to eat at least 2 vegetarian meals each week.  Eat more home-cooked food and less restaurant, buffet, and fast food.  When eating at a restaurant, ask that your food be prepared with less salt or no salt, if possible. What foods are recommended? The items listed may not be a complete list. Talk with your dietitian about what dietary choices are best for you. Grains Whole-grain or whole-wheat bread. Whole-grain or whole-wheat pasta. Brown rice. [place]. Bulgur. Whole-grain and low-sodium cereals. Pita bread. Low-fat, low-sodium crackers. Whole-wheat flour tortillas. Vegetables Fresh or frozen vegetables (raw, steamed, roasted, or grilled). Low-sodium or reduced-sodium tomato and vegetable juice. Low-sodium or reduced-sodium tomato sauce and tomato paste. Low-sodium or reduced-sodium canned vegetables. Fruits All fresh, dried, or frozen fruit. Canned fruit in natural juice (without added sugar). Meat and other protein foods Skinless chicken or [place]. Ground chicken or [place]. Pork with fat trimmed off. Fish and seafood. Egg whites. Dried beans, peas, or lentils. Unsalted nuts, nut butters, and seeds. Unsalted canned beans. Lean cuts of beef with fat trimmed off. Low-sodium, lean deli meat. Dairy Low-fat (1%) or fat-free (skim) milk. Fat-free, low-fat, or reduced-fat cheeses. Nonfat, low-sodium ricotta or cottage cheese. Low-fat or nonfat yogurt. Low-fat, low-sodium  cheese. Fats and oils Soft margarine without trans fats. Vegetable oil. Low-fat, reduced-fat, or light mayonnaise and salad dressings (reduced-sodium). Canola, safflower, olive, soybean, and sunflower oils. Avocado. Seasoning and other foods Herbs. Spices. Seasoning mixes without salt. Unsalted popcorn and pretzels. Fat-free sweets. What foods are not recommended? The items listed may not be a complete list. Talk with your dietitian about what dietary choices are best for you. Grains Baked goods made with fat, such as croissants, muffins, or some breads. Dry pasta or rice meal packs. Vegetables Creamed or fried vegetables. Vegetables in a cheese sauce. Regular canned vegetables (not low-sodium or reduced-sodium). Regular canned tomato sauce and paste (not low-sodium or reduced-sodium). Regular tomato and vegetable juice (not low-sodium or reduced-sodium). Angie Fava. Olives. Fruits Canned fruit in a light or heavy syrup. Fried fruit. Fruit in cream or butter sauce. Meat and other protein foods Fatty cuts of meat. Ribs. Fried meat. Berniece Salines. Sausage. Bologna and other processed lunch meats. Salami. Fatback. Hotdogs. Bratwurst. Salted nuts and seeds. Canned beans with added salt. Canned or smoked fish. Whole eggs or egg yolks. Chicken or [place] with skin. Dairy Whole or 2% milk, cream,  and half-and-half. Whole or full-fat cream cheese. Whole-fat or sweetened yogurt. Full-fat cheese. Nondairy creamers. Whipped toppings. Processed cheese and cheese spreads. Fats and oils Butter. Stick margarine. Lard. Shortening. Ghee. Bacon fat. Tropical oils, such as coconut, palm kernel, or palm oil. Seasoning and other foods Salted popcorn and pretzels. Onion salt, garlic salt, seasoned salt, table salt, and sea salt. Worcestershire sauce. Tartar sauce. Barbecue sauce. Teriyaki sauce. Soy sauce, including reduced-sodium. Steak sauce. Canned and packaged gravies. Fish sauce. Oyster sauce. Cocktail sauce. Horseradish  that you find on the shelf. Ketchup. Mustard. Meat flavorings and tenderizers. Bouillon cubes. Hot sauce and Tabasco sauce. Premade or packaged marinades. Premade or packaged taco seasonings. Relishes. Regular salad dressings. Where to find more information:  National Heart, Lung, and [place]: https://wilson-eaton.com/  American Heart Association: www.heart.org Summary  The DASH eating plan is a healthy eating plan that has been shown to reduce high blood pressure (hypertension). It may also reduce your risk for type 2 diabetes, heart disease, and stroke.  With the DASH eating plan, you should limit salt (sodium) intake to 2,300 mg a day. If you have hypertension, you may need to reduce your sodium intake to 1,500 mg a day.  When on the DASH eating plan, aim to eat more fresh fruits and vegetables, whole grains, lean proteins, low-fat dairy, and heart-healthy fats.  Work with your health care provider or diet and nutrition specialist (dietitian) to adjust your eating plan to your individual calorie needs. This information is not intended to replace advice given to you by your health care provider. Make sure you discuss any questions you have with your health care provider. Document Released: 12/12/2010 Document Revised: 12/05/2016 Document Reviewed: 12/17/2015 Elsevier Patient Education  2020 [employer].

## 2019-03-03 NOTE — Progress Notes (Signed)
Hypertension Clinic Initial Assessment:    Date:  03/03/2019   ID:  Allison Norman, DOB Aug 30, 1924, MRN [id number]  PCP:  [name], MD  Cardiologist:  Pixie Casino, MD  Nephrologist:  Referring MD: [name], MD   CC: Hypertension  History of Present Illness:    Allison Norman is a 86 y.o. female with a hx of paroxysmal atrial fibrillation, hypertension, and CKD IV here to establish care in the hypertension clinic.  She reports that she was diagnosed with hypertension several years ago.  Prior to developing atrial fibrillation her BP was better controlled.  Her medications were changed for improved rate control.  She has been working with [name], PA-C for her hypertension lately.  Amlodipine was increased to 10mg  but she developed LE edema, so amlodipine was reduced back to 5mg  and lasix was started.  Her edema improved but BP worsened, so she increased back to 10mg .  Lately her SBP has been in the 160s and her heart rate has been in the 50s.  She has very little energy and isn't getting much exercise.  She used to walk more and hopes to start back exercising this week. She did PT two years ago and this was helpful. She is fearful of falling again. She denies lightheadedness or dizziness.  She has mild edema but no orthopnea or PND.  She mostly eats at home but struggles with adding salt to her food.  She notes that she sometimes forgets to take her afternoon dose of clonidine.    Medication Schedule:  9 AM:  Clonidine, hydralazine 11 AM: amlodipine, losartan, lasix, metoprolol 5 PM: Clonidine 9 PM: Clonidine, hydralazine  Previous antihypertensives: Amlodipine- edema at 10 mg    Past Medical History:  Diagnosis Date  . Atrial fibrillation ([facility]) 2017   a. s/p DCCV in 10/2015  b. recurrent in 08/2016 --> rate-control pursued.   . Cancer ([place])    Breast  . CKD (chronic kidney disease)   . GCA (giant cell arteritis) ([place])   . Hypertension   . Macular  degeneration   . Osteoporosis   . PMR (polymyalgia rheumatica) ([facility])   . Skin cancer     Past Surgical History:  Procedure Laterality Date  . CARDIOVERSION N/A 10/18/2015   Procedure: CARDIOVERSION;  Surgeon: Jerline Pain, MD;  Location: [facility];  Service: Cardiovascular;  Laterality: N/A;  . CARDIOVERSION N/A 02/20/2017   Procedure: CARDIOVERSION;  Surgeon: Pixie Casino, MD;  Location: [facility] ENDOSCOPY;  Service: Cardiovascular;  Laterality: N/A;  . CARDIOVERSION N/A 06/18/2017   Procedure: CARDIOVERSION;  Surgeon: Sanda Klein, MD;  Location: [facility] ENDOSCOPY;  Service: Cardiovascular;  Laterality: N/A;  . CARDIOVERSION N/A 12/21/2018   Procedure: CARDIOVERSION;  Surgeon: Thayer Headings, MD;  Location: [facility] ENDOSCOPY;  Service: Cardiovascular;  Laterality: N/A;  . KNEE SURGERY  2013  . MASTECTOMY  1998   left side    Current Medications: Current Meds  Medication Sig  . amiodarone (PACERONE) 200 MG tablet Take 1 tablet (200 mg total) by mouth daily.  [name] amLODipine (NORVASC) 10 MG tablet Take 1 tablet (10 mg total) by mouth daily.  [name] b complex vitamins tablet Take 1 tablet by mouth daily.  . Bevacizumab (AVASTIN IV) Eye injection every 6 weeks  . cloNIDine (CATAPRES) 0.2 MG tablet Take 1 tablet (0.2 mg total) by mouth 3 (three) times daily.  . diazepam (VALIUM) 2 MG tablet Take 2 mg by mouth at bedtime as  needed (for sleep).   [name] diltiazem (CARDIZEM) 30 MG tablet Take 30 mg by mouth 2 (two) times daily as needed (as needed for heart rate greater than 100).  [name] ELIQUIS 2.5 MG TABS tablet Take 1 tablet by mouth twice daily  . furosemide (LASIX) 20 MG tablet Take 1 tablet (20 mg total) by mouth daily.  [name] losartan (COZAAR) 100 MG tablet Take 100 mg by mouth daily. Per PCP   . magnesium oxide (MAG-OX) 400 (241.3 Mg) MG tablet Take 2 tablets by mouth at bedtime.   . metoprolol succinate (TOPROL-XL) 25 MG 24 hr tablet Take 1 tablet (25 mg total) by mouth daily. (Patient taking  differently: Take 12.5 mg by mouth daily. )  . Multiple Vitamins-Minerals (PRESERVISION AREDS 2) CAPS Take 1 capsule by mouth daily.  . predniSONE (DELTASONE) 5 MG tablet Take 5 mg by mouth 3 (three) times a week.      Allergies:   Codeine and Penicillins   Social History   Socioeconomic History  . Marital status: Married    Spouse name: Not on file  . Number of children: 2  . Years of education: Not on file  . Highest education level: Not on file  Occupational History  . Not on file  Tobacco Use  . Smoking status: Former Smoker    Quit date: 08/31/1966    Years since quitting: 52.5  . Smokeless tobacco: Never Used  Substance and Sexual Activity  . Alcohol use: No  . Drug use: No  . Sexual activity: Not on file  Other Topics Concern  . Not on file  Social History Narrative   epworth sleepiness scale = 8 (08/31/15)   Social Determinants of Health   Financial Resource Strain:   . Difficulty of Paying Living Expenses: Not on file  Food Insecurity:   . Worried About Charity fundraiser in the Last Year: Not on file  . Ran Out of Food in the Last Year: Not on file  Transportation Needs:   . Lack of Transportation (Medical): Not on file  . Lack of Transportation (Non-Medical): Not on file  Physical Activity:   . Days of Exercise per Week: Not on file  . Minutes of Exercise per Session: Not on file  Stress:   . Feeling of Stress : Not on file  Social Connections:   . Frequency of Communication with Friends and Family: Not on file  . Frequency of Social Gatherings with Friends and Family: Not on file  . Attends Religious Services: Not on file  . Active Member of Clubs or Organizations: Not on file  . Attends Archivist Meetings: Not on file  . Marital Status: Not on file     Family History: The patient's family history includes Breast cancer in her daughter; Heart disease in her sister and sister; Kidney failure in her father; Stroke in her mother.  ROS:     Please see the history of present illness.     All other systems reviewed and are negative.  EKGs/Labs/Other Studies Reviewed:    EKG:  EKG is not ordered today.  Recent Labs: 12/15/2018: ALT 29; Platelets 262; TSH 2.340 12/21/2018: Hemoglobin 13.6 02/16/2019: BUN 30; Creatinine, Ser 1.95; Potassium 4.4; Sodium 143   Recent Lipid Panel    Component Value Date/Time   CHOL  07/04/2009 0418    81        ATP III CLASSIFICATION:  <200     mg/dL   Desirable  200-239  mg/dL   Borderline High  >=240    mg/dL   High          TRIG 64 07/04/2009 0418   HDL 18 (L) 07/04/2009 0418   CHOLHDL 4.5 07/04/2009 0418   VLDL 13 07/04/2009 0418   LDLCALC  07/04/2009 0418    50        Total Cholesterol/HDL:CHD Risk Coronary Heart Disease Risk Table                     Men   Women  1/2 Average Risk   3.4   3.3  Average Risk       5.0   4.4  2 X Average Risk   9.6   7.1  3 X Average Risk  23.4   11.0        Use the calculated Patient Ratio above and the CHD Risk Table to determine the patient's CHD Risk.        ATP III CLASSIFICATION (LDL):  <100     mg/dL   Optimal  100-129  mg/dL   Near or Above                    Optimal  130-159  mg/dL   Borderline  160-189  mg/dL   High  >190     mg/dL   Very High    Physical Exam:      Wt Readings from Last 3 Encounters:  03/03/19 143 lb (64.9 kg)  02/07/19 145 lb (65.8 kg)  01/19/19 148 lb 6.4 oz (67.3 kg)    VS:  BP (!) 172/78   Pulse (!) 51   Temp 98.4 F (36.9 C)   Ht 5' 5.5" (1.664 m)   Wt 143 lb (64.9 kg)   SpO2 92%   BMI 23.43 kg/m  , BMI Body mass index is 23.43 kg/m. GENERAL:  Well appearing HEENT: Pupils equal round and reactive, fundi not visualized, oral mucosa unremarkable NECK:  No jugular venous distention, waveform within normal limits, carotid upstroke brisk and symmetric, no bruits, no thyromegaly LYMPHATICS:  No cervical adenopathy LUNGS:  Clear to auscultation bilaterally HEART:  RRR.  PMI not displaced or  sustained,S1 and S2 within normal limits, no S3, no S4, no clicks, no rubs, no murmurs ABD:  Flat, positive bowel sounds normal in frequency in pitch, no bruits, no rebound, no guarding, no midline pulsatile mass, no hepatomegaly, no splenomegaly EXT:  2 plus pulses throughout, 1+ LE edema, no cyanosis no clubbing SKIN:  No rashes no nodules NEURO:  Cranial nerves II through XII grossly intact, motor grossly intact throughout PSYCH:  Cognitively intact, oriented to person place and time   ASSESSMENT:    No diagnosis found.  PLAN:    # Resistant hypertension: Blood pressure remains poorly controlled despite being on several medications.  We discussed limiting salt and following the DASH diet.  We will check renal artery Dopplers. She was encouraged to start back walking more with a walker/cane for stability.  Increase hydralazine to 50mg  tid.  Continue amlodipine 5 mg, clonidine, losartan and metoprolol.  Consider switching metoprolol to carvedilol for improved BP effect and less heart rate effect.  She will track her BP twice daily.    # LE Edema: # Shortness of breath: Check an echo.  Continue lasix.   Disposition:    FU with MD/PharmD in 1 month    Medication Adjustments/Labs and Tests Ordered: Current medicines are  reviewed at length with the patient today.  Concerns regarding medicines are outlined above.  No orders of the defined types were placed in this encounter.  No orders of the defined types were placed in this encounter.    Signed, Skeet Latch, MD  03/03/2019 2:18 PM    [facility]

## 2019-03-07 ENCOUNTER — Telehealth: Payer: Self-pay | Admitting: Cardiovascular Disease

## 2019-03-07 NOTE — Telephone Encounter (Signed)
OK.  Let's stop hydralazine. Start doxazosin 2mg  qhs.

## 2019-03-07 NOTE — Telephone Encounter (Signed)
Contacted patient- she states on 2/25 she was given a new medication to try (hydralazine)  and since starting it her BP has went down, but she can hear her 'pulse in her head', she states it is very loud and annoying and she no longer wants to take the medication- she states she will not take anymore of it.   She does not have a rash, no chest pain or other symptoms.   Advised I would route to MD to make aware.

## 2019-03-07 NOTE — Telephone Encounter (Signed)
Pt c/o medication issue:  1. Name of Medication: hydrALAZINE (APRESOLINE) 50 MG tablet  2. How are you currently taking this medication (dosage and times per day)? As directed  3. Are you having a reaction (difficulty breathing--STAT)? yes  4. What is your medication issue? Patient feels she may be having an allergic reaction.  Patient says she can feel her pulse in her ear.

## 2019-03-08 DIAGNOSIS — H353122 Nonexudative age-related macular degeneration, left eye, intermediate dry stage: Secondary | ICD-10-CM | POA: Diagnosis not present

## 2019-03-08 DIAGNOSIS — H35351 Cystoid macular degeneration, right eye: Secondary | ICD-10-CM | POA: Diagnosis not present

## 2019-03-08 DIAGNOSIS — H353211 Exudative age-related macular degeneration, right eye, with active choroidal neovascularization: Secondary | ICD-10-CM | POA: Diagnosis not present

## 2019-03-08 DIAGNOSIS — H353112 Nonexudative age-related macular degeneration, right eye, intermediate dry stage: Secondary | ICD-10-CM | POA: Diagnosis not present

## 2019-03-08 MED ORDER — DOXAZOSIN MESYLATE 2 MG PO TABS: 2.0000 mg | ORAL_TABLET | Freq: Every day | ORAL | 5 refills | Status: DC

## 2019-03-08 NOTE — Telephone Encounter (Signed)
Advised patient, verbalized understanding  

## 2019-03-09 ENCOUNTER — Other Ambulatory Visit: Payer: Self-pay | Admitting: Internal Medicine

## 2019-03-11 ENCOUNTER — Telehealth: Payer: Self-pay | Admitting: Cardiovascular Disease

## 2019-03-11 MED ORDER — METOPROLOL SUCCINATE ER 25 MG PO TB24: 12.5000 mg | ORAL_TABLET | Freq: Every day | ORAL | 3 refills | Status: DC

## 2019-03-11 NOTE — Telephone Encounter (Signed)
Unable to reach patient, line busy. Prescription refill for Metoprolol sent to pharmacy.

## 2019-03-11 NOTE — Telephone Encounter (Signed)
Pt c/o medication issue:  1. Name of Medication: metoprolol succinate (TOPROL-XL) 25 MG 24 hr tablet  2. How are you currently taking this medication (dosage and times per day)? As directed  3. Are you having a reaction (difficulty breathing--STAT)? no  4. What is your medication issue? Patient states that the pharmacy told her that our office would not refill this medication for her. Please advise.

## 2019-03-15 ENCOUNTER — Encounter: Payer: Self-pay | Admitting: Cardiovascular Disease

## 2019-03-15 DIAGNOSIS — I1A0 Resistant hypertension: Secondary | ICD-10-CM

## 2019-03-15 DIAGNOSIS — I10 Essential (primary) hypertension: Secondary | ICD-10-CM

## 2019-03-15 DIAGNOSIS — R0602 Shortness of breath: Secondary | ICD-10-CM

## 2019-03-15 HISTORY — DX: Essential (primary) hypertension: I10

## 2019-03-15 HISTORY — DX: Resistant hypertension: I1A.0

## 2019-03-15 HISTORY — DX: Shortness of breath: R06.02

## 2019-03-21 ENCOUNTER — Other Ambulatory Visit (HOSPITAL_COMMUNITY): Payer: Medicare Other

## 2019-03-23 ENCOUNTER — Ambulatory Visit (HOSPITAL_COMMUNITY)
Admission: RE | Admit: 2019-03-23 | Payer: Medicare Other | Source: Ambulatory Visit | Attending: Cardiovascular Disease | Admitting: Cardiovascular Disease

## 2019-03-31 ENCOUNTER — Encounter: Payer: Self-pay | Admitting: Pharmacist Clinician (PhC)/ Clinical Pharmacy Specialist

## 2019-03-31 ENCOUNTER — Telehealth (INDEPENDENT_AMBULATORY_CARE_PROVIDER_SITE_OTHER): Payer: Medicare Other | Admitting: Pharmacist Clinician (PhC)/ Clinical Pharmacy Specialist

## 2019-03-31 DIAGNOSIS — I10 Essential (primary) hypertension: Secondary | ICD-10-CM

## 2019-03-31 MED ORDER — CARVEDILOL 3.125 MG PO TABS: 3.1250 mg | ORAL_TABLET | Freq: Two times a day (BID) | ORAL | 3 refills | Status: DC

## 2019-03-31 NOTE — Progress Notes (Signed)
04/01/2019 Allison Norman [birth year] [id number]   HPI:  Allison Norman is a 86 y.o. female patient of Dr Oval Linsey, with a [place] below who presents today for advanced hypertension clinic virtual follow up.  In addition to hypertension her medical history is significant for atrial fibrillation with RVR and renal insufficiency.  She saw Dr. Oval Linsey last month and had an in office BP of 172/78.  She was given hydralazine 50 mg tid but stopped this within a week as it caused her to "hear her pulse in her head".  It was loud and annoying and patient discontinued medication.  She was then asked to start doxazosin 2 mg qhs.  She was scheduled for renal dopplers, but has had to reschedule that appointment and will get next week.  She is meeting with [place] today virtually for follow up.   She states to be doing well overall, but notes that since starting the doxazosin, she "barely" makes it to the bathroom when she gets up to urinate in the middle of the night.  She also complains that her heart rate continues to run high and feels that the diltiazem is not helping much.  When asked about compliance, she admits that she did miss the mid-day clonidine doses occasionally, but her granddaughter has helped her to organize her medications and she has not missed any doses for the last week or two.      Blood Pressure Goal:  130/80  Current Medications: metoprolol 12.5 mg qd, amlodipine 5 mg qd, clonidine 0.2 mg tid, doxazosin 2 mg qhs, losartan 100 mg qd, diltiazem 30 mg prn HR > 100  Family Hx: mother had stroke, father with CKD  Social Hx: no tobacco  Diet: tries to monitor sodium, but does use at table.  Mostly home cooked meals, does not eat out much  Home BP readings: no readings for [place], but does note heart rate is running wide range from 47-130  Intolerances: hydralazine - not willing to re-challenge with lower dose  Labs: 2/21:  Na 143, K 4.4, Glu 130, BUN 22, SCr 1.95  CrCl 18.9 GFR 22  Wt  Readings from Last 3 Encounters:  03/31/19 145 lb (65.8 kg)  03/03/19 143 lb (64.9 kg)  02/07/19 145 lb (65.8 kg)   BP Readings from Last 3 Encounters:  03/31/19 (!) 150/73  03/03/19 (!) 172/78  02/07/19 (!) 173/69   Pulse Readings from Last 3 Encounters:  03/31/19 (!) 127  03/03/19 (!) 51  02/07/19 62    Current Outpatient Medications  Medication Sig Dispense Refill  . amiodarone (PACERONE) 200 MG tablet Take 1 tablet (200 mg total) by mouth daily. 90 tablet 2  . amLODipine (NORVASC) 5 MG tablet Take 1 tablet (5 mg total) by mouth daily. 90 tablet 3  . b complex vitamins tablet Take 1 tablet by mouth daily.    . Bevacizumab (AVASTIN IV) Eye injection every 6 weeks    . cloNIDine (CATAPRES) 0.2 MG tablet Take 1 tablet (0.2 mg total) by mouth 3 (three) times daily. 270 tablet 3  . diazepam (VALIUM) 2 MG tablet Take 2 mg by mouth at bedtime as needed (for sleep).   0  . diltiazem (CARDIZEM) 30 MG tablet Take 30 mg by mouth 2 (two) times daily as needed (as needed for heart rate greater than 100).    [name] doxazosin (CARDURA) 2 MG tablet Take 1 tablet (2 mg total) by mouth at bedtime. 30 tablet 5  .  ELIQUIS 2.5 MG TABS tablet Take 1 tablet by mouth twice daily 180 tablet 1  . losartan (COZAAR) 100 MG tablet Take 100 mg by mouth daily. Per PCP     . Multiple Vitamins-Minerals (PRESERVISION AREDS 2) CAPS Take 1 capsule by mouth daily.    . predniSONE (DELTASONE) 5 MG tablet Take 5 mg by mouth 3 (three) times a week.   0  . carvedilol (COREG) 3.125 MG tablet Take 1 tablet (3.125 mg total) by mouth 2 (two) times daily. 60 tablet 3  . furosemide (LASIX) 20 MG tablet Take 1 tablet (20 mg total) by mouth daily. (Patient not taking: Reported on 03/31/2019) 30 tablet 1  . magnesium oxide (MAG-OX) 400 (241.3 Mg) MG tablet Take 2 tablets by mouth at bedtime.      No current facility-administered medications for this visit.    Allergies  Allergen Reactions  . Codeine Itching  . Penicillins  Itching, Rash and Other (See Comments)    Has patient had a PCN reaction causing immediate rash, facial/tongue/throat swelling, SOB or lightheadedness with hypotension: Yes Has patient had a PCN reaction causing severe rash involving mucus membranes or skin necrosis: No Has patient had a PCN reaction that required hospitalization: No Has patient had a PCN reaction occurring within the last 10 years: No If all of the above answers are "NO", then may proceed with Cephalosporin use.  [name] Hydralazine     Felt bad and could hear pulse in head     Past Medical History:  Diagnosis Date  . Atrial fibrillation ([place]) 2017   a. s/p DCCV in 10/2015  b. recurrent in 08/2016 --> rate-control pursued.   . Cancer ([place])    Breast  . CKD (chronic kidney disease)   . GCA (giant cell arteritis) ([place])   . Hypertension   . Macular degeneration   . Osteoporosis   . PMR (polymyalgia rheumatica) ([facility])   . Resistant hypertension 03/15/2019  . Shortness of breath 03/15/2019  . Skin cancer     Blood pressure (!) 150/73, pulse (!) 127, height 5' 5.5" (1.664 m), weight 145 lb (65.8 kg).  Essential hypertension Patient with essential hypertension, currently not to goal, but improved from last visit.  Unfortunately we are limited due to her CKD.  She is not willing to re-challenge with hydralazine 25 mg even twice daily.  Because she is still having AF w/RVR I will try switching her metoprolol succinate to carvedilol 3.125 mg bid for better HR and BP control.  Hopefully if she does well, we can titrate the dose upward to help with the higher HR.  She was encouraged to continue using the diltiazem as needed.  We will see her again in 1 month, she prefers to meet in person, so will schedule accordingly.     Tommy Medal PharmD CPP [facility] [address] [place], [place] [address] [phone]

## 2019-04-01 ENCOUNTER — Encounter: Payer: Self-pay | Admitting: Pharmacist Clinician (PhC)/ Clinical Pharmacy Specialist

## 2019-04-01 NOTE — Assessment & Plan Note (Signed)
Patient with essential hypertension, currently not to goal, but improved from last visit.  Unfortunately we are limited due to her CKD.  She is not willing to re-challenge with hydralazine 25 mg even twice daily.  Because she is still having AF w/RVR I will try switching her metoprolol succinate to carvedilol 3.125 mg bid for better HR and BP control.  Hopefully if she does well, we can titrate the dose upward to help with the higher HR.  She was encouraged to continue using the diltiazem as needed.  We will see her again in 1 month, she prefers to meet in person, so will schedule accordingly.

## 2019-04-01 NOTE — Patient Instructions (Signed)
Return for a a follow up appointment April 29 at 3:30  Your blood pressure today is 150/73  Check your blood pressure at home daily and keep record of the readings.  Take your BP meds as follows:  Stop metoprolol.    Start carvedilol 3.125 mg bid.  Continue all other medications  Bring all of your meds, your BP cuff and your record of home blood pressures to your next appointment.  Exercise as you're able, try to walk approximately 30 minutes per day.  Keep salt intake to a minimum, especially watch canned and prepared boxed foods.  Eat more fresh fruits and vegetables and fewer canned items.  Avoid eating in fast food restaurants.    HOW TO TAKE YOUR BLOOD PRESSURE: . Rest 5 minutes before taking your blood pressure. .  Don't smoke or drink caffeinated beverages for at least 30 minutes before. . Take your blood pressure before (not after) you eat. . Sit comfortably with your back supported and both feet on the floor (don't cross your legs). . Elevate your arm to heart level on a table or a desk. . Use the proper sized cuff. It should fit smoothly and snugly around your bare upper arm. There should be enough room to slip a fingertip under the cuff. The bottom edge of the cuff should be 1 inch above the crease of the elbow. . Ideally, take 3 measurements at one sitting and record the average.

## 2019-04-05 ENCOUNTER — Other Ambulatory Visit: Payer: Self-pay

## 2019-04-05 ENCOUNTER — Ambulatory Visit (HOSPITAL_COMMUNITY)
Admission: RE | Admit: 2019-04-05 | Discharge: 2019-04-05 | Disposition: A | Discharge status: Home / Self Care | Payer: Medicare Other | Source: Ambulatory Visit | Attending: Internal Medicine | Admitting: Internal Medicine

## 2019-04-05 DIAGNOSIS — I10 Essential (primary) hypertension: Secondary | ICD-10-CM | POA: Insufficient documentation

## 2019-04-14 ENCOUNTER — Telehealth: Payer: Self-pay | Admitting: Cardiovascular Disease

## 2019-04-14 NOTE — Telephone Encounter (Signed)
Spoke with the patient who states that ever since she has changed medications that she has not been feeling very well. She states that she is now taking Doxazosin at bedtime and carvedilol BID. She states that at night she has been waking up after a couple hours and has been having some shortness of breath and heaviness in her chest. She also complains of fatigue during the day, wheezing, and frequent burping. She states that SOB and chest heaviness only occur when she is lying down. She states that she feels much better when she gets up and last night she got up and slept on her recliner and felt much better. She states her latest BP readings were 144/87 and 131/55.

## 2019-04-14 NOTE — Telephone Encounter (Signed)
New Message    Pt is calling and is asking for Dr Quintella Reichert nurse to call her back  She says she thinks a medication is making her feel bad but she only wants to talk to the nurse about it     Please call

## 2019-04-14 NOTE — Telephone Encounter (Signed)
Returned call to patient.  Advised that she discontinue doxazosin for now.  Continue with all other medications.  BP this am was 140/81.  Advised that it might be a day or two for her to feel better, but should she not have any improvement in symptoms by Monday, she should let the office know.    Patient voiced understanding.

## 2019-04-15 NOTE — Telephone Encounter (Signed)
Agree  Thank you

## 2019-04-25 ENCOUNTER — Other Ambulatory Visit: Payer: Self-pay | Admitting: Internal Medicine

## 2019-04-25 DIAGNOSIS — I4891 Unspecified atrial fibrillation: Secondary | ICD-10-CM

## 2019-05-03 ENCOUNTER — Encounter (INDEPENDENT_AMBULATORY_CARE_PROVIDER_SITE_OTHER): Payer: Self-pay | Admitting: Ophthalmology

## 2019-05-03 ENCOUNTER — Other Ambulatory Visit: Payer: Self-pay

## 2019-05-03 ENCOUNTER — Ambulatory Visit (INDEPENDENT_AMBULATORY_CARE_PROVIDER_SITE_OTHER): Payer: Medicare Other | Admitting: Ophthalmology

## 2019-05-03 DIAGNOSIS — H353122 Nonexudative age-related macular degeneration, left eye, intermediate dry stage: Secondary | ICD-10-CM

## 2019-05-03 DIAGNOSIS — H35351 Cystoid macular degeneration, right eye: Secondary | ICD-10-CM | POA: Diagnosis not present

## 2019-05-03 DIAGNOSIS — H353211 Exudative age-related macular degeneration, right eye, with active choroidal neovascularization: Secondary | ICD-10-CM | POA: Insufficient documentation

## 2019-05-03 DIAGNOSIS — H353124 Nonexudative age-related macular degeneration, left eye, advanced atrophic with subfoveal involvement: Secondary | ICD-10-CM | POA: Insufficient documentation

## 2019-05-03 MED ORDER — AFLIBERCEPT 2MG/0.05ML IZ SOLN FOR KALEIDOSCOPE
2.0000 mg | INTRAVITREAL | Status: AC | PRN
  Administered 2019-05-03: 2 mg via INTRAVITREAL

## 2019-05-03 NOTE — Assessment & Plan Note (Signed)
The nature of wet macular degeneration was discussed with the patient.  Forms of therapy reviewed include the use of Anti-VEGF medications injected painlessly into the eye, as well as other possible treatment modalities, including thermal laser therapy. Fellow eye involvement and risks were discussed with the patient. Upon the finding of wet age related macular degeneration, treatment will be offered. The treatment regimen is on a treat as needed basis with the intent to treat if necessary and extend interval of exams when possible. On average 1 out of 6 patients do not need lifetime therapy. However, the risk of recurrent disease is high for a lifetime.  Initially monthly, then periodic, examinations and evaluations will determine whether the next treatment is required on the day of the examination.  OD improved, resistant to Avastin in the past.  Repeat E[place]a OD and exam in 8 weeks

## 2019-05-03 NOTE — Progress Notes (Signed)
05/03/2019     CHIEF COMPLAINT Patient presents for Retina Follow Up   HISTORY OF PRESENT ILLNESS: Allison Norman is a 86 y.o. female who presents to the clinic today for:   HPI    Retina Follow Up    Patient presents with  Wet AMD.  In right eye.  This started 8 weeks ago.  Severity is mild.  Duration of 8 weeks.  Since onset it is stable.          Comments    8 Week AMD F/U OD, poss Eylea OD  Pt reports overall stable VA OU. Pt denies new symptoms OU.       Last edited by Rockie Neighbours, [place] on 05/03/2019  1:49 PM. (History)      Referring physician: [name], MD [place],  [place] [address]  HISTORICAL INFORMATION:   Selected notes from the [facility]: No current outpatient medications on file. (Ophthalmic Drugs)   No current facility-administered medications for this visit. (Ophthalmic Drugs)   Current Outpatient Medications (Other)  Medication Sig  . amiodarone (PACERONE) 200 MG tablet Take 1 tablet (200 mg total) by mouth daily.  [name] amLODipine (NORVASC) 5 MG tablet Take 1 tablet (5 mg total) by mouth daily.  [name] b complex vitamins tablet Take 1 tablet by mouth daily.  . Bevacizumab (AVASTIN IV) Eye injection every 6 weeks  . carvedilol (COREG) 3.125 MG tablet Take 1 tablet (3.125 mg total) by mouth 2 (two) times daily.  . cloNIDine (CATAPRES) 0.2 MG tablet Take 1 tablet (0.2 mg total) by mouth 3 (three) times daily.  . diazepam (VALIUM) 2 MG tablet Take 2 mg by mouth at bedtime as needed (for sleep).   [name] diltiazem (CARDIZEM) 30 MG tablet Take 30 mg by mouth 2 (two) times daily as needed (as needed for heart rate greater than 100).  [name] ELIQUIS 2.5 MG TABS tablet Take 1 tablet by mouth twice daily  . furosemide (LASIX) 20 MG tablet Take 1 tablet (20 mg total) by mouth daily. (Patient not taking: Reported on 03/31/2019)  . losartan (COZAAR) 100 MG tablet Take 100 mg by mouth daily. Per PCP   .  magnesium oxide (MAG-OX) 400 (241.3 Mg) MG tablet Take 2 tablets by mouth at bedtime.   . Multiple Vitamins-Minerals (PRESERVISION AREDS 2) CAPS Take 1 capsule by mouth daily.  . predniSONE (DELTASONE) 5 MG tablet Take 5 mg by mouth 3 (three) times a week.    No current facility-administered medications for this visit. (Other)      REVIEW OF SYSTEMS:    ALLERGIES Allergies  Allergen Reactions  . Codeine Itching  . Penicillins Itching, Rash and Other (See Comments)    Has patient had a PCN reaction causing immediate rash, facial/tongue/throat swelling, SOB or lightheadedness with hypotension: Yes Has patient had a PCN reaction causing severe rash involving mucus membranes or skin necrosis: No Has patient had a PCN reaction that required hospitalization: No Has patient had a PCN reaction occurring within the last 10 years: No If all of the above answers are "NO", then may proceed with Cephalosporin use.  [name] Hydralazine     Felt bad and could hear pulse in head     PAST MEDICAL HISTORY Past Medical History:  Diagnosis Date  . Atrial fibrillation ([facility]) 2017   a. s/p DCCV in 10/2015  b. recurrent in 08/2016 --> rate-control pursued.   [name]  Cancer (HCC)    Breast  . CKD (chronic kidney disease)   . GCA (giant cell arteritis) ([facility])   . Hypertension   . Macular degeneration   . Osteoporosis   . PMR (polymyalgia rheumatica) (HCC)   . Resistant hypertension 03/15/2019  . Shortness of breath 03/15/2019  . Skin cancer    Past Surgical History:  Procedure Laterality Date  . CARDIOVERSION N/A 10/18/2015   Procedure: CARDIOVERSION;  Surgeon: Jerline Pain, MD;  Location: [facility];  Service: Cardiovascular;  Laterality: N/A;  . CARDIOVERSION N/A 02/20/2017   Procedure: CARDIOVERSION;  Surgeon: Pixie Casino, MD;  Location: [facility] ENDOSCOPY;  Service: Cardiovascular;  Laterality: N/A;  . CARDIOVERSION N/A 06/18/2017   Procedure: CARDIOVERSION;  Surgeon: Sanda Klein, MD;  Location: [facility]  ENDOSCOPY;  Service: Cardiovascular;  Laterality: N/A;  . CARDIOVERSION N/A 12/21/2018   Procedure: CARDIOVERSION;  Surgeon: Thayer Headings, MD;  Location: [facility] ENDOSCOPY;  Service: Cardiovascular;  Laterality: N/A;  . KNEE SURGERY  2013  . MASTECTOMY  1998   left side    FAMILY HISTORY Family History  Problem Relation Age of Onset  . Stroke Mother   . Kidney failure Father        died at 74  . Heart disease Sister        died at 55  . Heart disease Sister        died at 32  . Breast cancer Daughter     SOCIAL HISTORY Social History   Tobacco Use  . Smoking status: Former Smoker    Quit date: 08/31/1966    Years since quitting: 52.7  . Smokeless tobacco: Never Used  Substance Use Topics  . Alcohol use: No  . Drug use: No         OPHTHALMIC EXAM:  Base Eye Exam    Visual Acuity (ETDRS)      Right Left   Dist cc 20/30 CF at 3'   Dist ph cc  NI   Correction: Glasses       Tonometry (Tonopen, 1:54 PM)      Right Left   Pressure 15 15       Pupils      Pupils Dark Light Shape React APD   Right PERRL 4 3 Round Brisk None   Left PERRL 4 3 Round Brisk None       Visual Fields (Counting fingers)      Left Right    Full Full       Extraocular Movement      Right Left    Full Full       Neuro/Psych    Oriented x3: Yes   Mood/Affect: Normal       Dilation    Right eye: 1.0% Mydriacyl, 2.5% Phenylephrine @ 1:54 PM        Slit Lamp and Fundus Exam    External Exam      Right Left   External Normal Normal       Slit Lamp Exam      Right Left   Lids/Lashes Normal Normal   Conjunctiva/Sclera White and quiet White and quiet   Cornea Clear Clear   Anterior Chamber Deep and quiet Deep and quiet   Iris Round and reactive Round and reactive   Lens Posterior chamber intraocular lens, Open posterior capsule Posterior chamber intraocular lens, Open posterior capsule   Anterior Vitreous Normal Normal       Fundus Exam  Right Left   Posterior  Vitreous Normal Normal   Disc Normal Normal   C/D Ratio 0.45 0.5   Macula Geographic atrophy, Advanced age related macular degeneration, Disciform scar, [place], Retinal pigment epithelial mottling Geographic atrophy, Advanced age related macular degeneration, Disciform scar, Dr[place], Retinal pigment epithelial mottling   Vessels Normal Normal   Periphery Normal Normal          IMAGING AND PROCEDURES  Imaging and Procedures for 05/03/19  OCT, Retina - OU - Both Eyes       Right Eye Quality was good. Scan locations included subfoveal. Central Foveal Thickness: 298. Progression has improved. Findings include abnormal foveal contour, central retinal atrophy, subretinal scarring, choroidal neovascular membrane.   Left Eye Quality was good. Scan locations included subfoveal. Progression has been stable. Findings include abnormal foveal contour, outer retinal atrophy, central retinal atrophy, subretinal scarring.   Notes OD, much improved stable on Eylea.  Repeat today 8-week interval of exam       Intravitreal Injection, Pharmacologic Agent - OD - Right Eye       Time Out 05/03/2019. 2:48 PM. Confirmed correct patient, procedure, site, and patient consented.   Anesthesia Topical anesthesia was used. Anesthetic medications included Akten 3.5%.   Procedure Preparation included Tobramycin 0.3%, 10% betadine to eyelids. A 30 gauge needle was used.   Injection:  2 mg aflibercept Alfonse Flavors) SOLN   NDC: [id number], Lot: [id number]   Route: Intravitreal, Site: Right Eye, Waste: 0 mg  Post-op Post injection exam found visual acuity of at least counting fingers. The patient tolerated the procedure well. There were no complications. The patient received written and verbal post procedure care education. Post injection medications were not given.                 ASSESSMENT/PLAN:  Exudative age-related macular degeneration of right eye with active choroidal neovascularization  (HCC) The nature of wet macular degeneration was discussed with the patient.  Forms of therapy reviewed include the use of Anti-VEGF medications injected painlessly into the eye, as well as other possible treatment modalities, including thermal laser therapy. Fellow eye involvement and risks were discussed with the patient. Upon the finding of wet age related macular degeneration, treatment will be offered. The treatment regimen is on a treat as needed basis with the intent to treat if necessary and extend interval of exams when possible. On average 1 out of 6 patients do not need lifetime therapy. However, the risk of recurrent disease is high for a lifetime.  Initially monthly, then periodic, examinations and evaluations will determine whether the next treatment is required on the day of the examination.  OD improved, resistant to Avastin in the past.  Repeat Eylea OD and exam in 8 weeks      ICD-10-CM   1. Exudative age-related macular degeneration of right eye with active choroidal neovascularization (HCC)  H35.3211 OCT, Retina - OU - Both Eyes    Intravitreal Injection, Pharmacologic Agent - OD - Right Eye    aflibercept (EYLEA) SOLN 2 mg  2. Cystoid macular edema of right eye  H35.351   3. Intermediate stage nonexudative age-related macular degeneration of left eye  H35.3122     1.  Repeat Eylea OD today intravitreal, and examination in 8 weeks  2.  3.  Ophthalmic Meds Ordered this visit:  Meds ordered this encounter  Medications  . aflibercept (EYLEA) SOLN 2 mg       No follow-ups on file.  There are no  Patient Instructions on file for this visit.   Explained the diagnoses, plan, and follow up with the patient and they expressed understanding.  Patient expressed understanding of the importance of proper follow up care.   Clent Demark Glennis Borger M.D. Diseases & Surgery of the Retina and Vitreous [facility] [place] 05/03/19     Abbreviations: M myopia (nearsighted);  A astigmatism; H hyperopia (farsighted); P presbyopia; Mrx spectacle prescription;  CTL contact lenses; OD right eye; OS left eye; OU both eyes  XT exotropia; ET esotropia; PEK punctate epithelial keratitis; PEE punctate epithelial erosions; DES dry eye syndrome; MGD meibomian gland dysfunction; ATs artificial tears; PFAT's preservative free artificial tears; [place] nuclear sclerotic cataract; PSC posterior subcapsular cataract; ERM epi-retinal membrane; PVD posterior vitreous detachment; RD retinal detachment; DM diabetes mellitus; DR diabetic retinopathy; NPDR non-proliferative diabetic retinopathy; PDR proliferative diabetic retinopathy; CSME clinically significant macular edema; DME diabetic macular edema; dbh dot blot hemorrhages; CWS cotton wool spot; POAG primary open angle glaucoma; C/D cup-to-disc ratio; HVF humphrey visual field; GVF goldmann visual field; OCT optical coherence tomography; IOP intraocular pressure; BRVO Branch retinal vein occlusion; CRVO central retinal vein occlusion; CRAO central retinal artery occlusion; BRAO branch retinal artery occlusion; RT retinal tear; SB scleral buckle; PPV pars plana vitrectomy; VH Vitreous hemorrhage; PRP panretinal laser photocoagulation; IVK intravitreal kenalog; VMT vitreomacular traction; MH Macular hole;  NVD neovascularization of the disc; NVE neovascularization elsewhere; AREDS age related eye disease study; ARMD age related macular degeneration; POAG primary open angle glaucoma; EBMD epithelial/anterior basement membrane dystrophy; ACIOL anterior chamber intraocular lens; IOL intraocular lens; PCIOL posterior chamber intraocular lens; Phaco/IOL phacoemulsification with intraocular lens placement; [place] photorefractive keratectomy; LASIK laser assisted in situ keratomileusis; HTN hypertension; DM diabetes mellitus; COPD chronic obstructive pulmonary disease

## 2019-05-05 ENCOUNTER — Other Ambulatory Visit: Payer: Self-pay

## 2019-05-05 ENCOUNTER — Ambulatory Visit (INDEPENDENT_AMBULATORY_CARE_PROVIDER_SITE_OTHER): Payer: Medicare Other | Admitting: Pharmacist Clinician (PhC)/ Clinical Pharmacy Specialist

## 2019-05-05 ENCOUNTER — Ambulatory Visit: Payer: Medicare Other

## 2019-05-05 DIAGNOSIS — I10 Essential (primary) hypertension: Secondary | ICD-10-CM | POA: Diagnosis not present

## 2019-05-05 MED ORDER — MINOXIDIL 10 MG PO TABS: ORAL_TABLET | ORAL | 3 refills | Status: DC

## 2019-05-05 NOTE — Patient Instructions (Signed)
Return for a a follow up appointment Thursday June 3 at 3:00 pm   Check your blood pressure at home daily and keep record of the readings.  Take your BP meds as follows:  Start minoxidil 5 mg (1/2 tablet) once daily.  If after 2 weeks you are feeling well, but your BP is not much improved, please call us so we can try increasing dose to a full tablet.   Continue with all other medications.    Bring all of your meds, your BP cuff and your record of home blood pressures to your next appointment.  Exercise as you're able, try to walk approximately 30 minutes per day.  Keep salt intake to a minimum, especially watch canned and prepared boxed foods.  Eat more fresh fruits and vegetables and fewer canned items.  Avoid eating in fast food restaurants.    HOW TO TAKE YOUR BLOOD PRESSURE: . Rest 5 minutes before taking your blood pressure. .  Don't smoke or drink caffeinated beverages for at least 30 minutes before. . Take your blood pressure before (not after) you eat. . Sit comfortably with your back supported and both feet on the floor (don't cross your legs). . Elevate your arm to heart level on a table or a desk. . Use the proper sized cuff. It should fit smoothly and snugly around your bare upper arm. There should be enough room to slip a fingertip under the cuff. The bottom edge of the cuff should be 1 inch above the crease of the elbow. . Ideally, take 3 measurements at one sitting and record the average.

## 2019-05-05 NOTE — Progress Notes (Signed)
05/06/2019 AZAIAH MELLO 08/04/1924 [id number]   HPI:  AALIVIA MCGRAW is a 86 y.o. female patient of Dr Oval Linsey, with a [place] below who presents today for advanced hypertension clinic virtual follow up.  In addition to hypertension her medical history is significant for atrial fibrillation with RVR and renal insufficiency.  She saw Dr. Oval Linsey originally and had an in office BP of 172/78.  She was then asked to start doxazosin 2 mg qhs.  At her last visit we switched metoprolol to carvedilol in hopes of getting some better BP control.   About 2 weeks later patient called to state that she was having problems with waking at night being short of breath and having heaviness in her chest.  We discontinued doxazosin.  Renal artery dopplers were negative on April 05, 2019.    Today she returns for her second of 3 PharmD visits in the program.  She did not care for virtual visits and asked that we meet in person. She is here today wit her daughter.   At her last visit we switched metoprolol to carvedilol in hopes of getting some better BP control.   About 2 weeks later patient called to state that she was having problems with waking at night being short of breath and having heaviness in her chest.  We discontinued doxazosin and she noted feeling better the next day.  She states most home BP readings are 706/237 systolic, but usually shoot upward at MD offices.  She also notes that she has not felt the need to take any furosemide in the past few weeks.   Blood Pressure Goal:  130/80  Current Medications: carvedilol 3.125 mg bid, amlodipine 5 mg qd, clonidine 0.2 mg tid,  qhs, losartan 100 mg qd, diltiazem 30 mg prn HR > 100  Family Hx: mother had stroke, father with CKD  Social Hx: no tobacco, no alcohol, no caffeine  Exercise: No regular exercise - does walk some, but not terribly stable on her feet  Family history:  both parents with hypertension, both daughters (both stable controlled with  meds); parents mom 40 from stroke, dad 60 CKD  Diet: tries to monitor sodium, but does use at table.  Mostly home cooked meals, does not eat out much  Home BP readings: no readings for [place], but does note heart rate is running wide range from 47-130  Intolerances: hydralazine - not willing to re-challenge with lower dose  Amlodipine - LEE with 10 mg dose  Labs: 2/21:  Na 143, K 4.4, Glu 130, BUN 22, SCr 1.95  CrCl 18.9 GFR 22  Wt Readings from Last 3 Encounters:  05/05/19 139 lb (63 kg)  03/31/19 145 lb (65.8 kg)  03/03/19 143 lb (64.9 kg)   BP Readings from Last 3 Encounters:  05/05/19 (!) 200/64  03/31/19 (!) 150/73  03/03/19 (!) 172/78   Pulse Readings from Last 3 Encounters:  05/05/19 (!) 54  03/31/19 (!) 127  03/03/19 (!) 51    Current Outpatient Medications  Medication Sig Dispense Refill  . amiodarone (PACERONE) 200 MG tablet Take 1 tablet (200 mg total) by mouth daily. 90 tablet 2  . amLODipine (NORVASC) 5 MG tablet Take 1 tablet (5 mg total) by mouth daily. 90 tablet 3  . b complex vitamins tablet Take 1 tablet by mouth daily.    . Bevacizumab (AVASTIN IV) Eye injection every 6 weeks    . carvedilol (COREG) 3.125 MG tablet Take 1 tablet (3.125  mg total) by mouth 2 (two) times daily. 60 tablet 3  . cloNIDine (CATAPRES) 0.2 MG tablet Take 1 tablet (0.2 mg total) by mouth 3 (three) times daily. 270 tablet 3  . diazepam (VALIUM) 2 MG tablet Take 2 mg by mouth at bedtime as needed (for sleep).   0  . diltiazem (CARDIZEM) 30 MG tablet Take 30 mg by mouth 2 (two) times daily as needed (as needed for heart rate greater than 100).    [name] ELIQUIS 2.5 MG TABS tablet Take 1 tablet by mouth twice daily 180 tablet 0  . furosemide (LASIX) 20 MG tablet Take 1 tablet (20 mg total) by mouth daily. 30 tablet 1  . losartan (COZAAR) 100 MG tablet Take 100 mg by mouth daily. Per PCP     . Multiple Vitamins-Minerals (PRESERVISION AREDS 2) CAPS Take 1 capsule by mouth daily.    . predniSONE  (DELTASONE) 5 MG tablet Take 5 mg by mouth 3 (three) times a week.   0  . minoxidil (LONITEN) 10 MG tablet Take 1/2 to 1 tablet by mouth daily as directed 30 tablet 3   No current facility-administered medications for this visit.    Allergies  Allergen Reactions  . Codeine Itching  . Penicillins Itching, Rash and Other (See Comments)    Has patient had a PCN reaction causing immediate rash, facial/tongue/throat swelling, SOB or lightheadedness with hypotension: Yes Has patient had a PCN reaction causing severe rash involving mucus membranes or skin necrosis: No Has patient had a PCN reaction that required hospitalization: No Has patient had a PCN reaction occurring within the last 10 years: No If all of the above answers are "NO", then may proceed with Cephalosporin use.  . Doxazosin     Heavy feeling in chest, congestion, wheezing  . Doxycycline Other (See Comments)    Cause chest tightness  . Hydralazine     Felt bad and could hear pulse in head     Past Medical History:  Diagnosis Date  . Atrial fibrillation ([facility]) 2017   a. s/p DCCV in 10/2015  b. recurrent in 08/2016 --> rate-control pursued.   . Cancer ([facility])    Breast  . CKD (chronic kidney disease)   . GCA (giant cell arteritis) ([facility])   . Hypertension   . Macular degeneration   . Osteoporosis   . PMR (polymyalgia rheumatica) (HCC)   . Resistant hypertension 03/15/2019  . Shortness of breath 03/15/2019  . Skin cancer     Blood pressure (!) 200/64, pulse (!) 54, resp. rate 14, height 5' 5.5" (1.664 m), weight 139 lb (63 kg), SpO2 92 %.  Essential hypertension Patient with essential hypertension, as well as some degree of white coat hypertension.  She is somewhat limited due to poor kidney function and sensitivities.  We will have her try minoxidil 5 mg daily starting today.  She is to call back in 2 weeks and let us know if she is tolerating this and if her pressures have improved any.  We can wean the dose up to 10 mg  daily at that time.  Our other option would be to consider clonidine patches if this doesn't help any.  We will see her again in 1 month for third hypertension visit before sending her back to Dr Oval Linsey.     Tommy Medal PharmD CPP [facility] [address] [place], [place] [address] [phone]

## 2019-05-06 DIAGNOSIS — N184 Chronic kidney disease, stage 4 (severe): Secondary | ICD-10-CM | POA: Diagnosis not present

## 2019-05-06 DIAGNOSIS — I10 Essential (primary) hypertension: Secondary | ICD-10-CM | POA: Diagnosis not present

## 2019-05-06 DIAGNOSIS — M179 Osteoarthritis of knee, unspecified: Secondary | ICD-10-CM | POA: Diagnosis not present

## 2019-05-06 DIAGNOSIS — G4700 Insomnia, unspecified: Secondary | ICD-10-CM | POA: Diagnosis not present

## 2019-05-06 DIAGNOSIS — I4891 Unspecified atrial fibrillation: Secondary | ICD-10-CM | POA: Diagnosis not present

## 2019-05-06 DIAGNOSIS — E781 Pure hyperglyceridemia: Secondary | ICD-10-CM | POA: Diagnosis not present

## 2019-05-06 DIAGNOSIS — D649 Anemia, unspecified: Secondary | ICD-10-CM | POA: Diagnosis not present

## 2019-05-06 DIAGNOSIS — Z853 Personal history of malignant neoplasm of breast: Secondary | ICD-10-CM | POA: Diagnosis not present

## 2019-05-06 DIAGNOSIS — N183 Chronic kidney disease, stage 3 unspecified: Secondary | ICD-10-CM | POA: Diagnosis not present

## 2019-05-06 NOTE — Assessment & Plan Note (Addendum)
Patient with essential hypertension, as well as some degree of white coat hypertension.  She is somewhat limited due to poor kidney function and sensitivities.  We will have her try minoxidil 5 mg daily starting today.  She is to call back in 2 weeks and let us know if she is tolerating this and if her pressures have improved any.  We can wean the dose up to 10 mg daily at that time.  Our other option would be to consider clonidine patches if this doesn't help any.  We will see her again in 1 month for third hypertension visit before sending her back to Dr Oval Linsey.

## 2019-05-12 DIAGNOSIS — I4891 Unspecified atrial fibrillation: Secondary | ICD-10-CM | POA: Diagnosis not present

## 2019-05-12 DIAGNOSIS — D6869 Other thrombophilia: Secondary | ICD-10-CM | POA: Diagnosis not present

## 2019-05-12 DIAGNOSIS — G4700 Insomnia, unspecified: Secondary | ICD-10-CM | POA: Diagnosis not present

## 2019-05-12 DIAGNOSIS — K219 Gastro-esophageal reflux disease without esophagitis: Secondary | ICD-10-CM | POA: Diagnosis not present

## 2019-05-12 DIAGNOSIS — F411 Generalized anxiety disorder: Secondary | ICD-10-CM | POA: Diagnosis not present

## 2019-05-23 ENCOUNTER — Telehealth: Payer: Self-pay | Admitting: Pharmacist

## 2019-05-23 NOTE — Telephone Encounter (Signed)
Returned call to patient.  She is tolerating minoxidil fine, although still only taking 2.5 mg daily.  Notes morning BP readings mostly in the 597'I systolic.  However in the past week she has noted her heart rate increasing to 110-115 during the day.  She believes this is related to her AF, and will call AF clinic to see if she can be seen over there this week.  She is agreeable to continue with minoxidil for now and we can consider discontinuation if it truly is the cause of her elevated heart rates.

## 2019-05-23 NOTE — Telephone Encounter (Signed)
Patient calling to speak with Erasmo Downer. She want to report BP readings and discuss recommendations.  Please call back

## 2019-05-30 ENCOUNTER — Other Ambulatory Visit: Payer: Self-pay

## 2019-05-30 ENCOUNTER — Ambulatory Visit (HOSPITAL_COMMUNITY)
Admission: RE | Admit: 2019-05-30 | Discharge: 2019-05-30 | Disposition: A | Discharge status: Home / Self Care | Payer: Medicare Other | Source: Ambulatory Visit | Attending: Nurse Practitioner | Admitting: Nurse Practitioner

## 2019-05-30 VITALS — BP 140/76 | HR 105 | Ht 65.5 in | Wt 143.0 lb

## 2019-05-30 DIAGNOSIS — Z881 Allergy status to other antibiotic agents status: Secondary | ICD-10-CM | POA: Insufficient documentation

## 2019-05-30 DIAGNOSIS — Z888 Allergy status to other drugs, medicaments and biological substances status: Secondary | ICD-10-CM | POA: Diagnosis not present

## 2019-05-30 DIAGNOSIS — M315 Giant cell arteritis with polymyalgia rheumatica: Secondary | ICD-10-CM | POA: Diagnosis not present

## 2019-05-30 DIAGNOSIS — N189 Chronic kidney disease, unspecified: Secondary | ICD-10-CM | POA: Insufficient documentation

## 2019-05-30 DIAGNOSIS — Z880 Allergy status to penicillin: Secondary | ICD-10-CM | POA: Insufficient documentation

## 2019-05-30 DIAGNOSIS — I4819 Other persistent atrial fibrillation: Secondary | ICD-10-CM | POA: Diagnosis not present

## 2019-05-30 DIAGNOSIS — M810 Age-related osteoporosis without current pathological fracture: Secondary | ICD-10-CM | POA: Diagnosis not present

## 2019-05-30 DIAGNOSIS — Z7901 Long term (current) use of anticoagulants: Secondary | ICD-10-CM | POA: Insufficient documentation

## 2019-05-30 DIAGNOSIS — Z87891 Personal history of nicotine dependence: Secondary | ICD-10-CM | POA: Diagnosis not present

## 2019-05-30 DIAGNOSIS — Z9012 Acquired absence of left breast and nipple: Secondary | ICD-10-CM | POA: Insufficient documentation

## 2019-05-30 DIAGNOSIS — Z841 Family history of disorders of kidney and ureter: Secondary | ICD-10-CM | POA: Insufficient documentation

## 2019-05-30 DIAGNOSIS — Z885 Allergy status to narcotic agent status: Secondary | ICD-10-CM | POA: Insufficient documentation

## 2019-05-30 DIAGNOSIS — Z8249 Family history of ischemic heart disease and other diseases of the circulatory system: Secondary | ICD-10-CM | POA: Insufficient documentation

## 2019-05-30 DIAGNOSIS — D6869 Other thrombophilia: Secondary | ICD-10-CM

## 2019-05-30 DIAGNOSIS — I4892 Unspecified atrial flutter: Secondary | ICD-10-CM | POA: Insufficient documentation

## 2019-05-30 DIAGNOSIS — Z79899 Other long term (current) drug therapy: Secondary | ICD-10-CM | POA: Diagnosis not present

## 2019-05-30 DIAGNOSIS — I480 Paroxysmal atrial fibrillation: Secondary | ICD-10-CM | POA: Diagnosis not present

## 2019-05-30 DIAGNOSIS — I129 Hypertensive chronic kidney disease with stage 1 through stage 4 chronic kidney disease, or unspecified chronic kidney disease: Secondary | ICD-10-CM | POA: Diagnosis not present

## 2019-05-30 DIAGNOSIS — Z85828 Personal history of other malignant neoplasm of skin: Secondary | ICD-10-CM | POA: Diagnosis not present

## 2019-05-30 LAB — COMPREHENSIVE METABOLIC PANEL
ALT: 48 U/L — ABNORMAL HIGH (ref 0–44)
AST: 43 U/L — ABNORMAL HIGH (ref 15–41)
Albumin: 3.4 g/dL — ABNORMAL LOW (ref 3.5–5.0)
Alkaline Phosphatase: 109 U/L (ref 38–126)
Anion gap: 8 (ref 5–15)
BUN: 27 mg/dL — ABNORMAL HIGH (ref 8–23)
CO2: 26 mmol/L (ref 22–32)
Calcium: 8.5 mg/dL — ABNORMAL LOW (ref 8.9–10.3)
Chloride: 105 mmol/L (ref 98–111)
Creatinine, Ser: 1.89 mg/dL — ABNORMAL HIGH (ref 0.44–1.00)
GFR calc Af Amer: 26 mL/min — ABNORMAL LOW (ref >60)
GFR calc non Af Amer: 23 mL/min — ABNORMAL LOW (ref >60)
Glucose, Bld: 126 mg/dL — ABNORMAL HIGH (ref 70–99)
Potassium: 5.2 mmol/L — ABNORMAL HIGH (ref 3.5–5.1)
Sodium: 139 mmol/L (ref 135–145)
Total Bilirubin: 1 mg/dL (ref 0.3–1.2)
Total Protein: 6.7 g/dL (ref 6.5–8.1)

## 2019-05-30 LAB — CBC
HCT: 41.1 % (ref 36.0–46.0)
Hemoglobin: 12.8 g/dL (ref 12.0–15.0)
MCH: 30.1 pg (ref 26.0–34.0)
MCHC: 31.1 g/dL (ref 30.0–36.0)
MCV: 96.7 fL (ref 80.0–100.0)
Platelets: 230 10*3/uL (ref 150–400)
RBC: 4.25 MIL/uL (ref 3.87–5.11)
RDW: 15.9 % — ABNORMAL HIGH (ref 11.5–15.5)
WBC: 5.4 10*3/uL (ref 4.0–10.5)
nRBC: 0 % (ref 0.0–0.2)

## 2019-05-30 LAB — TSH: TSH: 2.264 u[IU]/mL (ref 0.350–4.500)

## 2019-05-31 ENCOUNTER — Encounter (HOSPITAL_COMMUNITY): Payer: Self-pay | Admitting: Nurse Practitioner

## 2019-05-31 NOTE — Progress Notes (Signed)
Primary Care Physician: [name], MD Referring Physician: [name] is a 86 y.o. female, appearing younger than stated age,with a h/o paroxysmal afib, dx fall of 2017, with successful cardioversion, but has had increase in  afib burden  since August. She was in [place] in November when seen by Dr. Debara Pickett. She is in the afib clinic to discuss options to treat.. Pt states that she has been under a lot of stress due to her 33 yo husband with progressive dementia and Hospice is now in the home. She feel that she has been in persistent afib since December.  She is busy during the day and does not notice her afib that much. She does notice it for the first several minutes trying to get to sleep. Does not notice any undo shortness of breath with exertion or fatigue. She is on apixaban correctly dosed at 2.5 mg bid for a chadsvasc score of 4. She noted increase in HR around 1/9 and was advised to go to the ER, waited 6 hours and had not been seen so left. She feels this elevated HR was due to stress over husband.  When seen in the afib clinic one week ago, we discussed options to manage her afib. She does not sound that symptomatic. BB was increased and she is for f/u. Her BP has been stable on higher dose of BB. HR's for the most part have her reasonably rate controlled. Offered cardioversion or amiodarone but she defers at this time.  F/u in afib clinic, she is now on cardizem 240 mg a day, dose increased last week when she felt her HR was still running too high. HR in office  now in the 70's. She has thought about options and would like to try a cardioversion. Last one held her almost a year. No missed does of eliquis.  F/u in afib clinic, she had successful cardioversion and continues in SR. She did have one short lived episode of  4:1 atrial flutter after DCCV but then went back into SR. She did not get her usual energy back right after cardioversion this time but is feeling some  better.   F/i in afib clinic, 04/15/17, unfortunately, pt has returned to afib for the last few weeks and has noted fluctuating HR/ BP readings. At home yesterday reported higher BP readings, in the office today at 156/84. It appears that the pt is getting more symptomatic with her afib and feels fatigued. I discussed with her starting amiodarone to pursue SR and she is in agreement.   F/u in fib clinic, 04/21/17 ,after starting on amiodarone 200 mg bid. She called the office yesterday with a HR in the upper 40's. BB dose was reduced to 1/2 tab daily. Today, she feels better and EKG shows SR with pac's in the 60's.  F/u in afib clinic, she feels improved but still has some slow heart rates but is not symptomatic. She is staying in [place]. Will reduce amiodarone to 200 mg once a day.  F/u in afib clinic, 05/25/17, she called in and was reporting some low heart rates so my nurse advised to cut BB in half.  Today, she is in aflutter with controlled v rate. Her husband died this week and from the stress may have caused her to get out of rhythm vrs, she also forgot her amiodarone for 4 days.  F/u in afib clinic, pt continues to be in afib despite increasing amiodarone to 200  mg bid. The recent death of her husband has caused a lot of stress which is probably contributing to persistence of afib Discussed cardioversion and pt is in agreement. No missed doses of eliquis.  F/u in afib clinic following cardioversion which was successful and she continues in Sinus brady in the 40's and will lower rate control. She was suppose to come in last week but had a mechanical fall  with a significant abrasion to her knee and is seeing her PCP for dressings.   F/u in afib clinic 12/15/18.She was recently seen by [name], PA, and was found to be back in Afib. She feels that she may  have been out for 2-3 weeks. She is also c/o LLE edema, Rt greater than Left for the last few weeks as well. Twisted the rt ankle last week. She  has been off lasix  for a while for issues with kidney function climbing but she feels her LLE needs some diuretic . She had cardioversion around a year ago and held till recently. She continues on amiodarone 200 mg qd.[name] increased her BB. Ekg shows afib with RVR at 106 bpm.CHA2DS2VASc score of at least 4, continues on eliquis 2.5 mg bid .  F/u with afib clinic s/p cardioversion, 12/22.  It was successful and she continues in SR. I put her back on lasix 20 mg every other day, which she took up til cardioversion but then stopped as she was not sure to take it. She is very concerned re her LLE, which has progressively gotten worse sine lasix was stopped in the fall 2/2 rising creatinine. She  felt that she had some throat congestion for a couple nights after the cardioversion. She really does not  feel better being in rhythm.   F/u in afib clinic, 05/30/19. She asked to be seen today as she has noted more swings in her heart rate from the 50's to 100's. She is in SR today. She  has been tired on several BP meds but has not tolerated recent hydralazine or minoxidil. She stopped minoxidil because of heart racing. BP is stable today.she takes lasix 20 mg as needed. She did awake last week with shortness of breath and heaviness in her chest. She  took 40 mg lasix and the sensation resolved within a few hours and she has been sleeping well since then.  Today, she denies symptoms of palpitations, chest pain, shortness of breath, orthopnea, PND, lower extremity edema, dizziness, presyncope, syncope, or neurologic sequela.+ fatigue. The patient is tolerating medications without difficulties and is otherwise without complaint today.   Past Medical History:  Diagnosis Date  . Atrial fibrillation ([place]) 2017   a. s/p DCCV in 10/2015  b. recurrent in 08/2016 --> rate-control pursued.   . Cancer ([place])    Breast  . CKD (chronic kidney disease)   . GCA (giant cell arteritis) ([place])   . Hypertension   . Macular  degeneration   . Osteoporosis   . PMR (polymyalgia rheumatica) ([facility])   . Resistant hypertension 03/15/2019  . Shortness of breath 03/15/2019  . Skin cancer    Past Surgical History:  Procedure Laterality Date  . CARDIOVERSION N/A 10/18/2015   Procedure: CARDIOVERSION;  Surgeon: Jerline Pain, MD;  Location: [facility];  Service: Cardiovascular;  Laterality: N/A;  . CARDIOVERSION N/A 02/20/2017   Procedure: CARDIOVERSION;  Surgeon: Pixie Casino, MD;  Location[facility] ENDOSCOPY;  Service: Cardiovascular;  Laterality: N/A;  . CARDIOVERSION N/A 06/18/2017   Procedure:  CARDIOVERSION;  Surgeon: Sanda Klein, MD;  Location: [facility] ENDOSCOPY;  Service: Cardiovascular;  Laterality: N/A;  . CARDIOVERSION N/A 12/21/2018   Procedure: CARDIOVERSION;  Surgeon: Thayer Headings, MD;  Location: [facility];  Service: Cardiovascular;  Laterality: N/A;  . KNEE SURGERY  2013  . MASTECTOMY  1998   left side    Current Outpatient Medications  Medication Sig Dispense Refill  . amiodarone (PACERONE) 200 MG tablet Take 1 tablet (200 mg total) by mouth daily. 90 tablet 2  . amLODipine (NORVASC) 5 MG tablet Take 1 tablet (5 mg total) by mouth daily. 90 tablet 3  . b complex vitamins tablet Take 1 tablet by mouth daily.    . Bevacizumab (AVASTIN IV) Eye injection every 6 weeks    . carvedilol (COREG) 3.125 MG tablet Take 1 tablet (3.125 mg total) by mouth 2 (two) times daily. 60 tablet 3  . cloNIDine (CATAPRES) 0.2 MG tablet Take 1 tablet (0.2 mg total) by mouth 3 (three) times daily. 270 tablet 3  . diazepam (VALIUM) 2 MG tablet Take 2 mg by mouth at bedtime as needed (for sleep).   0  . diltiazem (CARDIZEM) 30 MG tablet Take 30 mg by mouth 2 (two) times daily as needed (as needed for heart rate greater than 100).    [name] ELIQUIS 2.5 MG TABS tablet Take 1 tablet by mouth twice daily 180 tablet 0  . furosemide (LASIX) 20 MG tablet Take 1 tablet (20 mg total) by mouth daily. 30 tablet 1  . losartan (COZAAR) 100 MG  tablet Take 100 mg by mouth daily. Per PCP     . minoxidil (LONITEN) 10 MG tablet Take 1/2 to 1 tablet by mouth daily as directed 30 tablet 3  . Multiple Vitamins-Minerals (PRESERVISION AREDS 2) CAPS Take 1 capsule by mouth daily.    . predniSONE (DELTASONE) 5 MG tablet Take 5 mg by mouth 3 (three) times a week.   0   No current facility-administered medications for this encounter.    Allergies  Allergen Reactions  . Codeine Itching  . Penicillins Itching, Rash and Other (See Comments)    Has patient had a PCN reaction causing immediate rash, facial/tongue/throat swelling, SOB or lightheadedness with hypotension: Yes Has patient had a PCN reaction causing severe rash involving mucus membranes or skin necrosis: No Has patient had a PCN reaction that required hospitalization: No Has patient had a PCN reaction occurring within the last 10 years: No If all of the above answers are "NO", then may proceed with Cephalosporin use.  . Doxazosin     Heavy feeling in chest, congestion, wheezing  . Doxycycline Other (See Comments)    Cause chest tightness  . Hydralazine     Felt bad and could hear pulse in head     Social History   Socioeconomic History  . Marital status: Married    Spouse name: Not on file  . Number of children: 2  . Years of education: Not on file  . Highest education level: Not on file  Occupational History  . Not on file  Tobacco Use  . Smoking status: Former Smoker    Quit date: 08/31/1966    Years since quitting: 52.7  . Smokeless tobacco: Never Used  Substance and Sexual Activity  . Alcohol use: No  . Drug use: No  . Sexual activity: Not on file  Other Topics Concern  . Not on file  Social History Narrative   epworth sleepiness scale = 8 (  08/31/15)   Social Determinants of Health   Financial Resource Strain:   . Difficulty of Paying Living Expenses:   Food Insecurity:   . Worried About Charity fundraiser in the Last Year:   . Arboriculturist in the  Last Year:   Transportation Needs:   . Film/video editor (Medical):   [name] Lack of Transportation (Non-Medical):   Physical Activity:   . Days of Exercise per Week:   . Minutes of Exercise per Session:   Stress:   . Feeling of Stress :   Social Connections:   . Frequency of Communication with Friends and Family:   . Frequency of Social Gatherings with Friends and Family:   . Attends Religious Services:   . Active Member of Clubs or Organizations:   . Attends Archivist Meetings:   [name] Marital Status:   Intimate Partner Violence:   . Fear of Current or Ex-Partner:   . Emotionally Abused:   [name] Physically Abused:   . Sexually Abused:     Family History  Problem Relation Age of Onset  . Stroke Mother   . Kidney failure Father        died at 17  . Heart disease Sister        died at 55  . Heart disease Sister        died at 12  . Breast cancer Daughter     ROS- All systems are reviewed and negative except as per the HPI above  Physical Exam: Vitals:   05/30/19 1440  BP: 140/76  Pulse: (!) 105  Weight: 64.9 kg  Height: 5' 5.5" (1.664 m)   Wt Readings from Last 3 Encounters:  05/30/19 64.9 kg  05/05/19 63 kg  03/31/19 65.8 kg    Labs: Lab Results  Component Value Date   NA 139 05/30/2019   K 5.2 (H) 05/30/2019   CL 105 05/30/2019   CO2 26 05/30/2019   GLUCOSE 126 (H) 05/30/2019   BUN 27 (H) 05/30/2019   CREATININE 1.89 (H) 05/30/2019   CALCIUM 8.5 (L) 05/30/2019   MG 2.2 08/18/2016   Lab Results  Component Value Date   INR 1.1 10/11/2015   Lab Results  Component Value Date   CHOL  07/04/2009    81        ATP III CLASSIFICATION:  <200     mg/dL   Desirable  200-239  mg/dL   Borderline High  >=240    mg/dL   High          HDL 18 (L) 07/04/2009   LDLCALC  07/04/2009    50        Total Cholesterol/HDL:CHD Risk Coronary Heart Disease Risk Table                     Men   Women  1/2 Average Risk   3.4   3.3  Average Risk       5.0   4.4   2 X Average Risk   9.6   7.1  3 X Average Risk  23.4   11.0        Use the calculated Patient Ratio above and the CHD Risk Table to determine the patient's CHD Risk.        ATP III CLASSIFICATION (LDL):  <100     mg/dL   Optimal  100-129  mg/dL   Near or Above  Optimal  130-159  mg/dL   Borderline  160-189  mg/dL   High  >190     mg/dL   Very High   TRIG 64 07/04/2009     GEN- The patient is well appearing, alert and oriented x 3 today.   Head- normocephalic, atraumatic Eyes-  Sclera clear, conjunctiva pink Ears- hearing intact Oropharynx- clear Neck- supple, no JVP Lymph- no cervical lymphadenopathy Lungs- Clear to ausculation bilaterally, normal work of breathing Heart- regular rate and rhythm, (SR with PAc's) no murmurs, rubs or gallops, PMI not laterally displaced GI- soft, NT, ND, + BS Extremities- no clubbing, cyanosis, hard edema  rt worse than left  MS- no significant deformity or atrophy Skin- no rash or lesion Psych- euthymic mood, full affect Neuro- strength and sensation are intact  EKG- Sinus rhythm with first degree AV block,at 105 bpm qrs int 90 ms, qtc 555 ms, EKG reviewed form Decembre 2020, Sinus brady at 55 bpm with qtc 424 ms   Assessment and Plan: 1. Persistent  afib/flutter Will place a 2 week zio patch to assess pt complaints of swings in HR from the 50's to 100's.  Continue amiodarone 200 mg  qd Continue carvedilol at 12.5 mg bid  She will continue apixaban 2.5 mg bid for chadsvasc score of 4   2. HTN Stable  Will be back in touch with pt when the results of the monitor are back   Butch Penny C. Chinedum Vanhouten, [facility] [address] [place], [place] [address] [phone]

## 2019-06-09 ENCOUNTER — Ambulatory Visit (INDEPENDENT_AMBULATORY_CARE_PROVIDER_SITE_OTHER): Payer: Medicare Other | Admitting: Pharmacist Clinician (PhC)/ Clinical Pharmacy Specialist

## 2019-06-09 ENCOUNTER — Other Ambulatory Visit: Payer: Self-pay

## 2019-06-09 DIAGNOSIS — I10 Essential (primary) hypertension: Secondary | ICD-10-CM

## 2019-06-09 NOTE — Patient Instructions (Signed)
  Check your blood pressure at home a few times each week and keep record of the readings.  Take your BP meds as follows:  Increase minoxidil to 10 mg (whole tablet) daily  Try taking the furosemide every other day - if you need to take it daily, that is okay as well  Bring all of your meds, your BP cuff and your record of home blood pressures to your next appointment.  Exercise as you're able, try to walk approximately 30 minutes per day.  Keep salt intake to a minimum, especially watch canned and prepared boxed foods.  Eat more fresh fruits and vegetables and fewer canned items.  Avoid eating in fast food restaurants.    HOW TO TAKE YOUR BLOOD PRESSURE: . Rest 5 minutes before taking your blood pressure. .  Don't smoke or drink caffeinated beverages for at least 30 minutes before. . Take your blood pressure before (not after) you eat. . Sit comfortably with your back supported and both feet on the floor (don't cross your legs). . Elevate your arm to heart level on a table or a desk. . Use the proper sized cuff. It should fit smoothly and snugly around your bare upper arm. There should be enough room to slip a fingertip under the cuff. The bottom edge of the cuff should be 1 inch above the crease of the elbow. . Ideally, take 3 measurements at one sitting and record the average.

## 2019-06-09 NOTE — Progress Notes (Signed)
06/12/2019 Allison Norman 02-Jun-1924 [id number]   HPI:  Allison Norman is a 86 y.o. female patient of Dr Oval Linsey, with a [place] below who presents today for advanced hypertension clinic virtual follow up.  In addition to hypertension her medical history is significant for atrial fibrillation with RVR and renal insufficiency.  She saw Dr. Oval Linsey originally and had an in office BP of 172/78.  She was then asked to start doxazosin 2 mg qhs.  At her last visit we switched metoprolol to carvedilol in hopes of getting some better BP control.   About 2 weeks later patient called to state that she was having problems with waking at night being short of breath and having heaviness in her chest.  We discontinued doxazosin.  Renal artery dopplers were negative on April 05, 2019.    Since we saw her last she has been wearing a monitor from the AF clinic.  Has another week before she mails it back in. She also had an episode of SOB and chest pressure.  A family member noted that she had stopped taking her furosemide and had her re-start.  She felt better within 1-2 days and now is back to taking it daily.    Blood Pressure Goal:  130/80  Current Medications: carvedilol 3.125 mg bid, amlodipine 5 mg qd, clonidine 0.2 mg tid,  qhs, losartan 100 mg qd, minoxidil 5 mg  diltiazem 30 mg prn HR > 100  Family Hx: mother had stroke, father with CKD  Social Hx: no tobacco, no alcohol, no caffeine  Exercise: No regular exercise - does walk some, but not terribly stable on her feet  Family history:  both parents with hypertension, both daughters (both stable controlled with meds); parents mom 22 from stroke, dad 41 CKD  Diet: tries to monitor sodium, but does use at table.  Mostly home cooked meals, does not eat out much; appetitie not good, eats for meds more than for hunger  Home BP readings: 10 morning readings average 143/75 (range 112-164/59-92, 16 evening readings average 139/75 (range  112-162/65-91)  Intolerances: hydralazine - not willing to re-challenge with lower dose  Amlodipine - LEE with 10 mg dose  Labs: 2/21:  Na 143, K 4.4, Glu 130, BUN 22, SCr 1.95  CrCl 18.9 GFR 22  5/21:  Na 139, K 5.2, Glu 126 BUN 27, SCr 1.89 CrCl 19.3  Wt Readings from Last 3 Encounters:  06/09/19 142 lb (64.4 kg)  05/30/19 143 lb (64.9 kg)  05/05/19 139 lb (63 kg)   BP Readings from Last 3 Encounters:  06/09/19 (!) 142/78  05/30/19 140/76  05/05/19 (!) 200/64   Pulse Readings from Last 3 Encounters:  06/09/19 71  05/30/19 (!) 105  05/05/19 (!) 54    Current Outpatient Medications  Medication Sig Dispense Refill  . amiodarone (PACERONE) 200 MG tablet Take 1 tablet (200 mg total) by mouth daily. 90 tablet 2  . amLODipine (NORVASC) 5 MG tablet Take 1 tablet (5 mg total) by mouth daily. 90 tablet 3  . b complex vitamins tablet Take 1 tablet by mouth daily.    . Bevacizumab (AVASTIN IV) Eye injection every 6 weeks    . carvedilol (COREG) 3.125 MG tablet Take 1 tablet (3.125 mg total) by mouth 2 (two) times daily. 60 tablet 3  . cloNIDine (CATAPRES) 0.2 MG tablet Take 1 tablet (0.2 mg total) by mouth 3 (three) times daily. 270 tablet 3  . diazepam (VALIUM) 2 MG  tablet Take 2 mg by mouth at bedtime as needed (for sleep).   0  . diltiazem (CARDIZEM) 30 MG tablet Take 30 mg by mouth 2 (two) times daily as needed (as needed for heart rate greater than 100).    [name] ELIQUIS 2.5 MG TABS tablet Take 1 tablet by mouth twice daily 180 tablet 0  . furosemide (LASIX) 20 MG tablet Take 1 tablet (20 mg total) by mouth daily. 30 tablet 1  . losartan (COZAAR) 100 MG tablet Take 100 mg by mouth daily. Per PCP     . minoxidil (LONITEN) 10 MG tablet Take 1/2 to 1 tablet by mouth daily as directed 30 tablet 3  . Multiple Vitamins-Minerals (PRESERVISION AREDS 2) CAPS Take 1 capsule by mouth daily.    . predniSONE (DELTASONE) 5 MG tablet Take 5 mg by mouth 3 (three) times a week.   0   No current  facility-administered medications for this visit.    Allergies  Allergen Reactions  . Codeine Itching  . Penicillins Itching, Rash and Other (See Comments)    Has patient had a PCN reaction causing immediate rash, facial/tongue/throat swelling, SOB or lightheadedness with hypotension: Yes Has patient had a PCN reaction causing severe rash involving mucus membranes or skin necrosis: No Has patient had a PCN reaction that required hospitalization: No Has patient had a PCN reaction occurring within the last 10 years: No If all of the above answers are "NO", then may proceed with Cephalosporin use.  . Doxazosin     Heavy feeling in chest, congestion, wheezing  . Doxycycline Other (See Comments)    Cause chest tightness  . Hydralazine     Felt bad and could hear pulse in head     Past Medical History:  Diagnosis Date  . Atrial fibrillation ([place]) 2017   a. s/p DCCV in 10/2015  b. recurrent in 08/2016 --> rate-control pursued.   . Cancer ([place])    Breast  . CKD (chronic kidney disease)   . GCA (giant cell arteritis) ([place])   . Hypertension   . Macular degeneration   . Osteoporosis   . PMR (polymyalgia rheumatica) ([facility])   . Resistant hypertension 03/15/2019  . Shortness of breath 03/15/2019  . Skin cancer     Blood pressure (!) 142/78, pulse 71, resp. rate 14, height 5\' 5"  (1.651 m), weight 142 lb (64.4 kg), SpO2 95 %.  Essential hypertension Patient with essential hypertension, still not at goal but improving.  With her age we discussed the option of just continuing with her current regimen, but patient would like to see if she can get better control.  Would like to increase the minxoidil to a full 10 mg once daily to see if this improves her readings.  We have seen her 3 times now in [facility] and will have her follow up with Dr. Oval Linsey in 1-2 months.     Tommy Medal PharmD CPP [facility] [address] [place], [place] [address] [phone]

## 2019-06-12 ENCOUNTER — Encounter: Payer: Self-pay | Admitting: Pharmacist Clinician (PhC)/ Clinical Pharmacy Specialist

## 2019-06-12 NOTE — Assessment & Plan Note (Signed)
Patient with essential hypertension, still not at goal but improving.  With her age we discussed the option of just continuing with her current regimen, but patient would like to see if she can get better control.  Would like to increase the minxoidil to a full 10 mg once daily to see if this improves her readings.  We have seen her 3 times now in [facility] and will have her follow up with Dr. Oval Linsey in 1-2 months.

## 2019-06-17 ENCOUNTER — Telehealth: Payer: Self-pay | Admitting: Pharmacist

## 2019-06-17 MED ORDER — MINOXIDIL 2.5 MG PO TABS: 2.5000 mg | ORAL_TABLET | Freq: Two times a day (BID) | ORAL | 1 refills | Status: DC

## 2019-06-17 NOTE — Telephone Encounter (Signed)
Blood pressure issues   Not taking furosemide daily (not more than 4 times per week).  Patient BP dropped to 109/48 (chest discomfort, lack energy) after taking minoxidil 10mg , but goes up to 160s if she skips the minoxidil.  Will send Rx for minoxidil 2.5mg  tables for patient to take 2.5mg  BID.

## 2019-06-28 ENCOUNTER — Ambulatory Visit (INDEPENDENT_AMBULATORY_CARE_PROVIDER_SITE_OTHER): Payer: Medicare Other | Admitting: Ophthalmology

## 2019-06-28 ENCOUNTER — Other Ambulatory Visit: Payer: Self-pay

## 2019-06-28 ENCOUNTER — Telehealth (HOSPITAL_COMMUNITY): Payer: Self-pay | Admitting: Nurse Practitioner

## 2019-06-28 ENCOUNTER — Encounter (INDEPENDENT_AMBULATORY_CARE_PROVIDER_SITE_OTHER): Payer: Self-pay | Admitting: Ophthalmology

## 2019-06-28 DIAGNOSIS — H00026 Hordeolum internum left eye, unspecified eyelid: Secondary | ICD-10-CM | POA: Diagnosis not present

## 2019-06-28 DIAGNOSIS — H353211 Exudative age-related macular degeneration, right eye, with active choroidal neovascularization: Secondary | ICD-10-CM | POA: Diagnosis not present

## 2019-06-28 DIAGNOSIS — H00025 Hordeolum internum left lower eyelid: Secondary | ICD-10-CM | POA: Insufficient documentation

## 2019-06-28 HISTORY — DX: Hordeolum internum left lower eyelid: H00.025

## 2019-06-28 MED ORDER — AFLIBERCEPT 2MG/0.05ML IZ SOLN FOR KALEIDOSCOPE
2.0000 mg | INTRAVITREAL | Status: AC | PRN
  Administered 2019-06-28: 2 mg via INTRAVITREAL

## 2019-06-28 MED ORDER — BACITRACIN-POLYMYXIN B 500-10000 UNIT/GM OP OINT
1.0000 | TOPICAL_OINTMENT | Freq: Every evening | OPHTHALMIC | 11 refills | Status: DC
Start: 2019-06-28 — End: 2019-11-29

## 2019-06-28 NOTE — Progress Notes (Signed)
06/28/2019     CHIEF COMPLAINT Patient presents for Retina Follow Up   HISTORY OF PRESENT ILLNESS: Allison Norman is a 86 y.o. female who presents to the clinic today for:   HPI    Retina Follow Up    Patient presents with  Wet AMD.  In right eye.  Severity is moderate.  Duration of 8 weeks.  Since onset it is stable.  I, the attending physician,  performed the HPI with the patient and updated documentation appropriately.          Comments    8 Week AMD f\u OD. Possible Eylea OD. OCT  Pt states no changes in vision. Pt c/o LLL being red and swollen. She first noticed this about 2 weeks ago.       Last edited by Tilda Franco on 06/28/2019  2:27 PM. (History)      Referring physician: [name], MD [place],  [place] [address]  HISTORICAL INFORMATION:   Selected notes from the [facility]: No current outpatient medications on file. (Ophthalmic Drugs)   No current facility-administered medications for this visit. (Ophthalmic Drugs)   Current Outpatient Medications (Other)  Medication Sig  . amiodarone (PACERONE) 200 MG tablet Take 1 tablet (200 mg total) by mouth daily.  [name] amLODipine (NORVASC) 5 MG tablet Take 1 tablet (5 mg total) by mouth daily.  [name] b complex vitamins tablet Take 1 tablet by mouth daily.  . Bevacizumab (AVASTIN IV) Eye injection every 6 weeks  . carvedilol (COREG) 3.125 MG tablet Take 1 tablet (3.125 mg total) by mouth 2 (two) times daily.  . cloNIDine (CATAPRES) 0.2 MG tablet Take 1 tablet (0.2 mg total) by mouth 3 (three) times daily.  . diazepam (VALIUM) 2 MG tablet Take 2 mg by mouth at bedtime as needed (for sleep).   [name] diltiazem (CARDIZEM) 30 MG tablet Take 30 mg by mouth 2 (two) times daily as needed (as needed for heart rate greater than 100).  [name] ELIQUIS 2.5 MG TABS tablet Take 1 tablet by mouth twice daily  . furosemide (LASIX) 20 MG tablet Take 1 tablet (20 mg total) by  mouth daily.  [name] losartan (COZAAR) 100 MG tablet Take 100 mg by mouth daily. Per PCP   . minoxidil (LONITEN) 2.5 MG tablet Take 1 tablet (2.5 mg total) by mouth 2 (two) times daily.  . Multiple Vitamins-Minerals (PRESERVISION AREDS 2) CAPS Take 1 capsule by mouth daily.  . predniSONE (DELTASONE) 5 MG tablet Take 5 mg by mouth 3 (three) times a week.    No current facility-administered medications for this visit. (Other)      REVIEW OF SYSTEMS:    ALLERGIES Allergies  Allergen Reactions  . Codeine Itching  . Penicillins Itching, Rash and Other (See Comments)    Has patient had a PCN reaction causing immediate rash, facial/tongue/throat swelling, SOB or lightheadedness with hypotension: Yes Has patient had a PCN reaction causing severe rash involving mucus membranes or skin necrosis: No Has patient had a PCN reaction that required hospitalization: No Has patient had a PCN reaction occurring within the last 10 years: No If all of the above answers are "NO", then may proceed with Cephalosporin use.  . Doxazosin     Heavy feeling in chest, congestion, wheezing  . Doxycycline Other (See Comments)    Cause chest tightness  . Hydralazine     Felt bad and  could hear pulse in head     PAST MEDICAL HISTORY Past Medical History:  Diagnosis Date  . Atrial fibrillation ([place]) 2017   a. s/p DCCV in 10/2015  b. recurrent in 08/2016 --> rate-control pursued.   . Cancer ([place])    Breast  . CKD (chronic kidney disease)   . GCA (giant cell arteritis) ([place])   . Hypertension   . Macular degeneration   . Osteoporosis   . PMR (polymyalgia rheumatica) ([facility])   . Resistant hypertension 03/15/2019  . Shortness of breath 03/15/2019  . Skin cancer    Past Surgical History:  Procedure Laterality Date  . CARDIOVERSION N/A 10/18/2015   Procedure: CARDIOVERSION;  Surgeon: Jerline Pain, MD;  Location: [facility];  Service: Cardiovascular;  Laterality: N/A;  . CARDIOVERSION N/A 02/20/2017   Procedure:  CARDIOVERSION;  Surgeon: Pixie Casino, MD;  Location[facility] ENDOSCOPY;  Service: Cardiovascular;  Laterality: N/A;  . CARDIOVERSION N/A 06/18/2017   Procedure: CARDIOVERSION;  Surgeon: Sanda Klein, MD;  Location: [facility] ENDOSCOPY;  Service: Cardiovascular;  Laterality: N/A;  . CARDIOVERSION N/A 12/21/2018   Procedure: CARDIOVERSION;  Surgeon: Thayer Headings, MD;  Location: [facility] ENDOSCOPY;  Service: Cardiovascular;  Laterality: N/A;  . KNEE SURGERY  2013  . MASTECTOMY  1998   left side    FAMILY HISTORY Family History  Problem Relation Age of Onset  . Stroke Mother   . Kidney failure Father        died at 40  . Heart disease Sister        died at 84  . Heart disease Sister        died at 53  . Breast cancer Daughter     SOCIAL HISTORY Social History   Tobacco Use  . Smoking status: Former Smoker    Quit date: 08/31/1966    Years since quitting: 52.8  . Smokeless tobacco: Never Used  Vaping Use  . Vaping Use: Never used  Substance Use Topics  . Alcohol use: No  . Drug use: No         OPHTHALMIC EXAM:  Base Eye Exam    Visual Acuity (Snellen - Linear)      Right Left   Dist cc 20/30 -1 CF @ 3'   Dist ph cc NI NI       Tonometry (Tonopen, 2:31 PM)      Right Left   Pressure 12 13       Pupils      Pupils Dark Light Shape React APD   Right PERRL 4 3 Round Brisk None   Left PERRL 4 3 Round Brisk None       Visual Fields (Counting fingers)      Left Right    Full Full       Neuro/Psych    Oriented x3: Yes   Mood/Affect: Normal       Dilation    Right eye: 1.0% Mydriacyl, 2.5% Phenylephrine @ 2:31 PM        Slit Lamp and Fundus Exam    External Exam      Right Left   External Normal Normal       Slit Lamp Exam      Right Left   Lids/Lashes Normal  internal Hordeolum - lower lid, Ectropion, 1+ Collarettes   Conjunctiva/Sclera White and quiet White and quiet   Cornea Clear Clear   Anterior Chamber Deep and quiet Deep and quiet   Iris Round  and  reactive Round and reactive   Lens Posterior chamber intraocular lens, Open posterior capsule Posterior chamber intraocular lens, Open posterior capsule   Anterior Vitreous Normal Normal       Fundus Exam      Right Left   Posterior Vitreous Normal    Disc Normal    C/D Ratio 0.45    Macula Geographic atrophy, Advanced age related macular degeneration, Disciform scar, Drusen, Retinal pigment epithelial mottling    Vessels Normal    Periphery Normal           IMAGING AND PROCEDURES  Imaging and Procedures for 06/28/19  OCT, Retina - OU - Both Eyes       Right Eye Quality was good. Scan locations included subfoveal. Central Foveal Thickness: 297. Progression has been stable. Findings include central retinal atrophy, subretinal scarring, outer retinal atrophy, pigment epithelial detachment, no SRF.   Left Eye Quality was good. Central Foveal Thickness: 311. Progression has been stable. Findings include retinal drusen , outer retinal atrophy, central retinal atrophy, subretinal scarring.   Notes Stable OD, monocular patient on intravitreal Eylea, 8-week interval repeat today       Intravitreal Injection, Pharmacologic Agent - OD - Right Eye       Time Out 06/28/2019. 2:50 PM. Confirmed correct patient, procedure, site, and patient consented.   Anesthesia Topical anesthesia was used. Anesthetic medications included Akten 3.5%.   Procedure Preparation included Ofloxacin , 10% betadine to eyelids, 5% betadine to ocular surface. A 30 gauge needle was used.   Injection:  2 mg aflibercept Alfonse Flavors) SOLN   NDC: [id number], Lot: [id number]   Route: Intravitreal, Site: Right Eye, Waste: 0 mg  Post-op Post injection exam found visual acuity of at least counting fingers. The patient tolerated the procedure well. There were no complications. The patient received written and verbal post procedure care education. Post injection medications were not given.                  ASSESSMENT/PLAN:  Exudative age-related macular degeneration of right eye with active choroidal neovascularization (HCC) Now stable right eye on intravitreal Eylea, 8-week interval repeat today  Hordeolum internum left lower eyelid Will prescribe topical ophthalmic ointment to use at bedtime and schedule follow-up examination with [facility], Shirleen Schirmer, PA      ICD-10-CM   1. Exudative age-related macular degeneration of right eye with active choroidal neovascularization (HCC)  H35.3211 OCT, Retina - OU - Both Eyes    Intravitreal Injection, Pharmacologic Agent - OD - Right Eye    aflibercept (EYLEA) SOLN 2 mg  2. Hordeolum internum left lower eyelid  H00.025   3. Meibomian blepharitis, left  H00.026     1.  Patient has new internal hordeolum of the left lower eyelid in addition to a mild ectropion.  Will prescribe topical Polysporin ophthalmic ointment to use nightly for some portion of the lid disease, however she needs a complete lid hygiene regimen have asked return to Dr. [facility], attention to Shirleen Schirmer, PA  2.  3.  Ophthalmic Meds Ordered this visit:  Meds ordered this encounter  Medications  . aflibercept (EYLEA) SOLN 2 mg       Return in about 8 weeks (around 08/23/2019) for dilate, OD, EYLEA OCT.  There are no Patient Instructions on file for this visit.   Explained the diagnoses, plan, and follow up with the patient and they expressed understanding.  Patient expressed understanding of the importance of proper follow up  care.   Clent Demark. Laprecious Austill M.D. Diseases & Surgery of the Retina and Vitreous [facility] [place] 06/28/19     Abbreviations: M myopia (nearsighted); A astigmatism; H hyperopia (farsighted); P presbyopia; Mrx spectacle prescription;  CTL contact lenses; OD right eye; OS left eye; OU both eyes  XT exotropia; ET esotropia; PEK punctate epithelial keratitis; PEE punctate epithelial erosions; DES dry eye syndrome;  MGD meibomian gland dysfunction; ATs artificial tears; PFAT's preservative free artificial tears; [place] nuclear sclerotic cataract; PSC posterior subcapsular cataract; ERM epi-retinal membrane; PVD posterior vitreous detachment; RD retinal detachment; DM diabetes mellitus; DR diabetic retinopathy; NPDR non-proliferative diabetic retinopathy; PDR proliferative diabetic retinopathy; CSME clinically significant macular edema; DME diabetic macular edema; dbh dot blot hemorrhages; CWS cotton wool spot; POAG primary open angle glaucoma; C/D cup-to-disc ratio; HVF humphrey visual field; GVF goldmann visual field; OCT optical coherence tomography; IOP intraocular pressure; BRVO Branch retinal vein occlusion; CRVO central retinal vein occlusion; CRAO central retinal artery occlusion; BRAO branch retinal artery occlusion; RT retinal tear; SB scleral buckle; PPV pars plana vitrectomy; VH Vitreous hemorrhage; PRP panretinal laser photocoagulation; IVK intravitreal kenalog; VMT vitreomacular traction; MH Macular hole;  NVD neovascularization of the disc; NVE neovascularization elsewhere; AREDS age related eye disease study; ARMD age related macular degeneration; POAG primary open angle glaucoma; EBMD epithelial/anterior basement membrane dystrophy; ACIOL anterior chamber intraocular lens; IOL intraocular lens; PCIOL posterior chamber intraocular lens; Phaco/IOL phacoemulsification with intraocular lens placement; [place] photorefractive keratectomy; LASIK laser assisted in situ keratomileusis; HTN hypertension; DM diabetes mellitus; COPD chronic obstructive pulmonary disease

## 2019-06-28 NOTE — Assessment & Plan Note (Signed)
Will prescribe topical ophthalmic ointment to use at bedtime and schedule follow-up examination with [facility], Shirleen Schirmer, PA

## 2019-06-28 NOTE — Addendum Note (Signed)
Encounter addended by: Juluis Mire, RN on: 06/28/2019 3:32 PM  Actions taken: Imaging Exam ended

## 2019-06-28 NOTE — Assessment & Plan Note (Signed)
Now stable right eye on intravitreal Eylea, 8-week interval repeat today

## 2019-06-28 NOTE — Telephone Encounter (Signed)
Reviewed Zio patch. Pt  has tachy/brady with frequent 3 sec pauses. I will refer pt  to Dr. Lovena Le for evaluation to see if   PPM is indicated. In the interim will ask pt to reduce amiodarone to 100 mg daily.

## 2019-06-30 ENCOUNTER — Other Ambulatory Visit (HOSPITAL_COMMUNITY): Payer: Self-pay | Admitting: *Deleted

## 2019-06-30 DIAGNOSIS — I495 Sick sinus syndrome: Secondary | ICD-10-CM

## 2019-06-30 MED ORDER — AMIODARONE HCL 200 MG PO TABS: 100.0000 mg | ORAL_TABLET | Freq: Every day | ORAL | 2 refills | Status: DC

## 2019-06-30 NOTE — Progress Notes (Signed)
amb  

## 2019-06-30 NOTE — Telephone Encounter (Signed)
Unable to reach patient - spoke with patients daughter, carolyn Tamala Julian that accompanies her to all her office visits - understood recommendations - will have scheduling call to set up appt with Dr. Lovena Le and decrease amiodarone to 100mg  once a day - daughter states pt has been feeling badly for the last several weeks.

## 2019-07-05 ENCOUNTER — Other Ambulatory Visit: Payer: Self-pay

## 2019-07-05 ENCOUNTER — Encounter: Payer: Self-pay | Admitting: Internal Medicine

## 2019-07-05 ENCOUNTER — Ambulatory Visit (INDEPENDENT_AMBULATORY_CARE_PROVIDER_SITE_OTHER): Payer: Medicare Other | Admitting: Internal Medicine

## 2019-07-05 VITALS — BP 164/68 | HR 51 | Ht 65.5 in | Wt 151.6 lb

## 2019-07-05 DIAGNOSIS — I4891 Unspecified atrial fibrillation: Secondary | ICD-10-CM | POA: Diagnosis not present

## 2019-07-05 DIAGNOSIS — R001 Bradycardia, unspecified: Secondary | ICD-10-CM | POA: Diagnosis not present

## 2019-07-05 LAB — CBC WITH DIFFERENTIAL/PLATELET
Basophils Absolute: 0 10*3/uL (ref 0.0–0.2)
Basos: 0 %
EOS (ABSOLUTE): 0.3 10*3/uL (ref 0.0–0.4)
Eos: 5 %
Hematocrit: 35.9 % (ref 34.0–46.6)
Hemoglobin: 11.7 g/dL (ref 11.1–15.9)
Lymphocytes Absolute: 1.3 10*3/uL (ref 0.7–3.1)
Lymphs: 22 %
MCH: 29.7 pg (ref 26.6–33.0)
MCHC: 32.6 g/dL (ref 31.5–35.7)
MCV: 91 fL (ref 79–97)
Monocytes Absolute: 1 10*3/uL — ABNORMAL HIGH (ref 0.1–0.9)
Monocytes: 17 %
Neutrophils Absolute: 3.3 10*3/uL (ref 1.4–7.0)
Neutrophils: 56 %
Platelets: 239 10*3/uL (ref 150–450)
RBC: 3.94 x10E6/uL (ref 3.77–5.28)
RDW: 15.6 % — ABNORMAL HIGH (ref 11.7–15.4)
WBC: 5.8 10*3/uL (ref 3.4–10.8)

## 2019-07-05 LAB — BASIC METABOLIC PANEL
BUN/Creatinine Ratio: 18 (ref 12–28)
BUN: 36 mg/dL (ref 10–36)
CO2: 25 mmol/L (ref 20–29)
Calcium: 8.7 mg/dL (ref 8.7–10.3)
Chloride: 105 mmol/L (ref 96–106)
Creatinine, Ser: 1.99 mg/dL — ABNORMAL HIGH (ref 0.57–1.00)
GFR calc Af Amer: 25 mL/min/{1.73_m2} — ABNORMAL LOW (ref >59)
GFR calc non Af Amer: 21 mL/min/{1.73_m2} — ABNORMAL LOW (ref >59)
Glucose: 93 mg/dL (ref 65–99)
Potassium: 4.7 mmol/L (ref 3.5–5.2)
Sodium: 138 mmol/L (ref 134–144)

## 2019-07-05 NOTE — Patient Instructions (Addendum)
Medication Instructions:  °Your physician recommends that you continue on your current medications as directed. Please refer to the Current Medication list given to you today. ° °Labwork: °You will get lab work today:  CBC and BMP ° °Testing/Procedures: °None ordered. ° °Follow-Up: °SEE INSTRUCTION LETTER ° °Any Other Special Instructions Will Be Listed Below (If Applicable). ° °If you need a refill on your cardiac medications before your next appointment, please call your pharmacy.  ° ° °Pacemaker Implantation, Adult °Pacemaker implantation is a procedure to place a pacemaker inside your chest. A pacemaker is a small computer that sends electrical signals to the heart and helps your heart beat normally. A pacemaker also stores information about your heart rhythms. You may need pacemaker implantation if you: °· Have a slow heartbeat (bradycardia). °· Faint (syncope). °· Have shortness of breath (dyspnea) due to heart problems. °The pacemaker attaches to your heart through a wire, called a lead. Sometimes just one lead is needed. Other times, there will be two leads. There are two types of pacemakers: °· Transvenous pacemaker. This type is placed under the skin or muscle of your chest. The lead goes through a vein in the chest area to reach the inside of the heart. °· Epicardial pacemaker. This type is placed under the skin or muscle of your chest or belly. The lead goes through your chest to the outside of the heart. °Tell a health care provider about: °· Any allergies you have. °· All medicines you are taking, including vitamins, herbs, eye drops, creams, and over-the-counter medicines. °· Any problems you or family members have had with anesthetic medicines. °· Any blood or bone disorders you have. °· Any surgeries you have had. °· Any medical conditions you have. °· Whether you are pregnant or may be pregnant. °What are the risks? °Generally, this is a safe procedure. However, problems may occur,  including: °· Infection. °· Bleeding. °· Failure of the pacemaker or the lead. °· Collapse of a lung or bleeding into a lung. °· Blood clot inside a blood vessel with a lead. °· Damage to the heart. °· Infection inside the heart (endocarditis). °· Allergic reactions to medicines. °What happens before the procedure? °Staying hydrated °Follow instructions from your health care provider about hydration, which may include: °· Up to 2 hours before the procedure - you may continue to drink clear liquids, such as water, clear fruit juice, black coffee, and plain tea. °Eating and drinking restrictions °Follow instructions from your health care provider about eating and drinking, which may include: °· 8 hours before the procedure - stop eating heavy meals or foods such as meat, fried foods, or fatty foods. °· 6 hours before the procedure - stop eating light meals or foods, such as toast or cereal. °· 6 hours before the procedure - stop drinking milk or drinks that contain milk. °· 2 hours before the procedure - stop drinking clear liquids. °Medicines °· Ask your health care provider about: °? Changing or stopping your regular medicines. This is especially important if you are taking diabetes medicines or blood thinners. °? Taking medicines such as aspirin and ibuprofen. These medicines can thin your blood. Do not take these medicines before your procedure if your health care provider instructs you not to. °· You may be given antibiotic medicine to help prevent infection. °General instructions °· You will have a heart evaluation. This may include an electrocardiogram (ECG), chest X-ray, and heart imaging (echocardiogram,  or echo) tests. °· You will have blood   tests. °· Do not use any products that contain nicotine or tobacco, such as cigarettes and e-cigarettes. If you need help quitting, ask your health care provider. °· Plan to have someone take you home from the hospital or clinic. °· If you will be going home right after  the procedure, plan to have someone with you for 24 hours. °· Ask your health care provider how your surgical site will be marked or identified. °What happens during the procedure? °· To reduce your risk of infection: °? Your health care team will wash or sanitize their hands. °? Your skin will be washed with soap. °? Hair may be removed from the surgical area. °· An IV tube will be inserted into one of your veins. °· You will be given one or more of the following: °? A medicine to help you relax (sedative). °? A medicine to numb the area (local anesthetic). °? A medicine to make you fall asleep (general anesthetic). °· If you are getting a transvenous pacemaker: °? An incision will be made in your upper chest. °? A pocket will be made for the pacemaker. It may be placed under the skin or between layers of muscle. °? The lead will be inserted into a blood vessel that returns to the heart. °? While X-rays are taken by an imaging machine (fluoroscopy), the lead will be advanced through the vein to the inside of your heart. °? The other end of the lead will be tunneled under the skin and attached to the pacemaker. °· If you are getting an epicardial pacemaker: °? An incision will be made near your ribs or breastbone (sternum) for the lead. °? The lead will be attached to the outside of your heart. °? Another incision will be made in your chest or upper belly to create a pocket for the pacemaker. °? The free end of the lead will be tunneled under the skin and attached to the pacemaker. °· The transvenous or epicardial pacemaker will be tested. Imaging studies may be done to check the lead position. °· The incisions will be closed with stitches (sutures), adhesive strips, or skin glue. °· Bandages (dressing) will be placed over the incisions. °The procedure may vary among health care providers and hospitals. °What happens after the procedure? °· Your blood pressure, heart rate, breathing rate, and blood oxygen level will  be monitored until the medicines you were given have worn off. °· You will be given antibiotics and pain medicine. °· ECG and chest x-rays will be done. °· You will wear a continuous type of ECG (Holter monitor) to check your heart rhythm. °· Your health care provider will program the pacemaker. °· Do not drive for 24 hours if you received a sedative. °This information is not intended to replace advice given to you by your health care provider. Make sure you discuss any questions you have with your health care provider. °Document Revised: 09/11/2017 Document Reviewed: 06/06/2015 °Elsevier Patient Education © 2020 [employer] ° ° °

## 2019-07-05 NOTE — Progress Notes (Signed)
HPI Allison Norman is referred today by the atrial fib clinic for evaluation of symptomatic tachy-brady syndrome. She has a long h/o atrial fib and has been treated with amiodarone. She wore a cardiac monitor due to fatigue and dizziness and was noted to have daytime bradycardia with HR's in the 20's and 30's and pauses of 4 seconds. She also has atrial fib/flutter with a RVR and HR's in the 120's. She has not had syncope.  Allergies  Allergen Reactions  . Codeine Itching  . Penicillins Itching, Rash and Other (See Comments)    Has patient had a PCN reaction causing immediate rash, facial/tongue/throat swelling, SOB or lightheadedness with hypotension: Yes Has patient had a PCN reaction causing severe rash involving mucus membranes or skin necrosis: No Has patient had a PCN reaction that required hospitalization: No Has patient had a PCN reaction occurring within the last 10 years: No If all of the above answers are "NO", then may proceed with Cephalosporin use.  . Doxazosin     Heavy feeling in chest, congestion, wheezing  . Doxycycline Other (See Comments)    Cause chest tightness  . Hydralazine     Felt bad and could hear pulse in head      Current Outpatient Medications  Medication Sig Dispense Refill  . amiodarone (PACERONE) 200 MG tablet Take 0.5 tablets (100 mg total) by mouth daily. 90 tablet 2  . amLODipine (NORVASC) 5 MG tablet Take 1 tablet (5 mg total) by mouth daily. 90 tablet 3  . b complex vitamins tablet Take 1 tablet by mouth daily.    . bacitracin-polymyxin b (POLYSPORIN) ophthalmic ointment Place 1 application into the left eye at bedtime. Place a 1/2 inch ribbon of ointment into the lower eyelid. 10.5 g 11  . Bevacizumab (AVASTIN IV) Eye injection every 6 weeks    . cloNIDine (CATAPRES) 0.2 MG tablet Take 1 tablet (0.2 mg total) by mouth 3 (three) times daily. 270 tablet 3  . diazepam (VALIUM) 2 MG tablet Take 2 mg by mouth at bedtime as needed (for sleep).   0    . diltiazem (CARDIZEM) 30 MG tablet Take 30 mg by mouth 2 (two) times daily as needed (as needed for heart rate greater than 100).    [name] ELIQUIS 2.5 MG TABS tablet Take 1 tablet by mouth twice daily 180 tablet 0  . furosemide (LASIX) 20 MG tablet Take 1 tablet (20 mg total) by mouth daily. 30 tablet 1  . losartan (COZAAR) 100 MG tablet Take 100 mg by mouth daily. Per PCP     . minoxidil (LONITEN) 2.5 MG tablet Take 1 tablet (2.5 mg total) by mouth 2 (two) times daily. 60 tablet 1  . Multiple Vitamins-Minerals (PRESERVISION AREDS 2) CAPS Take 1 capsule by mouth daily.    . predniSONE (DELTASONE) 5 MG tablet Take 5 mg by mouth 3 (three) times a week.   0  . carvedilol (COREG) 3.125 MG tablet Take 1 tablet (3.125 mg total) by mouth 2 (two) times daily. 60 tablet 3   No current facility-administered medications for this visit.     Past Medical History:  Diagnosis Date  . Atrial fibrillation ([place]) 2017   a. s/p DCCV in 10/2015  b. recurrent in 08/2016 --> rate-control pursued.   . Cancer ([place])    Breast  . CKD (chronic kidney disease)   . GCA (giant cell arteritis) ([place])   . Hypertension   . Macular degeneration   .  Osteoporosis   . PMR (polymyalgia rheumatica) ([facility])   . Resistant hypertension 03/15/2019  . Shortness of breath 03/15/2019  . Skin cancer     ROS:   All systems reviewed and negative except as noted in the HPI.   Past Surgical History:  Procedure Laterality Date  . CARDIOVERSION N/A 10/18/2015   Procedure: CARDIOVERSION;  Surgeon: Jerline Pain, MD;  Location: [place];  Service: Cardiovascular;  Laterality: N/A;  . CARDIOVERSION N/A 02/20/2017   Procedure: CARDIOVERSION;  Surgeon: Pixie Casino, MD;  Location: [facility] ENDOSCOPY;  Service: Cardiovascular;  Laterality: N/A;  . CARDIOVERSION N/A 06/18/2017   Procedure: CARDIOVERSION;  Surgeon: Sanda Klein, MD;  Location: [place] ENDOSCOPY;  Service: Cardiovascular;  Laterality: N/A;  . CARDIOVERSION N/A 12/21/2018    Procedure: CARDIOVERSION;  Surgeon: Thayer Headings, MD;  Location: [facility] ENDOSCOPY;  Service: Cardiovascular;  Laterality: N/A;  . KNEE SURGERY  2013  . MASTECTOMY  1998   left side     Family History  Problem Relation Age of Onset  . Stroke Mother   . Kidney failure Father        died at 74  . Heart disease Sister        died at 99  . Heart disease Sister        died at 75  . Breast cancer Daughter      Social History   Socioeconomic History  . Marital status: Married    Spouse name: Not on file  . Number of children: 2  . Years of education: Not on file  . Highest education level: Not on file  Occupational History  . Not on file  Tobacco Use  . Smoking status: Former Smoker    Quit date: 08/31/1966    Years since quitting: 52.8  . Smokeless tobacco: Never Used  Vaping Use  . Vaping Use: Never used  Substance and Sexual Activity  . Alcohol use: No  . Drug use: No  . Sexual activity: Not on file  Other Topics Concern  . Not on file  Social History Narrative   epworth sleepiness scale = 8 (08/31/15)   Social Determinants of Health   Financial Resource Strain:   . Difficulty of Paying Living Expenses:   Food Insecurity:   . Worried About Charity fundraiser in the Last Year:   . Arboriculturist in the Last Year:   Transportation Needs:   . Film/video editor (Medical):   [name] Lack of Transportation (Non-Medical):   Physical Activity:   . Days of Exercise per Week:   . Minutes of Exercise per Session:   Stress:   . Feeling of Stress :   Social Connections:   . Frequency of Communication with Friends and Family:   . Frequency of Social Gatherings with Friends and Family:   . Attends Religious Services:   . Active Member of Clubs or Organizations:   . Attends Archivist Meetings:   [name] Marital Status:   Intimate Partner Violence:   . Fear of Current or Ex-Partner:   . Emotionally Abused:   [name] Physically Abused:   . Sexually Abused:      BP  (!) 164/68   Pulse (!) 51   Ht 5' 5.5" (1.664 m)   Wt 151 lb 9.6 oz (68.8 kg)   SpO2 90%   BMI 24.84 kg/m   Physical Exam:  Well appearing elderly woman, NAD HEENT: Unremarkable Neck:  No JVD, no thyromegally  Lymphatics:  No adenopathy Back:  No CVA tenderness Lungs:  Clear with no wheezes HEART:  Regular rate rhythm, no murmurs, no rubs, no clicks Abd:  soft, positive bowel sounds, no organomegally, no rebound, no guarding Ext:  2 plus pulses, no edema, no cyanosis, no clubbing Skin:  No rashes no nodules Neuro:  CN II through XII intact, motor grossly intact  EKG - sinus brady with first degree AV block   Assess/Plan: 1. Symptomatic tachy-brady syndrome - she has both too fast and too slow. I have recommended insertion of a PPM. The risks/benefits/goals/expectations of the procedure were reviewed and she wishes to proceed. 2. PAF - she is on low dose amio and I will expect that she will need more medication. 3. HTN - her bp is up today but she did not take her morning meds. 4. Coags - she has had no bleeding on low dose eliquis.  Mikle Bosworth.D.

## 2019-07-05 NOTE — H&P (View-Only) (Signed)
HPI Allison Norman is referred today by the atrial fib clinic for evaluation of symptomatic tachy-brady syndrome. She has a long h/o atrial fib and has been treated with amiodarone. She wore a cardiac monitor due to fatigue and dizziness and was noted to have daytime bradycardia with HR's in the 20's and 30's and pauses of 4 seconds. She also has atrial fib/flutter with a RVR and HR's in the 120's. She has not had syncope.  Allergies  Allergen Reactions  . Codeine Itching  . Penicillins Itching, Rash and Other (See Comments)    Has patient had a PCN reaction causing immediate rash, facial/tongue/throat swelling, SOB or lightheadedness with hypotension: Yes Has patient had a PCN reaction causing severe rash involving mucus membranes or skin necrosis: No Has patient had a PCN reaction that required hospitalization: No Has patient had a PCN reaction occurring within the last 10 years: No If all of the above answers are "NO", then may proceed with Cephalosporin use.  . Doxazosin     Heavy feeling in chest, congestion, wheezing  . Doxycycline Other (See Comments)    Cause chest tightness  . Hydralazine     Felt bad and could hear pulse in head      Current Outpatient Medications  Medication Sig Dispense Refill  . amiodarone (PACERONE) 200 MG tablet Take 0.5 tablets (100 mg total) by mouth daily. 90 tablet 2  . amLODipine (NORVASC) 5 MG tablet Take 1 tablet (5 mg total) by mouth daily. 90 tablet 3  . b complex vitamins tablet Take 1 tablet by mouth daily.    . bacitracin-polymyxin b (POLYSPORIN) ophthalmic ointment Place 1 application into the left eye at bedtime. Place a 1/2 inch ribbon of ointment into the lower eyelid. 10.5 g 11  . Bevacizumab (AVASTIN IV) Eye injection every 6 weeks    . cloNIDine (CATAPRES) 0.2 MG tablet Take 1 tablet (0.2 mg total) by mouth 3 (three) times daily. 270 tablet 3  . diazepam (VALIUM) 2 MG tablet Take 2 mg by mouth at bedtime as needed (for sleep).   0    . diltiazem (CARDIZEM) 30 MG tablet Take 30 mg by mouth 2 (two) times daily as needed (as needed for heart rate greater than 100).    [name] ELIQUIS 2.5 MG TABS tablet Take 1 tablet by mouth twice daily 180 tablet 0  . furosemide (LASIX) 20 MG tablet Take 1 tablet (20 mg total) by mouth daily. 30 tablet 1  . losartan (COZAAR) 100 MG tablet Take 100 mg by mouth daily. Per PCP     . minoxidil (LONITEN) 2.5 MG tablet Take 1 tablet (2.5 mg total) by mouth 2 (two) times daily. 60 tablet 1  . Multiple Vitamins-Minerals (PRESERVISION AREDS 2) CAPS Take 1 capsule by mouth daily.    . predniSONE (DELTASONE) 5 MG tablet Take 5 mg by mouth 3 (three) times a week.   0  . carvedilol (COREG) 3.125 MG tablet Take 1 tablet (3.125 mg total) by mouth 2 (two) times daily. 60 tablet 3   No current facility-administered medications for this visit.     Past Medical History:  Diagnosis Date  . Atrial fibrillation ([place]) 2017   a. s/p DCCV in 10/2015  b. recurrent in 08/2016 --> rate-control pursued.   . Cancer ([place])    Breast  . CKD (chronic kidney disease)   . GCA (giant cell arteritis) ([place])   . Hypertension   . Macular degeneration   .  Osteoporosis   . PMR (polymyalgia rheumatica) ([facility])   . Resistant hypertension 03/15/2019  . Shortness of breath 03/15/2019  . Skin cancer     ROS:   All systems reviewed and negative except as noted in the HPI.   Past Surgical History:  Procedure Laterality Date  . CARDIOVERSION N/A 10/18/2015   Procedure: CARDIOVERSION;  Surgeon: Jerline Pain, MD;  Location: [place];  Service: Cardiovascular;  Laterality: N/A;  . CARDIOVERSION N/A 02/20/2017   Procedure: CARDIOVERSION;  Surgeon: Pixie Casino, MD;  Location: [facility] ENDOSCOPY;  Service: Cardiovascular;  Laterality: N/A;  . CARDIOVERSION N/A 06/18/2017   Procedure: CARDIOVERSION;  Surgeon: Sanda Klein, MD;  Location: [facility] ENDOSCOPY;  Service: Cardiovascular;  Laterality: N/A;  . CARDIOVERSION N/A 12/21/2018    Procedure: CARDIOVERSION;  Surgeon: Thayer Headings, MD;  Location: [facility] ENDOSCOPY;  Service: Cardiovascular;  Laterality: N/A;  . KNEE SURGERY  2013  . MASTECTOMY  1998   left side     Family History  Problem Relation Age of Onset  . Stroke Mother   . Kidney failure Father        died at 3  . Heart disease Sister        died at 75  . Heart disease Sister        died at 24  . Breast cancer Daughter      Social History   Socioeconomic History  . Marital status: Married    Spouse name: Not on file  . Number of children: 2  . Years of education: Not on file  . Highest education level: Not on file  Occupational History  . Not on file  Tobacco Use  . Smoking status: Former Smoker    Quit date: 08/31/1966    Years since quitting: 52.8  . Smokeless tobacco: Never Used  Vaping Use  . Vaping Use: Never used  Substance and Sexual Activity  . Alcohol use: No  . Drug use: No  . Sexual activity: Not on file  Other Topics Concern  . Not on file  Social History Narrative   epworth sleepiness scale = 8 (08/31/15)   Social Determinants of Health   Financial Resource Strain:   . Difficulty of Paying Living Expenses:   Food Insecurity:   . Worried About Charity fundraiser in the Last Year:   . Arboriculturist in the Last Year:   Transportation Needs:   . Film/video editor (Medical):   [name] Lack of Transportation (Non-Medical):   Physical Activity:   . Days of Exercise per Week:   . Minutes of Exercise per Session:   Stress:   . Feeling of Stress :   Social Connections:   . Frequency of Communication with Friends and Family:   . Frequency of Social Gatherings with Friends and Family:   . Attends Religious Services:   . Active Member of Clubs or Organizations:   . Attends Archivist Meetings:   [name] Marital Status:   Intimate Partner Violence:   . Fear of Current or Ex-Partner:   . Emotionally Abused:   [name] Physically Abused:   . Sexually Abused:      BP  (!) 164/68   Pulse (!) 51   Ht 5' 5.5" (1.664 m)   Wt 151 lb 9.6 oz (68.8 kg)   SpO2 90%   BMI 24.84 kg/m   Physical Exam:  Well appearing elderly woman, NAD HEENT: Unremarkable Neck:  No JVD, no thyromegally  Lymphatics:  No adenopathy Back:  No CVA tenderness Lungs:  Clear with no wheezes HEART:  Regular rate rhythm, no murmurs, no rubs, no clicks Abd:  soft, positive bowel sounds, no organomegally, no rebound, no guarding Ext:  2 plus pulses, no edema, no cyanosis, no clubbing Skin:  No rashes no nodules Neuro:  CN II through XII intact, motor grossly intact  EKG - sinus brady with first degree AV block   Assess/Plan: 1. Symptomatic tachy-brady syndrome - she has both too fast and too slow. I have recommended insertion of a PPM. The risks/benefits/goals/expectations of the procedure were reviewed and she wishes to proceed. 2. PAF - she is on low dose amio and I will expect that she will need more medication. 3. HTN - her bp is up today but she did not take her morning meds. 4. Coags - she has had no bleeding on low dose eliquis.  Mikle Bosworth.D.

## 2019-07-07 ENCOUNTER — Other Ambulatory Visit: Payer: Self-pay | Admitting: Cardiology

## 2019-07-15 ENCOUNTER — Telehealth: Payer: Self-pay | Admitting: Internal Medicine

## 2019-07-15 DIAGNOSIS — M179 Osteoarthritis of knee, unspecified: Secondary | ICD-10-CM | POA: Diagnosis not present

## 2019-07-15 DIAGNOSIS — G4700 Insomnia, unspecified: Secondary | ICD-10-CM | POA: Diagnosis not present

## 2019-07-15 DIAGNOSIS — I4891 Unspecified atrial fibrillation: Secondary | ICD-10-CM | POA: Diagnosis not present

## 2019-07-15 DIAGNOSIS — E781 Pure hyperglyceridemia: Secondary | ICD-10-CM | POA: Diagnosis not present

## 2019-07-15 DIAGNOSIS — I10 Essential (primary) hypertension: Secondary | ICD-10-CM | POA: Diagnosis not present

## 2019-07-15 DIAGNOSIS — D649 Anemia, unspecified: Secondary | ICD-10-CM | POA: Diagnosis not present

## 2019-07-15 DIAGNOSIS — N184 Chronic kidney disease, stage 4 (severe): Secondary | ICD-10-CM | POA: Diagnosis not present

## 2019-07-15 DIAGNOSIS — N183 Chronic kidney disease, stage 3 unspecified: Secondary | ICD-10-CM | POA: Diagnosis not present

## 2019-07-15 DIAGNOSIS — Z853 Personal history of malignant neoplasm of breast: Secondary | ICD-10-CM | POA: Diagnosis not present

## 2019-07-15 NOTE — Telephone Encounter (Signed)
Allison Norman is calling stating she is having trouble sleeping due to the pulse in her head and would like to know what Dr. Lovena Le advises in regards to it. Please advise.

## 2019-07-15 NOTE — Telephone Encounter (Signed)
Spoke with Pt.  If "pulse" feeling in her head is related to high heart rate or blood pressure advised that we could not increase any of her medications until she got her pacemaker.  She will try to take some tylenol at bedtime to see if that helps.

## 2019-07-22 ENCOUNTER — Ambulatory Visit (HOSPITAL_COMMUNITY): Payer: Medicare Other

## 2019-07-22 ENCOUNTER — Other Ambulatory Visit: Payer: Self-pay

## 2019-07-22 ENCOUNTER — Ambulatory Visit (HOSPITAL_COMMUNITY)
Admission: RE | Admit: 2019-07-22 | Discharge: 2019-07-22 | Disposition: A | Discharge status: Home / Self Care | Payer: Medicare Other | Attending: Internal Medicine | Admitting: Internal Medicine

## 2019-07-22 ENCOUNTER — Ambulatory Visit (HOSPITAL_COMMUNITY)
Admission: RE | Disposition: A | Discharge status: Home / Self Care | Payer: Self-pay | Source: Home / Self Care | Attending: Internal Medicine

## 2019-07-22 DIAGNOSIS — Z79899 Other long term (current) drug therapy: Secondary | ICD-10-CM | POA: Insufficient documentation

## 2019-07-22 DIAGNOSIS — J90 Pleural effusion, not elsewhere classified: Secondary | ICD-10-CM | POA: Diagnosis not present

## 2019-07-22 DIAGNOSIS — Z7901 Long term (current) use of anticoagulants: Secondary | ICD-10-CM | POA: Insufficient documentation

## 2019-07-22 DIAGNOSIS — Z885 Allergy status to narcotic agent status: Secondary | ICD-10-CM | POA: Insufficient documentation

## 2019-07-22 DIAGNOSIS — I480 Paroxysmal atrial fibrillation: Secondary | ICD-10-CM | POA: Diagnosis not present

## 2019-07-22 DIAGNOSIS — N189 Chronic kidney disease, unspecified: Secondary | ICD-10-CM | POA: Insufficient documentation

## 2019-07-22 DIAGNOSIS — Z87891 Personal history of nicotine dependence: Secondary | ICD-10-CM | POA: Diagnosis not present

## 2019-07-22 DIAGNOSIS — I4892 Unspecified atrial flutter: Secondary | ICD-10-CM | POA: Insufficient documentation

## 2019-07-22 DIAGNOSIS — Z880 Allergy status to penicillin: Secondary | ICD-10-CM | POA: Insufficient documentation

## 2019-07-22 DIAGNOSIS — I495 Sick sinus syndrome: Secondary | ICD-10-CM | POA: Diagnosis not present

## 2019-07-22 DIAGNOSIS — Z853 Personal history of malignant neoplasm of breast: Secondary | ICD-10-CM | POA: Diagnosis not present

## 2019-07-22 DIAGNOSIS — J9811 Atelectasis: Secondary | ICD-10-CM | POA: Diagnosis not present

## 2019-07-22 DIAGNOSIS — Z7952 Long term (current) use of systemic steroids: Secondary | ICD-10-CM | POA: Diagnosis not present

## 2019-07-22 DIAGNOSIS — M353 Polymyalgia rheumatica: Secondary | ICD-10-CM | POA: Diagnosis not present

## 2019-07-22 DIAGNOSIS — Z881 Allergy status to other antibiotic agents status: Secondary | ICD-10-CM | POA: Insufficient documentation

## 2019-07-22 DIAGNOSIS — Z950 Presence of cardiac pacemaker: Secondary | ICD-10-CM

## 2019-07-22 DIAGNOSIS — I129 Hypertensive chronic kidney disease with stage 1 through stage 4 chronic kidney disease, or unspecified chronic kidney disease: Secondary | ICD-10-CM | POA: Diagnosis not present

## 2019-07-22 DIAGNOSIS — I517 Cardiomegaly: Secondary | ICD-10-CM | POA: Diagnosis not present

## 2019-07-22 HISTORY — PX: PACEMAKER IMPLANT: EP1218

## 2019-07-22 SURGERY — PACEMAKER IMPLANT

## 2019-07-22 MED ORDER — SODIUM CHLORIDE 0.9 % IV SOLN
80.0000 mg | INTRAVENOUS | Status: AC
  Administered 2019-07-22: 80 mg
  Filled 2019-07-22: qty 2

## 2019-07-22 MED ORDER — FENTANYL CITRATE (PF) 100 MCG/2ML IJ SOLN
INTRAMUSCULAR | Status: DC | PRN
  Administered 2019-07-22 (×2): 12.5 ug via INTRAVENOUS

## 2019-07-22 MED ORDER — SODIUM CHLORIDE 0.9 % IV SOLN: INTRAVENOUS | Status: DC

## 2019-07-22 MED ORDER — ACETAMINOPHEN 325 MG PO TABS
325.0000 mg | ORAL_TABLET | ORAL | Status: DC | PRN
  Filled 2019-07-22: qty 2

## 2019-07-22 MED ORDER — SODIUM CHLORIDE 0.9 % IV SOLN
INTRAVENOUS | Status: AC
  Filled 2019-07-22: qty 2

## 2019-07-22 MED ORDER — ONDANSETRON HCL 4 MG/2ML IJ SOLN: 4.0000 mg | Freq: Four times a day (QID) | INTRAMUSCULAR | Status: DC | PRN

## 2019-07-22 MED ORDER — MIDAZOLAM HCL 5 MG/5ML IJ SOLN
INTRAMUSCULAR | Status: DC | PRN
  Administered 2019-07-22 (×2): 1 mg via INTRAVENOUS

## 2019-07-22 MED ORDER — LIDOCAINE HCL 1 % IJ SOLN
INTRAMUSCULAR | Status: AC
  Filled 2019-07-22: qty 60

## 2019-07-22 MED ORDER — HEPARIN (PORCINE) IN NACL 1000-0.9 UT/500ML-% IV SOLN
INTRAVENOUS | Status: DC | PRN
  Administered 2019-07-22: 500 mL

## 2019-07-22 MED ORDER — VANCOMYCIN HCL IN DEXTROSE 1-5 GM/200ML-% IV SOLN: 1000.0000 mg | Freq: Two times a day (BID) | INTRAVENOUS | Status: DC

## 2019-07-22 MED ORDER — HEPARIN (PORCINE) IN NACL 1000-0.9 UT/500ML-% IV SOLN
INTRAVENOUS | Status: AC
  Filled 2019-07-22: qty 500

## 2019-07-22 MED ORDER — CHLORHEXIDINE GLUCONATE 4 % EX LIQD: 4.0000 "application " | Freq: Once | CUTANEOUS | Status: DC

## 2019-07-22 MED ORDER — FENTANYL CITRATE (PF) 100 MCG/2ML IJ SOLN
INTRAMUSCULAR | Status: AC
  Filled 2019-07-22: qty 2

## 2019-07-22 MED ORDER — VANCOMYCIN HCL IN DEXTROSE 1-5 GM/200ML-% IV SOLN
1000.0000 mg | INTRAVENOUS | Status: AC
  Administered 2019-07-22: 1000 mg via INTRAVENOUS

## 2019-07-22 MED ORDER — LIDOCAINE HCL (PF) 1 % IJ SOLN
INTRAMUSCULAR | Status: DC | PRN
  Administered 2019-07-22: 50 mL

## 2019-07-22 MED ORDER — VANCOMYCIN HCL IN DEXTROSE 1-5 GM/200ML-% IV SOLN
INTRAVENOUS | Status: AC
  Filled 2019-07-22: qty 200

## 2019-07-22 MED ORDER — MIDAZOLAM HCL 5 MG/5ML IJ SOLN
INTRAMUSCULAR | Status: AC
  Filled 2019-07-22: qty 5

## 2019-07-22 SURGICAL SUPPLY — 8 items
CABLE SURGICAL S-101-97-12 (CABLE) ×3 IMPLANT
KIT MICROPUNCTURE NIT STIFF (SHEATH) ×3 IMPLANT
LEAD INGEVITY 7840 45 (Lead) ×2 IMPLANT
LEAD INGEVITY 7841 52 (Lead) ×3 IMPLANT
PACEMAKER ACCOLADE GR (Pacemaker) ×2 IMPLANT
PAD PRO RADIOLUCENT 2001M-C (PAD) ×3 IMPLANT
SHEATH 7FR PRELUDE SNAP 13 (SHEATH) ×6 IMPLANT
TRAY PACEMAKER INSERTION (PACKS) ×3 IMPLANT

## 2019-07-22 NOTE — Progress Notes (Signed)
Pt is post pacemaker. Her crcl<30 and got pre-op vanc this AM at ~8. She will not need post op vanc. FYI Dr. Lovena Le.  Onnie Boer, PharmD, BCIDP, AAHIVP, CPP Infectious Disease Pharmacist 07/22/2019 10:23 AM

## 2019-07-22 NOTE — Progress Notes (Addendum)
O2 sat 86% on Ra placed on 2 liters sat 93 Pt states she drops at times pure wick placed pt able to void clear yellow urine

## 2019-07-22 NOTE — Progress Notes (Addendum)
Discharge instructions reviewed with pt and her  daughter (via telephone) voices understanding.

## 2019-07-22 NOTE — Interval H&P Note (Signed)
History and Physical Interval Note:  07/22/2019 7:36 AM  Allison Norman  has presented today for surgery, with the diagnosis of bradicardia.  The various methods of treatment have been discussed with the patient and family. After consideration of risks, benefits and other options for treatment, the patient has consented to  Procedure(s): PACEMAKER IMPLANT (N/A) as a surgical intervention.  The patient's history has been reviewed, patient examined, no change in status, stable for surgery.  I have reviewed the patient's chart and labs.  Questions were answered to the patient's satisfaction.     Allison Norman

## 2019-07-22 NOTE — Discharge Instructions (Signed)
    Supplemental Discharge Instructions for  Pacemaker/Defibrillator Patients  Tomorrow, 07/23/2019, PLEASE SEND A REMOTE DEVICE TRANSMISSION     Activity No heavy lifting or vigorous activity with your left/right arm for 6 to 8 weeks.  Do not raise your left/right arm above your head for one week.  Gradually raise your affected arm as drawn below.             07/26/2019                 07/27/2019               07/28/2019                07/29/2019 __  NO DRIVING for  1 week   ; you may begin driving on  08/23/5629   .  WOUND CARE - Keep the wound area clean and dry.  Do not get this area wet for one week. No showers for one week; you may shower on 07/29/2019 - Tomorrow, 07/23/2019, remove the arm sling - Tomorrow, 08/02/2019, remove the outer plastic bandage.  Underneath there are steri-strips (small paper tapes) covering the wound, DO NOT remove these. - The tape/steri-strips on your wound will fall off; do not pull them off.  No bandage is needed on the site.  DO  NOT apply any creams, oils, or ointments to the wound area. - If you notice any drainage or discharge from the wound, any swelling or bruising at the site, or you develop a fever > 101? F after you are discharged home, call the office at once.  Special Instructions - You are still able to use cellular telephones; use the ear opposite the side where you have your pacemaker/defibrillator.  Avoid carrying your cellular phone near your device. - When traveling through airports, show security personnel your identification card to avoid being screened in the metal detectors.  Ask the security personnel to use the hand wand. - Avoid arc welding equipment, MRI testing (magnetic resonance imaging), TENS units (transcutaneous nerve stimulators).  Call the office for questions about other devices. - Avoid electrical appliances that are in poor condition or are not properly grounded. - Microwave ovens are safe to be near or to operate.

## 2019-07-23 ENCOUNTER — Other Ambulatory Visit: Payer: Self-pay | Admitting: Cardiovascular Disease

## 2019-07-25 ENCOUNTER — Encounter (HOSPITAL_COMMUNITY): Payer: Self-pay | Admitting: Internal Medicine

## 2019-07-25 MED FILL — Lidocaine HCl Local Inj 1%: INTRAMUSCULAR | Qty: 60 | Status: AC

## 2019-07-26 ENCOUNTER — Telehealth: Payer: Self-pay | Admitting: Internal Medicine

## 2019-07-26 NOTE — Progress Notes (Signed)
SDD f/u phone call: No answer on home phone, no vm option. Called cell phone number listed and pt's daughter answered. Stated pt was sore first couple days after PPM implant but feeling better now. Also stated pt's HR was 105. She's concerned about this. Instructed daughter to call EP office with any concerns

## 2019-07-26 NOTE — Telephone Encounter (Signed)
STAT if HR is under 50 or over 120 (normal HR is 60-100 beats per minute)  1) What is your heart rate? 110  2) Do you have a log of your heart rate readings (document readings)?  60 7/17 Every day since then, HR has been over 100   Do you have any other symptoms? Only advised of soreness from where pacemaker was put in.    Patient thinks her HR being over 100 is too high. She said someone told her that her HR would be any where from 60-100.

## 2019-07-26 NOTE — Telephone Encounter (Signed)
Assisted pt with sending manual transmission.    HR noted to be ASVS 108.  Advised that HR is intrinsic, it not pacing that high. Pt is asymptomatic, she noticed this on her BP cuff.  Spoke with pt daughter as well advised to continue to monitor notify if HR does not improve.

## 2019-08-04 ENCOUNTER — Other Ambulatory Visit: Payer: Self-pay | Admitting: Cardiology

## 2019-08-04 ENCOUNTER — Ambulatory Visit (INDEPENDENT_AMBULATORY_CARE_PROVIDER_SITE_OTHER): Payer: Medicare Other | Admitting: Emergency Medicine

## 2019-08-04 ENCOUNTER — Other Ambulatory Visit: Payer: Self-pay

## 2019-08-04 ENCOUNTER — Other Ambulatory Visit: Payer: Self-pay | Admitting: Internal Medicine

## 2019-08-04 DIAGNOSIS — I4891 Unspecified atrial fibrillation: Secondary | ICD-10-CM

## 2019-08-04 DIAGNOSIS — I495 Sick sinus syndrome: Secondary | ICD-10-CM

## 2019-08-05 ENCOUNTER — Telehealth: Payer: Self-pay | Admitting: Internal Medicine

## 2019-08-05 DIAGNOSIS — I495 Sick sinus syndrome: Secondary | ICD-10-CM | POA: Insufficient documentation

## 2019-08-05 LAB — CUP PACEART INCLINIC DEVICE CHECK
Brady Statistic RA Percent Paced: 9 %
Brady Statistic RV Percent Paced: 1 % — CL
Date Time Interrogation Session: 20210729000000
Implantable Lead Implant Date: 20210716
Implantable Lead Implant Date: 20210716
Implantable Lead Location: 753859
Implantable Lead Location: 753860
Implantable Lead Model: 7840
Implantable Lead Model: 7841
Implantable Lead Serial Number: 1017229
Implantable Lead Serial Number: 1090224
Implantable Pulse Generator Implant Date: 20210716
Lead Channel Impedance Value: 677 Ohm
Lead Channel Impedance Value: 772 Ohm
Lead Channel Pacing Threshold Amplitude: 1 V
Lead Channel Pacing Threshold Amplitude: 1.2 V
Lead Channel Pacing Threshold Pulse Width: 0.4 ms
Lead Channel Pacing Threshold Pulse Width: 0.4 ms
Lead Channel Sensing Intrinsic Amplitude: 18 mV
Lead Channel Sensing Intrinsic Amplitude: 4.7 mV
Lead Channel Setting Pacing Amplitude: 3.5 V
Lead Channel Setting Pacing Amplitude: 3.5 V
Lead Channel Setting Pacing Pulse Width: 0.4 ms
Lead Channel Setting Sensing Sensitivity: 2.5 mV
Pulse Gen Serial Number: 547553

## 2019-08-05 NOTE — Telephone Encounter (Signed)
°*  STAT* If patient is at the pharmacy, call can be transferred to refill team.   1. Which medications need to be refilled? (please list name of each medication and dose if known) furosemide (LASIX) 20 MG tablet  2. Which pharmacy/location (including street and city if local pharmacy) is medication to be sent to? [place], [place]  3. Do they need a 30 day or 90 day supply? 90 day supply

## 2019-08-05 NOTE — Progress Notes (Signed)
Wound check appointment. Steri-strips removed. Wound without redness or edema. Incision edges approximated, wound well healed. Device check completed with industry.   Normal device function. Thresholds, sensing, and impedances consistent with implant measurements. Device programmed at 3.5V for extra safety margin until 3 month visit. Histogram distribution appropriate for patient and level of activity. No mode switches or high ventricular rates noted. Patient educated about wound care, arm mobility, lifting restrictions. ROV with Dr. Lovena Le on 11/01/19.  Device is enrolled in remote monitoring, next scheduled transmission 10/21/19.

## 2019-08-08 MED ORDER — FUROSEMIDE 20 MG PO TABS: 20.0000 mg | ORAL_TABLET | Freq: Every day | ORAL | 3 refills | Status: DC

## 2019-08-08 NOTE — Telephone Encounter (Signed)
Pt's medication was sent to pt's pharmacy as requested. Confirmation received.  °

## 2019-08-16 ENCOUNTER — Other Ambulatory Visit: Payer: Self-pay | Admitting: Internal Medicine

## 2019-08-22 ENCOUNTER — Other Ambulatory Visit: Payer: Self-pay | Admitting: Cardiovascular Disease

## 2019-08-23 ENCOUNTER — Encounter (INDEPENDENT_AMBULATORY_CARE_PROVIDER_SITE_OTHER): Payer: Self-pay | Admitting: Ophthalmology

## 2019-08-23 ENCOUNTER — Other Ambulatory Visit: Payer: Self-pay

## 2019-08-23 ENCOUNTER — Ambulatory Visit (INDEPENDENT_AMBULATORY_CARE_PROVIDER_SITE_OTHER): Payer: Medicare Other | Admitting: Ophthalmology

## 2019-08-23 DIAGNOSIS — H353124 Nonexudative age-related macular degeneration, left eye, advanced atrophic with subfoveal involvement: Secondary | ICD-10-CM

## 2019-08-23 DIAGNOSIS — H353211 Exudative age-related macular degeneration, right eye, with active choroidal neovascularization: Secondary | ICD-10-CM

## 2019-08-23 MED ORDER — AFLIBERCEPT 2MG/0.05ML IZ SOLN FOR KALEIDOSCOPE
2.0000 mg | INTRAVITREAL | Status: AC | PRN
  Administered 2019-08-23: 2 mg via INTRAVITREAL

## 2019-08-23 NOTE — Assessment & Plan Note (Signed)
On Eylea, condition is stabilized yet chronic activity nasal to the fovea overlying pigment epithelial detachment, vascularized

## 2019-08-23 NOTE — Progress Notes (Signed)
08/23/2019     CHIEF COMPLAINT Patient presents for Retina Follow Up   HISTORY OF PRESENT ILLNESS: Allison Norman is a 86 y.o. female who presents to the clinic today for:   HPI    Retina Follow Up    Patient presents with  Wet AMD.  In right eye.  This started 8 weeks ago.  Severity is mild.  Duration of 8 weeks.  Since onset it is stable.          Comments    8 Week AMD F/U OD, poss Eylea OD  Pt c/o difficulty seeing small things up close. Pt denies any other changes to [place] OU.       Last edited by Rockie Neighbours, [place] on 08/23/2019  2:32 PM. (History)      Referring physician: [name], MD [place],  [place] [address]  HISTORICAL INFORMATION:   Selected notes from the MEDICAL RECORD NUMBER       CURRENT MEDICATIONS: Current Outpatient Medications (Ophthalmic Drugs)  Medication Sig   bacitracin-polymyxin b (POLYSPORIN) ophthalmic ointment Place 1 application into the left eye at bedtime. Place a 1/2 inch ribbon of ointment into the lower eyelid. (Patient not taking: Reported on 07/11/2019)   No current facility-administered medications for this visit. (Ophthalmic Drugs)   Current Outpatient Medications (Other)  Medication Sig   amiodarone (PACERONE) 200 MG tablet Take 0.5 tablets (100 mg total) by mouth daily.   amLODipine (NORVASC) 5 MG tablet Take 1 tablet (5 mg total) by mouth daily. (Patient taking differently: Take 2.5 mg by mouth 2 (two) times daily. )   Bevacizumab (AVASTIN IV) Place into the right eye every 6 (six) weeks.    carvedilol (COREG) 3.125 MG tablet Take 1 tablet by mouth twice daily   cloNIDine (CATAPRES) 0.2 MG tablet Take 1 tablet (0.2 mg total) by mouth 3 (three) times daily.   diazepam (VALIUM) 2 MG tablet Take 2 mg by mouth at bedtime as needed (for sleep).    diltiazem (CARDIZEM) 30 MG tablet Take 30 mg by mouth 2 (two) times daily as needed (as needed for heart rate greater than 100).   ELIQUIS 2.5 MG TABS  tablet Take 1 tablet by mouth twice daily   EQ Fiber Supplement 2 g CHEW Chew 2 tablets by mouth daily.   furosemide (LASIX) 20 MG tablet Take 1 tablet (20 mg total) by mouth daily.   losartan (COZAAR) 100 MG tablet Take 100 mg by mouth daily. Per PCP    minoxidil (LONITEN) 2.5 MG tablet Take 1 tablet by mouth twice daily   Multiple Vitamins-Minerals (PRESERVISION AREDS 2) CAPS Take 1 capsule by mouth daily.   No current facility-administered medications for this visit. (Other)      REVIEW OF SYSTEMS:    ALLERGIES Allergies  Allergen Reactions   Codeine Itching   Penicillins Itching, Rash and Other (See Comments)    Has patient had a PCN reaction causing immediate rash, facial/tongue/throat swelling, SOB or lightheadedness with hypotension: Yes Has patient had a PCN reaction causing severe rash involving mucus membranes or skin necrosis: No Has patient had a PCN reaction that required hospitalization: No Has patient had a PCN reaction occurring within the last 10 years: No If all of the above answers are "NO", then may proceed with Cephalosporin use.   Doxazosin     Heavy feeling in chest, congestion, wheezing   Doxycycline Other (See Comments)    Cause chest tightness  Hydralazine     Felt bad and could hear pulse in head     PAST MEDICAL HISTORY Past Medical History:  Diagnosis Date   Atrial fibrillation ([place]) 2017   a. s/p DCCV in 10/2015  b. recurrent in 08/2016 --> rate-control pursued.    Cancer ([place])    Breast   CKD (chronic kidney disease)    GCA (giant cell arteritis) (HCC)    Hypertension    Macular degeneration    Osteoporosis    PMR (polymyalgia rheumatica) (HCC)    Resistant hypertension 03/15/2019   Shortness of breath 03/15/2019   Skin cancer    Past Surgical History:  Procedure Laterality Date   CARDIOVERSION N/A 10/18/2015   Procedure: CARDIOVERSION;  Surgeon: Jerline Pain, MD;  Location: [place] [facility];  Service:  Cardiovascular;  Laterality: N/A;   CARDIOVERSION N/A 02/20/2017   Procedure: CARDIOVERSION;  Surgeon: Pixie Casino, MD;  Location: [facility] ENDOSCOPY;  Service: Cardiovascular;  Laterality: N/A;   CARDIOVERSION N/A 06/18/2017   Procedure: CARDIOVERSION;  Surgeon: Sanda Klein, MD;  Location: [facility] ENDOSCOPY;  Service: Cardiovascular;  Laterality: N/A;   CARDIOVERSION N/A 12/21/2018   Procedure: CARDIOVERSION;  Surgeon: Thayer Headings, MD;  Location[facility] ENDOSCOPY;  Service: Cardiovascular;  Laterality: N/A;   KNEE SURGERY  2013   MASTECTOMY  1998   left side   PACEMAKER IMPLANT N/A 07/22/2019   Procedure: PACEMAKER IMPLANT;  Surgeon: Evans Lance, MD;  Location: [place] CV LAB;  Service: Cardiovascular;  Laterality: N/A;    FAMILY HISTORY Family History  Problem Relation Age of Onset   Stroke Mother    Kidney failure Father        died at 64   Heart disease Sister        died at 44   Heart disease Sister        died at 69   Breast cancer Daughter     SOCIAL HISTORY Social History   Tobacco Use   Smoking status: Former Smoker    Quit date: 08/31/1966    Years since quitting: 53.0   Smokeless tobacco: Never Used  Vaping Use   Vaping Use: Never used  Substance Use Topics   Alcohol use: No   Drug use: No         OPHTHALMIC EXAM:  Base Eye Exam    Visual Acuity (ETDRS)      Right Left   Dist cc 20/40 CF @ 3'   Dist ph cc NI NI   Correction: Glasses       Tonometry (Tonopen, 2:36 PM)      Right Left   Pressure 11 12       Pupils      Pupils Dark Light Shape React APD   Right PERRL 5 4 Round Brisk None   Left PERRL 5 4 Round Brisk None       Visual Fields (Counting fingers)      Left Right    Full Full       Extraocular Movement      Right Left    Full Full       Neuro/Psych    Oriented x3: Yes   Mood/Affect: Normal       Dilation    Right eye: 1.0% Mydriacyl, 2.5% Phenylephrine @ 2:36 PM        Slit Lamp and Fundus  Exam    External Exam      Right Left   External Normal Normal  Slit Lamp Exam      Right Left   Lids/Lashes Normal  internal Hordeolum - lower lid, Ectropion, 1+ Collarettes   Conjunctiva/Sclera White and quiet White and quiet   Cornea Clear Clear   Anterior Chamber Deep and quiet Deep and quiet   Iris Round and reactive Round and reactive   Lens Posterior chamber intraocular lens, Open posterior capsule Posterior chamber intraocular lens, Open posterior capsule   Anterior Vitreous Normal Normal       Fundus Exam      Right Left   Posterior Vitreous Normal    Disc Normal    C/D Ratio 0.45    Macula Geographic atrophy, Advanced age related macular degeneration, Disciform scar, Drusen, Retinal pigment epithelial mottling    Vessels Normal    Periphery Normal           IMAGING AND PROCEDURES  Imaging and Procedures for 08/23/19  OCT, Retina - OU - Both Eyes       Right Eye Quality was good. Scan locations included subfoveal. Central Foveal Thickness: 286. Progression has improved. Findings include no SRF, intraretinal fluid.   Left Eye Quality was good. Scan locations included subfoveal. Central Foveal Thickness: 287. Progression has been stable. Findings include no SRF, retinal drusen , outer retinal atrophy, central retinal atrophy, subretinal hyper-reflective material, subretinal scarring.   Notes Pigment epithelial detachment with intraretinal fluid nasal to the fovea OD is stable  OS with advanced dry atrophic ARMD stable       Intravitreal Injection, Pharmacologic Agent - OD - Right Eye       Time Out 08/23/2019. 3:25 PM. Confirmed correct patient, procedure, site, and patient consented.   Anesthesia Topical anesthesia was used. Anesthetic medications included Akten 3.5%.   Procedure Preparation included Tobramycin 0.3%, 10% betadine to eyelids, 5% betadine to ocular surface. A 30 gauge needle was used.   Injection:  2 mg aflibercept Alfonse Flavors)  SOLN   NDC: [id number], Lot: [id number]   Route: Intravitreal, Site: Right Eye, Waste: 0 mg  Post-op Post injection exam found visual acuity of at least counting fingers. The patient tolerated the procedure well. There were no complications. The patient received written and verbal post procedure care education. Post injection medications were not given.                 ASSESSMENT/PLAN:  Advanced nonexudative age-related macular degeneration of left eye with subfoveal involvement Stable, no progression  Exudative age-related macular degeneration of right eye with active choroidal neovascularization (HCC) On Eylea, condition is stabilized yet chronic activity nasal to the fovea overlying pigment epithelial detachment, vascularized      ICD-10-CM   1. Exudative age-related macular degeneration of right eye with active choroidal neovascularization (HCC)  H35.3211 OCT, Retina - OU - Both Eyes    Intravitreal Injection, Pharmacologic Agent - OD - Right Eye    aflibercept (EYLEA) SOLN 2 mg  2. Advanced nonexudative age-related macular degeneration of left eye with subfoveal involvement  H35.3124     1.  2.  3.  Ophthalmic Meds Ordered this visit:  Meds ordered this encounter  Medications   aflibercept (EYLEA) SOLN 2 mg       Return in about 9 weeks (around 10/25/2019) for dilate, OD, EYLEA OCT.  Patient Instructions  Patient to notify the office promptly if new visual acuity decline or distortion develops    Explained the diagnoses, plan, and follow up with the patient and they expressed understanding.  Patient expressed understanding  of the importance of proper follow up care.   Clent Demark Nilaya Bouie M.D. Diseases & Surgery of the Retina and Vitreous [facility] [place] 08/23/19     Abbreviations: M myopia (nearsighted); A astigmatism; H hyperopia (farsighted); P presbyopia; Mrx spectacle prescription;  CTL contact lenses; OD right eye; OS left eye; OU  both eyes  XT exotropia; ET esotropia; PEK punctate epithelial keratitis; PEE punctate epithelial erosions; DES dry eye syndrome; MGD meibomian gland dysfunction; ATs artificial tears; PFAT's preservative free artificial tears; [place] nuclear sclerotic cataract; PSC posterior subcapsular cataract; ERM epi-retinal membrane; PVD posterior vitreous detachment; RD retinal detachment; DM diabetes mellitus; DR diabetic retinopathy; NPDR non-proliferative diabetic retinopathy; PDR proliferative diabetic retinopathy; CSME clinically significant macular edema; DME diabetic macular edema; dbh dot blot hemorrhages; CWS cotton wool spot; POAG primary open angle glaucoma; C/D cup-to-disc ratio; HVF humphrey visual field; GVF goldmann visual field; OCT optical coherence tomography; IOP intraocular pressure; BRVO Branch retinal vein occlusion; CRVO central retinal vein occlusion; CRAO central retinal artery occlusion; BRAO branch retinal artery occlusion; RT retinal tear; SB scleral buckle; PPV pars plana vitrectomy; VH Vitreous hemorrhage; PRP panretinal laser photocoagulation; IVK intravitreal kenalog; VMT vitreomacular traction; MH Macular hole;  NVD neovascularization of the disc; NVE neovascularization elsewhere; AREDS age related eye disease study; ARMD age related macular degeneration; POAG primary open angle glaucoma; EBMD epithelial/anterior basement membrane dystrophy; ACIOL anterior chamber intraocular lens; IOL intraocular lens; PCIOL posterior chamber intraocular lens; Phaco/IOL phacoemulsification with intraocular lens placement; [place] photorefractive keratectomy; LASIK laser assisted in situ keratomileusis; HTN hypertension; DM diabetes mellitus; COPD chronic obstructive pulmonary disease

## 2019-08-23 NOTE — Patient Instructions (Signed)
Patient to notify the office promptly if new visual acuity decline or distortion develops

## 2019-08-23 NOTE — Assessment & Plan Note (Signed)
Stable, no progression

## 2019-09-23 DIAGNOSIS — H02004 Unspecified entropion of left upper eyelid: Secondary | ICD-10-CM | POA: Diagnosis not present

## 2019-09-23 DIAGNOSIS — H02135 Senile ectropion of left lower eyelid: Secondary | ICD-10-CM | POA: Diagnosis not present

## 2019-09-23 DIAGNOSIS — H04522 Eversion of left lacrimal punctum: Secondary | ICD-10-CM | POA: Diagnosis not present

## 2019-09-23 DIAGNOSIS — H16212 Exposure keratoconjunctivitis, left eye: Secondary | ICD-10-CM | POA: Diagnosis not present

## 2019-09-23 DIAGNOSIS — H04222 Epiphora due to insufficient drainage, left lacrimal gland: Secondary | ICD-10-CM | POA: Diagnosis not present

## 2019-09-27 ENCOUNTER — Telehealth: Payer: Self-pay

## 2019-09-27 NOTE — Telephone Encounter (Signed)
   [facility] Pre-operative Risk Assessment    HEARTCARE STAFF: - Please ensure there is not already an duplicate clearance open for this procedure. - Under Visit Info/Reason for Call, type in Other and utilize the format Clearance MM/DD/YY or Clearance TBD. Do not use dashes or single digits. - If request is for dental extraction, please clarify the # of teeth to be extracted.  Request for surgical clearance:  1. What type of surgery is being performed? Left Lower Eyelid Ectropion Repair, Left Lower Conjunctivoplasty, Left Upper Eyelid Adjacent Tissue Transfer  2. When is this surgery scheduled? 10/19/2019   3. What type of clearance is required (medical clearance vs. Pharmacy clearance to hold med vs. Both)? Both  Are there any medications that need to be held prior to surgery and how long? Eliquis, How many days prior to procedure to hold?  4. Practice name and name of physician performing surgery? [facility], Sarajane Marek, MD   5. What is the office phone number? [phone]   7.   What is the office fax number?  [phone]  8.   Anesthesia type (None, local, MAC, general) ? MAC   Jacqulynn Cadet 09/27/2019, 10:00 AM  _________________________________________________________________   (provider comments below)

## 2019-09-27 NOTE — Telephone Encounter (Signed)
Patient with diagnosis of A Fib on Eliquis for anticoagulation.    Procedure: Left Lower Eyelid Ectropion Repair, Left Lower Conjunctivoplasty, Left Upper Eyelid Adjacent Tissue Transfer  Date of procedure: 10/19/19  CHADS2-VASc score of  4 (HTN, AGE x 2, female)  CrCl 17 mL/min Platelet count 239K  Per office protocol, patient can hold Eliquis for 2-3 days prior to procedure.    Patient will not need bridging with Lovenox (enoxaparin) around procedure.  If not bridging, patient should restart Eliquis on the evening of procedure or day after, at discretion of procedure MD  For orthopedic procedures please be sure to resume therapeutic (not prophylactic) dosing.

## 2019-09-27 NOTE — Telephone Encounter (Signed)
Pharmacy please address anticoagulation and then I will contact the patient for clearance.  [name] PA-C 09/27/2019 1:44 PM

## 2019-09-27 NOTE — Telephone Encounter (Signed)
   Primary Cardiologist: Pixie Casino, MD  Chart reviewed and patient contacted today by phone as part of pre-operative protocol coverage. Given past medical history and time since last visit, based on ACC/AHA guidelines, NESHIA MCKENZIE would be at acceptable risk for the planned procedure without further cardiovascular testing.   OK to hold Eliquis two days pre op and resume as soon as safe post op.  The patient was advised that if she develops new symptoms prior to surgery to contact our office to arrange for a follow-up visit, and she verbalized understanding.  I will route this recommendation to the requesting party via Epic fax function and remove from pre-op pool.  Please call with questions.  [name], PA-C 09/27/2019, 2:55 PM

## 2019-09-28 DIAGNOSIS — L570 Actinic keratosis: Secondary | ICD-10-CM | POA: Diagnosis not present

## 2019-09-28 DIAGNOSIS — Z85828 Personal history of other malignant neoplasm of skin: Secondary | ICD-10-CM | POA: Diagnosis not present

## 2019-09-28 DIAGNOSIS — C44519 Basal cell carcinoma of skin of other part of trunk: Secondary | ICD-10-CM | POA: Diagnosis not present

## 2019-10-16 DIAGNOSIS — D649 Anemia, unspecified: Secondary | ICD-10-CM | POA: Diagnosis not present

## 2019-10-16 DIAGNOSIS — M179 Osteoarthritis of knee, unspecified: Secondary | ICD-10-CM | POA: Diagnosis not present

## 2019-10-16 DIAGNOSIS — G4700 Insomnia, unspecified: Secondary | ICD-10-CM | POA: Diagnosis not present

## 2019-10-16 DIAGNOSIS — I10 Essential (primary) hypertension: Secondary | ICD-10-CM | POA: Diagnosis not present

## 2019-10-16 DIAGNOSIS — E781 Pure hyperglyceridemia: Secondary | ICD-10-CM | POA: Diagnosis not present

## 2019-10-16 DIAGNOSIS — I4891 Unspecified atrial fibrillation: Secondary | ICD-10-CM | POA: Diagnosis not present

## 2019-10-16 DIAGNOSIS — N184 Chronic kidney disease, stage 4 (severe): Secondary | ICD-10-CM | POA: Diagnosis not present

## 2019-10-16 DIAGNOSIS — N183 Chronic kidney disease, stage 3 unspecified: Secondary | ICD-10-CM | POA: Diagnosis not present

## 2019-10-16 DIAGNOSIS — Z853 Personal history of malignant neoplasm of breast: Secondary | ICD-10-CM | POA: Diagnosis not present

## 2019-10-19 DIAGNOSIS — H16212 Exposure keratoconjunctivitis, left eye: Secondary | ICD-10-CM | POA: Diagnosis not present

## 2019-10-19 DIAGNOSIS — H02004 Unspecified entropion of left upper eyelid: Secondary | ICD-10-CM | POA: Diagnosis not present

## 2019-10-19 DIAGNOSIS — H02034 Senile entropion of left upper eyelid: Secondary | ICD-10-CM | POA: Diagnosis not present

## 2019-10-19 DIAGNOSIS — H04222 Epiphora due to insufficient drainage, left lacrimal gland: Secondary | ICD-10-CM | POA: Diagnosis not present

## 2019-10-19 DIAGNOSIS — H04522 Eversion of left lacrimal punctum: Secondary | ICD-10-CM | POA: Diagnosis not present

## 2019-10-19 DIAGNOSIS — H02135 Senile ectropion of left lower eyelid: Secondary | ICD-10-CM | POA: Diagnosis not present

## 2019-10-21 ENCOUNTER — Ambulatory Visit (INDEPENDENT_AMBULATORY_CARE_PROVIDER_SITE_OTHER): Payer: Medicare Other

## 2019-10-21 DIAGNOSIS — I495 Sick sinus syndrome: Secondary | ICD-10-CM | POA: Diagnosis not present

## 2019-10-22 LAB — CUP PACEART REMOTE DEVICE CHECK
Battery Remaining Longevity: 90 mo
Battery Remaining Percentage: 100 %
Brady Statistic RA Percent Paced: 48 %
Brady Statistic RV Percent Paced: 0 %
Date Time Interrogation Session: 20211015022100
Implantable Lead Implant Date: 20210716
Implantable Lead Implant Date: 20210716
Implantable Lead Location: 753859
Implantable Lead Location: 753860
Implantable Lead Model: 7840
Implantable Lead Model: 7841
Implantable Lead Serial Number: 1017229
Implantable Lead Serial Number: 1090224
Implantable Pulse Generator Implant Date: 20210716
Lead Channel Impedance Value: 625 Ohm
Lead Channel Impedance Value: 714 Ohm
Lead Channel Pacing Threshold Amplitude: 0.5 V
Lead Channel Pacing Threshold Amplitude: 0.9 V
Lead Channel Pacing Threshold Pulse Width: 0.4 ms
Lead Channel Pacing Threshold Pulse Width: 0.4 ms
Lead Channel Setting Pacing Amplitude: 3.5 V
Lead Channel Setting Pacing Amplitude: 3.5 V
Lead Channel Setting Pacing Pulse Width: 0.4 ms
Lead Channel Setting Sensing Sensitivity: 2.5 mV
Pulse Gen Serial Number: 547553

## 2019-10-25 ENCOUNTER — Other Ambulatory Visit: Payer: Self-pay

## 2019-10-25 ENCOUNTER — Encounter (INDEPENDENT_AMBULATORY_CARE_PROVIDER_SITE_OTHER): Payer: Self-pay | Admitting: Ophthalmology

## 2019-10-25 ENCOUNTER — Ambulatory Visit (INDEPENDENT_AMBULATORY_CARE_PROVIDER_SITE_OTHER): Payer: Medicare Other | Admitting: Ophthalmology

## 2019-10-25 DIAGNOSIS — H35351 Cystoid macular degeneration, right eye: Secondary | ICD-10-CM

## 2019-10-25 DIAGNOSIS — H353211 Exudative age-related macular degeneration, right eye, with active choroidal neovascularization: Secondary | ICD-10-CM | POA: Diagnosis not present

## 2019-10-25 DIAGNOSIS — H353124 Nonexudative age-related macular degeneration, left eye, advanced atrophic with subfoveal involvement: Secondary | ICD-10-CM

## 2019-10-25 MED ORDER — AFLIBERCEPT 2MG/0.05ML IZ SOLN FOR KALEIDOSCOPE
2.0000 mg | INTRAVITREAL | Status: AC | PRN
  Administered 2019-10-25: 2 mg via INTRAVITREAL

## 2019-10-25 NOTE — Assessment & Plan Note (Signed)
Accounts for acuity OS 

## 2019-10-25 NOTE — Progress Notes (Signed)
10/25/2019     CHIEF COMPLAINT Patient presents for Retina Follow Up   HISTORY OF PRESENT ILLNESS: Allison Norman is a 86 y.o. female who presents to the clinic today for:   HPI    Retina Follow Up    Patient presents with  Wet AMD.  In right eye.  Severity is moderate.  Duration of 9 weeks.  Since onset it is stable.  I, the attending physician,  performed the HPI with the patient and updated documentation appropriately.          Comments    9 Week wet AMD f\u OD. Possible Eylea OD.OCT  Pt states she has to wear gls to watch TV now. Pt had eyelid sx on OS last week.       Last edited by Tilda Franco on 10/25/2019  2:15 PM. (History)      Referring physician: [name], MD [place],  [place] [address]  HISTORICAL INFORMATION:   Selected notes from the MEDICAL RECORD NUMBER       CURRENT MEDICATIONS: Current Outpatient Medications (Ophthalmic Drugs)  Medication Sig  . bacitracin-polymyxin b (POLYSPORIN) ophthalmic ointment Place 1 application into the left eye at bedtime. Place a 1/2 inch ribbon of ointment into the lower eyelid. (Patient not taking: Reported on 07/11/2019)   No current facility-administered medications for this visit. (Ophthalmic Drugs)   Current Outpatient Medications (Other)  Medication Sig  . amiodarone (PACERONE) 200 MG tablet Take 0.5 tablets (100 mg total) by mouth daily.  [name] amLODipine (NORVASC) 5 MG tablet Take 1 tablet (5 mg total) by mouth daily. (Patient taking differently: Take 2.5 mg by mouth 2 (two) times daily. )  . Bevacizumab (AVASTIN IV) Place into the right eye every 6 (six) weeks.   . carvedilol (COREG) 3.125 MG tablet Take 1 tablet by mouth twice daily  . cloNIDine (CATAPRES) 0.2 MG tablet Take 1 tablet (0.2 mg total) by mouth 3 (three) times daily.  . diazepam (VALIUM) 2 MG tablet Take 2 mg by mouth at bedtime as needed (for sleep).   [name] diltiazem (CARDIZEM) 30 MG tablet Take 30 mg by mouth 2 (two)  times daily as needed (as needed for heart rate greater than 100).  [name] ELIQUIS 2.5 MG TABS tablet Take 1 tablet by mouth twice daily  . EQ Fiber Supplement 2 g CHEW Chew 2 tablets by mouth daily.  . furosemide (LASIX) 20 MG tablet Take 1 tablet (20 mg total) by mouth daily.  [name] losartan (COZAAR) 100 MG tablet Take 100 mg by mouth daily. Per PCP   . minoxidil (LONITEN) 2.5 MG tablet Take 1 tablet by mouth twice daily  . Multiple Vitamins-Minerals (PRESERVISION AREDS 2) CAPS Take 1 capsule by mouth daily.   No current facility-administered medications for this visit. (Other)      REVIEW OF SYSTEMS:    ALLERGIES Allergies  Allergen Reactions  . Codeine Itching  . Penicillins Itching, Rash and Other (See Comments)    Has patient had a PCN reaction causing immediate rash, facial/tongue/throat swelling, SOB or lightheadedness with hypotension: Yes Has patient had a PCN reaction causing severe rash involving mucus membranes or skin necrosis: No Has patient had a PCN reaction that required hospitalization: No Has patient had a PCN reaction occurring within the last 10 years: No If all of the above answers are "NO", then may proceed with Cephalosporin use.  . Doxazosin     Heavy feeling in chest, congestion,  wheezing  . Doxycycline Other (See Comments)    Cause chest tightness  . Hydralazine     Felt bad and could hear pulse in head     PAST MEDICAL HISTORY Past Medical History:  Diagnosis Date  . Atrial fibrillation ([place]) 2017   a. s/p DCCV in 10/2015  b. recurrent in 08/2016 --> rate-control pursued.   . Cancer ([place])    Breast  . CKD (chronic kidney disease)   . GCA (giant cell arteritis) ([place])   . Hypertension   . Macular degeneration   . Osteoporosis   . PMR (polymyalgia rheumatica) (HCC)   . Resistant hypertension 03/15/2019  . Shortness of breath 03/15/2019  . Skin cancer    Past Surgical History:  Procedure Laterality Date  . CARDIOVERSION N/A 10/18/2015   Procedure:  CARDIOVERSION;  Surgeon: Jerline Pain, MD;  Location: [place];  Service: Cardiovascular;  Laterality: N/A;  . CARDIOVERSION N/A 02/20/2017   Procedure: CARDIOVERSION;  Surgeon: Pixie Casino, MD;  Location: [facility] ENDOSCOPY;  Service: Cardiovascular;  Laterality: N/A;  . CARDIOVERSION N/A 06/18/2017   Procedure: CARDIOVERSION;  Surgeon: Sanda Klein, MD;  Location: [place] ENDOSCOPY;  Service: Cardiovascular;  Laterality: N/A;  . CARDIOVERSION N/A 12/21/2018   Procedure: CARDIOVERSION;  Surgeon: Thayer Headings, MD;  Location: [facility] ENDOSCOPY;  Service: Cardiovascular;  Laterality: N/A;  . KNEE SURGERY  2013  . MASTECTOMY  1998   left side  . PACEMAKER IMPLANT N/A 07/22/2019   Procedure: PACEMAKER IMPLANT;  Surgeon: Evans Lance, MD;  Location: [place] CV LAB;  Service: Cardiovascular;  Laterality: N/A;    FAMILY HISTORY Family History  Problem Relation Age of Onset  . Stroke Mother   . Kidney failure Father        died at 20  . Heart disease Sister        died at 47  . Heart disease Sister        died at 21  . Breast cancer Daughter     SOCIAL HISTORY Social History   Tobacco Use  . Smoking status: Former Smoker    Quit date: 08/31/1966    Years since quitting: 53.1  . Smokeless tobacco: Never Used  Vaping Use  . Vaping Use: Never used  Substance Use Topics  . Alcohol use: No  . Drug use: No         OPHTHALMIC EXAM: Base Eye Exam    Visual Acuity (Snellen - Linear)      Right Left   Dist cc 20/50 -2 CF @ 3'   Dist ph cc 20/50 + NI       Tonometry (Tonopen, 2:18 PM)      Right Left   Pressure 13 refu  Refused IOP check for OS       Pupils      Pupils Dark Light Shape React APD   Right PERRL 4 3 Round Sluggish None   Left PERRL 4 3 Round Sluggish None       Visual Fields (Counting fingers)      Left Right     Full   Restrictions Partial outer superior nasal deficiency        Neuro/Psych    Oriented x3: Yes   Mood/Affect: Normal        Dilation    Right eye: 1.0% Mydriacyl, 2.5% Phenylephrine @ 2:19 PM        Slit Lamp and Fundus Exam    External Exam  Right Left   External Normal Normal       Slit Lamp Exam      Right Left   Lids/Lashes Normal  internal Hordeolum - lower lid, Ectropion, 1+ Collarettes   Conjunctiva/Sclera White and quiet White and quiet   Cornea Clear Clear   Anterior Chamber Deep and quiet Deep and quiet   Iris Round and reactive Round and reactive   Lens Posterior chamber intraocular lens, Open posterior capsule Posterior chamber intraocular lens, Open posterior capsule   Anterior Vitreous Normal Normal       Fundus Exam      Right Left   Posterior Vitreous Normal    Disc Normal    C/D Ratio 0.45    Macula Geographic atrophy, Advanced age related macular degeneration, Disciform scar nasal to faz, , Drusen, Retinal pigment epithelial mottling    Vessels Normal    Periphery Normal           IMAGING AND PROCEDURES  Imaging and Procedures for 10/25/19  OCT, Retina - OU - Both Eyes       Right Eye Quality was good. Scan locations included subfoveal. Central Foveal Thickness: 298. Progression has improved. Findings include no SRF, intraretinal fluid, vitreomacular adhesion .   Left Eye Quality was borderline. Scan locations included subfoveal. Central Foveal Thickness: 321. Progression has been stable. Findings include no SRF, retinal drusen , outer retinal atrophy, central retinal atrophy, subretinal hyper-reflective material, subretinal scarring.   Notes Pigment epithelial detachment with intraretinal fluid nasal to the fovea OD is stable  OS with advanced dry atrophic ARMD stable, no change in thickness OS, the computer selected lower baseline       Intravitreal Injection, Pharmacologic Agent - OD - Right Eye       Time Out 10/25/2019. 2:55 PM. Confirmed correct patient, procedure, site, and patient consented.   Anesthesia Topical anesthesia was used. Anesthetic  medications included Akten 3.5%.   Procedure Preparation included Tobramycin 0.3%, 10% betadine to eyelids, 5% betadine to ocular surface, Ofloxacin . A 30 gauge needle was used.   Injection:  2 mg aflibercept Alfonse Flavors) SOLN   NDC: [id number], Lot: [id number]   Route: Intravitreal, Site: Right Eye, Waste: 0 mg  Post-op Post injection exam found visual acuity of at least counting fingers. The patient tolerated the procedure well. There were no complications. The patient received written and verbal post procedure care education. Post injection medications were not given.                 ASSESSMENT/PLAN:  Advanced nonexudative age-related macular degeneration of left eye with subfoveal involvement Accounts for acuity OS  Cystoid macular edema of right eye Intraretinal fluid secondary to PED vascularized with CNVM  Exudative age-related macular degeneration of right eye with active choroidal neovascularization ([place]) OD currently at 9 weeek follow-up interval, stable macular findings on examination as well as on OCT.      ICD-10-CM   1. Exudative age-related macular degeneration of right eye with active choroidal neovascularization (HCC)  H35.3211 OCT, Retina - OU - Both Eyes    Intravitreal Injection, Pharmacologic Agent - OD - Right Eye    aflibercept (EYLEA) SOLN 2 mg  2. Advanced nonexudative age-related macular degeneration of left eye with subfoveal involvement  H35.3124   3. Cystoid macular edema of right eye  H35.351     1.  2.  3.  Ophthalmic Meds Ordered this visit:  Meds ordered this encounter  Medications  . aflibercept (EYLEA) SOLN  2 mg       Return in about 10 weeks (around 01/03/2020) for dilate, OD, EYLEA OCT.  There are no Patient Instructions on file for this visit.   Explained the diagnoses, plan, and follow up with the patient and they expressed understanding.  Patient expressed understanding of the importance of proper follow up care.    Clent Demark Norrin Shreffler M.D. Diseases & Surgery of the Retina and Vitreous Retina & Diabetic [place] 10/25/19     Abbreviations: M myopia (nearsighted); A astigmatism; H hyperopia (farsighted); P presbyopia; Mrx spectacle prescription;  CTL contact lenses; OD right eye; OS left eye; OU both eyes  XT exotropia; ET esotropia; PEK punctate epithelial keratitis; PEE punctate epithelial erosions; DES dry eye syndrome; MGD meibomian gland dysfunction; ATs artificial tears; PFAT's preservative free artificial tears; [place] nuclear sclerotic cataract; PSC posterior subcapsular cataract; ERM epi-retinal membrane; PVD posterior vitreous detachment; RD retinal detachment; DM diabetes mellitus; DR diabetic retinopathy; NPDR non-proliferative diabetic retinopathy; PDR proliferative diabetic retinopathy; CSME clinically significant macular edema; DME diabetic macular edema; dbh dot blot hemorrhages; CWS cotton wool spot; POAG primary open angle glaucoma; C/D cup-to-disc ratio; HVF humphrey visual field; GVF goldmann visual field; OCT optical coherence tomography; IOP intraocular pressure; BRVO Branch retinal vein occlusion; CRVO central retinal vein occlusion; CRAO central retinal artery occlusion; BRAO branch retinal artery occlusion; RT retinal tear; SB scleral buckle; PPV pars plana vitrectomy; VH Vitreous hemorrhage; PRP panretinal laser photocoagulation; IVK intravitreal kenalog; VMT vitreomacular traction; MH Macular hole;  NVD neovascularization of the disc; NVE neovascularization elsewhere; AREDS age related eye disease study; ARMD age related macular degeneration; POAG primary open angle glaucoma; EBMD epithelial/anterior basement membrane dystrophy; ACIOL anterior chamber intraocular lens; IOL intraocular lens; PCIOL posterior chamber intraocular lens; Phaco/IOL phacoemulsification with intraocular lens placement; [place] photorefractive keratectomy; LASIK laser assisted in situ keratomileusis; HTN hypertension; DM  diabetes mellitus; COPD chronic obstructive pulmonary disease

## 2019-10-25 NOTE — Assessment & Plan Note (Signed)
OD currently at 9 weeek follow-up interval, stable macular findings on examination as well as on OCT.

## 2019-10-25 NOTE — Assessment & Plan Note (Signed)
Intraretinal fluid secondary to PED vascularized with CNVM

## 2019-10-26 NOTE — Progress Notes (Signed)
Remote pacemaker transmission.   

## 2019-10-30 DIAGNOSIS — Z23 Encounter for immunization: Secondary | ICD-10-CM | POA: Diagnosis not present

## 2019-11-01 ENCOUNTER — Other Ambulatory Visit: Payer: Self-pay | Admitting: Family Medicine

## 2019-11-01 ENCOUNTER — Other Ambulatory Visit: Payer: Self-pay

## 2019-11-01 ENCOUNTER — Encounter: Payer: Self-pay | Admitting: Internal Medicine

## 2019-11-01 ENCOUNTER — Ambulatory Visit (INDEPENDENT_AMBULATORY_CARE_PROVIDER_SITE_OTHER): Payer: Medicare Other | Admitting: Internal Medicine

## 2019-11-01 VITALS — BP 160/72 | HR 61 | Ht 65.5 in | Wt 140.6 lb

## 2019-11-01 DIAGNOSIS — Z1231 Encounter for screening mammogram for malignant neoplasm of breast: Secondary | ICD-10-CM

## 2019-11-01 DIAGNOSIS — Z950 Presence of cardiac pacemaker: Secondary | ICD-10-CM | POA: Insufficient documentation

## 2019-11-01 DIAGNOSIS — I495 Sick sinus syndrome: Secondary | ICD-10-CM

## 2019-11-01 NOTE — Progress Notes (Signed)
HPI [name] returns today for followup. She is a pleasant 86 yo woman with a h/o symptomatic tachy-brady syndrome, PAF who has been maintained on amiodarone, s/p PPM insertion 4 months ago. In the interim, she notes she feels well. She has been sedentary. No palpitations or sob.  Allergies  Allergen Reactions  . Codeine Itching  . Penicillins Itching, Rash and Other (See Comments)    Has patient had a PCN reaction causing immediate rash, facial/tongue/throat swelling, SOB or lightheadedness with hypotension: Yes Has patient had a PCN reaction causing severe rash involving mucus membranes or skin necrosis: No Has patient had a PCN reaction that required hospitalization: No Has patient had a PCN reaction occurring within the last 10 years: No If all of the above answers are "NO", then may proceed with Cephalosporin use.  . Doxazosin     Heavy feeling in chest, congestion, wheezing  . Doxycycline Other (See Comments)    Cause chest tightness  . Hydralazine     Felt bad and could hear pulse in head      Current Outpatient Medications  Medication Sig Dispense Refill  . amiodarone (PACERONE) 200 MG tablet Take 0.5 tablets (100 mg total) by mouth daily. 90 tablet 2  . amLODipine (NORVASC) 5 MG tablet Take 1 tablet (5 mg total) by mouth daily. 90 tablet 3  . bacitracin-polymyxin b (POLYSPORIN) ophthalmic ointment Place 1 application into the left eye at bedtime. Place a 1/2 inch ribbon of ointment into the lower eyelid. 10.5 g 11  . Bevacizumab (AVASTIN IV) Place into the right eye every 6 (six) weeks.     . carvedilol (COREG) 3.125 MG tablet Take 1 tablet by mouth twice daily 60 tablet 11  . cloNIDine (CATAPRES) 0.2 MG tablet Take 1 tablet (0.2 mg total) by mouth 3 (three) times daily. 270 tablet 3  . diazepam (VALIUM) 2 MG tablet Take 2 mg by mouth at bedtime as needed (for sleep).   0  . diltiazem (CARDIZEM) 30 MG tablet Take 30 mg by mouth 2 (two) times daily as needed (as  needed for heart rate greater than 100).    [name] ELIQUIS 2.5 MG TABS tablet Take 1 tablet by mouth twice daily 180 tablet 1  . EQ Fiber Supplement 2 g CHEW Chew 2 tablets by mouth daily.    . furosemide (LASIX) 20 MG tablet Take 1 tablet (20 mg total) by mouth daily. 90 tablet 3  . losartan (COZAAR) 100 MG tablet Take 100 mg by mouth daily. Per PCP     . minoxidil (LONITEN) 2.5 MG tablet Take 1 tablet by mouth twice daily 60 tablet 5  . Multiple Vitamins-Minerals (PRESERVISION AREDS 2) CAPS Take 1 capsule by mouth daily.     No current facility-administered medications for this visit.     Past Medical History:  Diagnosis Date  . Atrial fibrillation ([place]) 2017   a. s/p DCCV in 10/2015  b. recurrent in 08/2016 --> rate-control pursued.   . Cancer ([place])    Breast  . CKD (chronic kidney disease)   . GCA (giant cell arteritis) ([place])   . Hypertension   . Macular degeneration   . Osteoporosis   . PMR (polymyalgia rheumatica) (HCC)   . Resistant hypertension 03/15/2019  . Shortness of breath 03/15/2019  . Skin cancer     ROS:   All systems reviewed and negative except as noted in the HPI.   Past Surgical History:  Procedure Laterality Date  .  CARDIOVERSION N/A 10/18/2015   Procedure: CARDIOVERSION;  Surgeon: Jerline Pain, MD;  Location: [place];  Service: Cardiovascular;  Laterality: N/A;  . CARDIOVERSION N/A 02/20/2017   Procedure: CARDIOVERSION;  Surgeon: Pixie Casino, MD;  Location: [facility] ENDOSCOPY;  Service: Cardiovascular;  Laterality: N/A;  . CARDIOVERSION N/A 06/18/2017   Procedure: CARDIOVERSION;  Surgeon: Sanda Klein, MD;  Location: [place] ENDOSCOPY;  Service: Cardiovascular;  Laterality: N/A;  . CARDIOVERSION N/A 12/21/2018   Procedure: CARDIOVERSION;  Surgeon: Thayer Headings, MD;  Location: [facility] ENDOSCOPY;  Service: Cardiovascular;  Laterality: N/A;  . KNEE SURGERY  2013  . MASTECTOMY  1998   left side  . PACEMAKER IMPLANT N/A 07/22/2019   Procedure: PACEMAKER IMPLANT;   Surgeon: Evans Lance, MD;  Location: [place] CV LAB;  Service: Cardiovascular;  Laterality: N/A;     Family History  Problem Relation Age of Onset  . Stroke Mother   . Kidney failure Father        died at 21  . Heart disease Sister        died at 63  . Heart disease Sister        died at 59  . Breast cancer Daughter      Social History   Socioeconomic History  . Marital status: Married    Spouse name: Not on file  . Number of children: 2  . Years of education: Not on file  . Highest education level: Not on file  Occupational History  . Not on file  Tobacco Use  . Smoking status: Former Smoker    Quit date: 08/31/1966    Years since quitting: 53.2  . Smokeless tobacco: Never Used  Vaping Use  . Vaping Use: Never used  Substance and Sexual Activity  . Alcohol use: No  . Drug use: No  . Sexual activity: Not on file  Other Topics Concern  . Not on file  Social History Narrative   epworth sleepiness scale = 8 (08/31/15)   Social Determinants of Health   Financial Resource Strain:   . Difficulty of Paying Living Expenses: Not on file  Food Insecurity:   . Worried About Charity fundraiser in the Last Year: Not on file  . Ran Out of Food in the Last Year: Not on file  Transportation Needs:   . Lack of Transportation (Medical): Not on file  . Lack of Transportation (Non-Medical): Not on file  Physical Activity:   . Days of Exercise per Week: Not on file  . Minutes of Exercise per Session: Not on file  Stress:   . Feeling of Stress : Not on file  Social Connections:   . Frequency of Communication with Friends and Family: Not on file  . Frequency of Social Gatherings with Friends and Family: Not on file  . Attends Religious Services: Not on file  . Active Member of Clubs or Organizations: Not on file  . Attends Archivist Meetings: Not on file  . Marital Status: Not on file  Intimate Partner Violence:   . Fear of Current or Ex-Partner: Not on  file  . Emotionally Abused: Not on file  . Physically Abused: Not on file  . Sexually Abused: Not on file     BP (!) 160/72   Pulse 61   Ht 5' 5.5" (1.664 m)   Wt 140 lb 9.6 oz (63.8 kg)   SpO2 92%   BMI 23.04 kg/m   Physical Exam:  Well appearing  elderly woman, NAD HEENT: Unremarkable Neck:  6 cm JVD, no thyromegally Lymphatics:  No adenopathy Back:  No CVA tenderness Lungs:  Clear with no wheezes HEART:  Regular rate rhythm, no murmurs, no rubs, no clicks Abd:  soft, positive bowel sounds, no organomegally, no rebound, no guarding Ext:  2 plus pulses, no edema, no cyanosis, no clubbing Skin:  No rashes no nodules Neuro:  CN II through XII intact, motor grossly intact  EKG - nsr  DEVICE  Normal device function.  See PaceArt for details.   Assess/Plan: 1. Sinus node dysfunction - she is asymptomatic, s/p PPM insertion. 2. PAF - she is maintaining NSR on low dose amiodarone. 3. PPM -her [place] Sci DDD PM is working normally. 4. HTN - her sbp is high but is usually much better. She will continue her current meds, maintain a low sodium diet and remain active. I encouraged her to walk.  Carleene Overlie Carlise Stofer,MD

## 2019-11-01 NOTE — Patient Instructions (Signed)

## 2019-11-24 ENCOUNTER — Telehealth: Payer: Self-pay | Admitting: Cardiovascular Disease

## 2019-11-24 MED ORDER — MINOXIDIL 2.5 MG PO TABS
5.0000 mg | ORAL_TABLET | Freq: Two times a day (BID) | ORAL | 2 refills | Status: DC
Start: 2019-11-24 — End: 2020-02-20

## 2019-11-24 NOTE — Telephone Encounter (Signed)
Pt c/o BP issue: STAT if pt c/o blurred vision, one-sided weakness or slurred speech  1. What are your last 5 BP readings?  46/50/35 465/68  Systolic has been ranging 157,163, & 165  2. Are you having any other symptoms (ex. Dizziness, headache, blurred vision, passed out)? No   3. What is your BP issue? Allison Norman is calling requesting to speak with a nurse of Dr. Blenda Mounts in regards to her BP. Please advise.

## 2019-11-24 NOTE — Telephone Encounter (Signed)
Conveyed orders from PharmD to increase minoxidil to 5 mg twice a day. Appt made for follow up visit with Dr. Oval Linsey 11/29/2019 at 11:20. Pt advised to log BP twice a day and bring readings to her appointment. Pt verbalizes understanding.

## 2019-11-24 NOTE — Telephone Encounter (Signed)
Patient taking minoxidil as prescribed (2.5mg  BID)? She can increase dose to 5mg  twice daily

## 2019-11-24 NOTE — Telephone Encounter (Signed)
Pt states that over the last week she has noticed elevated systolic BP readings: 160, 163, 157, 167. The pt reports her diastolic normally normally runs in the 60s-70s.   The pt denies chest pain, shortness of breath, dizziness, headaches or weight gain. States she has chronic, stable ankle edema in the mornings that has not worsened.  The patient denies any illness or inciting incidents that may have influenced her BP over the last week. Denies missed doses of medications. She states she tries to limit her sodium intake. She does question whether she could have her medications updated now that she is 2 months status post pacemaker implantation with Dr. Lovena Le.   Will route to MD and PharmD for review.

## 2019-11-29 ENCOUNTER — Telehealth: Payer: Self-pay

## 2019-11-29 ENCOUNTER — Encounter: Payer: Self-pay | Admitting: Cardiovascular Disease

## 2019-11-29 ENCOUNTER — Ambulatory Visit (INDEPENDENT_AMBULATORY_CARE_PROVIDER_SITE_OTHER): Payer: Medicare Other | Admitting: Cardiovascular Disease

## 2019-11-29 ENCOUNTER — Other Ambulatory Visit: Payer: Self-pay

## 2019-11-29 VITALS — BP 176/70 | HR 64 | Ht 65.5 in | Wt 144.2 lb

## 2019-11-29 DIAGNOSIS — I10 Essential (primary) hypertension: Secondary | ICD-10-CM | POA: Diagnosis not present

## 2019-11-29 DIAGNOSIS — I4891 Unspecified atrial fibrillation: Secondary | ICD-10-CM | POA: Diagnosis not present

## 2019-11-29 DIAGNOSIS — I484 Atypical atrial flutter: Secondary | ICD-10-CM | POA: Diagnosis not present

## 2019-11-29 MED ORDER — CARVEDILOL 12.5 MG PO TABS
12.5000 mg | ORAL_TABLET | Freq: Two times a day (BID) | ORAL | 5 refills | Status: DC
Start: 2019-11-29 — End: 2019-12-06

## 2019-11-29 NOTE — Telephone Encounter (Signed)
   Primary Cardiologist: Pixie Casino, MD  Chart reviewed as part of pre-operative protocol coverage. Simple dental extractions are considered low risk procedures per guidelines and generally do not require any specific cardiac clearance. Patient contacted today for further pre-op evaluation and reported doing well from a cardiac standpoint. No chest pain, shortness of breath, or syncope. OK to proceed with planned procedure without any additional cardiovascular testing.    Per Pharmacy and office protocol: Patient can hold Eliquis for 1-2 days prior to procedure. This should be restarted as soon as able following procedure.   SBE prophylaxis is not required for the patient from a cardiac standpoint.  I will route this recommendation to the requesting party via Epic fax function and remove from pre-op pool.  Please call with questions.  Darreld Mclean, PA-C 11/29/2019, 5:36 PM

## 2019-11-29 NOTE — Telephone Encounter (Addendum)
Patient with diagnosis of afib on Eliquis 2.5mg  BID for anticoagulation.    Procedure: Extraction of tooth #3 w/a excisional biopsy of the upper Right lip   Date of procedure: TBD  CHA2DS2-VASc Score = 4  This indicates a 4.8% annual risk of stroke. The patient's score is based upon: CHF History: 0 HTN History: 1 Diabetes History: 0 Stroke History: 0 Vascular Disease History: 0 Age Score: 2 Gender Score: 1  CrCl 10mL/min  Platelet count 239K  Per office protocol, patient can hold Eliquis for 1-2 days prior to procedure.

## 2019-11-29 NOTE — Progress Notes (Signed)
Hypertension Clinic Follow Up Assessment:    Date:  11/29/2019   ID:  Allison Norman, DOB 09/03/1924, MRN [id number]  PCP:  [name], MD  Cardiologist:  Pixie Casino, MD  Nephrologist:  Referring MD: [name], MD   CC: Hypertension  History of Present Illness:    Allison Norman is a 86 y.o. female with a hx of paroxysmal atrial fibrillation, hypertension, and CKD IV here for follow up. She initially established care in the hypertension clinic 02/2019.  She reports that she was diagnosed with hypertension several years ago.  Prior to developing atrial fibrillation her BP was better controlled.  Her medications were changed for improved rate control.  She has been working with [name], PA-C for her hypertension lately.  Amlodipine was increased to 10mg  but she developed LE edema, so amlodipine was reduced back to 5mg  and lasix was started.  Her edema improved but BP worsened, so she increased back to 10mg .  She noted that she sometimes forgets to take her evening dose of clonidine.  At that time hydralazine was increased to 50 mg 3 times daily.  She was also started on doxazosin.  Metoprolol was switched to carvedilol.  Doxazosin was discontinued due to shortness of breath and heaviness in her chest.  Of note, she had also stopped taking her furosemide.  Once this restarted her breathing improved.  Her blood pressure in the office seems to be consistently elevated.  When she last our pharmacist on 06/2019 her average blood pressure in the morning was 143/75 and 139/75 in the evenings.  She was started on minoxidil and the dose was increased to 10 mg.  Since that time she has stopped both hydralazine and doxazosin due to intolerances.  She does not really remember why they were stopped.  Lately her blood pressure has been running high.  Is mostly in the 140s to 160s.  She does not feel poorly when it is high but she does feel bad when it gets low.  She notes that her  breathing has improved since she had a pacemaker implanted in July.  She is not getting much exercise.  She uses a walker around her home and a cane when she goes out.  She denies any falls.  She did do some physical therapy exercises in the past that she thinks she could start again.  She notes some swelling in her legs at the end of the day which he attributes to amlodipine.  She denies orthopnea or PND.  She continues to limit salt and does not have any caffeine in her diet.  She has a dental procedure upcoming soon.   Previous antihypertensives: Amlodipine- edema at 10 mg Doxazosin Hydralazine   Past Medical History:  Diagnosis Date  . Atrial fibrillation ([place]) 2017   a. s/p DCCV in 10/2015  b. recurrent in 08/2016 --> rate-control pursued.   . Cancer ([place])    Breast  . CKD (chronic kidney disease)   . GCA (giant cell arteritis) ([place])   . Hypertension   . Macular degeneration   . Osteoporosis   . PMR (polymyalgia rheumatica) ([facility])   . Resistant hypertension 03/15/2019  . Shortness of breath 03/15/2019  . Skin cancer     Past Surgical History:  Procedure Laterality Date  . CARDIOVERSION N/A 10/18/2015   Procedure: CARDIOVERSION;  Surgeon: Jerline Pain, MD;  Location: [facility];  Service: Cardiovascular;  Laterality: N/A;  . CARDIOVERSION N/A 02/20/2017  Procedure: CARDIOVERSION;  Surgeon: Pixie Casino, MD;  Location: [facility] ENDOSCOPY;  Service: Cardiovascular;  Laterality: N/A;  . CARDIOVERSION N/A 06/18/2017   Procedure: CARDIOVERSION;  Surgeon: Sanda Klein, MD;  Location: [facility] ENDOSCOPY;  Service: Cardiovascular;  Laterality: N/A;  . CARDIOVERSION N/A 12/21/2018   Procedure: CARDIOVERSION;  Surgeon: Thayer Headings, MD;  Location: [facility];  Service: Cardiovascular;  Laterality: N/A;  . KNEE SURGERY  2013  . MASTECTOMY  1998   left side  . PACEMAKER IMPLANT N/A 07/22/2019   Procedure: PACEMAKER IMPLANT;  Surgeon: Evans Lance, MD;  Location: [facility];   Service: Cardiovascular;  Laterality: N/A;    Current Medications: Current Meds  Medication Sig  . amiodarone (PACERONE) 200 MG tablet Take 0.5 tablets (100 mg total) by mouth daily.  [name] amLODipine (NORVASC) 5 MG tablet Take 1 tablet (5 mg total) by mouth daily.  . Bevacizumab (AVASTIN IV) Place into the right eye every 6 (six) weeks.   . carvedilol (COREG) 12.5 MG tablet Take 1 tablet (12.5 mg total) by mouth 2 (two) times daily.  . cloNIDine (CATAPRES) 0.2 MG tablet Take 1 tablet (0.2 mg total) by mouth 3 (three) times daily.  . diazepam (VALIUM) 2 MG tablet Take 2 mg by mouth at bedtime as needed (for sleep).   [name] diltiazem (CARDIZEM) 30 MG tablet Take 30 mg by mouth 2 (two) times daily as needed (as needed for heart rate greater than 100).  [name] ELIQUIS 2.5 MG TABS tablet Take 1 tablet by mouth twice daily  . EQ Fiber Supplement 2 g CHEW Chew 2 tablets by mouth daily.  . furosemide (LASIX) 20 MG tablet Take 20 mg by mouth as needed.  [name] losartan (COZAAR) 100 MG tablet Take 100 mg by mouth daily. Per PCP   . minoxidil (LONITEN) 2.5 MG tablet Take 2 tablets (5 mg total) by mouth 2 (two) times daily.  . Multiple Vitamins-Minerals (PRESERVISION AREDS 2) CAPS Take 1 capsule by mouth daily.  . [DISCONTINUED] bacitracin-polymyxin b (POLYSPORIN) ophthalmic ointment Place 1 application into the left eye at bedtime. Place a 1/2 inch ribbon of ointment into the lower eyelid.  . [DISCONTINUED] carvedilol (COREG) 3.125 MG tablet Take 1 tablet by mouth twice daily     Allergies:   Codeine, Penicillins, Doxazosin, Doxycycline, and Hydralazine   Social History   Socioeconomic History  . Marital status: Married    Spouse name: Not on file  . Number of children: 2  . Years of education: Not on file  . Highest education level: Not on file  Occupational History  . Not on file  Tobacco Use  . Smoking status: Former Smoker    Quit date: 08/31/1966    Years since quitting: 53.2  . Smokeless tobacco:  Never Used  Vaping Use  . Vaping Use: Never used  Substance and Sexual Activity  . Alcohol use: No  . Drug use: No  . Sexual activity: Not on file  Other Topics Concern  . Not on file  Social History Narrative   epworth sleepiness scale = 8 (08/31/15)   Social Determinants of Health   Financial Resource Strain:   . Difficulty of Paying Living Expenses: Not on file  Food Insecurity:   . Worried About Charity fundraiser in the Last Year: Not on file  . Ran Out of Food in the Last Year: Not on file  Transportation Needs:   . Lack of Transportation (Medical): Not on file  .  Lack of Transportation (Non-Medical): Not on file  Physical Activity:   . Days of Exercise per Week: Not on file  . Minutes of Exercise per Session: Not on file  Stress:   . Feeling of Stress : Not on file  Social Connections:   . Frequency of Communication with Friends and Family: Not on file  . Frequency of Social Gatherings with Friends and Family: Not on file  . Attends Religious Services: Not on file  . Active Member of Clubs or Organizations: Not on file  . Attends Archivist Meetings: Not on file  . Marital Status: Not on file     Family History: The patient's family history includes Breast cancer in her daughter; Heart disease in her sister and sister; Kidney failure in her father; Stroke in her mother.  ROS:   Please see the history of present illness.     All other systems reviewed and are negative.  EKGs/Labs/Other Studies Reviewed:    EKG:  EKG is not ordered today.  Recent Labs: 05/30/2019: ALT 48; TSH 2.264 07/05/2019: BUN 36; Creatinine, Ser 1.99; Hemoglobin 11.7; Platelets 239; Potassium 4.7; Sodium 138   Recent Lipid Panel    Component Value Date/Time   CHOL  07/04/2009 0418    81        ATP III CLASSIFICATION:  <200     mg/dL   Desirable  200-239  mg/dL   Borderline High  >=240    mg/dL   High          TRIG 64 07/04/2009 0418   HDL 18 (L) 07/04/2009 0418    CHOLHDL 4.5 07/04/2009 0418   VLDL 13 07/04/2009 0418   LDLCALC  07/04/2009 0418    50        Total Cholesterol/HDL:CHD Risk Coronary Heart Disease Risk Table                     Men   Women  1/2 Average Risk   3.4   3.3  Average Risk       5.0   4.4  2 X Average Risk   9.6   7.1  3 X Average Risk  23.4   11.0        Use the calculated Patient Ratio above and the CHD Risk Table to determine the patient's CHD Risk.        ATP III CLASSIFICATION (LDL):  <100     mg/dL   Optimal  100-129  mg/dL   Near or Above                    Optimal  130-159  mg/dL   Borderline  160-189  mg/dL   High  >190     mg/dL   Very High    Physical Exam:      Wt Readings from Last 3 Encounters:  11/29/19 144 lb 3.2 oz (65.4 kg)  11/01/19 140 lb 9.6 oz (63.8 kg)  07/22/19 140 lb (63.5 kg)    VS:  BP (!) 176/70   Pulse 64   Ht 5' 5.5" (1.664 m)   Wt 144 lb 3.2 oz (65.4 kg)   BMI 23.63 kg/m  , BMI Body mass index is 23.63 kg/m. GENERAL:  Well appearing HEENT: Pupils equal round and reactive, fundi not visualized, oral mucosa unremarkable NECK:  No jugular venous distention, waveform within normal limits, carotid upstroke brisk and symmetric, no bruits, no thyromegaly LYMPHATICS:  No cervical  adenopathy LUNGS:  Clear to auscultation bilaterally HEART:  RRR.  PMI not displaced or sustained,S1 and S2 within normal limits, no S3, no S4, no clicks, no rubs, no murmurs ABD:  Flat, positive bowel sounds normal in frequency in pitch, no bruits, no rebound, no guarding, no midline pulsatile mass, no hepatomegaly, no splenomegaly EXT:  2 plus pulses throughout, 1+ LE edema, no cyanosis no clubbing SKIN:  No rashes no nodules NEURO:  Cranial nerves II through XII grossly intact, motor grossly intact throughout PSYCH:  Cognitively intact, oriented to person place and time   ASSESSMENT:    1. Essential hypertension   2. Atrial fibrillation with RVR ([place])   3. Atypical atrial flutter ([place])   4.  Resistant hypertension     PLAN:    # Resistant hypertension: Blood pressure remains poorly controlled despite being on several medications.  Her renal function limits options as do her intolerances.  Now that she has a PPM we can increase her carvedilol to 12.5mg  bid. Continue amlodipine, losartan and minoxidil.  We discussed limiting salt and following the DASH diet.  Renal artery Dopplers were normal. She was encouraged to start back walking more with a walker/cane for stability.   Secondary Causes of Hypertension  Medications/Herbal: OCP, steroids, stimulants, antidepressants, weight loss medication, immune suppressants, NSAIDs, sympathomimetics, alcohol, caffeine, licorice, ginseng, St. John's wort, chemo (none identified)  Sleep Apnea: (testing not indicated)  Renal artery stenosis: Renal Doppler normal 03/2019 Hyperaldosteronism: hyperkalemia Hyper/hypothyroidism: TSH normal 05/2019 Pheochromocytoma: testing not indicated Cushing's syndrome: testing not indicated Coarctation of the aorta: BP symmetric  # LE Edema: # Shortness of breath: Volume stable.  She is taking lasix only as needed.    Disposition:    FU with MD/PharmD in 4-6 weeks   Medication Adjustments/Labs and Tests Ordered: Current medicines are reviewed at length with the patient today.  Concerns regarding medicines are outlined above.  No orders of the defined types were placed in this encounter.  Meds ordered this encounter  Medications  . carvedilol (COREG) 12.5 MG tablet    Sig: Take 1 tablet (12.5 mg total) by mouth 2 (two) times daily.    Dispense:  60 tablet    Refill:  5     Signed, Skeet Latch, MD  11/29/2019 12:52 PM    [facility]

## 2019-11-29 NOTE — Telephone Encounter (Signed)
   [facility] Pre-operative Risk Assessment    HEARTCARE STAFF: - Please ensure there is not already an duplicate clearance open for this procedure. - Under Visit Info/Reason for Call, type in Other and utilize the format Clearance MM/DD/YY or Clearance TBD. Do not use dashes or single digits. - If request is for dental extraction, please clarify the # of teeth to be extracted.  Request for surgical clearance:  1. What type of surgery is being performed? Extraction of tooth #3 w/a excisional biopsy of the upper Right lip    2. When is this surgery scheduled? TBD   3. What type of clearance is required (medical clearance vs. Pharmacy clearance to hold med vs. Both)? Medical   4. Are there any medications that need to be held prior to surgery and how long?Eliquis   5. Practice name and name of physician performing surgery? [facility] , Dr. Tamela Oddi  6. What is the office phone number? [phone]   7.   What is the office fax number? [phone]  8.   Anesthesia type (None, local, MAC, general) ? Local   Monia Pouch 11/29/2019, 10:39 AM  _________________________________________________________________   (provider comments below)

## 2019-11-29 NOTE — Telephone Encounter (Signed)
Pharmacy, I know we don't typically recommend holding anticoagulation for 1 tooth extraction but she is also have a lip biopsy? How long does Eliquis need to be held for this?  Thank you!

## 2019-11-29 NOTE — Patient Instructions (Signed)
Medication Instructions:  INCREASE YOUR CARVEDILOL TO 12,5 MG TWICE A DAY   Labwork: NONE  Testing/Procedures: NONE  Follow-Up: 01/11/2020 AT 1:45 PM

## 2019-12-05 ENCOUNTER — Telehealth: Payer: Self-pay | Admitting: Cardiovascular Disease

## 2019-12-05 NOTE — Telephone Encounter (Signed)
Pt c/o medication issue:  1. Name of Medication:  carvedilol (COREG) 12.5 MG tablet  2. How are you currently taking this medication (dosage and times per day)?  As directed   3. Are you having a reaction (difficulty breathing--STAT)?  Yes, has a pulse in her ears at night when she goes to bed. She states she usually gets that kind of reaction when she is allergic to a medication   4. What is your medication issue?  Patient states she may be allergic to this medication and it is not bringing her blood pressure down anyways. She would like to know if there is a different medication, if the dosage can come back down, or what her options are. Please call/advise  Thank you!

## 2019-12-05 NOTE — Telephone Encounter (Signed)
Spoke with patient who states that since increasing her carvedilol dosage to 12.5mg  twice daily she has had pulsating and pounding in her ears at night when she goes to bed. Patient states that her blood pressure today is 175/76 and states that the carvedilol has not helped her blood pressure. Patient denies any symptoms at this time besides the pounding in her ears at bed time. Patient would like to know if there was any other blood pressure medication she can try and if the dose of carvedilol can be reduced. Advised patient I would forward message to Dr. Oval Linsey for review and advice. Patient verbalized understanding.

## 2019-12-05 NOTE — Telephone Encounter (Signed)
OK to go back to 3.125mg  on carvedilol.  Start diltiazem 180mg  daily.  Stop diltiazem prn.

## 2019-12-06 MED ORDER — DILTIAZEM HCL ER COATED BEADS 180 MG PO CP24: 180.0000 mg | ORAL_CAPSULE | Freq: Every day | ORAL | 3 refills | Status: DC

## 2019-12-06 MED ORDER — CARVEDILOL 3.125 MG PO TABS: 3.1250 mg | ORAL_TABLET | Freq: Two times a day (BID) | ORAL | 3 refills | Status: DC

## 2019-12-06 NOTE — Telephone Encounter (Signed)
Per Dr Oval Linsey OK for patient to take Amlodipine and Diltiazem  Advised patient, verbalized understanding

## 2019-12-15 ENCOUNTER — Telehealth: Payer: Self-pay | Admitting: Cardiovascular Disease

## 2019-12-15 ENCOUNTER — Ambulatory Visit: Payer: Medicare Other

## 2019-12-15 NOTE — Telephone Encounter (Signed)
Advised patient, verbalized understanding  

## 2019-12-15 NOTE — Telephone Encounter (Signed)
Often this is due to sinus congestion and drainage.  Start taking Claritin and Flonase

## 2019-12-15 NOTE — Telephone Encounter (Signed)
Spoke with pt who report despite changing medication to diltiazem, pt continues to experience pulsating and pounding in her ears at night. Pt states symptoms doesn't occur during the day but more when she is laying down. Pt is questioning if symptoms could be related to an interaction with her medications.  Will forward to MD and Pharm D for review.

## 2019-12-15 NOTE — Telephone Encounter (Signed)
  Pt c/o medication issue:  1. Name of Medication:   diltiazem (CARDIZEM CD) 180 MG 24 hr capsule     2. How are you currently taking this medication (dosage and times per day)? Take 1 capsule (180 mg total) by mouth daily.  3. Are you having a reaction (difficulty breathing--STAT)?   4. What is your medication issue? Pt is would like to speak with a nurse she said its about this medication

## 2019-12-21 NOTE — Telephone Encounter (Signed)
Patient wanted to speak to [facility] about the Claritin that was recommended last week

## 2019-12-21 NOTE — Telephone Encounter (Signed)
Patient has been taking the Claritin and has helped with the noise she was hearing She read information and it said not to take if she has kidney issues and was told she did Wants to know if ok to continue Will forward to Pharm D for review

## 2019-12-22 NOTE — Telephone Encounter (Signed)
After 1 week on daily claritin, she can decrease dose to 1 tablet every other day for another week, the stop.   Flonase is a nasal spray and no renal dosing is needed.

## 2019-12-23 NOTE — Telephone Encounter (Signed)
Raquel out today, wanted to clarify she knew patient wanted to continue. Discussed with Alena Bills D, comments below  her CrCl is about 18.2 and for anyone with <30 they recommend 10 mg qod. Or she can do the kids chewable 5 mg daily.    Tried to call patient, no answer

## 2019-12-26 DIAGNOSIS — I10 Essential (primary) hypertension: Secondary | ICD-10-CM | POA: Diagnosis not present

## 2019-12-26 DIAGNOSIS — M179 Osteoarthritis of knee, unspecified: Secondary | ICD-10-CM | POA: Diagnosis not present

## 2019-12-27 NOTE — Telephone Encounter (Signed)
Left message to call back  

## 2019-12-28 NOTE — Telephone Encounter (Signed)
Discussed with patient and will call back if blood pressure high before Dr Rocky Morel

## 2019-12-28 NOTE — Telephone Encounter (Signed)
Both diltiazem and amlodipine can cause swelling and in Dr Quintella Reichert notes from 11/23 she noted leg edema while on amlodipine, although it was the 10mg  dose.  Consider d/c diltiazem and increase carvedilol to 6.25mg  BID since patient was intolerant to 12.5mg  dose?

## 2019-12-28 NOTE — Telephone Encounter (Signed)
Pt returning call

## 2019-12-28 NOTE — Telephone Encounter (Signed)
Advised patient and she actually stopped Claritin  She has been having swelling since starting Diltiazem and remembered that is why she stopped before. She has not taken today. SBP running 140's-160's, did not improved with Diltiazem   Will forward to Pharm D for review

## 2020-01-02 DIAGNOSIS — G4700 Insomnia, unspecified: Secondary | ICD-10-CM | POA: Diagnosis not present

## 2020-01-02 DIAGNOSIS — K219 Gastro-esophageal reflux disease without esophagitis: Secondary | ICD-10-CM | POA: Diagnosis not present

## 2020-01-02 DIAGNOSIS — Z853 Personal history of malignant neoplasm of breast: Secondary | ICD-10-CM | POA: Diagnosis not present

## 2020-01-02 DIAGNOSIS — N183 Chronic kidney disease, stage 3 unspecified: Secondary | ICD-10-CM | POA: Diagnosis not present

## 2020-01-02 DIAGNOSIS — I4891 Unspecified atrial fibrillation: Secondary | ICD-10-CM | POA: Diagnosis not present

## 2020-01-02 DIAGNOSIS — E781 Pure hyperglyceridemia: Secondary | ICD-10-CM | POA: Diagnosis not present

## 2020-01-02 DIAGNOSIS — M179 Osteoarthritis of knee, unspecified: Secondary | ICD-10-CM | POA: Diagnosis not present

## 2020-01-02 DIAGNOSIS — N184 Chronic kidney disease, stage 4 (severe): Secondary | ICD-10-CM | POA: Diagnosis not present

## 2020-01-02 DIAGNOSIS — I10 Essential (primary) hypertension: Secondary | ICD-10-CM | POA: Diagnosis not present

## 2020-01-02 DIAGNOSIS — D649 Anemia, unspecified: Secondary | ICD-10-CM | POA: Diagnosis not present

## 2020-01-03 ENCOUNTER — Encounter (INDEPENDENT_AMBULATORY_CARE_PROVIDER_SITE_OTHER): Payer: Medicare Other | Admitting: Ophthalmology

## 2020-01-05 ENCOUNTER — Ambulatory Visit (INDEPENDENT_AMBULATORY_CARE_PROVIDER_SITE_OTHER): Payer: Medicare Other | Admitting: Ophthalmology

## 2020-01-05 ENCOUNTER — Encounter (INDEPENDENT_AMBULATORY_CARE_PROVIDER_SITE_OTHER): Payer: Self-pay | Admitting: Ophthalmology

## 2020-01-05 ENCOUNTER — Other Ambulatory Visit: Payer: Self-pay

## 2020-01-05 DIAGNOSIS — H353211 Exudative age-related macular degeneration, right eye, with active choroidal neovascularization: Secondary | ICD-10-CM | POA: Diagnosis not present

## 2020-01-05 MED ORDER — AFLIBERCEPT 2MG/0.05ML IZ SOLN FOR KALEIDOSCOPE
2.0000 mg | INTRAVITREAL | Status: AC | PRN
  Administered 2020-01-05: 15:00:00 2 mg via INTRAVITREAL

## 2020-01-05 NOTE — Progress Notes (Signed)
01/05/2020     CHIEF COMPLAINT Patient presents for Retina Follow Up (10 WK FU OD, POSS EYLEA OD///Pt reports stable vision OD, no new F/F OD, no pain or pressure OD. )   HISTORY OF PRESENT ILLNESS: Allison Norman is a 86 y.o. female who presents to the clinic today for:   HPI    Retina Follow Up    Patient presents with  Wet AMD.  In right eye.  This started 10 weeks ago.  Severity is mild.  Duration of 10 weeks.  Since onset it is stable. Additional comments: 10 WK FU OD, POSS EYLEA OD   Pt reports stable vision OD, no new F/F OD, no pain or pressure OD.        Last edited by Nichola Sizer D on 01/05/2020  2:28 PM. (History)      Referring physician: [name], MD [place],  [place] [id number]  HISTORICAL INFORMATION:   Selected notes from the [facility]: No current outpatient medications on file. (Ophthalmic Drugs)   No current facility-administered medications for this visit. (Ophthalmic Drugs)   Current Outpatient Medications (Other)  Medication Sig  . amiodarone (PACERONE) 200 MG tablet Take 0.5 tablets (100 mg total) by mouth daily.  [name] amLODipine (NORVASC) 5 MG tablet Take 1 tablet (5 mg total) by mouth daily.  . Bevacizumab (AVASTIN IV) Place into the right eye every 6 (six) weeks.   . carvedilol (COREG) 3.125 MG tablet Take 1 tablet (3.125 mg total) by mouth 2 (two) times daily.  . cloNIDine (CATAPRES) 0.2 MG tablet Take 1 tablet (0.2 mg total) by mouth 3 (three) times daily.  . diazepam (VALIUM) 2 MG tablet Take 2 mg by mouth at bedtime as needed (for sleep).   [name] diltiazem (CARDIZEM CD) 180 MG 24 hr capsule Take 1 capsule (180 mg total) by mouth daily.  [name] ELIQUIS 2.5 MG TABS tablet Take 1 tablet by mouth twice daily  . EQ Fiber Supplement 2 g CHEW Chew 2 tablets by mouth daily.  . furosemide (LASIX) 20 MG tablet Take 20 mg by mouth as needed.  [name] losartan (COZAAR) 100 MG tablet Take 100 mg by  mouth daily. Per PCP   . minoxidil (LONITEN) 2.5 MG tablet Take 2 tablets (5 mg total) by mouth 2 (two) times daily.  . Multiple Vitamins-Minerals (PRESERVISION AREDS 2) CAPS Take 1 capsule by mouth daily.   No current facility-administered medications for this visit. (Other)      REVIEW OF SYSTEMS:    ALLERGIES Allergies  Allergen Reactions  . Codeine Itching  . Penicillins Itching, Rash and Other (See Comments)    Has patient had a PCN reaction causing immediate rash, facial/tongue/throat swelling, SOB or lightheadedness with hypotension: Yes Has patient had a PCN reaction causing severe rash involving mucus membranes or skin necrosis: No Has patient had a PCN reaction that required hospitalization: No Has patient had a PCN reaction occurring within the last 10 years: No If all of the above answers are "NO", then may proceed with Cephalosporin use.  . Doxazosin     Heavy feeling in chest, congestion, wheezing  . Doxycycline Other (See Comments)    Cause chest tightness  . Hydralazine     Felt bad and could hear pulse in head     PAST MEDICAL HISTORY Past Medical History:  Diagnosis Date  . Atrial fibrillation ([facility]) 2017   a.  s/p DCCV in 10/2015  b. recurrent in 08/2016 --> rate-control pursued.   . Cancer ([facility])    Breast  . CKD (chronic kidney disease)   . GCA (giant cell arteritis) ([place])   . Hypertension   . Macular degeneration   . Osteoporosis   . PMR (polymyalgia rheumatica) (HCC)   . Resistant hypertension 03/15/2019  . Shortness of breath 03/15/2019  . Skin cancer    Past Surgical History:  Procedure Laterality Date  . CARDIOVERSION N/A 10/18/2015   Procedure: CARDIOVERSION;  Surgeon: Jerline Pain, MD;  Location: [facility];  Service: Cardiovascular;  Laterality: N/A;  . CARDIOVERSION N/A 02/20/2017   Procedure: CARDIOVERSION;  Surgeon: Pixie Casino, MD;  Location: [facility] ENDOSCOPY;  Service: Cardiovascular;  Laterality: N/A;  . CARDIOVERSION N/A 06/18/2017    Procedure: CARDIOVERSION;  Surgeon: Sanda Klein, MD;  Location: [facility] ENDOSCOPY;  Service: Cardiovascular;  Laterality: N/A;  . CARDIOVERSION N/A 12/21/2018   Procedure: CARDIOVERSION;  Surgeon: Thayer Headings, MD;  Location: [facility] ENDOSCOPY;  Service: Cardiovascular;  Laterality: N/A;  . KNEE SURGERY  2013  . MASTECTOMY  1998   left side  . PACEMAKER IMPLANT N/A 07/22/2019   Procedure: PACEMAKER IMPLANT;  Surgeon: Evans Lance, MD;  Location: [facility] CV LAB;  Service: Cardiovascular;  Laterality: N/A;    FAMILY HISTORY Family History  Problem Relation Age of Onset  . Stroke Mother   . Kidney failure Father        died at 16  . Heart disease Sister        died at 24  . Heart disease Sister        died at 18  . Breast cancer Daughter     SOCIAL HISTORY Social History   Tobacco Use  . Smoking status: Former Smoker    Quit date: 08/31/1966    Years since quitting: 53.3  . Smokeless tobacco: Never Used  Vaping Use  . Vaping Use: Never used  Substance Use Topics  . Alcohol use: No  . Drug use: No         OPHTHALMIC EXAM: Base Eye Exam    Visual Acuity (ETDRS)      Right Left   Dist cc 20/50 +2 CF at 3'   Dist ph cc 20/40 -2 NI   Correction: Glasses       Tonometry (Tonopen, 2:33 PM)      Right Left   Pressure 13 17       Pupils      Pupils Dark Light Shape React APD   Right PERRL 4 3 Round Sluggish None   Left PERRL 4 3 Round Sluggish None       Visual Fields (Counting fingers)      Left Right     Full   Restrictions Partial outer superior nasal deficiency        Extraocular Movement      Right Left    Full Full       Neuro/Psych    Oriented x3: Yes   Mood/Affect: Normal       Dilation    Right eye: 1.0% Mydriacyl, 2.5% Phenylephrine @ 2:33 PM        Slit Lamp and Fundus Exam    External Exam      Right Left   External Normal Normal       Slit Lamp Exam      Right Left   Lids/Lashes Normal  internal Hordeolum - lower lid,  Ectropion, 1+ Collarettes   Conjunctiva/Sclera White and quiet White and quiet   Cornea Clear Clear   Anterior Chamber Deep and quiet Deep and quiet   Iris Round and reactive Round and reactive   Lens Posterior chamber intraocular lens, Open posterior capsule Posterior chamber intraocular lens, Open posterior capsule   Anterior Vitreous Normal Normal       Fundus Exam      Right Left   Posterior Vitreous Normal    Disc Normal    C/D Ratio 0.45    Macula Geographic atrophy, Advanced age related macular degeneration, Disciform scar nasal to faz, , Drusen, Retinal pigment epithelial mottling    Vessels Normal    Periphery Normal           IMAGING AND PROCEDURES  Imaging and Procedures for 01/05/20  OCT, Retina - OU - Both Eyes       Right Eye Quality was good. Scan locations included subfoveal. Central Foveal Thickness: 349. Progression has improved. Findings include intraretinal fluid, vitreomacular adhesion , pigment epithelial detachment, subretinal scarring, disciform scar.   Left Eye Quality was borderline. Scan locations included subfoveal. Central Foveal Thickness: 297. Progression has been stable. Findings include no SRF, retinal drusen , outer retinal atrophy, central retinal atrophy, subretinal hyper-reflective material, subretinal scarring.   Notes Pigment epithelial detachment with intraretinal fluid nasal to the fovea OD is stable,, at 10-week interval follow-up today  OS with advanced dry atrophic ARMD stable, no change in thickness OS, the computer selected lower baseline       Intravitreal Injection, Pharmacologic Agent - OD - Right Eye       Time Out 01/05/2020. 3:06 PM. Confirmed correct patient, procedure, site, and patient consented.   Anesthesia Topical anesthesia was used. Anesthetic medications included Akten 3.5%.   Procedure Preparation included Tobramycin 0.3%, 10% betadine to eyelids, 5% betadine to ocular surface, Ofloxacin . A 30 gauge  needle was used.   Injection:  2 mg aflibercept Alfonse Flavors) SOLN   NDC: [id number], Lot: [id number]   Route: Intravitreal, Site: Right Eye, Waste: 0 mg  Post-op Post injection exam found visual acuity of at least counting fingers. The patient tolerated the procedure well. There were no complications. The patient received written and verbal post procedure care education. Post injection medications were not given.                 ASSESSMENT/PLAN:  Exudative age-related macular degeneration of right eye with active choroidal neovascularization (HCC) OD, active CNVM in the past, with multiple recurrences in this monocular patient, stable acuity 20/50 now at 10-week follow-up interval.  Intraretinal fluid is present, thus repeat injection Eylea today and maintain 10-week interval follow-up      ICD-10-CM   1. Exudative age-related macular degeneration of right eye with active choroidal neovascularization (HCC)  H35.3211 OCT, Retina - OU - Both Eyes    Intravitreal Injection, Pharmacologic Agent - OD - Right Eye    aflibercept (EYLEA) SOLN 2 mg    1.  Chronically active and recurrent CNVM OD, now at 10-week follow-up interval.  On intravitreal Eylea repeat today  2.  Dilate OU next and evaluate OD for repeat Eylea next, 10 weeks  3.  Ophthalmic Meds Ordered this visit:  Meds ordered this encounter  Medications  . aflibercept (EYLEA) SOLN 2 mg       Return in about 10 weeks (around 03/15/2020) for DILATE OU, OD, EYLEA OCT.  There are no Patient Instructions on file for this  visit.   Explained the diagnoses, plan, and follow up with the patient and they expressed understanding.  Patient expressed understanding of the importance of proper follow up care.   Clent Demark Cobey Raineri M.D. Diseases & Surgery of the Retina and Vitreous Retina [facility] Diabetic [place] 01/05/20     Abbreviations: M myopia (nearsighted); A astigmatism; H hyperopia (farsighted); P presbyopia; Mrx spectacle  prescription;  CTL contact lenses; OD right eye; OS left eye; OU both eyes  XT exotropia; ET esotropia; PEK punctate epithelial keratitis; PEE punctate epithelial erosions; DES dry eye syndrome; MGD meibomian gland dysfunction; ATs artificial tears; PFAT's preservative free artificial tears; Wall nuclear sclerotic cataract; PSC posterior subcapsular cataract; ERM epi-retinal membrane; PVD posterior vitreous detachment; RD retinal detachment; DM diabetes mellitus; DR diabetic retinopathy; NPDR non-proliferative diabetic retinopathy; PDR proliferative diabetic retinopathy; CSME clinically significant macular edema; DME diabetic macular edema; dbh dot blot hemorrhages; CWS cotton wool spot; POAG primary open angle glaucoma; C/D cup-to-disc ratio; HVF humphrey visual field; GVF goldmann visual field; OCT optical coherence tomography; IOP intraocular pressure; BRVO Branch retinal vein occlusion; CRVO central retinal vein occlusion; CRAO central retinal artery occlusion; BRAO branch retinal artery occlusion; RT retinal tear; SB scleral buckle; PPV pars plana vitrectomy; VH Vitreous hemorrhage; PRP panretinal laser photocoagulation; IVK intravitreal kenalog; VMT vitreomacular traction; MH Macular hole;  NVD neovascularization of the disc; NVE neovascularization elsewhere; AREDS age related eye disease study; ARMD age related macular degeneration; POAG primary open angle glaucoma; EBMD epithelial/anterior basement membrane dystrophy; ACIOL anterior chamber intraocular lens; IOL intraocular lens; PCIOL posterior chamber intraocular lens; Phaco/IOL phacoemulsification with intraocular lens placement; [place] photorefractive keratectomy; LASIK laser assisted in situ keratomileusis; HTN hypertension; DM diabetes mellitus; COPD chronic obstructive pulmonary disease

## 2020-01-05 NOTE — Assessment & Plan Note (Addendum)
OD, active CNVM in the past, with multiple recurrences in this monocular patient, stable acuity 20/50 now at 10-week follow-up interval.  Intraretinal fluid is present, thus repeat injection Eylea today and maintain 10-week interval follow-up

## 2020-01-11 ENCOUNTER — Other Ambulatory Visit: Payer: Self-pay

## 2020-01-11 ENCOUNTER — Ambulatory Visit (INDEPENDENT_AMBULATORY_CARE_PROVIDER_SITE_OTHER): Payer: Medicare Other | Admitting: Cardiovascular Disease

## 2020-01-11 ENCOUNTER — Telehealth: Payer: Self-pay | Admitting: Cardiovascular Disease

## 2020-01-11 ENCOUNTER — Encounter: Payer: Self-pay | Admitting: Cardiovascular Disease

## 2020-01-11 VITALS — BP 190/78 | HR 60 | Ht 65.5 in | Wt 141.4 lb

## 2020-01-11 DIAGNOSIS — R0989 Other specified symptoms and signs involving the circulatory and respiratory systems: Secondary | ICD-10-CM

## 2020-01-11 DIAGNOSIS — R011 Cardiac murmur, unspecified: Secondary | ICD-10-CM | POA: Diagnosis not present

## 2020-01-11 DIAGNOSIS — I480 Paroxysmal atrial fibrillation: Secondary | ICD-10-CM

## 2020-01-11 DIAGNOSIS — R0602 Shortness of breath: Secondary | ICD-10-CM

## 2020-01-11 DIAGNOSIS — I10 Essential (primary) hypertension: Secondary | ICD-10-CM | POA: Diagnosis not present

## 2020-01-11 DIAGNOSIS — N184 Chronic kidney disease, stage 4 (severe): Secondary | ICD-10-CM

## 2020-01-11 MED ORDER — CLONIDINE HCL 0.3 MG PO TABS: 0.3000 mg | ORAL_TABLET | Freq: Three times a day (TID) | ORAL | 1 refills | Status: DC

## 2020-01-11 NOTE — Telephone Encounter (Signed)
Routed to MD to review patient's concerns about testing and advise

## 2020-01-11 NOTE — Progress Notes (Signed)
Hypertension Clinic Follow Up Assessment:    Date:  01/11/2020   ID:  Allison Norman, DOB 1924-10-30, MRN [id number]  PCP:  [name], MD  Cardiologist:  Pixie Casino, MD    Referring MD: [name], MD   CC: Hypertension  History of Present Illness:    Allison Norman is a 87 y.o. female with a hx of paroxysmal atrial fibrillation, hypertension, and CKD IV here for follow up. She initially established care in the hypertension clinic 02/2019.  She reports that she was diagnosed with hypertension several years ago.  Prior to developing atrial fibrillation her BP was better controlled.  Her medications were changed for improved rate control.  She has been working with [name], PA-C for her hypertension lately.  Amlodipine was increased to 10mg  but she developed LE edema, so amlodipine was reduced back to 5mg  and lasix was started.  Sh only uses lasix sporadically.  She noted that she sometimes forgets to take her evening dose of clonidine.  At that time hydralazine was increased to 50 mg 3 times daily.  She was also started on doxazosin.  Metoprolol was switched to carvedilol.  Doxazosin was discontinued due to shortness of breath and heaviness in her chest.  Of note, she had also stopped taking her furosemide.  Once this restarted her breathing improved.  Her blood pressure in the office seems to be consistently elevated.  When she last our pharmacist on 06/2019 her average blood pressure in the morning was 143/75 and 139/75 in the evenings.  She was started on minoxidil and the dose was increased to 10 mg.  Since that time she has stopped both hydralazine and doxazosin due to intolerances..  At her last appointment she was started on carvedilol 12.5 mg.  She noted pulsing in her ears that didn't improve with flonase and Claritin.  Her dose of carvedilol was reduced and pulsing sensation in her ears has improved but is still somewhat present.  She was started on diltiazem but  did not tolerate due to lower extremity edema.  She continues to try to work on limiting her sodium intake though she did have some hand over the holidays.  She notes that when she checks her pulse ox at [facility] at night it is sometimes in the 80s.  She had abnormal pulmonary function test in 2019 and has never seen pulmonology.  She smoked for about 20 years but stopped 15 years ago.  At home lately her blood pressure has been in the 150s to 170s.  Previous antihypertensives: Amlodipine- edema at 10 mg Doxazosin Hydralazine Diltiazem- leg swelling  Past Medical History:  Diagnosis Date  . Atrial fibrillation ([place]) 2017   a. s/p DCCV in 10/2015  b. recurrent in 08/2016 --> rate-control pursued.   . Cancer ([facility])    Breast  . CKD (chronic kidney disease)   . GCA (giant cell arteritis) ([place])   . Hypertension   . Macular degeneration   . Osteoporosis   . PMR (polymyalgia rheumatica) (HCC)   . Resistant hypertension 03/15/2019  . Shortness of breath 03/15/2019  . Skin cancer     Past Surgical History:  Procedure Laterality Date  . CARDIOVERSION N/A 10/18/2015   Procedure: CARDIOVERSION;  Surgeon: Jerline Pain, MD;  Location: [facility];  Service: Cardiovascular;  Laterality: N/A;  . CARDIOVERSION N/A 02/20/2017   Procedure: CARDIOVERSION;  Surgeon: Pixie Casino, MD;  Location: [facility];  Service: Cardiovascular;  Laterality: N/A;  .  CARDIOVERSION N/A 06/18/2017   Procedure: CARDIOVERSION;  Surgeon: Sanda Klein, MD;  Location: [facility] ENDOSCOPY;  Service: Cardiovascular;  Laterality: N/A;  . CARDIOVERSION N/A 12/21/2018   Procedure: CARDIOVERSION;  Surgeon: Thayer Headings, MD;  Location: [facility];  Service: Cardiovascular;  Laterality: N/A;  . KNEE SURGERY  2013  . MASTECTOMY  1998   left side  . PACEMAKER IMPLANT N/A 07/22/2019   Procedure: PACEMAKER IMPLANT;  Surgeon: Evans Lance, MD;  Location: [facility] CV LAB;  Service: Cardiovascular;  Laterality: N/A;     Current Medications: Current Meds  Medication Sig  . amiodarone (PACERONE) 200 MG tablet Take 0.5 tablets (100 mg total) by mouth daily.  [name] amLODipine (NORVASC) 5 MG tablet Take 1 tablet (5 mg total) by mouth daily.  . Bevacizumab (AVASTIN IV) Place into the right eye every 6 (six) weeks.   . carvedilol (COREG) 3.125 MG tablet Take 1 tablet (3.125 mg total) by mouth 2 (two) times daily.  . cloNIDine (CATAPRES) 0.3 MG tablet Take 1 tablet (0.3 mg total) by mouth 3 (three) times daily.  . diazepam (VALIUM) 2 MG tablet Take 2 mg by mouth at bedtime as needed (for sleep).   [name] ELIQUIS 2.5 MG TABS tablet Take 1 tablet by mouth twice daily  . EQ Fiber Supplement 2 g CHEW Chew 2 tablets by mouth daily.  . furosemide (LASIX) 20 MG tablet Take 20 mg by mouth as needed.  [name] losartan (COZAAR) 100 MG tablet Take 100 mg by mouth daily. Per PCP  . minoxidil (LONITEN) 2.5 MG tablet Take 2 tablets (5 mg total) by mouth 2 (two) times daily.  . Multiple Vitamins-Minerals (PRESERVISION AREDS 2) CAPS Take 1 capsule by mouth daily.  . [DISCONTINUED] cloNIDine (CATAPRES) 0.2 MG tablet Take 1 tablet (0.2 mg total) by mouth 3 (three) times daily.     Allergies:   Codeine, Penicillins, Doxazosin, Doxycycline, Doxycycline hyclate, and Hydralazine   Social History   Socioeconomic History  . Marital status: Married    Spouse name: Not on file  . Number of children: 2  . Years of education: Not on file  . Highest education level: Not on file  Occupational History  . Not on file  Tobacco Use  . Smoking status: Former Smoker    Quit date: 08/31/1966    Years since quitting: 53.4  . Smokeless tobacco: Never Used  Vaping Use  . Vaping Use: Never used  Substance and Sexual Activity  . Alcohol use: No  . Drug use: No  . Sexual activity: Not on file  Other Topics Concern  . Not on file  Social History Narrative   epworth sleepiness scale = 8 (08/31/15)   Social Determinants of Health   Financial  Resource Strain: Not on file  Food Insecurity: Not on file  Transportation Needs: Not on file  Physical Activity: Not on file  Stress: Not on file  Social Connections: Not on file     Family History: The patient's family history includes Breast cancer in her daughter; Heart disease in her sister and sister; Kidney failure in her father; Stroke in her mother.  ROS:   Please see the history of present illness.     All other systems reviewed and are negative.  EKGs/Labs/Other Studies Reviewed:    EKG:  EKG is not ordered today.  Recent Labs: 05/30/2019: ALT 48; TSH 2.264 07/05/2019: BUN 36; Creatinine, Ser 1.99; Hemoglobin 11.7; Platelets 239; Potassium 4.7; Sodium 138   Recent Lipid  Panel    Component Value Date/Time   CHOL  07/04/2009 0418    81        ATP III CLASSIFICATION:  <200     mg/dL   Desirable  200-239  mg/dL   Borderline High  >=240    mg/dL   High          TRIG 64 07/04/2009 0418   HDL 18 (L) 07/04/2009 0418   CHOLHDL 4.5 07/04/2009 0418   VLDL 13 07/04/2009 0418   LDLCALC  07/04/2009 0418    50        Total Cholesterol/HDL:CHD Risk Coronary Heart Disease Risk Table                     Men   Women  1/2 Average Risk   3.4   3.3  Average Risk       5.0   4.4  2 X Average Risk   9.6   7.1  3 X Average Risk  23.4   11.0        Use the calculated Patient Ratio above and the CHD Risk Table to determine the patient's CHD Risk.        ATP III CLASSIFICATION (LDL):  <100     mg/dL   Optimal  100-129  mg/dL   Near or Above                    Optimal  130-159  mg/dL   Borderline  160-189  mg/dL   High  >190     mg/dL   Very High    Physical Exam:      Wt Readings from Last 3 Encounters:  01/11/20 141 lb 6.4 oz (64.1 kg)  11/29/19 144 lb 3.2 oz (65.4 kg)  11/01/19 140 lb 9.6 oz (63.8 kg)    VS:  BP (!) 190/78 (BP Location: Right Arm)   Pulse 60   Ht 5' 5.5" (1.664 m)   Wt 141 lb 6.4 oz (64.1 kg)   SpO2 92%   BMI 23.17 kg/m  , BMI Body mass index  is 23.17 kg/m. GENERAL:  Well appearing HEENT: Pupils equal round and reactive, fundi not visualized, oral mucosa unremarkable NECK:  No jugular venous distention, waveform within normal limits, carotid upstroke brisk and symmetric, R carotid bruit LUNGS:  Clear to auscultation bilaterally HEART:  RRR.  PMI not displaced or sustained,S1 and S2 within normal limits, no S3, no S4, no clicks, no rubs, III/VI systolic murmur at the RUSB ABD:  Flat, positive bowel sounds normal in frequency in pitch, no bruits, no rebound, no guarding, no midline pulsatile mass, no hepatomegaly, no splenomegaly EXT:  2 plus pulses throughout, 1+ LE edema, no cyanosis no clubbing SKIN:  No rashes no nodules NEURO:  Cranial nerves II through XII grossly intact, motor grossly intact throughout PSYCH:  Cognitively intact, oriented to person place and time   ASSESSMENT:    1. Bruit   2. Murmur   3. Essential hypertension   4. PAF (paroxysmal atrial fibrillation) ([place])   5. Resistant hypertension   6. CRI (chronic renal insufficiency), stage 4 (severe) (HCC)   7. Shortness of breath     PLAN:    # Resistant hypertension: Blood pressure remains poorly controlled despite being on several medications.  Her renal function limits options as do her intolerances.  She did not tolerate higher doses of carvedilol or diltiazem.  She notes that she did well  on clonidine at higher doses in the past.  We will increase it to 0.3 mg 3 times a day.  Continue amlodipine, carvedilol, losartan, and minoxidil as ordered.  We discussed limiting salt and following the DASH diet.  Renal artery Dopplers were normal. She was encouraged to start back walking more with a walker/cane for stability.   Secondary Causes of Hypertension  Medications/Herbal: OCP, steroids, stimulants, antidepressants, weight loss medication, immune suppressants, NSAIDs, sympathomimetics, alcohol, caffeine, licorice, ginseng, St. John's wort, chemo (none  identified)  Sleep Apnea: (testing not indicated)  Renal artery stenosis: Renal Doppler normal 03/2019 Hyperaldosteronism: hyperkalemia Hyper/hypothyroidism: TSH normal 05/2019 Pheochromocytoma: testing not indicated Cushing's syndrome: testing not indicated Coarctation of the aorta: BP symmetric  # LE Edema: # Shortness of breath: Volume stable.  She is taking lasix only as needed.    # Murmur:   Minimal to medical murmur concerning for aortic stenosis.  She previously had aortic valve sclerosis in 2017.  We will check an echocardiogram.  # Carotid bruit: Patient reports a pulsing sensation in her ears.  She has a carotid bruit on exam.  Carotid Doppler.   Disposition:    FU with MD/PharmD in 4-6 weeks   Medication Adjustments/Labs and Tests Ordered: Current medicines are reviewed at length with the patient today.  Concerns regarding medicines are outlined above.  Orders Placed This Encounter  Procedures  . ECHOCARDIOGRAM COMPLETE  . VAS US CAROTID   Meds ordered this encounter  Medications  . cloNIDine (CATAPRES) 0.3 MG tablet    Sig: Take 1 tablet (0.3 mg total) by mouth 3 (three) times daily.    Dispense:  180 tablet    Refill:  1    NEW DOSE, PATIENT WILL CALL WHEN NEEDS FILLED     Signed, Skeet Latch, MD  01/11/2020 4:38 PM    [place]

## 2020-01-11 NOTE — Telephone Encounter (Signed)
Patient states she does not want to have her carotid or echo tests done if she does not have to. She states she is 66 and does not want to have any surgeries. She would like to know if something is found on those tests, if she would need surgery. She states if so she does not want to have the tests done.

## 2020-01-11 NOTE — Patient Instructions (Addendum)
Medication Instructions:  INCREASE YOUR CLONIDINE TO 0.3 MG THREE TIMES A DAY   Labwork: NONE  Testing/Procedures: Your physician has requested that you have an echocardiogram. Echocardiography is a painless test that uses sound waves to create images of your heart. It provides your doctor with information about the size and shape of your heart and how well your heart's chambers and valves are working. This procedure takes approximately one hour. There are no restrictions for this procedure. [place] [address]  Your physician has requested that you have a carotid duplex. This test is an ultrasound of the carotid arteries in your neck. It looks at blood flow through these arteries that supply the brain with blood. Allow one hour for this exam. There are no restrictions or special instructions.  Follow-Up:   02/13/2020 AT 1:30 WITH DR [facility]

## 2020-01-13 NOTE — Telephone Encounter (Signed)
Patient called w/MD advice. She plans to proceed w/upcoming carotid doppler - will think about echo

## 2020-01-13 NOTE — Telephone Encounter (Signed)
I agree she would not be a candidate for any surgeries.  She could be a candidate for procedures that are less invasive.  However she is not interested in that it is totally reasonable and she does not have to have the studies.  Sometimes knowing what issues are going on could affect what medications we use even if she does not decide to proceed with any surgeries or procedures.

## 2020-01-17 ENCOUNTER — Ambulatory Visit (HOSPITAL_COMMUNITY)
Admission: RE | Admit: 2020-01-17 | Discharge: 2020-01-17 | Disposition: A | Discharge status: Home / Self Care | Payer: Medicare Other | Source: Ambulatory Visit | Attending: Cardiovascular Disease | Admitting: Cardiovascular Disease

## 2020-01-17 ENCOUNTER — Other Ambulatory Visit: Payer: Self-pay

## 2020-01-17 DIAGNOSIS — R0989 Other specified symptoms and signs involving the circulatory and respiratory systems: Secondary | ICD-10-CM | POA: Insufficient documentation

## 2020-01-20 ENCOUNTER — Ambulatory Visit (INDEPENDENT_AMBULATORY_CARE_PROVIDER_SITE_OTHER): Payer: Medicare Other

## 2020-01-20 DIAGNOSIS — I495 Sick sinus syndrome: Secondary | ICD-10-CM

## 2020-01-23 LAB — CUP PACEART REMOTE DEVICE CHECK
Battery Remaining Longevity: 108 mo
Battery Remaining Percentage: 100 %
Brady Statistic RA Percent Paced: 75 %
Brady Statistic RV Percent Paced: 1 %
Date Time Interrogation Session: 20220114022100
Implantable Lead Implant Date: 20210716
Implantable Lead Implant Date: 20210716
Implantable Lead Location: 753859
Implantable Lead Location: 753860
Implantable Lead Model: 7840
Implantable Lead Model: 7841
Implantable Lead Serial Number: 1017229
Implantable Lead Serial Number: 1090224
Implantable Pulse Generator Implant Date: 20210716
Lead Channel Impedance Value: 608 Ohm
Lead Channel Impedance Value: 699 Ohm
Lead Channel Pacing Threshold Amplitude: 0.6 V
Lead Channel Pacing Threshold Amplitude: 0.7 V
Lead Channel Pacing Threshold Pulse Width: 0.4 ms
Lead Channel Pacing Threshold Pulse Width: 0.4 ms
Lead Channel Setting Pacing Amplitude: 2 V
Lead Channel Setting Pacing Amplitude: 2.5 V
Lead Channel Setting Pacing Pulse Width: 0.4 ms
Lead Channel Setting Sensing Sensitivity: 2.5 mV
Pulse Gen Serial Number: 547553

## 2020-01-30 ENCOUNTER — Other Ambulatory Visit: Payer: Self-pay

## 2020-01-30 ENCOUNTER — Ambulatory Visit (HOSPITAL_COMMUNITY): Payer: Medicare Other | Attending: Cardiology

## 2020-01-30 DIAGNOSIS — R011 Cardiac murmur, unspecified: Secondary | ICD-10-CM | POA: Insufficient documentation

## 2020-01-30 LAB — ECHOCARDIOGRAM COMPLETE
AR max vel: 1.17 cm2
AV Area VTI: 1.42 cm2
AV Area mean vel: 1.24 cm2
AV Mean grad: 12 mmHg
AV Peak grad: 18.8 mmHg
Ao pk vel: 2.17 m/s
S' Lateral: 2.7 cm

## 2020-02-02 ENCOUNTER — Other Ambulatory Visit: Payer: Self-pay | Admitting: Internal Medicine

## 2020-02-02 DIAGNOSIS — I4891 Unspecified atrial fibrillation: Secondary | ICD-10-CM

## 2020-02-04 NOTE — Progress Notes (Signed)
Remote pacemaker transmission.   

## 2020-02-07 ENCOUNTER — Telehealth: Payer: Self-pay | Admitting: Cardiovascular Disease

## 2020-02-07 NOTE — Telephone Encounter (Signed)
Patient returning call in regards to echo results.

## 2020-02-07 NOTE — Telephone Encounter (Signed)
Advised patient of Echo results and medication change Patient will keep follow up next week

## 2020-02-07 NOTE — Telephone Encounter (Signed)
-----   Message from Skeet Latch, MD sent at 02/03/2020 11:25 AM EST ----- Her heart squeezes well but doesn't relax completely.  The pressure on the right side of her heart is elevated and that is causing her tricuspid valve to leak.  Mild aortic stenosis.  Recommend that she start taking her lasix every day.  BMP in a week.

## 2020-02-13 ENCOUNTER — Ambulatory Visit: Payer: Medicare Other | Admitting: Cardiovascular Disease

## 2020-02-13 ENCOUNTER — Telehealth: Payer: Self-pay | Admitting: Cardiovascular Disease

## 2020-02-13 NOTE — Telephone Encounter (Signed)
Spoke with patient. BP has been running in the 170s in the afternoon. Patient wanting to keep 2/11 appointment with Denyse Amass. Informed patient appointment is still in the system and we will see her then. Requested she bring a copy of blood pressures along with her. Patient verbalized understanding.

## 2020-02-13 NOTE — Telephone Encounter (Signed)
Patient was schedule on a non HTN Clinic day in error. Called patient back to reschedule with Dr. Oval Linsey. I did not see anything available until march. Patient would like to know if she can keep her appointment with Coletta Memos 02/17/2020, because she states she needs to be seen by someone soon. Please advise.

## 2020-02-16 ENCOUNTER — Other Ambulatory Visit (HOSPITAL_COMMUNITY): Payer: Self-pay | Admitting: *Deleted

## 2020-02-16 MED ORDER — AMIODARONE HCL 100 MG PO TABS: 100.0000 mg | ORAL_TABLET | Freq: Every day | ORAL | 1 refills | Status: DC

## 2020-02-16 NOTE — Progress Notes (Signed)
Cardiology Clinic Note   Patient Name: Allison Norman Date of Encounter: 02/17/2020  Primary Care Provider:  [name], MD Primary Cardiologist:  Pixie Casino, MD  Patient Profile    Allison Limerick.  Norman 87 year old female presents the clinic today for an evaluation of her blood pressure.  Past Medical History    Past Medical History:  Diagnosis Date  . Atrial fibrillation ([place]) 2017   a. s/p DCCV in 10/2015  b. recurrent in 08/2016 --> rate-control pursued.   . Cancer ([facility])    Breast  . CKD (chronic kidney disease)   . GCA (giant cell arteritis) ([place])   . Hypertension   . Macular degeneration   . Osteoporosis   . PMR (polymyalgia rheumatica) ([facility])   . Resistant hypertension 03/15/2019  . Shortness of breath 03/15/2019  . Skin cancer    Past Surgical History:  Procedure Laterality Date  . CARDIOVERSION N/A 10/18/2015   Procedure: CARDIOVERSION;  Surgeon: Jerline Pain, MD;  Location: [facility];  Service: Cardiovascular;  Laterality: N/A;  . CARDIOVERSION N/A 02/20/2017   Procedure: CARDIOVERSION;  Surgeon: Pixie Casino, MD;  Location[facility] ENDOSCOPY;  Service: Cardiovascular;  Laterality: N/A;  . CARDIOVERSION N/A 06/18/2017   Procedure: CARDIOVERSION;  Surgeon: Sanda Klein, MD;  Location: [facility] ENDOSCOPY;  Service: Cardiovascular;  Laterality: N/A;  . CARDIOVERSION N/A 12/21/2018   Procedure: CARDIOVERSION;  Surgeon: Thayer Headings, MD;  Location: [facility] ENDOSCOPY;  Service: Cardiovascular;  Laterality: N/A;  . KNEE SURGERY  2013  . MASTECTOMY  1998   left side  . PACEMAKER IMPLANT N/A 07/22/2019   Procedure: PACEMAKER IMPLANT;  Surgeon: Evans Lance, MD;  Location: [facility] CV LAB;  Service: Cardiovascular;  Laterality: N/A;    Allergies  Allergies  Allergen Reactions  . Codeine Itching  . Penicillins Itching, Rash and Other (See Comments)    Has patient had a PCN reaction causing immediate rash, facial/tongue/throat swelling, SOB or  lightheadedness with hypotension: Yes Has patient had a PCN reaction causing severe rash involving mucus membranes or skin necrosis: No Has patient had a PCN reaction that required hospitalization: No Has patient had a PCN reaction occurring within the last 10 years: No If all of the above answers are "NO", then may proceed with Cephalosporin use.  . Doxazosin     Heavy feeling in chest, congestion, wheezing  . Doxycycline Other (See Comments)    Cause chest tightness  . Doxycycline Hyclate Other (See Comments)  . Hydralazine     Felt bad and could hear pulse in head     History of Present Illness   Allison Norman has a PMH of paroxysmal atrial fibrillation, HTN, CKD stage IV, and cardiac murmur.  She initially establish care in the hypertension clinic 2/21.  She reported she was diagnosed with hypertension several years ago.  Prior to developing atrial fibrillation her blood pressure was much better controlled.  She has been working with [name] PA-C on her HTN treatment.  Her amlodipine was increased to 10 mg but she developed lower extremity edema, her amlodipine was reduced back to 5 mg and Lasix was restarted.  She only uses Lasix sporadically.  She did note that she would occasionally not remember to take her evening dose of clonidine.  Her hydralazine was prescribed for 50 mg 3 times daily.  She was also started on doxazosin.  Her metoprolol was switched to carvedilol.  Her doxazosin was discontinued due to shortness of breath  and heaviness in her chest.  She also reported that she stopped taking furosemide.  When she restarted her breathing improved.  When seen in the office her blood pressure has been consistently elevated.  She is also seeing the pharmacist and was seen on 6/21 at that time her blood pressure was reported to be in the 140s over 70s in the morning and 130s over 70s and evening.  She was started on minoxidil and her dose was increased to 10 mg.  Since that time she had  stopped taking both her hydralazine and her doctor sister due to intolerances.  She was last seen by Dr. Oval Linsey on 01/11/2020.  During that time she reported pulses in her ears but did not improve with Flonase or Claritin.  Her dose of carvedilol was reduced and the pulsing sensation in her ears improved somewhat but was still present.  She was started on diltiazem but did not tolerate it due to to lower extremity edema.  She continued to try to limit her sodium intake.  She noted that when she would check her pulse ox at night sometimes it would show numbers in the 80s.  Of note she had a abnormal pulmonary function test in 2019 and has never followed up with pulmonology.  She has a history of smoking for 20 years but stopped 15 years ago.  She reported that her most recent blood pressures at home were running in the [id number]-[id number] systolic.  Previous antihypertensives: Amlodipine- edema at 10 mg Doxazosin Hydralazine Diltiazem- leg swelling  She contacted nurse triage line on 02/13/2020 and indicated that her blood pressures were running in the 237S systolic.  She was instructed to bring a copy of her blood pressures to her appointment.  She presents the clinic today for evaluation and states she feels well.  She has noticed that her blood pressure gradually elevates in the afternoon/evening.  She is noted blood pressures in the 150s-170s over 70s.  We reviewed her medications and she was not taking her minoxidil correctly.  She also reports that she occasionally has scratches/scrapes but due to her apixaban she is not able to get bleeding to stop very quickly.  We reviewed using Coban and using quick bandage so that she does not have to remove Band-Aids.  I will increase her minoxidil to 5 mg twice daily, give her the salty 6 diet sheet, order a BMP, and have her follow-up with pharmacy/hypertension clinic in 1 month.  Today she denies chest pain, shortness of breath, lower extremity edema, fatigue,  palpitations, melena, hematuria, hemoptysis, diaphoresis, weakness, presyncope, syncope, orthopnea, and PND.   Home Medications    Prior to Admission medications   Medication Sig Start Date End Date Taking? Authorizing Provider  amiodarone (PACERONE) 100 MG tablet Take 1 tablet (100 mg total) by mouth daily. 02/16/20   Sherran Needs, NP  amLODipine (NORVASC) 5 MG tablet Take 1 tablet (5 mg total) by mouth daily. 03/03/19   Skeet Latch, MD  Bevacizumab (AVASTIN IV) Place into the right eye every 6 (six) weeks.     [provider]  carvedilol (COREG) 3.125 MG tablet Take 1 tablet (3.125 mg total) by mouth 2 (two) times daily. 12/06/19 03/05/20  Skeet Latch, MD  cloNIDine (CATAPRES) 0.3 MG tablet Take 1 tablet (0.3 mg total) by mouth 3 (three) times daily. 01/11/20   Skeet Latch, MD  diazepam (VALIUM) 2 MG tablet Take 2 mg by mouth at bedtime as needed (for sleep).  06/23/16  [provider]  ELIQUIS 2.5 MG TABS tablet Take 1 tablet by mouth twice daily 02/02/20   Hilty, Nadean Corwin, MD  EQ Fiber Supplement 2 g CHEW Chew 2 tablets by mouth daily.    [provider]  furosemide (LASIX) 20 MG tablet Take 20 mg by mouth daily.    [provider]  losartan (COZAAR) 100 MG tablet Take 100 mg by mouth daily. Per PCP    [provider]  minoxidil (LONITEN) 2.5 MG tablet Take 2 tablets (5 mg total) by mouth 2 (two) times daily. 11/24/19   Rodriguez-Guzman, Raquel, RPH-CPP  Multiple Vitamins-Minerals (PRESERVISION AREDS 2) CAPS Take 1 capsule by mouth daily.    [provider]    Family History    Family History  Problem Relation Age of Onset  . Stroke Mother   . Kidney failure Father        died at 58  . Heart disease Sister        died at 62  . Heart disease Sister        died at 61  . Breast cancer Daughter    She indicated that her mother is deceased. She indicated that her father is deceased. She indicated that both of her  sisters are deceased. She indicated that the status of her daughter is unknown.  Social History    Social History   Socioeconomic History  . Marital status: Married    Spouse name: Not on file  . Number of children: 2  . Years of education: Not on file  . Highest education level: Not on file  Occupational History  . Not on file  Tobacco Use  . Smoking status: Former Smoker    Quit date: 08/31/1966    Years since quitting: 53.5  . Smokeless tobacco: Never Used  Vaping Use  . Vaping Use: Never used  Substance and Sexual Activity  . Alcohol use: No  . Drug use: No  . Sexual activity: Not on file  Other Topics Concern  . Not on file  Social History Narrative   epworth sleepiness scale = 8 (08/31/15)   Social Determinants of Health   Financial Resource Strain: Not on file  Food Insecurity: Not on file  Transportation Needs: Not on file  Physical Activity: Not on file  Stress: Not on file  Social Connections: Not on file  Intimate Partner Violence: Not on file     Review of Systems    General:  No chills, fever, night sweats or weight changes.  Cardiovascular:  No chest pain, dyspnea on exertion, edema, orthopnea, palpitations, paroxysmal nocturnal dyspnea. Dermatological: No rash, lesions/masses Respiratory: No cough, dyspnea Urologic: No hematuria, dysuria Abdominal:   No nausea, vomiting, diarrhea, bright red blood per rectum, melena, or hematemesis Neurologic:  No visual changes, wkns, changes in mental status. All other systems reviewed and are otherwise negative except as noted above.  Physical Exam    VS:  BP (!) 158/72   Pulse 65   Ht 5\' 5"  (1.651 m)   Wt 139 lb 6.4 oz (63.2 kg)   SpO2 97%   BMI 23.20 kg/m  , BMI Body mass index is 23.2 kg/m. GEN: Well nourished, well developed, in no acute distress. HEENT: normal. Neck: Supple, no JVD, carotid bruits, or masses. Cardiac: RRR, systolic murmur 3 out of 6 heard along right upper sternal border., rubs, or  gallops. No clubbing, cyanosis, edema.  Radials/DP/PT 2+ and equal bilaterally.  Respiratory:  Respirations  regular and unlabored, clear to auscultation bilaterally. GI: Soft, nontender, nondistended, BS + x 4. MS: no deformity or atrophy. Skin: warm and dry, no rash. Neuro:  Strength and sensation are intact. Psych: Normal affect.  Accessory Clinical Findings    Recent Labs: 05/30/2019: ALT 48; TSH 2.264 07/05/2019: BUN 36; Creatinine, Ser 1.99; Hemoglobin 11.7; Platelets 239; Potassium 4.7; Sodium 138   Recent Lipid Panel    Component Value Date/Time   CHOL  07/04/2009 0418    81        ATP III CLASSIFICATION:  <200     mg/dL   Desirable  200-239  mg/dL   Borderline High  >=240    mg/dL   High          TRIG 64 07/04/2009 0418   HDL 18 (L) 07/04/2009 0418   CHOLHDL 4.5 07/04/2009 0418   VLDL 13 07/04/2009 0418   LDLCALC  07/04/2009 0418    50        Total Cholesterol/HDL:CHD Risk Coronary Heart Disease Risk Table                     Men   Women  1/2 Average Risk   3.4   3.3  Average Risk       5.0   4.4  2 X Average Risk   9.6   7.1  3 X Average Risk  23.4   11.0        Use the calculated Patient Ratio above and the CHD Risk Table to determine the patient's CHD Risk.        ATP III CLASSIFICATION (LDL):  <100     mg/dL   Optimal  100-129  mg/dL   Near or Above                    Optimal  130-159  mg/dL   Borderline  160-189  mg/dL   High  >190     mg/dL   Very High    ECG personally reviewed by me today-none today.  Echocardiogram 01/30/2020  IMPRESSIONS    1. Left ventricular ejection fraction, by estimation, is 65 to 70%. The  left ventricle has normal function. The left ventricle has no regional  wall motion abnormalities. There is severe concentric left ventricular  hypertrophy. Left ventricular diastolic  parameters are consistent with Grade III diastolic dysfunction  (restrictive). Elevated left atrial pressure.  2. Right ventricular systolic  function is mildly reduced. The right  ventricular size is mildly enlarged. There is severely elevated pulmonary  artery systolic pressure. The estimated right ventricular systolic  pressure is 53.6 mmHg.  3. Left atrial size was severely dilated.  4. Right atrial size was severely dilated.  5. The mitral valve is normal in structure. Mild to moderate mitral valve  regurgitation. No evidence of mitral stenosis. Moderate mitral annular  calcification.  6. Tricuspid valve regurgitation is moderate to severe.  7. The aortic valve is normal in structure. There is severe calcifcation  of the aortic valve. There is severe thickening of the aortic valve.  Aortic valve regurgitation is not visualized. Mild aortic valve stenosis.  Aortic valve mean gradient measures  12.0 mmHg.  8. The inferior vena cava is normal in size with greater than 50%  respiratory variability, suggesting right atrial pressure of 3 mmHg.  Assessment & Plan   1.  Resistant hypertension-BP today 158/72.  Reports it has been elevated at home with systolics occasionally in  the 170s. Continue amlodipine 5 mg, losartan 100 mg, carvedilol 3.125 mg twice daily Increase minoxidil to 5 mg BID-was not taking correctly was only taking 2.5 mg twice daily Heart healthy low-sodium diet-salty 6 given Increase physical activity as tolerated Secondary causes of hypertension reviewed- Maintain blood pressure log  Sleep Apnea: (testing not indicated)  Renal artery stenosis: Renal Doppler normal 03/2019 Hyperaldosteronism: hyperkalemia Hyper/hypothyroidism: TSH normal 05/2019 Pheochromocytoma: testing not indicated Cushing's syndrome: testing not indicated Coarctation of the aorta: BP symmetric  Lower extremity edema/dyspnea on exertion-no increased DOE.  Generalized nonpitting bilateral lower extremity edema. Continue furosemide as needed Heart healthy low-sodium diet - salty 6 given Lower extremity support stockings-[place]  support stocking sheet given Increase physical activity as tolerated  Murmur-no increased DOE or activity tolerance.  Echocardiogram showed mild-moderate mitral valve regurgitation and tricuspid valve moderate-severe regurgitation, mild aortic valve stenosis, EF 65-70% and grade 3 diastolic dysfunction. Continue to monitor. Continue as needed furosemide Continue carvedilol Ordered BMP  Disposition: Follow-up with hypertension clinic pharmacist in 1 month.  Jossie Ng. Dreydon Cardenas NP-C    02/17/2020, 2:45 PM [facility] [place] [address] Office [phone] Fax [phone]  Notice: This dictation was prepared with Dragon dictation along with smaller phrase technology. Any transcriptional errors that result from this process are unintentional and may not be corrected upon review.  I spent 15 minutes examining this patient, reviewing medications, and using patient centered shared decision making involving her cardiac care.  Prior to her visit I spent greater than 20 minutes reviewing her past medical history,  medications, and prior cardiac tests.

## 2020-02-17 ENCOUNTER — Other Ambulatory Visit: Payer: Self-pay

## 2020-02-17 ENCOUNTER — Ambulatory Visit (INDEPENDENT_AMBULATORY_CARE_PROVIDER_SITE_OTHER): Payer: Medicare Other | Admitting: General Practice

## 2020-02-17 ENCOUNTER — Encounter: Payer: Self-pay | Admitting: General Practice

## 2020-02-17 VITALS — BP 158/72 | HR 65 | Ht 65.0 in | Wt 139.4 lb

## 2020-02-17 DIAGNOSIS — R0600 Dyspnea, unspecified: Secondary | ICD-10-CM

## 2020-02-17 DIAGNOSIS — I10 Essential (primary) hypertension: Secondary | ICD-10-CM | POA: Diagnosis not present

## 2020-02-17 DIAGNOSIS — Z79899 Other long term (current) drug therapy: Secondary | ICD-10-CM

## 2020-02-17 DIAGNOSIS — R600 Localized edema: Secondary | ICD-10-CM | POA: Diagnosis not present

## 2020-02-17 DIAGNOSIS — R011 Cardiac murmur, unspecified: Secondary | ICD-10-CM | POA: Diagnosis not present

## 2020-02-17 DIAGNOSIS — R0609 Other forms of dyspnea: Secondary | ICD-10-CM

## 2020-02-17 NOTE — Patient Instructions (Signed)
Medication Instructions:  Take minoxidil 2 in the am and 2 in the afternoon  *If you need a refill on your cardiac medications before your next appointment, please call your pharmacy*  Lab Work: BMET TODAY If you have labs (blood work) drawn today and your tests are completely normal, you will receive your results only by:  [place] (if you have MyChart) OR A paper copy in the mail.  If you have any lab test that is abnormal or we need to change your treatment, we will call you to review the results. You may go to any [facility] that is convenient for you however, we do have a lab in our office that is able to assist you. You DO NOT need an appointment for our lab. The lab is open 8:00am and closes at 4:00pm. Lunch 12:45 - 1:45pm.  Special Instructions  PLEASE READ AND FOLLOW SALTY 6-ATTACHED-1,800 mg daily  Follow-Up: Your next appointment:  1 month(s) In Person with [place], you and your health needs are our priority.  As part of our continuing mission to provide you with exceptional heart care, we have created designated Provider Care Teams.  These Care Teams include your primary Cardiologist (physician) and Advanced Practice Providers (APPs -  Physician Assistants and Nurse Practitioners) who all work together to provide you with the care you need, when you need it.

## 2020-02-18 LAB — BASIC METABOLIC PANEL
BUN/Creatinine Ratio: 18 (ref 12–28)
BUN: 32 mg/dL (ref 10–36)
CO2: 21 mmol/L (ref 20–29)
Calcium: 9.5 mg/dL (ref 8.7–10.3)
Chloride: 101 mmol/L (ref 96–106)
Creatinine, Ser: 1.79 mg/dL — ABNORMAL HIGH (ref 0.57–1.00)
GFR calc Af Amer: 28 mL/min/{1.73_m2} — ABNORMAL LOW (ref >59)
GFR calc non Af Amer: 24 mL/min/{1.73_m2} — ABNORMAL LOW (ref >59)
Glucose: 92 mg/dL (ref 65–99)
Potassium: 5 mmol/L (ref 3.5–5.2)
Sodium: 143 mmol/L (ref 134–144)

## 2020-02-20 ENCOUNTER — Telehealth: Payer: Self-pay | Admitting: Cardiovascular Disease

## 2020-02-20 MED ORDER — MINOXIDIL 2.5 MG PO TABS: 5.0000 mg | ORAL_TABLET | Freq: Two times a day (BID) | ORAL | 3 refills | Status: DC

## 2020-02-20 NOTE — Telephone Encounter (Signed)
°*  STAT* If patient is at the pharmacy, call can be transferred to refill team.   1. Which medications need to be refilled? (please list name of each medication and dose if known) minoxidil (LONITEN) 2.5 MG tablet  2. Which pharmacy/location (including street and city if local pharmacy) is medication to be sent to? [facility] [place]  3. Do they need a 30 day or 90 day supply? 90 day supply

## 2020-02-21 ENCOUNTER — Other Ambulatory Visit (HOSPITAL_COMMUNITY): Payer: Self-pay | Admitting: *Deleted

## 2020-02-21 MED ORDER — AMIODARONE HCL 200 MG PO TABS: 100.0000 mg | ORAL_TABLET | Freq: Every day | ORAL | 2 refills | Status: DC

## 2020-03-15 ENCOUNTER — Encounter (INDEPENDENT_AMBULATORY_CARE_PROVIDER_SITE_OTHER): Payer: Self-pay | Admitting: Ophthalmology

## 2020-03-15 ENCOUNTER — Ambulatory Visit (INDEPENDENT_AMBULATORY_CARE_PROVIDER_SITE_OTHER): Payer: Medicare Other | Admitting: Ophthalmology

## 2020-03-15 ENCOUNTER — Other Ambulatory Visit: Payer: Self-pay

## 2020-03-15 DIAGNOSIS — H00025 Hordeolum internum left lower eyelid: Secondary | ICD-10-CM | POA: Diagnosis not present

## 2020-03-15 DIAGNOSIS — H353113 Nonexudative age-related macular degeneration, right eye, advanced atrophic without subfoveal involvement: Secondary | ICD-10-CM

## 2020-03-15 DIAGNOSIS — H353211 Exudative age-related macular degeneration, right eye, with active choroidal neovascularization: Secondary | ICD-10-CM | POA: Diagnosis not present

## 2020-03-15 DIAGNOSIS — H353124 Nonexudative age-related macular degeneration, left eye, advanced atrophic with subfoveal involvement: Secondary | ICD-10-CM

## 2020-03-15 MED ORDER — AFLIBERCEPT 2MG/0.05ML IZ SOLN FOR KALEIDOSCOPE
2.0000 mg | INTRAVITREAL | Status: AC | PRN
  Administered 2020-03-15: 2 mg via INTRAVITREAL

## 2020-03-15 NOTE — Assessment & Plan Note (Signed)
Monocular patient with stable disciform scar adjacent and subfoveal.

## 2020-03-15 NOTE — Progress Notes (Signed)
03/15/2020     CHIEF COMPLAINT Patient presents for Retina Follow Up (10 Week Wet AMD f\u. Possible Eylea OD. OCT/Pt states vision is not as good as it used to be. Denies new FOL and floaters.)   HISTORY OF PRESENT ILLNESS: Allison Norman is a 87 y.o. female who presents to the clinic today for:   HPI    Retina Follow Up    Patient presents with  Wet AMD.  In right eye.  Severity is moderate.  Duration of 10 weeks.  Since onset it is stable.  I, the attending physician,  performed the HPI with the patient and updated documentation appropriately. Additional comments: 10 Week Wet AMD f\u. Possible Eylea OD. OCT Pt states vision is not as good as it used to be. Denies new FOL and floaters.       Last edited by Tilda Franco on 03/15/2020  2:07 PM. (History)      Referring physician: [name], MD [place],  [place] [address]  HISTORICAL INFORMATION:   Selected notes from the [facility]: No current outpatient medications on file. (Ophthalmic Drugs)   No current facility-administered medications for this visit. (Ophthalmic Drugs)   Current Outpatient Medications (Other)  Medication Sig  . amiodarone (PACERONE) 200 MG tablet Take 0.5 tablets (100 mg total) by mouth daily.  [name] amLODipine (NORVASC) 5 MG tablet Take 1 tablet (5 mg total) by mouth daily.  . Bevacizumab (AVASTIN IV) Place into the right eye every 6 (six) weeks.   . carvedilol (COREG) 3.125 MG tablet Take 1 tablet (3.125 mg total) by mouth 2 (two) times daily.  . cloNIDine (CATAPRES) 0.3 MG tablet Take 1 tablet (0.3 mg total) by mouth 3 (three) times daily.  . diazepam (VALIUM) 2 MG tablet Take 2 mg by mouth at bedtime as needed (for sleep).   [name] ELIQUIS 2.5 MG TABS tablet Take 1 tablet by mouth twice daily  . EQ Fiber Supplement 2 g CHEW Chew 2 tablets by mouth daily.  . furosemide (LASIX) 20 MG tablet Take 20 mg by mouth daily.  [name] losartan (COZAAR)  100 MG tablet Take 100 mg by mouth daily. Per PCP  . minoxidil (LONITEN) 2.5 MG tablet Take 2 tablets (5 mg total) by mouth 2 (two) times daily.  . Multiple Vitamins-Minerals (PRESERVISION AREDS 2) CAPS Take 1 capsule by mouth daily.   No current facility-administered medications for this visit. (Other)      REVIEW OF SYSTEMS:    ALLERGIES Allergies  Allergen Reactions  . Codeine Itching  . Penicillins Itching, Rash and Other (See Comments)    Has patient had a PCN reaction causing immediate rash, facial/tongue/throat swelling, SOB or lightheadedness with hypotension: Yes Has patient had a PCN reaction causing severe rash involving mucus membranes or skin necrosis: No Has patient had a PCN reaction that required hospitalization: No Has patient had a PCN reaction occurring within the last 10 years: No If all of the above answers are "NO", then may proceed with Cephalosporin use.  . Doxazosin     Heavy feeling in chest, congestion, wheezing  . Doxycycline Other (See Comments)    Cause chest tightness  . Doxycycline Hyclate Other (See Comments)  . Hydralazine     Felt bad and could hear pulse in head     PAST MEDICAL HISTORY Past Medical History:  Diagnosis Date  . Atrial fibrillation ([place]) 2017  a. s/p DCCV in 10/2015  b. recurrent in 08/2016 --> rate-control pursued.   . Cancer ([facility])    Breast  . CKD (chronic kidney disease)   . GCA (giant cell arteritis) ([facility])   . Hordeolum internum left lower eyelid 06/28/2019  . Hypertension   . Macular degeneration   . Osteoporosis   . PMR (polymyalgia rheumatica) (HCC)   . Resistant hypertension 03/15/2019  . Shortness of breath 03/15/2019  . Skin cancer    Past Surgical History:  Procedure Laterality Date  . CARDIOVERSION N/A 10/18/2015   Procedure: CARDIOVERSION;  Surgeon: Jerline Pain, MD;  Location: [facility];  Service: Cardiovascular;  Laterality: N/A;  . CARDIOVERSION N/A 02/20/2017   Procedure: CARDIOVERSION;  Surgeon:  Pixie Casino, MD;  Location: [facility] ENDOSCOPY;  Service: Cardiovascular;  Laterality: N/A;  . CARDIOVERSION N/A 06/18/2017   Procedure: CARDIOVERSION;  Surgeon: Sanda Klein, MD;  Location: [facility] ENDOSCOPY;  Service: Cardiovascular;  Laterality: N/A;  . CARDIOVERSION N/A 12/21/2018   Procedure: CARDIOVERSION;  Surgeon: Thayer Headings, MD;  Location: [facility] ENDOSCOPY;  Service: Cardiovascular;  Laterality: N/A;  . KNEE SURGERY  2013  . MASTECTOMY  1998   left side  . PACEMAKER IMPLANT N/A 07/22/2019   Procedure: PACEMAKER IMPLANT;  Surgeon: Evans Lance, MD;  Location: [facility] CV LAB;  Service: Cardiovascular;  Laterality: N/A;    FAMILY HISTORY Family History  Problem Relation Age of Onset  . Stroke Mother   . Kidney failure Father        died at 59  . Heart disease Sister        died at 12  . Heart disease Sister        died at 67  . Breast cancer Daughter     SOCIAL HISTORY Social History   Tobacco Use  . Smoking status: Former Smoker    Quit date: 08/31/1966    Years since quitting: 53.5  . Smokeless tobacco: Never Used  Vaping Use  . Vaping Use: Never used  Substance Use Topics  . Alcohol use: No  . Drug use: No         OPHTHALMIC EXAM: Base Eye Exam    Visual Acuity (Snellen - Linear)      Right Left   Dist cc 20/40 CF @ 2'   Dist ph cc NI NI       Tonometry (Tonopen, 2:11 PM)      Right Left   Pressure 12 13       Pupils      Pupils Dark Light Shape React APD   Right PERRL 4 3 Round Brisk None   Left PERRL 4 3 Round Brisk None       Visual Fields (Counting fingers)      Left Right    Full Full       Neuro/Psych    Oriented x3: Yes   Mood/Affect: Normal       Dilation    Both eyes: 1.0% Mydriacyl, 2.5% Phenylephrine @ 2:11 PM        Slit Lamp and Fundus Exam    External Exam      Right Left   External Normal Normal       Slit Lamp Exam      Right Left   Lids/Lashes Normal Normal   Conjunctiva/Sclera White and quiet White and  quiet   Cornea Clear Clear   Anterior Chamber Deep and quiet Deep and quiet   Iris Round  and reactive Round and reactive   Lens Posterior chamber intraocular lens, Open posterior capsule Posterior chamber intraocular lens, Open posterior capsule   Anterior Vitreous Normal Normal       Fundus Exam      Right Left   Posterior Vitreous Normal Normal   Disc Normal Normal   C/D Ratio 0.45 0.5   Macula Geographic atrophy, Advanced age related macular degeneration, Disciform scar nasal to faz, , Drusen, Retinal pigment epithelial mottling Geographic atrophy, Advanced age related macular degeneration, Disciform scar, Drusen, Retinal pigment epithelial mottling   Vessels Normal Normal   Periphery Normal Normal          IMAGING AND PROCEDURES  Imaging and Procedures for 03/15/20  OCT, Retina - OU - Both Eyes       Right Eye Quality was good. Scan locations included subfoveal. Central Foveal Thickness: 349. Progression has improved. Findings include intraretinal fluid, vitreomacular adhesion , pigment epithelial detachment, subretinal scarring, disciform scar.   Left Eye Quality was good. Scan locations included subfoveal. Central Foveal Thickness: 297. Progression has been stable. Findings include no SRF, retinal drusen , outer retinal atrophy, central retinal atrophy, subretinal hyper-reflective material, subretinal scarring.   Notes Pigment epithelial detachment with intraretinal fluid nasal to the fovea OD is stable,, at 10-week interval follow-up today  OS with advanced dry atrophic ARMD stable, no change in thickness OS, the computer selected lower baseline       Intravitreal Injection, Pharmacologic Agent - OD - Right Eye       Time Out 03/15/2020. 2:45 PM. Confirmed correct patient, procedure, site, and patient consented.   Anesthesia Topical anesthesia was used. Anesthetic medications included Akten 3.5%.   Procedure Preparation included Tobramycin 0.3%, 10% betadine  to eyelids, 5% betadine to ocular surface, Ofloxacin . A 30 gauge needle was used.   Injection:  2 mg aflibercept Alfonse Flavors) SOLN   NDC: [id number], Lot: [id number]   Route: Intravitreal, Site: Right Eye, Waste: 0 mg  Post-op Post injection exam found visual acuity of at least counting fingers. The patient tolerated the procedure well. There were no complications. The patient received written and verbal post procedure care education. Post injection medications were not given.                 ASSESSMENT/PLAN:  Advanced nonexudative age-related macular degeneration of left eye with subfoveal involvement Geographic atrophy limits acuity  Hordeolum internum left lower eyelid Edition resolved in the past  Exudative age-related macular degeneration of right eye with active choroidal neovascularization (HCC) Monocular patient with stable disciform scar adjacent and subfoveal.  Advanced nonexudative age-related macular degeneration of right eye without subfoveal involvement Atrophy not in the [place]Z but nearing it      ICD-10-CM   1. Exudative age-related macular degeneration of right eye with active choroidal neovascularization (HCC)  H35.3211 OCT, Retina - OU - Both Eyes    Intravitreal Injection, Pharmacologic Agent - OD - Right Eye    aflibercept (EYLEA) SOLN 2 mg  2. Advanced nonexudative age-related macular degeneration of left eye with subfoveal involvement  H35.3124   3. Hordeolum internum left lower eyelid  H00.025   4. Advanced nonexudative age-related macular degeneration of right eye without subfoveal involvement  H35.3113     1.  Wet ARMD with vascularized PED OD, stable at 10-week follow-up host Eylea injection, will continue to monitor and observe with aggressive maintenance therapy to prevent progression as small amounts of intraretinal fluid remain repeat Eylea today  2.  OS with geographic atrophy we will continue to monitor  3.  Ophthalmic Meds Ordered this  visit:  Meds ordered this encounter  Medications  . aflibercept (EYLEA) SOLN 2 mg       Return in about 10 weeks (around 05/24/2020) for dilate, OD, EYLEA OCT.  There are no Patient Instructions on file for this visit.   Explained the diagnoses, plan, and follow up with the patient and they expressed understanding.  Patient expressed understanding of the importance of proper follow up care.   Clent Demark Tobie Perdue M.D. Diseases & Surgery of the Retina and Vitreous Retina & Diabetic [place] 03/15/20     Abbreviations: M myopia (nearsighted); A astigmatism; H hyperopia (farsighted); P presbyopia; Mrx spectacle prescription;  CTL contact lenses; OD right eye; OS left eye; OU both eyes  XT exotropia; ET esotropia; PEK punctate epithelial keratitis; PEE punctate epithelial erosions; DES dry eye syndrome; MGD meibomian gland dysfunction; ATs artificial tears; PFAT's preservative free artificial tears; [place] nuclear sclerotic cataract; PSC posterior subcapsular cataract; ERM epi-retinal membrane; PVD posterior vitreous detachment; RD retinal detachment; DM diabetes mellitus; DR diabetic retinopathy; NPDR non-proliferative diabetic retinopathy; PDR proliferative diabetic retinopathy; CSME clinically significant macular edema; DME diabetic macular edema; dbh dot blot hemorrhages; CWS cotton wool spot; POAG primary open angle glaucoma; C/D cup-to-disc ratio; HVF humphrey visual field; GVF goldmann visual field; OCT optical coherence tomography; IOP intraocular pressure; BRVO Branch retinal vein occlusion; CRVO central retinal vein occlusion; CRAO central retinal artery occlusion; BRAO branch retinal artery occlusion; RT retinal tear; SB scleral buckle; PPV pars plana vitrectomy; VH Vitreous hemorrhage; PRP panretinal laser photocoagulation; IVK intravitreal kenalog; VMT vitreomacular traction; MH Macular hole;  NVD neovascularization of the disc; NVE neovascularization elsewhere; AREDS age related eye disease  study; ARMD age related macular degeneration; POAG primary open angle glaucoma; EBMD epithelial/anterior basement membrane dystrophy; ACIOL anterior chamber intraocular lens; IOL intraocular lens; PCIOL posterior chamber intraocular lens; Phaco/IOL phacoemulsification with intraocular lens placement; [place] photorefractive keratectomy; LASIK laser assisted in situ keratomileusis; HTN hypertension; DM diabetes mellitus; COPD chronic obstructive pulmonary disease

## 2020-03-15 NOTE — Assessment & Plan Note (Signed)
Geographic atrophy limits acuity

## 2020-03-15 NOTE — Assessment & Plan Note (Signed)
Atrophy not in the [place] but nearing it

## 2020-03-15 NOTE — Assessment & Plan Note (Signed)
Edition resolved in the past

## 2020-03-20 ENCOUNTER — Other Ambulatory Visit: Payer: Self-pay

## 2020-03-20 ENCOUNTER — Ambulatory Visit (INDEPENDENT_AMBULATORY_CARE_PROVIDER_SITE_OTHER): Payer: Medicare Other | Admitting: Pharmacist

## 2020-03-20 VITALS — BP 148/54 | HR 61 | Resp 16 | Ht 65.5 in | Wt 143.0 lb

## 2020-03-20 DIAGNOSIS — I10 Essential (primary) hypertension: Secondary | ICD-10-CM | POA: Diagnosis not present

## 2020-03-20 MED ORDER — MINOXIDIL 2.5 MG PO TABS: ORAL_TABLET | ORAL | 3 refills | Status: DC

## 2020-03-20 MED ORDER — CARVEDILOL 3.125 MG PO TABS: 3.1250 mg | ORAL_TABLET | Freq: Two times a day (BID) | ORAL | 3 refills | Status: DC

## 2020-03-20 MED ORDER — AMLODIPINE BESYLATE 5 MG PO TABS: 5.0000 mg | ORAL_TABLET | Freq: Every evening | ORAL | 3 refills | Status: DC

## 2020-03-20 NOTE — Patient Instructions (Addendum)
Return for a  follow up appointment in 4 weeks  Check your blood pressure at home daily (if able) and keep record of the readings.  Take your BP meds as follows:  *Morning* Carvedilol 3.125mg  daily Losartan 100mg  daily Clonidine 0.3mg   Minoxidil 5mg   *Lunch* Clonidine 0.3mg    *Supper* Carvedilol 3.125mg  evening Amlodipine 5mg  Clonidine 0.3mg  minoxil 2.5mg    Bring all of your meds, your BP cuff and your record of home blood pressures to your next appointment.  Exercise as you're able, try to walk approximately 30 minutes per day.  Keep salt intake to a minimum, especially watch canned and prepared boxed foods.  Eat more fresh fruits and vegetables and fewer canned items.  Avoid eating in fast food restaurants.    HOW TO TAKE YOUR BLOOD PRESSURE: . Rest 5 minutes before taking your blood pressure. .  Don't smoke or drink caffeinated beverages for at least 30 minutes before. . Take your blood pressure before (not after) you eat. . Sit comfortably with your back supported and both feet on the floor (don't cross your legs). . Elevate your arm to heart level on a table or a desk. . Use the proper sized cuff. It should fit smoothly and snugly around your bare upper arm. There should be enough room to slip a fingertip under the cuff. The bottom edge of the cuff should be 1 inch above the crease of the elbow. . Ideally, take 3 measurements at one sitting and record the average.

## 2020-03-20 NOTE — Progress Notes (Signed)
Patient ID: Allison Norman                 DOB: 02-Apr-1924                      MRN: [id number]     HPI: Allison Norman is a 87 y.o. female referred by Dr. Debara Pickett to HTN clinic.  PMH includes atrial fibrillation, resistance hypertension, and CKD stage IV. Noted intolerance to doxazosin,hydralazine, and high dose amlodipine.   Patient presents accompany by her daughter. She noted elevated home BP usually in the afternoon. Reports compliance with all therapy, and denies problems with current medication, but will like to take less medication. Also reports increase fatigue, but denies dizziness, lightheadedness, or falls.  Current HTN meds:  Amlodipine 5mg  - 1pm Carvedilol 3.125mg  BID - 11am & 9pm Clonidine 0.3mg  TID - 8am & 5pm & 9pm Losartan 100mg  daily - 11am monixidil 5mg  BID 10am & 5pm  Previously tried:  Amlodipine 10mg  - LEE Doxazosin Hydralazine Diltiazem  BP goal: <140/90  Family History: stroke in mother, kidney failure in father, heart disease in sister x 2,  Breast cancer in daughter  Social History: former smoker, denies alcohol use  Diet: balanced, no special diet  Exercise: activities of daily living  Home BP readings:  2 morning readings (109/59 and 113/55) 7 evening readings, average 143/64  Wt Readings from Last 3 Encounters:  03/20/20 143 lb (64.9 kg)  02/17/20 139 lb 6.4 oz (63.2 kg)  01/11/20 141 lb 6.4 oz (64.1 kg)   BP Readings from Last 3 Encounters:  03/20/20 (!) 148/54  02/17/20 (!) 158/72  01/11/20 (!) 190/78   Pulse Readings from Last 3 Encounters:  03/20/20 61  02/17/20 65  01/11/20 60    Past Medical History:  Diagnosis Date  . Atrial fibrillation ([place]) 2017   a. s/p DCCV in 10/2015  b. recurrent in 08/2016 --> rate-control pursued.   . Cancer ([place])    Breast  . CKD (chronic kidney disease)   . GCA (giant cell arteritis) ([place])   . Hordeolum internum left lower eyelid 06/28/2019  . Hypertension   . Macular degeneration   .  Osteoporosis   . PMR (polymyalgia rheumatica) (HCC)   . Resistant hypertension 03/15/2019  . Shortness of breath 03/15/2019  . Skin cancer     Current Outpatient Medications on File Prior to Visit  Medication Sig Dispense Refill  . amiodarone (PACERONE) 200 MG tablet Take 0.5 tablets (100 mg total) by mouth daily. 45 tablet 2  . Bevacizumab (AVASTIN IV) Place into the right eye every 6 (six) weeks.     . cloNIDine (CATAPRES) 0.3 MG tablet Take 1 tablet (0.3 mg total) by mouth 3 (three) times daily. 180 tablet 1  . diazepam (VALIUM) 2 MG tablet Take 2 mg by mouth at bedtime as needed (for sleep).   0  . ELIQUIS 2.5 MG TABS tablet Take 1 tablet by mouth twice daily 180 tablet 0  . EQ Fiber Supplement 2 g CHEW Chew 2 tablets by mouth daily.    . furosemide (LASIX) 20 MG tablet Take 20 mg by mouth daily.    [name] losartan (COZAAR) 100 MG tablet Take 100 mg by mouth in the morning. Per PCP     . Multiple Vitamins-Minerals (PRESERVISION AREDS 2) CAPS Take 1 capsule by mouth daily.     No current facility-administered medications on file prior to visit.    Allergies  Allergen Reactions  .  Codeine Itching  . Penicillins Itching, Rash and Other (See Comments)    Has patient had a PCN reaction causing immediate rash, facial/tongue/throat swelling, SOB or lightheadedness with hypotension: Yes Has patient had a PCN reaction causing severe rash involving mucus membranes or skin necrosis: No Has patient had a PCN reaction that required hospitalization: No Has patient had a PCN reaction occurring within the last 10 years: No If all of the above answers are "NO", then may proceed with Cephalosporin use.  . Doxazosin     Heavy feeling in chest, congestion, wheezing  . Doxycycline Other (See Comments)    Cause chest tightness  . Doxycycline Hyclate Other (See Comments)  . Hydralazine     Felt bad and could hear pulse in head     Blood pressure (!) 148/54, pulse 61, resp. rate 16, height 5' 5.5" (1.664  m), weight 143 lb (64.9 kg), SpO2 92 %.  Essential hypertension BP slightly above goal, but significantly improved since last OV. Noted patient is taking her BP medication throughout the day with no particular reason to separate medication doses. Will arrange medication on morning and evening dosing with only TID medication taken mid-day. Plan to follow up in 4 weeks and adjust doses if needed once pattern of BP response to short acting medication established.    Amani Nodarse Rodriguez-Guzman PharmD, BCPS, [place] [address] [place], [address] 03/30/2020 4:18 PM

## 2020-03-29 ENCOUNTER — Other Ambulatory Visit: Payer: Self-pay | Admitting: Cardiovascular Disease

## 2020-03-30 ENCOUNTER — Encounter: Payer: Self-pay | Admitting: Pharmacist

## 2020-03-30 NOTE — Assessment & Plan Note (Signed)
BP slightly above goal, but significantly improved since last OV. Noted patient is taking her BP medication throughout the day with no particular reason to separate medication doses. Will arrange medication on morning and evening dosing with only TID medication taken mid-day. Plan to follow up in 4 weeks and adjust doses if needed once pattern of BP response to short acting medication established.

## 2020-04-05 DIAGNOSIS — E781 Pure hyperglyceridemia: Secondary | ICD-10-CM | POA: Diagnosis not present

## 2020-04-05 DIAGNOSIS — G4700 Insomnia, unspecified: Secondary | ICD-10-CM | POA: Diagnosis not present

## 2020-04-05 DIAGNOSIS — N183 Chronic kidney disease, stage 3 unspecified: Secondary | ICD-10-CM | POA: Diagnosis not present

## 2020-04-05 DIAGNOSIS — M179 Osteoarthritis of knee, unspecified: Secondary | ICD-10-CM | POA: Diagnosis not present

## 2020-04-05 DIAGNOSIS — Z853 Personal history of malignant neoplasm of breast: Secondary | ICD-10-CM | POA: Diagnosis not present

## 2020-04-05 DIAGNOSIS — D649 Anemia, unspecified: Secondary | ICD-10-CM | POA: Diagnosis not present

## 2020-04-05 DIAGNOSIS — I10 Essential (primary) hypertension: Secondary | ICD-10-CM | POA: Diagnosis not present

## 2020-04-05 DIAGNOSIS — I4891 Unspecified atrial fibrillation: Secondary | ICD-10-CM | POA: Diagnosis not present

## 2020-04-05 DIAGNOSIS — N184 Chronic kidney disease, stage 4 (severe): Secondary | ICD-10-CM | POA: Diagnosis not present

## 2020-04-05 DIAGNOSIS — K219 Gastro-esophageal reflux disease without esophagitis: Secondary | ICD-10-CM | POA: Diagnosis not present

## 2020-04-09 DIAGNOSIS — N184 Chronic kidney disease, stage 4 (severe): Secondary | ICD-10-CM | POA: Diagnosis not present

## 2020-04-09 DIAGNOSIS — I10 Essential (primary) hypertension: Secondary | ICD-10-CM | POA: Diagnosis not present

## 2020-04-09 DIAGNOSIS — I4891 Unspecified atrial fibrillation: Secondary | ICD-10-CM | POA: Diagnosis not present

## 2020-04-09 DIAGNOSIS — E781 Pure hyperglyceridemia: Secondary | ICD-10-CM | POA: Diagnosis not present

## 2020-04-09 DIAGNOSIS — D649 Anemia, unspecified: Secondary | ICD-10-CM | POA: Diagnosis not present

## 2020-04-09 DIAGNOSIS — K219 Gastro-esophageal reflux disease without esophagitis: Secondary | ICD-10-CM | POA: Diagnosis not present

## 2020-04-09 DIAGNOSIS — G4700 Insomnia, unspecified: Secondary | ICD-10-CM | POA: Diagnosis not present

## 2020-04-09 DIAGNOSIS — M179 Osteoarthritis of knee, unspecified: Secondary | ICD-10-CM | POA: Diagnosis not present

## 2020-04-17 ENCOUNTER — Other Ambulatory Visit: Payer: Self-pay

## 2020-04-17 ENCOUNTER — Ambulatory Visit (INDEPENDENT_AMBULATORY_CARE_PROVIDER_SITE_OTHER): Payer: Medicare Other | Admitting: Pharmacist

## 2020-04-17 VITALS — BP 162/68 | HR 62 | Resp 14 | Ht 65.5 in | Wt 142.2 lb

## 2020-04-17 DIAGNOSIS — I10 Essential (primary) hypertension: Secondary | ICD-10-CM

## 2020-04-17 MED ORDER — LOSARTAN POTASSIUM 100 MG PO TABS: 100.0000 mg | ORAL_TABLET | Freq: Every day | ORAL | 1 refills | Status: DC

## 2020-04-17 NOTE — Progress Notes (Signed)
Patient ID: Allison Norman                 DOB: 07/16/1924                      MRN: [id number]     HPI: Allison Norman is a 87 y.o. female referred by Dr. Debara Pickett to HTN clinic.  PMH includes atrial fibrillation, resistance hypertension, and CKD stage IV. Noted intolerance to doxazosin,hydralazine, and high dose amlodipine.   Patient presents accompany by her daughter. Reports compliance with all therapy, and denies problems with current medication, but will like to take less medication. Also reports improvement in fatigue, but still taking mid-day clonidine to close to evening dose. Denies dizziness, lightheadedness, or falls.  Current HTN meds:   *Morning* (9:30-10am) Carvedilol 3.125mg  daily Losartan 100mg  daily Clonidine 0.3mg   Minoxidil 5mg   *Lunch* (1-2pm) Clonidine 0.3mg    *Supper* (7pm) Carvedilol 3.125mg  evening Amlodipine 5mg  Clonidine 0.3mg  minoxil 2.5mg    Previously tried:  Amlodipine 10mg  - LEE Doxazosin Hydralazine Diltiazem  BP goal: <140/90  Family History: stroke in mother, kidney failure in father, heart disease in sister x 2, Breast cancer in daughter  Social History: former smoker, denies alcohol use  Diet: balanced, no special diet  Exercise: activities of daily living  Home BP readings:  13 morning readings, average 134/62, HR 60bpm 12 evening readings, average 138/62, HR 60-63bpm  Wt Readings from Last 3 Encounters:  04/17/20 142 lb 3.2 oz (64.5 kg)  03/20/20 143 lb (64.9 kg)  02/17/20 139 lb 6.4 oz (63.2 kg)   BP Readings from Last 3 Encounters:  04/17/20 (!) 162/68  03/20/20 (!) 148/54  02/17/20 (!) 158/72   Pulse Readings from Last 3 Encounters:  04/17/20 62  03/20/20 61  02/17/20 65    Past Medical History:  Diagnosis Date  . Atrial fibrillation ([place]) 2017   a. s/p DCCV in 10/2015  b. recurrent in 08/2016 --> rate-control pursued.   . Cancer ([place])    Breast  . CKD (chronic kidney disease)   . GCA (giant cell arteritis)  ([place])   . Hordeolum internum left lower eyelid 06/28/2019  . Hypertension   . Macular degeneration   . Osteoporosis   . PMR (polymyalgia rheumatica) (HCC)   . Resistant hypertension 03/15/2019  . Shortness of breath 03/15/2019  . Skin cancer     Current Outpatient Medications on File Prior to Visit  Medication Sig Dispense Refill  . amiodarone (PACERONE) 200 MG tablet Take 0.5 tablets (100 mg total) by mouth daily. 45 tablet 2  . amLODipine (NORVASC) 5 MG tablet Take 1 tablet by mouth once daily 90 tablet 3  . Bevacizumab (AVASTIN IV) Place into the right eye every 6 (six) weeks.     . carvedilol (COREG) 3.125 MG tablet Take 1 tablet (3.125 mg total) by mouth 2 (two) times daily with a meal. 180 tablet 3  . cloNIDine (CATAPRES) 0.3 MG tablet Take 1 tablet (0.3 mg total) by mouth 3 (three) times daily. 180 tablet 1  . diazepam (VALIUM) 2 MG tablet Take 2 mg by mouth at bedtime as needed (for sleep).   0  . ELIQUIS 2.5 MG TABS tablet Take 1 tablet by mouth twice daily 180 tablet 0  . EQ Fiber Supplement 2 g CHEW Chew 2 tablets by mouth daily.    . furosemide (LASIX) 20 MG tablet Take 20 mg by mouth daily.    . minoxidil (LONITEN) 2.5 MG tablet  Take 2 tablets (5 mg total) by mouth in the morning AND 1 tablet (2.5 mg total) every evening. 365 tablet 3  . Multiple Vitamins-Minerals (PRESERVISION AREDS 2) CAPS Take 1 capsule by mouth daily.     No current facility-administered medications on file prior to visit.    Allergies  Allergen Reactions  . Codeine Itching  . Penicillins Itching, Rash and Other (See Comments)    Has patient had a PCN reaction causing immediate rash, facial/tongue/throat swelling, SOB or lightheadedness with hypotension: Yes Has patient had a PCN reaction causing severe rash involving mucus membranes or skin necrosis: No Has patient had a PCN reaction that required hospitalization: No Has patient had a PCN reaction occurring within the last 10 years: No If all of the  above answers are "NO", then may proceed with Cephalosporin use.  . Doxazosin     Heavy feeling in chest, congestion, wheezing  . Doxycycline Other (See Comments)    Cause chest tightness  . Doxycycline Hyclate Other (See Comments)  . Hydralazine     Felt bad and could hear pulse in head     Blood pressure (!) 162/68, pulse 62, resp. rate 14, height 5' 5.5" (1.664 m), weight 142 lb 3.2 oz (64.5 kg), SpO2 94 %.  Essential hypertension Blood pressure highly variable, with well controlled morning readings, but elevated BP most afternoon. Noted patient takes mid-day clonidine and evening clonidine very close together.   Will change mid-day clonidine 0.3mg  at 1 or 2 pm, continue to monitor and follow up in 8 weeks. Plan to change losartan to valsartan 160mg  daily during next OV and wean off clonidine if blood pressure become more stable.   Darlin Stenseth Rodriguez-Guzman PharmD, BCPS, [place] [address] 04/26/2020 12:53 PM

## 2020-04-17 NOTE — Patient Instructions (Addendum)
Return for a  follow up appointment in 8 weeks  Check your blood pressure at home daily (if able) and keep record of the readings.  Take your BP meds as follows: *Take mid-day clonidine 0.3mg  at 1 or 2 pm, not later*  Bring all of your meds, your BP cuff and your record of home blood pressures to your next appointment.  Exercise as you're able, try to walk approximately 30 minutes per day.  Keep salt intake to a minimum, especially watch canned and prepared boxed foods.  Eat more fresh fruits and vegetables and fewer canned items.  Avoid eating in fast food restaurants.    HOW TO TAKE YOUR BLOOD PRESSURE: . Rest 5 minutes before taking your blood pressure. .  Don't smoke or drink caffeinated beverages for at least 30 minutes before. . Take your blood pressure before (not after) you eat. . Sit comfortably with your back supported and both feet on the floor (don't cross your legs). . Elevate your arm to heart level on a table or a desk. . Use the proper sized cuff. It should fit smoothly and snugly around your bare upper arm. There should be enough room to slip a fingertip under the cuff. The bottom edge of the cuff should be 1 inch above the crease of the elbow. . Ideally, take 3 measurements at one sitting and record the average.

## 2020-04-20 ENCOUNTER — Ambulatory Visit (INDEPENDENT_AMBULATORY_CARE_PROVIDER_SITE_OTHER): Payer: Medicare Other

## 2020-04-20 DIAGNOSIS — I495 Sick sinus syndrome: Secondary | ICD-10-CM | POA: Diagnosis not present

## 2020-04-24 LAB — CUP PACEART REMOTE DEVICE CHECK
Battery Remaining Longevity: 102 mo
Battery Remaining Percentage: 100 %
Brady Statistic RA Percent Paced: 82 %
Brady Statistic RV Percent Paced: 1 %
Date Time Interrogation Session: 20220415022200
Implantable Lead Implant Date: 20210716
Implantable Lead Implant Date: 20210716
Implantable Lead Location: 753859
Implantable Lead Location: 753860
Implantable Lead Model: 7840
Implantable Lead Model: 7841
Implantable Lead Serial Number: 1017229
Implantable Lead Serial Number: 1090224
Implantable Pulse Generator Implant Date: 20210716
Lead Channel Impedance Value: 599 Ohm
Lead Channel Impedance Value: 774 Ohm
Lead Channel Pacing Threshold Amplitude: 0.6 V
Lead Channel Pacing Threshold Amplitude: 1 V
Lead Channel Pacing Threshold Pulse Width: 0.4 ms
Lead Channel Pacing Threshold Pulse Width: 0.4 ms
Lead Channel Setting Pacing Amplitude: 2 V
Lead Channel Setting Pacing Amplitude: 2.5 V
Lead Channel Setting Pacing Pulse Width: 0.4 ms
Lead Channel Setting Sensing Sensitivity: 2.5 mV
Pulse Gen Serial Number: 547553

## 2020-04-26 ENCOUNTER — Encounter (HOSPITAL_COMMUNITY): Payer: Self-pay | Admitting: Nurse Practitioner

## 2020-04-26 ENCOUNTER — Other Ambulatory Visit: Payer: Self-pay

## 2020-04-26 ENCOUNTER — Ambulatory Visit (HOSPITAL_COMMUNITY)
Admission: RE | Admit: 2020-04-26 | Discharge: 2020-04-26 | Disposition: A | Discharge status: Home / Self Care | Payer: Medicare Other | Source: Ambulatory Visit | Attending: Nurse Practitioner | Admitting: Nurse Practitioner

## 2020-04-26 ENCOUNTER — Encounter: Payer: Self-pay | Admitting: Pharmacist

## 2020-04-26 VITALS — BP 160/70 | HR 60 | Ht 65.5 in | Wt 141.2 lb

## 2020-04-26 DIAGNOSIS — Z8249 Family history of ischemic heart disease and other diseases of the circulatory system: Secondary | ICD-10-CM | POA: Diagnosis not present

## 2020-04-26 DIAGNOSIS — Z7901 Long term (current) use of anticoagulants: Secondary | ICD-10-CM | POA: Diagnosis not present

## 2020-04-26 DIAGNOSIS — I4892 Unspecified atrial flutter: Secondary | ICD-10-CM | POA: Insufficient documentation

## 2020-04-26 DIAGNOSIS — I480 Paroxysmal atrial fibrillation: Secondary | ICD-10-CM

## 2020-04-26 DIAGNOSIS — I10 Essential (primary) hypertension: Secondary | ICD-10-CM | POA: Diagnosis not present

## 2020-04-26 DIAGNOSIS — Z87891 Personal history of nicotine dependence: Secondary | ICD-10-CM | POA: Insufficient documentation

## 2020-04-26 DIAGNOSIS — I4819 Other persistent atrial fibrillation: Secondary | ICD-10-CM | POA: Diagnosis not present

## 2020-04-26 DIAGNOSIS — Z79899 Other long term (current) drug therapy: Secondary | ICD-10-CM | POA: Diagnosis not present

## 2020-04-26 DIAGNOSIS — Z880 Allergy status to penicillin: Secondary | ICD-10-CM | POA: Insufficient documentation

## 2020-04-26 DIAGNOSIS — Z881 Allergy status to other antibiotic agents status: Secondary | ICD-10-CM | POA: Diagnosis not present

## 2020-04-26 DIAGNOSIS — Z885 Allergy status to narcotic agent status: Secondary | ICD-10-CM | POA: Insufficient documentation

## 2020-04-26 DIAGNOSIS — D6869 Other thrombophilia: Secondary | ICD-10-CM | POA: Diagnosis not present

## 2020-04-26 NOTE — Assessment & Plan Note (Signed)
Blood pressure highly variable, with well controlled morning readings, but elevated BP most afternoon. Noted patient takes mid-day clonidine and evening clonidine very close together.   Will change mid-day clonidine 0.3mg  at 1 or 2 pm, continue to monitor and follow up in 8 weeks. Plan to change losartan to valsartan 160mg  daily during next OV and wean off clonidine if blood pressure become more stable.

## 2020-04-26 NOTE — Progress Notes (Signed)
Primary Care Physician: [name], MD Referring Physician: [name] is a 87 y.o. female, appearing younger than stated age,with a h/o paroxysmal afib, dx fall of 2017, with successful cardioversion, but has had increase in  afib burden  since August. She was in [place] in November when seen by Dr. Debara Pickett. She is in the afib clinic to discuss options to treat.. Pt states that she has been under a lot of stress due to her 73 yo husband with progressive dementia and Hospice is now in the home. She feel that she has been in persistent afib since December.  She is busy during the day and does not notice her afib that much. She does notice it for the first several minutes trying to get to sleep. Does not notice any undo shortness of breath with exertion or fatigue. She is on apixaban correctly dosed at 2.5 mg bid for a chadsvasc score of 4. She noted increase in HR around 1/9 and was advised to go to the ER, waited 6 hours and had not been seen so left. She feels this elevated HR was due to stress over husband.  When seen in the afib clinic one week ago, we discussed options to manage her afib. She does not sound that symptomatic. BB was increased and she is for f/u. Her BP has been stable on higher dose of BB. HR's for the most part have her reasonably rate controlled. Offered cardioversion or amiodarone but she defers at this time.  F/u in afib clinic, she is now on cardizem 240 mg a day, dose increased last week when she felt her HR was still running too high. HR in office  now in the 70's. She has thought about options and would like to try a cardioversion. Last one held her almost a year. No missed does of eliquis.  F/u in afib clinic, she had successful cardioversion and continues in SR. She did have one short lived episode of  4:1 atrial flutter after DCCV but then went back into SR. She did not get her usual energy back right after cardioversion this time but is feeling some  better.   F/i in afib clinic, 04/15/17, unfortunately, pt has returned to afib for the last few weeks and has noted fluctuating HR/ BP readings. At home yesterday reported higher BP readings, in the office today at 156/84. It appears that the pt is getting more symptomatic with her afib and feels fatigued. I discussed with her starting amiodarone to pursue SR and she is in agreement.   F/u in fib clinic, 04/21/17 ,after starting on amiodarone 200 mg bid. She called the office yesterday with a HR in the upper 40's. BB dose was reduced to 1/2 tab daily. Today, she feels better and EKG shows SR with pac's in the 60's.  F/u in afib clinic, she feels improved but still has some slow heart rates but is not symptomatic. She is staying in [place]. Will reduce amiodarone to 200 mg once a day.  F/u in afib clinic, 05/25/17, she called in and was reporting some low heart rates so my nurse advised to cut BB in half.  Today, she is in aflutter with controlled v rate. Her husband died this week and from the stress may have caused her to get out of rhythm vrs, she also forgot her amiodarone for 4 days.  F/u in afib clinic, pt continues to be in afib despite increasing amiodarone to 200  mg bid. The recent death of her husband has caused a lot of stress which is probably contributing to persistence of afib Discussed cardioversion and pt is in agreement. No missed doses of eliquis.  F/u in afib clinic following cardioversion which was successful and she continues in Sinus brady in the 40's and will lower rate control. She was suppose to come in last week but had a mechanical fall  with a significant abrasion to her knee and is seeing her PCP for dressings.   F/u in afib clinic 12/15/18.She was recently seen by [name], PA, and was found to be back in Afib. She feels that she may  have been out for 2-3 weeks. She is also c/o LLE edema, Rt greater than Left for the last few weeks as well. Twisted the rt ankle last week. She  has been off lasix  for a while for issues with kidney function climbing but she feels her LLE needs some diuretic . She had cardioversion around a year ago and held till recently. She continues on amiodarone 200 mg qd.[name] increased her BB. Ekg shows afib with RVR at 106 bpm.CHA2DS2VASc score of at least 4, continues on eliquis 2.5 mg bid .  F/u with afib clinic s/p cardioversion, 12/22.  It was successful and she continues in SR. I put her back on lasix 20 mg every other day, which she took up til cardioversion but then stopped as she was not sure to take it. She is very concerned re her LLE, which has progressively gotten worse sine lasix was stopped in the fall 2/2 rising creatinine. She  felt that she had some throat congestion for a couple nights after the cardioversion. She really does not  feel better being in rhythm.   F/u in afib clinic, 05/30/19. She asked to be seen today as she has noted more swings in her heart rate from the 50's to 100's. She is in SR today. She  has been tired on several BP meds but has not tolerated recent hydralazine or minoxidil. She stopped minoxidil because of heart racing. BP is stable today.she takes lasix 20 mg as needed. She did awake last week with shortness of breath and heaviness in her chest. She  took 40 mg lasix and the sensation resolved within a few hours and she has been sleeping well since then.  F/u in afib clinic, 04/26/20. She was referred by Raquel, PharmD, as pt reported that often she will note pulse ox at 88 at night and was wondering if she could come off her amiodarone 100 mg daily. She has been enjoying SR and has not reported any afib. She denies any shortness of breath and no cough. Her last chest xray did not show any interstitial  changes. She does not feel short of breath when she checks her Pulse Ox at night. Often times she will sit on her hands until nice and warm and sees improvement in the readings. Otherwise, no complaints.   Today, she  denies symptoms of palpitations, chest pain, shortness of breath, orthopnea, PND, lower extremity edema, dizziness, presyncope, syncope, or neurologic sequela.+ fatigue. The patient is tolerating medications without difficulties and is otherwise without complaint today.   Past Medical History:  Diagnosis Date  . Atrial fibrillation ([place]) 2017   a. s/p DCCV in 10/2015  b. recurrent in 08/2016 --> rate-control pursued.   . Cancer ([place])    Breast  . CKD (chronic kidney disease)   . GCA (giant  cell arteritis) ([place])   . Hordeolum internum left lower eyelid 06/28/2019  . Hypertension   . Macular degeneration   . Osteoporosis   . PMR (polymyalgia rheumatica) ([facility])   . Resistant hypertension 03/15/2019  . Shortness of breath 03/15/2019  . Skin cancer    Past Surgical History:  Procedure Laterality Date  . CARDIOVERSION N/A 10/18/2015   Procedure: CARDIOVERSION;  Surgeon: Jerline Pain, MD;  Location: [facility];  Service: Cardiovascular;  Laterality: N/A;  . CARDIOVERSION N/A 02/20/2017   Procedure: CARDIOVERSION;  Surgeon: Pixie Casino, MD;  [facility] ENDOSCOPY;  Service: Cardiovascular;  Laterality: N/A;  . CARDIOVERSION N/A 06/18/2017   Procedure: CARDIOVERSION;  Surgeon: Sanda Klein, MD;  Location: [facility] ENDOSCOPY;  Service: Cardiovascular;  Laterality: N/A;  . CARDIOVERSION N/A 12/21/2018   Procedure: CARDIOVERSION;  Surgeon: Thayer Headings, MD;  Location: [facility] ENDOSCOPY;  Service: Cardiovascular;  Laterality: N/A;  . KNEE SURGERY  2013  . MASTECTOMY  1998   left side  . PACEMAKER IMPLANT N/A 07/22/2019   Procedure: PACEMAKER IMPLANT;  Surgeon: Evans Lance, MD;  Location: [facility] CV LAB;  Service: Cardiovascular;  Laterality: N/A;    Current Outpatient Medications  Medication Sig Dispense Refill  . amiodarone (PACERONE) 200 MG tablet Take 0.5 tablets (100 mg total) by mouth daily. 45 tablet 2  . amLODipine (NORVASC) 5 MG tablet Take 1 tablet by mouth once daily 90 tablet  3  . Bevacizumab (AVASTIN IV) Place into the right eye every 6 (six) weeks.     . carvedilol (COREG) 3.125 MG tablet Take 1 tablet (3.125 mg total) by mouth 2 (two) times daily with a meal. 180 tablet 3  . cloNIDine (CATAPRES) 0.3 MG tablet Take 1 tablet (0.3 mg total) by mouth 3 (three) times daily. 180 tablet 1  . diazepam (VALIUM) 2 MG tablet Take 2 mg by mouth at bedtime as needed (for sleep).   0  . ELIQUIS 2.5 MG TABS tablet Take 1 tablet by mouth twice daily 180 tablet 0  . EQ Fiber Supplement 2 g CHEW Chew 2 tablets by mouth daily.    . furosemide (LASIX) 20 MG tablet Take 20 mg by mouth daily.    [name] losartan (COZAAR) 100 MG tablet Take 1 tablet (100 mg total) by mouth daily. Per PCP 90 tablet 1  . minoxidil (LONITEN) 2.5 MG tablet Take 2 tablets (5 mg total) by mouth in the morning AND 1 tablet (2.5 mg total) every evening. 365 tablet 3  . Multiple Vitamins-Minerals (PRESERVISION AREDS 2) CAPS Take 1 capsule by mouth daily.     No current facility-administered medications for this encounter.    Allergies  Allergen Reactions  . Codeine Itching  . Penicillins Itching, Rash and Other (See Comments)    Has patient had a PCN reaction causing immediate rash, facial/tongue/throat swelling, SOB or lightheadedness with hypotension: Yes Has patient had a PCN reaction causing severe rash involving mucus membranes or skin necrosis: No Has patient had a PCN reaction that required hospitalization: No Has patient had a PCN reaction occurring within the last 10 years: No If all of the above answers are "NO", then may proceed with Cephalosporin use.  . Doxazosin     Heavy feeling in chest, congestion, wheezing  . Doxycycline Other (See Comments)    Cause chest tightness  . Doxycycline Hyclate Other (See Comments)  . Hydralazine     Felt bad and could hear pulse in head  Social History   Socioeconomic History  . Marital status: Married    Spouse name: Not on file  . Number of children:  2  . Years of education: Not on file  . Highest education level: Not on file  Occupational History  . Not on file  Tobacco Use  . Smoking status: Former Smoker    Quit date: 08/31/1966    Years since quitting: 53.6  . Smokeless tobacco: Never Used  Vaping Use  . Vaping Use: Never used  Substance and Sexual Activity  . Alcohol use: No  . Drug use: No  . Sexual activity: Not on file  Other Topics Concern  . Not on file  Social History Narrative   epworth sleepiness scale = 8 (08/31/15)   Social Determinants of Health   Financial Resource Strain: Not on file  Food Insecurity: Not on file  Transportation Needs: Not on file  Physical Activity: Not on file  Stress: Not on file  Social Connections: Not on file  Intimate Partner Violence: Not on file    Family History  Problem Relation Age of Onset  . Stroke Mother   . Kidney failure Father        died at 74  . Heart disease Sister        died at 15  . Heart disease Sister        died at 22  . Breast cancer Daughter     ROS- All systems are reviewed and negative except as per the HPI above  Physical Exam: Vitals:   04/26/20 1509  BP: (!) 160/70  Pulse: 60  SpO2: 95%  Weight: 64 kg  Height: 5' 5.5" (1.664 m)   Wt Readings from Last 3 Encounters:  04/26/20 64 kg  04/17/20 64.5 kg  03/20/20 64.9 kg    Labs: Lab Results  Component Value Date   NA 143 02/17/2020   K 5.0 02/17/2020   CL 101 02/17/2020   CO2 21 02/17/2020   GLUCOSE 92 02/17/2020   BUN 32 02/17/2020   CREATININE 1.79 (H) 02/17/2020   CALCIUM 9.5 02/17/2020   MG 2.2 08/18/2016   Lab Results  Component Value Date   INR 1.1 10/11/2015   Lab Results  Component Value Date   CHOL  07/04/2009    81        ATP III CLASSIFICATION:  <200     mg/dL   Desirable  200-239  mg/dL   Borderline High  >=240    mg/dL   High          HDL 18 (L) 07/04/2009   LDLCALC  07/04/2009    50        Total Cholesterol/HDL:CHD Risk Coronary Heart Disease Risk  Table                     Men   Women  1/2 Average Risk   3.4   3.3  Average Risk       5.0   4.4  2 X Average Risk   9.6   7.1  3 X Average Risk  23.4   11.0        Use the calculated Patient Ratio above and the CHD Risk Table to determine the patient's CHD Risk.        ATP III CLASSIFICATION (LDL):  <100     mg/dL   Optimal  100-129  mg/dL   Near or Above  Optimal  130-159  mg/dL   Borderline  160-189  mg/dL   High  >190     mg/dL   Very High   TRIG 64 07/04/2009     GEN- The patient is well appearing, alert and oriented x 3 today.   Head- normocephalic, atraumatic Eyes-  Sclera clear, conjunctiva pink Ears- hearing intact Oropharynx- clear Neck- supple, no JVP Lymph- no cervical lymphadenopathy Lungs- Clear to ausculation bilaterally, normal work of breathing Heart- regular rate and rhythm, (SR with PAc's) no murmurs, rubs or gallops, PMI not laterally displaced GI- soft, NT, ND, + BS Extremities- no clubbing, cyanosis, hard edema  rt worse than left  MS- no significant deformity or atrophy Skin- no rash or lesion Psych- euthymic mood, full affect Neuro- strength and sensation are intact   EKG-  Atrial paced at 60 bpm, pr int 266 ms, qrs int 100 ms, qtc 458 ms    Assessment and Plan: 1. Persistent  afib/flutter  has been quiet since on amiodarone and is on lwo dose at 100 mg daily I discussed with her that I could decrease the dose some more but may cause  afib to restart   pt did not want to risk the cahnce of afib returning  Continue amiodarone 100 mg  qd She will continue apixaban 2.5 mg bid for chadsvasc score of 4  She is pending a physical in May and will need Cmet/tsh for amiodarone monitoring   2. HTN Elevated today Is being seen in the HTN clinic    3. Low pulse ox readings  I believe this are false readings at home 2/2 to cold hands, poor peripheral  circulation She states at home, pulse ox  will improve with warming of hands In  the office today, pulse ox is 95-96% She does not describe any shortness of breath or cough that would be worrisome for amiodarone toxicity and her last CXR in 2021 did not show any interstitial changes  Therefore,  will leave amiodarone at current dose   F/u with Dr. Debara Pickett as scheduled, afib clinic as needed    Butch Penny C. Adan Baehr, [facility] [address] [place], [place] [address] [phone]

## 2020-05-05 ENCOUNTER — Other Ambulatory Visit: Payer: Self-pay | Admitting: Cardiovascular Disease

## 2020-05-07 NOTE — Progress Notes (Signed)
Remote pacemaker transmission.   

## 2020-05-08 ENCOUNTER — Other Ambulatory Visit: Payer: Self-pay | Admitting: Internal Medicine

## 2020-05-08 DIAGNOSIS — I4891 Unspecified atrial fibrillation: Secondary | ICD-10-CM

## 2020-05-08 NOTE — Telephone Encounter (Signed)
70f, 64.5kg, 1.79 02/17/20, lovw/carroll 04/26/20

## 2020-05-24 ENCOUNTER — Encounter (INDEPENDENT_AMBULATORY_CARE_PROVIDER_SITE_OTHER): Payer: Medicare Other | Admitting: Ophthalmology

## 2020-05-29 ENCOUNTER — Other Ambulatory Visit: Payer: Self-pay

## 2020-05-29 ENCOUNTER — Encounter (INDEPENDENT_AMBULATORY_CARE_PROVIDER_SITE_OTHER): Payer: Self-pay | Admitting: Ophthalmology

## 2020-05-29 ENCOUNTER — Ambulatory Visit (INDEPENDENT_AMBULATORY_CARE_PROVIDER_SITE_OTHER): Payer: Medicare Other | Admitting: Ophthalmology

## 2020-05-29 DIAGNOSIS — H353211 Exudative age-related macular degeneration, right eye, with active choroidal neovascularization: Secondary | ICD-10-CM | POA: Diagnosis not present

## 2020-05-29 DIAGNOSIS — H353124 Nonexudative age-related macular degeneration, left eye, advanced atrophic with subfoveal involvement: Secondary | ICD-10-CM | POA: Diagnosis not present

## 2020-05-29 MED ORDER — AFLIBERCEPT 2MG/0.05ML IZ SOLN FOR KALEIDOSCOPE
2.0000 mg | INTRAVITREAL | Status: AC | PRN
  Administered 2020-05-29: 2 mg via INTRAVITREAL

## 2020-05-29 NOTE — Assessment & Plan Note (Signed)
Subfoveal CNVM with active intraretinal fluid still slightly active at 11-week interval temporal to the fovea.  We will repeat injection Eylea today and maintain 11-week follow-up

## 2020-05-29 NOTE — Progress Notes (Signed)
05/29/2020     CHIEF COMPLAINT Patient presents for Retina Follow Up (11 Wk F/U OD, poss Eylea OD//Pt denies noticeable changes to [place] OU since last visit. Pt denies ocular pain, flashes of light, or floaters OU. //)   HISTORY OF PRESENT ILLNESS: Allison Norman is a 87 y.o. female who presents to the clinic today for:   HPI    Retina Follow Up    Diagnosis: Wet AMD   Laterality: right eye   Onset: 11 weeks ago   Severity: mild   Duration: 11 weeks   Course: stable   Comments: 11 Wk F/U OD, poss Eylea OD  Pt denies noticeable changes to [place] OU since last visit. Pt denies ocular pain, flashes of light, or floaters OU.          Last edited by Rockie Neighbours, [place] on 05/29/2020  2:03 PM. (History)      Referring physician: [name], MD [place],  [place] [address]  HISTORICAL INFORMATION:   Selected notes from the [facility]: No current outpatient medications on file. (Ophthalmic Drugs)   No current facility-administered medications for this visit. (Ophthalmic Drugs)   Current Outpatient Medications (Other)  Medication Sig  . amiodarone (PACERONE) 200 MG tablet Take 0.5 tablets (100 mg total) by mouth daily.  [name] amLODipine (NORVASC) 5 MG tablet Take 1 tablet by mouth once daily  . Bevacizumab (AVASTIN IV) Place into the right eye every 6 (six) weeks.   . carvedilol (COREG) 3.125 MG tablet Take 1 tablet (3.125 mg total) by mouth 2 (two) times daily with a meal.  . cloNIDine (CATAPRES) 0.3 MG tablet TAKE 1 TABLET BY MOUTH THREE TIMES DAILY  . diazepam (VALIUM) 2 MG tablet Take 2 mg by mouth at bedtime as needed (for sleep).   [name] ELIQUIS 2.5 MG TABS tablet Take 1 tablet by mouth twice daily  . EQ Fiber Supplement 2 g CHEW Chew 2 tablets by mouth daily.  . furosemide (LASIX) 20 MG tablet Take 20 mg by mouth daily.  [name] losartan (COZAAR) 100 MG tablet Take 1 tablet (100 mg total) by mouth daily. Per PCP  .  minoxidil (LONITEN) 2.5 MG tablet Take 2 tablets (5 mg total) by mouth in the morning AND 1 tablet (2.5 mg total) every evening.  . Multiple Vitamins-Minerals (PRESERVISION AREDS 2) CAPS Take 1 capsule by mouth daily.   No current facility-administered medications for this visit. (Other)      REVIEW OF SYSTEMS:    ALLERGIES Allergies  Allergen Reactions  . Codeine Itching  . Penicillins Itching, Rash and Other (See Comments)    Has patient had a PCN reaction causing immediate rash, facial/tongue/throat swelling, SOB or lightheadedness with hypotension: Yes Has patient had a PCN reaction causing severe rash involving mucus membranes or skin necrosis: No Has patient had a PCN reaction that required hospitalization: No Has patient had a PCN reaction occurring within the last 10 years: No If all of the above answers are "NO", then may proceed with Cephalosporin use.  . Doxazosin     Heavy feeling in chest, congestion, wheezing  . Doxycycline Other (See Comments)    Cause chest tightness  . Doxycycline Hyclate Other (See Comments)  . Hydralazine     Felt bad and could hear pulse in head     PAST MEDICAL HISTORY Past Medical History:  Diagnosis Date  . Atrial fibrillation ([place]) 2017  a. s/p DCCV in 10/2015  b. recurrent in 08/2016 --> rate-control pursued.   . Cancer ([place])    Breast  . CKD (chronic kidney disease)   . GCA (giant cell arteritis) ([place])   . Hordeolum internum left lower eyelid 06/28/2019  . Hypertension   . Macular degeneration   . Osteoporosis   . PMR (polymyalgia rheumatica) (HCC)   . Resistant hypertension 03/15/2019  . Shortness of breath 03/15/2019  . Skin cancer    Past Surgical History:  Procedure Laterality Date  . CARDIOVERSION N/A 10/18/2015   Procedure: CARDIOVERSION;  Surgeon: Jerline Pain, MD;  Location: [facility];  Service: Cardiovascular;  Laterality: N/A;  . CARDIOVERSION N/A 02/20/2017   Procedure: CARDIOVERSION;  Surgeon: Pixie Casino, MD;  [facility] ENDOSCOPY;  Service: Cardiovascular;  Laterality: N/A;  . CARDIOVERSION N/A 06/18/2017   Procedure: CARDIOVERSION;  Surgeon: Sanda Klein, MD;  Location: [facility] ENDOSCOPY;  Service: Cardiovascular;  Laterality: N/A;  . CARDIOVERSION N/A 12/21/2018   Procedure: CARDIOVERSION;  Surgeon: Thayer Headings, MD;  Location: [facility] ENDOSCOPY;  Service: Cardiovascular;  Laterality: N/A;  . KNEE SURGERY  2013  . MASTECTOMY  1998   left side  . PACEMAKER IMPLANT N/A 07/22/2019   Procedure: PACEMAKER IMPLANT;  Surgeon: Evans Lance, MD;  Location: [facility] CV LAB;  Service: Cardiovascular;  Laterality: N/A;    FAMILY HISTORY Family History  Problem Relation Age of Onset  . Stroke Mother   . Kidney failure Father        died at 24  . Heart disease Sister        died at 75  . Heart disease Sister        died at 80  . Breast cancer Daughter     SOCIAL HISTORY Social History   Tobacco Use  . Smoking status: Former Smoker    Quit date: 08/31/1966    Years since quitting: 53.7  . Smokeless tobacco: Never Used  Vaping Use  . Vaping Use: Never used  Substance Use Topics  . Alcohol use: No  . Drug use: No         OPHTHALMIC EXAM: Base Eye Exam    Visual Acuity (ETDRS)      Right Left   Dist cc 20/40 +2 CF @ 2'   Dist ph cc NI 20/400   Correction: Glasses       Tonometry (Tonopen, 2:06 PM)      Right Left   Pressure 15 12       Pupils      Pupils Dark Light Shape React APD   Right PERRL 5 4 Round Slow None   Left PERRL 5 4 Round Slow None       Visual Fields (Counting fingers)      Left Right     Full   Restrictions Partial outer superior nasal deficiency        Extraocular Movement      Right Left    Full Full       Neuro/Psych    Oriented x3: Yes   Mood/Affect: Normal       Dilation    Right eye: 1.0% Mydriacyl, 2.5% Phenylephrine @ 2:06 PM        Slit Lamp and Fundus Exam    External Exam      Right Left   External Normal Normal        Slit Lamp Exam      Right Left  Lids/Lashes Normal Normal   Conjunctiva/Sclera White and quiet White and quiet   Cornea Clear Clear   Anterior Chamber Deep and quiet Deep and quiet   Iris Round and reactive Round and reactive   Lens Posterior chamber intraocular lens, Open posterior capsule Posterior chamber intraocular lens, Open posterior capsule   Anterior Vitreous Normal Normal       Fundus Exam      Right Left   Posterior Vitreous Normal    Disc Normal    C/D Ratio 0.45    Macula Geographic atrophy, Advanced age related macular degeneration, Disciform scar nasal to faz, , Drusen, Retinal pigment epithelial mottling    Vessels Normal    Periphery Normal           IMAGING AND PROCEDURES  Imaging and Procedures for 05/29/20  OCT, Retina - OU - Both Eyes       Right Eye Quality was good. Scan locations included subfoveal. Central Foveal Thickness: 316. Progression has improved. Findings include intraretinal fluid, vitreomacular adhesion , pigment epithelial detachment, subretinal scarring, disciform scar.   Left Eye Quality was good. Scan locations included subfoveal. Central Foveal Thickness: 264. Progression has been stable. Findings include no SRF, retinal drusen , outer retinal atrophy, central retinal atrophy, subretinal hyper-reflective material, subretinal scarring.   Notes Pigment epithelial detachment with intraretinal fluid nasal to the fovea OD is stable,, at 11-week interval follow-up today, repeat injection Eylea today examination next 11 weeks  OS with advanced dry atrophic ARMD stable, no change in thickness OS, the computer selected lower baseline       Intravitreal Injection, Pharmacologic Agent - OD - Right Eye       Time Out 05/29/2020. 2:31 PM. Confirmed correct patient, procedure, site, and patient consented.   Anesthesia Topical anesthesia was used. Anesthetic medications included [place] 3.5%.   Procedure Preparation included  Tobramycin 0.3%, 10% betadine to eyelids, 5% betadine to ocular surface, Ofloxacin . A 30 gauge needle was used.   Injection:  2 mg aflibercept Alfonse Flavors) SOLN   NDC: [id number], Lot: [id number]   Route: Intravitreal, Site: Right Eye, Waste: 0 mg  Post-op Post injection exam found visual acuity of at least counting fingers. The patient tolerated the procedure well. There were no complications. The patient received written and verbal post procedure care education. Post injection medications were not given.                 ASSESSMENT/PLAN:  Advanced nonexudative age-related macular degeneration of left eye with subfoveal involvement Dry ARMD accounts for vision OS, no active CNVM lesions observed  Exudative age-related macular degeneration of right eye with active choroidal neovascularization (HCC) Subfoveal CNVM with active intraretinal fluid still slightly active at 11-week interval temporal to the fovea.  We will repeat injection Eylea today and maintain 11-week follow-up      ICD-10-CM   1. Exudative age-related macular degeneration of right eye with active choroidal neovascularization (HCC)  H35.3211 OCT, Retina - OU - Both Eyes    Intravitreal Injection, Pharmacologic Agent - OD - Right Eye    aflibercept (EYLEA) SOLN 2 mg  2. Advanced nonexudative age-related macular degeneration of left eye with subfoveal involvement  H35.3124     1.  OD with chronic recurrences of CNVM however now stable and maintained at 11-week follow-up interval.  We will repeat injection intravitreal Eylea today and examination next in 11 weeks  2.  Dilate OU next  3.  Ophthalmic Meds Ordered this visit:  Meds  ordered this encounter  Medications  . aflibercept (EYLEA) SOLN 2 mg       Return in about 11 weeks (around 08/14/2020) for DILATE OU, EYLEA OCT, OD.  There are no Patient Instructions on file for this visit.   Explained the diagnoses, plan, and follow up with the patient and they  expressed understanding.  Patient expressed understanding of the importance of proper follow up care.   Clent Demark Amogh Komatsu M.D. Diseases & Surgery of the Retina and Vitreous Retina & Diabetic [place] 05/29/20     Abbreviations: M myopia (nearsighted); A astigmatism; H hyperopia (farsighted); P presbyopia; Mrx spectacle prescription;  CTL contact lenses; OD right eye; OS left eye; OU both eyes  XT exotropia; ET esotropia; PEK punctate epithelial keratitis; PEE punctate epithelial erosions; DES dry eye syndrome; MGD meibomian gland dysfunction; ATs artificial tears; PFAT's preservative free artificial tears; [place] nuclear sclerotic cataract; PSC posterior subcapsular cataract; ERM epi-retinal membrane; PVD posterior vitreous detachment; RD retinal detachment; DM diabetes mellitus; DR diabetic retinopathy; NPDR non-proliferative diabetic retinopathy; PDR proliferative diabetic retinopathy; CSME clinically significant macular edema; DME diabetic macular edema; dbh dot blot hemorrhages; CWS cotton wool spot; POAG primary open angle glaucoma; C/D cup-to-disc ratio; HVF humphrey visual field; GVF goldmann visual field; OCT optical coherence tomography; IOP intraocular pressure; BRVO Branch retinal vein occlusion; CRVO central retinal vein occlusion; CRAO central retinal artery occlusion; BRAO branch retinal artery occlusion; RT retinal tear; SB scleral buckle; PPV pars plana vitrectomy; VH Vitreous hemorrhage; PRP panretinal laser photocoagulation; IVK intravitreal kenalog; VMT vitreomacular traction; MH Macular hole;  NVD neovascularization of the disc; NVE neovascularization elsewhere; AREDS age related eye disease study; ARMD age related macular degeneration; POAG primary open angle glaucoma; EBMD epithelial/anterior basement membrane dystrophy; ACIOL anterior chamber intraocular lens; IOL intraocular lens; PCIOL posterior chamber intraocular lens; Phaco/IOL phacoemulsification with intraocular lens placement;  [place] photorefractive keratectomy; LASIK laser assisted in situ keratomileusis; HTN hypertension; DM diabetes mellitus; COPD chronic obstructive pulmonary disease

## 2020-05-29 NOTE — Assessment & Plan Note (Signed)
Dry ARMD accounts for vision OS, no active CNVM lesions observed

## 2020-05-30 DIAGNOSIS — I4891 Unspecified atrial fibrillation: Secondary | ICD-10-CM | POA: Diagnosis not present

## 2020-05-30 DIAGNOSIS — Z79899 Other long term (current) drug therapy: Secondary | ICD-10-CM | POA: Diagnosis not present

## 2020-05-30 DIAGNOSIS — I10 Essential (primary) hypertension: Secondary | ICD-10-CM | POA: Diagnosis not present

## 2020-05-30 DIAGNOSIS — D6869 Other thrombophilia: Secondary | ICD-10-CM | POA: Diagnosis not present

## 2020-05-30 DIAGNOSIS — F411 Generalized anxiety disorder: Secondary | ICD-10-CM | POA: Diagnosis not present

## 2020-05-30 DIAGNOSIS — G4700 Insomnia, unspecified: Secondary | ICD-10-CM | POA: Diagnosis not present

## 2020-05-30 DIAGNOSIS — D649 Anemia, unspecified: Secondary | ICD-10-CM | POA: Diagnosis not present

## 2020-05-31 DIAGNOSIS — N184 Chronic kidney disease, stage 4 (severe): Secondary | ICD-10-CM | POA: Diagnosis not present

## 2020-05-31 DIAGNOSIS — K219 Gastro-esophageal reflux disease without esophagitis: Secondary | ICD-10-CM | POA: Diagnosis not present

## 2020-05-31 DIAGNOSIS — I4891 Unspecified atrial fibrillation: Secondary | ICD-10-CM | POA: Diagnosis not present

## 2020-05-31 DIAGNOSIS — E781 Pure hyperglyceridemia: Secondary | ICD-10-CM | POA: Diagnosis not present

## 2020-05-31 DIAGNOSIS — M179 Osteoarthritis of knee, unspecified: Secondary | ICD-10-CM | POA: Diagnosis not present

## 2020-05-31 DIAGNOSIS — G4700 Insomnia, unspecified: Secondary | ICD-10-CM | POA: Diagnosis not present

## 2020-05-31 DIAGNOSIS — N183 Chronic kidney disease, stage 3 unspecified: Secondary | ICD-10-CM | POA: Diagnosis not present

## 2020-05-31 DIAGNOSIS — I10 Essential (primary) hypertension: Secondary | ICD-10-CM | POA: Diagnosis not present

## 2020-05-31 DIAGNOSIS — D649 Anemia, unspecified: Secondary | ICD-10-CM | POA: Diagnosis not present

## 2020-05-31 DIAGNOSIS — Z853 Personal history of malignant neoplasm of breast: Secondary | ICD-10-CM | POA: Diagnosis not present

## 2020-06-04 ENCOUNTER — Other Ambulatory Visit: Payer: Self-pay | Admitting: Cardiovascular Disease

## 2020-06-12 NOTE — Progress Notes (Signed)
Patient ID: Allison Norman                 DOB: 09-10-1924                      MRN: [id number]     HPI: Allison Norman is a 87 y.o. female referred by Dr. Oval Norman to HTN clinic.  PMH includes atrial fibrillation, resistance hypertension, and CKD stage IV. Noted intolerance to doxazosin,hydralazine, and high dose amlodipine.   Patient presents accompany by her grand-daughter. Reports compliance with all therapy, and denies problems with current medication. Denies dizziness, lightheadedness, or falls.  Current HTN meds:  Amlodipine 5mg  daily Carvedilol 3.125mg  twice daily Clonidine 0.3mg  three times daily Furosemide 20mg  daily Losartan 100mg  daily Minoxidil 5mg  in AM and 2.5mg  in PM  Previously tried:  Amlodipine 10mg  - LEE Doxazosin Hydralazine Diltiazem  BP goal: <140/90  Family History: stroke in mother, kidney failure in father, heart disease in sister x 2, Breast cancer in daughter  Social History: former smoker, denies alcohol use  Diet: balanced, limit sodium as when possible diet  Exercise: activities of daily living  Home BP readings:  11 morning measurement, average 134/62 11 evening measurement, average 139/64  Wt Readings from Last 3 Encounters:  06/13/20 137 lb 8 oz (62.4 kg)  04/26/20 141 lb 3.2 oz (64 kg)  04/17/20 142 lb 3.2 oz (64.5 kg)   BP Readings from Last 3 Encounters:  06/13/20 (!) 120/48  04/26/20 (!) 160/70  04/17/20 (!) 162/68   Pulse Readings from Last 3 Encounters:  06/13/20 (!) 57  04/26/20 60  04/17/20 62    Past Medical History:  Diagnosis Date  . Atrial fibrillation ([place]) 2017   a. s/p DCCV in 10/2015  b. recurrent in 08/2016 --> rate-control pursued.   . Cancer ([place])    Breast  . CKD (chronic kidney disease)   . GCA (giant cell arteritis) ([place])   . Hordeolum internum left lower eyelid 06/28/2019  . Hypertension   . Macular degeneration   . Osteoporosis   . PMR (polymyalgia rheumatica) (HCC)   . Resistant hypertension  03/15/2019  . Shortness of breath 03/15/2019  . Skin cancer     Current Outpatient Medications on File Prior to Visit  Medication Sig Dispense Refill  . amiodarone (PACERONE) 200 MG tablet Take 0.5 tablets (100 mg total) by mouth daily. 45 tablet 2  . amLODipine (NORVASC) 5 MG tablet Take 1 tablet by mouth once daily 90 tablet 3  . Bevacizumab (AVASTIN IV) Place into the right eye every 6 (six) weeks.     . carvedilol (COREG) 3.125 MG tablet Take 1 tablet (3.125 mg total) by mouth 2 (two) times daily with a meal. 180 tablet 3  . cloNIDine (CATAPRES) 0.3 MG tablet TAKE 1 TABLET BY MOUTH THREE TIMES DAILY 180 tablet 3  . diazepam (VALIUM) 2 MG tablet Take 2 mg by mouth at bedtime as needed (for sleep).   0  . ELIQUIS 2.5 MG TABS tablet Take 1 tablet by mouth twice daily 180 tablet 1  . EQ Fiber Supplement 2 g CHEW Chew 2 tablets by mouth daily.    . furosemide (LASIX) 20 MG tablet Take 20 mg by mouth daily.    [name] losartan (COZAAR) 100 MG tablet Take 1 tablet (100 mg total) by mouth daily. Per PCP 90 tablet 1  . minoxidil (LONITEN) 2.5 MG tablet Take 2 tablets (5 mg total) by mouth in the  morning AND 1 tablet (2.5 mg total) every evening. 365 tablet 3  . Multiple Vitamins-Minerals (PRESERVISION AREDS 2) CAPS Take 1 capsule by mouth daily.    . predniSONE (DELTASONE) 5 MG tablet Take 5 mg by mouth daily.     No current facility-administered medications on file prior to visit.    Allergies  Allergen Reactions  . Codeine Itching  . Penicillins Itching, Rash and Other (See Comments)    Has patient had a PCN reaction causing immediate rash, facial/tongue/throat swelling, SOB or lightheadedness with hypotension: Yes Has patient had a PCN reaction causing severe rash involving mucus membranes or skin necrosis: No Has patient had a PCN reaction that required hospitalization: No Has patient had a PCN reaction occurring within the last 10 years: No If all of the above answers are "NO", then may proceed  with Cephalosporin use.  . Doxazosin     Heavy feeling in chest, congestion, wheezing  . Doxycycline Other (See Comments)    Cause chest tightness  . Doxycycline Hyclate Other (See Comments)  . Hydralazine     Felt bad and could hear pulse in head     Blood pressure (!) 120/48, pulse (!) 57, resp. rate 16, height 5' 5.5" (1.664 m), weight 137 lb 8 oz (62.4 kg), SpO2 93 %.  Essential hypertension Blood pressure very well controlled while in office. Noted some BP readings above 140 in the past 8 weeks, but continues to average < 140/90 for morning and evening measurements. Patient reports improvement in energy level, denies problems with current therapy, and also noted less variability in in daily BP measurements. BMET repateated at PCP office. Renal function and electrolytes remain stable.  Will continue current therapy without changes, and follow up in 6-8 weeks with Dr Allison Norman. Patient was encouraged to call clinic with any needs in between appointments.    Allison Norman PharmD, BCPS, [place] [address] [place] [address] 06/13/2020 4:37 PM

## 2020-06-13 ENCOUNTER — Other Ambulatory Visit: Payer: Self-pay

## 2020-06-13 ENCOUNTER — Ambulatory Visit (INDEPENDENT_AMBULATORY_CARE_PROVIDER_SITE_OTHER): Payer: Medicare Other | Admitting: Pharmacist

## 2020-06-13 VITALS — BP 120/48 | HR 57 | Resp 16 | Ht 65.5 in | Wt 137.5 lb

## 2020-06-13 DIAGNOSIS — I10 Essential (primary) hypertension: Secondary | ICD-10-CM | POA: Diagnosis not present

## 2020-06-13 NOTE — Patient Instructions (Addendum)
Return for a  follow up appointment in Dr Oval Linsey (will call to schedule)  Check your blood pressure at home daily (if able) and keep record of the readings.  Take your BP meds as follows:  *NO CHANGE*  Bring all of your meds, your BP cuff and your record of home blood pressures to your next appointment.  Exercise as you're able, try to walk approximately 30 minutes per day.  Keep salt intake to a minimum, especially watch canned and prepared boxed foods.  Eat more fresh fruits and vegetables and fewer canned items.  Avoid eating in fast food restaurants.    HOW TO TAKE YOUR BLOOD PRESSURE: . Rest 5 minutes before taking your blood pressure. .  Don't smoke or drink caffeinated beverages for at least 30 minutes before. . Take your blood pressure before (not after) you eat. . Sit comfortably with your back supported and both feet on the floor (don't cross your legs). . Elevate your arm to heart level on a table or a desk. . Use the proper sized cuff. It should fit smoothly and snugly around your bare upper arm. There should be enough room to slip a fingertip under the cuff. The bottom edge of the cuff should be 1 inch above the crease of the elbow. . Ideally, take 3 measurements at one sitting and record the average.

## 2020-06-13 NOTE — Assessment & Plan Note (Signed)
Blood pressure very well controlled while in office. Noted some BP readings above 140 in the past 8 weeks, but continues to average < 140/90 for morning and evening measurements. Patient reports improvement in energy level, denies problems with current therapy, and also noted less variability in in daily BP measurements. BMET repateated at PCP office. Renal function and electrolytes remain stable.  Will continue current therapy without changes, and follow up in 6-8 weeks with Dr Oval Linsey. Patient was encouraged to call clinic with any needs in between appointments.

## 2020-06-15 ENCOUNTER — Telehealth: Payer: Self-pay | Admitting: *Deleted

## 2020-06-15 NOTE — Telephone Encounter (Signed)
Scheduled follow up visit for 8/30 Offered sooner however patient has to have afternoon and this was first available afternoon Advised to call office if issues with blood pressure prior to visit

## 2020-06-15 NOTE — Telephone Encounter (Signed)
-----   Message from Harrington Challenger, [place] sent at 06/13/2020  4:22 PM EDT ----- Regarding: HTN Please call patient to schedule follow up with Dr Oval Linsey 6-8 weeks

## 2020-07-17 ENCOUNTER — Telehealth: Payer: Self-pay | Admitting: Internal Medicine

## 2020-07-17 NOTE — Telephone Encounter (Signed)
Pt called to report that she has been noticing her HR racing at night when laying in bed.... she says it does not feel like it is skipping but feels it is just beating fast.   She checks her HR by pulse OX and it reads between 103 and 104 the last week. She says her BP has been good 130's/ 70's.   She denies dizziness, SOB, chest pain.   She is asking of her Pacemaker can be checked. She does not know how to send a transmission. I will forward to the device clinic for review.   Pt says she is taking all of her meds.. I went over her med list with her.   I will also forward to A Tillery PA for recommendations since Dr. Lovena Le is our of the office.

## 2020-07-17 NOTE — Telephone Encounter (Signed)
Transmission received and reviewed. Presenting yesterday 07/16/20 appeared patient was in AT / slow AFL with controlled VR. Patient is currently on [place] - Eliquis. AT/AF burden 0%. Presenting today seen below, back in SR. Spoke to patient and updated about rhythm and that she was back in rhythm. States she has felt better over the past few days. Advised I will forward to A. Tillery for review and recommendations. Appreciative of call.

## 2020-07-17 NOTE — Telephone Encounter (Signed)
Called patient and advise of Andy's recommendations. Patient is agreeable to call if any symptoms arise. Appreciative of call.

## 2020-07-17 NOTE — Telephone Encounter (Signed)
Will await transmission.

## 2020-07-17 NOTE — Telephone Encounter (Signed)
STAT if HR is under 50 or over 120 (normal HR is 60-100 beats per minute)  What is your heart rate? 103  Do you have a log of your heart rate readings (document readings)? No.   Do you have any other symptoms? No  Patient has a pacemaker but her HR has been elevated. She checks it using a pulse ox on her finger. She is not sure what to do.

## 2020-07-19 DIAGNOSIS — D485 Neoplasm of uncertain behavior of skin: Secondary | ICD-10-CM | POA: Diagnosis not present

## 2020-07-19 DIAGNOSIS — L820 Inflamed seborrheic keratosis: Secondary | ICD-10-CM | POA: Diagnosis not present

## 2020-07-19 DIAGNOSIS — D0439 Carcinoma in situ of skin of other parts of face: Secondary | ICD-10-CM | POA: Diagnosis not present

## 2020-07-19 DIAGNOSIS — Z85828 Personal history of other malignant neoplasm of skin: Secondary | ICD-10-CM | POA: Diagnosis not present

## 2020-07-19 LAB — CUP PACEART REMOTE DEVICE CHECK
Battery Remaining Longevity: 84 mo
Battery Remaining Percentage: 100 %
Brady Statistic RA Percent Paced: 75 %
Brady Statistic RV Percent Paced: 1 %
Date Time Interrogation Session: 20220714035100
Implantable Lead Implant Date: 20210716
Implantable Lead Implant Date: 20210716
Implantable Lead Location: 753859
Implantable Lead Location: 753860
Implantable Lead Model: 7840
Implantable Lead Model: 7841
Implantable Lead Serial Number: 1017229
Implantable Lead Serial Number: 1090224
Implantable Pulse Generator Implant Date: 20210716
Lead Channel Impedance Value: 572 Ohm
Lead Channel Impedance Value: 741 Ohm
Lead Channel Pacing Threshold Amplitude: 1 V
Lead Channel Pacing Threshold Pulse Width: 0.4 ms
Lead Channel Setting Pacing Amplitude: 2 V
Lead Channel Setting Pacing Amplitude: 2.5 V
Lead Channel Setting Pacing Pulse Width: 0.4 ms
Lead Channel Setting Sensing Sensitivity: 2.5 mV
Pulse Gen Serial Number: 547553

## 2020-07-20 ENCOUNTER — Ambulatory Visit (INDEPENDENT_AMBULATORY_CARE_PROVIDER_SITE_OTHER): Payer: Medicare Other

## 2020-07-20 DIAGNOSIS — I480 Paroxysmal atrial fibrillation: Secondary | ICD-10-CM | POA: Diagnosis not present

## 2020-08-02 ENCOUNTER — Telehealth: Payer: Self-pay | Admitting: Cardiovascular Disease

## 2020-08-02 NOTE — Telephone Encounter (Signed)
Called patient, she states that for the last few weeks she has noticed her HR has been elevated (104 highest, but it is unlike her to have this) she states that she feels fine. Denies symptoms of lightheadedness, dizziness, SOB. She states that she does notice some swelling in her legs but she always has some so not sure of any difference. She was made an appointment on 08/03 with NP. Patient was advised she has some afib/flutter at times, this could be the cause but we would need to do an EKG to review this- but I did recommend if HR becomes extremely elevated or she starts having symptoms of SOB, dizziness, passing out she should call 911 and be evaluated.  Patient verbalized understanding.

## 2020-08-02 NOTE — Telephone Encounter (Signed)
STAT if HR is under 50 or over 120 (normal HR is 60-100 beats per minute)  What is your heart rate?  104 when taken right before phone call   Do you have a log of your heart rate readings (document readings)?  HR ranging between 90-104 for the past 3 weeks  Do you have any other symptoms? No    Notices it more at night. Has an appt scheduled for 08/08/20 in regards to this with Coletta Memos.

## 2020-08-05 NOTE — Progress Notes (Signed)
Cardiology Office Note:    Date:  08/05/2020   ID:  Allison Norman, DOB 1924-04-21, MRN [id number]  PCP:  [name], MD   [facility] Providers Cardiologist:  Pixie Casino, MD      Referring MD: [name], MD   Follow-up for hypertension  History of Present Illness:    Allison Norman is a 87 y.o. female with a hx of atrial fibrillation, resistant hypertension, status post pacemaker insertion 07/22/2019, and CKD stage IV.  She was last seen by clinical pharmacist to review her blood pressure.  Blood pressure goal was set up less than 140/90.  She previously tried: Amlodipine 10mg  - LEE Doxazosin Hydralazine Diltiazem  Her current blood pressure medications include: Amlodipine 5mg  daily Carvedilol 3.125mg  twice daily Clonidine 0.3mg  three times daily Furosemide 20mg  daily Losartan 100mg  daily Minoxidil 5mg  in AM and 2.5mg  in PM  She was last seen by clinical pharmacist 06/13/2020.  During that time her blood pressure was 120/48 and her pulse was 57.  Previous blood pressure readings were reviewed and showed significant improvement with only subtle blood pressures over 409 systolic.  She reported an improvement in her energy levels and denied side effects with her medication regimen.  She noted less variability with her daily blood pressure measurements.  Her renal function and electrolytes are stable.  Follow-up was planned for 6-8 weeks.  She presents the clinic today for follow-up and states she occasionally notices increased heart rates in the evening when she lays on her left side.  She also occasionally checks her pulse with her pulse oximeter and reports that her heart rate will be in the 90s and 100s.  She does not have any symptoms with these increased heart rates and unless she checks her pulse is not aware of increased heart rate.  We reviewed her remote device reports which showed appropriate histograms, battery, and weights.  She reports that  she does not drink many fluids through the day and may drink 3 to 4 cups of water total.  We discussed the importance of maintaining p.o. hydration.  I will have her follow-up in 6 months and as needed.  I would also like her to follow-up with EP in the next 3 to 4 months.  Today she denies chest pain, shortness of breath, lower extremity edema, fatigue, palpitations, melena, hematuria, hemoptysis, diaphoresis, weakness, presyncope, syncope, orthopnea, and PND.    Past Medical History:  Diagnosis Date   Atrial fibrillation ([place]) 2017   a. s/p DCCV in 10/2015  b. recurrent in 08/2016 --> rate-control pursued.    Cancer ([place])    Breast   CKD (chronic kidney disease)    GCA (giant cell arteritis) (HCC)    Hordeolum internum left lower eyelid 06/28/2019   Hypertension    Macular degeneration    Osteoporosis    PMR (polymyalgia rheumatica) (HCC)    Resistant hypertension 03/15/2019   Shortness of breath 03/15/2019   Skin cancer     Past Surgical History:  Procedure Laterality Date   CARDIOVERSION N/A 10/18/2015   Procedure: CARDIOVERSION;  Surgeon: Jerline Pain, MD;  Location: [facility];  Service: Cardiovascular;  Laterality: N/A;   CARDIOVERSION N/A 02/20/2017   Procedure: CARDIOVERSION;  Surgeon: Pixie Casino, MD;  Location: [facility];  Service: Cardiovascular;  Laterality: N/A;   CARDIOVERSION N/A 06/18/2017   Procedure: CARDIOVERSION;  Surgeon: Sanda Klein, MD;  Location: [facility];  Service: Cardiovascular;  Laterality: N/A;   CARDIOVERSION  N/A 12/21/2018   Procedure: CARDIOVERSION;  Surgeon: Acie Fredrickson Wonda Cheng, MD;  Location: [facility] ENDOSCOPY;  Service: Cardiovascular;  Laterality: N/A;   KNEE SURGERY  2013   MASTECTOMY  1998   left side   PACEMAKER IMPLANT N/A 07/22/2019   Procedure: PACEMAKER IMPLANT;  Surgeon: Evans Lance, MD;  Location: [facility] CV LAB;  Service: Cardiovascular;  Laterality: N/A;    Current Medications: No outpatient medications have been  marked as taking for the 08/08/20 encounter (Appointment) with Deberah Pelton, NP.     Allergies:   Codeine, Penicillins, Doxazosin, Doxycycline, Doxycycline hyclate, and Hydralazine   Social History   Socioeconomic History   Marital status: Married    Spouse name: Not on file   Number of children: 2   Years of education: Not on file   Highest education level: Not on file  Occupational History   Not on file  Tobacco Use   Smoking status: Former    Types: Cigarettes    Quit date: 08/31/1966    Years since quitting: 53.9   Smokeless tobacco: Never  Vaping Use   Vaping Use: Never used  Substance and Sexual Activity   Alcohol use: No   Drug use: No   Sexual activity: Not on file  Other Topics Concern   Not on file  Social History Narrative   epworth sleepiness scale = 8 (08/31/15)   Social Determinants of Health   Financial Resource Strain: Not on file  Food Insecurity: Not on file  Transportation Needs: Not on file  Physical Activity: Not on file  Stress: Not on file  Social Connections: Not on file     Family History: The patient's family history includes Breast cancer in her daughter; Heart disease in her sister and sister; Kidney failure in her father; Stroke in her mother.  ROS:   Please see the history of present illness.     All other systems reviewed and are negative.   Risk Assessment/Calculations:           Physical Exam:    VS:  There were no vitals taken for this visit.    Wt Readings from Last 3 Encounters:  06/13/20 137 lb 8 oz (62.4 kg)  04/26/20 141 lb 3.2 oz (64 kg)  04/17/20 142 lb 3.2 oz (64.5 kg)     GEN:  Well nourished, well developed in no acute distress HEENT: Normal NECK: No JVD; No carotid bruits LYMPHATICS: No lymphadenopathy CARDIAC: RRR,  systolic murmur 2 out of 6 heard along right upper sternal border, rubs, gallops RESPIRATORY:  Clear to auscultation without rales, wheezing or rhonchi  ABDOMEN: Soft, non-tender,  non-distended MUSCULOSKELETAL: Generalized bilateral lower extremity nonpitting edema; No deformity  SKIN: Warm and dry NEUROLOGIC:  Alert and oriented x 3 PSYCHIATRIC:  Normal affect    EKGs/Labs/Other Studies Reviewed:    The following studies were reviewed today: Echocardiogram 01/29/2018 IMPRESSIONS     1. Left ventricular ejection fraction, by estimation, is 65 to 70%. The  left ventricle has normal function. The left ventricle has no regional  wall motion abnormalities. There is severe concentric left ventricular  hypertrophy. Left ventricular diastolic   parameters are consistent with Grade III diastolic dysfunction  (restrictive). Elevated left atrial pressure.   2. Right ventricular systolic function is mildly reduced. The right  ventricular size is mildly enlarged. There is severely elevated pulmonary  artery systolic pressure. The estimated right ventricular systolic  pressure is 93.5 mmHg.   3. Left atrial  size was severely dilated.   4. Right atrial size was severely dilated.   5. The mitral valve is normal in structure. Mild to moderate mitral valve  regurgitation. No evidence of mitral stenosis. Moderate mitral annular  calcification.   6. Tricuspid valve regurgitation is moderate to severe.   7. The aortic valve is normal in structure. There is severe calcifcation  of the aortic valve. There is severe thickening of the aortic valve.  Aortic valve regurgitation is not visualized. Mild aortic valve stenosis.  Aortic valve mean gradient measures  12.0 mmHg.   8. The inferior vena cava is normal in size with greater than 50%  respiratory variability, suggesting right atrial pressure of 3 mmHg.  EKG:  EKG is  ordered today.  The ekg ordered today demonstrates V paced rhythm 76 bpm  Recent Labs: 02/17/2020: BUN 32; Creatinine, Ser 1.79; Potassium 5.0; Sodium 143  Recent Lipid Panel    Component Value Date/Time   CHOL  07/04/2009 0418    81        ATP III  CLASSIFICATION:  <200     mg/dL   Desirable  200-239  mg/dL   Borderline High  >=240    mg/dL   High          TRIG 64 07/04/2009 0418   HDL 18 (L) 07/04/2009 0418   CHOLHDL 4.5 07/04/2009 0418   VLDL 13 07/04/2009 0418   LDLCALC  07/04/2009 0418    50        Total Cholesterol/HDL:CHD Risk Coronary Heart Disease Risk Table                     Men   Women  1/2 Average Risk   3.4   3.3  Average Risk       5.0   4.4  2 X Average Risk   9.6   7.1  3 X Average Risk  23.4   11.0        Use the calculated Patient Ratio above and the CHD Risk Table to determine the patient's CHD Risk.        ATP III CLASSIFICATION (LDL):  <100     mg/dL   Optimal  100-129  mg/dL   Near or Above                    Optimal  130-159  mg/dL   Borderline  160-189  mg/dL   High  >190     mg/dL   Very High    ASSESSMENT & PLAN    Resistant hypertension-BP today 108/62.  Continues to be well controlled at home. Continue amlodipine 5 mg daily, carvedilol 3.125 mg twice daily, clonidine 0.3 mg 3 times daily, furosemide 20 mg daily, losartan 100 mg daily Heart healthy low-sodium diet-salty 6 given Increase physical activity as tolerated Maintain blood pressure log  Persistent atrial fibrillation-heart rate today 76 V-paced.  Status post PPM.  Reports occasional increased heart rate with pulse checks.  Denies injuries and bleeding issues.  No symptoms related with increased heart rate. Continue carvedilol, amiodarone, apixaban Heart healthy low-sodium diet-salty 6 given Increase physical activity as tolerated Maintain p.o. hydration  Lower extremity edema-generalized nonpitting bilateral knee lower extremity edema.  Echocardiogram showed hyperdynamic LV function with G3 DD. Continue carvedilol, furosemide Heart healthy low-sodium diet-salty 6 given Increase physical activity as tolerated Elevate lower extremities when active  Disposition: Follow-up with Dr. Debara Pickett in 6 months.  Medication  Adjustments/Labs and Tests Ordered: Current medicines are reviewed at length with the patient today.  Concerns regarding medicines are outlined above.  No orders of the defined types were placed in this encounter.  No orders of the defined types were placed in this encounter.   There are no Patient Instructions on file for this visit.   Signed, Deberah Pelton, NP  08/05/2020 2:24 PM      Notice: This dictation was prepared with Dragon dictation along with smaller phrase technology. Any transcriptional errors that result from this process are unintentional and may not be corrected upon review.  I spent 15 minutes examining this patient, reviewing medications, and using patient centered shared decision making involving her cardiac care.  Prior to her visit I spent greater than 20 minutes reviewing her past medical history,  medications, and prior cardiac tests.

## 2020-08-07 DIAGNOSIS — K219 Gastro-esophageal reflux disease without esophagitis: Secondary | ICD-10-CM | POA: Diagnosis not present

## 2020-08-07 DIAGNOSIS — I4891 Unspecified atrial fibrillation: Secondary | ICD-10-CM | POA: Diagnosis not present

## 2020-08-07 DIAGNOSIS — G4700 Insomnia, unspecified: Secondary | ICD-10-CM | POA: Diagnosis not present

## 2020-08-07 DIAGNOSIS — E781 Pure hyperglyceridemia: Secondary | ICD-10-CM | POA: Diagnosis not present

## 2020-08-07 DIAGNOSIS — I10 Essential (primary) hypertension: Secondary | ICD-10-CM | POA: Diagnosis not present

## 2020-08-07 DIAGNOSIS — M179 Osteoarthritis of knee, unspecified: Secondary | ICD-10-CM | POA: Diagnosis not present

## 2020-08-07 DIAGNOSIS — D649 Anemia, unspecified: Secondary | ICD-10-CM | POA: Diagnosis not present

## 2020-08-07 DIAGNOSIS — N184 Chronic kidney disease, stage 4 (severe): Secondary | ICD-10-CM | POA: Diagnosis not present

## 2020-08-08 ENCOUNTER — Ambulatory Visit (INDEPENDENT_AMBULATORY_CARE_PROVIDER_SITE_OTHER): Payer: Medicare Other | Admitting: General Practice

## 2020-08-08 ENCOUNTER — Other Ambulatory Visit: Payer: Self-pay

## 2020-08-08 ENCOUNTER — Encounter (HOSPITAL_BASED_OUTPATIENT_CLINIC_OR_DEPARTMENT_OTHER): Payer: Self-pay | Admitting: General Practice

## 2020-08-08 VITALS — BP 108/62 | HR 76 | Ht 65.5 in | Wt 142.2 lb

## 2020-08-08 DIAGNOSIS — I4819 Other persistent atrial fibrillation: Secondary | ICD-10-CM

## 2020-08-08 DIAGNOSIS — R600 Localized edema: Secondary | ICD-10-CM | POA: Diagnosis not present

## 2020-08-08 DIAGNOSIS — I10 Essential (primary) hypertension: Secondary | ICD-10-CM

## 2020-08-08 NOTE — Patient Instructions (Signed)
Medication Instructions:  Your physician recommends that you continue on your current medications as directed. Please refer to the Current Medication list given to you today.  *If you need a refill on your cardiac medications before your next appointment, please call your pharmacy*   Follow-Up: At [facility], you and your health needs are our priority.  As part of our continuing mission to provide you with exceptional heart care, we have created designated Provider Care Teams.  These Care Teams include your primary Cardiologist (physician) and Advanced Practice Providers (APPs -  Physician Assistants and Nurse Practitioners) who all work together to provide you with the care you need, when you need it.  We recommend signing up for the patient portal called "MyChart".  Sign up information is provided on this After Visit Summary.  MyChart is used to connect with patients for Virtual Visits (Telemedicine).  Patients are able to view lab/test results, encounter notes, upcoming appointments, etc.  Non-urgent messages can be sent to your provider as well.   To learn more about what you can do with MyChart, go to [email or web address].    Your next appointment:   6 month(s)  The format for your next appointment:   In Person  Provider:   Skeet Latch, MD at the [place] office  Follow-up in 3 months with Dr. Lovena Le at [facility] office.   Other Instructions Coletta Memos, FNP has recommended that you increase your hydration.

## 2020-08-13 NOTE — Progress Notes (Signed)
Remote pacemaker transmission.   

## 2020-08-14 ENCOUNTER — Encounter (INDEPENDENT_AMBULATORY_CARE_PROVIDER_SITE_OTHER): Payer: Self-pay | Admitting: Ophthalmology

## 2020-08-14 ENCOUNTER — Other Ambulatory Visit: Payer: Self-pay

## 2020-08-14 ENCOUNTER — Ambulatory Visit (INDEPENDENT_AMBULATORY_CARE_PROVIDER_SITE_OTHER): Payer: Medicare Other | Admitting: Ophthalmology

## 2020-08-14 DIAGNOSIS — H353211 Exudative age-related macular degeneration, right eye, with active choroidal neovascularization: Secondary | ICD-10-CM | POA: Diagnosis not present

## 2020-08-14 DIAGNOSIS — H353124 Nonexudative age-related macular degeneration, left eye, advanced atrophic with subfoveal involvement: Secondary | ICD-10-CM | POA: Diagnosis not present

## 2020-08-14 MED ORDER — AFLIBERCEPT 2MG/0.05ML IZ SOLN FOR KALEIDOSCOPE
2.0000 mg | INTRAVITREAL | Status: AC | PRN
  Administered 2020-08-14: 2 mg via INTRAVITREAL

## 2020-08-14 NOTE — Assessment & Plan Note (Signed)
Controlled and maintained at 11-week interval.  History of multiple recurrences.  We will repeat injection OD today and examination next in 12 weeks

## 2020-08-14 NOTE — Progress Notes (Signed)
08/14/2020     CHIEF COMPLAINT Patient presents for Retina Follow Up (11 week fu OU and Eylea OD/Pt states VA OU stable since last visit. Pt denies FOL, floaters, or ocular pain OU. /)   HISTORY OF PRESENT ILLNESS: Allison Norman is a 87 y.o. female who presents to the clinic today for:   HPI     Retina Follow Up           Diagnosis: Wet AMD   Laterality: right eye   Onset: 11 weeks ago   Severity: mild   Duration: 11 weeks   Course: stable   Comments: 11 week fu OU and Eylea OD Pt states VA OU stable since last visit. Pt denies FOL, floaters, or ocular pain OU.           Comments   No change in acuity OD, patient does complain of haziness left eye yet with dense central scotoma present left eye      Last edited by Hurman Horn, MD on 08/14/2020  3:12 PM.      Referring physician: [name], MD [place],  [place] [address]  HISTORICAL INFORMATION:   Selected notes from the [facility]: No current outpatient medications on file. (Ophthalmic Drugs)   No current facility-administered medications for this visit. (Ophthalmic Drugs)   Current Outpatient Medications (Other)  Medication Sig   amiodarone (PACERONE) 200 MG tablet Take 0.5 tablets (100 mg total) by mouth daily.   amLODipine (NORVASC) 5 MG tablet Take 1 tablet by mouth once daily   Bevacizumab (AVASTIN IV) Place into the right eye every 6 (six) weeks.    carvedilol (COREG) 3.125 MG tablet Take 1 tablet (3.125 mg total) by mouth 2 (two) times daily with a meal.   cloNIDine (CATAPRES) 0.3 MG tablet TAKE 1 TABLET BY MOUTH THREE TIMES DAILY   diazepam (VALIUM) 2 MG tablet Take 2 mg by mouth at bedtime as needed (for sleep).    ELIQUIS 2.5 MG TABS tablet Take 1 tablet by mouth twice daily   EQ Fiber Supplement 2 g CHEW Chew 2 tablets by mouth daily.   furosemide (LASIX) 20 MG tablet Take 20 mg by mouth daily.   losartan (COZAAR) 100 MG tablet  Take 1 tablet (100 mg total) by mouth daily. Per PCP   minoxidil (LONITEN) 2.5 MG tablet Take 2 tablets (5 mg total) by mouth in the morning AND 1 tablet (2.5 mg total) every evening.   Multiple Vitamins-Minerals (PRESERVISION AREDS 2) CAPS Take 1 capsule by mouth daily.   predniSONE (DELTASONE) 5 MG tablet Take 5 mg by mouth daily.   No current facility-administered medications for this visit. (Other)      REVIEW OF SYSTEMS:    ALLERGIES Allergies  Allergen Reactions   Codeine Itching   Penicillins Itching, Rash and Other (See Comments)    Has patient had a PCN reaction causing immediate rash, facial/tongue/throat swelling, SOB or lightheadedness with hypotension: Yes Has patient had a PCN reaction causing severe rash involving mucus membranes or skin necrosis: No Has patient had a PCN reaction that required hospitalization: No Has patient had a PCN reaction occurring within the last 10 years: No If all of the above answers are "NO", then may proceed with Cephalosporin use.   Doxazosin     Heavy feeling in chest, congestion, wheezing   Doxycycline Other (See Comments)    Cause chest tightness  Doxycycline Hyclate Other (See Comments)   Hydralazine     Felt bad and could hear pulse in head     PAST MEDICAL HISTORY Past Medical History:  Diagnosis Date   Atrial fibrillation ([place]) 2017   a. s/p DCCV in 10/2015  b. recurrent in 08/2016 --> rate-control pursued.    Cancer ([place])    Breast   CKD (chronic kidney disease)    GCA (giant cell arteritis) (HCC)    Hordeolum internum left lower eyelid 06/28/2019   Hypertension    Macular degeneration    Osteoporosis    PMR (polymyalgia rheumatica) (HCC)    Resistant hypertension 03/15/2019   Shortness of breath 03/15/2019   Skin cancer    Past Surgical History:  Procedure Laterality Date   CARDIOVERSION N/A 10/18/2015   Procedure: CARDIOVERSION;  Surgeon: Jerline Pain, MD;  Location: [facility];  Service: Cardiovascular;   Laterality: N/A;   CARDIOVERSION N/A 02/20/2017   Procedure: CARDIOVERSION;  Surgeon: Pixie Casino, MD;  Location: [facility];  Service: Cardiovascular;  Laterality: N/A;   CARDIOVERSION N/A 06/18/2017   Procedure: CARDIOVERSION;  Surgeon: Sanda Klein, MD;  Location: [facility] ENDOSCOPY;  Service: Cardiovascular;  Laterality: N/A;   CARDIOVERSION N/A 12/21/2018   Procedure: CARDIOVERSION;  Surgeon: Thayer Headings, MD;  [facility];  Service: Cardiovascular;  Laterality: N/A;   KNEE SURGERY  2013   MASTECTOMY  1998   left side   PACEMAKER IMPLANT N/A 07/22/2019   Procedure: PACEMAKER IMPLANT;  Surgeon: Evans Lance, MD;  Location: [place] CV LAB;  Service: Cardiovascular;  Laterality: N/A;    FAMILY HISTORY Family History  Problem Relation Age of Onset   Stroke Mother    Kidney failure Father        died at 37   Heart disease Sister        died at 9   Heart disease Sister        died at 48   Breast cancer Daughter     SOCIAL HISTORY Social History   Tobacco Use   Smoking status: Former    Types: Cigarettes    Quit date: 08/31/1966    Years since quitting: 53.9   Smokeless tobacco: Never  Vaping Use   Vaping Use: Never used  Substance Use Topics   Alcohol use: No   Drug use: No         OPHTHALMIC EXAM:  Base Eye Exam     Visual Acuity (ETDRS)       Right Left   Dist cc 20/30 -2 CF at 3'   Dist ph cc NI     Correction: Glasses         Tonometry (Tonopen, 2:16 PM)       Right Left   Pressure 13 16         Pupils       Pupils Dark Light Shape React APD   Right PERRL 5 4 Round Slow None   Left PERRL 5 4 Round Slow None         Visual Fields       Left Right     Full   Restrictions Central scotoma          Extraocular Movement       Right Left    Full Full         Neuro/Psych     Oriented x3: Yes   Mood/Affect: Normal  Dilation     Both eyes: 1.0% Mydriacyl, 2.5% Phenylephrine @ 2:16 PM            Slit Lamp and Fundus Exam     External Exam       Right Left   External Normal Normal         Slit Lamp Exam       Right Left   Lids/Lashes Normal Normal   Conjunctiva/Sclera White and quiet White and quiet   Cornea Clear Clear   Anterior Chamber Deep and quiet Deep and quiet   Iris Round and reactive Round and reactive   Lens Posterior chamber intraocular lens, Open posterior capsule Posterior chamber intraocular lens, Open posterior capsule   Anterior Vitreous Normal Normal         Fundus Exam       Right Left   Posterior Vitreous Normal Normal   Disc Normal Normal   C/D Ratio 0.45 0.5   Macula Geographic atrophy approaching nasal FAZ, Advanced age related macular degeneration, Disciform scar nasal to faz, , Drusen, Retinal pigment epithelial mottling Geographic atrophy, Advanced age related macular degeneration, Disciform scar, Drusen, Retinal pigment epithelial mottling   Vessels Normal Normal   Periphery Normal Normal            IMAGING AND PROCEDURES  Imaging and Procedures for 08/14/20  OCT, Retina - OU - Both Eyes       Right Eye Quality was good. Scan locations included subfoveal. Central Foveal Thickness: 290. Progression has improved. Findings include intraretinal fluid, vitreomacular adhesion , pigment epithelial detachment, subretinal scarring, disciform scar.   Left Eye Quality was good. Scan locations included subfoveal. Central Foveal Thickness: 296. Progression has been stable. Findings include no SRF, retinal drusen , outer retinal atrophy, central retinal atrophy, subretinal hyper-reflective material, subretinal scarring.   Notes Pigment epithelial detachment with intraretinal fluid nasal to the fovea OD is stable,, at 11-week interval follow-up today, repeat injection Eylea today examination next 12 weeks  OS with advanced dry atrophic ARMD stable, no change in thickness OS, the computer selected lower baseline      Intravitreal Injection, Pharmacologic Agent - OD - Right Eye       Time Out 08/14/2020. 3:11 PM. Confirmed correct patient, procedure, site, and patient consented.   Anesthesia Topical anesthesia was used. Anesthetic medications included Akten 3.5%.   Procedure Preparation included Tobramycin 0.3%, 10% betadine to eyelids, 5% betadine to ocular surface, Ofloxacin . A 30 gauge needle was used.   Injection: 2 mg aflibercept 2 MG/0.05ML   Route: Intravitreal, Site: Right Eye   NDC: [id number], Lot: [id number], Waste: 0 mL   Post-op Post injection exam found visual acuity of at least counting fingers. The patient tolerated the procedure well. There were no complications. The patient received written and verbal post procedure care education. Post injection medications were not given.              ASSESSMENT/PLAN:  Advanced nonexudative age-related macular degeneration of left eye with subfoveal involvement Accounts for acuity,  Exudative age-related macular degeneration of right eye with active choroidal neovascularization (HCC) Controlled and maintained at 11-week interval.  History of multiple recurrences.  We will repeat injection OD today and examination next in 12 weeks     ICD-10-CM   1. Exudative age-related macular degeneration of right eye with active choroidal neovascularization (HCC)  H35.3211 OCT, Retina - OU - Both Eyes    Intravitreal Injection, Pharmacologic Agent - OD - Right Eye  aflibercept (EYLEA) SOLN 2 mg    2. Advanced nonexudative age-related macular degeneration of left eye with subfoveal involvement  H35.3124       1.  OD, much less active CNVM, history of multiple recurrences stable Diette at 11 weeks post Eylea.  Repeat injection today and examination next in 12 weeks  2.  3.  Ophthalmic Meds Ordered this visit:  Meds ordered this encounter  Medications   aflibercept (EYLEA) SOLN 2 mg       Return in about 12 weeks (around  11/06/2020) for dilate, OD, EYLEA OCT.  There are no Patient Instructions on file for this visit.   Explained the diagnoses, plan, and follow up with the patient and they expressed understanding.  Patient expressed understanding of the importance of proper follow up care.   Clent Demark Arlester Keehan M.D. Diseases & Surgery of the Retina and Vitreous Retina & Diabetic [place] 08/14/20     Abbreviations: M myopia (nearsighted); A astigmatism; H hyperopia (farsighted); P presbyopia; Mrx spectacle prescription;  CTL contact lenses; OD right eye; OS left eye; OU both eyes  XT exotropia; ET esotropia; PEK punctate epithelial keratitis; PEE punctate epithelial erosions; DES dry eye syndrome; MGD meibomian gland dysfunction; ATs artificial tears; PFAT's preservative free artificial tears; [place] nuclear sclerotic cataract; PSC posterior subcapsular cataract; ERM epi-retinal membrane; PVD posterior vitreous detachment; RD retinal detachment; DM diabetes mellitus; DR diabetic retinopathy; NPDR non-proliferative diabetic retinopathy; PDR proliferative diabetic retinopathy; CSME clinically significant macular edema; DME diabetic macular edema; dbh dot blot hemorrhages; CWS cotton wool spot; POAG primary open angle glaucoma; C/D cup-to-disc ratio; HVF humphrey visual field; GVF goldmann visual field; OCT optical coherence tomography; IOP intraocular pressure; BRVO Branch retinal vein occlusion; CRVO central retinal vein occlusion; CRAO central retinal artery occlusion; BRAO branch retinal artery occlusion; RT retinal tear; SB scleral buckle; PPV pars plana vitrectomy; VH Vitreous hemorrhage; PRP panretinal laser photocoagulation; IVK intravitreal kenalog; VMT vitreomacular traction; MH Macular hole;  NVD neovascularization of the disc; NVE neovascularization elsewhere; AREDS age related eye disease study; ARMD age related macular degeneration; POAG primary open angle glaucoma; EBMD epithelial/anterior basement membrane  dystrophy; ACIOL anterior chamber intraocular lens; IOL intraocular lens; PCIOL posterior chamber intraocular lens; Phaco/IOL phacoemulsification with intraocular lens placement; [place] photorefractive keratectomy; LASIK laser assisted in situ keratomileusis; HTN hypertension; DM diabetes mellitus; COPD chronic obstructive pulmonary disease

## 2020-08-14 NOTE — Assessment & Plan Note (Signed)
Accounts for acuity,

## 2020-08-20 ENCOUNTER — Telehealth: Payer: Self-pay | Admitting: Cardiovascular Disease

## 2020-08-20 NOTE — Telephone Encounter (Signed)
Returned call to patient who states that she was calling in regard to swelling in her legs. Patient states that she recently saw Coletta Memos NP for this issue. Patient reports that her legs are slightly swollen and reports that she does take her Lasix 20mg  Daily. Patient reports that she does not like to increase the dose due to her Kidney function. Patient states that her legs may not look swollen with her compression stockings on but states that she can feel that they are swollen. Patient denies any other symptoms and states she does not know if she has gained any weight. Patient reports that her BP has been good and reports that her Heart rate has been in the low 100's, patient feels that her HR may be a little irregular but states she is unsure. Patient states she has an appointment to see Dr. Lovena Le in November for this. Patient also states that she had cancelled her appointment with Dr. Oval Linsey on 8/30 and reports that she would like to reschedule and see Dr. Oval Linsey sooner. Made patient an appointment for 9/22 with Dr. Oval Linsey. Advised patient to weigh herself daily and to call back with weight gain of 3 or more pounds overnight or 5 pounds in one week. Made patient aware of ED precautions should new or worsening symptoms develop. Patient verbalized understanding.   Will forward to MD for review.

## 2020-08-20 NOTE — Telephone Encounter (Signed)
Pt c/o swelling: STAT is pt has developed SOB within 24 hours  If swelling, where is the swelling located? Ankles and legs  How much weight have you gained and in what time span? Not sure  Have you gained 3 pounds in a day or 5 pounds in a week? No   Do you have a log of your daily weights (if so, list)? No   Are you currently taking a fluid pill? Yes  Are you currently SOB? no  Have you traveled recently? No

## 2020-08-27 ENCOUNTER — Encounter (HOSPITAL_COMMUNITY): Payer: Self-pay

## 2020-08-27 ENCOUNTER — Inpatient Hospital Stay (HOSPITAL_COMMUNITY)
Admission: EM | Admit: 2020-08-27 | Discharge: 2020-08-31 | DRG: 812 | Disposition: A | Discharge status: Home Health | Payer: Medicare Other | Attending: Internal Medicine | Admitting: Internal Medicine

## 2020-08-27 ENCOUNTER — Other Ambulatory Visit: Payer: Self-pay

## 2020-08-27 ENCOUNTER — Emergency Department (HOSPITAL_COMMUNITY): Payer: Medicare Other

## 2020-08-27 DIAGNOSIS — S81011A Laceration without foreign body, right knee, initial encounter: Secondary | ICD-10-CM | POA: Diagnosis not present

## 2020-08-27 DIAGNOSIS — Z9012 Acquired absence of left breast and nipple: Secondary | ICD-10-CM

## 2020-08-27 DIAGNOSIS — D62 Acute posthemorrhagic anemia: Principal | ICD-10-CM

## 2020-08-27 DIAGNOSIS — Z7901 Long term (current) use of anticoagulants: Secondary | ICD-10-CM

## 2020-08-27 DIAGNOSIS — Z23 Encounter for immunization: Secondary | ICD-10-CM

## 2020-08-27 DIAGNOSIS — Z96651 Presence of right artificial knee joint: Secondary | ICD-10-CM | POA: Diagnosis present

## 2020-08-27 DIAGNOSIS — K5900 Constipation, unspecified: Secondary | ICD-10-CM | POA: Diagnosis present

## 2020-08-27 DIAGNOSIS — I5032 Chronic diastolic (congestive) heart failure: Secondary | ICD-10-CM | POA: Diagnosis not present

## 2020-08-27 DIAGNOSIS — S81811A Laceration without foreign body, right lower leg, initial encounter: Secondary | ICD-10-CM | POA: Diagnosis not present

## 2020-08-27 DIAGNOSIS — Z87891 Personal history of nicotine dependence: Secondary | ICD-10-CM

## 2020-08-27 DIAGNOSIS — I10 Essential (primary) hypertension: Secondary | ICD-10-CM

## 2020-08-27 DIAGNOSIS — I130 Hypertensive heart and chronic kidney disease with heart failure and stage 1 through stage 4 chronic kidney disease, or unspecified chronic kidney disease: Secondary | ICD-10-CM | POA: Diagnosis not present

## 2020-08-27 DIAGNOSIS — W19XXXA Unspecified fall, initial encounter: Secondary | ICD-10-CM | POA: Diagnosis not present

## 2020-08-27 DIAGNOSIS — Z79899 Other long term (current) drug therapy: Secondary | ICD-10-CM

## 2020-08-27 DIAGNOSIS — Z8249 Family history of ischemic heart disease and other diseases of the circulatory system: Secondary | ICD-10-CM

## 2020-08-27 DIAGNOSIS — Z888 Allergy status to other drugs, medicaments and biological substances status: Secondary | ICD-10-CM

## 2020-08-27 DIAGNOSIS — I4819 Other persistent atrial fibrillation: Secondary | ICD-10-CM | POA: Diagnosis not present

## 2020-08-27 DIAGNOSIS — Z885 Allergy status to narcotic agent status: Secondary | ICD-10-CM

## 2020-08-27 DIAGNOSIS — M810 Age-related osteoporosis without current pathological fracture: Secondary | ICD-10-CM | POA: Diagnosis present

## 2020-08-27 DIAGNOSIS — Z043 Encounter for examination and observation following other accident: Secondary | ICD-10-CM | POA: Diagnosis not present

## 2020-08-27 DIAGNOSIS — Z881 Allergy status to other antibiotic agents status: Secondary | ICD-10-CM

## 2020-08-27 DIAGNOSIS — N184 Chronic kidney disease, stage 4 (severe): Secondary | ICD-10-CM | POA: Diagnosis not present

## 2020-08-27 DIAGNOSIS — N179 Acute kidney failure, unspecified: Secondary | ICD-10-CM | POA: Diagnosis not present

## 2020-08-27 DIAGNOSIS — Z20822 Contact with and (suspected) exposure to covid-19: Secondary | ICD-10-CM | POA: Diagnosis present

## 2020-08-27 DIAGNOSIS — M353 Polymyalgia rheumatica: Secondary | ICD-10-CM | POA: Diagnosis not present

## 2020-08-27 DIAGNOSIS — M25561 Pain in right knee: Secondary | ICD-10-CM | POA: Diagnosis not present

## 2020-08-27 DIAGNOSIS — R339 Retention of urine, unspecified: Secondary | ICD-10-CM | POA: Diagnosis present

## 2020-08-27 DIAGNOSIS — Z880 Allergy status to penicillin: Secondary | ICD-10-CM

## 2020-08-27 DIAGNOSIS — M316 Other giant cell arteritis: Secondary | ICD-10-CM | POA: Diagnosis present

## 2020-08-27 DIAGNOSIS — W01198A Fall on same level from slipping, tripping and stumbling with subsequent striking against other object, initial encounter: Secondary | ICD-10-CM | POA: Diagnosis present

## 2020-08-27 DIAGNOSIS — Z841 Family history of disorders of kidney and ureter: Secondary | ICD-10-CM

## 2020-08-27 DIAGNOSIS — Z7952 Long term (current) use of systemic steroids: Secondary | ICD-10-CM

## 2020-08-27 DIAGNOSIS — Z950 Presence of cardiac pacemaker: Secondary | ICD-10-CM

## 2020-08-27 DIAGNOSIS — H3530 Unspecified macular degeneration: Secondary | ICD-10-CM | POA: Diagnosis present

## 2020-08-27 DIAGNOSIS — Z85828 Personal history of other malignant neoplasm of skin: Secondary | ICD-10-CM

## 2020-08-27 DIAGNOSIS — N1832 Chronic kidney disease, stage 3b: Secondary | ICD-10-CM | POA: Diagnosis present

## 2020-08-27 HISTORY — DX: Acute posthemorrhagic anemia: D62

## 2020-08-27 LAB — CBC WITH DIFFERENTIAL/PLATELET
Abs Immature Granulocytes: 0.03 10*3/uL (ref 0.00–0.07)
Abs Immature Granulocytes: 0.03 10*3/uL (ref 0.00–0.07)
Basophils Absolute: 0 10*3/uL (ref 0.0–0.1)
Basophils Absolute: 0.1 10*3/uL (ref 0.0–0.1)
Basophils Relative: 1 %
Basophils Relative: 1 %
Eosinophils Absolute: 0.2 10*3/uL (ref 0.0–0.5)
Eosinophils Absolute: 0.2 10*3/uL (ref 0.0–0.5)
Eosinophils Relative: 3 %
Eosinophils Relative: 2 %
HCT: 35.7 % — ABNORMAL LOW (ref 36.0–46.0)
HCT: 29.8 % — ABNORMAL LOW (ref 36.0–46.0)
Hemoglobin: 11 g/dL — ABNORMAL LOW (ref 12.0–15.0)
Hemoglobin: 9.2 g/dL — ABNORMAL LOW (ref 12.0–15.0)
Immature Granulocytes: 0 %
Immature Granulocytes: 0 %
Lymphocytes Relative: 21 %
Lymphocytes Relative: 25 %
Lymphs Abs: 1.4 10*3/uL (ref 0.7–4.0)
Lymphs Abs: 2.1 10*3/uL (ref 0.7–4.0)
MCH: 28.6 pg (ref 26.0–34.0)
MCH: 28.8 pg (ref 26.0–34.0)
MCHC: 30.8 g/dL (ref 30.0–36.0)
MCHC: 30.9 g/dL (ref 30.0–36.0)
MCV: 93 fL (ref 80.0–100.0)
MCV: 93.1 fL (ref 80.0–100.0)
Monocytes Absolute: 0.8 10*3/uL (ref 0.1–1.0)
Monocytes Absolute: 1.1 10*3/uL — ABNORMAL HIGH (ref 0.1–1.0)
Monocytes Relative: 12 %
Monocytes Relative: 13 %
Neutro Abs: 4.3 10*3/uL (ref 1.7–7.7)
Neutro Abs: 4.9 10*3/uL (ref 1.7–7.7)
Neutrophils Relative %: 63 %
Neutrophils Relative %: 59 %
Platelets: 215 10*3/uL (ref 150–400)
Platelets: 200 10*3/uL (ref 150–400)
RBC: 3.84 MIL/uL — ABNORMAL LOW (ref 3.87–5.11)
RBC: 3.2 MIL/uL — ABNORMAL LOW (ref 3.87–5.11)
RDW: 16.2 % — ABNORMAL HIGH (ref 11.5–15.5)
RDW: 15.9 % — ABNORMAL HIGH (ref 11.5–15.5)
WBC: 6.9 10*3/uL (ref 4.0–10.5)
WBC: 8.3 10*3/uL (ref 4.0–10.5)
nRBC: 0 % (ref 0.0–0.2)
nRBC: 0 % (ref 0.0–0.2)

## 2020-08-27 MED ORDER — TRANEXAMIC ACID 1000 MG/10ML IV SOLN
1000.0000 mg | Freq: Once | INTRAVENOUS | Status: AC
  Administered 2020-08-27: 1000 mg via TOPICAL

## 2020-08-27 MED ORDER — ACETAMINOPHEN 325 MG PO TABS
650.0000 mg | ORAL_TABLET | Freq: Four times a day (QID) | ORAL | Status: DC | PRN
  Administered 2020-08-28: 650 mg via ORAL
  Filled 2020-08-27: qty 2

## 2020-08-27 MED ORDER — FENTANYL CITRATE PF 50 MCG/ML IJ SOSY
50.0000 ug | PREFILLED_SYRINGE | Freq: Once | INTRAMUSCULAR | Status: AC
  Administered 2020-08-27: 50 ug via INTRAVENOUS
  Filled 2020-08-27: qty 1

## 2020-08-27 MED ORDER — IBUPROFEN 400 MG PO TABS
400.0000 mg | ORAL_TABLET | Freq: Once | ORAL | Status: AC
  Administered 2020-08-27: 400 mg via ORAL
  Filled 2020-08-27: qty 1

## 2020-08-27 MED ORDER — LIDOCAINE-EPINEPHRINE (PF) 2 %-1:200000 IJ SOLN
20.0000 mL | Freq: Once | INTRAMUSCULAR | Status: AC
  Administered 2020-08-27: 20 mL via INTRADERMAL
  Filled 2020-08-27: qty 20

## 2020-08-27 MED ORDER — ACETAMINOPHEN 650 MG RE SUPP: 650.0000 mg | Freq: Four times a day (QID) | RECTAL | Status: DC | PRN

## 2020-08-27 MED ORDER — TETANUS-DIPHTH-ACELL PERTUSSIS 5-2.5-18.5 LF-MCG/0.5 IM SUSY
0.5000 mL | PREFILLED_SYRINGE | Freq: Once | INTRAMUSCULAR | Status: AC
  Administered 2020-08-27: 0.5 mL via INTRAMUSCULAR
  Filled 2020-08-27: qty 0.5

## 2020-08-27 MED ORDER — OXYCODONE HCL 5 MG PO TABS
5.0000 mg | ORAL_TABLET | Freq: Once | ORAL | Status: AC
  Administered 2020-08-27: 5 mg via ORAL
  Filled 2020-08-27: qty 1

## 2020-08-27 MED ORDER — ACETAMINOPHEN 500 MG PO TABS
1000.0000 mg | ORAL_TABLET | Freq: Once | ORAL | Status: AC
  Administered 2020-08-27: 1000 mg via ORAL
  Filled 2020-08-27: qty 2

## 2020-08-27 MED ORDER — LIDOCAINE-EPINEPHRINE (PF) 1 %-1:200000 IJ SOLN
10.0000 mL | Freq: Once | INTRAMUSCULAR | Status: AC
  Administered 2020-08-27: 10 mL via INTRADERMAL
  Filled 2020-08-27: qty 10

## 2020-08-27 NOTE — H&P (Signed)
History and Physical    PLEASE NOTE THAT DRAGON DICTATION SOFTWARE WAS USED IN THE CONSTRUCTION OF THIS NOTE.   Allison Norman JHE:[id number] DOB: 11/03/1924 DOA: 08/27/2020  PCP: [name], MD Patient coming from: home   I have personally briefly reviewed patient's old medical records in [place]  Chief Complaint: Laceration to the right lower extremity  HPI: Allison Norman is a 87 y.o. female with medical history significant for paroxysmal atrial fibrillation chronically on anticoagulated on Eliquis, hypertension, polymyalgia rheumatica on chronic prednisone therapy, who is admitted to [facility] on 08/27/2020 with acute blood loss anemia after presenting from home to [facility] ED complaining of right lower extremity laceration.   Patient reports that she was ambulating with her walker earlier today, when she tripped causing the anterior aspect of her right lower extremity to bump into the posterior aspect of her walker causing a laceration over the anterior aspect of the right knee.  She reports that she did not completely fall as a component of this trip, noting that the walker prevented this outcome, and specifically conveys that she did not hit her head as of component of this sequence.  She reports mild discomfort associated with the right knee over the aspect that made contact with her walker.  She notes that as a result of the above contact with her walker, that she developed a laceration associate the anterior aspect of the right knee, and noted persistent ensuing bleeding associated with this laceration, prompting her to present to [facility] emergency department for assistance with hemostasis relating to this wound.  Otherwise, she denies any additional acute arthralgias or myalgias, no additional lacerations.  Denies any chest pain, shortness of breath, palpitations, dizziness, presyncope, or syncope.  She also denies any recent subjective fever, chills, rigors, or  generalized myalgias.  No recent headache, neck stiffness, rhinitis, rhinorrhea, sore throat, shortness of breath, wheezing, cough, nausea, abdominal pain, diarrhea, or rash.  No recent melena or hematochezia.  Denies any recent known COVID-19 exposures.  She also denies any recent dysuria, gross hematuria, or change in urinary urgency/frequency.  She acknowledges a medical history that includes paroxysmal atrial fibrillation for which she is chronically anticoagulated on Eliquis, with most recent dose occurring on the morning of 08/27/2020.  Otherwise, she denies use of any additional blood thinners as an outpatient, including no aspirin.  Medical history also notable for polymyalgia rheumatica for which she is on chronic prednisone therapy, taking prednisone 5 mg p.o. every 48 hours.   She has a history of osteoarthritis involving the right knee status post total right knee replacement.  Per chart review, it appears that the patient has a baseline hemoglobin of 12-13, with most recent prior serum hemoglobin noted to be 12.0 on 05/30/2020.     ED Course:  Vital signs in the ED were notable for the following: Tetramex 97.9, heart rate 7997; blood pressure 120/66 -143/70, with most recent blood pressure noted to be 124/61; respiratory rate 16-18, oxygen saturation 94 to 97% on room air.  Labs were notable for the following: Initial CBC notable for hemoglobin 11 with RDW 16.2, platelet count 215.  Repeat CBC after hemostasis was achieved notable for hemoglobin 9.2 with normocytic/normochromic findings as well as RDW 15.9.  Screening COVID-19 PCR performed in the ED today was found to be negative.  Plan films of the right knee showed no evidence of acute fracture or dislocation, while showing status post total right knee replacement without  evidence of periprosthetic lucency or fracture.  Per my discussions with the EDP, there were some initial difficulties in achieving hemostasis relating to the right  lower extremity laceration with compression alone followed by TXA administration.  Subsequently, the EDP discussed the patient's case with the on-call orthopedic surgeon, who recommended closing the laceration with sutures, with ensuing application of pressure dressing.  This 10 cm laceration was subsequently closed with 10 sutures, following which pressure dressing was applied, with ensuing evidence of hemostasis.  EP confirms that this laceration was associated with venous bleed.   While in the ED, the following were administered: Oxycodone 5 mg p.o. x1, TXA 1 g x 1, ibuprofen 400 mg p.o. x1, acetaminophen 1 g p.o. x1.  In the setting of acute right knee laceration with initial difficulty in achieving hemostasis and with CBC reflecting a downward trend in hemoglobin relative to initial CBC as well as relative to her establish baseline hemoglobin range, the patient is being admitted for overnight observation in the setting of acute blood loss anemia.      Review of Systems: As per HPI otherwise 10 point review of systems negative.   Past Medical History:  Diagnosis Date   Atrial fibrillation ([place]) 2017   a. s/p DCCV in 10/2015  b. recurrent in 08/2016 --> rate-control pursued.    Cancer ([place])    Breast   CKD (chronic kidney disease)    GCA (giant cell arteritis) (HCC)    Hordeolum internum left lower eyelid 06/28/2019   Hypertension    Macular degeneration    Osteoporosis    PMR (polymyalgia rheumatica) (HCC)    Resistant hypertension 03/15/2019   Shortness of breath 03/15/2019   Skin cancer     Past Surgical History:  Procedure Laterality Date   CARDIOVERSION N/A 10/18/2015   Procedure: CARDIOVERSION;  Surgeon: Jerline Pain, MD;  Location: [place];  Service: Cardiovascular;  Laterality: N/A;   CARDIOVERSION N/A 02/20/2017   Procedure: CARDIOVERSION;  Surgeon: Pixie Casino, MD;  Location: [place];  Service: Cardiovascular;  Laterality: N/A;   CARDIOVERSION N/A 06/18/2017    Procedure: CARDIOVERSION;  Surgeon: Sanda Klein, MD;  Location: [place] ENDOSCOPY;  Service: Cardiovascular;  Laterality: N/A;   CARDIOVERSION N/A 12/21/2018   Procedure: CARDIOVERSION;  Surgeon: Thayer Headings, MD;  Location[facility] ENDOSCOPY;  Service: Cardiovascular;  Laterality: N/A;   KNEE SURGERY  2013   MASTECTOMY  1998   left side   PACEMAKER IMPLANT N/A 07/22/2019   Procedure: PACEMAKER IMPLANT;  Surgeon: Evans Lance, MD;  Location: [place] CV LAB;  Service: Cardiovascular;  Laterality: N/A;    Social History:  reports that she quit smoking about 54 years ago. Her smoking use included cigarettes. She has never used smokeless tobacco. She reports that she does not drink alcohol and does not use drugs.   Allergies  Allergen Reactions   Codeine Itching   Penicillins Itching, Rash and Other (See Comments)    Has patient had a PCN reaction causing immediate rash, facial/tongue/throat swelling, SOB or lightheadedness with hypotension: Yes Has patient had a PCN reaction causing severe rash involving mucus membranes or skin necrosis: No Has patient had a PCN reaction that required hospitalization: No Has patient had a PCN reaction occurring within the last 10 years: No If all of the above answers are "NO", then may proceed with Cephalosporin use.   Doxazosin     Heavy feeling in chest, congestion, wheezing   Doxycycline Other (See Comments)  Cause chest tightness   Doxycycline Hyclate Other (See Comments)   Hydralazine     Felt bad and could hear pulse in head     Family History  Problem Relation Age of Onset   Stroke Mother    Kidney failure Father        died at 73   Heart disease Sister        died at 59   Heart disease Sister        died at 86   Breast cancer Daughter     Family history reviewed and not pertinent    Prior to Admission medications   Medication Sig Start Date End Date Taking? Authorizing Provider  amiodarone (PACERONE) 200 MG tablet Take 0.5  tablets (100 mg total) by mouth daily. 02/21/20   Sherran Needs, NP  amLODipine (NORVASC) 5 MG tablet Take 1 tablet by mouth once daily 03/29/20   Skeet Latch, MD  Bevacizumab (AVASTIN IV) Place into the right eye every 6 (six) weeks.     [provider]  carvedilol (COREG) 3.125 MG tablet Take 1 tablet (3.125 mg total) by mouth 2 (two) times daily with a meal. 03/20/20 06/18/20  Skeet Latch, MD  cloNIDine (CATAPRES) 0.3 MG tablet TAKE 1 TABLET BY MOUTH THREE TIMES DAILY 06/05/20   Skeet Latch, MD  diazepam (VALIUM) 2 MG tablet Take 2 mg by mouth at bedtime as needed (for sleep).  06/23/16   [provider]  ELIQUIS 2.5 MG TABS tablet Take 1 tablet by mouth twice daily 05/08/20   Sherran Needs, NP  EQ Fiber Supplement 2 g CHEW Chew 2 tablets by mouth daily.    [provider]  furosemide (LASIX) 20 MG tablet Take 20 mg by mouth daily.    [provider]  losartan (COZAAR) 100 MG tablet Take 1 tablet (100 mg total) by mouth daily. Per PCP 04/17/20   Pixie Casino, MD  minoxidil (LONITEN) 2.5 MG tablet Take 2 tablets (5 mg total) by mouth in the morning AND 1 tablet (2.5 mg total) every evening. 03/20/20   Skeet Latch, MD  Multiple Vitamins-Minerals (PRESERVISION AREDS 2) CAPS Take 1 capsule by mouth daily.    [provider]  predniSONE (DELTASONE) 5 MG tablet Take 5 mg by mouth daily. 05/16/20   [provider]     Objective    Physical Exam: Vitals:   08/27/20 2100 08/27/20 2115 08/27/20 2130 08/27/20 2230  BP: 133/68  135/84 (!) 143/69  Pulse: 90 (!) 105 88 97  Resp: 18  17 16   Temp:      TempSrc:      SpO2: 94% 96% 94% 95%  Weight:      Height:        General: appears to be stated age; alert, oriented Skin: warm, dry, no rash Head:  AT/[place] Mouth:  Oral mucosa membranes appear moist, normal dentition Neck: supple; trachea midline Heart:  RRR; did not appreciate any M/R/G Lungs: CTAB, did not appreciate  any wheezes, rales, or rhonchi Abdomen: + BS; soft, ND, NT Vascular: 2+ pedal pulses b/l; 2+ radial pulses b/l Extremities: no peripheral edema, compression dressing noted in circumferential fashion around the right knee without gross evidence of active bleeding; no muscle wasting Neuro: strength and sensation intact in upper and lower extremities b/l     Labs on Admission: I have personally reviewed following labs and imaging studies  CBC: Recent Labs  Lab 08/27/20 1538 08/27/20 2204  WBC 6.9 8.3  NEUTROABS 4.3 4.9  HGB 11.0* 9.2*  HCT 35.7* 29.8*  MCV 93.0 93.1  PLT 215 810   Basic Metabolic Panel: No results for input(s): NA, K, CL, CO2, GLUCOSE, BUN, CREATININE, CALCIUM, MG, PHOS in the last 168 hours. GFR: CrCl cannot be calculated (Patient's most recent lab result is older than the maximum 21 days allowed.). Liver Function Tests: No results for input(s): AST, ALT, ALKPHOS, BILITOT, PROT, ALBUMIN in the last 168 hours. No results for input(s): LIPASE, AMYLASE in the last 168 hours. No results for input(s): AMMONIA in the last 168 hours. Coagulation Profile: No results for input(s): INR, PROTIME in the last 168 hours. Cardiac Enzymes: No results for input(s): CKTOTAL, CKMB, CKMBINDEX, TROPONINI in the last 168 hours. BNP (last 3 results) No results for input(s): PROBNP in the last 8760 hours. HbA1C: No results for input(s): HGBA1C in the last 72 hours. CBG: No results for input(s): GLUCAP in the last 168 hours. Lipid Profile: No results for input(s): CHOL, HDL, LDLCALC, TRIG, CHOLHDL, LDLDIRECT in the last 72 hours. Thyroid Function Tests: No results for input(s): TSH, T4TOTAL, FREET4, T3FREE, THYROIDAB in the last 72 hours. Anemia Panel: No results for input(s): VITAMINB12, FOLATE, FERRITIN, TIBC, IRON, RETICCTPCT in the last 72 hours. Urine analysis:    Component Value Date/Time   COLORURINE YELLOW 08/14/2010 [place] 08/14/2010 1245    LABSPEC 1.009 08/14/2010 1245   PHURINE 7.0 08/14/2010 1245   GLUCOSEU NEGATIVE 08/14/2010 1245   HGBUR NEGATIVE 08/14/2010 1245   [place] 08/14/2010 1245   [place] 08/14/2010 1245   PROTEINUR NEGATIVE 08/14/2010 1245   UROBILINOGEN 0.2 08/14/2010 1245   NITRITE NEGATIVE 08/14/2010 1245   LEUKOCYTESUR NEGATIVE 08/14/2010 1245    Radiological Exams on Admission: DG Knee 2 Views Right  Result Date: 08/27/2020 CLINICAL DATA:  History of fall EXAM: RIGHT KNEE - 1-2 VIEW COMPARISON:  None. FINDINGS: No acute fracture or dislocation. Status post total right knee replacement with no evidence of periprosthetic lucency or fracture. Cannot evaluate for pleural effusion due to overlying bandage. IMPRESSION: No acute fracture or dislocation. Electronically Signed   By: Yetta Glassman M.D.   On: 08/27/2020 16:28       Assessment/Plan   Allison Norman is a 87 y.o. female with medical history significant for paroxysmal atrial fibrillation chronically on anticoagulated on Eliquis, hypertension, polymyalgia rheumatica on chronic prednisone therapy, who is admitted to [facility] on 08/27/2020 with acute blood loss anemia after presenting from home to [facility] ED complaining of right lower extremity laceration.    Principal Problem:   Acute blood loss anemia Active Problems:   Essential hypertension   Polymyalgia rheumatica (HCC)   Persistent atrial fibrillation (HCC)   Laceration of right lower extremity   CKD (chronic kidney disease), stage IV (HCC)    #) Acute blood loss anemia: In the setting of presenting acute 10 cm laceration to the anterior aspect of the right knee associated with venous bleed, but with preliminary difficulty in achieving hemostasis, patient's hemoglobin, prior to CBCs obtained in the ED today is trended down to 9.2 relative to presenting hemoglobin of 11, and compared to her baseline hemoglobin range of 12-13, including most recent prior value of  12 in May 2022.  Hemostasis ultimately achieved via 10 sutures with ensuing pressure dressing applied per recommendations via orthopedic surgery consultation, as further detailed above.  Patient is currently asymptomatic, and appears hemodynamically stable.  However, in the  setting of a 87 year old with likely suboptimal reserve, will closely monitor on telemetry, close monitoring of ensuing blood pressures as well as hemoglobin trend, as detailed below.  Patient's history of polymyalgia rheumatica on chronic prednisone therapy is also that noted, with increased skin friability as a consequence of this chronic prednisone therapy potentially contributing to the initial difficulty with hemostasis and proclivity for development of laceration.    Plan: Every 4 H&H's ordered through 9 AM tomorrow morning.  Type and screen has been ordered.  Monitor on telemetry.  Check PTT and INR.  We will hold this evening's dose of Eliquis, with reevaluation in the morning to determine appropriate timing of resumption of this medication.  Refraining from pharmacologic DVT prophylaxis this evening.  Continue pressure dressing per recommendation of orthopedic surgery consultation, as above.  Close monitoring of ensuing blood pressure via routine vital signs.      #) Paroxysmal atrial fibrillation: Documented history of such.  Rhythm control strategy pursued as an outpatient, the patient on daily oral amiodarone.  Outpatient AV nodal blocking regimen: Coreg. In the setting of a CHA2DS2-VASc score of 4, there is an indication for the patient to be on chronic anticoagulation for thromboembolic prophylaxis. Consistent with this, the patient is chronically anticoagulated on Eliquis.  Resident reports large laceration over the anterior aspect of the right knee with ensuing development of acute blood loss anemia following initially difficult achievement of hemostasis, will hold home Eliquis dose this evening, with plan to reevaluate  in the morning for determination of timing of resumption of this medication.  Plan: Continue home amiodarone and Eliquis this evening, with plan to reevaluate timing of resumption of morning.  Monitor on telemetry check CMP and serum magnesium level.        #) Essential hypertension: Documented history of such on several antihypertensive medications as an outpatient, including Norvasc, Coreg, clonidine, losartan, and minoxidil.  Has been normotensive in the ED and spite of development of acute blood loss anemia, as above.  In this context, will hold home evening doses of her home BP medications, while closely monitoring ensuing hemodynamic status as well as close monitoring of hemoglobin trend.   Plan: Holding home antihypertensive medications for now.  Further evaluation management of presenting acute blood loss anemia, as further detailed above.  Close monitoring of ensuing blood pressures via routine vital signs.  Every 4 hour H&H's through 9 AM tomorrow as well as CBC in the morning.       #) Polymyalgia rheumatica: On chronic prednisone therapy via prednisone 5 mg p.o. every 48 hours as an outpatient, with most recent dose occurring on 08/26/2020.   Plan: Continue home prednisone, with next dose to occur on the morning of 08/28/2020.      #) Stage IV chronic kidney disease: Documented history of such with baseline creatinine 1.8-2.0, with most recent prior serum creatinine data point noted to be 1.79 in February 2022.  Of note, renal function has not been assessed from a laboratory standpoint thus far over her ED course.   Plan: Check CMP.  Monitor strict I's and O's and daily weights.  Tempt avoid nephrotoxic agents.    DVT prophylaxis: SCD limited to left lower extremity Code Status: Full code Family Communication: none Disposition Plan: Per Rounding Team Consults called: On-call orthopedic surgeon consulted, as further detailed above;  Admission status: Observation; med  telemetry     Of note, this patient was added by me to the following Admit List/Treatment Team: [email or web address].  PLEASE NOTE THAT DRAGON DICTATION SOFTWARE WAS USED IN THE CONSTRUCTION OF THIS NOTE.   [place] [facility] Pager [phone] From [place]  Otherwise, please contact night-coverage  [email or web address] Password [email or web address]   08/27/2020, 11:01 PM

## 2020-08-27 NOTE — ED Provider Notes (Signed)
[facility] EMERGENCY DEPARTMENT Provider Note   CSN: [id number] Arrival date & time: 08/27/20  1403     History  CC - laceration   Allison Norman is a 87 y.o. female.  HPI  87 year old female with a past medical history of atrial fibrillation on Eliquis presenting to the emergency department following a fall.  Patient reports that she uses a walker at baseline.  She states that she had to use the bathroom urgently, and was racing to the house with her walker when she believes her walker got caught on the carpet.  She fell forward and struck her knee on the metal bar of her walker and then fell backwards onto the floor.  She did not hit her head or lose consciousness.  She denies any headache, neck pain, chest pain, abdomen pain, back pain.  She states that she has right knee pain, sharp, constant, burning, nonradiating.  EMS reported large laceration over the right knee that needed a pressure bandage to control the bleeding.  Past Medical History:  Diagnosis Date   Atrial fibrillation ([place]) 2017   a. s/p DCCV in 10/2015  b. recurrent in 08/2016 --> rate-control pursued.    Cancer ([place])    Breast   CKD (chronic kidney disease)    GCA (giant cell arteritis) (HCC)    Hordeolum internum left lower eyelid 06/28/2019   Hypertension    Macular degeneration    Osteoporosis    PMR (polymyalgia rheumatica) (HCC)    Resistant hypertension 03/15/2019   Shortness of breath 03/15/2019   Skin cancer     Patient Active Problem List   Diagnosis Date Noted   Advanced nonexudative age-related macular degeneration of right eye without subfoveal involvement 03/15/2020   Pacemaker 11/01/2019   Tachycardia-bradycardia syndrome ([place]) 08/05/2019   Bradycardia 07/05/2019   Meibomian blepharitis, left 06/28/2019   Exudative age-related macular degeneration of right eye with active choroidal neovascularization ([place]) 05/03/2019   Cystoid macular edema of right eye 05/03/2019   Advanced  nonexudative age-related macular degeneration of left eye with subfoveal involvement 05/03/2019   Resistant hypertension 03/15/2019   Shortness of breath 03/15/2019   Edema leg 01/19/2019   Fusion beats 08/03/2017   Atypical atrial flutter (HCC)    Persistent atrial fibrillation ([place]) 08/19/2016   On anticoagulant therapy 07/10/2016   Renal insufficiency 12/07/2015   PAF (paroxysmal atrial fibrillation) (HCC)    Atrial fibrillation with RVR ([place]) 08/31/2015   CRI (chronic renal insufficiency), stage 4 (severe) ([place]) 08/31/2015   Essential hypertension 07/16/2009   TEMPORAL ARTERITIS 07/16/2009   POLYMYALGIA RHEUMATICA 07/16/2009   MRSA 07/02/2009   PYOGENIC ARTHRITIS, LOWER LEG 07/02/2009    Past Surgical History:  Procedure Laterality Date   CARDIOVERSION N/A 10/18/2015   Procedure: CARDIOVERSION;  Surgeon: Jerline Pain, MD;  Location: [place];  Service: Cardiovascular;  Laterality: N/A;   CARDIOVERSION N/A 02/20/2017   Procedure: CARDIOVERSION;  Surgeon: Pixie Casino, MD;  Location: [facility] ENDOSCOPY;  Service: Cardiovascular;  Laterality: N/A;   CARDIOVERSION N/A 06/18/2017   Procedure: CARDIOVERSION;  Surgeon: Sanda Klein, MD;  Location: [place] ENDOSCOPY;  Service: Cardiovascular;  Laterality: N/A;   CARDIOVERSION N/A 12/21/2018   Procedure: CARDIOVERSION;  Surgeon: Thayer Headings, MD;  Location: [facility] ENDOSCOPY;  Service: Cardiovascular;  Laterality: N/A;   KNEE SURGERY  2013   MASTECTOMY  1998   left side   PACEMAKER IMPLANT N/A 07/22/2019   Procedure: PACEMAKER IMPLANT;  Surgeon: Evans Lance, MD;  Location:  [place] INVASIVE CV LAB;  Service: Cardiovascular;  Laterality: N/A;     OB History   No obstetric history on file.     Family History  Problem Relation Age of Onset   Stroke Mother    Kidney failure Father        died at 90   Heart disease Sister        died at 39   Heart disease Sister        died at 57   Breast cancer Daughter     Social History    Tobacco Use   Smoking status: Former    Types: Cigarettes    Quit date: 08/31/1966    Years since quitting: 54.0   Smokeless tobacco: Never  Vaping Use   Vaping Use: Never used  Substance Use Topics   Alcohol use: No   Drug use: No    Home Medications Prior to Admission medications   Medication Sig Start Date End Date Taking? Authorizing Provider  amiodarone (PACERONE) 200 MG tablet Take 0.5 tablets (100 mg total) by mouth daily. 02/21/20   Sherran Needs, NP  amLODipine (NORVASC) 5 MG tablet Take 1 tablet by mouth once daily 03/29/20   Skeet Latch, MD  Bevacizumab (AVASTIN IV) Place into the right eye every 6 (six) weeks.     [provider]  carvedilol (COREG) 3.125 MG tablet Take 1 tablet (3.125 mg total) by mouth 2 (two) times daily with a meal. 03/20/20 06/18/20  Skeet Latch, MD  cloNIDine (CATAPRES) 0.3 MG tablet TAKE 1 TABLET BY MOUTH THREE TIMES DAILY 06/05/20   Skeet Latch, MD  diazepam (VALIUM) 2 MG tablet Take 2 mg by mouth at bedtime as needed (for sleep).  06/23/16   [provider]  ELIQUIS 2.5 MG TABS tablet Take 1 tablet by mouth twice daily 05/08/20   Sherran Needs, NP  EQ Fiber Supplement 2 g CHEW Chew 2 tablets by mouth daily.    [provider]  furosemide (LASIX) 20 MG tablet Take 20 mg by mouth daily.    [provider]  losartan (COZAAR) 100 MG tablet Take 1 tablet (100 mg total) by mouth daily. Per PCP 04/17/20   Pixie Casino, MD  minoxidil (LONITEN) 2.5 MG tablet Take 2 tablets (5 mg total) by mouth in the morning AND 1 tablet (2.5 mg total) every evening. 03/20/20   Skeet Latch, MD  Multiple Vitamins-Minerals (PRESERVISION AREDS 2) CAPS Take 1 capsule by mouth daily.    [provider]  predniSONE (DELTASONE) 5 MG tablet Take 5 mg by mouth daily. 05/16/20   [provider]    Allergies    Codeine, Penicillins, Doxazosin, Doxycycline, Doxycycline hyclate, and Hydralazine  Review  of Systems   Review of Systems  Constitutional:  Negative for chills and fever.  HENT:  Negative for ear pain and sore throat.   Eyes:  Negative for pain and visual disturbance.  Respiratory:  Negative for cough and shortness of breath.   Cardiovascular:  Negative for chest pain and palpitations.  Gastrointestinal:  Negative for abdominal pain and vomiting.  Genitourinary:  Negative for dysuria and hematuria.  Musculoskeletal:  Negative for arthralgias and back pain.  Skin:  Positive for wound. Negative for color change and rash.  Neurological:  Negative for seizures and syncope.  All other systems reviewed and are negative.  Physical Exam Updated Vital Signs BP (!) 159/61   Pulse 83   Temp 97.9 F (36.6 C) (  Oral)   Resp 18   Ht 5' 5.5" (1.664 m)   Wt 63.5 kg   SpO2 96%   BMI 22.94 kg/m   Physical Exam Vitals and nursing note reviewed.  Constitutional:      General: She is not in acute distress.    Appearance: Normal appearance. She is well-developed and normal weight.  HENT:     Head: Normocephalic and atraumatic.  Eyes:     Conjunctiva/sclera: Conjunctivae normal.  Cardiovascular:     Rate and Rhythm: Normal rate and regular rhythm.     Heart sounds: No murmur heard. Pulmonary:     Effort: Pulmonary effort is normal. No respiratory distress.     Breath sounds: Normal breath sounds.  Abdominal:     Palpations: Abdomen is soft.     Tenderness: There is no abdominal tenderness.  Musculoskeletal:        General: Signs of injury present.     Cervical back: Normal range of motion and neck supple. No rigidity or tenderness.     Comments: 10 cm laceration obliquely across the inferior aspect of the right knee.  Nonpulsatile but brisk venous bleeding from the site whenever not actively compressed.  Fat obvious, but no obvious joint violation or bone visible.  Range of motion intact.  Skin:    General: Skin is warm and dry.     Capillary Refill: Capillary refill takes less  than 2 seconds.  Neurological:     Mental Status: She is alert and oriented to person, place, and time.    ED Results / Procedures / Treatments   Labs (all labs ordered are listed, but only abnormal results are displayed) Labs Reviewed  CBC WITH DIFFERENTIAL/PLATELET - Abnormal; Notable for the following components:      Result Value   RBC 3.84 (*)    Hemoglobin 11.0 (*)    HCT 35.7 (*)    RDW 16.2 (*)    All other components within normal limits    EKG None  Radiology DG Knee 2 Views Right  Result Date: 08/27/2020 CLINICAL DATA:  History of fall EXAM: RIGHT KNEE - 1-2 VIEW COMPARISON:  None. FINDINGS: No acute fracture or dislocation. Status post total right knee replacement with no evidence of periprosthetic lucency or fracture. Cannot evaluate for pleural effusion due to overlying bandage. IMPRESSION: No acute fracture or dislocation. Electronically Signed   By: Yetta Glassman M.D.   On: 08/27/2020 16:28    Procedures Procedures   Medications Ordered in ED Medications  Tdap (BOOSTRIX) injection 0.5 mL (0.5 mLs Intramuscular Given 08/27/20 1424)  lidocaine-EPINEPHrine (XYLOCAINE W/EPI) 2 %-1:200000 (PF) injection 20 mL (20 mLs Intradermal Given by Other 08/27/20 1535)  fentaNYL (SUBLIMAZE) injection 50 mcg (50 mcg Intravenous Given 08/27/20 1533)  oxyCODONE (Oxy IR/ROXICODONE) immediate release tablet 5 mg (5 mg Oral Given 08/27/20 1523)  acetaminophen (TYLENOL) tablet 1,000 mg (1,000 mg Oral Given 08/27/20 1523)  ibuprofen (ADVIL) tablet 400 mg (400 mg Oral Given 08/27/20 1523)  fentaNYL (SUBLIMAZE) injection 50 mcg (50 mcg Intravenous Given 08/27/20 1619)  tranexamic acid (CYKLOKAPRON) injection 1,000 mg (1,000 mg Topical Given by Other 08/27/20 1653)   ED Course  I have reviewed the triage vital signs and the nursing notes.  Pertinent labs & imaging results that were available during my care of the patient were reviewed by me and considered in my medical decision making (see  chart for details).   MDM Rules/Calculators/A&P  87 year old female on anticoagulation presenting after mechanical fall with a knee laceration.  Vital signs reviewed, within acceptable limits.  Physical exam is notable for a [place] laceration to the inferior aspect of the right knee.  There is active brisk bleeding.  Control initially attempted with multiple absorbable figure-of-eight sutures without success.  Quick clot bandage is applied and pressure wrapping applied.  Plain radiographs obtained.  After open fracture is ruled out, we will more thoroughly explored the wound and attempt hemostasis and wound repair.  On my brief exploration at bedside, the wound appears quite superficial does not seem to violate the joint capsule, but will also further assess this once the patient has been anesthetized and open fractures ruled out.  Plain radiograph negative for fracture.  After more thorough exploration at bedside, it clearly does not violate the joint capsule itself.  However, patient has multiple areas of brisk bleeding.  Multiple figure-of-eight sutures applied without success.  Patient also had a pressure bandage applied for around 30 minutes without success.  On reevaluation, the patient's bleeding appears to be coming from inferior to the wound margins from part of this shear injury, unable to find the vessel that needs to be ligated.  When the bleeding is tamponade and the wound margins itself, she develops a large and tense hematoma that eventually evacuates into the wound itself when pressure is applied.  Will soaked gauze and TXA and apply a pressure bandage with this gauze on the wound edge.  Patient will then likely need the hematoma evacuated in order to facilitate primary repair.  Care of patient signed out to oncoming team.  Please see their note for remainder of ED care and ultimate disposition.  Final Clinical Impression(s) / ED Diagnoses Final diagnoses:  Acute  pain of right knee    Rx / DC Orders ED Discharge Orders     None        Claud Kelp, MD 08/27/20 [place], [place], [place] 08/28/20 [phone]

## 2020-08-27 NOTE — Progress Notes (Signed)
Brief note regarding preliminary plan, with full H&P to follow:  87 year old female with history of paroxysmal atrial fibrillation chronically anticoagulated on Eliquis who is being admitted for acute blood loss anemia stemming from acute right lower extremity laceration that occurred as she was ambulating earlier today in the absence of overt fall.  There was reportedly initial difficulty in obtaining hemostasis with compression over the laceration and following TXA administration.  Orthopedic surgery consulted, with ensuing recommendation to close the laceration and apply a pressure dressing in the setting of this venous bleed, and will follow.  Laceration was subsequently closed and pressure dressing applied, with good reported ensuing hemostasis. however, repeat hemoglobin demonstrated interval decline from 11-9.  Patient currently asymptomatic, and appears hemodynamically stable. Will closely monitor every 4 hour hemoglobin trend overnight while closely monitoring ensuing VS and telemetry.  Type and screen.    Babs Bertin, DO Hospitalist

## 2020-08-27 NOTE — ED Triage Notes (Signed)
Pt comes from home via EMS. Pt uses walker at baseline, Pt unsure whether walker got caught on carpet or what happened. Pt reports she fell forward and hit knee on metal bar of walker then fell backward. Denies LOC or dizziness, did not hit head. Pt has lac to right knee bleeding controlled with pressure. Pt a/o x4. BP 176/90 HR 90 O2 92 on RA, placed on 2 LNC, denied sob Temp 98.2

## 2020-08-27 NOTE — ED Provider Notes (Signed)
Assumed care of patient from Claud Kelp M.D. at 1700.  Patient is a 87 year old female with a past medical history of atrial fibrillation on Eliquis that presented to the ED following a fall.  Patient struck her right knee on the metal part of her walker.  She denies hitting her head or losing consciousness.  Patient has a 10 cm laceration to the right lower extremity.  Patient had multiple areas of brisk bleeding secondary to venous bleed.  Prior provider tried multiple figure-of-eight sutures without success of stopping bleeding.  A pressure bandage was applied for around 30 minutes without success.  TXA was applied with a pressure bandage and removed. Hemostasis was still not achieved.   Orthopedic surgery was consulted for evaluation and to possibly take the patient to the OR.  They recommended for [place] to close the laceration in the ED to promote hemostasis.  Laceration was repaired at the bedside with no complications.  A pressure dressing was applied to the repaired laceration. Sensation and motor are intact. No signs of compartment syndrome. Bleeding is hemostatic. Patient has 2+ DP and PT pulses in her right leg.   Repeat hemoglobin showed an interval decline from a hemoglobin of 11 to 9.  Patient is currently asymptomatic and hemodynamically stable.  Patient will be admitted to hospitalist for observation overnight.    [name].Laceration Repair  Date/Time: 08/27/2020 10:20 PM Performed by: Doretha Sou, MD Authorized by: Carmin Muskrat, MD   Consent:    Consent obtained:  Verbal   Consent given by:  Patient   Risks, benefits, and alternatives were discussed: yes     Risks discussed:  Infection, pain, need for additional repair, poor cosmetic result, tendon damage, retained foreign body, vascular damage, poor wound healing and nerve damage   Alternatives discussed:  No treatment Universal protocol:    Procedure explained and questions answered to patient or proxy's satisfaction: yes      Immediately prior to procedure, a time out was called: yes     Patient identity confirmed:  Verbally with patient, hospital-assigned identification number and arm band Anesthesia:    Anesthesia method:  Local infiltration   Local anesthetic:  Lidocaine 1% WITH epi Laceration details:    Location: Anterior aspect of right leg.   Length (cm):  10 Pre-procedure details:    Preparation:  Patient was prepped and draped in usual sterile fashion Exploration:    Wound exploration: entire depth of wound visualized   Treatment:    Area cleansed with:  Chlorhexidine   Amount of cleaning:  Standard   Irrigation solution:  Sterile saline Skin repair:    Repair method:  Sutures   Suture size:  3-0   Wound skin closure material used: Ethilon.   Suture technique:  Horizontal mattress   Number of sutures:  10 Approximation:    Approximation:  Close Repair type:    Repair type:  Complex Post-procedure details:    Dressing:  Adhesive bandage (pressure dressing)   Procedure completion:  Tolerated well, no immediate complications      Doretha Sou, MD 08/28/20 [place]    Carmin Muskrat, MD 08/30/20 0004

## 2020-08-28 ENCOUNTER — Encounter (HOSPITAL_COMMUNITY): Payer: Self-pay | Admitting: Internal Medicine

## 2020-08-28 DIAGNOSIS — I130 Hypertensive heart and chronic kidney disease with heart failure and stage 1 through stage 4 chronic kidney disease, or unspecified chronic kidney disease: Secondary | ICD-10-CM | POA: Diagnosis present

## 2020-08-28 DIAGNOSIS — K5900 Constipation, unspecified: Secondary | ICD-10-CM | POA: Diagnosis present

## 2020-08-28 DIAGNOSIS — N184 Chronic kidney disease, stage 4 (severe): Secondary | ICD-10-CM | POA: Diagnosis present

## 2020-08-28 DIAGNOSIS — Z23 Encounter for immunization: Secondary | ICD-10-CM | POA: Diagnosis not present

## 2020-08-28 DIAGNOSIS — K828 Other specified diseases of gallbladder: Secondary | ICD-10-CM | POA: Diagnosis not present

## 2020-08-28 DIAGNOSIS — N179 Acute kidney failure, unspecified: Secondary | ICD-10-CM | POA: Diagnosis present

## 2020-08-28 DIAGNOSIS — Z87891 Personal history of nicotine dependence: Secondary | ICD-10-CM | POA: Diagnosis not present

## 2020-08-28 DIAGNOSIS — Z880 Allergy status to penicillin: Secondary | ICD-10-CM | POA: Diagnosis not present

## 2020-08-28 DIAGNOSIS — Z950 Presence of cardiac pacemaker: Secondary | ICD-10-CM | POA: Diagnosis not present

## 2020-08-28 DIAGNOSIS — Z20822 Contact with and (suspected) exposure to covid-19: Secondary | ICD-10-CM | POA: Diagnosis present

## 2020-08-28 DIAGNOSIS — S81811A Laceration without foreign body, right lower leg, initial encounter: Secondary | ICD-10-CM

## 2020-08-28 DIAGNOSIS — D62 Acute posthemorrhagic anemia: Secondary | ICD-10-CM | POA: Diagnosis present

## 2020-08-28 DIAGNOSIS — M353 Polymyalgia rheumatica: Secondary | ICD-10-CM | POA: Diagnosis present

## 2020-08-28 DIAGNOSIS — M810 Age-related osteoporosis without current pathological fracture: Secondary | ICD-10-CM | POA: Diagnosis present

## 2020-08-28 DIAGNOSIS — H3530 Unspecified macular degeneration: Secondary | ICD-10-CM | POA: Diagnosis present

## 2020-08-28 DIAGNOSIS — W01198A Fall on same level from slipping, tripping and stumbling with subsequent striking against other object, initial encounter: Secondary | ICD-10-CM | POA: Diagnosis present

## 2020-08-28 DIAGNOSIS — Z888 Allergy status to other drugs, medicaments and biological substances status: Secondary | ICD-10-CM | POA: Diagnosis not present

## 2020-08-28 DIAGNOSIS — I4819 Other persistent atrial fibrillation: Secondary | ICD-10-CM | POA: Diagnosis present

## 2020-08-28 DIAGNOSIS — Z043 Encounter for examination and observation following other accident: Secondary | ICD-10-CM | POA: Diagnosis not present

## 2020-08-28 DIAGNOSIS — M316 Other giant cell arteritis: Secondary | ICD-10-CM | POA: Diagnosis present

## 2020-08-28 DIAGNOSIS — Z85828 Personal history of other malignant neoplasm of skin: Secondary | ICD-10-CM | POA: Diagnosis not present

## 2020-08-28 DIAGNOSIS — Z96651 Presence of right artificial knee joint: Secondary | ICD-10-CM | POA: Diagnosis present

## 2020-08-28 DIAGNOSIS — Z885 Allergy status to narcotic agent status: Secondary | ICD-10-CM | POA: Diagnosis not present

## 2020-08-28 DIAGNOSIS — I5032 Chronic diastolic (congestive) heart failure: Secondary | ICD-10-CM | POA: Diagnosis present

## 2020-08-28 DIAGNOSIS — Z9012 Acquired absence of left breast and nipple: Secondary | ICD-10-CM | POA: Diagnosis not present

## 2020-08-28 DIAGNOSIS — N1832 Chronic kidney disease, stage 3b: Secondary | ICD-10-CM | POA: Diagnosis present

## 2020-08-28 DIAGNOSIS — Z881 Allergy status to other antibiotic agents status: Secondary | ICD-10-CM | POA: Diagnosis not present

## 2020-08-28 DIAGNOSIS — S81011A Laceration without foreign body, right knee, initial encounter: Secondary | ICD-10-CM | POA: Diagnosis present

## 2020-08-28 DIAGNOSIS — R339 Retention of urine, unspecified: Secondary | ICD-10-CM | POA: Diagnosis present

## 2020-08-28 DIAGNOSIS — N3289 Other specified disorders of bladder: Secondary | ICD-10-CM | POA: Diagnosis not present

## 2020-08-28 DIAGNOSIS — D649 Anemia, unspecified: Secondary | ICD-10-CM | POA: Diagnosis not present

## 2020-08-28 HISTORY — DX: Laceration without foreign body, right lower leg, initial encounter: S81.811A

## 2020-08-28 LAB — CBC
HCT: 25.8 % — ABNORMAL LOW (ref 36.0–46.0)
Hemoglobin: 8.2 g/dL — ABNORMAL LOW (ref 12.0–15.0)
MCH: 29.1 pg (ref 26.0–34.0)
MCHC: 31.8 g/dL (ref 30.0–36.0)
MCV: 91.5 fL (ref 80.0–100.0)
Platelets: 183 10*3/uL (ref 150–400)
RBC: 2.82 MIL/uL — ABNORMAL LOW (ref 3.87–5.11)
RDW: 15.9 % — ABNORMAL HIGH (ref 11.5–15.5)
WBC: 10 10*3/uL (ref 4.0–10.5)
nRBC: 0 % (ref 0.0–0.2)

## 2020-08-28 LAB — COMPREHENSIVE METABOLIC PANEL
ALT: 18 U/L (ref 0–44)
AST: 23 U/L (ref 15–41)
Albumin: 2.9 g/dL — ABNORMAL LOW (ref 3.5–5.0)
Alkaline Phosphatase: 72 U/L (ref 38–126)
Anion gap: 8 (ref 5–15)
BUN: 42 mg/dL — ABNORMAL HIGH (ref 8–23)
CO2: 21 mmol/L — ABNORMAL LOW (ref 22–32)
Calcium: 7.8 mg/dL — ABNORMAL LOW (ref 8.9–10.3)
Chloride: 107 mmol/L (ref 98–111)
Creatinine, Ser: 2.1 mg/dL — ABNORMAL HIGH (ref 0.44–1.00)
GFR, Estimated: 21 mL/min — ABNORMAL LOW (ref >60)
Glucose, Bld: 106 mg/dL — ABNORMAL HIGH (ref 70–99)
Potassium: 4.6 mmol/L (ref 3.5–5.1)
Sodium: 136 mmol/L (ref 135–145)
Total Bilirubin: 0.9 mg/dL (ref 0.3–1.2)
Total Protein: 5 g/dL — ABNORMAL LOW (ref 6.5–8.1)

## 2020-08-28 LAB — PROTIME-INR
INR: 1.5 — ABNORMAL HIGH (ref 0.8–1.2)
Prothrombin Time: 18.4 seconds — ABNORMAL HIGH (ref 11.4–15.2)

## 2020-08-28 LAB — HEMOGLOBIN AND HEMATOCRIT, BLOOD
HCT: 25.6 % — ABNORMAL LOW (ref 36.0–46.0)
HCT: 28.9 % — ABNORMAL LOW (ref 36.0–46.0)
Hemoglobin: 7.9 g/dL — ABNORMAL LOW (ref 12.0–15.0)
Hemoglobin: 9 g/dL — ABNORMAL LOW (ref 12.0–15.0)

## 2020-08-28 LAB — TYPE AND SCREEN
ABO/RH(D): O POS
Antibody Screen: NEGATIVE

## 2020-08-28 LAB — RESP PANEL BY RT-PCR (FLU A&B, COVID) ARPGX2
Influenza A by PCR: NEGATIVE
Influenza B by PCR: NEGATIVE
SARS Coronavirus 2 by RT PCR: NEGATIVE

## 2020-08-28 LAB — MAGNESIUM: Magnesium: 2.3 mg/dL (ref 1.7–2.4)

## 2020-08-28 LAB — APTT: aPTT: 31 seconds (ref 24–36)

## 2020-08-28 MED ORDER — ONDANSETRON HCL 4 MG/2ML IJ SOLN
4.0000 mg | Freq: Four times a day (QID) | INTRAMUSCULAR | Status: DC | PRN
  Administered 2020-08-28 – 2020-08-29 (×2): 4 mg via INTRAVENOUS
  Filled 2020-08-28 (×2): qty 2

## 2020-08-28 MED ORDER — ONDANSETRON 4 MG PO TBDP
4.0000 mg | ORAL_TABLET | Freq: Three times a day (TID) | ORAL | Status: DC | PRN
  Administered 2020-08-28: 4 mg via ORAL
  Filled 2020-08-28: qty 1

## 2020-08-28 MED ORDER — AMIODARONE HCL 200 MG PO TABS
100.0000 mg | ORAL_TABLET | Freq: Every day | ORAL | Status: DC
  Administered 2020-08-28 – 2020-08-31 (×4): 100 mg via ORAL
  Filled 2020-08-28 (×4): qty 1

## 2020-08-28 MED ORDER — DEXTROSE-NACL 5-0.45 % IV SOLN: INTRAVENOUS | Status: DC

## 2020-08-28 MED ORDER — MELATONIN 3 MG PO TABS
3.0000 mg | ORAL_TABLET | Freq: Once | ORAL | Status: AC
  Administered 2020-08-28: 3 mg via ORAL
  Filled 2020-08-28: qty 1

## 2020-08-28 MED ORDER — AMLODIPINE BESYLATE 5 MG PO TABS
5.0000 mg | ORAL_TABLET | Freq: Every day | ORAL | Status: DC
  Administered 2020-08-28 – 2020-08-30 (×3): 5 mg via ORAL
  Filled 2020-08-28 (×3): qty 1

## 2020-08-28 MED ORDER — CARVEDILOL 3.125 MG PO TABS: 3.1250 mg | ORAL_TABLET | Freq: Two times a day (BID) | ORAL | Status: DC

## 2020-08-28 MED ORDER — PREDNISONE 5 MG PO TABS
5.0000 mg | ORAL_TABLET | ORAL | Status: DC
  Administered 2020-08-28 – 2020-08-30 (×2): 5 mg via ORAL
  Filled 2020-08-28 (×2): qty 1

## 2020-08-28 MED ORDER — APIXABAN 2.5 MG PO TABS
2.5000 mg | ORAL_TABLET | Freq: Two times a day (BID) | ORAL | Status: DC
  Administered 2020-08-29: 2.5 mg via ORAL
  Filled 2020-08-28: qty 1

## 2020-08-28 NOTE — Progress Notes (Signed)
Occupational Therapy Evaluation Patient Details Name: Allison Norman MRN: [id number] DOB: 06-01-1924 Today's Date: 08/28/2020    History of Present Illness Pt is a 87 y.o. F who presents with acute blood loss anemia due to RLE laceration. Significant PMH: paroxysmal atrial fibrillation, HTN, polymyalgia rheumatica.   Clinical Impression   [name] was evaluated s/p the above RLE from a fall at home. PTA pt was mod I with with of a rollator and completed ADLs indep. She lives in a townhouse with 2 STE alone, her daughter is actively attempting to coordinate 24/7 care for discharge. Upon evaluation pt was min-mod A for all mobility with RW, pt has poor standing balance and required total A for rear perineal hygiene after toileting tasks. While sitting on the [facility] pt reported feeling hot and faint, symptoms resolved after a few minutes of sitting. Pt is very fearful of her laceration opening and bleeding again but denies pain. She would benefit from continued OT acutely. Recommend SNF at discharge if pt's family cannot coordinate 24/7 care.     Follow Up Recommendations  SNF;Home health OT;Supervision/Assistance - 24 hour (SNF if pt is unable to set up 24/7 support at home along wtih [facility].)    Equipment Recommendations  3 in 1 bedside commode (RW)       Precautions / Restrictions Precautions Precautions: Fall Restrictions Weight Bearing Restrictions: No      Mobility Bed Mobility Overal bed mobility: Needs Assistance Bed Mobility: Supine to Sit     Supine to sit: Min assist     General bed mobility comments: pt in chair upon arrival, returned to chair    Transfers Overall transfer level: Needs assistance Equipment used: Rolling walker (2 wheeled) Transfers: Sit to/from [facility] Sit to Stand: Mod assist;Min assist Stand pivot transfers: Mod assist       General transfer comment: Incrased time and asssit to power up from teh recliner. pt able to slide R foot  towards BSC, unable to lift it from the floor. Min A fro sit<>stand teh second time from the [place] Overall balance assessment: Needs assistance Sitting-balance support: Feet supported Sitting balance-Leahy Scale: Fair     Standing balance support: Bilateral upper extremity supported Standing balance-Leahy Scale: Poor Standing balance comment: reliant on external support                           ADL either performed or assessed with clinical judgement   ADL Overall ADL's : Needs assistance/impaired Eating/Feeding: Independent;Sitting   Grooming: Set up;Sitting   Upper Body Bathing: Set up;Sitting   Lower Body Bathing: Moderate assistance;Sit to/from stand   Upper Body Dressing : Set up;Sitting   Lower Body Dressing: Maximal assistance;Sit to/from stand   Toilet Transfer: Moderate assistance;Stand-pivot;Ambulation;BSC Toilet Transfer Details (indicate cue type and reason): mod A for powering up into standing and steadying, mod A for balance during weigth shift with RW Toileting- Clothing Manipulation and Hygiene: Maximal assistance;Sit to/from stand Toileting - Clothing Manipulation Details (indicate cue type and reason): pt required max A for peri care after toileting due to BUE reliant on extrenal suport for standing balance     Functional mobility during ADLs: Moderate assistance;Rolling walker General ADL Comments: Pt with increased assist levels for lower body tasks due to poor standing balance and inabiliy to reach bilat feet     Vision   Vision Assessment?: No apparent visual deficits      Pertinent  Vitals/Pain Pain Assessment: Faces Faces Pain Scale: Hurts little more Pain Location: RLE Pain Descriptors / Indicators: Discomfort;Grimacing;Guarding Pain Intervention(s): Monitored during session     Hand Dominance Right   Extremity/Trunk Assessment Upper Extremity Assessment Upper Extremity Assessment: Generalized weakness   Lower  Extremity Assessment Lower Extremity Assessment: Defer to PT evaluation RLE Deficits / Details: Ace wrap to knee, edema. Active knee flexion to ~20 degrees   Cervical / Trunk Assessment Cervical / Trunk Assessment: Kyphotic   Communication Communication Communication: No difficulties   Cognition Arousal/Alertness: Awake/alert Behavior During Therapy: WFL for tasks assessed/performed Overall Cognitive Status: Within Functional Limits for tasks assessed          Exercises Exercises: General Lower Extremity General Exercises - Lower Extremity Ankle Circles/Pumps: Both;10 reps;Seated   Shoulder Instructions      Home Living Family/patient expects to be discharged to:: Private residence Living Arrangements: Alone Available Help at Discharge: Family;Available PRN/intermittently Type of Home: House Home Access: Stairs to enter [employer] of Steps: 2   Home Layout: One level     Bathroom Shower/Tub: Occupational psychologist: Standard Bathroom Accessibility: Yes How Accessible: Accessible via walker Home Equipment: Grab bars - tub/shower;Walker - 4 wheels;Cane - single Tax inspector - 2 wheels          Prior Functioning/Environment Level of Independence: Needs assistance  Gait / Transfers Assistance Needed: uses RW for household ambulation, cane for community ambulation ADL's / Homemaking Assistance Needed: daughter assists with grocery shopping; has cleaners. pt able to cook   Comments: Pt reports she has not fallen in the past 6 months, besides this last fall for this admission.        OT Problem List: Decreased strength;Decreased range of motion;Impaired balance (sitting and/or standing);Decreased safety awareness;Decreased knowledge of use of DME or AE;Decreased knowledge of precautions;Pain      OT Treatment/Interventions:      OT Goals(Current goals can be found in the care plan section) Acute Rehab OT Goals Patient Stated  Goal: prefers home vs rehab OT Goal Formulation: With patient Time For Goal Achievement: 09/11/20 Potential to Achieve Goals: Good ADL Goals Pt Will Perform Grooming: with supervision;standing Pt Will Perform Lower Body Bathing: with min guard assist;sit to/from stand Pt Will Perform Lower Body Dressing: with min assist;sit to/from stand Pt Will Transfer to Toilet: with min guard assist;bedside commode;ambulating Pt Will Perform Toileting - Clothing Manipulation and hygiene: with min guard assist;sit to/from stand Pt Will Perform Tub/Shower Transfer: with min guard assist;ambulating;shower seat;grab bars;rolling walker  OT Frequency: Min 2X/week   Barriers to D/C: Decreased caregiver support  pt lives alone. family trying to arrange 24/7 care          AM-PAC OT "6 Clicks" Daily Activity     Outcome Measure Help from another person eating meals?: None Help from another person taking care of personal grooming?: A Little Help from another person toileting, which includes using toliet, bedpan, or urinal?: A Lot Help from another person bathing (including washing, rinsing, drying)?: A Lot Help from another person to put on and taking off regular upper body clothing?: None Help from another person to put on and taking off regular lower body clothing?: A Lot 6 Click Score: 17   End of Session Equipment Utilized During Treatment: Gait belt;Rolling walker (BSC) Nurse Communication: Mobility status;Weight bearing status;Precautions  Activity Tolerance: Patient tolerated treatment well;Patient limited by pain Patient left: in chair;with call bell/phone within reach;with chair alarm set;with family/visitor present  OT  Visit Diagnosis: Unsteadiness on feet (R26.81);Other abnormalities of gait and mobility (R26.89);Muscle weakness (generalized) (M62.81);History of falling (Z91.81);Pain                Time: 1100-1130 OT Time Calculation (min): 30 min Charges:  OT General Charges $OT Visit: 1  Visit OT Evaluation $OT Eval Moderate Complexity: 1 Mod OT Treatments $Self Care/Home Management : 8-22 mins    Johnda Billiot A Kristion Holifield 08/28/2020, 11:47 AM

## 2020-08-28 NOTE — ED Notes (Signed)
Changed pt linens, blankets, gowns, and pt cleaned her own bottom.

## 2020-08-28 NOTE — Plan of Care (Signed)
  Problem: Clinical Measurements: Goal: Will remain free from infection Outcome: Progressing   Problem: Activity: Goal: Risk for activity intolerance will decrease Outcome: Progressing   Problem: Pain Managment: Goal: General experience of comfort will improve Outcome: Progressing   Problem: Safety: Goal: Ability to remain free from injury will improve Outcome: Progressing   

## 2020-08-28 NOTE — Progress Notes (Signed)
PROGRESS NOTE   Allison Norman  WJX:[id number] DOB: [birth year] DOA: 08/27/2020 PCP: [name], MD  Brief Narrative:  87 year old white female community dwelling lives alone P A. fib + PPM 07/22/2019 CHADS2 score >4 Eliquis, polymyalgia rheumatica/prednisone, HTN Very high functioning  Tripped while ambulating 8/22 large right knee laceration with persistent bleeding-- difficultyobtaining hemostasis initially-on-call orthopedics closed 10 cm laceration with 10 sutures and pressure dressing-Rx Tranxene Amick acid ibuprofen Tylenol oxycodone   Hospital-Problem based course  Acute fall with laceration  Hold Eliquis for 1 more day and then resume Suture removal at PCP office subsequently in about 10 to 14 days will need to be seen in about 1 week for lab work Acute blood loss anemia Nadir 7.9 rebounded on its own to 9.0 so some dilutional/artifact component Stop checking every 4 hourly AKI secondary to poor p.o. intake in a 87 year old Nausea No p.o. intake overnight or today and feeling very nauseous start Zofran ODT-- Start D5/0.45@100 /H Repeat labs a.m. Paroxysmal A. fib CHADS2 score >4 on Eliquis status post PPM Continue amiodarone 100 daily, Coreg 3.125 twice daily Polymyalgia rheumatica on chronic prednisone Continue prednisone 5 HTN Resuming today amlodipine 5  Would hold clonidine 3 times daily minoxidil 5 AM, two-point 5 PM for now, as well as losartan 100  DVT prophylaxis: scd Code Status: full Family Communication: Granddaughter at bedside-discussed plan of care in detail with her Disposition:  Status is: Observation  The patient will require care spanning > 2 midnights and should be moved to inpatient because: Persistent severe electrolyte disturbances, Ongoing active pain requiring inpatient pain management, Ongoing diagnostic testing needed not appropriate for outpatient work up, and Unsafe d/c plan  Dispo: The patient is from: Home               Anticipated d/c is to: Home in 24-48 depending on diet and mild azotemia              Patient currently is not medically stable to d/c.   Difficult to place patient No       Consultants:  none  Procedures: n  Antimicrobials: n    Subjective:  Awake alert  Very coherent Some pain in rLE  Not SOB--is also quite nauseous No cp fever  Objective: Vitals:   08/28/20 0215 08/28/20 0236 08/28/20 0819 08/28/20 1444  BP: 114/60 (!) 142/78 123/81 (!) 156/84  Pulse: 89 (!) 103 (!) 110 91  Resp: 18 17 16 16   Temp:  97.7 F (36.5 C) 98.6 F (37 C) 98.4 F (36.9 C)  TempSrc:  Oral Oral Oral  SpO2: 100% 100% 95% 93%  Weight:      Height:       No intake or output data in the 24 hours ending 08/28/20 1458 Filed Weights   08/27/20 1420  Weight: 63.5 kg    Examination:  Eomi ncata b looks a lot younger than stated age No ict no pallor Cta b no added sound RLE large pressure dressing on top  Data Reviewed: personally reviewed   CBC    Component Value Date/Time   WBC 10.0 08/28/2020 0525   RBC 2.82 (L) 08/28/2020 0525   HGB 9.0 (L) 08/28/2020 0922   HGB 11.7 07/05/2019 1048   HCT 28.9 (L) 08/28/2020 0922   HCT 35.9 07/05/2019 1048   PLT 183 08/28/2020 0525   PLT 239 07/05/2019 1048   MCV 91.5 08/28/2020 0525   MCV 91 07/05/2019 1048   MCH 29.1 08/28/2020 0525  MCHC 31.8 08/28/2020 0525   RDW 15.9 (H) 08/28/2020 0525   RDW 15.6 (H) 07/05/2019 1048   LYMPHSABS 2.1 08/27/2020 2204   LYMPHSABS 1.3 07/05/2019 1048   MONOABS 1.1 (H) 08/27/2020 2204   EOSABS 0.2 08/27/2020 2204   EOSABS 0.3 07/05/2019 1048   BASOSABS 0.1 08/27/2020 2204   BASOSABS 0.0 07/05/2019 1048   CMP Latest Ref Rng & Units 08/28/2020 02/17/2020 07/05/2019  Glucose 70 - 99 mg/dL 106(H) 92 93  BUN 8 - 23 mg/dL 42(H) 32 36  Creatinine 0.44 - 1.00 mg/dL 2.10(H) 1.79(H) 1.99(H)  Sodium 135 - 145 mmol/L 136 143 138  Potassium 3.5 - 5.1 mmol/L 4.6 5.0 4.7  Chloride 98 - 111 mmol/L 107 101 105   CO2 22 - 32 mmol/L 21(L) 21 25  Calcium 8.9 - 10.3 mg/dL 7.8(L) 9.5 8.7  Total Protein 6.5 - 8.1 g/dL 5.0(L) - -  Total Bilirubin 0.3 - 1.2 mg/dL 0.9 - -  Alkaline Phos 38 - 126 U/L 72 - -  AST 15 - 41 U/L 23 - -  ALT 0 - 44 U/L 18 - -     Radiology Studies: DG Knee 2 Views Right  Result Date: 08/27/2020 CLINICAL DATA:  History of fall EXAM: RIGHT KNEE - 1-2 VIEW COMPARISON:  None. FINDINGS: No acute fracture or dislocation. Status post total right knee replacement with no evidence of periprosthetic lucency or fracture. Cannot evaluate for pleural effusion due to overlying bandage. IMPRESSION: No acute fracture or dislocation. Electronically Signed   By: Yetta Glassman M.D.   On: 08/27/2020 16:28     Scheduled Meds:  amiodarone  100 mg Oral Daily   amLODipine  5 mg Oral QHS   [START ON 08/29/2020] apixaban  2.5 mg Oral BID   predniSONE  5 mg Oral QODAY   Continuous Infusions:  dextrose 5 % and 0.45% NaCl       LOS: 0 days   Time spent: [place], MD [facility] To contact the attending provider between 7A-7P or the covering provider during after hours 7P-7A, please log into the web site [email or web address] and access using universal [place] password for that web site. If you do not have the password, please call the hospital operator.  08/28/2020, 2:58 PM

## 2020-08-28 NOTE — Plan of Care (Signed)
  Problem: Clinical Measurements: Goal: Will remain free from infection Outcome: Progressing Goal: Diagnostic test results will improve Outcome: Progressing Goal: Cardiovascular complication will be avoided Outcome: Progressing   Problem: Activity: Goal: Risk for activity intolerance will decrease Outcome: Progressing   

## 2020-08-28 NOTE — ED Notes (Signed)
Placed pt on bed pan.

## 2020-08-28 NOTE — Evaluation (Signed)
Physical Therapy Evaluation Patient Details Name: Allison Norman MRN: [id number] DOB: [birth year] Today's Date: 08/28/2020   History of Present Illness  Pt is a 87 y.o. F who presents with acute blood loss anemia due to RLE laceration. Significant PMH: paroxysmal atrial fibrillation, HTN, polymyalgia rheumatica.  Clinical Impression  PTA, pt lives alone, uses walker vs cane for mobility and is independent with ADL's. Pt presents with decreased functional mobility secondary to RLE weakness, edema, pain. Pt requiring min assist for stand pivot bedside commode transfers using a walker. Able to take pivotal steps, however, limited weightbearing through RLE at this time. Discussed pt current mobility status with pt daughter via phone. Pt daughter unsure at this time if she will be able to provide necessary assist for pt. Provided pt with gait belt. Will continue to assess.     Follow Up Recommendations SNF (if dtr able to provide assist, then HHPT)    Equipment Recommendations  3in1 (PT);Wheelchair (measurements PT);Wheelchair cushion (measurements PT)    Recommendations for Other Services       Precautions / Restrictions Precautions Precautions: Fall Restrictions Weight Bearing Restrictions: No      Mobility  Bed Mobility Overal bed mobility: Needs Assistance Bed Mobility: Supine to Sit     Supine to sit: Min assist     General bed mobility comments: Assist for RLE off edge of bed and use of bed pad to scoot hips forward    Transfers Overall transfer level: Needs assistance Equipment used: Rolling walker (2 wheeled) Transfers: Sit to/from [facility] Sit to Stand: Min assist Stand pivot transfers: Min assist       General transfer comment: Increased time to rise, assist to boost up to standing position and pivoting to [facility] and then to recliner. Able to "shimmy," LLE; limited weightbearing through RLE. Cues for upright posture  Ambulation/Gait                 Stairs            Wheelchair Mobility    Modified Rankin (Stroke Patients Only)       Balance Overall balance assessment: Needs assistance Sitting-balance support: Feet supported Sitting balance-Leahy Scale: Fair     Standing balance support: Bilateral upper extremity supported Standing balance-Leahy Scale: Poor Standing balance comment: reliant on external support                             Pertinent Vitals/Pain Pain Assessment: Faces Faces Pain Scale: Hurts even more Pain Location: RLE Pain Descriptors / Indicators: Discomfort;Grimacing;Guarding Pain Intervention(s): Monitored during session    Home Living Family/patient expects to be discharged to:: Private residence Living Arrangements: Alone Available Help at Discharge: Family;Available PRN/intermittently Type of Home: House (townhouse) Home Access: Stairs to enter   [employer] of Steps: 2 (1 step onto porch and then 1 into door) Home Layout: One level Home Equipment: Grab bars - tub/shower;Walker - 4 wheels;Cane - single Tax inspector - 2 wheels      Prior Function Level of Independence: Needs assistance   Gait / Transfers Assistance Needed: uses RW for household ambulation, cane for community ambulation  ADL's / Homemaking Assistance Needed: daughter assists with grocery shopping; has cleaners. pt able to cook        Hand Dominance        Extremity/Trunk Assessment   Upper Extremity Assessment Upper Extremity Assessment: Defer to OT evaluation    Lower Extremity Assessment  Lower Extremity Assessment: RLE deficits/detail;Generalized weakness RLE Deficits / Details: Ace wrap to knee, edema. Active knee flexion to ~20 degrees    Cervical / Trunk Assessment Cervical / Trunk Assessment: Kyphotic  Communication   Communication: No difficulties  Cognition Arousal/Alertness: Awake/alert Behavior During Therapy: WFL for tasks  assessed/performed Overall Cognitive Status: Within Functional Limits for tasks assessed                                        General Comments      Exercises General Exercises - Lower Extremity Ankle Circles/Pumps: Both;10 reps;Seated   Assessment/Plan    PT Assessment Patient needs continued PT services  PT Problem List Decreased strength;Decreased activity tolerance;Decreased balance;Decreased range of motion;Decreased mobility;Pain       PT Treatment Interventions DME instruction;Gait training;Functional mobility training;Therapeutic activities;Balance training;Therapeutic exercise;Patient/family education    PT Goals (Current goals can be found in the Care Plan section)  Acute Rehab PT Goals Patient Stated Goal: prefers home vs rehab PT Goal Formulation: With patient Time For Goal Achievement: 09/11/20 Potential to Achieve Goals: Good    Frequency Min 3X/week   Barriers to discharge Decreased caregiver support      Co-evaluation               AM-PAC PT "6 Clicks" Mobility  Outcome Measure Help needed turning from your back to your side while in a flat bed without using bedrails?: A Little Help needed moving from lying on your back to sitting on the side of a flat bed without using bedrails?: A Little Help needed moving to and from a bed to a chair (including a wheelchair)?: A Little Help needed standing up from a chair using your arms (e.g., wheelchair or bedside chair)?: A Little Help needed to walk in hospital room?: A Lot Help needed climbing 3-5 steps with a railing? : Total 6 Click Score: 15    End of Session Equipment Utilized During Treatment: Gait belt Activity Tolerance: Patient tolerated treatment well Patient left: in chair;with call bell/phone within reach;with chair alarm set Nurse Communication: Mobility status PT Visit Diagnosis: Unsteadiness on feet (R26.81);Muscle weakness (generalized) (M62.81);History of falling  (Z91.81);Difficulty in walking, not elsewhere classified (R26.2);Pain Pain - Right/Left: Right Pain - part of body: Leg    Time: 8242-3536 PT Time Calculation (min) (ACUTE ONLY): 44 min   Charges:   PT Evaluation $PT Eval Moderate Complexity: 1 Mod PT Treatments $Therapeutic Activity: 23-37 mins        Wyona Almas, PT, DPT Acute Rehabilitation Services Pager [phone] Office [phone]   Deno Etienne 08/28/2020, 10:29 AM

## 2020-08-28 NOTE — ED Notes (Signed)
While assisting pt on bedpan, pt O2 started dropping to 87%. Pt stated her stomach was hurting and felt the need to have a BM. Adjusted pt nasal canula 4L and encouraged deep breaths.

## 2020-08-28 NOTE — TOC Progression Note (Signed)
Transition of Care [facility]) - Progression Note    Patient Details  Name: Allison Norman MRN: [id number] Date of Birth: 09/09/1924  Transition of Care [facility]) CM/SW Contact  Milinda Antis, Drain Phone Number: 08/28/2020, 4:03 PM  Clinical Narrative:    CSW called the patient's daughter to inquire about SNF vs HH and the family's ability to provide 24 hour support for the patient.  There was no answer.  CSW left a VM requesting a returned call.         Expected Discharge Plan and Services                                                 Social Determinants of Health (SDOH) Interventions    Readmission Risk Interventions No flowsheet data found.

## 2020-08-29 LAB — CBC WITH DIFFERENTIAL/PLATELET
Abs Immature Granulocytes: 0.1 10*3/uL — ABNORMAL HIGH (ref 0.00–0.07)
Basophils Absolute: 0.1 10*3/uL (ref 0.0–0.1)
Basophils Relative: 1 %
Eosinophils Absolute: 0.1 10*3/uL (ref 0.0–0.5)
Eosinophils Relative: 1 %
HCT: 24.9 % — ABNORMAL LOW (ref 36.0–46.0)
Hemoglobin: 8 g/dL — ABNORMAL LOW (ref 12.0–15.0)
Immature Granulocytes: 1 %
Lymphocytes Relative: 18 %
Lymphs Abs: 1.9 10*3/uL (ref 0.7–4.0)
MCH: 29.1 pg (ref 26.0–34.0)
MCHC: 32.1 g/dL (ref 30.0–36.0)
MCV: 90.5 fL (ref 80.0–100.0)
Monocytes Absolute: 1.8 10*3/uL — ABNORMAL HIGH (ref 0.1–1.0)
Monocytes Relative: 17 %
Neutro Abs: 6.9 10*3/uL (ref 1.7–7.7)
Neutrophils Relative %: 62 %
Platelets: 202 10*3/uL (ref 150–400)
RBC: 2.75 MIL/uL — ABNORMAL LOW (ref 3.87–5.11)
RDW: 16.3 % — ABNORMAL HIGH (ref 11.5–15.5)
WBC: 10.7 10*3/uL — ABNORMAL HIGH (ref 4.0–10.5)
nRBC: 0.2 % (ref 0.0–0.2)

## 2020-08-29 LAB — COMPREHENSIVE METABOLIC PANEL
ALT: 19 U/L (ref 0–44)
AST: 33 U/L (ref 15–41)
Albumin: 3 g/dL — ABNORMAL LOW (ref 3.5–5.0)
Alkaline Phosphatase: 66 U/L (ref 38–126)
Anion gap: 11 (ref 5–15)
BUN: 34 mg/dL — ABNORMAL HIGH (ref 8–23)
CO2: 20 mmol/L — ABNORMAL LOW (ref 22–32)
Calcium: 7.7 mg/dL — ABNORMAL LOW (ref 8.9–10.3)
Chloride: 105 mmol/L (ref 98–111)
Creatinine, Ser: 2.22 mg/dL — ABNORMAL HIGH (ref 0.44–1.00)
GFR, Estimated: 20 mL/min — ABNORMAL LOW (ref >60)
Glucose, Bld: 139 mg/dL — ABNORMAL HIGH (ref 70–99)
Potassium: 4.4 mmol/L (ref 3.5–5.1)
Sodium: 136 mmol/L (ref 135–145)
Total Bilirubin: 0.6 mg/dL (ref 0.3–1.2)
Total Protein: 5.3 g/dL — ABNORMAL LOW (ref 6.5–8.1)

## 2020-08-29 MED ORDER — PANTOPRAZOLE SODIUM 40 MG PO TBEC
40.0000 mg | DELAYED_RELEASE_TABLET | Freq: Two times a day (BID) | ORAL | Status: DC
  Administered 2020-08-29 – 2020-08-31 (×5): 40 mg via ORAL
  Filled 2020-08-29: qty 1
  Filled 2020-08-29: qty 2
  Filled 2020-08-29 (×2): qty 1
  Filled 2020-08-29: qty 2

## 2020-08-29 MED ORDER — SENNA 8.6 MG PO TABS
1.0000 | ORAL_TABLET | Freq: Every day | ORAL | Status: DC
  Administered 2020-08-29 – 2020-08-30 (×2): 8.6 mg via ORAL
  Filled 2020-08-29 (×2): qty 1

## 2020-08-29 MED ORDER — CLONIDINE HCL 0.2 MG PO TABS
0.2000 mg | ORAL_TABLET | Freq: Two times a day (BID) | ORAL | Status: DC
  Administered 2020-08-29 – 2020-08-31 (×4): 0.2 mg via ORAL
  Filled 2020-08-29 (×4): qty 1

## 2020-08-29 MED ORDER — OXYCODONE HCL 5 MG PO TABS
5.0000 mg | ORAL_TABLET | Freq: Once | ORAL | Status: AC
  Administered 2020-08-29: 5 mg via ORAL
  Filled 2020-08-29: qty 1

## 2020-08-29 MED ORDER — DIAZEPAM 2 MG PO TABS
2.0000 mg | ORAL_TABLET | Freq: Every evening | ORAL | Status: DC | PRN
  Administered 2020-08-30: 2 mg via ORAL
  Filled 2020-08-29: qty 1

## 2020-08-29 MED ORDER — TRAMADOL HCL 50 MG PO TABS
50.0000 mg | ORAL_TABLET | Freq: Four times a day (QID) | ORAL | Status: DC | PRN
  Administered 2020-08-29: 50 mg via ORAL
  Filled 2020-08-29: qty 1

## 2020-08-29 MED ORDER — BACITRACIN-NEOMYCIN-POLYMYXIN OINTMENT TUBE
TOPICAL_OINTMENT | Freq: Two times a day (BID) | CUTANEOUS | Status: DC
  Filled 2020-08-29: qty 14

## 2020-08-29 MED ORDER — CALCIUM CARBONATE ANTACID 500 MG PO CHEW: 1.0000 | CHEWABLE_TABLET | Freq: Two times a day (BID) | ORAL | Status: DC | PRN

## 2020-08-29 MED ORDER — CARVEDILOL 3.125 MG PO TABS
3.1250 mg | ORAL_TABLET | Freq: Two times a day (BID) | ORAL | Status: DC
  Administered 2020-08-29 – 2020-08-31 (×5): 3.125 mg via ORAL
  Filled 2020-08-29 (×4): qty 1

## 2020-08-29 NOTE — Consult Note (Signed)
Reason for Consult:Knee lac Referring Physician: Niel Hummer Time called: 0240 Time at bedside: [place] is an 87 y.o. female.  HPI: Allison Norman fell and suffered a knee laceration. There was trouble controlling bleeding 2/2 anticoagulation but was eventually controlled and sutured. Orthopedic surgery was consulted today for wound recommendations. She c/o localized pain to the area.  Past Medical History:  Diagnosis Date   Atrial fibrillation ([facility]) 2017   a. s/p DCCV in 10/2015  b. recurrent in 08/2016 --> rate-control pursued.    Cancer ([facility])    Breast   CKD (chronic kidney disease)    GCA (giant cell arteritis) (HCC)    Hordeolum internum left lower eyelid 06/28/2019   Hypertension    Macular degeneration    Osteoporosis    PMR (polymyalgia rheumatica) (HCC)    Resistant hypertension 03/15/2019   Shortness of breath 03/15/2019   Skin cancer     Past Surgical History:  Procedure Laterality Date   CARDIOVERSION N/A 10/18/2015   Procedure: CARDIOVERSION;  Surgeon: Jerline Pain, MD;  Location: [facility];  Service: Cardiovascular;  Laterality: N/A;   CARDIOVERSION N/A 02/20/2017   Procedure: CARDIOVERSION;  Surgeon: Pixie Casino, MD;  Location: [facility];  Service: Cardiovascular;  Laterality: N/A;   CARDIOVERSION N/A 06/18/2017   Procedure: CARDIOVERSION;  Surgeon: Sanda Klein, MD;  Location: [facility];  Service: Cardiovascular;  Laterality: N/A;   CARDIOVERSION N/A 12/21/2018   Procedure: CARDIOVERSION;  Surgeon: Thayer Headings, MD;  Location: [facility] ENDOSCOPY;  Service: Cardiovascular;  Laterality: N/A;   KNEE SURGERY  2013   MASTECTOMY  1998   left side   PACEMAKER IMPLANT N/A 07/22/2019   Procedure: PACEMAKER IMPLANT;  Surgeon: Evans Lance, MD;  Location: [facility];  Service: Cardiovascular;  Laterality: N/A;    Family History  Problem Relation Age of Onset   Stroke Mother    Kidney failure Father        died at 27   Heart disease  Sister        died at 57   Heart disease Sister        died at 35   Breast cancer Daughter     Social History:  reports that she quit smoking about 54 years ago. Her smoking use included cigarettes. She has never used smokeless tobacco. She reports that she does not drink alcohol and does not use drugs.  Allergies:  Allergies  Allergen Reactions   Codeine Itching   Penicillins Itching, Rash and Other (See Comments)    Has patient had a PCN reaction causing immediate rash, facial/tongue/throat swelling, SOB or lightheadedness with hypotension: Yes Has patient had a PCN reaction causing severe rash involving mucus membranes or skin necrosis: No Has patient had a PCN reaction that required hospitalization: No Has patient had a PCN reaction occurring within the last 10 years: No If all of the above answers are "NO", then may proceed with Cephalosporin use.   Doxazosin     Heavy feeling in chest, congestion, wheezing   Doxycycline Other (See Comments)    Cause chest tightness   Doxycycline Hyclate Other (See Comments)   Hydralazine     Felt bad and could hear pulse in head     Medications: I have reviewed the patient's current medications.  Results for orders placed or performed during the hospital encounter of 08/27/20 (from the past 48 hour(s))  CBC with Differential     Status: Abnormal  Collection Time: 08/27/20  3:38 PM  Result Value Ref Range   WBC 6.9 4.0 - 10.5 K/uL   RBC 3.84 (L) 3.87 - 5.11 MIL/uL   Hemoglobin 11.0 (L) 12.0 - 15.0 g/dL   HCT 35.7 (L) 36.0 - 46.0 %   MCV 93.0 80.0 - 100.0 fL   MCH 28.6 26.0 - 34.0 pg   MCHC 30.8 30.0 - 36.0 g/dL   RDW 16.2 (H) 11.5 - 15.5 %   Platelets 215 150 - 400 K/uL   nRBC 0.0 0.0 - 0.2 %   Neutrophils Relative % 63 %   Neutro Abs 4.3 1.7 - 7.7 K/uL   Lymphocytes Relative 21 %   Lymphs Abs 1.4 0.7 - 4.0 K/uL   Monocytes Relative 12 %   Monocytes Absolute 0.8 0.1 - 1.0 K/uL   Eosinophils Relative 3 %   Eosinophils Absolute  0.2 0.0 - 0.5 K/uL   Basophils Relative 1 %   Basophils Absolute 0.0 0.0 - 0.1 K/uL   Immature Granulocytes 0 %   Abs Immature Granulocytes 0.03 0.00 - 0.07 K/uL    Comment: Performed at [facility], [address] [place], [place] [address]  CBC with Differential     Status: Abnormal   Collection Time: 08/27/20 10:04 PM  Result Value Ref Range   WBC 8.3 4.0 - 10.5 K/uL   RBC 3.20 (L) 3.87 - 5.11 MIL/uL   Hemoglobin 9.2 (L) 12.0 - 15.0 g/dL   HCT 29.8 (L) 36.0 - 46.0 %   MCV 93.1 80.0 - 100.0 fL   MCH 28.8 26.0 - 34.0 pg   MCHC 30.9 30.0 - 36.0 g/dL   RDW 15.9 (H) 11.5 - 15.5 %   Platelets 200 150 - 400 K/uL   nRBC 0.0 0.0 - 0.2 %   Neutrophils Relative % 59 %   Neutro Abs 4.9 1.7 - 7.7 K/uL   Lymphocytes Relative 25 %   Lymphs Abs 2.1 0.7 - 4.0 K/uL   Monocytes Relative 13 %   Monocytes Absolute 1.1 (H) 0.1 - 1.0 K/uL   Eosinophils Relative 2 %   Eosinophils Absolute 0.2 0.0 - 0.5 K/uL   Basophils Relative 1 %   Basophils Absolute 0.1 0.0 - 0.1 K/uL   Immature Granulocytes 0 %   Abs Immature Granulocytes 0.03 0.00 - 0.07 K/uL    Comment: Performed at [facility], [place] [address] [place], [place] [address]  Type and screen [place]     Status: None   Collection Time: 08/27/20 11:38 PM  Result Value Ref Range   ABO/RH(D) O POS    Antibody Screen NEG    Sample Expiration      08/30/2020,2359 Performed at [facility], [place] [address], [place], [place] [address]   Resp Panel by RT-PCR (Flu A&B, Covid) Nasopharyngeal Swab     Status: None   Collection Time: 08/27/20 11:40 PM   Specimen: Nasopharyngeal Swab; Nasopharyngeal(NP) swabs in vial transport medium  Result Value Ref Range   SARS Coronavirus 2 by RT PCR NEGATIVE NEGATIVE    Comment: (NOTE) SARS-CoV-2 target nucleic acids are NOT DETECTED.  The SARS-CoV-2 RNA is generally detectable in upper respiratory specimens during the acute phase of infection. The lowest concentration  of SARS-CoV-2 viral copies this assay can detect is 138 copies/mL. A negative result does not preclude SARS-Cov-2 infection and should not be used as the sole basis for treatment or other patient management decisions.  A negative result may occur with  improper specimen collection/handling, submission of specimen other than nasopharyngeal swab, presence of viral mutation(s) within the areas targeted by this assay, and inadequate number of viral copies(<138 copies/mL). A negative result must be combined with clinical observations, patient history, and epidemiological information. The expected result is Negative.  Fact Sheet for Patients:  EntrepreneurPulse.com.au  Fact Sheet for Healthcare Providers:  IncredibleEmployment.be  This test is no t yet approved or cleared by the [place] FDA and  has been authorized for detection and/or diagnosis of SARS-CoV-2 by FDA under an Emergency Use Authorization (EUA). This EUA will remain  in effect (meaning this test can be used) for the duration of the COVID-19 declaration under Section 564(b)(1) of the Act, 21 U.S.C.section 360bbb-3(b)(1), unless the authorization is terminated  or revoked sooner.       Influenza A by PCR NEGATIVE NEGATIVE   Influenza B by PCR NEGATIVE NEGATIVE    Comment: (NOTE) The Xpert Xpress SARS-CoV-2/FLU/RSV plus assay is intended as an aid in the diagnosis of influenza from Nasopharyngeal swab specimens and should not be used as a sole basis for treatment. Nasal washings and aspirates are unacceptable for Xpert Xpress SARS-CoV-2/FLU/RSV testing.  Fact Sheet for Patients: EntrepreneurPulse.com.au  Fact Sheet for Healthcare Providers: IncredibleEmployment.be  This test is not yet approved or cleared by the [place] FDA and has been authorized for detection and/or diagnosis of SARS-CoV-2 by FDA under an Emergency Use Authorization  (EUA). This EUA will remain in effect (meaning this test can be used) for the duration of the COVID-19 declaration under Section 564(b)(1) of the Act, 21 U.S.C. section 360bbb-3(b)(1), unless the authorization is terminated or revoked.  Performed at [facility], [place] [address] [place], [place] [address]   Hemoglobin and hematocrit, blood     Status: Abnormal   Collection Time: 08/28/20 12:30 AM  Result Value Ref Range   Hemoglobin 7.9 (L) 12.0 - 15.0 g/dL   HCT 25.6 (L) 36.0 - 46.0 %    Comment: Performed at [facility], [place] [address] [place], [place] [address]  Magnesium     Status: None   Collection Time: 08/28/20  5:25 AM  Result Value Ref Range   Magnesium 2.3 1.7 - 2.4 mg/dL    Comment: Performed at [facility] [address], [place], [place] [address]  Comprehensive metabolic panel     Status: Abnormal   Collection Time: 08/28/20  5:25 AM  Result Value Ref Range   Sodium 136 135 - 145 mmol/L   Potassium 4.6 3.5 - 5.1 mmol/L   Chloride 107 98 - 111 mmol/L   CO2 21 (L) 22 - 32 mmol/L   Glucose, Bld 106 (H) 70 - 99 mg/dL    Comment: Glucose reference range applies only to samples taken after fasting for at least 8 hours.   BUN 42 (H) 8 - 23 mg/dL   Creatinine, Ser 2.10 (H) 0.44 - 1.00 mg/dL   Calcium 7.8 (L) 8.9 - 10.3 mg/dL   Total Protein 5.0 (L) 6.5 - 8.1 g/dL   Albumin 2.9 (L) 3.5 - 5.0 g/dL   AST 23 15 - 41 U/L   ALT 18 0 - 44 U/L   Alkaline Phosphatase 72 38 - 126 U/L   Total Bilirubin 0.9 0.3 - 1.2 mg/dL   GFR, Estimated 21 (L) >60 mL/min    Comment: (NOTE) Calculated using the CKD-EPI Creatinine Equation (2021)    Anion gap  8 5 - 15    Comment: Performed at [facility], [place] [address], [place], [place] [address]  CBC     Status: Abnormal   Collection Time: 08/28/20  5:25 AM  Result Value Ref Range   WBC 10.0 4.0 - 10.5 K/uL   RBC 2.82 (L) 3.87 - 5.11 MIL/uL   Hemoglobin 8.2 (L) 12.0 - 15.0 g/dL   HCT 25.8 (L) 36.0 - 46.0 %    MCV 91.5 80.0 - 100.0 fL   MCH 29.1 26.0 - 34.0 pg   MCHC 31.8 30.0 - 36.0 g/dL   RDW 15.9 (H) 11.5 - 15.5 %   Platelets 183 150 - 400 K/uL   nRBC 0.0 0.0 - 0.2 %    Comment: Performed at [facility], [place] [address], [place], [place] [address]  Protime-INR     Status: Abnormal   Collection Time: 08/28/20  6:34 AM  Result Value Ref Range   Prothrombin Time 18.4 (H) 11.4 - 15.2 seconds   INR 1.5 (H) 0.8 - 1.2    Comment: (NOTE) INR goal varies based on device and disease states. Performed at [facility], [place] [address] [place], [place] [address]   APTT     Status: None   Collection Time: 08/28/20  6:34 AM  Result Value Ref Range   aPTT 31 24 - 36 seconds    Comment: Performed at [facility] [address] [place], [place] [address]  Hemoglobin and hematocrit, blood     Status: Abnormal   Collection Time: 08/28/20  9:22 AM  Result Value Ref Range   Hemoglobin 9.0 (L) 12.0 - 15.0 g/dL   HCT 28.9 (L) 36.0 - 46.0 %    Comment: Performed at [facility], [place] [address] [place], [place] [address]  CBC with Differential/Platelet     Status: Abnormal   Collection Time: 08/29/20  3:42 AM  Result Value Ref Range   WBC 10.7 (H) 4.0 - 10.5 K/uL   RBC 2.75 (L) 3.87 - 5.11 MIL/uL   Hemoglobin 8.0 (L) 12.0 - 15.0 g/dL   HCT 24.9 (L) 36.0 - 46.0 %   MCV 90.5 80.0 - 100.0 fL   MCH 29.1 26.0 - 34.0 pg   MCHC 32.1 30.0 - 36.0 g/dL   RDW 16.3 (H) 11.5 - 15.5 %   Platelets 202 150 - 400 K/uL   nRBC 0.2 0.0 - 0.2 %   Neutrophils Relative % 62 %   Neutro Abs 6.9 1.7 - 7.7 K/uL   Lymphocytes Relative 18 %   Lymphs Abs 1.9 0.7 - 4.0 K/uL   Monocytes Relative 17 %   Monocytes Absolute 1.8 (H) 0.1 - 1.0 K/uL   Eosinophils Relative 1 %   Eosinophils Absolute 0.1 0.0 - 0.5 K/uL   Basophils Relative 1 %   Basophils Absolute 0.1 0.0 - 0.1 K/uL   Immature Granulocytes 1 %   Abs Immature Granulocytes 0.10 (H) 0.00 - 0.07 K/uL    Comment: Performed at [facility] [address], [place],  [address]  Comprehensive metabolic panel     Status: Abnormal   Collection Time: 08/29/20  4:20 AM  Result Value Ref Range   Sodium 136 135 - 145 mmol/L   Potassium 4.4 3.5 - 5.1 mmol/L   Chloride 105 98 - 111 mmol/L   CO2 20 (L) 22 - 32 mmol/L   Glucose, Bld 139 (H) 70 -  99 mg/dL    Comment: Glucose reference range applies only to samples taken after fasting for at least 8 hours.   BUN 34 (H) 8 - 23 mg/dL   Creatinine, Ser 2.22 (H) 0.44 - 1.00 mg/dL   Calcium 7.7 (L) 8.9 - 10.3 mg/dL   Total Protein 5.3 (L) 6.5 - 8.1 g/dL   Albumin 3.0 (L) 3.5 - 5.0 g/dL   AST 33 15 - 41 U/L   ALT 19 0 - 44 U/L   Alkaline Phosphatase 66 38 - 126 U/L   Total Bilirubin 0.6 0.3 - 1.2 mg/dL   GFR, Estimated 20 (L) >60 mL/min    Comment: (NOTE) Calculated using the CKD-EPI Creatinine Equation (2021)    Anion gap 11 5 - 15    Comment: Performed at [facility], [place] [address] [place], [place] [address]    DG Knee 2 Views Right  Result Date: 08/27/2020 CLINICAL DATA:  History of fall EXAM: RIGHT KNEE - 1-2 VIEW COMPARISON:  None. FINDINGS: No acute fracture or dislocation. Status post total right knee replacement with no evidence of periprosthetic lucency or fracture. Cannot evaluate for pleural effusion due to overlying bandage. IMPRESSION: No acute fracture or dislocation. Electronically Signed   By: Yetta Glassman M.D.   On: 08/27/2020 16:28    Review of Systems  HENT:  Negative for ear discharge, ear pain, hearing loss and tinnitus.   Eyes:  Negative for photophobia and pain.  Respiratory:  Negative for cough and shortness of breath.   Cardiovascular:  Negative for chest pain.  Gastrointestinal:  Negative for abdominal pain, nausea and vomiting.  Genitourinary:  Negative for dysuria, flank pain, frequency and urgency.  Musculoskeletal:  Positive for arthralgias (Right knee). Negative for back pain, myalgias and neck pain.  Neurological:  Negative  for dizziness and headaches.  Hematological:  Does not bruise/bleed easily.  Psychiatric/Behavioral:  The patient is not nervous/anxious.   Blood pressure (!) 157/81, pulse (!) 106, temperature 98.7 F (37.1 C), temperature source Oral, resp. rate 16, height 5' 5.5" (1.664 m), weight 63.5 kg, SpO2 92 %. Physical Exam Constitutional:      General: She is not in acute distress.    Appearance: She is well-developed. She is not diaphoretic.  HENT:     Head: Normocephalic and atraumatic.  Eyes:     General: No scleral icterus.       Right eye: No discharge.        Left eye: No discharge.     Conjunctiva/sclera: Conjunctivae normal.  Cardiovascular:     Rate and Rhythm: Normal rate and regular rhythm.  Pulmonary:     Effort: Pulmonary effort is normal. No respiratory distress.  Musculoskeletal:     Cervical back: Normal range of motion.     Comments: LLE Sutured laceration across knee, no bleeding or discharge, surrounding ecchymosis. no rash  Mod TTP  No knee or ankle effusion  Sens DPN, SPN, TN intact  Motor EHL, ext, flex, evers 5/5  2+ edema distal to laceration  Skin:    General: Skin is warm and dry.  Neurological:     Mental Status: She is alert.  Psychiatric:        Mood and Affect: Mood normal.        Behavior: Behavior normal.    Assessment/Plan: Right knee lac -- Feel this will do well. Local wound care with daily washing and dry dressing. No soaking. No need for orthopedic follow up.  Lisette Abu, PA-C Orthopedic Surgery [phone] 08/29/2020, 2:02 PM

## 2020-08-29 NOTE — Consult Note (Signed)
[facility] Nurse Consult Note: Patient receiving care in [facility]. I spoke with primary RN, Eduard Clos, in completing this consult Reason for Consult: "knee wound, sp suture" entered by Dr. Rudolpho Sevin. Wound type: 10 cm laceration trauma injury requiring sutures in the ED and admission to inpatient status due to extensive blood loss.  Patient is on Eliquis for A Fib. There is already an order for twice daily application of neosporin ointment.  The RN reports the MD had her remove the pressure dressing, and the wound is not actively bleeding at this time. Also, there is an Orthopedic surgery consult in the chart for the same wound.  I will defer care of the area to Orthopedic surgery services.  If a dressing is required to the area, place something non-adherent, such as a Vaseline guaze and secure with kerlix.  [facility] nurse will not follow at this time.  Please re-consult the [facility] team if needed.  Val Riles, RN, MSN, CWOCN, CNS-BC, pager [phone]

## 2020-08-29 NOTE — Plan of Care (Signed)

## 2020-08-29 NOTE — Progress Notes (Signed)
PROGRESS NOTE    Allison Norman  QIW:[id number] DOB: Apr 20, 1924 DOA: 08/27/2020 PCP: [name], MD   Brief Narrative: 87 year old with past medical history significant for A. fib and PPM 07/22/2019, on Eliquis, polymyalgia rheumatica on prednisone, hypertension, very high functioning who tripped while ambulating on 8/22  had a large right knee laceration with persistent bleeding.  Difficult obtaining hemostasis.  ED physician close laceration with stitches, pressure dressing was placed.     Assessment & Plan:   Principal Problem:   Acute blood loss anemia Active Problems:   Essential hypertension   Polymyalgia rheumatica (HCC)   Persistent atrial fibrillation (HCC)   Laceration of right lower extremity   CKD (chronic kidney disease), stage IV (HCC)  1-Acute fall with laceration of the right knee: -Patient had pressure dressing in place, she was complaining of worsening right leg swelling and dressing was very tight.  I remove the dressing.  Plan to proceed with Neosporin twice daily, and dressing. -Orthopedic will follow-up on wound today. -Patient received morning dose of Eliquis, will plan to hold night dose. -tramadol PRN for pain.   2-Acute blood loss anemia: Secondary to right knee laceration: Hemoglobin on admission at 11 and has decreased to 8.  Plan to repeat CBC tomorrow Hold Eliquis tonight Repeat Hb this afternoon if decreased plan to transfuse 1 unit.  3-AKI on CKD III B.  Cr baseline 1.7--1.9 Worsening function related to hypoperfusion.  Continue with IV fluids.  Repeat kidney function tomorrow.   Paroxysmal A. fib: Status post pacemaker Continue with amiodarone and Coreg  Polymyalgia rheumatica: Continue with chronic prednisone  Hypertension: Continue with amlodipine and resume Coreg Holding clonidine  Estimated body mass index is 22.94 kg/m as calculated from the following:   Height as of this encounter: 5' 5.5" (1.664 m).   Weight as of this  encounter: 63.5 kg.   DVT prophylaxis: hold eliquis today Code Status: Full code Family Communication: Daughter at bedside.  Disposition Plan:  Status is: Inpatient  Remains inpatient appropriate because:IV treatments appropriate due to intensity of illness or inability to take PO  Dispo: The patient is from: Home              Anticipated d/c is to: Home              Patient currently is not medically stable to d/c.   Difficult to place patient No        Consultants:  Ortho  Procedures:    Antimicrobials:    Subjective: She report pain right knee.  Also report swelling of right leg, dressing is very tight.  Having hiccups/ and nausea. Not eating.   Objective: Vitals:   08/28/20 0819 08/28/20 1444 08/28/20 1951 08/29/20 0829  BP: 123/81 (!) 156/84 (!) 152/66 (!) 157/81  Pulse: (!) 110 91 (!) 103 (!) 106  Resp: 16 16 18 16   Temp: 98.6 F (37 C) 98.4 F (36.9 C) 98.4 F (36.9 C) 98.7 F (37.1 C)  TempSrc: Oral Oral Oral Oral  SpO2: 95% 93% 94% 92%  Weight:      Height:        Intake/Output Summary (Last 24 hours) at 08/29/2020 1356 Last data filed at 08/29/2020 4174 Gross per 24 hour  Intake 1341.26 ml  Output 201 ml  Net 1140.26 ml   Filed Weights   08/27/20 1420  Weight: 63.5 kg    Examination:  General exam: Appears calm and comfortable  Respiratory system: Clear to auscultation. Respiratory  effort normal. Cardiovascular system: S1 & S2 heard, RRR.  Gastrointestinal system: Abdomen is nondistended, soft and nontender. No organomegaly or masses felt. Normal bowel sounds heard. Central nervous system: Alert and oriented. No focal neurological deficits. Extremities: right knee with suture, some partial skin has come off. Not actively bleeding. Swelling rigtht leg.   Data Reviewed: I have personally reviewed following labs and imaging studies  CBC: Recent Labs  Lab 08/27/20 1538 08/27/20 2204 08/28/20 0030 08/28/20 0525 08/28/20 0922  08/29/20 0342  WBC 6.9 8.3  --  10.0  --  10.7*  NEUTROABS 4.3 4.9  --   --   --  6.9  HGB 11.0* 9.2* 7.9* 8.2* 9.0* 8.0*  HCT 35.7* 29.8* 25.6* 25.8* 28.9* 24.9*  MCV 93.0 93.1  --  91.5  --  90.5  PLT 215 200  --  183  --  673   Basic Metabolic Panel: Recent Labs  Lab 08/28/20 0525 08/29/20 0420  NA 136 136  K 4.6 4.4  CL 107 105  CO2 21* 20*  GLUCOSE 106* 139*  BUN 42* 34*  CREATININE 2.10* 2.22*  CALCIUM 7.8* 7.7*  MG 2.3  --    GFR: Estimated Creatinine Clearance: 14.2 mL/min (A) (by C-G formula based on SCr of 2.22 mg/dL (H)). Liver Function Tests: Recent Labs  Lab 08/28/20 0525 08/29/20 0420  AST 23 33  ALT 18 19  ALKPHOS 72 66  BILITOT 0.9 0.6  PROT 5.0* 5.3*  ALBUMIN 2.9* 3.0*   No results for input(s): LIPASE, AMYLASE in the last 168 hours. No results for input(s): AMMONIA in the last 168 hours. Coagulation Profile: Recent Labs  Lab 08/28/20 0634  INR 1.5*   Cardiac Enzymes: No results for input(s): CKTOTAL, CKMB, CKMBINDEX, TROPONINI in the last 168 hours. BNP (last 3 results) No results for input(s): PROBNP in the last 8760 hours. HbA1C: No results for input(s): HGBA1C in the last 72 hours. CBG: No results for input(s): GLUCAP in the last 168 hours. Lipid Profile: No results for input(s): CHOL, HDL, LDLCALC, TRIG, CHOLHDL, LDLDIRECT in the last 72 hours. Thyroid Function Tests: No results for input(s): TSH, T4TOTAL, FREET4, T3FREE, THYROIDAB in the last 72 hours. Anemia Panel: No results for input(s): VITAMINB12, FOLATE, FERRITIN, TIBC, IRON, RETICCTPCT in the last 72 hours. Sepsis Labs: No results for input(s): PROCALCITON, LATICACIDVEN in the last 168 hours.  Recent Results (from the past 240 hour(s))  Resp Panel by RT-PCR (Flu A&B, Covid) Nasopharyngeal Swab     Status: None   Collection Time: 08/27/20 11:40 PM   Specimen: Nasopharyngeal Swab; Nasopharyngeal(NP) swabs in vial transport medium  Result Value Ref Range Status   SARS  Coronavirus 2 by RT PCR NEGATIVE NEGATIVE Final    Comment: (NOTE) SARS-CoV-2 target nucleic acids are NOT DETECTED.  The SARS-CoV-2 RNA is generally detectable in upper respiratory specimens during the acute phase of infection. The lowest concentration of SARS-CoV-2 viral copies this assay can detect is 138 copies/mL. A negative result does not preclude SARS-Cov-2 infection and should not be used as the sole basis for treatment or other patient management decisions. A negative result may occur with  improper specimen collection/handling, submission of specimen other than nasopharyngeal swab, presence of viral mutation(s) within the areas targeted by this assay, and inadequate number of viral copies(<138 copies/mL). A negative result must be combined with clinical observations, patient history, and epidemiological information. The expected result is Negative.  Fact Sheet for Patients:  EntrepreneurPulse.com.au  Fact Sheet for  Healthcare Providers:  IncredibleEmployment.be  This test is no t yet approved or cleared by the [place] and  has been authorized for detection and/or diagnosis of SARS-CoV-2 by FDA under an Emergency Use Authorization (EUA). This EUA will remain  in effect (meaning this test can be used) for the duration of the COVID-19 declaration under Section 564(b)(1) of the Act, 21 U.S.C.section 360bbb-3(b)(1), unless the authorization is terminated  or revoked sooner.       Influenza A by PCR NEGATIVE NEGATIVE Final   Influenza B by PCR NEGATIVE NEGATIVE Final    Comment: (NOTE) The Xpert Xpress SARS-CoV-2/FLU/RSV plus assay is intended as an aid in the diagnosis of influenza from Nasopharyngeal swab specimens and should not be used as a sole basis for treatment. Nasal washings and aspirates are unacceptable for Xpert Xpress SARS-CoV-2/FLU/RSV testing.  Fact Sheet for  Patients: EntrepreneurPulse.com.au  Fact Sheet for Healthcare Providers: IncredibleEmployment.be  This test is not yet approved or cleared by the [place] FDA and has been authorized for detection and/or diagnosis of SARS-CoV-2 by FDA under an Emergency Use Authorization (EUA). This EUA will remain in effect (meaning this test can be used) for the duration of the COVID-19 declaration under Section 564(b)(1) of the Act, 21 U.S.C. section 360bbb-3(b)(1), unless the authorization is terminated or revoked.  Performed at [facility], [place] [address] [place], [place] [address]          Radiology Studies: DG Knee 2 Views Right  Result Date: 08/27/2020 CLINICAL DATA:  History of fall EXAM: RIGHT KNEE - 1-2 VIEW COMPARISON:  None. FINDINGS: No acute fracture or dislocation. Status post total right knee replacement with no evidence of periprosthetic lucency or fracture. Cannot evaluate for pleural effusion due to overlying bandage. IMPRESSION: No acute fracture or dislocation. Electronically Signed   By: Yetta Glassman M.D.   On: 08/27/2020 16:28        Scheduled Meds:  amiodarone  100 mg Oral Daily   amLODipine  5 mg Oral QHS   carvedilol  3.125 mg Oral BID WC   neomycin-bacitracin-polymyxin   Topical BID   pantoprazole  40 mg Oral BID   predniSONE  5 mg Oral QODAY   Continuous Infusions:  dextrose 5 % and 0.45% NaCl 100 mL/hr at 08/29/20 1228     LOS: 1 day    Time spent: 35 minutes.     Elmarie Shiley, MD [facility]   If 7PM-7AM, please contact night-coverage [email or web address]  08/29/2020, 1:56 PM

## 2020-08-29 NOTE — Progress Notes (Addendum)
0106 Pt complains of pain and spasms in right leg. Sharion Dove. [place] notified. Pt refused ice pack. No PRN. Awaiting orders. RN will continue to monitor patient.   0200 Orders received.

## 2020-08-29 NOTE — TOC Initial Note (Addendum)
Transition of Care [facility]) - Initial/Assessment Note    Patient Details  Name: Allison Norman MRN: [id number] Date of Birth: 06-19-1924  [facility]) CM/SW Contact:    Sharin Mons, RN Phone Number: 08/29/2020, 1:01 PM  Clinical Narrative:                 NCM spoke with daughter regarding TOC needs for pt @ d/c. Daughter states pt will not d.c to a SNF. Pt with 24/7 aide services provided when home.      TOC team will continue to monitor and assist with needs  Expected Discharge Plan: [place] (declined SNF placement) Barriers to Discharge: Continued Medical Work up   Patient Goals and CMS Choice        Expected Discharge Plan and Services Expected Discharge Plan: [place] (declined SNF placement)                                              Prior Living Arrangements/Services     Patient language and need for interpreter reviewed:: Yes Do you feel safe going back to the place where you live?: Yes      Need for Family Participation in Patient Care: Yes (Comment) Care giver support system in place?: Yes (comment)   Criminal Activity/Legal Involvement Pertinent to Current Situation/Hospitalization: No - Comment as needed  Activities of Daily Living Home Assistive Devices/Equipment: Walker (specify type) ADL Screening (condition at time of admission) Patient's cognitive ability adequate to safely complete daily activities?: Yes Is the patient deaf or have difficulty hearing?: No Does the patient have difficulty seeing, even when wearing glasses/contacts?: No Does the patient have difficulty concentrating, remembering, or making decisions?: No Patient able to express need for assistance with ADLs?: Yes Does the patient have difficulty dressing or bathing?: Yes Independently performs ADLs?: No Communication: Independent Dressing (OT): Independent Grooming: Independent Feeding: Independent Bathing:  Needs assistance Toileting: Needs assistance In/Out Bed: Needs assistance Does the patient have difficulty walking or climbing stairs?: Yes Weakness of Legs: None Weakness of Arms/Hands: None  Permission Sought/Granted                  Emotional Assessment   Attitude/Demeanor/Rapport: Gracious Affect (typically observed): Accepting Orientation: : Oriented to Self, Oriented to Place, Oriented to  Time, Oriented to Situation Alcohol / Substance Use: Not Applicable Psych Involvement: No (comment)  Admission diagnosis:  Acute blood loss anemia [D62] Acute pain of right knee [M25.561] Patient Active Problem List   Diagnosis Date Noted   Laceration of right lower extremity 08/28/2020   CKD (chronic kidney disease), stage IV (HCC)    Acute blood loss anemia 08/27/2020   Advanced nonexudative age-related macular degeneration of right eye without subfoveal involvement 03/15/2020   Pacemaker 11/01/2019   Tachycardia-bradycardia syndrome ([place]) 08/05/2019   Bradycardia 07/05/2019   Meibomian blepharitis, left 06/28/2019   Exudative age-related macular degeneration of right eye with active choroidal neovascularization ([place]) 05/03/2019   Cystoid macular edema of right eye 05/03/2019   Advanced nonexudative age-related macular degeneration of left eye with subfoveal involvement 05/03/2019   Resistant hypertension 03/15/2019   Shortness of breath 03/15/2019   Edema leg 01/19/2019   Fusion beats 08/03/2017   Atypical atrial flutter (HCC)    Persistent atrial fibrillation ([place]) 08/19/2016   On anticoagulant therapy 07/10/2016  Renal insufficiency 12/07/2015   PAF (paroxysmal atrial fibrillation) (HCC)    Atrial fibrillation with RVR ([place]) 08/31/2015   CRI (chronic renal insufficiency), stage 4 (severe) ([employer]) 08/31/2015   Essential hypertension 07/16/2009   TEMPORAL ARTERITIS 07/16/2009   Polymyalgia rheumatica ([place]) 07/16/2009   MRSA 07/02/2009   PYOGENIC ARTHRITIS, LOWER LEG  07/02/2009   PCP:  [name], MD Pharmacy:   [place], [place] [address] Phone: [phone] Fax: [phone]     Social Determinants of Health (SDOH) Interventions    Readmission Risk Interventions No flowsheet data found.

## 2020-08-29 NOTE — Progress Notes (Signed)
Pt's dressing was assessed, dressing change done with MD at bedside. No signs of active bleeding.

## 2020-08-29 NOTE — Progress Notes (Signed)
Occupational Therapy Treatment Patient Details Name: Allison Norman MRN: [id number] DOB: 1925-01-03 Today's Date: 08/29/2020    History of present illness Pt is a 87 y.o. F who presents with acute blood loss anemia due to RLE laceration. Significant PMH: paroxysmal atrial fibrillation, HTN, polymyalgia rheumatica.   OT comments  Allison Norman is progressing well, received on [facility] from NT. Pt completed sit<>stand and short ambulation with RW given min A overall and verbal cues foe safety. Pericare completed in standing with max A. Pt continues to demonstrate poor standing balance and tolerance, and reports fear of weight bearing through her RLE as she does not want to cause it to bleed which increases her fall risk. Pt continues to benefit from OT acutely. D/c plan remains appropriate.    Follow Up Recommendations  SNF;Home health OT;Supervision/Assistance - 24 hour (SNF vs HHOT, dependant on 24/7 care at home upon discharge)    Equipment Recommendations  3 in 1 bedside commode       Precautions / Restrictions Precautions Precautions: Fall Restrictions Weight Bearing Restrictions: No       Mobility Bed Mobility Overal bed mobility: Needs Assistance             General bed mobility comments: pt recieved on BSC and returned to chair    Transfers Overall transfer level: Needs assistance Equipment used: Rolling walker (2 wheeled) (RW) Transfers: Sit to/from Stand Sit to Stand: Min assist         General transfer comment: incrased time and effort for sit<>stand, min A for boosting into standing. Pt required vc throguhout for sequencing and safety    Balance Overall balance assessment: Needs assistance Sitting-balance support: Feet supported Sitting balance-Leahy Scale: Fair     Standing balance support: Bilateral upper extremity supported Standing balance-Leahy Scale: Poor             ADL either performed or assessed with clinical judgement   ADL     Toilet  Transfer: Minimal assistance;Stand-pivot;RW Toilet Transfer Details (indicate cue type and reason): min A to power up into standing Toileting- Clothing Manipulation and Hygiene: Maximal assistance;Sit to/from stand Toileting - Clothing Manipulation Details (indicate cue type and reason): pt able to minimally wipe peri area in sitting however therapist had to complete rear peri care in standing for thoroughness     Functional mobility during ADLs: Minimal assistance;Rolling walker General ADL Comments: BSC transfer this session, pt received on BSC.      Cognition Arousal/Alertness: Awake/alert Behavior During Therapy: WFL for tasks assessed/performed Overall Cognitive Status: Within Functional Limits for tasks assessed                        General Comments VSS on RA. Pt's great granddaughter present for session    Pertinent Vitals/ Pain       Pain Assessment: Faces Faces Pain Scale: Hurts a little bit Pain Location: RLE Pain Descriptors / Indicators: Discomfort;Grimacing;Guarding Pain Intervention(s): Limited activity within patient's tolerance;Monitored during session   Frequency  Min 2X/week        Progress Toward Goals  OT Goals(current goals can now be found in the care plan section)  Progress towards OT goals: Progressing toward goals  Acute Rehab OT Goals Patient Stated Goal: prefers home vs rehab OT Goal Formulation: With patient Time For Goal Achievement: 09/11/20 Potential to Achieve Goals: Good ADL Goals Pt Will Perform Grooming: with supervision;standing Pt Will Perform Lower Body Bathing: with min guard assist;sit to/from stand Pt Will  Perform Lower Body Dressing: with min assist;sit to/from stand Pt Will Transfer to Toilet: with min guard assist;bedside commode;ambulating Pt Will Perform Toileting - Clothing Manipulation and hygiene: with min guard assist;sit to/from stand Pt Will Perform Tub/Shower Transfer: with min guard  assist;ambulating;shower seat;grab bars;rolling walker  Plan Discharge plan remains appropriate;Discharge plan needs to be updated       AM-PAC OT "6 Clicks" Daily Activity     Outcome Measure   Help from another person eating meals?: None Help from another person taking care of personal grooming?: A Little Help from another person toileting, which includes using toliet, bedpan, or urinal?: A Lot Help from another person bathing (including washing, rinsing, drying)?: A Lot Help from another person to put on and taking off regular upper body clothing?: None Help from another person to put on and taking off regular lower body clothing?: A Lot 6 Click Score: 17    End of Session Equipment Utilized During Treatment: Gait belt;Rolling walker (RW)  OT Visit Diagnosis: Unsteadiness on feet (R26.81);Other abnormalities of gait and mobility (R26.89);Muscle weakness (generalized) (M62.81);History of falling (Z91.81);Pain   Activity Tolerance Patient tolerated treatment well;Patient limited by pain   Patient Left in chair;with call bell/phone within reach;with chair alarm set;with family/visitor present   Nurse Communication Mobility status;Weight bearing status;Precautions        Time: 0102-7253 OT Time Calculation (min): 16 min  Charges: OT General Charges $OT Visit: 1 Visit OT Treatments $Self Care/Home Management : 8-22 mins    Sivan Cuello A Hilary Milks 08/29/2020, 3:32 PM

## 2020-08-29 NOTE — Progress Notes (Signed)
Physical Therapy Treatment Patient Details Name: Allison Norman MRN: [id number] DOB: 1924/11/17 Today's Date: 08/29/2020    History of Present Illness Pt is a 87 y.o. F who presents with acute blood loss anemia due to RLE laceration. Significant PMH: paroxysmal atrial fibrillation, HTN, polymyalgia rheumatica.    PT Comments    Pt seated in recliner.  Performed transfer training from recliner x2 and BSC x 1.  Pt performed short bouts of gt training in room with RW and min assistance.  Posterior lean noted.  She is showing improvement but continues to benefit from snf placement at this time.     Follow Up Recommendations  SNF     Equipment Recommendations       Recommendations for Other Services       Precautions / Restrictions Precautions Precautions: Fall Restrictions Weight Bearing Restrictions: No    Mobility  Bed Mobility Overal bed mobility: Needs Assistance Bed Mobility: Sit to Supine       Sit to supine: Min assist   General bed mobility comments: Min assistance to lift B LEs back to bed against gravity.    Transfers Overall transfer level: Needs assistance Equipment used: Rolling walker (2 wheeled) Transfers: Sit to/from Stand Sit to Stand: Min assist         General transfer comment: Cues for hand placement to and from commode.  Pt required min assistance to boost into standing.  Ambulation/Gait Ambulation/Gait assistance: Min assist Gait Distance (Feet): 4 Feet (x2 - followed by 15 ft gt trial + 6 ft gt trial.) Assistive device: Rolling walker (2 wheeled) Gait Pattern/deviations: Step-to pattern;Decreased stride length;Trunk flexed;Shuffle     General Gait Details: Performed short bouts of gt training in room.  Pt required assistance to maintain balance and to turn and back to seated surface.   Stairs             Wheelchair Mobility    Modified Rankin (Stroke Patients Only)       Balance Overall balance assessment: Needs  assistance Sitting-balance support: Feet supported Sitting balance-Leahy Scale: Fair     Standing balance support: Bilateral upper extremity supported Standing balance-Leahy Scale: Poor                              Cognition Arousal/Alertness: Awake/alert Behavior During Therapy: WFL for tasks assessed/performed Overall Cognitive Status: Within Functional Limits for tasks assessed                                        Exercises      General Comments General comments (skin integrity, edema, etc.): VSS on RA. Pt's great granddaughter present for session      Pertinent Vitals/Pain Pain Assessment: Faces Faces Pain Scale: Hurts a little bit Pain Location: RLE Pain Descriptors / Indicators: Discomfort;Grimacing;Guarding Pain Intervention(s): Monitored during session;Repositioned    Home Living                      Prior Function            PT Goals (current goals can now be found in the care plan section) Acute Rehab PT Goals Patient Stated Goal: prefers home vs rehab Potential to Achieve Goals: Good Progress towards PT goals: Progressing toward goals    Frequency    Min 3X/week  PT Plan Current plan remains appropriate    Co-evaluation              AM-PAC PT "6 Clicks" Mobility   Outcome Measure  Help needed turning from your back to your side while in a flat bed without using bedrails?: A Little Help needed moving from lying on your back to sitting on the side of a flat bed without using bedrails?: A Little Help needed moving to and from a bed to a chair (including a wheelchair)?: A Little Help needed standing up from a chair using your arms (e.g., wheelchair or bedside chair)?: A Little Help needed to walk in hospital room?: A Little Help needed climbing 3-5 steps with a railing? : A Little 6 Click Score: 18    End of Session Equipment Utilized During Treatment: Gait belt Activity Tolerance: Patient  tolerated treatment well Patient left: in chair;with call bell/phone within reach;with chair alarm set Nurse Communication: Mobility status PT Visit Diagnosis: Unsteadiness on feet (R26.81);Muscle weakness (generalized) (M62.81);History of falling (Z91.81);Difficulty in walking, not elsewhere classified (R26.2);Pain Pain - Right/Left: Right Pain - part of body: Leg     Time: 8295-6213 PT Time Calculation (min) (ACUTE ONLY): 40 min  Charges:  $Gait Training: 8-22 mins $Therapeutic Activity: 23-37 mins                     Erasmo Leventhal , PTA Acute Rehabilitation Services Pager [phone] Office [phone]    Allison Norman 08/29/2020, 5:09 PM

## 2020-08-29 NOTE — TOC Progression Note (Signed)
Transition of Care [facility]) - Progression Note    Patient Details  Name: Allison Norman MRN: [id number] Date of Birth: June 27, 1924  Transition of [facility]) CM/SW Contact  Milinda Antis, LCSWA Phone Number: 08/29/2020, 2:12 PM  Clinical Narrative:    CSW spoke with the patient's daughter Allison Norman in reference to d/c planning.  [name] informed CSW that the family will now have 24 hour care for the patient so the patient will not need SNF placement.  Four women (who assisted the family when the patient's father was alive) have agreed to take turns staying with the patient.  Per the daughter the patient does not have needed DME and will need the 3n1 and wheelchair prior to d/c.     Expected Discharge Plan: [place] (declined SNF placement) Barriers to Discharge: Continued Medical Work up  Expected Discharge Plan and Services Expected Discharge Plan: [place] (declined SNF placement)                                               Social Determinants of Health (SDOH) Interventions    Readmission Risk Interventions No flowsheet data found.

## 2020-08-30 ENCOUNTER — Inpatient Hospital Stay (HOSPITAL_COMMUNITY): Payer: Medicare Other

## 2020-08-30 LAB — CBC
HCT: 25.3 % — ABNORMAL LOW (ref 36.0–46.0)
Hemoglobin: 7.9 g/dL — ABNORMAL LOW (ref 12.0–15.0)
MCH: 29.2 pg (ref 26.0–34.0)
MCHC: 31.2 g/dL (ref 30.0–36.0)
MCV: 93.4 fL (ref 80.0–100.0)
Platelets: 175 10*3/uL (ref 150–400)
RBC: 2.71 MIL/uL — ABNORMAL LOW (ref 3.87–5.11)
RDW: 16.2 % — ABNORMAL HIGH (ref 11.5–15.5)
WBC: 9.8 10*3/uL (ref 4.0–10.5)
nRBC: 0.4 % — ABNORMAL HIGH (ref 0.0–0.2)

## 2020-08-30 LAB — COMPREHENSIVE METABOLIC PANEL
ALT: 21 U/L (ref 0–44)
AST: 40 U/L (ref 15–41)
Albumin: 3 g/dL — ABNORMAL LOW (ref 3.5–5.0)
Alkaline Phosphatase: 79 U/L (ref 38–126)
Anion gap: 7 (ref 5–15)
BUN: 24 mg/dL — ABNORMAL HIGH (ref 8–23)
CO2: 23 mmol/L (ref 22–32)
Calcium: 8 mg/dL — ABNORMAL LOW (ref 8.9–10.3)
Chloride: 102 mmol/L (ref 98–111)
Creatinine, Ser: 1.74 mg/dL — ABNORMAL HIGH (ref 0.44–1.00)
GFR, Estimated: 27 mL/min — ABNORMAL LOW (ref >60)
Glucose, Bld: 119 mg/dL — ABNORMAL HIGH (ref 70–99)
Potassium: 5 mmol/L (ref 3.5–5.1)
Sodium: 132 mmol/L — ABNORMAL LOW (ref 135–145)
Total Bilirubin: 0.9 mg/dL (ref 0.3–1.2)
Total Protein: 5.5 g/dL — ABNORMAL LOW (ref 6.5–8.1)

## 2020-08-30 LAB — PREPARE RBC (CROSSMATCH)

## 2020-08-30 MED ORDER — FUROSEMIDE 20 MG PO TABS
20.0000 mg | ORAL_TABLET | Freq: Every day | ORAL | Status: DC
  Administered 2020-08-30 – 2020-08-31 (×2): 20 mg via ORAL
  Filled 2020-08-30 (×2): qty 1

## 2020-08-30 MED ORDER — FLEET ENEMA 7-19 GM/118ML RE ENEM
1.0000 | ENEMA | Freq: Once | RECTAL | Status: DC
  Filled 2020-08-30: qty 1

## 2020-08-30 MED ORDER — POLYETHYLENE GLYCOL 3350 17 G PO PACK
17.0000 g | PACK | Freq: Every day | ORAL | Status: DC
  Administered 2020-08-30 – 2020-08-31 (×2): 17 g via ORAL
  Filled 2020-08-30 (×2): qty 1

## 2020-08-30 MED ORDER — SODIUM CHLORIDE 0.9% IV SOLUTION: Freq: Once | INTRAVENOUS | Status: AC

## 2020-08-30 MED ORDER — APIXABAN 2.5 MG PO TABS
2.5000 mg | ORAL_TABLET | Freq: Two times a day (BID) | ORAL | Status: DC
  Administered 2020-08-30 – 2020-08-31 (×3): 2.5 mg via ORAL
  Filled 2020-08-30 (×3): qty 1

## 2020-08-30 MED ORDER — SENNA 8.6 MG PO TABS
1.0000 | ORAL_TABLET | Freq: Two times a day (BID) | ORAL | Status: DC
  Administered 2020-08-30 – 2020-08-31 (×2): 8.6 mg via ORAL
  Filled 2020-08-30 (×2): qty 1

## 2020-08-30 NOTE — Plan of Care (Signed)
  Problem: Education: Goal: Knowledge of General Education information will improve Description: Including pain rating scale, medication(s)/side effects and non-pharmacologic comfort measures Outcome: Progressing   Problem: Health Behavior/Discharge Planning: Goal: Ability to manage health-related needs will improve Outcome: Progressing   Problem: Coping: Goal: Level of anxiety will decrease Outcome: Progressing   Problem: Elimination: Goal: Will not experience complications related to bowel motility Outcome: Progressing   Problem: Pain Managment: Goal: General experience of comfort will improve Outcome: Progressing   Problem: Safety: Goal: Ability to remain free from injury will improve Outcome: Progressing   Problem: Skin Integrity: Goal: Risk for impaired skin integrity will decrease Outcome: Progressing

## 2020-08-30 NOTE — Telephone Encounter (Signed)
Patient currently admitted for laceration on leg

## 2020-08-30 NOTE — Progress Notes (Signed)
Physical Therapy Treatment Patient Details Name: Allison Norman MRN: [id number] DOB: 29-Oct-1924 Today's Date: 08/30/2020    History of Present Illness Pt is a 87 y.o. F who presents with acute blood loss anemia due to RLE laceration. Received unit PRBC on 08/30/20. Significant PMH: paroxysmal atrial fibrillation, HTN, polymyalgia rheumatica.    PT Comments    Pt making good progress.  She reports will have 24 hr support (granddaughter present and confirmed) - updated recommendation to HHPT.  Pt did ambulate 10' with RW and performed a step; however, does still require min A due to posterior lean at times.  Educated pt and family on guarding and use of gait belt.      Follow Up Recommendations  Home health PT;Supervision/Assistance - 24 hour (Family requesting [facility] RN also)     Equipment Recommendations  3in1 (PT)    Recommendations for Other Services       Precautions / Restrictions Precautions Precautions: Fall    Mobility  Bed Mobility               General bed mobility comments: in recliner    Transfers Overall transfer level: Needs assistance Equipment used: Rolling walker (2 wheeled) Transfers: Sit to/from Stand Sit to Stand: Min assist         General transfer comment: Performed from recliner and toilet.  Required min A to rise from toilet, min guard from recliner, but min A to steady once standing from both.  Pt with tendency to lean posteriorly once standing requiring min A and increased time to gain balance and begin walking.  Cued to get closer to walker.  Ambulation/Gait Ambulation/Gait assistance: Min assist Gait Distance (Feet): 40 Feet (40' then 15') Assistive device: Rolling walker (2 wheeled) Gait Pattern/deviations: Step-to pattern;Decreased stride length Gait velocity: decreased   General Gait Details: Cues for RW proxmity with tendency to lean posteriorly intially requiring min A.   Stairs Stairs: Yes Stairs assistance: Min assist Stair  Management: Step to pattern;Forwards;With walker Number of Stairs: 2 General stair comments: Performed 4" platform x 2.  Required max cues on first step for sequencing and RW proximity but able to repeat without cues.   Wheelchair Mobility    Modified Rankin (Stroke Patients Only)       Balance Overall balance assessment: Needs assistance Sitting-balance support: Feet supported Sitting balance-Leahy Scale: Good     Standing balance support: Bilateral upper extremity supported Standing balance-Leahy Scale: Poor Standing balance comment: Requiring RW and with tendency to lean posteriorly requiring min A to correct                            Cognition Arousal/Alertness: Awake/alert Behavior During Therapy: WFL for tasks assessed/performed Overall Cognitive Status: Within Functional Limits for tasks assessed                                        Exercises General Exercises - Lower Extremity Ankle Circles/Pumps: Both;10 reps;Seated Quad Sets: AROM;Both;10 reps;Seated-reclined Gluteal Sets: AROM;10 reps;Both;Seated Long Arc Quad: AROM;Both;10 reps;Seated Heel Slides: AAROM;Right;10 reps;Seated-reclined Hip ABduction/ADduction: AAROM;Right;10 reps;Seated-reclined Other Exercises Other Exercises: R knee flexion in seated position to tolerance Other Exercises: cues for all for correct motion and to tolerance    General Comments General comments (skin integrity, edema, etc.): VSS.  Pt's granddaughter present for session.  Educated on with return home  needs 24 hr supervision, recommend use of gait belt and having hands on guarding with transfers due to pt leaning posteriorly and requiring min A For balance.  Family and pt verbalized understanding.  Also, discussed would recommend 3 in 1 so that would make easier for pt to get up/down off toilet and could be used as shower chair.  Also provided with HEP (used TKA HEP as exercises were appropriate for pt) and  educated on safe car transfers.       Pertinent Vitals/Pain Pain Assessment: Faces Faces Pain Scale: Hurts a little bit Pain Location: RLE Pain Descriptors / Indicators: Discomfort Pain Intervention(s): Limited activity within patient's tolerance;Monitored during session    Home Living                      Prior Function            PT Goals (current goals can now be found in the care plan section) Progress towards PT goals: Progressing toward goals    Frequency    Min 3X/week      PT Plan Current plan remains appropriate    Co-evaluation              AM-PAC PT "6 Clicks" Mobility   Outcome Measure  Help needed turning from your back to your side while in a flat bed without using bedrails?: A Little Help needed moving from lying on your back to sitting on the side of a flat bed without using bedrails?: A Little Help needed moving to and from a bed to a chair (including a wheelchair)?: A Little Help needed standing up from a chair using your arms (e.g., wheelchair or bedside chair)?: A Little Help needed to walk in hospital room?: A Little Help needed climbing 3-5 steps with a railing? : A Little 6 Click Score: 18    End of Session Equipment Utilized During Treatment: Gait belt Activity Tolerance: Patient tolerated treatment well Patient left: in chair;with call bell/phone within reach;with chair alarm set;with family/visitor present Nurse Communication: Mobility status PT Visit Diagnosis: Unsteadiness on feet (R26.81);Muscle weakness (generalized) (M62.81);History of falling (Z91.81);Difficulty in walking, not elsewhere classified (R26.2);Pain Pain - Right/Left: Right Pain - part of body: Leg     Time: 1650-1720 PT Time Calculation (min) (ACUTE ONLY): 30 min  Charges:  $Gait Training: 8-22 mins $Therapeutic Exercise: 8-22 mins                     Abran Richard, PT Acute Rehab Services Pager [phone] [facility] 08/30/2020, 5:38 PM

## 2020-08-30 NOTE — Progress Notes (Signed)
PROGRESS NOTE    KAMILYA WAKEMAN  QQP:[id number] DOB: October 05, 1924 DOA: 08/27/2020 PCP: [name], MD   Brief Narrative: 87 year old with past medical history significant for A. fib and PPM 07/22/2019, on Eliquis, polymyalgia rheumatica on prednisone, hypertension, very high functioning who tripped while ambulating on 8/22  had a large right knee laceration with persistent bleeding.  Difficult obtaining hemostasis.  ED physician close laceration with stitches, pressure dressing was placed.     Assessment & Plan:   Principal Problem:   Acute blood loss anemia Active Problems:   Essential hypertension   Polymyalgia rheumatica (HCC)   Persistent atrial fibrillation (HCC)   Laceration of right lower extremity   CKD (chronic kidney disease), stage IV (HCC)  1-Acute fall with laceration of the right knee: -Patient had pressure dressing in place, she was complaining of worsening right leg swelling and dressing was very tight.  I remove the dressing.  Plan to proceed with Neosporin twice daily, and dressing. -appreciate Ortho follow up, local care.  -tramadol PRN for pain.   2-Acute blood loss anemia: Secondary to right knee laceration: Hemoglobin on admission at 11 and has decreased to 8.  Plan to repeat CBC tomorrow Hb down to 7.9 CT abdomen negative for retroperitoneal bleed.  Plan to transfuse one unit PRBC.   3-AKI on CKD III B.  Cr baseline 1.7--1.9 Worsening function related to hypoperfusion.  Improved with IV fluids, cr down to 1.7 baseline.  CT scan with bladder distension, plan for bladder scan. Might need foley catheter.   Constipation; Start Miralax.  Fleet enema.  CT abdomen: large colonic stool burden.   Distension of gallbladder by CT scan: no other additional finding of cholecystitis. If clinical concern HIDA scan maybe obtain.  -Patient denies abdominal pain, no nausea today. Eating.  -Non tenderness on abdominal exam.   Paroxysmal A. fib: Status post  pacemaker Continue with amiodarone and Coreg Resume Eliquis.   Polymyalgia rheumatica: Continue with chronic prednisone  Hypertension: Continue with amlodipine and resume Coreg Resume clonidine.   Estimated body mass index is 23.77 kg/m as calculated from the following:   Height as of this encounter: 5' 5.5" (1.664 m).   Weight as of this encounter: 65.8 kg.   DVT prophylaxis: hold eliquis today Code Status: Full code Family Communication: GrandDaughter at bedside.  Disposition Plan:  Status is: Inpatient  Remains inpatient appropriate because:IV treatments appropriate due to intensity of illness or inability to take PO  Dispo: The patient is from: Home              Anticipated d/c is to: Home              Patient currently is not medically stable to d/c.   Difficult to place patient No        Consultants:  Ortho  Procedures:    Antimicrobials:    Subjective: She is feeling better. She just urinated large amount.  She denies RUQ pain. She is eating today. No nausea.    Objective: Vitals:   08/30/20 0858 08/30/20 1115 08/30/20 1142 08/30/20 1403  BP: (!) 146/96 125/77 120/71 131/69  Pulse: 95 (!) 103 96 (!) 104  Resp: 16 16 16 16   Temp: 98.2 F (36.8 C) 99.2 F (37.3 C) 98.3 F (36.8 C) 97.9 F (36.6 C)  TempSrc: Oral Oral Oral Oral  SpO2: 94% 90% 95% 93%  Weight:      Height:        Intake/Output Summary (Last  24 hours) at 08/30/2020 1455 Last data filed at 08/30/2020 1403 Gross per 24 hour  Intake 1936.54 ml  Output 450 ml  Net 1486.54 ml    Filed Weights   08/27/20 1420 08/30/20 0500  Weight: 63.5 kg 65.8 kg    Examination:  General exam: NAD Respiratory system: CTA Cardiovascular system: S 1, S 2 RRR Gastrointestinal system: BS present, soft, nt Central nervous system: Alert, following command Extremities: Right LE with less edema. Knee with dressing  Data Reviewed: I have personally reviewed following labs and imaging  studies  CBC: Recent Labs  Lab 08/27/20 1538 08/27/20 2204 08/28/20 0030 08/28/20 0525 08/28/20 0922 08/29/20 0342 08/30/20 0346  WBC 6.9 8.3  --  10.0  --  10.7* 9.8  NEUTROABS 4.3 4.9  --   --   --  6.9  --   HGB 11.0* 9.2* 7.9* 8.2* 9.0* 8.0* 7.9*  HCT 35.7* 29.8* 25.6* 25.8* 28.9* 24.9* 25.3*  MCV 93.0 93.1  --  91.5  --  90.5 93.4  PLT 215 200  --  183  --  202 256    Basic Metabolic Panel: Recent Labs  Lab 08/28/20 0525 08/29/20 0420 08/30/20 0346  NA 136 136 132*  K 4.6 4.4 5.0  CL 107 105 102  CO2 21* 20* 23  GLUCOSE 106* 139* 119*  BUN 42* 34* 24*  CREATININE 2.10* 2.22* 1.74*  CALCIUM 7.8* 7.7* 8.0*  MG 2.3  --   --     GFR: Estimated Creatinine Clearance: 18.2 mL/min (A) (by C-G formula based on SCr of 1.74 mg/dL (H)). Liver Function Tests: Recent Labs  Lab 08/28/20 0525 08/29/20 0420 08/30/20 0346  AST 23 33 40  ALT 18 19 21   ALKPHOS 72 66 79  BILITOT 0.9 0.6 0.9  PROT 5.0* 5.3* 5.5*  ALBUMIN 2.9* 3.0* 3.0*    No results for input(s): LIPASE, AMYLASE in the last 168 hours. No results for input(s): AMMONIA in the last 168 hours. Coagulation Profile: Recent Labs  Lab 08/28/20 0634  INR 1.5*    Cardiac Enzymes: No results for input(s): CKTOTAL, CKMB, CKMBINDEX, TROPONINI in the last 168 hours. BNP (last 3 results) No results for input(s): PROBNP in the last 8760 hours. HbA1C: No results for input(s): HGBA1C in the last 72 hours. CBG: No results for input(s): GLUCAP in the last 168 hours. Lipid Profile: No results for input(s): CHOL, HDL, LDLCALC, TRIG, CHOLHDL, LDLDIRECT in the last 72 hours. Thyroid Function Tests: No results for input(s): TSH, T4TOTAL, FREET4, T3FREE, THYROIDAB in the last 72 hours. Anemia Panel: No results for input(s): VITAMINB12, FOLATE, FERRITIN, TIBC, IRON, RETICCTPCT in the last 72 hours. Sepsis Labs: No results for input(s): PROCALCITON, LATICACIDVEN in the last 168 hours.  Recent Results (from the past  240 hour(s))  Resp Panel by RT-PCR (Flu A&B, Covid) Nasopharyngeal Swab     Status: None   Collection Time: 08/27/20 11:40 PM   Specimen: Nasopharyngeal Swab; Nasopharyngeal(NP) swabs in vial transport medium  Result Value Ref Range Status   SARS Coronavirus 2 by RT PCR NEGATIVE NEGATIVE Final    Comment: (NOTE) SARS-CoV-2 target nucleic acids are NOT DETECTED.  The SARS-CoV-2 RNA is generally detectable in upper respiratory specimens during the acute phase of infection. The lowest concentration of SARS-CoV-2 viral copies this assay can detect is 138 copies/mL. A negative result does not preclude SARS-Cov-2 infection and should not be used as the sole basis for treatment or other patient management decisions. A negative  result may occur with  improper specimen collection/handling, submission of specimen other than nasopharyngeal swab, presence of viral mutation(s) within the areas targeted by this assay, and inadequate number of viral copies(<138 copies/mL). A negative result must be combined with clinical observations, patient history, and epidemiological information. The expected result is Negative.  Fact Sheet for Patients:  EntrepreneurPulse.com.au  Fact Sheet for Healthcare Providers:  IncredibleEmployment.be  This test is no t yet approved or cleared by the [place] FDA and  has been authorized for detection and/or diagnosis of SARS-CoV-2 by FDA under an Emergency Use Authorization (EUA). This EUA will remain  in effect (meaning this test can be used) for the duration of the COVID-19 declaration under Section 564(b)(1) of the Act, 21 U.S.C.section 360bbb-3(b)(1), unless the authorization is terminated  or revoked sooner.       Influenza A by PCR NEGATIVE NEGATIVE Final   Influenza B by PCR NEGATIVE NEGATIVE Final    Comment: (NOTE) The Xpert Xpress SARS-CoV-2/FLU/RSV plus assay is intended as an aid in the diagnosis of influenza  from Nasopharyngeal swab specimens and should not be used as a sole basis for treatment. Nasal washings and aspirates are unacceptable for Xpert Xpress SARS-CoV-2/FLU/RSV testing.  Fact Sheet for Patients: EntrepreneurPulse.com.au  Fact Sheet for Healthcare Providers: IncredibleEmployment.be  This test is not yet approved or cleared by the [place] FDA and has been authorized for detection and/or diagnosis of SARS-CoV-2 by FDA under an Emergency Use Authorization (EUA). This EUA will remain in effect (meaning this test can be used) for the duration of the COVID-19 declaration under Section 564(b)(1) of the Act, 21 U.S.C. section 360bbb-3(b)(1), unless the authorization is terminated or revoked.  Performed at [facility], [place] [address], [place], [place] [address]           Radiology Studies: CT ABDOMEN PELVIS WO CONTRAST  Result Date: 08/30/2020 CLINICAL DATA:  Fall, anemia EXAM: CT ABDOMEN AND PELVIS WITHOUT CONTRAST TECHNIQUE: Multidetector CT imaging of the abdomen and pelvis was performed following the standard protocol without IV contrast. COMPARISON:  CT abdomen/pelvis 03/12/2007 FINDINGS: Lower chest: There is scarring in the lung bases. There is a trace right pleural effusion with adjacent subsegmental atelectasis. The heart is markedly enlarged are dense mitral annular calcifications and aortic valve calcifications, incompletely imaged. Cardiac device leads are partially imaged. Hepatobiliary: The liver parenchyma is normal in appearance. There is marked dilation of the intrahepatic IVC and hepatic veins. No focal lesions are identified. The gallbladder is markedly distended without wall thickening or pericholecystic fluid. The common bile duct is borderline enlarged at 9-10 mm. Pancreas: Unremarkable. Spleen: Innumerable calcified granulomas are seen throughout the spleen, unchanged. Adrenals/Urinary Tract: The adrenals are  unremarkable. Hypodense lesions in the right kidneys are likely cysts, measuring up to 2.5 cm. There are no other focal lesions, within the confines of noncontrast technique. There are no stones. There is no hydronephrosis or hydroureter. The bladder is markedly distended. Stomach/Bowel: The stomach is grossly unremarkable. There is no evidence of bowel obstruction. There is a large colonic stool burden. There is no evidence of abnormal bowel wall thickening or inflammatory change. Vascular/Lymphatic: There is extensive calcification throughout the nonaneurysmal abdominal aorta. There is no abdominal or pelvic lymphadenopathy. Reproductive: The uterus and adnexa are unremarkable. Other: There is no ascites or free air. There is no evidence of retroperitoneal hematoma. Musculoskeletal: There is mild dextroscoliosis of the lumbar spine. There is mild multilevel degenerative change of the lumbar spine. There is  no acute osseous abnormality or aggressive osseous lesion. IMPRESSION: 1. No evidence of traumatic injury in the abdomen or pelvis; no evidence of retroperitoneal hematoma. 2. Large colonic stool burden without evidence of mechanical obstruction. 3. Marked distention of the gallbladder without additional findings of acute cholecystitis and borderline dilated common bile duct with no obstructing lesions seen. If acute cholecystitis is of clinical concern, HIDA scan may be obtained. 4. Marked distention of the urinary bladder. Consider decompression. 5. Four-chamber cardiomegaly with significant dilation of the IVC and hepatic veins suggesting right heart failure. 6. Trace right pleural effusion. 7. Dense mitral annular calcifations. Electronically Signed   By: Valetta Mole M.D.   On: 08/30/2020 14:23        Scheduled Meds:  amiodarone  100 mg Oral Daily   amLODipine  5 mg Oral QHS   apixaban  2.5 mg Oral BID   carvedilol  3.125 mg Oral BID WC   cloNIDine  0.2 mg Oral BID   furosemide  20 mg Oral Daily    neomycin-bacitracin-polymyxin   Topical BID   pantoprazole  40 mg Oral BID   polyethylene glycol  17 g Oral Daily   predniSONE  5 mg Oral QODAY   senna  1 tablet Oral BID   sodium phosphate  1 enema Rectal Once   Continuous Infusions:     LOS: 2 days    Time spent: 35 minutes.     Elmarie Shiley, MD [facility]   If 7PM-7AM, please contact night-coverage [email or web address]  08/30/2020, 2:55 PM

## 2020-08-30 NOTE — Progress Notes (Signed)
PT Cancellation Note  Patient Details Name: YUMI INSALACO MRN: [id number] DOB: August 30, 1924   Cancelled Treatment:    Reason Eval/Treat Not Completed: (P) Medical issues which prohibited therapy (Pt receiving blood transfusion will follow up after completion.)   Cristela Blue 08/30/2020, 11:51 AM Erasmo Leventhal , PTA Acute Rehabilitation Services Pager [phone] Office [phone]

## 2020-08-30 NOTE — Progress Notes (Signed)
Blood transfusion complete. Pt states she is feeling fine. No reaction noted during the transfusion. Vitals remained stable.

## 2020-08-31 LAB — CBC
HCT: 28.7 % — ABNORMAL LOW (ref 36.0–46.0)
Hemoglobin: 8.9 g/dL — ABNORMAL LOW (ref 12.0–15.0)
MCH: 28.6 pg (ref 26.0–34.0)
MCHC: 31 g/dL (ref 30.0–36.0)
MCV: 92.3 fL (ref 80.0–100.0)
Platelets: 229 10*3/uL (ref 150–400)
RBC: 3.11 MIL/uL — ABNORMAL LOW (ref 3.87–5.11)
RDW: 16.5 % — ABNORMAL HIGH (ref 11.5–15.5)
WBC: 8.3 10*3/uL (ref 4.0–10.5)
nRBC: 1 % — ABNORMAL HIGH (ref 0.0–0.2)

## 2020-08-31 LAB — COMPREHENSIVE METABOLIC PANEL
ALT: 17 U/L (ref 0–44)
AST: 31 U/L (ref 15–41)
Albumin: 2.6 g/dL — ABNORMAL LOW (ref 3.5–5.0)
Alkaline Phosphatase: 75 U/L (ref 38–126)
Anion gap: 7 (ref 5–15)
BUN: 23 mg/dL (ref 8–23)
CO2: 23 mmol/L (ref 22–32)
Calcium: 7.8 mg/dL — ABNORMAL LOW (ref 8.9–10.3)
Chloride: 101 mmol/L (ref 98–111)
Creatinine, Ser: 1.93 mg/dL — ABNORMAL HIGH (ref 0.44–1.00)
GFR, Estimated: 24 mL/min — ABNORMAL LOW (ref >60)
Glucose, Bld: 94 mg/dL (ref 70–99)
Potassium: 4.8 mmol/L (ref 3.5–5.1)
Sodium: 131 mmol/L — ABNORMAL LOW (ref 135–145)
Total Bilirubin: 0.7 mg/dL (ref 0.3–1.2)
Total Protein: 4.8 g/dL — ABNORMAL LOW (ref 6.5–8.1)

## 2020-08-31 LAB — URINALYSIS, ROUTINE W REFLEX MICROSCOPIC
Bilirubin Urine: NEGATIVE
Glucose, UA: NEGATIVE mg/dL
Hgb urine dipstick: NEGATIVE
Ketones, ur: NEGATIVE mg/dL
Leukocytes,Ua: NEGATIVE
Nitrite: NEGATIVE
Protein, ur: NEGATIVE mg/dL
Specific Gravity, Urine: 1.003 — ABNORMAL LOW (ref 1.005–1.030)
pH: 6 (ref 5.0–8.0)

## 2020-08-31 LAB — TYPE AND SCREEN
ABO/RH(D): O POS
Antibody Screen: NEGATIVE
Unit division: 0

## 2020-08-31 LAB — BPAM RBC
Blood Product Expiration Date: 202209262359
ISSUE DATE / TIME: 202208251116
Unit Type and Rh: 5100

## 2020-08-31 MED ORDER — POLYETHYLENE GLYCOL 3350 17 G PO PACK: 17.0000 g | PACK | Freq: Two times a day (BID) | ORAL | 0 refills | Status: DC

## 2020-08-31 MED ORDER — POLYETHYLENE GLYCOL 3350 17 G PO PACK: 17.0000 g | PACK | Freq: Two times a day (BID) | ORAL | Status: DC

## 2020-08-31 MED ORDER — BISACODYL 10 MG RE SUPP
10.0000 mg | Freq: Once | RECTAL | Status: DC
  Filled 2020-08-31: qty 1

## 2020-08-31 MED ORDER — TRAMADOL HCL 50 MG PO TABS: 25.0000 mg | ORAL_TABLET | Freq: Four times a day (QID) | ORAL | 0 refills | Status: DC | PRN

## 2020-08-31 MED ORDER — CLONIDINE HCL 0.2 MG PO TABS: 0.2000 mg | ORAL_TABLET | Freq: Two times a day (BID) | ORAL | 1 refills | Status: DC

## 2020-08-31 MED ORDER — TAMSULOSIN HCL 0.4 MG PO CAPS
0.4000 mg | ORAL_CAPSULE | Freq: Every day | ORAL | Status: DC
  Administered 2020-08-31: 0.4 mg via ORAL
  Filled 2020-08-31: qty 1

## 2020-08-31 MED ORDER — SODIUM CHLORIDE 0.9 % IV SOLN: INTRAVENOUS | Status: DC

## 2020-08-31 MED ORDER — SENNA 8.6 MG PO TABS: 1.0000 | ORAL_TABLET | Freq: Two times a day (BID) | ORAL | 0 refills | Status: DC

## 2020-08-31 MED ORDER — PANTOPRAZOLE SODIUM 40 MG PO TBEC: 40.0000 mg | DELAYED_RELEASE_TABLET | Freq: Two times a day (BID) | ORAL | 1 refills | Status: DC

## 2020-08-31 MED ORDER — TAMSULOSIN HCL 0.4 MG PO CAPS: 0.4000 mg | ORAL_CAPSULE | Freq: Every day | ORAL | 1 refills | Status: DC

## 2020-08-31 MED ORDER — FERROUS SULFATE 325 (65 FE) MG PO TABS: 325.0000 mg | ORAL_TABLET | Freq: Every day | ORAL | 3 refills | Status: DC

## 2020-08-31 NOTE — Care Management Important Message (Signed)
Important Message  Patient Details  Name: Allison Norman MRN: [id number] Date of Birth: 12-25-1924   Medicare Important Message Given:  Yes     Orbie Pyo 08/31/2020, 4:34 PM

## 2020-08-31 NOTE — Progress Notes (Signed)
Occupational Therapy Treatment Patient Details Name: Allison Norman MRN: [id number] DOB: Nov 17, 1924 Today's Date: 08/31/2020    History of present illness Pt is a 87 y.o. F who presents with acute blood loss anemia due to RLE laceration. Received unit [facility] on 08/30/20. Significant PMH: paroxysmal atrial fibrillation, HTN, polymyalgia rheumatica.   OT comments  Pt making great progress with OT goals this session. She was able to complete toileting in the bathroom and hygiene at the sink with supervision - min guard for safety. Pt demonstrated no loss of balance this session and required no assist with transfers or mobility. Recommendations updated to [facility]. OT will continue to follow acutely.    Follow Up Recommendations  Home health OT;Supervision - Intermittent    Equipment Recommendations  3 in 1 bedside commode    Recommendations for Other Services      Precautions / Restrictions Precautions Precautions: Fall Restrictions Weight Bearing Restrictions: No       Mobility Bed Mobility Overal bed mobility: Needs Assistance Bed Mobility: Supine to Sit;Sit to Supine     Supine to sit: Modified independent (Device/Increase time) Sit to supine: Modified independent (Device/Increase time)   General bed mobility comments: HOB elevated, increased time    Transfers Overall transfer level: Needs assistance Equipment used: Rolling walker (2 wheeled) Transfers: Sit to/from Stand Sit to Stand: Supervision         General transfer comment: Pt completed sit<>Stand with no difficulties this session from bed in llowest setting and toilet.    Balance Overall balance assessment: No apparent balance deficits (not formally assessed) Sitting-balance support: Feet supported Sitting balance-Leahy Scale: Good     Standing balance support: Bilateral upper extremity supported Standing balance-Leahy Scale: Fair Standing balance comment: Pt maintaining upright posture dynamically and  statically                           ADL either performed or assessed with clinical judgement   ADL Overall ADL's : Needs assistance/impaired Eating/Feeding: Independent;Sitting   Grooming: Wash/dry face;Supervision/safety;Standing Grooming Details (indicate cue type and reason): completed at sink                 Toilet Transfer: Min guard;Ambulation Toilet Transfer Details (indicate cue type and reason): ambulated to bathroom, completed on toilet Toileting- Clothing Manipulation and Hygiene: Min guard;Sitting/lateral lean;Sit to/from stand Toileting - Clothing Manipulation Details (indicate cue type and reason): completd on toilet     Functional mobility during ADLs: Supervision/safety;Rolling walker General ADL Comments: Pt with increased endurance this session, completing toileting and hygiene along with room distance mobility,     Vision   Vision Assessment?: No apparent visual deficits   Perception     Praxis      Cognition Arousal/Alertness: Awake/alert Behavior During Therapy: WFL for tasks assessed/performed Overall Cognitive Status: Within Functional Limits for tasks assessed                                          Exercises     Shoulder Instructions       General Comments VSS on RA    Pertinent Vitals/ Pain       Pain Assessment: No/denies pain Pain Intervention(s): Monitored during session  Home Living  Prior Functioning/Environment              Frequency  Min 2X/week        Progress Toward Goals  OT Goals(current goals can now be found in the care plan section)  Progress towards OT goals: Progressing toward goals  Acute Rehab OT Goals Patient Stated Goal: Pt ready to go home OT Goal Formulation: With patient Time For Goal Achievement: 09/11/20 Potential to Achieve Goals: Good ADL Goals Pt Will Perform Grooming: with  supervision;standing Pt Will Perform Lower Body Bathing: with min guard assist;sit to/from stand Pt Will Perform Lower Body Dressing: with min assist;sit to/from stand Pt Will Transfer to Toilet: with min guard assist;bedside commode;ambulating Pt Will Perform Toileting - Clothing Manipulation and hygiene: with min guard assist;sit to/from stand Pt Will Perform Tub/Shower Transfer: with min guard assist;ambulating;shower seat;grab bars;rolling walker  Plan Discharge plan remains appropriate;Frequency remains appropriate    Co-evaluation                 AM-PAC OT "6 Clicks" Daily Activity     Outcome Measure   Help from another person eating meals?: None Help from another person taking care of personal grooming?: A Little Help from another person toileting, which includes using toliet, bedpan, or urinal?: A Little Help from another person bathing (including washing, rinsing, drying)?: A Little Help from another person to put on and taking off regular upper body clothing?: Total Help from another person to put on and taking off regular lower body clothing?: A Little 6 Click Score: 17    End of Session Equipment Utilized During Treatment: Rolling walker  OT Visit Diagnosis: Unsteadiness on feet (R26.81);Other abnormalities of gait and mobility (R26.89);Muscle weakness (generalized) (M62.81);History of falling (Z91.81);Pain Pain - Right/Left: Left Pain - part of body: Knee   Activity Tolerance Patient tolerated treatment well   Patient Left in bed;with call bell/phone within reach   Nurse Communication Mobility status        Time: 1140-1151 OT Time Calculation (min): 11 min  Charges: OT General Charges $OT Visit: 1 Visit OT Treatments $Self Care/Home Management : 8-22 mins  Davone Shinault H., OTR/L Acute Rehabilitation  [name] 08/31/2020, 1:48 PM

## 2020-08-31 NOTE — TOC Transition Note (Signed)
Transition of Care [facility]) - CM/SW Discharge Note   Patient Details  Name: Allison Norman MRN: [id number] Date of Birth: Jun 16, 1924  [facility] CM/SW Contact:  Sharin Mons, RN Phone Number: 08/31/2020, 4:02 PM   Clinical Narrative:    Patient will DC to: Home Anticipated DC date: 08/31/2020 Family notified: yes Transport by: car  - s/p fall, Laceration to the right lower extremity Pt from home alone with 24/7 aide PCS. Supportive daughter. Per MD patient ready for today . DC today. RN, patient, and patient's daughter notified of DC. Pt agreeable to home health services. Pt without preference. Refrral made with [facility] and accepted. DME rolling walker will be delivered to bedside prior to d/c.  Pt without Rx med concerns. Post hospital f/u noted on AVS.  Georgiann Mccoy (Daughter)      [phone]      RNCM will sign off for now as intervention is no longer needed. Please consult [place] again if new needs arise.    Final next level of care: [place] Barriers to Discharge: No Barriers Identified   Patient Goals and CMS Choice     Choice offered to / list presented to : Patient  Discharge Placement                       Discharge Plan and Services   Discharge Planning Services: CM Consult            DME Arranged: 3-N-1 DME Agency: [employer] Date DME Agency Contacted: 08/31/20 Time DME Agency Contacted: [phone] Representative spoke with at DME Agency: Freda Munro HH Arranged: RN, PT, Disease Management, OT, Nurse's Aide, Social Work [facility] Agency: [place] Date [place]: 08/31/20 Time [place]: 37 Representative spoke with at [place]: [place] ([place]) Interventions     Readmission Risk Interventions No flowsheet data found.

## 2020-08-31 NOTE — Progress Notes (Signed)
Patient's bladder scan showed >900, patient was able to void 200 cc's and bladder scan was repeated. Bladder scan still showed >900. Patient was straight cathed, 1400 CC's of clear yellow urine. Patient already had discharge orders, MD paged. Orders to place foley, obtain UA, and d/c patient with a foley.

## 2020-08-31 NOTE — Plan of Care (Signed)

## 2020-08-31 NOTE — Progress Notes (Signed)
Patient's granddaughter will schedule a follow-up appointment with [facility],   Patient was discharged to home, AVS was reviewed. Foley care was explained and a handout provided. Patient's DME arrive and was sent with the patient. Nurse tech assisted patient to the exit, patient's daughter provided transportation.

## 2020-08-31 NOTE — Discharge Summary (Addendum)
Physician Discharge Summary  Allison Norman PZW:[id number] DOB: 1924-12-08 DOA: 08/27/2020  PCP: [name], MD  Admit date: 08/27/2020 Discharge date: 08/31/2020  Admitted From: Home  Disposition:  Home   Recommendations for Outpatient Follow-up:  Follow up with PCP in 1-2 weeks Please obtain BMP/CBC in one week Needs to follow up with urologist for urinary retention.  Needs to follow up with PCP in 10 days from suture for knee injury.  Needs repeat CBC./   Home Health: yes  Discharge Condition: Stable.  CODE STATUS: Full Code Diet recommendation: Heart Healthy   Brief/Interim Summary: 87 year old with past medical history significant for A. fib and PPM 07/22/2019, on Eliquis, polymyalgia rheumatica on prednisone, hypertension, very high functioning who tripped while ambulating on 8/22  had a large right knee laceration with persistent bleeding.  Difficult obtaining hemostasis.  ED physician close laceration with stitches, pressure dressing was placed.    1-Acute fall with laceration of the right knee: -Patient had pressure dressing in place, she was complaining of worsening right leg swelling and dressing was very tight.  I remove the dressing.  Plan to proceed with Neosporin twice daily, and dressing. -appreciate Ortho follow up, local care.  -tramadol PRN for pain.   -stable.   2-Acute blood loss anemia: Secondary to right knee laceration: Hemoglobin on admission at 11 and has decreased to 8.  Plan to repeat CBC tomorrow Hb down to 7.9 CT abdomen negative for retroperitoneal bleed.  Plan to transfuse one unit PRBC.    3-AKI on CKD IV  Urinary retention.  Cr baseline 1.7--1.9 Worsening function related to hypoperfusion and component of urine retention.  Improved with IV fluids, cr down to 1.7 baseline.  CT scan with bladder distension, plan for bladder scan.  She urinary today 300 cc, couldn't urinate more, Bladder scan perform showed more than 900. In and out yield  1400.  Plan to discharge with foley catheter. Will arrange follow up with urologist([facility] will [facility] granddaughter). Started flomax. No evidence of infection. No leukocytosis no fever. Will check UA. If sign of infection will provide antibiotics at discharge.    Constipation; Continue with Miralax BID Politely decline Enema.   CT abdomen: large colonic stool burden.  Received suppository.  Had BM after suppository.   Distension of gallbladder by CT scan: no other additional finding of cholecystitis. If clinical concern HIDA scan maybe obtain.  -Patient denies abdominal pain, no nausea today. Eating.  -Non tenderness on abdominal exam.    Paroxysmal A. fib: Status post pacemaker Continue with amiodarone and Coreg Resume Eliquis.    Polymyalgia rheumatica: Continue with chronic prednisone   Hypertension: Continue with amlodipine and resume Coreg Resume clonidine lower home dose.  Chronic Diastolic HF; Compensated.   Estimated body mass index is 23.77 kg/m as calculated from the following:   Height as of this encounter: 5' 5.5" (1.664 m).   Weight as of this encounter: 65.8 kg.      Discharge Diagnoses:  Principal Problem:   Acute blood loss anemia Active Problems:   Essential hypertension   Polymyalgia rheumatica (HCC)   Persistent atrial fibrillation (HCC)   Laceration of right lower extremity   CKD (chronic kidney disease), stage IV [facility])    Discharge Instructions  Discharge Instructions     Diet - low sodium heart healthy   Complete by: As directed    Discharge wound care:   Complete by: As directed    See AVS   Increase activity slowly  Complete by: As directed       Allergies as of 08/31/2020       Reactions   Codeine Itching   Penicillins Itching, Rash, Other (See Comments)   Has patient had a PCN reaction causing immediate rash, facial/tongue/throat swelling, SOB or lightheadedness with hypotension: Yes Has patient had a PCN reaction  causing severe rash involving mucus membranes or skin necrosis: No Has patient had a PCN reaction that required hospitalization: No Has patient had a PCN reaction occurring within the last 10 years: No If all of the above answers are "NO", then may proceed with Cephalosporin use.   Doxazosin    Heavy feeling in chest, congestion, wheezing   Doxycycline Other (See Comments)   Cause chest tightness   Doxycycline Hyclate Other (See Comments)   Hydralazine    Felt bad and could hear pulse in head         Medication List     STOP taking these medications    losartan 100 MG tablet Commonly known as: COZAAR   minoxidil 2.5 MG tablet Commonly known as: LONITEN       TAKE these medications    acetaminophen 500 MG tablet Commonly known as: TYLENOL Take 1,000 mg by mouth every 6 (six) hours as needed for moderate pain or headache.   amiodarone 200 MG tablet Commonly known as: PACERONE Take 0.5 tablets (100 mg total) by mouth daily.   amLODipine 5 MG tablet Commonly known as: NORVASC Take 1 tablet by mouth once daily What changed: when to take this   carvedilol 3.125 MG tablet Commonly known as: COREG Take 1 tablet (3.125 mg total) by mouth 2 (two) times daily with a meal.   cloNIDine 0.2 MG tablet Commonly known as: CATAPRES Take 1 tablet (0.2 mg total) by mouth 2 (two) times daily. What changed:  medication strength how much to take when to take this   diazepam 2 MG tablet Commonly known as: VALIUM Take 2 mg by mouth at bedtime as needed (for sleep).   Eliquis 2.5 MG Tabs tablet Generic drug: apixaban Take 1 tablet by mouth twice daily What changed: how much to take   Eylea 2 MG/0.05ML Soln Generic drug: Aflibercept by Intravitreal route. Inject in R eye every 11 weeks per Retina specialist for Macular Degenration   furosemide 20 MG tablet Commonly known as: LASIX Take 20 mg by mouth daily.   pantoprazole 40 MG tablet Commonly known as: PROTONIX Take 1  tablet (40 mg total) by mouth 2 (two) times daily.   polyethylene glycol 17 g packet Commonly known as: MIRALAX / GLYCOLAX Take 17 g by mouth 2 (two) times daily.   predniSONE 5 MG tablet Commonly known as: DELTASONE Take 5 mg by mouth every other day.   PreserVision AREDS 2 Caps Take 1 capsule by mouth every evening.   senna 8.6 MG Tabs tablet Commonly known as: SENOKOT Take 1 tablet (8.6 mg total) by mouth 2 (two) times daily.   traMADol 50 MG tablet Commonly known as: ULTRAM Take 0.5 tablets (25 mg total) by mouth every 6 (six) hours as needed for moderate pain.   VITAMIN-B COMPLEX PO Take 1 tablet by mouth daily.               Discharge Care Instructions  (From admission, onward)           Start     Ordered   08/31/20 0000  Discharge wound care:  Comments: See AVS   08/31/20 1118            Allergies  Allergen Reactions   Codeine Itching   Penicillins Itching, Rash and Other (See Comments)    Has patient had a PCN reaction causing immediate rash, facial/tongue/throat swelling, SOB or lightheadedness with hypotension: Yes Has patient had a PCN reaction causing severe rash involving mucus membranes or skin necrosis: No Has patient had a PCN reaction that required hospitalization: No Has patient had a PCN reaction occurring within the last 10 years: No If all of the above answers are "NO", then may proceed with Cephalosporin use.   Doxazosin     Heavy feeling in chest, congestion, wheezing   Doxycycline Other (See Comments)    Cause chest tightness   Doxycycline Hyclate Other (See Comments)   Hydralazine     Felt bad and could hear pulse in head     Consultations: Ortho   Procedures/Studies: CT ABDOMEN PELVIS WO CONTRAST  Result Date: 08/30/2020 CLINICAL DATA:  Fall, anemia EXAM: CT ABDOMEN AND PELVIS WITHOUT CONTRAST TECHNIQUE: Multidetector CT imaging of the abdomen and pelvis was performed following the standard protocol without IV  contrast. COMPARISON:  CT abdomen/pelvis 03/12/2007 FINDINGS: Lower chest: There is scarring in the lung bases. There is a trace right pleural effusion with adjacent subsegmental atelectasis. The heart is markedly enlarged are dense mitral annular calcifications and aortic valve calcifications, incompletely imaged. Cardiac device leads are partially imaged. Hepatobiliary: The liver parenchyma is normal in appearance. There is marked dilation of the intrahepatic IVC and hepatic veins. No focal lesions are identified. The gallbladder is markedly distended without wall thickening or pericholecystic fluid. The common bile duct is borderline enlarged at 9-10 mm. Pancreas: Unremarkable. Spleen: Innumerable calcified granulomas are seen throughout the spleen, unchanged. Adrenals/Urinary Tract: The adrenals are unremarkable. Hypodense lesions in the right kidneys are likely cysts, measuring up to 2.5 cm. There are no other focal lesions, within the confines of noncontrast technique. There are no stones. There is no hydronephrosis or hydroureter. The bladder is markedly distended. Stomach/Bowel: The stomach is grossly unremarkable. There is no evidence of bowel obstruction. There is a large colonic stool burden. There is no evidence of abnormal bowel wall thickening or inflammatory change. Vascular/Lymphatic: There is extensive calcification throughout the nonaneurysmal abdominal aorta. There is no abdominal or pelvic lymphadenopathy. Reproductive: The uterus and adnexa are unremarkable. Other: There is no ascites or free air. There is no evidence of retroperitoneal hematoma. Musculoskeletal: There is mild dextroscoliosis of the lumbar spine. There is mild multilevel degenerative change of the lumbar spine. There is no acute osseous abnormality or aggressive osseous lesion. IMPRESSION: 1. No evidence of traumatic injury in the abdomen or pelvis; no evidence of retroperitoneal hematoma. 2. Large colonic stool burden without  evidence of mechanical obstruction. 3. Marked distention of the gallbladder without additional findings of acute cholecystitis and borderline dilated common bile duct with no obstructing lesions seen. If acute cholecystitis is of clinical concern, HIDA scan may be obtained. 4. Marked distention of the urinary bladder. Consider decompression. 5. Four-chamber cardiomegaly with significant dilation of the IVC and hepatic veins suggesting right heart failure. 6. Trace right pleural effusion. 7. Dense mitral annular calcifations. Electronically Signed   By: Valetta Mole M.D.   On: 08/30/2020 14:23   DG Knee 2 Views Right  Result Date: 08/27/2020 CLINICAL DATA:  History of fall EXAM: RIGHT KNEE - 1-2 VIEW COMPARISON:  None. FINDINGS: No acute fracture or dislocation.  Status post total right knee replacement with no evidence of periprosthetic lucency or fracture. Cannot evaluate for pleural effusion due to overlying bandage. IMPRESSION: No acute fracture or dislocation. Electronically Signed   By: Yetta Glassman M.D.   On: 08/27/2020 16:28   Intravitreal Injection, Pharmacologic Agent - OD - Right Eye  Result Date: 08/14/2020 Time Out 08/14/2020. 3:11 PM. Confirmed correct patient, procedure, site, and patient consented. Anesthesia Topical anesthesia was used. Anesthetic medications included Akten 3.5%. Procedure Preparation included Tobramycin 0.3%, 10% betadine to eyelids, 5% betadine to ocular surface, Ofloxacin . A 30 gauge needle was used. Injection: 2 mg aflibercept 2 MG/0.05ML   Route: Intravitreal, Site: Right Eye   NDC: [id number], Lot: [id number], Waste: 0 mL Post-op Post injection exam found visual acuity of at least counting fingers. The patient tolerated the procedure well. There were no complications. The patient received written and verbal post procedure care education. Post injection medications were not given.   OCT, Retina - OU - Both Eyes  Result Date: 08/14/2020 Right Eye Quality was good.  Scan locations included subfoveal. Central Foveal Thickness: 290. Progression has improved. Findings include intraretinal fluid, vitreomacular adhesion , pigment epithelial detachment, subretinal scarring, disciform scar. Left Eye Quality was good. Scan locations included subfoveal. Central Foveal Thickness: 296. Progression has been stable. Findings include no SRF, retinal drusen , outer retinal atrophy, central retinal atrophy, subretinal hyper-reflective material, subretinal scarring. Notes Pigment epithelial detachment with intraretinal fluid nasal to the fovea OD is stable,, at 11-week interval follow-up today, repeat injection Eylea today examination next 12 weeks OS with advanced dry atrophic ARMD stable, no change in thickness OS, the computer selected lower baseline     Subjective: She is feeling well, she has been able to ambulate to bathroom with assistance. Agree to get dulcolax suppository.   Discharge Exam: Vitals:   08/31/20 0519 08/31/20 0755  BP: 125/80 121/60  Pulse:  84  Resp: 16 17  Temp: 98.8 F (37.1 C) 98.3 F (36.8 C)  SpO2: 92% 92%     General: Pt is alert, awake, not in acute distress Cardiovascular: RRR, S1/S2 +, no rubs, no gallops Respiratory: CTA bilaterally, no wheezing, no rhonchi Abdominal: Soft, NT, ND, bowel sounds + Extremities: no edema, no cyanosis    The results of significant diagnostics from this hospitalization (including imaging, microbiology, ancillary and laboratory) are listed below for reference.     Microbiology: Recent Results (from the past 240 hour(s))  Resp Panel by RT-PCR (Flu A&B, Covid) Nasopharyngeal Swab     Status: None   Collection Time: 08/27/20 11:40 PM   Specimen: Nasopharyngeal Swab; Nasopharyngeal(NP) swabs in vial transport medium  Result Value Ref Range Status   SARS Coronavirus 2 by RT PCR NEGATIVE NEGATIVE Final    Comment: (NOTE) SARS-CoV-2 target nucleic acids are NOT DETECTED.  The SARS-CoV-2 RNA is  generally detectable in upper respiratory specimens during the acute phase of infection. The lowest concentration of SARS-CoV-2 viral copies this assay can detect is 138 copies/mL. A negative result does not preclude SARS-Cov-2 infection and should not be used as the sole basis for treatment or other patient management decisions. A negative result may occur with  improper specimen collection/handling, submission of specimen other than nasopharyngeal swab, presence of viral mutation(s) within the areas targeted by this assay, and inadequate number of viral copies(<138 copies/mL). A negative result must be combined with clinical observations, patient history, and epidemiological information. The expected result is Negative.  Fact Sheet for Patients:  EntrepreneurPulse.com.au  Fact Sheet for Healthcare Providers:  IncredibleEmployment.be  This test is no t yet approved or cleared by the [place] FDA and  has been authorized for detection and/or diagnosis of SARS-CoV-2 by FDA under an Emergency Use Authorization (EUA). This EUA will remain  in effect (meaning this test can be used) for the duration of the COVID-19 declaration under Section 564(b)(1) of the Act, 21 U.S.C.section 360bbb-3(b)(1), unless the authorization is terminated  or revoked sooner.       Influenza A by PCR NEGATIVE NEGATIVE Final   Influenza B by PCR NEGATIVE NEGATIVE Final    Comment: (NOTE) The Xpert Xpress SARS-CoV-2/FLU/RSV plus assay is intended as an aid in the diagnosis of influenza from Nasopharyngeal swab specimens and should not be used as a sole basis for treatment. Nasal washings and aspirates are unacceptable for Xpert Xpress SARS-CoV-2/FLU/RSV testing.  Fact Sheet for Patients: EntrepreneurPulse.com.au  Fact Sheet for Healthcare Providers: IncredibleEmployment.be  This test is not yet approved or cleared by the [place] FDA and has been authorized for detection and/or diagnosis of SARS-CoV-2 by FDA under an Emergency Use Authorization (EUA). This EUA will remain in effect (meaning this test can be used) for the duration of the COVID-19 declaration under Section 564(b)(1) of the Act, 21 U.S.C. section 360bbb-3(b)(1), unless the authorization is terminated or revoked.  Performed at [facility], [place] [address], [place], [place] [address]      Labs: BNP (last 3 results) No results for input(s): BNP in the last 8760 hours. Basic Metabolic Panel: Recent Labs  Lab 08/28/20 0525 08/29/20 0420 08/30/20 0346 08/31/20 0310  NA 136 136 132* 131*  K 4.6 4.4 5.0 4.8  CL 107 105 102 101  CO2 21* 20* 23 23  GLUCOSE 106* 139* 119* 94  BUN 42* 34* 24* 23  CREATININE 2.10* 2.22* 1.74* 1.93*  CALCIUM 7.8* 7.7* 8.0* 7.8*  MG 2.3  --   --   --    Liver Function Tests: Recent Labs  Lab 08/28/20 0525 08/29/20 0420 08/30/20 0346 08/31/20 0310  AST 23 33 40 31  ALT 18 19 21 17   ALKPHOS 72 66 79 75  BILITOT 0.9 0.6 0.9 0.7  PROT 5.0* 5.3* 5.5* 4.8*  ALBUMIN 2.9* 3.0* 3.0* 2.6*   No results for input(s): LIPASE, AMYLASE in the last 168 hours. No results for input(s): AMMONIA in the last 168 hours. CBC: Recent Labs  Lab 08/27/20 1538 08/27/20 2204 08/28/20 0030 08/28/20 0525 08/28/20 0922 08/29/20 0342 08/30/20 0346 08/31/20 1014  WBC 6.9 8.3  --  10.0  --  10.7* 9.8 8.3  NEUTROABS 4.3 4.9  --   --   --  6.9  --   --   HGB 11.0* 9.2*   < > 8.2* 9.0* 8.0* 7.9* 8.9*  HCT 35.7* 29.8*   < > 25.8* 28.9* 24.9* 25.3* 28.7*  MCV 93.0 93.1  --  91.5  --  90.5 93.4 92.3  PLT 215 200  --  183  --  202 175 229   < > = values in this interval not displayed.   Cardiac Enzymes: No results for input(s): CKTOTAL, CKMB, CKMBINDEX, TROPONINI in the last 168 hours. BNP: Invalid input(s): POCBNP CBG: No results for input(s): GLUCAP in the last 168 hours. D-Dimer No results for input(s): DDIMER  in the last 72 hours. Hgb A1c No results for input(s): HGBA1C in the last 72 hours. Lipid Profile No results for input(s): CHOL,  HDL, LDLCALC, TRIG, CHOLHDL, LDLDIRECT in the last 72 hours. Thyroid function studies No results for input(s): TSH, T4TOTAL, T3FREE, THYROIDAB in the last 72 hours.  Invalid input(s): FREET3 Anemia work up No results for input(s): VITAMINB12, FOLATE, FERRITIN, TIBC, IRON, RETICCTPCT in the last 72 hours. Urinalysis    Component Value Date/Time   COLORURINE YELLOW 08/14/2010 [place] 08/14/2010 1245   LABSPEC 1.009 08/14/2010 1245   PHURINE 7.0 08/14/2010 1245   GLUCOSEU NEGATIVE 08/14/2010 1245   HGBUR NEGATIVE 08/14/2010 1245   [place] 08/14/2010 1245   KETONESUR NEGATIVE 08/14/2010 1245   PROTEINUR NEGATIVE 08/14/2010 1245   UROBILINOGEN 0.2 08/14/2010 1245   NITRITE NEGATIVE 08/14/2010 1245   LEUKOCYTESUR NEGATIVE 08/14/2010 1245   Sepsis Labs Invalid input(s): PROCALCITONIN,  WBC,  LACTICIDVEN Microbiology Recent Results (from the past 240 hour(s))  Resp Panel by RT-PCR (Flu A&B, Covid) Nasopharyngeal Swab     Status: None   Collection Time: 08/27/20 11:40 PM   Specimen: Nasopharyngeal Swab; Nasopharyngeal(NP) swabs in vial transport medium  Result Value Ref Range Status   SARS Coronavirus 2 by RT PCR NEGATIVE NEGATIVE Final    Comment: (NOTE) SARS-CoV-2 target nucleic acids are NOT DETECTED.  The SARS-CoV-2 RNA is generally detectable in upper respiratory specimens during the acute phase of infection. The lowest concentration of SARS-CoV-2 viral copies this assay can detect is 138 copies/mL. A negative result does not preclude SARS-Cov-2 infection and should not be used as the sole basis for treatment or other patient management decisions. A negative result may occur with  improper specimen collection/handling, submission of specimen other than nasopharyngeal swab, presence of viral mutation(s) within  the areas targeted by this assay, and inadequate number of viral copies(<138 copies/mL). A negative result must be combined with clinical observations, patient history, and epidemiological information. The expected result is Negative.  Fact Sheet for Patients:  EntrepreneurPulse.com.au  Fact Sheet for Healthcare Providers:  IncredibleEmployment.be  This test is no t yet approved or cleared by the [place] FDA and  has been authorized for detection and/or diagnosis of SARS-CoV-2 by FDA under an Emergency Use Authorization (EUA). This EUA will remain  in effect (meaning this test can be used) for the duration of the COVID-19 declaration under Section 564(b)(1) of the Act, 21 U.S.C.section 360bbb-3(b)(1), unless the authorization is terminated  or revoked sooner.       Influenza A by PCR NEGATIVE NEGATIVE Final   Influenza B by PCR NEGATIVE NEGATIVE Final    Comment: (NOTE) The Xpert Xpress SARS-CoV-2/FLU/RSV plus assay is intended as an aid in the diagnosis of influenza from Nasopharyngeal swab specimens and should not be used as a sole basis for treatment. Nasal washings and aspirates are unacceptable for Xpert Xpress SARS-CoV-2/FLU/RSV testing.  Fact Sheet for Patients: EntrepreneurPulse.com.au  Fact Sheet for Healthcare Providers: IncredibleEmployment.be  This test is not yet approved or cleared by the [place] FDA and has been authorized for detection and/or diagnosis of SARS-CoV-2 by FDA under an Emergency Use Authorization (EUA). This EUA will remain in effect (meaning this test can be used) for the duration of the COVID-19 declaration under Section 564(b)(1) of the Act, 21 U.S.C. section 360bbb-3(b)(1), unless the authorization is terminated or revoked.  Performed at [facility], [place] [address], [place],  [address]      Time coordinating discharge: 40  minutes  SIGNED:   Elmarie Shiley, MD  [facility]

## 2020-08-31 NOTE — Discharge Instructions (Signed)
Local wound care with daily washing and dry dressing. No soaking.

## 2020-09-02 ENCOUNTER — Encounter (HOSPITAL_COMMUNITY): Payer: Self-pay | Admitting: Emergency Medicine

## 2020-09-02 ENCOUNTER — Other Ambulatory Visit: Payer: Self-pay

## 2020-09-02 ENCOUNTER — Emergency Department (HOSPITAL_COMMUNITY)
Admission: EM | Admit: 2020-09-02 | Discharge: 2020-09-02 | Disposition: A | Discharge status: Home / Self Care | Payer: Medicare Other | Attending: Emergency Medicine | Admitting: Emergency Medicine

## 2020-09-02 DIAGNOSIS — D649 Anemia, unspecified: Secondary | ICD-10-CM | POA: Diagnosis not present

## 2020-09-02 DIAGNOSIS — D631 Anemia in chronic kidney disease: Secondary | ICD-10-CM | POA: Diagnosis not present

## 2020-09-02 DIAGNOSIS — R319 Hematuria, unspecified: Secondary | ICD-10-CM | POA: Insufficient documentation

## 2020-09-02 DIAGNOSIS — Z87891 Personal history of nicotine dependence: Secondary | ICD-10-CM | POA: Insufficient documentation

## 2020-09-02 DIAGNOSIS — I959 Hypotension, unspecified: Secondary | ICD-10-CM | POA: Diagnosis not present

## 2020-09-02 DIAGNOSIS — N189 Chronic kidney disease, unspecified: Secondary | ICD-10-CM

## 2020-09-02 DIAGNOSIS — I129 Hypertensive chronic kidney disease with stage 1 through stage 4 chronic kidney disease, or unspecified chronic kidney disease: Secondary | ICD-10-CM | POA: Insufficient documentation

## 2020-09-02 DIAGNOSIS — R339 Retention of urine, unspecified: Secondary | ICD-10-CM

## 2020-09-02 DIAGNOSIS — N184 Chronic kidney disease, stage 4 (severe): Secondary | ICD-10-CM | POA: Diagnosis not present

## 2020-09-02 DIAGNOSIS — Z978 Presence of other specified devices: Secondary | ICD-10-CM

## 2020-09-02 DIAGNOSIS — R531 Weakness: Secondary | ICD-10-CM | POA: Diagnosis not present

## 2020-09-02 LAB — CBC
HCT: 26.4 % — ABNORMAL LOW (ref 36.0–46.0)
Hemoglobin: 8.1 g/dL — ABNORMAL LOW (ref 12.0–15.0)
MCH: 28.6 pg (ref 26.0–34.0)
MCHC: 30.7 g/dL (ref 30.0–36.0)
MCV: 93.3 fL (ref 80.0–100.0)
Platelets: 224 10*3/uL (ref 150–400)
RBC: 2.83 MIL/uL — ABNORMAL LOW (ref 3.87–5.11)
RDW: 16.3 % — ABNORMAL HIGH (ref 11.5–15.5)
WBC: 7.9 10*3/uL (ref 4.0–10.5)
nRBC: 0.4 % — ABNORMAL HIGH (ref 0.0–0.2)

## 2020-09-02 LAB — BASIC METABOLIC PANEL
Anion gap: 7 (ref 5–15)
BUN: 33 mg/dL — ABNORMAL HIGH (ref 8–23)
CO2: 28 mmol/L (ref 22–32)
Calcium: 7.9 mg/dL — ABNORMAL LOW (ref 8.9–10.3)
Chloride: 96 mmol/L — ABNORMAL LOW (ref 98–111)
Creatinine, Ser: 1.96 mg/dL — ABNORMAL HIGH (ref 0.44–1.00)
GFR, Estimated: 23 mL/min — ABNORMAL LOW (ref >60)
Glucose, Bld: 103 mg/dL — ABNORMAL HIGH (ref 70–99)
Potassium: 5.1 mmol/L (ref 3.5–5.1)
Sodium: 131 mmol/L — ABNORMAL LOW (ref 135–145)

## 2020-09-02 LAB — URINE CULTURE: Culture: NO GROWTH

## 2020-09-02 MED ORDER — SODIUM CHLORIDE 0.9 % IV BOLUS
500.0000 mL | Freq: Once | INTRAVENOUS | Status: AC
  Administered 2020-09-02: 500 mL via INTRAVENOUS

## 2020-09-02 NOTE — ED Triage Notes (Signed)
Pt BIB EMS from home c/o weakness, fatigue, and decreased urine output that started this AM. Pt recently hospitalized for a fall and dc'd with a urinary catheter. Emptied catheter 3 times yesterday, and no urine output today. Hypotensive upon arrival to ED.

## 2020-09-02 NOTE — ED Notes (Signed)
ED Provider at bedside. 

## 2020-09-02 NOTE — ED Provider Notes (Signed)
[facility] DEPT Provider Note   CSN: [id number] Arrival date & time: 09/02/20  1739     History Chief Complaint  Patient presents with   Weakness   Fatigue   Dysuria    Allison Norman is a 87 y.o. female.  87 year old female with history as below including atrial fibrillation on anticoagulation.  She presents to the emergency department with complaint of difficulty with urination.  She has indwelling Foley catheter since her prior admission secondary to knee trauma.  Patient has noticed reduced catheter output since this morning.  Tried increasing her water intake but did not notice any further output from her Foley catheter.  No pain to her abdomen, no pain suprapubically.  No pain to catheter site or vaginal area.  Tolerant oral intake without difficulty.  No abdominal pain.  No nausea or vomiting.  No fevers or chills.  No chest pain or dyspnea.  She has been tending to her right knee wound, no purulent drainage, no fevers, no ongoing bleeding.  No worsening pain to the right knee.  She denies acute weakness, reports that she is chronically feeling weak.  No worsening fatigue from baseline.  The history is provided by the patient. No language interpreter was used.  Weakness Associated symptoms: arthralgias   Associated symptoms: no abdominal pain, no chest pain, no cough, no dysuria, no fever, no headaches, no nausea and no shortness of breath   Dysuria Associated symptoms: no abdominal pain, no fever, no flank pain and no nausea       Past Medical History:  Diagnosis Date   Atrial fibrillation ([place]) 2017   a. s/p DCCV in 10/2015  b. recurrent in 08/2016 --> rate-control pursued.    Cancer ([place])    Breast   CKD (chronic kidney disease)    GCA (giant cell arteritis) ([place])    Hordeolum internum left lower eyelid 06/28/2019   Hypertension    Macular degeneration    Osteoporosis    PMR (polymyalgia rheumatica) (HCC)    Resistant hypertension  03/15/2019   Shortness of breath 03/15/2019   Skin cancer     Patient Active Problem List   Diagnosis Date Noted   Laceration of right lower extremity 08/28/2020   CKD (chronic kidney disease), stage IV (HCC)    Acute blood loss anemia 08/27/2020   Advanced nonexudative age-related macular degeneration of right eye without subfoveal involvement 03/15/2020   Pacemaker 11/01/2019   Tachycardia-bradycardia syndrome ([place]) 08/05/2019   Bradycardia 07/05/2019   Meibomian blepharitis, left 06/28/2019   Exudative age-related macular degeneration of right eye with active choroidal neovascularization ([place]) 05/03/2019   Cystoid macular edema of right eye 05/03/2019   Advanced nonexudative age-related macular degeneration of left eye with subfoveal involvement 05/03/2019   Resistant hypertension 03/15/2019   Shortness of breath 03/15/2019   Edema leg 01/19/2019   Fusion beats 08/03/2017   Atypical atrial flutter (HCC)    Persistent atrial fibrillation ([place]) 08/19/2016   On anticoagulant therapy 07/10/2016   Renal insufficiency 12/07/2015   PAF (paroxysmal atrial fibrillation) (HCC)    Atrial fibrillation with RVR ([place]) 08/31/2015   CRI (chronic renal insufficiency), stage 4 (severe) ([place]) 08/31/2015   Essential hypertension 07/16/2009   TEMPORAL ARTERITIS 07/16/2009   Polymyalgia rheumatica ([place]) 07/16/2009   MRSA 07/02/2009   PYOGENIC ARTHRITIS, LOWER LEG 07/02/2009    Past Surgical History:  Procedure Laterality Date   CARDIOVERSION N/A 10/18/2015   Procedure: CARDIOVERSION;  Surgeon: Jerline Pain, MD;  Location:  [facility] ENDOSCOPY;  Service: Cardiovascular;  Laterality: N/A;   CARDIOVERSION N/A 02/20/2017   Procedure: CARDIOVERSION;  Surgeon: Pixie Casino, MD;  Location: [facility] ENDOSCOPY;  Service: Cardiovascular;  Laterality: N/A;   CARDIOVERSION N/A 06/18/2017   Procedure: CARDIOVERSION;  Surgeon: Sanda Klein, MD;  Location: [facility] ENDOSCOPY;  Service: Cardiovascular;  Laterality: N/A;    CARDIOVERSION N/A 12/21/2018   Procedure: CARDIOVERSION;  Surgeon: Thayer Headings, MD;  Location: [facility] ENDOSCOPY;  Service: Cardiovascular;  Laterality: N/A;   KNEE SURGERY  2013   MASTECTOMY  1998   left side   PACEMAKER IMPLANT N/A 07/22/2019   Procedure: PACEMAKER IMPLANT;  Surgeon: Evans Lance, MD;  Location: [facility];  Service: Cardiovascular;  Laterality: N/A;     OB History   No obstetric history on file.     Family History  Problem Relation Age of Onset   Stroke Mother    Kidney failure Father        died at 42   Heart disease Sister        died at 80   Heart disease Sister        died at 68   Breast cancer Daughter     Social History   Tobacco Use   Smoking status: Former    Types: Cigarettes    Quit date: 08/31/1966    Years since quitting: 54.0   Smokeless tobacco: Never  Vaping Use   Vaping Use: Never used  Substance Use Topics   Alcohol use: No   Drug use: No    Home Medications Prior to Admission medications   Medication Sig Start Date End Date Taking? Authorizing Provider  acetaminophen (TYLENOL) 500 MG tablet Take 1,000 mg by mouth every 6 (six) hours as needed for moderate pain or headache.   Yes [provider]  Aflibercept (EYLEA) 2 MG/0.05ML SOLN 2 mg by Intravitreal route every 3 (three) months. Inject in R eye every 11 weeks per Retina specialist for Macular Degenration   Yes [provider]  amiodarone (PACERONE) 200 MG tablet Take 0.5 tablets (100 mg total) by mouth daily. 02/21/20  Yes Sherran Needs, NP  amLODipine (NORVASC) 5 MG tablet Take 1 tablet by mouth once daily Patient taking differently: Take 5 mg by mouth daily. 03/29/20  Yes Skeet Latch, MD  B Complex Vitamins (VITAMIN-B COMPLEX PO) Take 1 tablet by mouth daily.   Yes [provider]  carvedilol (COREG) 3.125 MG tablet Take 1 tablet (3.125 mg total) by mouth 2 (two) times daily with a meal. Patient taking differently: Take 3.125-6.25  mg by mouth See admin instructions. Takes 2 tablets in the morning and 1 tablet at night 03/20/20 09/02/20 Yes Skeet Latch, MD  cloNIDine (CATAPRES) 0.2 MG tablet Take 1 tablet (0.2 mg total) by mouth 2 (two) times daily. 08/31/20  Yes Regalado, Belkys A, MD  diazepam (VALIUM) 2 MG tablet Take 2 mg by mouth at bedtime as needed for anxiety (for sleep). 06/23/16  Yes [provider]  ELIQUIS 2.5 MG TABS tablet Take 1 tablet by mouth twice daily Patient taking differently: Take 2.5 mg by mouth 2 (two) times daily. 05/08/20  Yes Sherran Needs, NP  furosemide (LASIX) 20 MG tablet Take 20 mg by mouth daily.   Yes [provider]  Multiple Vitamins-Minerals (PRESERVISION AREDS 2) CAPS Take 1 capsule by mouth daily.   Yes [provider]  polyethylene glycol (MIRALAX / GLYCOLAX) 17 g packet Take 17 g  by mouth 2 (two) times daily. Patient taking differently: Take 17 g by mouth daily as needed for mild constipation. 08/31/20  Yes Regalado, Belkys A, MD  predniSONE (DELTASONE) 5 MG tablet Take 5 mg by mouth daily as needed (pain). 05/16/20  Yes [provider]  tamsulosin (FLOMAX) 0.4 MG CAPS capsule Take 1 capsule (0.4 mg total) by mouth daily. 08/31/20  Yes Regalado, Belkys A, MD  traMADol (ULTRAM) 50 MG tablet Take 0.5 tablets (25 mg total) by mouth every 6 (six) hours as needed for moderate pain. 08/31/20  Yes Regalado, Belkys A, MD  ferrous sulfate 325 (65 FE) MG tablet Take 1 tablet (325 mg total) by mouth daily. Patient not taking: No sig reported 08/31/20 08/31/21  Regalado, Jerald Kief A, MD  pantoprazole (PROTONIX) 40 MG tablet Take 1 tablet (40 mg total) by mouth 2 (two) times daily. Patient not taking: No sig reported 08/31/20   Regalado, Belkys A, MD  senna (SENOKOT) 8.6 MG TABS tablet Take 1 tablet (8.6 mg total) by mouth 2 (two) times daily. Patient not taking: No sig reported 08/31/20   Regalado, Belkys A, MD    Allergies    Codeine, Penicillins, Doxazosin,  Doxycycline, Doxycycline hyclate, and Hydralazine  Review of Systems   Review of Systems  Constitutional:  Negative for activity change and fever.  HENT:  Negative for facial swelling and trouble swallowing.   Eyes:  Negative for discharge and redness.  Respiratory:  Negative for cough and shortness of breath.   Cardiovascular:  Negative for chest pain and palpitations.  Gastrointestinal:  Negative for abdominal pain and nausea.  Genitourinary:  Positive for decreased urine volume and difficulty urinating. Negative for dysuria and flank pain.  Musculoskeletal:  Positive for arthralgias. Negative for back pain and gait problem.  Skin:  Positive for wound. Negative for pallor and rash.  Neurological:  Negative for syncope and headaches.   Physical Exam Updated Vital Signs BP 121/63 (BP Location: Right Arm)   Pulse 82   Temp 98.6 F (37 C) (Oral)   Resp (!) 27   Ht 5' 5.5" (1.664 m)   Wt 66 kg   SpO2 98%   BMI 23.84 kg/m   Physical Exam Vitals and nursing note reviewed.  Constitutional:      General: She is not in acute distress.    Appearance: Normal appearance.  HENT:     Head: Normocephalic and atraumatic.     Right Ear: External ear normal.     Left Ear: External ear normal.     Nose: Nose normal.     Mouth/Throat:     Mouth: Mucous membranes are moist.  Eyes:     General: No scleral icterus.       Right eye: No discharge.        Left eye: No discharge.  Cardiovascular:     Rate and Rhythm: Normal rate and regular rhythm.     Pulses: Normal pulses.     Heart sounds: Normal heart sounds.  Pulmonary:     Effort: Pulmonary effort is normal. No respiratory distress.     Breath sounds: Normal breath sounds.  Abdominal:     General: Abdomen is flat.     Palpations: Abdomen is soft.     Tenderness: There is no abdominal tenderness. There is no right CVA tenderness or left CVA tenderness.  Genitourinary:    Comments: Foley catheter in place Musculoskeletal:         General: Normal range of motion.  Cervical back: Normal range of motion.     Right lower leg: No edema.     Left lower leg: No edema.       Legs:  Skin:    General: Skin is warm and dry.     Capillary Refill: Capillary refill takes less than 2 seconds.  Neurological:     Mental Status: She is alert and oriented to person, place, and time.     GCS: GCS eye subscore is 4. GCS verbal subscore is 5. GCS motor subscore is 6.  Psychiatric:        Mood and Affect: Mood normal.        Behavior: Behavior normal.    ED Results / Procedures / Treatments   Labs (all labs ordered are listed, but only abnormal results are displayed) Labs Reviewed  CBC - Abnormal; Notable for the following components:      Result Value   RBC 2.83 (*)    Hemoglobin 8.1 (*)    HCT 26.4 (*)    RDW 16.3 (*)    nRBC 0.4 (*)    All other components within normal limits  BASIC METABOLIC PANEL - Abnormal; Notable for the following components:   Sodium 131 (*)    Chloride 96 (*)    Glucose, Bld 103 (*)    BUN 33 (*)    Creatinine, Ser 1.96 (*)    Calcium 7.9 (*)    GFR, Estimated 23 (*)    All other components within normal limits    EKG None  Radiology No results found.  Procedures Procedures   Medications Ordered in ED Medications  sodium chloride 0.9 % bolus 500 mL (0 mLs Intravenous Stopped 09/02/20 2034)    ED Course  I have reviewed the triage vital signs and the nursing notes.  Pertinent labs & imaging results that were available during my care of the patient were reviewed by me and considered in my medical decision making (see chart for details).    MDM Rules/Calculators/A&P                           87 year old female with history as above presented ER secondary to difficulty with urination.  She was indwelling Foley catheter.  Reduced urine output since this morning despite increasing oral intake.  No abdominal pain.  Foley catheter in place.  Vital signs reviewed and are stable.   Serious etiology considered  Bladder scan ordered, flush Foley catheter.  Screening labs to evaluate renal function.   Bladder scan with minimal urine in bladder. Foley appears to be draining appropriately. She was given 548ml of IVF in ED and she has had around 300 ml of UOP while in the ED. No abdominal pain. Hemodynamically stable for duration of evaluation. Labs reviewed and are similar to her baseline. Patient feels she is back to her baseline, requesting to leave. Discussed treatment course with patient and family at bedside. They will see urology for follow up regarding foley and voiding issues.  The patient improved significantly and was discharged in stable condition. Detailed discussions were had with the patient regarding current findings, and need for close f/u with PCP or on call doctor. The patient has been instructed to return immediately if the symptoms worsen in any way for re-evaluation. Patient verbalized understanding and is in agreement with current care plan. All questions answered prior to discharge.   Final Clinical Impression(s) / ED Diagnoses Final diagnoses:  Indwelling  Foley catheter present  Chronic kidney disease, unspecified CKD stage  Urinary retention  Anemia, unspecified type    Rx / DC Orders ED Discharge Orders     None        Jeanell Sparrow, DO 09/03/20 0116

## 2020-09-02 NOTE — ED Notes (Signed)
Patient has no concerns after AVS has been reviewed and patient education provided. Patient discharged. 

## 2020-09-04 ENCOUNTER — Ambulatory Visit (HOSPITAL_BASED_OUTPATIENT_CLINIC_OR_DEPARTMENT_OTHER): Payer: Medicare Other | Admitting: Cardiovascular Disease

## 2020-09-05 ENCOUNTER — Inpatient Hospital Stay (HOSPITAL_COMMUNITY)
Admission: EM | Admit: 2020-09-05 | Discharge: 2020-09-09 | DRG: 308 | Disposition: A | Discharge status: Home Health | Payer: Medicare Other | Attending: Internal Medicine | Admitting: Internal Medicine

## 2020-09-05 ENCOUNTER — Emergency Department (HOSPITAL_COMMUNITY): Payer: Medicare Other

## 2020-09-05 ENCOUNTER — Encounter (HOSPITAL_COMMUNITY): Payer: Self-pay

## 2020-09-05 ENCOUNTER — Other Ambulatory Visit: Payer: Self-pay

## 2020-09-05 DIAGNOSIS — I5033 Acute on chronic diastolic (congestive) heart failure: Secondary | ICD-10-CM | POA: Diagnosis not present

## 2020-09-05 DIAGNOSIS — I480 Paroxysmal atrial fibrillation: Secondary | ICD-10-CM | POA: Diagnosis present

## 2020-09-05 DIAGNOSIS — R000 Tachycardia, unspecified: Secondary | ICD-10-CM | POA: Diagnosis not present

## 2020-09-05 DIAGNOSIS — N179 Acute kidney failure, unspecified: Secondary | ICD-10-CM

## 2020-09-05 DIAGNOSIS — R0902 Hypoxemia: Secondary | ICD-10-CM

## 2020-09-05 DIAGNOSIS — M810 Age-related osteoporosis without current pathological fracture: Secondary | ICD-10-CM | POA: Diagnosis present

## 2020-09-05 DIAGNOSIS — R0602 Shortness of breath: Secondary | ICD-10-CM

## 2020-09-05 DIAGNOSIS — Z880 Allergy status to penicillin: Secondary | ICD-10-CM

## 2020-09-05 DIAGNOSIS — I959 Hypotension, unspecified: Secondary | ICD-10-CM | POA: Diagnosis not present

## 2020-09-05 DIAGNOSIS — L03115 Cellulitis of right lower limb: Secondary | ICD-10-CM | POA: Diagnosis present

## 2020-09-05 DIAGNOSIS — Z9012 Acquired absence of left breast and nipple: Secondary | ICD-10-CM

## 2020-09-05 DIAGNOSIS — N390 Urinary tract infection, site not specified: Secondary | ICD-10-CM | POA: Diagnosis present

## 2020-09-05 DIAGNOSIS — N184 Chronic kidney disease, stage 4 (severe): Secondary | ICD-10-CM | POA: Diagnosis present

## 2020-09-05 DIAGNOSIS — Z79899 Other long term (current) drug therapy: Secondary | ICD-10-CM

## 2020-09-05 DIAGNOSIS — D509 Iron deficiency anemia, unspecified: Secondary | ICD-10-CM | POA: Diagnosis present

## 2020-09-05 DIAGNOSIS — R5383 Other fatigue: Secondary | ICD-10-CM

## 2020-09-05 DIAGNOSIS — Z7952 Long term (current) use of systemic steroids: Secondary | ICD-10-CM

## 2020-09-05 DIAGNOSIS — Z950 Presence of cardiac pacemaker: Secondary | ICD-10-CM

## 2020-09-05 DIAGNOSIS — Z20822 Contact with and (suspected) exposure to covid-19: Secondary | ICD-10-CM | POA: Diagnosis not present

## 2020-09-05 DIAGNOSIS — T83518A Infection and inflammatory reaction due to other urinary catheter, initial encounter: Secondary | ICD-10-CM | POA: Diagnosis present

## 2020-09-05 DIAGNOSIS — I952 Hypotension due to drugs: Secondary | ICD-10-CM | POA: Diagnosis present

## 2020-09-05 DIAGNOSIS — J9601 Acute respiratory failure with hypoxia: Secondary | ICD-10-CM | POA: Diagnosis present

## 2020-09-05 DIAGNOSIS — Z885 Allergy status to narcotic agent status: Secondary | ICD-10-CM

## 2020-09-05 DIAGNOSIS — Z87891 Personal history of nicotine dependence: Secondary | ICD-10-CM

## 2020-09-05 DIAGNOSIS — I4819 Other persistent atrial fibrillation: Principal | ICD-10-CM | POA: Diagnosis present

## 2020-09-05 DIAGNOSIS — J90 Pleural effusion, not elsewhere classified: Secondary | ICD-10-CM | POA: Diagnosis not present

## 2020-09-05 DIAGNOSIS — R531 Weakness: Secondary | ICD-10-CM | POA: Diagnosis not present

## 2020-09-05 DIAGNOSIS — R338 Other retention of urine: Secondary | ICD-10-CM | POA: Diagnosis present

## 2020-09-05 DIAGNOSIS — K5900 Constipation, unspecified: Secondary | ICD-10-CM | POA: Diagnosis present

## 2020-09-05 DIAGNOSIS — Z85828 Personal history of other malignant neoplasm of skin: Secondary | ICD-10-CM

## 2020-09-05 DIAGNOSIS — W19XXXA Unspecified fall, initial encounter: Secondary | ICD-10-CM | POA: Diagnosis not present

## 2020-09-05 DIAGNOSIS — I272 Pulmonary hypertension, unspecified: Secondary | ICD-10-CM | POA: Diagnosis present

## 2020-09-05 DIAGNOSIS — T446X5A Adverse effect of alpha-adrenoreceptor antagonists, initial encounter: Secondary | ICD-10-CM | POA: Diagnosis present

## 2020-09-05 DIAGNOSIS — Z888 Allergy status to other drugs, medicaments and biological substances status: Secondary | ICD-10-CM

## 2020-09-05 DIAGNOSIS — I4891 Unspecified atrial fibrillation: Secondary | ICD-10-CM | POA: Diagnosis present

## 2020-09-05 DIAGNOSIS — Z881 Allergy status to other antibiotic agents status: Secondary | ICD-10-CM

## 2020-09-05 DIAGNOSIS — Z803 Family history of malignant neoplasm of breast: Secondary | ICD-10-CM

## 2020-09-05 DIAGNOSIS — Z96651 Presence of right artificial knee joint: Secondary | ICD-10-CM | POA: Diagnosis present

## 2020-09-05 DIAGNOSIS — N1832 Chronic kidney disease, stage 3b: Secondary | ICD-10-CM | POA: Diagnosis present

## 2020-09-05 DIAGNOSIS — W19XXXD Unspecified fall, subsequent encounter: Secondary | ICD-10-CM | POA: Diagnosis present

## 2020-09-05 DIAGNOSIS — I517 Cardiomegaly: Secondary | ICD-10-CM | POA: Diagnosis not present

## 2020-09-05 DIAGNOSIS — S81011A Laceration without foreign body, right knee, initial encounter: Secondary | ICD-10-CM | POA: Diagnosis present

## 2020-09-05 DIAGNOSIS — D631 Anemia in chronic kidney disease: Secondary | ICD-10-CM | POA: Diagnosis not present

## 2020-09-05 DIAGNOSIS — Y846 Urinary catheterization as the cause of abnormal reaction of the patient, or of later complication, without mention of misadventure at the time of the procedure: Secondary | ICD-10-CM | POA: Diagnosis present

## 2020-09-05 DIAGNOSIS — I10 Essential (primary) hypertension: Secondary | ICD-10-CM | POA: Diagnosis present

## 2020-09-05 DIAGNOSIS — I130 Hypertensive heart and chronic kidney disease with heart failure and stage 1 through stage 4 chronic kidney disease, or unspecified chronic kidney disease: Secondary | ICD-10-CM | POA: Diagnosis present

## 2020-09-05 DIAGNOSIS — Z823 Family history of stroke: Secondary | ICD-10-CM

## 2020-09-05 DIAGNOSIS — Z8249 Family history of ischemic heart disease and other diseases of the circulatory system: Secondary | ICD-10-CM

## 2020-09-05 DIAGNOSIS — H43821 Vitreomacular adhesion, right eye: Secondary | ICD-10-CM | POA: Diagnosis present

## 2020-09-05 DIAGNOSIS — H3530 Unspecified macular degeneration: Secondary | ICD-10-CM | POA: Diagnosis present

## 2020-09-05 DIAGNOSIS — M353 Polymyalgia rheumatica: Secondary | ICD-10-CM | POA: Diagnosis present

## 2020-09-05 DIAGNOSIS — E860 Dehydration: Secondary | ICD-10-CM | POA: Diagnosis present

## 2020-09-05 DIAGNOSIS — Z7901 Long term (current) use of anticoagulants: Secondary | ICD-10-CM

## 2020-09-05 DIAGNOSIS — Z841 Family history of disorders of kidney and ureter: Secondary | ICD-10-CM

## 2020-09-05 LAB — COMPREHENSIVE METABOLIC PANEL
ALT: 20 U/L (ref 0–44)
AST: 25 U/L (ref 15–41)
Albumin: 3.2 g/dL — ABNORMAL LOW (ref 3.5–5.0)
Alkaline Phosphatase: 93 U/L (ref 38–126)
Anion gap: 10 (ref 5–15)
BUN: 34 mg/dL — ABNORMAL HIGH (ref 8–23)
CO2: 25 mmol/L (ref 22–32)
Calcium: 8.1 mg/dL — ABNORMAL LOW (ref 8.9–10.3)
Chloride: 96 mmol/L — ABNORMAL LOW (ref 98–111)
Creatinine, Ser: 1.92 mg/dL — ABNORMAL HIGH (ref 0.44–1.00)
GFR, Estimated: 24 mL/min — ABNORMAL LOW (ref >60)
Glucose, Bld: 119 mg/dL — ABNORMAL HIGH (ref 70–99)
Potassium: 4.3 mmol/L (ref 3.5–5.1)
Sodium: 131 mmol/L — ABNORMAL LOW (ref 135–145)
Total Bilirubin: 1 mg/dL (ref 0.3–1.2)
Total Protein: 6.2 g/dL — ABNORMAL LOW (ref 6.5–8.1)

## 2020-09-05 LAB — FOLATE: Folate: 16.4 ng/mL (ref >5.9)

## 2020-09-05 LAB — LACTIC ACID, PLASMA
Lactic Acid, Venous: 1.1 mmol/L (ref 0.5–1.9)
Lactic Acid, Venous: 0.9 mmol/L (ref 0.5–1.9)

## 2020-09-05 LAB — RETICULOCYTES
Immature Retic Fract: 29.3 % — ABNORMAL HIGH (ref 2.3–15.9)
RBC.: 2.83 MIL/uL — ABNORMAL LOW (ref 3.87–5.11)
Retic Count, Absolute: 116.6 10*3/uL (ref 19.0–186.0)
Retic Ct Pct: 4.1 % — ABNORMAL HIGH (ref 0.4–3.1)

## 2020-09-05 LAB — CBC WITH DIFFERENTIAL/PLATELET
Abs Immature Granulocytes: 0.05 10*3/uL (ref 0.00–0.07)
Basophils Absolute: 0 10*3/uL (ref 0.0–0.1)
Basophils Relative: 1 %
Eosinophils Absolute: 0.1 10*3/uL (ref 0.0–0.5)
Eosinophils Relative: 1 %
HCT: 28.3 % — ABNORMAL LOW (ref 36.0–46.0)
Hemoglobin: 8.5 g/dL — ABNORMAL LOW (ref 12.0–15.0)
Immature Granulocytes: 1 %
Lymphocytes Relative: 10 %
Lymphs Abs: 0.9 10*3/uL (ref 0.7–4.0)
MCH: 28.2 pg (ref 26.0–34.0)
MCHC: 30 g/dL (ref 30.0–36.0)
MCV: 94 fL (ref 80.0–100.0)
Monocytes Absolute: 1.4 10*3/uL — ABNORMAL HIGH (ref 0.1–1.0)
Monocytes Relative: 17 %
Neutro Abs: 5.8 10*3/uL (ref 1.7–7.7)
Neutrophils Relative %: 70 %
Platelets: 307 10*3/uL (ref 150–400)
RBC: 3.01 MIL/uL — ABNORMAL LOW (ref 3.87–5.11)
RDW: 16.2 % — ABNORMAL HIGH (ref 11.5–15.5)
WBC: 8.3 10*3/uL (ref 4.0–10.5)
nRBC: 0.2 % (ref 0.0–0.2)

## 2020-09-05 LAB — IRON AND TIBC
Iron: 15 ug/dL — ABNORMAL LOW (ref 28–170)
Saturation Ratios: 4 % — ABNORMAL LOW (ref 10.4–31.8)
TIBC: 416 ug/dL (ref 250–450)
UIBC: 401 ug/dL

## 2020-09-05 LAB — RESP PANEL BY RT-PCR (FLU A&B, COVID) ARPGX2
Influenza A by PCR: NEGATIVE
Influenza B by PCR: NEGATIVE
SARS Coronavirus 2 by RT PCR: NEGATIVE

## 2020-09-05 LAB — MAGNESIUM: Magnesium: 2.7 mg/dL — ABNORMAL HIGH (ref 1.7–2.4)

## 2020-09-05 LAB — TROPONIN I (HIGH SENSITIVITY)
Troponin I (High Sensitivity): 30 ng/L — ABNORMAL HIGH (ref <18)
Troponin I (High Sensitivity): 28 ng/L — ABNORMAL HIGH (ref <18)

## 2020-09-05 LAB — FERRITIN: Ferritin: 33 ng/mL (ref 11–307)

## 2020-09-05 LAB — BRAIN NATRIURETIC PEPTIDE: B Natriuretic Peptide: 874.2 pg/mL — ABNORMAL HIGH (ref 0.0–100.0)

## 2020-09-05 LAB — CK: Total CK: 87 U/L (ref 38–234)

## 2020-09-05 LAB — VITAMIN B12: Vitamin B-12: 571 pg/mL (ref 180–914)

## 2020-09-05 MED ORDER — ACETAMINOPHEN 500 MG PO TABS
1000.0000 mg | ORAL_TABLET | Freq: Four times a day (QID) | ORAL | Status: DC | PRN
  Administered 2020-09-06 – 2020-09-09 (×4): 1000 mg via ORAL
  Filled 2020-09-05 (×5): qty 2

## 2020-09-05 MED ORDER — POLYETHYLENE GLYCOL 3350 17 G PO PACK: 17.0000 g | PACK | Freq: Every day | ORAL | Status: DC | PRN

## 2020-09-05 MED ORDER — ONDANSETRON HCL 4 MG PO TABS: 4.0000 mg | ORAL_TABLET | Freq: Four times a day (QID) | ORAL | Status: DC | PRN

## 2020-09-05 MED ORDER — SULFAMETHOXAZOLE-TRIMETHOPRIM 800-160 MG PO TABS
1.0000 | ORAL_TABLET | Freq: Two times a day (BID) | ORAL | Status: DC
  Administered 2020-09-05 – 2020-09-06 (×2): 1 via ORAL
  Filled 2020-09-05 (×2): qty 1

## 2020-09-05 MED ORDER — PREDNISONE 5 MG PO TABS
5.0000 mg | ORAL_TABLET | Freq: Every day | ORAL | Status: DC
  Administered 2020-09-06: 5 mg via ORAL
  Filled 2020-09-05 (×5): qty 1

## 2020-09-05 MED ORDER — PANTOPRAZOLE SODIUM 40 MG PO TBEC
40.0000 mg | DELAYED_RELEASE_TABLET | Freq: Two times a day (BID) | ORAL | Status: DC
  Administered 2020-09-05 – 2020-09-09 (×8): 40 mg via ORAL
  Filled 2020-09-05 (×8): qty 1

## 2020-09-05 MED ORDER — CLONIDINE HCL 0.2 MG PO TABS
0.2000 mg | ORAL_TABLET | Freq: Two times a day (BID) | ORAL | Status: DC
  Administered 2020-09-05 – 2020-09-09 (×8): 0.2 mg via ORAL
  Filled 2020-09-05 (×4): qty 1
  Filled 2020-09-05: qty 2
  Filled 2020-09-05 (×3): qty 1

## 2020-09-05 MED ORDER — SODIUM CHLORIDE 0.9 % IV BOLUS
1000.0000 mL | Freq: Once | INTRAVENOUS | Status: AC
  Administered 2020-09-05: 1000 mL via INTRAVENOUS

## 2020-09-05 MED ORDER — ONDANSETRON HCL 4 MG/2ML IJ SOLN: 4.0000 mg | Freq: Four times a day (QID) | INTRAMUSCULAR | Status: DC | PRN

## 2020-09-05 MED ORDER — PREDNISONE 5 MG PO TABS: 5.0000 mg | ORAL_TABLET | Freq: Every day | ORAL | Status: DC

## 2020-09-05 MED ORDER — CARVEDILOL 3.125 MG PO TABS
3.1250 mg | ORAL_TABLET | Freq: Two times a day (BID) | ORAL | Status: DC
  Administered 2020-09-05 – 2020-09-08 (×6): 3.125 mg via ORAL
  Filled 2020-09-05 (×5): qty 1

## 2020-09-05 MED ORDER — TRAMADOL HCL 50 MG PO TABS: 25.0000 mg | ORAL_TABLET | Freq: Four times a day (QID) | ORAL | Status: DC | PRN

## 2020-09-05 MED ORDER — APIXABAN 2.5 MG PO TABS
2.5000 mg | ORAL_TABLET | Freq: Two times a day (BID) | ORAL | Status: DC
  Administered 2020-09-05 – 2020-09-09 (×8): 2.5 mg via ORAL
  Filled 2020-09-05 (×8): qty 1

## 2020-09-05 MED ORDER — SENNA 8.6 MG PO TABS
1.0000 | ORAL_TABLET | Freq: Two times a day (BID) | ORAL | Status: DC
  Administered 2020-09-06 – 2020-09-09 (×4): 8.6 mg via ORAL
  Filled 2020-09-05 (×5): qty 1

## 2020-09-05 MED ORDER — DIAZEPAM 2 MG PO TABS
2.0000 mg | ORAL_TABLET | Freq: Every evening | ORAL | Status: DC | PRN
  Administered 2020-09-05 – 2020-09-08 (×4): 2 mg via ORAL
  Filled 2020-09-05 (×5): qty 1

## 2020-09-05 MED ORDER — AMIODARONE HCL 100 MG PO TABS
100.0000 mg | ORAL_TABLET | Freq: Every day | ORAL | Status: DC
  Administered 2020-09-06 – 2020-09-09 (×4): 100 mg via ORAL
  Filled 2020-09-05 (×4): qty 1

## 2020-09-05 MED ORDER — SODIUM CHLORIDE 0.9 % IV SOLN: INTRAVENOUS | Status: DC

## 2020-09-05 NOTE — ED Provider Notes (Addendum)
[facility] DEPT Provider Note   CSN: [id number] Arrival date & time: 09/05/20  1413     History Chief Complaint  Patient presents with   Fatigue    Allison Norman is a 87 y.o. female.  Patient with history of atrial fibrillation on Eliquis and recent urinary retention with Foley catheter placement, returns again to the ER complaining of generalized fatigue and decreased energy.  She states she does not have the energy to get out of bed feels very fatigued and tired.  Has no complaints of headache or chest pain or abdominal pain.  No complaints of fevers or cough or vomiting or diarrhea.  States that she is too weak to do anything at home.      Past Medical History:  Diagnosis Date   Atrial fibrillation ([place]) 2017   a. s/p DCCV in 10/2015  b. recurrent in 08/2016 --> rate-control pursued.    Cancer ([place])    Breast   CKD (chronic kidney disease)    GCA (giant cell arteritis) (HCC)    Hordeolum internum left lower eyelid 06/28/2019   Hypertension    Macular degeneration    Osteoporosis    PMR (polymyalgia rheumatica) (HCC)    Resistant hypertension 03/15/2019   Shortness of breath 03/15/2019   Skin cancer     Patient Active Problem List   Diagnosis Date Noted   Laceration of right lower extremity 08/28/2020   CKD (chronic kidney disease), stage IV (HCC)    Acute blood loss anemia 08/27/2020   Advanced nonexudative age-related macular degeneration of right eye without subfoveal involvement 03/15/2020   Pacemaker 11/01/2019   Tachycardia-bradycardia syndrome ([place]) 08/05/2019   Bradycardia 07/05/2019   Meibomian blepharitis, left 06/28/2019   Exudative age-related macular degeneration of right eye with active choroidal neovascularization ([place]) 05/03/2019   Cystoid macular edema of right eye 05/03/2019   Advanced nonexudative age-related macular degeneration of left eye with subfoveal involvement 05/03/2019   Resistant hypertension 03/15/2019    Shortness of breath 03/15/2019   Edema leg 01/19/2019   Fusion beats 08/03/2017   Atypical atrial flutter (HCC)    Persistent atrial fibrillation ([place]) 08/19/2016   On anticoagulant therapy 07/10/2016   Renal insufficiency 12/07/2015   PAF (paroxysmal atrial fibrillation) (HCC)    Atrial fibrillation with RVR ([place]) 08/31/2015   CRI (chronic renal insufficiency), stage 4 (severe) ([place]) 08/31/2015   Essential hypertension 07/16/2009   TEMPORAL ARTERITIS 07/16/2009   Polymyalgia rheumatica ([place]) 07/16/2009   MRSA 07/02/2009   PYOGENIC ARTHRITIS, LOWER LEG 07/02/2009    Past Surgical History:  Procedure Laterality Date   CARDIOVERSION N/A 10/18/2015   Procedure: CARDIOVERSION;  Surgeon: Jerline Pain, MD;  Location: [facility];  Service: Cardiovascular;  Laterality: N/A;   CARDIOVERSION N/A 02/20/2017   Procedure: CARDIOVERSION;  Surgeon: Pixie Casino, MD;  Location[facility] ENDOSCOPY;  Service: Cardiovascular;  Laterality: N/A;   CARDIOVERSION N/A 06/18/2017   Procedure: CARDIOVERSION;  Surgeon: Sanda Klein, MD;  Location: [facility] ENDOSCOPY;  Service: Cardiovascular;  Laterality: N/A;   CARDIOVERSION N/A 12/21/2018   Procedure: CARDIOVERSION;  Surgeon: Thayer Headings, MD;  Location: [facility] ENDOSCOPY;  Service: Cardiovascular;  Laterality: N/A;   KNEE SURGERY  2013   MASTECTOMY  1998   left side   PACEMAKER IMPLANT N/A 07/22/2019   Procedure: PACEMAKER IMPLANT;  Surgeon: Evans Lance, MD;  Location: [facility];  Service: Cardiovascular;  Laterality: N/A;     OB History   No obstetric history  on file.     Family History  Problem Relation Age of Onset   Stroke Mother    Kidney failure Father        died at 41   Heart disease Sister        died at 52   Heart disease Sister        died at 54   Breast cancer Daughter     Social History   Tobacco Use   Smoking status: Former    Types: Cigarettes    Quit date: 08/31/1966    Years since quitting: 54.0   Smokeless  tobacco: Never  Vaping Use   Vaping Use: Never used  Substance Use Topics   Alcohol use: No   Drug use: No    Home Medications Prior to Admission medications   Medication Sig Start Date End Date Taking? Authorizing Provider  acetaminophen (TYLENOL) 500 MG tablet Take 1,000 mg by mouth every 6 (six) hours as needed for moderate pain or headache.    [provider]  Aflibercept (EYLEA) 2 MG/0.05ML SOLN 2 mg by Intravitreal route every 3 (three) months. Inject in R eye every 11 weeks per Retina specialist for Macular Degenration    [provider]  amiodarone (PACERONE) 200 MG tablet Take 0.5 tablets (100 mg total) by mouth daily. 02/21/20   Sherran Needs, NP  amLODipine (NORVASC) 5 MG tablet Take 1 tablet by mouth once daily Patient taking differently: Take 5 mg by mouth daily. 03/29/20   Skeet Latch, MD  B Complex Vitamins (VITAMIN-B COMPLEX PO) Take 1 tablet by mouth daily.    [provider]  carvedilol (COREG) 3.125 MG tablet Take 1 tablet (3.125 mg total) by mouth 2 (two) times daily with a meal. Patient taking differently: Take 3.125-6.25 mg by mouth See admin instructions. Takes 2 tablets in the morning and 1 tablet at night 03/20/20 09/02/20  Skeet Latch, MD  cloNIDine (CATAPRES) 0.2 MG tablet Take 1 tablet (0.2 mg total) by mouth 2 (two) times daily. 08/31/20   [place]al[place], Belkys A, MD  diazepam (VALIUM) 2 MG tablet Take 2 mg by mouth at bedtime as needed for anxiety (for sleep). 06/23/16   [provider]  ELIQUIS 2.5 MG TABS tablet Take 1 tablet by mouth twice daily Patient taking differently: Take 2.5 mg by mouth 2 (two) times daily. 05/08/20   Sherran Needs, NP  ferrous sulfate 325 (65 FE) MG tablet Take 1 tablet (325 mg total) by mouth daily. Patient not taking: No sig reported 08/31/20 08/31/21  Regalado, Jerald Kief A, MD  furosemide (LASIX) 20 MG tablet Take 20 mg by mouth daily.    [provider]  Multiple Vitamins-Minerals  (PRESERVISION AREDS 2) CAPS Take 1 capsule by mouth daily.    [provider]  pantoprazole (PROTONIX) 40 MG tablet Take 1 tablet (40 mg total) by mouth 2 (two) times daily. Patient not taking: No sig reported 08/31/20   Regalado, Belkys A, MD  polyethylene glycol (MIRALAX / GLYCOLAX) 17 g packet Take 17 g by mouth 2 (two) times daily. Patient taking differently: Take 17 g by mouth daily as needed for mild constipation. 08/31/20   Regalado, Belkys A, MD  predniSONE (DELTASONE) 5 MG tablet Take 5 mg by mouth daily as needed (pain). 05/16/20   [provider]  senna (SENOKOT) 8.6 MG TABS tablet Take 1 tablet (8.6 mg total) by mouth 2 (two) times daily. Patient not taking: No sig reported 08/31/20  Regalado, Belkys A, MD  tamsulosin (FLOMAX) 0.4 MG CAPS capsule Take 1 capsule (0.4 mg total) by mouth daily. 08/31/20   Regalado, Belkys A, MD  traMADol (ULTRAM) 50 MG tablet Take 0.5 tablets (25 mg total) by mouth every 6 (six) hours as needed for moderate pain. 08/31/20   Regalado, Belkys A, MD    Allergies    Codeine, Penicillins, Doxazosin, Doxycycline, Doxycycline hyclate, and Hydralazine  Review of Systems   Review of Systems  Constitutional:  Negative for fever.  HENT:  Negative for ear pain.   Eyes:  Negative for pain.  Respiratory:  Negative for cough.   Cardiovascular:  Negative for chest pain.  Gastrointestinal:  Negative for abdominal pain.  Genitourinary:  Negative for flank pain.  Musculoskeletal:  Negative for back pain.  Skin:  Negative for rash.  Neurological:  Negative for headaches.   Physical Exam Updated Vital Signs BP (!) 117/58   Pulse (!) 125   Temp 98 F (36.7 C) (Oral)   Resp 20   Ht 5\' 5"  (1.651 m)   Wt 63.5 kg   SpO2 93%   BMI 23.30 kg/m   Physical Exam Constitutional:      General: She is not in acute distress.    Appearance: Normal appearance.  HENT:     Head: Normocephalic.     Nose: Nose normal.  Eyes:     Extraocular Movements:  Extraocular movements intact.  Cardiovascular:     Rate and Rhythm: Tachycardia present.  Pulmonary:     Effort: Pulmonary effort is normal.  Musculoskeletal:        General: Normal range of motion.     Cervical back: Normal range of motion.  Neurological:     General: No focal deficit present.     Mental Status: She is alert. Mental status is at baseline.    ED Results / Procedures / Treatments   Labs (all labs ordered are listed, but only abnormal results are displayed) Labs Reviewed  CBC WITH DIFFERENTIAL/PLATELET - Abnormal; Notable for the following components:      Result Value   RBC 3.01 (*)    Hemoglobin 8.5 (*)    HCT 28.3 (*)    RDW 16.2 (*)    Monocytes Absolute 1.4 (*)    All other components within normal limits  BRAIN NATRIURETIC PEPTIDE - Abnormal; Notable for the following components:   B Natriuretic Peptide 874.2 (*)    All other components within normal limits  COMPREHENSIVE METABOLIC PANEL - Abnormal; Notable for the following components:   Sodium 131 (*)    Chloride 96 (*)    Glucose, Bld 119 (*)    BUN 34 (*)    Creatinine, Ser 1.92 (*)    Calcium 8.1 (*)    Total Protein 6.2 (*)    Albumin 3.2 (*)    GFR, Estimated 24 (*)    All other components within normal limits  MAGNESIUM - Abnormal; Notable for the following components:   Magnesium 2.7 (*)    All other components within normal limits  TROPONIN I (HIGH SENSITIVITY) - Abnormal; Notable for the following components:   Troponin I (High Sensitivity) 30 (*)    All other components within normal limits  CULTURE, BLOOD (ROUTINE X 2)  CULTURE, BLOOD (ROUTINE X 2)  RESP PANEL BY RT-PCR (FLU A&B, COVID) ARPGX2  LACTIC ACID, PLASMA  CK  LACTIC ACID, PLASMA  URINALYSIS, ROUTINE W REFLEX MICROSCOPIC  TROPONIN I (HIGH SENSITIVITY)  EKG None  Radiology DG Chest Port 1 View  Result Date: 09/05/2020 CLINICAL DATA:  Generalized weakness, fatigue EXAM: PORTABLE CHEST 1 VIEW COMPARISON:   07/22/2019 FINDINGS: Rotated examination. Cardiomegaly with left chest multi lead pacer. Small, layering bilateral pleural effusions. IMPRESSION: 1. Small, layering bilateral pleural effusions. 2. Cardiomegaly. Electronically Signed   By: Eddie Candle M.D.   On: 09/05/2020 15:19    Procedures .Critical Care  Date/Time: 09/05/2020 5:45 PM Performed by: Luna Fuse, MD Authorized by: Luna Fuse, MD   Critical care provider statement:    Critical care time (minutes):  30   Critical care time was exclusive of:  Separately billable procedures and treating other patients and teaching time   Critical care was necessary to treat or prevent imminent or life-threatening deterioration of the following conditions:  Respiratory failure   Medications Ordered in ED Medications  sodium chloride 0.9 % bolus 1,000 mL (0 mLs Intravenous Stopped 09/05/20 1729)    ED Course  I have reviewed the triage vital signs and the nursing notes.  Pertinent labs & imaging results that were available during my care of the patient were reviewed by me and considered in my medical decision making (see chart for details).    MDM Rules/Calculators/A&P                           Patient has no complaint of chest pain, however troponin is elevated at 30.  Sinus rhythm seen on EKG tachycardic but no ST elevations or depressions noted.  Repeat troponin is pending.  Urinalysis is pending given persistent tachycardia of unclear etiology.  No fevers seen in the ER or reported by patient.  Patient presented to oncoming provider pending repeat troponin and urinalysis. Final Clinical Impression(s) / ED Diagnoses Final diagnoses:  Fatigue, unspecified type  Hypoxemia  Pleural effusion    Rx / DC Orders ED Discharge Orders     None        Luna Fuse, MD 09/05/20 1719    Luna Fuse, MD 09/05/20 1745

## 2020-09-05 NOTE — ED Triage Notes (Signed)
Patient brought in via ems from home. Patient was last seen on the 28th for generalized weakness and fatigue that has been on going for a few days now. States she had a hgb of 8 and received a blood transfusion. Also received an foley cath for "blocked urine output". VSS. Denies pain. A&Ox4

## 2020-09-05 NOTE — H&P (Addendum)
TRH H&P    Patient Demographics:    Allison [place], is a 87 y.o. female  MRN: [id number]  DOB - 09/18/1924  Admit Date - 09/05/2020  Referring MD/NP/PA: Dr Almyra Free  Outpatient Primary MD for the patient is [name], MD  Patient coming from: Home  Chief complaint-generalized weakness   HPI:    Allison [place]  is a 87 y.o. female, with history of paroxysmal atrial fibrillation on anticoagulation with Eliquis, hypertension, polymyalgia rheumatica on chronic prednisone therapy who was recently discharged from the hospital on 08/31/2020, after she had a fall with laceration of right knee.  At that time she developed urinary retention and was started on Flomax, Foley catheter was placed.  Today came to the ED with complaints of generalized fatigue and decreased energy.  Patient states that she did not take her medications this morning as her blood pressure was soft.  Patient takes Catapres, Coreg, amiodarone at home.  She was also recently started on Flomax 0.4 mg daily.  In the ED patient was found to be in A. fib with RVR, BNP elevated at 874, initial troponin 30.  Chest x-ray was unremarkable except for small layering bilateral pleural effusions  She denies chest pain Complains of mild shortness of breath Denies nausea vomiting or diarrhea She was constipated for past few days however constipation has resolved      Review of systems:    In addition to the HPI above,    All other systems reviewed and are negative.    Past History of the following :    Past Medical History:  Diagnosis Date   Atrial fibrillation ([place]) 2017   a. s/p DCCV in 10/2015  b. recurrent in 08/2016 --> rate-control pursued.    Cancer ([place])    Breast   CKD (chronic kidney disease)    GCA (giant cell arteritis) (HCC)    Hordeolum internum left lower eyelid 06/28/2019   Hypertension    Macular degeneration    Osteoporosis     PMR (polymyalgia rheumatica) (HCC)    Resistant hypertension 03/15/2019   Shortness of breath 03/15/2019   Skin cancer       Past Surgical History:  Procedure Laterality Date   CARDIOVERSION N/A 10/18/2015   Procedure: CARDIOVERSION;  Surgeon: Jerline Pain, MD;  Location: [facility];  Service: Cardiovascular;  Laterality: N/A;   CARDIOVERSION N/A 02/20/2017   Procedure: CARDIOVERSION;  Surgeon: Pixie Casino, MD;  Location: [facility];  Service: Cardiovascular;  Laterality: N/A;   CARDIOVERSION N/A 06/18/2017   Procedure: CARDIOVERSION;  Surgeon: Sanda Klein, MD;  Location: [facility];  Service: Cardiovascular;  Laterality: N/A;   CARDIOVERSION N/A 12/21/2018   Procedure: CARDIOVERSION;  Surgeon: Thayer Headings, MD;  Location: [facility];  Service: Cardiovascular;  Laterality: N/A;   KNEE SURGERY  2013   MASTECTOMY  1998   left side   PACEMAKER IMPLANT N/A 07/22/2019   Procedure: PACEMAKER IMPLANT;  Surgeon: Evans Lance, MD;  Location: [facility];  Service: Cardiovascular;  Laterality: N/A;  Social History:      Social History   Tobacco Use   Smoking status: Former    Types: Cigarettes    Quit date: 08/31/1966    Years since quitting: 54.0   Smokeless tobacco: Never  Substance Use Topics   Alcohol use: No       Family History :     Family History  Problem Relation Age of Onset   Stroke Mother    Kidney failure Father        died at 26   Heart disease Sister        died at 67   Heart disease Sister        died at 48   Breast cancer Daughter       Home Medications:   Prior to Admission medications   Medication Sig Start Date End Date Taking? Authorizing Provider  acetaminophen (TYLENOL) 500 MG tablet Take 1,000 mg by mouth every 6 (six) hours as needed for moderate pain or headache.    [provider]  Aflibercept (EYLEA) 2 MG/0.05ML SOLN 2 mg by Intravitreal route every 3 (three) months. Inject in R eye every 11 weeks  per Retina specialist for Macular Degenration    [provider]  amiodarone (PACERONE) 200 MG tablet Take 0.5 tablets (100 mg total) by mouth daily. 02/21/20   Sherran Needs, NP  amLODipine (NORVASC) 5 MG tablet Take 1 tablet by mouth once daily Patient taking differently: Take 5 mg by mouth daily. 03/29/20   Skeet Latch, MD  B Complex Vitamins (VITAMIN-B COMPLEX PO) Take 1 tablet by mouth daily.    [provider]  carvedilol (COREG) 3.125 MG tablet Take 1 tablet (3.125 mg total) by mouth 2 (two) times daily with a meal. Patient taking differently: Take 3.125-6.25 mg by mouth See admin instructions. Takes 2 tablets in the morning and 1 tablet at night 03/20/20 09/02/20  Skeet Latch, MD  cloNIDine (CATAPRES) 0.2 MG tablet Take 1 tablet (0.2 mg total) by mouth 2 (two) times daily. 08/31/20   Regalado, Belkys A, MD  diazepam (VALIUM) 2 MG tablet Take 2 mg by mouth at bedtime as needed for anxiety (for sleep). 06/23/16   [provider]  ELIQUIS 2.5 MG TABS tablet Take 1 tablet by mouth twice daily Patient taking differently: Take 2.5 mg by mouth 2 (two) times daily. 05/08/20   Sherran Needs, NP  ferrous sulfate 325 (65 FE) MG tablet Take 1 tablet (325 mg total) by mouth daily. Patient not taking: No sig reported 08/31/20 08/31/21  Regalado, Jerald Kief A, MD  furosemide (LASIX) 20 MG tablet Take 20 mg by mouth daily.    [provider]  Multiple Vitamins-Minerals (PRESERVISION AREDS 2) CAPS Take 1 capsule by mouth daily.    [provider]  pantoprazole (PROTONIX) 40 MG tablet Take 1 tablet (40 mg total) by mouth 2 (two) times daily. Patient not taking: No sig reported 08/31/20   Regalado, Belkys A, MD  polyethylene glycol (MIRALAX / GLYCOLAX) 17 g packet Take 17 g by mouth 2 (two) times daily. Patient taking differently: Take 17 g by mouth daily as needed for mild constipation. 08/31/20   Regalado, Belkys A, MD  predniSONE (DELTASONE) 5 MG tablet Take  5 mg by mouth daily as needed (pain). 05/16/20   [provider]  senna (SENOKOT) 8.6 MG TABS tablet Take 1 tablet (8.6 mg total) by mouth 2 (two) times daily. Patient not taking: No sig reported 08/31/20  Regalado, Belkys A, MD  tamsulosin (FLOMAX) 0.4 MG CAPS capsule Take 1 capsule (0.4 mg total) by mouth daily. 08/31/20   Regalado, Belkys A, MD  traMADol (ULTRAM) 50 MG tablet Take 0.5 tablets (25 mg total) by mouth every 6 (six) hours as needed for moderate pain. 08/31/20   Regalado, Cassie Freer, MD     Allergies:     Allergies  Allergen Reactions   Codeine Itching   Penicillins Itching, Rash and Other (See Comments)    Has patient had a PCN reaction causing immediate rash, facial/tongue/throat swelling, SOB or lightheadedness with hypotension: Yes Has patient had a PCN reaction causing severe rash involving mucus membranes or skin necrosis: No Has patient had a PCN reaction that required hospitalization: No Has patient had a PCN reaction occurring within the last 10 years: No If all of the above answers are "NO", then may proceed with Cephalosporin use.   Doxazosin     Heavy feeling in chest, congestion, wheezing   Doxycycline Other (See Comments)    Cause chest tightness   Doxycycline Hyclate Other (See Comments)   Hydralazine     Felt bad and could hear pulse in head      Physical Exam:   Vitals  Blood pressure 129/75, pulse (!) 125, temperature 98 F (36.7 C), temperature source Oral, resp. rate 17, height 5\' 5"  (1.651 m), weight 63.5 kg, SpO2 93 %.  1.  General: Appears in no acute distress  2. Psychiatric: Alert, oriented x3, intact insight and judgment  3. Neurologic: Cranial nerves II through XII grossly intact, no focal deficit noted  4. HEENMT:  Atraumatic normocephalic, extraocular muscles are intact  5. Respiratory : Clear to auscultation bilaterally  6. Cardiovascular : S1-S2, regular, no murmur auscultated  7. Gastrointestinal:  Abdominal  soft, nontender, no organomegaly  8. Skin:  Right lower extremity is warm to touch, tender to palpation, superficial wound noted on right knee       Data Review:    CBC Recent Labs  Lab 08/30/20 0346 08/31/20 1014 09/02/20 1835 09/05/20 1530  WBC 9.8 8.3 7.9 8.3  HGB 7.9* 8.9* 8.1* 8.5*  HCT 25.3* 28.7* 26.4* 28.3*  PLT 175 229 224 307  MCV 93.4 92.3 93.3 94.0  MCH 29.2 28.6 28.6 28.2  MCHC 31.2 31.0 30.7 30.0  RDW 16.2* 16.5* 16.3* 16.2*  LYMPHSABS  --   --   --  0.9  MONOABS  --   --   --  1.4*  EOSABS  --   --   --  0.1  BASOSABS  --   --   --  0.0   ------------------------------------------------------------------------------------------------------------------  Results for orders placed or performed during the hospital encounter of 09/05/20 (from the past 48 hour(s))  CBC with Differential     Status: Abnormal   Collection Time: 09/05/20  3:30 PM  Result Value Ref Range   WBC 8.3 4.0 - 10.5 K/uL   RBC 3.01 (L) 3.87 - 5.11 MIL/uL   Hemoglobin 8.5 (L) 12.0 - 15.0 g/dL   HCT 28.3 (L) 36.0 - 46.0 %   MCV 94.0 80.0 - 100.0 fL   MCH 28.2 26.0 - 34.0 pg   MCHC 30.0 30.0 - 36.0 g/dL   RDW 16.2 (H) 11.5 - 15.5 %   Platelets 307 150 - 400 K/uL   nRBC 0.2 0.0 - 0.2 %   Neutrophils Relative % 70 %   Neutro Abs 5.8 1.7 - 7.7 K/uL   Lymphocytes Relative  10 %   Lymphs Abs 0.9 0.7 - 4.0 K/uL   Monocytes Relative 17 %   Monocytes Absolute 1.4 (H) 0.1 - 1.0 K/uL   Eosinophils Relative 1 %   Eosinophils Absolute 0.1 0.0 - 0.5 K/uL   Basophils Relative 1 %   Basophils Absolute 0.0 0.0 - 0.1 K/uL   Immature Granulocytes 1 %   Abs Immature Granulocytes 0.05 0.00 - 0.07 K/uL    Comment: Performed at [facility], [place] [address], [place], [place] [address]  Troponin I (High Sensitivity)     Status: Abnormal   Collection Time: 09/05/20  3:30 PM  Result Value Ref Range   Troponin I (High Sensitivity) 30 (H) <18 ng/L    Comment: (NOTE) Elevated high  sensitivity troponin I (hsTnI) values and significant  changes across serial measurements may suggest ACS but many other  chronic and acute conditions are known to elevate hsTnI results.  Refer to the "Links" section for chest pain algorithms and additional  guidance. Performed at [facility], [place] [address] [place], [place] [address]   Lactic acid, plasma     Status: None   Collection Time: 09/05/20  3:30 PM  Result Value Ref Range   Lactic Acid, Venous 1.1 0.5 - 1.9 mmol/L    Comment: Performed at [facility], [place] [address], [place], [place] [address]  Brain natriuretic peptide     Status: Abnormal   Collection Time: 09/05/20  3:30 PM  Result Value Ref Range   B Natriuretic Peptide 874.2 (H) 0.0 - 100.0 pg/mL    Comment: Performed at [facility], [place] [address] [place], [place] [address]  Comprehensive metabolic panel     Status: Abnormal   Collection Time: 09/05/20  3:30 PM  Result Value Ref Range   Sodium 131 (L) 135 - 145 mmol/L   Potassium 4.3 3.5 - 5.1 mmol/L   Chloride 96 (L) 98 - 111 mmol/L   CO2 25 22 - 32 mmol/L   Glucose, Bld 119 (H) 70 - 99 mg/dL    Comment: Glucose reference range applies only to samples taken after fasting for at least 8 hours.   BUN 34 (H) 8 - 23 mg/dL   Creatinine, Ser 1.92 (H) 0.44 - 1.00 mg/dL   Calcium 8.1 (L) 8.9 - 10.3 mg/dL   Total Protein 6.2 (L) 6.5 - 8.1 g/dL   Albumin 3.2 (L) 3.5 - 5.0 g/dL   AST 25 15 - 41 U/L   ALT 20 0 - 44 U/L   Alkaline Phosphatase 93 38 - 126 U/L   Total Bilirubin 1.0 0.3 - 1.2 mg/dL   GFR, Estimated 24 (L) >60 mL/min    Comment: (NOTE) Calculated using the CKD-EPI Creatinine Equation (2021)    Anion gap 10 5 - 15    Comment: Performed at [facility], [place] [address] [place], [place] [address]  Magnesium     Status: Abnormal   Collection Time: 09/05/20  3:30 PM  Result Value Ref Range   Magnesium 2.7 (H) 1.7 - 2.4 mg/dL     Comment: Performed at [facility], [place] [address] [place], [place] [address]  CK     Status: None   Collection Time: 09/05/20  3:30 PM  Result Value Ref Range   Total CK 87 38 - 234 U/L    Comment: Performed at [facility], [place] [address], [place], [place] [address]  Chemistries  Recent Labs  Lab 08/30/20 0346 08/31/20 0310 09/02/20 1835 09/05/20 1530  NA 132* 131* 131* 131*  K 5.0 4.8 5.1 4.3  CL 102 101 96* 96*  CO2 23 23 28 25   GLUCOSE 119* 94 103* 119*  BUN 24* 23 33* 34*  CREATININE 1.74* 1.93* 1.96* 1.92*  CALCIUM 8.0* 7.8* 7.9* 8.1*  MG  --   --   --  2.7*  AST 40 31  --  25  ALT 21 17  --  20  ALKPHOS 79 75  --  93  BILITOT 0.9 0.7  --  1.0   ------------------------------------------------------------------------------------------------------------------  ------------------------------------------------------------------------------------------------------------------ GFR: Estimated Creatinine Clearance: 16.1 mL/min (A) (by C-G formula based on SCr of 1.92 mg/dL (H)). Liver Function Tests: Recent Labs  Lab 08/30/20 0346 08/31/20 0310 09/05/20 1530  AST 40 31 25  ALT 21 17 20   ALKPHOS 79 75 93  BILITOT 0.9 0.7 1.0  PROT 5.5* 4.8* 6.2*  ALBUMIN 3.0* 2.6* 3.2*   Cardiac Enzymes: Recent Labs  Lab 09/05/20 1530  CKTOTAL 87    --------------------------------------------------------------------------------------------------------------- Urine analysis:    Component Value Date/Time   COLORURINE COLORLESS (A) 08/31/2020 1716   APPEARANCEUR CLEAR 08/31/2020 1716   LABSPEC 1.003 (L) 08/31/2020 1716   PHURINE 6.0 08/31/2020 1716   GLUCOSEU NEGATIVE 08/31/2020 1716   HGBUR NEGATIVE 08/31/2020 [place] 08/31/2020 [place] 08/31/2020 1716   PROTEINUR NEGATIVE 08/31/2020 1716   UROBILINOGEN 0.2 08/14/2010 1245   NITRITE NEGATIVE 08/31/2020 1716   LEUKOCYTESUR NEGATIVE  08/31/2020 1716      Imaging Results:    DG Chest Port 1 View  Result Date: 09/05/2020 CLINICAL DATA:  Generalized weakness, fatigue EXAM: PORTABLE CHEST 1 VIEW COMPARISON:  07/22/2019 FINDINGS: Rotated examination. Cardiomegaly with left chest multi lead pacer. Small, layering bilateral pleural effusions. IMPRESSION: 1. Small, layering bilateral pleural effusions. 2. Cardiomegaly. Electronically Signed   By: Eddie Candle M.D.   On: 09/05/2020 15:19    My personal review of EKG: Rhythm atrial fibrillation with RVR   Assessment & Plan:    Active Problems:   Atrial fibrillation (HCC)   Generalized weakness-multifactorial, likely lower extremity cellulitis, also hypotension from Flomax.  Patient already takes clonidine at home, she also takes Coreg, amiodarone.  Patient started on Bactrim 1 tablet p.o. twice daily for lower extremity cellulitis, Flomax will be discontinued. Acute hypoxemic respiratory failure-patient requiring 2 L/min of oxygen, usually she is not on oxygen at home.  Chest x-ray shows small bilateral pleural effusions.  Hypoxemia is likely from underlying A. fib with RVR.  She does not have crackles on exam, chest x-ray shows no pulmonary edema.  Continue oxygen via nasal cannula.  Will restart home medications and observe. Laceration of right knee/mild cellulitis of lower extremity-patient has allergy to penicillin, will start Bactrim 1 tablet p.o. twice daily for 5 days. Atrial fibrillation with RVR-patient has heart rate in 120s, likely from stopping her medications.  Patient did not take her medications today.  We will restart Coreg, amiodarone.  Continue anticoagulation with Eliquis. Hypertension-patient takes clonidine 0.2 mg p.o. twice daily, Coreg 3.125 mg twice daily, amlodipine 5 mg daily.  We will hold amlodipine and restart Coreg and clonidine.  Chronic diastolic CHF-patient has HFpEF, BNP elevated at 874 however patient does not appear to be in fluid overload.  In  fact she appears dry.  She takes Lasix 20 mg daily at home, will hold Lasix at this time.  Chest x-ray does not show pulmonary edema. CKD stage IV with urinary retention-patient developed urinary retention during previous admission requiring Foley catheter placement.  Plan was to follow-up with urologist on discharge.  Flomax was started which has been discontinued due to hypotension and generalized weakness. Polymyalgia rheumatica-continue prednisone 5 mg daily. Chronic anemia-hemoglobin stable at 8.5.  We will check anemia panel    DVT Prophylaxis-   Eliquis  AM Labs Ordered, also please review Full Orders  Family Communication: Admission, patients condition and plan of care including tests being ordered have been discussed with the patient and her daughter at bedside who indicate understanding and agree with the plan and Code Status.  Code Status: Full code  Admission status: Observation     Time spent in minutes : 60 minutes   Katti Pelle S Zuley Lutter M.D

## 2020-09-05 NOTE — Plan of Care (Signed)
  Problem: Health Behavior/Discharge Planning: Goal: Ability to manage health-related needs will improve Outcome: Progressing   Problem: Clinical Measurements: Goal: Will remain free from infection Outcome: Progressing Goal: Diagnostic test results will improve Outcome: Progressing   Problem: Activity: Goal: Risk for activity intolerance will decrease Outcome: Progressing   Problem: Nutrition: Goal: Adequate nutrition will be maintained Outcome: Progressing   Problem: Elimination: Goal: Will not experience complications related to bowel motility Outcome: Progressing

## 2020-09-06 DIAGNOSIS — L03115 Cellulitis of right lower limb: Secondary | ICD-10-CM | POA: Diagnosis present

## 2020-09-06 DIAGNOSIS — H3530 Unspecified macular degeneration: Secondary | ICD-10-CM | POA: Diagnosis present

## 2020-09-06 DIAGNOSIS — I517 Cardiomegaly: Secondary | ICD-10-CM | POA: Diagnosis not present

## 2020-09-06 DIAGNOSIS — N390 Urinary tract infection, site not specified: Secondary | ICD-10-CM | POA: Diagnosis present

## 2020-09-06 DIAGNOSIS — I4819 Other persistent atrial fibrillation: Secondary | ICD-10-CM | POA: Diagnosis present

## 2020-09-06 DIAGNOSIS — J90 Pleural effusion, not elsewhere classified: Secondary | ICD-10-CM | POA: Diagnosis not present

## 2020-09-06 DIAGNOSIS — N3000 Acute cystitis without hematuria: Secondary | ICD-10-CM

## 2020-09-06 DIAGNOSIS — K5900 Constipation, unspecified: Secondary | ICD-10-CM | POA: Diagnosis present

## 2020-09-06 DIAGNOSIS — I4891 Unspecified atrial fibrillation: Secondary | ICD-10-CM | POA: Diagnosis not present

## 2020-09-06 DIAGNOSIS — R338 Other retention of urine: Secondary | ICD-10-CM | POA: Diagnosis not present

## 2020-09-06 DIAGNOSIS — D631 Anemia in chronic kidney disease: Secondary | ICD-10-CM | POA: Diagnosis present

## 2020-09-06 DIAGNOSIS — I480 Paroxysmal atrial fibrillation: Secondary | ICD-10-CM | POA: Diagnosis present

## 2020-09-06 DIAGNOSIS — W19XXXD Unspecified fall, subsequent encounter: Secondary | ICD-10-CM | POA: Diagnosis present

## 2020-09-06 DIAGNOSIS — N184 Chronic kidney disease, stage 4 (severe): Secondary | ICD-10-CM | POA: Diagnosis present

## 2020-09-06 DIAGNOSIS — Z20822 Contact with and (suspected) exposure to covid-19: Secondary | ICD-10-CM | POA: Diagnosis present

## 2020-09-06 DIAGNOSIS — Z7952 Long term (current) use of systemic steroids: Secondary | ICD-10-CM | POA: Diagnosis not present

## 2020-09-06 DIAGNOSIS — N179 Acute kidney failure, unspecified: Secondary | ICD-10-CM | POA: Diagnosis not present

## 2020-09-06 DIAGNOSIS — Z7901 Long term (current) use of anticoagulants: Secondary | ICD-10-CM | POA: Diagnosis not present

## 2020-09-06 DIAGNOSIS — J9601 Acute respiratory failure with hypoxia: Secondary | ICD-10-CM | POA: Diagnosis present

## 2020-09-06 DIAGNOSIS — I5033 Acute on chronic diastolic (congestive) heart failure: Secondary | ICD-10-CM | POA: Diagnosis not present

## 2020-09-06 DIAGNOSIS — D509 Iron deficiency anemia, unspecified: Secondary | ICD-10-CM | POA: Diagnosis present

## 2020-09-06 DIAGNOSIS — I272 Pulmonary hypertension, unspecified: Secondary | ICD-10-CM | POA: Diagnosis present

## 2020-09-06 DIAGNOSIS — T446X5A Adverse effect of alpha-adrenoreceptor antagonists, initial encounter: Secondary | ICD-10-CM | POA: Diagnosis present

## 2020-09-06 DIAGNOSIS — M353 Polymyalgia rheumatica: Secondary | ICD-10-CM | POA: Diagnosis present

## 2020-09-06 DIAGNOSIS — M810 Age-related osteoporosis without current pathological fracture: Secondary | ICD-10-CM | POA: Diagnosis present

## 2020-09-06 DIAGNOSIS — E860 Dehydration: Secondary | ICD-10-CM | POA: Diagnosis present

## 2020-09-06 DIAGNOSIS — Y846 Urinary catheterization as the cause of abnormal reaction of the patient, or of later complication, without mention of misadventure at the time of the procedure: Secondary | ICD-10-CM | POA: Diagnosis present

## 2020-09-06 DIAGNOSIS — N281 Cyst of kidney, acquired: Secondary | ICD-10-CM | POA: Diagnosis not present

## 2020-09-06 DIAGNOSIS — S81011A Laceration without foreign body, right knee, initial encounter: Secondary | ICD-10-CM | POA: Diagnosis present

## 2020-09-06 DIAGNOSIS — T83518A Infection and inflammatory reaction due to other urinary catheter, initial encounter: Secondary | ICD-10-CM | POA: Diagnosis present

## 2020-09-06 DIAGNOSIS — I952 Hypotension due to drugs: Secondary | ICD-10-CM | POA: Diagnosis present

## 2020-09-06 DIAGNOSIS — R0602 Shortness of breath: Secondary | ICD-10-CM | POA: Diagnosis not present

## 2020-09-06 DIAGNOSIS — H43821 Vitreomacular adhesion, right eye: Secondary | ICD-10-CM | POA: Diagnosis present

## 2020-09-06 DIAGNOSIS — I130 Hypertensive heart and chronic kidney disease with heart failure and stage 1 through stage 4 chronic kidney disease, or unspecified chronic kidney disease: Secondary | ICD-10-CM | POA: Diagnosis present

## 2020-09-06 LAB — URINALYSIS, ROUTINE W REFLEX MICROSCOPIC
Bilirubin Urine: NEGATIVE
Glucose, UA: NEGATIVE mg/dL
Ketones, ur: NEGATIVE mg/dL
Nitrite: POSITIVE — AB
Protein, ur: 30 mg/dL — AB
Specific Gravity, Urine: 1.01 (ref 1.005–1.030)
WBC, UA: >50 WBC/hpf — ABNORMAL HIGH (ref 0–5)
pH: 7.5 (ref 5.0–8.0)

## 2020-09-06 LAB — COMPREHENSIVE METABOLIC PANEL
ALT: 18 U/L (ref 0–44)
AST: 20 U/L (ref 15–41)
Albumin: 2.7 g/dL — ABNORMAL LOW (ref 3.5–5.0)
Alkaline Phosphatase: 79 U/L (ref 38–126)
Anion gap: 8 (ref 5–15)
BUN: 28 mg/dL — ABNORMAL HIGH (ref 8–23)
CO2: 24 mmol/L (ref 22–32)
Calcium: 7.8 mg/dL — ABNORMAL LOW (ref 8.9–10.3)
Chloride: 100 mmol/L (ref 98–111)
Creatinine, Ser: 1.63 mg/dL — ABNORMAL HIGH (ref 0.44–1.00)
GFR, Estimated: 29 mL/min — ABNORMAL LOW (ref >60)
Glucose, Bld: 92 mg/dL (ref 70–99)
Potassium: 4.3 mmol/L (ref 3.5–5.1)
Sodium: 132 mmol/L — ABNORMAL LOW (ref 135–145)
Total Bilirubin: 0.7 mg/dL (ref 0.3–1.2)
Total Protein: 5.4 g/dL — ABNORMAL LOW (ref 6.5–8.1)

## 2020-09-06 LAB — CBC
HCT: 24.4 % — ABNORMAL LOW (ref 36.0–46.0)
Hemoglobin: 7.7 g/dL — ABNORMAL LOW (ref 12.0–15.0)
MCH: 28.2 pg (ref 26.0–34.0)
MCHC: 31.6 g/dL (ref 30.0–36.0)
MCV: 89.4 fL (ref 80.0–100.0)
Platelets: 287 10*3/uL (ref 150–400)
RBC: 2.73 MIL/uL — ABNORMAL LOW (ref 3.87–5.11)
RDW: 16.1 % — ABNORMAL HIGH (ref 11.5–15.5)
WBC: 7.3 10*3/uL (ref 4.0–10.5)
nRBC: 0.3 % — ABNORMAL HIGH (ref 0.0–0.2)

## 2020-09-06 LAB — TSH: TSH: 3.3 u[IU]/mL (ref 0.350–4.500)

## 2020-09-06 MED ORDER — ENSURE ENLIVE PO LIQD: 237.0000 mL | Freq: Two times a day (BID) | ORAL | Status: DC

## 2020-09-06 MED ORDER — DM-GUAIFENESIN ER 30-600 MG PO TB12: 1.0000 | ORAL_TABLET | Freq: Two times a day (BID) | ORAL | Status: DC | PRN

## 2020-09-06 MED ORDER — CHLORHEXIDINE GLUCONATE CLOTH 2 % EX PADS: 6.0000 | MEDICATED_PAD | Freq: Every day | CUTANEOUS | Status: DC

## 2020-09-06 MED ORDER — METOPROLOL TARTRATE 5 MG/5ML IV SOLN: 5.0000 mg | INTRAVENOUS | Status: DC | PRN

## 2020-09-06 MED ORDER — FERROUS SULFATE 325 (65 FE) MG PO TABS
325.0000 mg | ORAL_TABLET | Freq: Every day | ORAL | Status: DC
  Administered 2020-09-06 – 2020-09-09 (×4): 325 mg via ORAL
  Filled 2020-09-06 (×4): qty 1

## 2020-09-06 MED ORDER — TAMSULOSIN HCL 0.4 MG PO CAPS
0.4000 mg | ORAL_CAPSULE | Freq: Every day | ORAL | Status: DC
  Administered 2020-09-06: 0.4 mg via ORAL
  Filled 2020-09-06: qty 1

## 2020-09-06 MED ORDER — BOOST / RESOURCE BREEZE PO LIQD CUSTOM
1.0000 | ORAL | Status: DC
  Administered 2020-09-07: 1 via ORAL

## 2020-09-06 MED ORDER — SODIUM CHLORIDE 0.9 % IV SOLN: INTRAVENOUS | Status: AC

## 2020-09-06 MED ORDER — SULFAMETHOXAZOLE-TRIMETHOPRIM 400-80 MG PO TABS
1.0000 | ORAL_TABLET | Freq: Two times a day (BID) | ORAL | Status: DC
  Administered 2020-09-06 – 2020-09-07 (×2): 1 via ORAL
  Filled 2020-09-06 (×2): qty 1

## 2020-09-06 MED ORDER — IPRATROPIUM-ALBUTEROL 0.5-2.5 (3) MG/3ML IN SOLN: 3.0000 mL | RESPIRATORY_TRACT | Status: DC | PRN

## 2020-09-06 NOTE — Progress Notes (Signed)
Chaplain engaged in an initial visit with Nadina and her granddaughter. Kaitrin shared about her healthcare journey in the hospital and the restlessness that she has been experiencing.  Chaplain offered listening, presence, and prayer.    09/06/20 1400  Clinical Encounter Type  Visited With Patient and family together  Visit Type Initial;Spiritual support  Spiritual Encounters  Spiritual Needs Prayer

## 2020-09-06 NOTE — Progress Notes (Signed)
PHARMACY NOTE:  ANTIMICROBIAL RENAL DOSAGE ADJUSTMENT  Current antimicrobial regimen includes a mismatch between antimicrobial dosage and estimated renal function.  As per policy approved by the Pharmacy & Therapeutics and Medical Executive Committees, the antimicrobial dosage will be adjusted accordingly.  Current antimicrobial dosage:  Bactrim DS 1 tablet PO BID  Indication: cellulitis, UTI  Renal Function:  Estimated Creatinine Clearance: 19 mL/min (A) (by C-G formula based on SCr of 1.63 mg/dL (H)). []      On intermittent HD, scheduled: []      On CRRT    Antimicrobial dosage has been changed to:  Bactrim SS (single strength) 1 tablet PO BID  Additional comments: Monitor K+, renal function very closely.    Thank you for allowing pharmacy to be a part of this patient's care.  Luiz Ochoa, [facility] 09/06/2020 2:58 PM

## 2020-09-06 NOTE — Progress Notes (Signed)
PROGRESS NOTE    Allison Norman  XVQ:[id number] DOB: 02/16/1924 DOA: 09/05/2020 PCP: [name], MD   Brief Narrative:  87 year old with history of paroxysmal A. fib on Eliquis, HTN, polymyalgia rheumatica on chronic prednisone discharged on 08/31/2020 after being here for fall with laceration of the right knee.  At that time developed acute urinary retention, Flomax was started and Foley catheter was placed.  Now coming back to the hospital with generalized fatigue and weakness.  In the ED she was found to be in A. fib with RVR with elevated BNP.   Assessment & Plan:   Active Problems:   Atrial fibrillation (HCC)   Atrial fibrillation with RVR, persistent -Heart rate still remains uncontrolled with heart rate in 120s. -Likely driven by underlying infection -Amiodarone, Coreg, Eliquis.  IV Lopressor as needed, uptitrate Coreg as necessary.  Gentle hydration.  Generalized weakness and fatigue Urinary tract infection, CAUTI - Already on Bactrim twice daily for total 5 days.  Follow culture data.  Stop Flomax due to issues with dehydration and hypotension.  Will discontinue Foley catheter, voiding trial -PT/OT  Laceration in the right knee during last admission Right lower extremity cellulitis - Superficial wound care.  Allergic to penicillin.  Currently on Bactrim twice daily for 5 days  Anemia of chronic disease - Iron studies shows low iron, low saturation 9 ferritin 33.  Iron supplements and bowel regimen.  Hemoglobin stable around 7.7  Chronic diastolic CHF with preserved EF - Does not appear to be volume overloaded.  Hold Lasix currently on hold.  Chest x-ray is negative for pulmonary edema.  No respiratory symptoms at this time.  CKD stage IIIb -Creatinine currently at baseline of 1.6.  Polymyalgia rheumatica -Chronic prednisone  Essential hypertension -Continue clonidine and Coreg.  Holding Norvasc and lasix  DVT prophylaxis: Eliquis Code Status: Full  code Family Communication: Daughter was at bedside  Patient remains in the hospital today for better rate control, still in A. fib with RVR during my visit with heart rate as high as 125.  Dispo: The patient is from: Home              Anticipated d/c is to: Home              Patient currently is not medically stable to d/c.   Difficult to place patient No      Subjective: Sitting up in the chair, overall feels tired but slightly better compared to yesterday.  Tells me Flomax makes her feel dehydrated and loopy.  Review of Systems Otherwise negative except as per HPI, including: General: Denies fever, chills, night sweats or unintended weight loss. Resp: Denies cough, wheezing, shortness of breath. Cardiac: Denies chest pain, palpitations, orthopnea, paroxysmal nocturnal dyspnea. GI: Denies abdominal pain, nausea, vomiting, diarrhea or constipation GU: Denies dysuria, frequency, hesitancy or incontinence MS: Denies muscle aches, joint pain or swelling Neuro: Denies headache, neurologic deficits (focal weakness, numbness, tingling), abnormal gait Psych: Denies anxiety, depression, SI/HI/AVH Skin: Denies new rashes or lesions ID: Denies sick contacts, exotic exposures, travel  Examination:  General exam: Appears calm and comfortable  Respiratory system: Clear to auscultation. Respiratory effort normal. Cardiovascular system: S1 & S2 heard, irregularly irregular heart rate in 120s no JVD, murmurs, rubs, gallops or clicks. No pedal edema. Gastrointestinal system: Abdomen is nondistended, soft and nontender. No organomegaly or masses felt. Normal bowel sounds heard. Central nervous system: Alert and oriented. No focal neurological deficits. Extremities: Symmetric 5 x 5 power. Skin: No  rashes, lesions or ulcers Psychiatry: Judgement and insight appear normal. Mood & affect appropriate.     Objective: Vitals:   09/05/20 1900 09/05/20 2118 09/05/20 2313 09/06/20 0121  BP: 126/64  (!) 143/91 110/76 127/70  Pulse: (!) 127 (!) 126 (!) 122 74  Resp: (!) 24 18 18 17   Temp:  98.1 F (36.7 C) 98.6 F (37 C) 99.1 F (37.3 C)  TempSrc:  Oral Oral Oral  SpO2: 94% 95% 95% 94%  Weight:      Height:        Intake/Output Summary (Last 24 hours) at 09/06/2020 0725 Last data filed at 09/06/2020 0200 Gross per 24 hour  Intake --  Output 500 ml  Net -500 ml   Filed Weights   09/05/20 1429  Weight: 63.5 kg     Data Reviewed:   CBC: Recent Labs  Lab 08/31/20 1014 09/02/20 1835 09/05/20 1530 09/06/20 0403  WBC 8.3 7.9 8.3 7.3  NEUTROABS  --   --  5.8  --   HGB 8.9* 8.1* 8.5* 7.7*  HCT 28.7* 26.4* 28.3* 24.4*  MCV 92.3 93.3 94.0 89.4  PLT 229 224 307 948   Basic Metabolic Panel: Recent Labs  Lab 08/31/20 0310 09/02/20 1835 09/05/20 1530 09/06/20 0403  NA 131* 131* 131* 132*  K 4.8 5.1 4.3 4.3  CL 101 96* 96* 100  CO2 23 28 25 24   GLUCOSE 94 103* 119* 92  BUN 23 33* 34* 28*  CREATININE 1.93* 1.96* 1.92* 1.63*  CALCIUM 7.8* 7.9* 8.1* 7.8*  MG  --   --  2.7*  --    GFR: Estimated Creatinine Clearance: 19 mL/min (A) (by C-G formula based on SCr of 1.63 mg/dL (H)). Liver Function Tests: Recent Labs  Lab 08/31/20 0310 09/05/20 1530 09/06/20 0403  AST 31 25 20   ALT 17 20 18   ALKPHOS 75 93 79  BILITOT 0.7 1.0 0.7  PROT 4.8* 6.2* 5.4*  ALBUMIN 2.6* 3.2* 2.7*   No results for input(s): LIPASE, AMYLASE in the last 168 hours. No results for input(s): AMMONIA in the last 168 hours. Coagulation Profile: No results for input(s): INR, PROTIME in the last 168 hours. Cardiac Enzymes: Recent Labs  Lab 09/05/20 1530  CKTOTAL 87   BNP (last 3 results) No results for input(s): PROBNP in the last 8760 hours. HbA1C: No results for input(s): HGBA1C in the last 72 hours. CBG: No results for input(s): GLUCAP in the last 168 hours. Lipid Profile: No results for input(s): CHOL, HDL, LDLCALC, TRIG, CHOLHDL, LDLDIRECT in the last 72 hours. Thyroid Function  Tests: No results for input(s): TSH, T4TOTAL, FREET4, T3FREE, THYROIDAB in the last 72 hours. Anemia Panel: Recent Labs    09/05/20 1828  VITAMINB12 571  FOLATE 16.4  FERRITIN 33  TIBC 416  IRON 15*  RETICCTPCT 4.1*   Sepsis Labs: Recent Labs  Lab 09/05/20 1530 09/05/20 1700  LATICACIDVEN 1.1 0.9    Recent Results (from the past 240 hour(s))  Resp Panel by RT-PCR (Flu A&B, Covid) Nasopharyngeal Swab     Status: None   Collection Time: 08/27/20 11:40 PM   Specimen: Nasopharyngeal Swab; Nasopharyngeal(NP) swabs in vial transport medium  Result Value Ref Range Status   SARS Coronavirus 2 by RT PCR NEGATIVE NEGATIVE Final    Comment: (NOTE) SARS-CoV-2 target nucleic acids are NOT DETECTED.  The SARS-CoV-2 RNA is generally detectable in upper respiratory specimens during the acute phase of infection. The lowest concentration of SARS-CoV-2 viral copies this  assay can detect is 138 copies/mL. A negative result does not preclude SARS-Cov-2 infection and should not be used as the sole basis for treatment or other patient management decisions. A negative result may occur with  improper specimen collection/handling, submission of specimen other than nasopharyngeal swab, presence of viral mutation(s) within the areas targeted by this assay, and inadequate number of viral copies(<138 copies/mL). A negative result must be combined with clinical observations, patient history, and epidemiological information. The expected result is Negative.  Fact Sheet for Patients:  EntrepreneurPulse.com.au  Fact Sheet for Healthcare Providers:  IncredibleEmployment.be  This test is no t yet approved or cleared by the [place] FDA and  has been authorized for detection and/or diagnosis of SARS-CoV-2 by FDA under an Emergency Use Authorization (EUA). This EUA will remain  in effect (meaning this test can be used) for the duration of the COVID-19  declaration under Section 564(b)(1) of the Act, 21 U.S.C.section 360bbb-3(b)(1), unless the authorization is terminated  or revoked sooner.       Influenza A by PCR NEGATIVE NEGATIVE Final   Influenza B by PCR NEGATIVE NEGATIVE Final    Comment: (NOTE) The Xpert Xpress SARS-CoV-2/FLU/RSV plus assay is intended as an aid in the diagnosis of influenza from Nasopharyngeal swab specimens and should not be used as a sole basis for treatment. Nasal washings and aspirates are unacceptable for Xpert Xpress SARS-CoV-2/FLU/RSV testing.  Fact Sheet for Patients: EntrepreneurPulse.com.au  Fact Sheet for Healthcare Providers: IncredibleEmployment.be  This test is not yet approved or cleared by the [place] FDA and has been authorized for detection and/or diagnosis of SARS-CoV-2 by FDA under an Emergency Use Authorization (EUA). This EUA will remain in effect (meaning this test can be used) for the duration of the COVID-19 declaration under Section 564(b)(1) of the Act, 21 U.S.C. section 360bbb-3(b)(1), unless the authorization is terminated or revoked.  Performed at [facility], [place] [address] [place], [place] [address]   Urine Culture     Status: None   Collection Time: 08/31/20  2:22 PM   Specimen: Urine, Clean Catch  Result Value Ref Range Status   Specimen Description URINE, CLEAN CATCH  Final   Special Requests NONE  Final   Culture   Final    NO GROWTH Performed at [facility], [place] [address], [place], [place] [address]    Report Status 09/02/2020 FINAL  Final  Resp Panel by RT-PCR (Flu A&B, Covid) Nasopharyngeal Swab     Status: None   Collection Time: 09/05/20  3:30 PM   Specimen: Nasopharyngeal Swab; Nasopharyngeal(NP) swabs in vial transport medium  Result Value Ref Range Status   SARS Coronavirus 2 by RT PCR NEGATIVE NEGATIVE Final    Comment: (NOTE) SARS-CoV-2 target nucleic acids are NOT DETECTED.  The  SARS-CoV-2 RNA is generally detectable in upper respiratory specimens during the acute phase of infection. The lowest concentration of SARS-CoV-2 viral copies this assay can detect is 138 copies/mL. A negative result does not preclude SARS-Cov-2 infection and should not be used as the sole basis for treatment or other patient management decisions. A negative result may occur with  improper specimen collection/handling, submission of specimen other than nasopharyngeal swab, presence of viral mutation(s) within the areas targeted by this assay, and inadequate number of viral copies(<138 copies/mL). A negative result must be combined with clinical observations, patient history, and epidemiological information. The expected result is Negative.  Fact Sheet for Patients:  EntrepreneurPulse.com.au  Fact Sheet for  Healthcare Providers:  IncredibleEmployment.be  This test is no t yet approved or cleared by the [place] and  has been authorized for detection and/or diagnosis of SARS-CoV-2 by FDA under an Emergency Use Authorization (EUA). This EUA will remain  in effect (meaning this test can be used) for the duration of the COVID-19 declaration under Section 564(b)(1) of the Act, 21 U.S.C.section 360bbb-3(b)(1), unless the authorization is terminated  or revoked sooner.       Influenza A by PCR NEGATIVE NEGATIVE Final   Influenza B by PCR NEGATIVE NEGATIVE Final    Comment: (NOTE) The Xpert Xpress SARS-CoV-2/FLU/RSV plus assay is intended as an aid in the diagnosis of influenza from Nasopharyngeal swab specimens and should not be used as a sole basis for treatment. Nasal washings and aspirates are unacceptable for Xpert Xpress SARS-CoV-2/FLU/RSV testing.  Fact Sheet for Patients: EntrepreneurPulse.com.au  Fact Sheet for Healthcare Providers: IncredibleEmployment.be  This test is not yet approved or  cleared by the [place] FDA and has been authorized for detection and/or diagnosis of SARS-CoV-2 by FDA under an Emergency Use Authorization (EUA). This EUA will remain in effect (meaning this test can be used) for the duration of the COVID-19 declaration under Section 564(b)(1) of the Act, 21 U.S.C. section 360bbb-3(b)(1), unless the authorization is terminated or revoked.  Performed at [facility], [place] [address], [place], [place] [address]          Radiology Studies: [facility] Chest Port 1 View  Result Date: 09/05/2020 CLINICAL DATA:  Generalized weakness, fatigue EXAM: PORTABLE CHEST 1 VIEW COMPARISON:  07/22/2019 FINDINGS: Rotated examination. Cardiomegaly with left chest multi lead pacer. Small, layering bilateral pleural effusions. IMPRESSION: 1. Small, layering bilateral pleural effusions. 2. Cardiomegaly. Electronically Signed   By: Eddie Candle M.D.   On: 09/05/2020 15:19        Scheduled Meds:  amiodarone  100 mg Oral Daily   apixaban  2.5 mg Oral BID   carvedilol  3.125 mg Oral BID WC   Chlorhexidine Gluconate Cloth  6 each Topical Daily   cloNIDine  0.2 mg Oral BID   pantoprazole  40 mg Oral BID   predniSONE  5 mg Oral Q breakfast   senna  1 tablet Oral BID   sulfamethoxazole-trimethoprim  1 tablet Oral Q12H   Continuous Infusions:  sodium chloride Stopped (09/05/20 2209)     LOS: 0 days   Time spent= 35 mins    Mozes Sagar Arsenio Loader, MD [facility]  If 7PM-7AM, please contact night-coverage  09/06/2020, 7:25 AM

## 2020-09-06 NOTE — Progress Notes (Signed)
Initial Nutrition Assessment  DOCUMENTATION CODES:   Not applicable  INTERVENTION:  - will order Boost Breeze once/day, each supplement provides 250 kcal and 9 grams of protein. - will order Ensure Plus BID, each supplement provides 350 kcal and 13 grams of protein. - complete NFPE when feasible.    NUTRITION DIAGNOSIS:   Increased nutrient needs related to acute illness as evidenced by estimated needs.  GOAL:   Patient will meet greater than or equal to 90% of their needs  MONITOR:   PO intake, Supplement acceptance, Labs, Weight trends  REASON FOR ASSESSMENT:   Malnutrition Screening Tool  ASSESSMENT:   87 year old with medical history of afib, HTN, polymyalgia rheumatica on chronic prednisone, osteoporosis, macular degeneration, CKD, breast cancer, and skin cancer. She was recently admitted and discharged on 8/26. She presented to the ED due to generalized fatigue and weakness.  Unable to see patient at time of attempted visit. No intakes documented since admission.  She has not been seen by a [place] RD at any time in the past.   Weight yesterday was documented as 140 lb, which appears to be a stated weight. Weight had been fairly stable (139-145 lb) from 01/11/20-09/02/20.  Per notes: - afib with persistent RVR - UTI/CAUTI - RLE cellulitis    Labs reviewed; Na: 132 mmol/l, BUN: 28 mg/dl, creatinine: 1.63 mg/dl, Ca: 7.8 mg/dl, GFR: 29 ml/min.  Medications reviewed; 325 mg ferrous sulfate/day, 40 mg oral protonix BID, 5 mg deltasone/day, 1 tablet senokot BID. IVF; NS @ 50 ml/hr.     NUTRITION - FOCUSED PHYSICAL EXAM:  Unable to complete at this time.   Diet Order:   Diet Order             Diet Heart Room service appropriate? Yes; Fluid consistency: Thin  Diet effective now                   EDUCATION NEEDS:   No education needs have been identified at this time  Skin:  Skin Assessment: Reviewed RN Assessment  Last BM:  PTA/unknown  Height:    Ht Readings from Last 1 Encounters:  09/05/20 5\' 5"  (1.651 m)    Weight:   Wt Readings from Last 1 Encounters:  09/05/20 63.5 kg      Estimated Nutritional Needs:  Kcal:  1500-1700 kcal Protein:  70-80 grams Fluid:  >/= 1.6 L/day      Jarome Matin, MS, RD, LDN, CNSC Inpatient Clinical Dietitian RD pager # available in [facility]  After hours/weekend pager # available in [facility]

## 2020-09-06 NOTE — Evaluation (Signed)
Occupational Therapy Evaluation Patient Details Name: Allison Norman MRN: [id number] DOB: 11/11/1924 Today's Date: 09/06/2020    History of Present Illness 87 year old with history of paroxysmal A. fib on Eliquis, HTN, polymyalgia rheumatica on chronic prednisone discharged on 08/31/2020 after being here for fall with laceration of the right knee.  Now coming back to the hospital with generalized fatigue and weakness.  In the ED she was found to be in A. fib with RVR with elevated BNP.   Clinical Impression   Allison Norman is a 87 year old woman with impaired balance, generalized weakness, decreased activity tolerance with some RLE pain resulting in a decline in functional abilities. Patient's o2 sat dropped to 83% on room air with short ambulation (16 feet) with patient being asymptomatic and needed one liter to recover in to 90s. Patient's RH up to 122 with activity but settled back in to the 90s. Patient exhibits a posterior lean with standing and needing min assist to ambulate with walker and mod assist for LB ADLs. Patient has 24/7 caregiver support at home. Recommend [place] services at discharge. Patient will benefit from skilled OT services while in hospital to improve deficits and learn compensatory strategies as needed in order to return to PLOF.      Follow Up Recommendations  Home health OT;Supervision/Assistance - 24 hour    Equipment Recommendations  3 in 1 bedside commode    Recommendations for Other Services       Precautions / Restrictions Precautions Precautions: Fall Restrictions Weight Bearing Restrictions: No      Mobility Bed Mobility               General bed mobility comments: up in chair    Transfers Overall transfer level: Needs assistance Equipment used: Rolling walker (2 wheeled) Transfers: Sit to/from Stand Sit to Stand: Min guard         General transfer comment: Min guard to stand (took 3 attempts) due to posterior lean. min assist to  ambulate with RW. O2 sat droppedto 83% with approx 16 feet of ambulation in room.    Balance Overall balance assessment: Needs assistance Sitting-balance support: No upper extremity supported;Feet supported Sitting balance-Leahy Scale: Fair   Postural control: Posterior lean Standing balance support: Bilateral upper extremity supported;During functional activity Standing balance-Leahy Scale: Poor                             ADL either performed or assessed with clinical judgement   ADL Overall ADL's : Needs assistance/impaired Eating/Feeding: Independent;Sitting   Grooming: Set up;Sitting   Upper Body Bathing: Set up;Sitting   Lower Body Bathing: Moderate assistance;Sit to/from stand   Upper Body Dressing : Set up;Sitting   Lower Body Dressing: Maximal assistance;Sit to/from stand   Toilet Transfer: Minimal assistance;Ambulation;RW   Toileting- Clothing Manipulation and Hygiene: Maximal assistance;Sit to/from stand Toileting - Clothing Manipulation Details (indicate cue type and reason): decreased ability to manage clothing due to impaired balance     Functional mobility during ADLs: Minimal assistance;Rolling walker       Vision Patient Visual Report: No change from baseline Vision Assessment?: No apparent visual deficits     Perception     Praxis      Pertinent Vitals/Pain Pain Assessment: No/denies pain     Hand Dominance Right   Extremity/Trunk Assessment Upper Extremity Assessment Upper Extremity Assessment: Generalized weakness   Lower Extremity Assessment Lower Extremity Assessment: Defer to PT evaluation  Cervical / Trunk Assessment Cervical / Trunk Assessment: Kyphotic   Communication Communication Communication: No difficulties   Cognition Arousal/Alertness: Awake/alert Behavior During Therapy: WFL for tasks assessed/performed Overall Cognitive Status: Within Functional Limits for tasks assessed                                      General Comments       Exercises     Shoulder Instructions      Home Living Family/patient expects to be discharged to:: Private residence Living Arrangements: Alone Available Help at Discharge: Family;Available PRN/intermittently Type of Home: House Home Access: Stairs to enter [employer] of Steps: 2   Home Layout: One level     Bathroom Shower/Tub: Occupational psychologist: Standard Bathroom Accessibility: Yes How Accessible: Accessible via walker Home Equipment: Grab bars - tub/shower;Walker - 4 wheels;Cane - single Tax inspector - 2 wheels          Prior Functioning/Environment Level of Independence: Needs assistance  Gait / Transfers Assistance Needed: uses RW for household ambulation, cane for community ambulation ADL's / Homemaking Assistance Needed: daughter assists with grocery shopping; has cleaners. pt able to cook   Comments: Above history is from prior admission. Patient has 24/7 caregivers set up (since discharge) and needing increased assitance with ADLs and decreased mobility.        OT Problem List: Decreased strength;Decreased range of motion;Impaired balance (sitting and/or standing);Decreased safety awareness;Decreased knowledge of use of DME or AE;Decreased knowledge of precautions;Pain;Decreased activity tolerance;Cardiopulmonary status limiting activity      OT Treatment/Interventions: Self-care/ADL training;Therapeutic exercise;DME and/or AE instruction;Therapeutic activities;Balance training;Patient/family education    OT Goals(Current goals can be found in the care plan section) Acute Rehab OT Goals Patient Stated Goal: to improve independence OT Goal Formulation: With patient Time For Goal Achievement: 09/20/20 Potential to Achieve Goals: Good  OT Frequency: Min 2X/week   Barriers to D/C:            Co-evaluation              AM-PAC OT "6 Clicks" Daily Activity      Outcome Measure Help from another person eating meals?: A Little Help from another person taking care of personal grooming?: A Little Help from another person toileting, which includes using toliet, bedpan, or urinal?: A Little Help from another person bathing (including washing, rinsing, drying)?: A Lot Help from another person to put on and taking off regular upper body clothing?: A Lot Help from another person to put on and taking off regular lower body clothing?: A Lot 6 Click Score: 15   End of Session Equipment Utilized During Treatment: Surveyor, mining Communication: Mobility status (o2 sat)  Activity Tolerance: Patient tolerated treatment well Patient left: in chair;with call bell/phone within reach;with family/visitor present  OT Visit Diagnosis: Unsteadiness on feet (R26.81);Other abnormalities of gait and mobility (R26.89);Muscle weakness (generalized) (M62.81);History of falling (Z91.81);Pain                Time: 1550-1610 OT Time Calculation (min): 20 min Charges:  OT General Charges $OT Visit: 1 Visit OT Evaluation $OT Eval Low Complexity: 1 Low  Arsal Tappan, OTR/L [place]  Office [phone] Pager: [facility] 09/06/2020, 5:35 PM

## 2020-09-06 NOTE — Telephone Encounter (Signed)
Patient has been admitted for multiple issues

## 2020-09-07 ENCOUNTER — Inpatient Hospital Stay (HOSPITAL_COMMUNITY): Payer: Medicare Other

## 2020-09-07 DIAGNOSIS — D509 Iron deficiency anemia, unspecified: Secondary | ICD-10-CM | POA: Diagnosis present

## 2020-09-07 DIAGNOSIS — N184 Chronic kidney disease, stage 4 (severe): Secondary | ICD-10-CM

## 2020-09-07 DIAGNOSIS — R338 Other retention of urine: Secondary | ICD-10-CM

## 2020-09-07 DIAGNOSIS — N179 Acute kidney failure, unspecified: Secondary | ICD-10-CM

## 2020-09-07 HISTORY — DX: Other retention of urine: R33.8

## 2020-09-07 LAB — CBC
HCT: 25.4 % — ABNORMAL LOW (ref 36.0–46.0)
Hemoglobin: 7.9 g/dL — ABNORMAL LOW (ref 12.0–15.0)
MCH: 27.7 pg (ref 26.0–34.0)
MCHC: 31.1 g/dL (ref 30.0–36.0)
MCV: 89.1 fL (ref 80.0–100.0)
Platelets: 317 10*3/uL (ref 150–400)
RBC: 2.85 MIL/uL — ABNORMAL LOW (ref 3.87–5.11)
RDW: 16 % — ABNORMAL HIGH (ref 11.5–15.5)
WBC: 7.6 10*3/uL (ref 4.0–10.5)
nRBC: 0 % (ref 0.0–0.2)

## 2020-09-07 LAB — BASIC METABOLIC PANEL
Anion gap: 5 (ref 5–15)
BUN: 28 mg/dL — ABNORMAL HIGH (ref 8–23)
CO2: 24 mmol/L (ref 22–32)
Calcium: 7.7 mg/dL — ABNORMAL LOW (ref 8.9–10.3)
Chloride: 100 mmol/L (ref 98–111)
Creatinine, Ser: 1.77 mg/dL — ABNORMAL HIGH (ref 0.44–1.00)
GFR, Estimated: 26 mL/min — ABNORMAL LOW (ref >60)
Glucose, Bld: 95 mg/dL (ref 70–99)
Potassium: 4.8 mmol/L (ref 3.5–5.1)
Sodium: 129 mmol/L — ABNORMAL LOW (ref 135–145)

## 2020-09-07 LAB — MAGNESIUM: Magnesium: 2.4 mg/dL (ref 1.7–2.4)

## 2020-09-07 MED ORDER — BISACODYL 10 MG RE SUPP
10.0000 mg | Freq: Once | RECTAL | Status: AC
  Administered 2020-09-07: 10 mg via RECTAL
  Filled 2020-09-07: qty 1

## 2020-09-07 MED ORDER — FUROSEMIDE 10 MG/ML IJ SOLN
20.0000 mg | Freq: Two times a day (BID) | INTRAMUSCULAR | Status: DC
  Administered 2020-09-07 – 2020-09-08 (×2): 20 mg via INTRAVENOUS
  Filled 2020-09-07 (×2): qty 2

## 2020-09-07 MED ORDER — CEPHALEXIN 500 MG PO CAPS
500.0000 mg | ORAL_CAPSULE | Freq: Two times a day (BID) | ORAL | Status: DC
  Administered 2020-09-07 – 2020-09-09 (×5): 500 mg via ORAL
  Filled 2020-09-07 (×5): qty 1

## 2020-09-07 MED ORDER — CHLORHEXIDINE GLUCONATE CLOTH 2 % EX PADS
6.0000 | MEDICATED_PAD | Freq: Every day | CUTANEOUS | Status: DC
  Administered 2020-09-08 – 2020-09-09 (×2): 6 via TOPICAL

## 2020-09-07 NOTE — Evaluation (Signed)
Physical Therapy Evaluation Patient Details Name: Allison Norman MRN: [id number] DOB: [birth year] Today's Date: 09/07/2020   History of Present Illness  87 year old with history of paroxysmal A. fib on Eliquis, HTN, polymyalgia rheumatica on chronic prednisone discharged on 08/31/2020 after being here for fall with laceration of the right knee.  Now coming back to the hospital with generalized fatigue and weakness.  In the ED she was found to be in A. fib with RVR with elevated BNP.  Clinical Impression  Pt admitted with above diagnosis. Pt currently with functional limitations due to the deficits listed below (see PT Problem List). Pt will benefit from skilled PT to increase their independence and safety with mobility to allow discharge to the venue listed below.  Pt required encouragement to mobilize today stating she felt too weak.  Pt eventually agreeable to at least up to recliner today.  Pt with recent admission and discharged home.  Pt has had 24/7 caregivers set up since recent d/c.  Pt is typically ambulatory with rollator.     Follow Up Recommendations Home health PT;Supervision/Assistance - 24 hour    Equipment Recommendations  None recommended by PT    Recommendations for Other Services       Precautions / Restrictions Precautions Precautions: Fall      Mobility  Bed Mobility Overal bed mobility: Needs Assistance Bed Mobility: Supine to Sit     Supine to sit: Min assist     General bed mobility comments: assist for upper body    Transfers Overall transfer level: Needs assistance Equipment used: Rolling walker (2 wheeled) Transfers: Sit to/from [facility] Sit to Stand: Min guard Stand pivot transfers: Min guard       General transfer comment: min/guard for safety, pt unsteady but self assisting with RW, remained on O2 , HR 102 bpm upon sitting in recliner  Ambulation/Gait             General Gait Details: pt declined  Chief Strategy Officer    Modified Rankin (Stroke Patients Only)       Balance           Standing balance support: Bilateral upper extremity supported Standing balance-Leahy Scale: Poor Standing balance comment: reliant on UE support                             Pertinent Vitals/Pain Pain Assessment: No/denies pain Pain Intervention(s): Monitored during session;Repositioned    Home Living Family/patient expects to be discharged to:: Private residence Living Arrangements: Alone Available Help at Discharge: Family;Available PRN/intermittently Type of Home: House Home Access: Stairs to enter   Entrance Stairs-Number of Steps: 2 Home Layout: One level Home Equipment: Grab bars - tub/shower;Walker - 4 wheels;Cane - single Tax inspector - 2 wheels      Prior Function Level of Independence: Needs assistance   Gait / Transfers Assistance Needed: uses RW for household ambulation, cane for community ambulation  ADL's / Homemaking Assistance Needed: daughter assists with grocery shopping; has cleaners. pt able to cook  Comments: Above history is from prior admission. Patient has 24/7 caregivers set up (since discharge) and needing increased assistance with ADLs and decreased mobility.     Hand Dominance   Dominant Hand: Right    Extremity/Trunk Assessment        Lower Extremity Assessment Lower Extremity Assessment: Generalized weakness RLE Deficits / Details: kerlix  wrapped at right knee (ace wrap due to edema with admission one week ago)    Cervical / Trunk Assessment Cervical / Trunk Assessment: Kyphotic  Communication   Communication: No difficulties  Cognition Arousal/Alertness: Awake/alert Behavior During Therapy: WFL for tasks assessed/performed Overall Cognitive Status: Within Functional Limits for tasks assessed                                        General Comments      Exercises      Assessment/Plan    PT Assessment Patient needs continued PT services  PT Problem List Decreased strength;Decreased activity tolerance;Decreased balance;Decreased mobility;Pain;Decreased knowledge of use of DME       PT Treatment Interventions DME instruction;Gait training;Functional mobility training;Therapeutic activities;Balance training;Therapeutic exercise;Patient/family education    PT Goals (Current goals can be found in the Care Plan section)  Acute Rehab PT Goals PT Goal Formulation: With patient Time For Goal Achievement: 09/21/20 Potential to Achieve Goals: Good    Frequency Min 3X/week   Barriers to discharge        Co-evaluation               AM-PAC PT "6 Clicks" Mobility  Outcome Measure Help needed turning from your back to your side while in a flat bed without using bedrails?: A Little Help needed moving from lying on your back to sitting on the side of a flat bed without using bedrails?: A Little Help needed moving to and from a bed to a chair (including a wheelchair)?: A Little Help needed standing up from a chair using your arms (e.g., wheelchair or bedside chair)?: A Little Help needed to walk in hospital room?: A Little Help needed climbing 3-5 steps with a railing? : A Little 6 Click Score: 18    End of Session Equipment Utilized During Treatment: Gait belt Activity Tolerance: Patient tolerated treatment well Patient left: in chair;with call bell/phone within reach;with chair alarm set   PT Visit Diagnosis: Muscle weakness (generalized) (M62.81);Unsteadiness on feet (R26.81)    Time: 8657-8469 PT Time Calculation (min) (ACUTE ONLY): 11 min   Charges:   PT Evaluation $PT Eval Low Complexity: 1 Low     Kati PT, DPT Acute Rehabilitation Services Pager: [phone] Office: [phone]   York Ram E 09/07/2020, 3:10 PM

## 2020-09-07 NOTE — Progress Notes (Signed)
PROGRESS NOTE    Allison Norman  RCV:[id number] DOB: [birth year] DOA: 09/05/2020 PCP: [name], MD    Brief Narrative:  87 year old with history of paroxysmal A. fib on Eliquis, HTN, polymyalgia rheumatica on chronic prednisone discharged on 08/31/2020 after being here for fall with laceration of the right knee.  At that time developed acute urinary retention, Flomax was started and Foley catheter was placed.  Now coming back to the hospital with generalized fatigue and weakness.  In the ED she was found to be in A. fib with RVR with elevated BNP.   Assessment & Plan:   Active Problems:   Essential hypertension   Polymyalgia rheumatica (HCC)   Atrial fibrillation with RVR (HCC)   CRI (chronic renal insufficiency), stage 4 (severe) (HCC)   PAF (paroxysmal atrial fibrillation) (HCC)   CKD (chronic kidney disease), stage IV (HCC)   Atrial fibrillation (HCC)   Acute urinary retention   Iron deficiency anemia   Atrial fibrillation with RVR, persistent -She is chronically on amiodarone and Coreg -Coreg dose has been adjusted -Overall heart rates are better -She is anticoagulated with Eliquis  Acute urinary retention Possible urinary tract infection -Patient had difficulty with urinary retention during her last hospitalization and was discharged with a Foley catheter -This was discontinued on admission and she has been receiving in and out catheterizations -I suspect that she may need continued Foley catheter if she continues to retain -Urinary retention likely impacted by large stool burden noted on last CT -Renal ultrasound did not show any hydronephrosis -Urinalysis indicated possible infection -Urine culture in process -Currently on Keflex  Chronic kidney disease stage IV -Creatinine currently at baseline at 1.7  Acute on chronic diastolic congestive heart failure -Echo from 01/2020 showed ejection fraction of 65 to 70% with grade 3 diastolic dysfunction -She was  also noted to have significant pulmonary hypertension with RV systolic pressure of 65 mmHg -Currently, she does feel more short of breath and recent chest imaging did not show bilateral pleural effusions -BNP elevated 874 -We will give a trial of IV Lasix  Polymyalgia rheumatica -Continue on home dose of prednisone  Hypertension -Continue on clonidine and Coreg  Anemia of chronic disease, but also likely has a component of iron deficiency -No obvious bleeding -Continue to monitor hemoglobin  Constipation -Noted on prior CT that she had a large stool burden  -Reports that its been several days since she had a bowel movement -We will provide a suppository    DVT prophylaxis: apixaban (ELIQUIS) tablet 2.5 mg Start: 09/05/20 2200 apixaban (ELIQUIS) tablet 2.5 mg  Code Status: Full code Family Communication: Discussed with daughter at the bedside Disposition Plan: Status is: Inpatient  Remains inpatient appropriate because:Hemodynamically unstable, IV treatments appropriate due to intensity of illness or inability to take PO, and Inpatient level of care appropriate due to severity of illness  Dispo: The patient is from: Home              Anticipated d/c is to: Home              Patient currently is not medically stable to d/c.   Difficult to place patient No         Consultants:    Procedures:    Antimicrobials:  Bactrim 8/31 > 9/2 Keflex 9/2 >   Subjective: Patient reports that she does feel more short of breath today.  Objective: Vitals:   09/07/20 0600 09/07/20 1030 09/07/20 1034 09/07/20 1335  BP:  122/75  Pulse:    91  Resp: 18   18  Temp:    99.1 F (37.3 C)  TempSrc:    Oral  SpO2:  (!) 84% 91% 95%  Weight:      Height:        Intake/Output Summary (Last 24 hours) at 09/07/2020 1956 Last data filed at 09/07/2020 1400 Gross per 24 hour  Intake 783.49 ml  Output 1900 ml  Net -1116.51 ml   Filed Weights   09/05/20 1429  Weight: 63.5 kg     Examination:  General exam: Appears calm and comfortable  Respiratory system: Clear to auscultation. Respiratory effort normal. Cardiovascular system: S1 & S2 heard, irregular. No JVD, murmurs, rubs, gallops or clicks. No pedal edema. Gastrointestinal system: Abdomen is nondistended, soft and nontender. No organomegaly or masses felt. Normal bowel sounds heard. Central nervous system: Alert and oriented. No focal neurological deficits. Extremities: Symmetric 5 x 5 power.  Wound on right knee, without any purulent drainage.  Laceration has been repaired.  There are some mild surrounding erythema Skin: No rashes, lesions or ulcers Psychiatry: Judgement and insight appear normal. Mood & affect appropriate.     Data Reviewed: I have personally reviewed following labs and imaging studies  CBC: Recent Labs  Lab 09/02/20 1835 09/05/20 1530 09/06/20 0403 09/07/20 1005  WBC 7.9 8.3 7.3 7.6  NEUTROABS  --  5.8  --   --   HGB 8.1* 8.5* 7.7* 7.9*  HCT 26.4* 28.3* 24.4* 25.4*  MCV 93.3 94.0 89.4 89.1  PLT 224 307 287 607   Basic Metabolic Panel: Recent Labs  Lab 09/02/20 1835 09/05/20 1530 09/06/20 0403 09/07/20 0401  NA 131* 131* 132* 129*  K 5.1 4.3 4.3 4.8  CL 96* 96* 100 100  CO2 28 25 24 24   GLUCOSE 103* 119* 92 95  BUN 33* 34* 28* 28*  CREATININE 1.96* 1.92* 1.63* 1.77*  CALCIUM 7.9* 8.1* 7.8* 7.7*  MG  --  2.7*  --  2.4   GFR: Estimated Creatinine Clearance: 17.5 mL/min (A) (by C-G formula based on SCr of 1.77 mg/dL (H)). Liver Function Tests: Recent Labs  Lab 09/05/20 1530 09/06/20 0403  AST 25 20  ALT 20 18  ALKPHOS 93 79  BILITOT 1.0 0.7  PROT 6.2* 5.4*  ALBUMIN 3.2* 2.7*   No results for input(s): LIPASE, AMYLASE in the last 168 hours. No results for input(s): AMMONIA in the last 168 hours. Coagulation Profile: No results for input(s): INR, PROTIME in the last 168 hours. Cardiac Enzymes: Recent Labs  Lab 09/05/20 1530  CKTOTAL 87   BNP (last 3  results) No results for input(s): PROBNP in the last 8760 hours. HbA1C: No results for input(s): HGBA1C in the last 72 hours. CBG: No results for input(s): GLUCAP in the last 168 hours. Lipid Profile: No results for input(s): CHOL, HDL, LDLCALC, TRIG, CHOLHDL, LDLDIRECT in the last 72 hours. Thyroid Function Tests: Recent Labs    09/06/20 0908  TSH 3.300   Anemia Panel: Recent Labs    09/05/20 1828  VITAMINB12 571  FOLATE 16.4  FERRITIN 33  TIBC 416  IRON 15*  RETICCTPCT 4.1*   Sepsis Labs: Recent Labs  Lab 09/05/20 1530 09/05/20 1700  LATICACIDVEN 1.1 0.9    Recent Results (from the past 240 hour(s))  Urine Culture     Status: None   Collection Time: 08/31/20  2:22 PM   Specimen: Urine, Clean Catch  Result Value Ref Range Status  Specimen Description URINE, CLEAN CATCH  Final   Special Requests NONE  Final   Culture   Final    NO GROWTH Performed at [facility], [place] [address] [place], [place] [address]    Report Status 09/02/2020 FINAL  Final  Culture, blood (routine x 2)     Status: None (Preliminary result)   Collection Time: 09/05/20  3:30 PM   Specimen: BLOOD RIGHT FOREARM  Result Value Ref Range Status   Specimen Description   Final    BLOOD RIGHT FOREARM Performed at [facility] [address], [place], [place] [address]    Special Requests   Final    BOTTLES DRAWN AEROBIC AND ANAEROBIC Blood Culture adequate volume Performed at [facility] [address], [place], [place] [address]    Culture   Final    NO GROWTH 1 DAY Performed at [facility], [place] [address], [place], [place] [address]    Report Status PENDING  Incomplete  Resp Panel by RT-PCR (Flu A&B, Covid) Nasopharyngeal Swab     Status: None   Collection Time: 09/05/20  3:30 PM   Specimen: Nasopharyngeal Swab; Nasopharyngeal(NP) swabs in vial transport medium  Result Value Ref Range Status   SARS Coronavirus 2 by RT PCR NEGATIVE  NEGATIVE Final    Comment: (NOTE) SARS-CoV-2 target nucleic acids are NOT DETECTED.  The SARS-CoV-2 RNA is generally detectable in upper respiratory specimens during the acute phase of infection. The lowest concentration of SARS-CoV-2 viral copies this assay can detect is 138 copies/mL. A negative result does not preclude SARS-Cov-2 infection and should not be used as the sole basis for treatment or other patient management decisions. A negative result may occur with  improper specimen collection/handling, submission of specimen other than nasopharyngeal swab, presence of viral mutation(s) within the areas targeted by this assay, and inadequate number of viral copies(<138 copies/mL). A negative result must be combined with clinical observations, patient history, and epidemiological information. The expected result is Negative.  Fact Sheet for Patients:  EntrepreneurPulse.com.au  Fact Sheet for Healthcare Providers:  IncredibleEmployment.be  This test is no t yet approved or cleared by the [place] FDA and  has been authorized for detection and/or diagnosis of SARS-CoV-2 by FDA under an Emergency Use Authorization (EUA). This EUA will remain  in effect (meaning this test can be used) for the duration of the COVID-19 declaration under Section 564(b)(1) of the Act, 21 U.S.C.section 360bbb-3(b)(1), unless the authorization is terminated  or revoked sooner.       Influenza A by PCR NEGATIVE NEGATIVE Final   Influenza B by PCR NEGATIVE NEGATIVE Final    Comment: (NOTE) The Xpert Xpress SARS-CoV-2/FLU/RSV plus assay is intended as an aid in the diagnosis of influenza from Nasopharyngeal swab specimens and should not be used as a sole basis for treatment. Nasal washings and aspirates are unacceptable for Xpert Xpress SARS-CoV-2/FLU/RSV testing.  Fact Sheet for Patients: EntrepreneurPulse.com.au  Fact Sheet for Healthcare  Providers: IncredibleEmployment.be  This test is not yet approved or cleared by the [place] FDA and has been authorized for detection and/or diagnosis of SARS-CoV-2 by FDA under an Emergency Use Authorization (EUA). This EUA will remain in effect (meaning this test can be used) for the duration of the COVID-19 declaration under Section 564(b)(1) of the Act, 21 U.S.C. section 360bbb-3(b)(1), unless the authorization is terminated or revoked.  Performed at [facility], [place] [address] [place], [place]  [address]   Culture, blood (routine x 2)     Status: None (Preliminary result)   Collection Time: 09/05/20  5:17 PM   Specimen: BLOOD RIGHT HAND  Result Value Ref Range Status   Specimen Description   Final    BLOOD RIGHT HAND Performed at [facility] [address], [place], [place] [address]    Special Requests   Final    BOTTLES DRAWN AEROBIC AND ANAEROBIC Blood Culture adequate volume Performed at [facility] [address], [place], [place] [address]    Culture   Final    NO GROWTH 1 DAY Performed at [facility], [place] [address] [place], [place] [address]    Report Status PENDING  Incomplete         Radiology Studies: US RENAL  Result Date: 09/07/2020 CLINICAL DATA:  Acute kidney injury EXAM: RENAL / URINARY TRACT ULTRASOUND COMPLETE COMPARISON:  CT abdomen pelvis 08/30/2020 FINDINGS: Right Kidney: Renal measurements: 8.9 x 3.8 x 4.0 cm = volume: 70.9 mL. No hydronephrosis. There is an anechoic, simple appearing cyst which measures 3.0 x 2.5 x 2.9 cm in the mid kidney. There is an additional hypoechoic cystic lesion measuring 2.0 x 1.7 x 1.3 cm. Left Kidney: Renal measurements: 9.5 x 4.8 x 4.9 cm = volume: 117.3 mL. No hydronephrosis. There are 2 cystic lesions which measure up to 1.1 and 1.0 cm. The cyst in the mid kidney appears anechoic and simple. The cyst in the lower pole appears  hypoechoic. Bladder: Decompressed with Foley catheter in place. Other: None. IMPRESSION: No hydronephrosis. Multiple bilateral renal cysts which are likely simple. Electronically Signed   By: Maurine Simmering M.D.   On: 09/07/2020 17:03   DG CHEST PORT 1 VIEW  Result Date: 09/07/2020 CLINICAL DATA:  Shortness of breath. EXAM: PORTABLE CHEST 1 VIEW COMPARISON:  Radiograph 2 days ago 09/05/2020 FINDINGS: Left-sided pacemaker in place. Cardiomegaly is unchanged. Unchanged mediastinal contours. Small bilateral pleural effusions with improvement on the right from prior exam. No pulmonary edema. No new airspace disease. No pneumothorax. Splenic granuloma noted in the left upper quadrant of the abdomen. IMPRESSION: Small bilateral pleural effusions with improvement on the right from prior exam. Stable cardiomegaly. Electronically Signed   By: Keith Rake M.D.   On: 09/07/2020 15:05        Scheduled Meds:  amiodarone  100 mg Oral Daily   apixaban  2.5 mg Oral BID   carvedilol  3.125 mg Oral BID WC   cephALEXin  500 mg Oral BID   cloNIDine  0.2 mg Oral BID   feeding supplement  1 Container Oral Q24H   feeding supplement  237 mL Oral BID BM   ferrous sulfate  325 mg Oral Daily   furosemide  20 mg Intravenous BID   pantoprazole  40 mg Oral BID   predniSONE  5 mg Oral Q breakfast   senna  1 tablet Oral BID   Continuous Infusions:   LOS: 1 day    Time spent: 7mins    Kathie Dike, MD [facility]   If 7PM-7AM, please contact night-coverage [email or web address]  09/07/2020, 7:56 PM

## 2020-09-07 NOTE — Plan of Care (Signed)
  Problem: Clinical Measurements: Goal: Diagnostic test results will improve Outcome: Progressing   Problem: Activity: Goal: Risk for activity intolerance will decrease Outcome: Progressing   Problem: Nutrition: Goal: Adequate nutrition will be maintained Outcome: Progressing   

## 2020-09-08 LAB — BASIC METABOLIC PANEL
Anion gap: 8 (ref 5–15)
BUN: 22 mg/dL (ref 8–23)
CO2: 23 mmol/L (ref 22–32)
Calcium: 8 mg/dL — ABNORMAL LOW (ref 8.9–10.3)
Chloride: 95 mmol/L — ABNORMAL LOW (ref 98–111)
Creatinine, Ser: 1.52 mg/dL — ABNORMAL HIGH (ref 0.44–1.00)
GFR, Estimated: 32 mL/min — ABNORMAL LOW (ref >60)
Glucose, Bld: 84 mg/dL (ref 70–99)
Potassium: 4.4 mmol/L (ref 3.5–5.1)
Sodium: 126 mmol/L — ABNORMAL LOW (ref 135–145)

## 2020-09-08 LAB — CBC
HCT: 27.2 % — ABNORMAL LOW (ref 36.0–46.0)
Hemoglobin: 8.8 g/dL — ABNORMAL LOW (ref 12.0–15.0)
MCH: 28.4 pg (ref 26.0–34.0)
MCHC: 32.4 g/dL (ref 30.0–36.0)
MCV: 87.7 fL (ref 80.0–100.0)
Platelets: 352 10*3/uL (ref 150–400)
RBC: 3.1 MIL/uL — ABNORMAL LOW (ref 3.87–5.11)
RDW: 16 % — ABNORMAL HIGH (ref 11.5–15.5)
WBC: 7.4 10*3/uL (ref 4.0–10.5)
nRBC: 0 % (ref 0.0–0.2)

## 2020-09-08 LAB — MAGNESIUM: Magnesium: 2.2 mg/dL (ref 1.7–2.4)

## 2020-09-08 MED ORDER — FUROSEMIDE 20 MG PO TABS
20.0000 mg | ORAL_TABLET | Freq: Every day | ORAL | Status: DC
  Administered 2020-09-09: 20 mg via ORAL
  Filled 2020-09-08: qty 1

## 2020-09-08 MED ORDER — CARVEDILOL 3.125 MG PO TABS
3.1250 mg | ORAL_TABLET | Freq: Once | ORAL | Status: AC
  Administered 2020-09-08: 3.125 mg via ORAL
  Filled 2020-09-08: qty 1

## 2020-09-08 MED ORDER — CARVEDILOL 6.25 MG PO TABS
6.2500 mg | ORAL_TABLET | Freq: Two times a day (BID) | ORAL | Status: DC
  Administered 2020-09-08 – 2020-09-09 (×2): 6.25 mg via ORAL
  Filled 2020-09-08 (×2): qty 1

## 2020-09-08 NOTE — Progress Notes (Signed)
PROGRESS NOTE    Allison Norman  VEL:[id number] DOB: 1924/10/05 DOA: 09/05/2020 PCP: [name], MD    Brief Narrative:  87 year old with history of paroxysmal A. fib on Eliquis, HTN, polymyalgia rheumatica on chronic prednisone discharged on 08/31/2020 after being here for fall with laceration of the right knee.  At that time developed acute urinary retention, Flomax was started and Foley catheter was placed.  Now coming back to the hospital with generalized fatigue and weakness.  In the ED she was found to be in A. fib with RVR with elevated BNP.   Assessment & Plan:   Active Problems:   Essential hypertension   Polymyalgia rheumatica (HCC)   Atrial fibrillation with RVR (HCC)   CRI (chronic renal insufficiency), stage 4 (severe) (HCC)   PAF (paroxysmal atrial fibrillation) (HCC)   CKD (chronic kidney disease), stage IV (HCC)   Atrial fibrillation (HCC)   Acute urinary retention   Iron deficiency anemia   Atrial fibrillation with RVR, persistent -She is chronically on amiodarone and Coreg -Coreg dose has been adjusted -Overall heart rates are better -She is anticoagulated with Eliquis  Acute urinary retention Possible urinary tract infection -Patient had difficulty with urinary retention during her last hospitalization and was discharged with a Foley catheter -This was discontinued on admission and she has been receiving in and out catheterizations -After 24 hours of in and out catheterizations, she continued to retain urine and Foley catheter was replaced -She will need outpatient follow-up with urology -Urinary retention likely impacted by large stool burden noted on last previous CT -Renal ultrasound did not show any hydronephrosis -Urinalysis indicated possible infection -Urine culture in process -Currently on Keflex  Chronic kidney disease stage IV -Creatinine currently at baseline at 1.5  Acute on chronic diastolic congestive heart failure -Echo from  01/2020 showed ejection fraction of 65 to 70% with grade 3 diastolic dysfunction -She was also noted to have significant pulmonary hypertension with RV systolic pressure of 65 mmHg -Currently, she does feel more short of breath and recent chest imaging did not show bilateral pleural effusions -BNP elevated 874 -Overall volume status has improved with IV Lasix -We will transition to p.o. Lasix  Polymyalgia rheumatica -Continue on home dose of prednisone  Hypertension -Continue on clonidine and Coreg  Anemia of chronic disease, but also likely has a component of iron deficiency -No obvious bleeding -Hemoglobin has been stable  Constipation -Noted on prior CT that she had a large stool burden  -Reports that its been several days since she had a bowel movement -She is having bowel movements after receiving suppository    DVT prophylaxis: apixaban (ELIQUIS) tablet 2.5 mg Start: 09/05/20 2200 apixaban (ELIQUIS) tablet 2.5 mg  Code Status: Full code Family Communication: Discussed with grand daughter at the bedside Disposition Plan: Status is: Inpatient  Remains inpatient appropriate because:Hemodynamically unstable, IV treatments appropriate due to intensity of illness or inability to take PO, and Inpatient level of care appropriate due to severity of illness  Dispo: The patient is from: Home              Anticipated d/c is to: Home              Patient currently is not medically stable to d/c.   Difficult to place patient No         Consultants:    Procedures:    Antimicrobials:  Bactrim 8/31 > 9/2 Keflex 9/2 >   Subjective: She feels that shortness of  breath has improved.  She continues to retain urine overnight and a Foley catheter was placed  Objective: Vitals:   09/08/20 0959 09/08/20 1101 09/08/20 1300 09/08/20 1457  BP: (!) 149/75  (!) 96/52 123/63  Pulse: 99  72 87  Resp:      Temp: 98.4 F (36.9 C)  99.3 F (37.4 C)   TempSrc: Oral  Oral   SpO2:  98% 92% 93% 95%  Weight:      Height:        Intake/Output Summary (Last 24 hours) at 09/08/2020 1958 Last data filed at 09/08/2020 1800 Gross per 24 hour  Intake 240 ml  Output 3875 ml  Net -3635 ml   Filed Weights   09/05/20 1429  Weight: 63.5 kg    Examination:  General exam: Appears calm and comfortable  Respiratory system: Clear to auscultation. Respiratory effort normal. Cardiovascular system: S1 & S2 heard, irregular. No JVD, murmurs, rubs, gallops or clicks. No pedal edema. Gastrointestinal system: Abdomen is nondistended, soft and nontender. No organomegaly or masses felt. Normal bowel sounds heard. Central nervous system: Alert and oriented. No focal neurological deficits. Extremities: Symmetric 5 x 5 power.  Wound on right knee, without any purulent drainage.  Laceration has been repaired.  There are some mild surrounding erythema Skin: No rashes, lesions or ulcers Psychiatry: Judgement and insight appear normal. Mood & affect appropriate.     Data Reviewed: I have personally reviewed following labs and imaging studies  CBC: Recent Labs  Lab 09/02/20 1835 09/05/20 1530 09/06/20 0403 09/07/20 1005 09/08/20 1034  WBC 7.9 8.3 7.3 7.6 7.4  NEUTROABS  --  5.8  --   --   --   HGB 8.1* 8.5* 7.7* 7.9* 8.8*  HCT 26.4* 28.3* 24.4* 25.4* 27.2*  MCV 93.3 94.0 89.4 89.1 87.7  PLT 224 307 287 317 053   Basic Metabolic Panel: Recent Labs  Lab 09/02/20 1835 09/05/20 1530 09/06/20 0403 09/07/20 0401 09/08/20 1034  NA 131* 131* 132* 129* 126*  K 5.1 4.3 4.3 4.8 4.4  CL 96* 96* 100 100 95*  CO2 28 25 24 24 23   GLUCOSE 103* 119* 92 95 84  BUN 33* 34* 28* 28* 22  CREATININE 1.96* 1.92* 1.63* 1.77* 1.52*  CALCIUM 7.9* 8.1* 7.8* 7.7* 8.0*  MG  --  2.7*  --  2.4 2.2   GFR: Estimated Creatinine Clearance: 20.4 mL/min (A) (by C-G formula based on SCr of 1.52 mg/dL (H)). Liver Function Tests: Recent Labs  Lab 09/05/20 1530 09/06/20 0403  AST 25 20  ALT 20 18   ALKPHOS 93 79  BILITOT 1.0 0.7  PROT 6.2* 5.4*  ALBUMIN 3.2* 2.7*   No results for input(s): LIPASE, AMYLASE in the last 168 hours. No results for input(s): AMMONIA in the last 168 hours. Coagulation Profile: No results for input(s): INR, PROTIME in the last 168 hours. Cardiac Enzymes: Recent Labs  Lab 09/05/20 1530  CKTOTAL 87   BNP (last 3 results) No results for input(s): PROBNP in the last 8760 hours. HbA1C: No results for input(s): HGBA1C in the last 72 hours. CBG: No results for input(s): GLUCAP in the last 168 hours. Lipid Profile: No results for input(s): CHOL, HDL, LDLCALC, TRIG, CHOLHDL, LDLDIRECT in the last 72 hours. Thyroid Function Tests: Recent Labs    09/06/20 0908  TSH 3.300   Anemia Panel: No results for input(s): VITAMINB12, FOLATE, FERRITIN, TIBC, IRON, RETICCTPCT in the last 72 hours.  Sepsis Labs: Recent Labs  Lab 09/05/20 1530 09/05/20 1700  LATICACIDVEN 1.1 0.9    Recent Results (from the past 240 hour(s))  Urine Culture     Status: None   Collection Time: 08/31/20  2:22 PM   Specimen: Urine, Clean Catch  Result Value Ref Range Status   Specimen Description URINE, CLEAN CATCH  Final   Special Requests NONE  Final   Culture   Final    NO GROWTH Performed at [facility], [place] [address], [place], [place] [address]    Report Status 09/02/2020 FINAL  Final  Culture, blood (routine x 2)     Status: None (Preliminary result)   Collection Time: 09/05/20  3:30 PM   Specimen: BLOOD RIGHT FOREARM  Result Value Ref Range Status   Specimen Description   Final    BLOOD RIGHT FOREARM Performed at [facility] [address], [place], [place] [address]    Special Requests   Final    BOTTLES DRAWN AEROBIC AND ANAEROBIC Blood Culture adequate volume Performed at [facility] [address] [place], [place] [address]    Culture   Final    NO GROWTH 2 DAYS Performed at [facility] [address] [place], [place] [address]    Report Status PENDING  Incomplete  Resp Panel by RT-PCR (Flu A&B, Covid) Nasopharyngeal Swab     Status: None   Collection Time: 09/05/20  3:30 PM   Specimen: Nasopharyngeal Swab; Nasopharyngeal(NP) swabs in vial transport medium  Result Value Ref Range Status   SARS Coronavirus 2 by RT PCR NEGATIVE NEGATIVE Final    Comment: (NOTE) SARS-CoV-2 target nucleic acids are NOT DETECTED.  The SARS-CoV-2 RNA is generally detectable in upper respiratory specimens during the acute phase of infection. The lowest concentration of SARS-CoV-2 viral copies this assay can detect is 138 copies/mL. A negative result does not preclude SARS-Cov-2 infection and should not be used as the sole basis for treatment or other patient management decisions. A negative result may occur with  improper specimen collection/handling, submission of specimen other than nasopharyngeal swab, presence of viral mutation(s) within the areas targeted by this assay, and inadequate number of viral copies(<138 copies/mL). A negative result must be combined with clinical observations, patient history, and epidemiological information. The expected result is Negative.  Fact Sheet for Patients:  EntrepreneurPulse.com.au  Fact Sheet for Healthcare Providers:  IncredibleEmployment.be  This test is no t yet approved or cleared by the [place] FDA and  has been authorized for detection and/or diagnosis of SARS-CoV-2 by FDA under an Emergency Use Authorization (EUA). This EUA will remain  in effect (meaning this test can be used) for the duration of the COVID-19 declaration under Section 564(b)(1) of the Act, 21 U.S.C.section 360bbb-3(b)(1), unless the authorization is terminated  or revoked sooner.       Influenza A by PCR NEGATIVE NEGATIVE Final   Influenza B by PCR NEGATIVE NEGATIVE Final    Comment: (NOTE) The Xpert Xpress SARS-CoV-2/FLU/RSV plus  assay is intended as an aid in the diagnosis of influenza from Nasopharyngeal swab specimens and should not be used as a sole basis for treatment. Nasal washings and aspirates are unacceptable for Xpert Xpress SARS-CoV-2/FLU/RSV testing.  Fact Sheet for Patients: EntrepreneurPulse.com.au  Fact Sheet for Healthcare Providers: IncredibleEmployment.be  This test is not yet approved or cleared by the [place] FDA and has been authorized for detection and/or diagnosis of SARS-CoV-2 by FDA under an Emergency Use Authorization (  EUA). This EUA will remain in effect (meaning this test can be used) for the duration of the COVID-19 declaration under Section 564(b)(1) of the Act, 21 U.S.C. section 360bbb-3(b)(1), unless the authorization is terminated or revoked.  Performed at [facility], [place] [address], [place], [place] [address]   Culture, blood (routine x 2)     Status: None (Preliminary result)   Collection Time: 09/05/20  5:17 PM   Specimen: BLOOD RIGHT HAND  Result Value Ref Range Status   Specimen Description   Final    BLOOD RIGHT HAND Performed at [facility] [address], [place], [place] [address]    Special Requests   Final    BOTTLES DRAWN AEROBIC AND ANAEROBIC Blood Culture adequate volume Performed at [facility] [address] [place], [place] [address]    Culture   Final    NO GROWTH 2 DAYS Performed at [facility] [address] [place], [place] [address]    Report Status PENDING  Incomplete         Radiology Studies: US RENAL  Result Date: 09/07/2020 CLINICAL DATA:  Acute kidney injury EXAM: RENAL / URINARY TRACT ULTRASOUND COMPLETE COMPARISON:  CT abdomen pelvis 08/30/2020 FINDINGS: Right Kidney: Renal measurements: 8.9 x 3.8 x 4.0 cm = volume: 70.9 mL. No hydronephrosis. There is an anechoic, simple appearing cyst which measures 3.0 x 2.5 x 2.9 cm  in the mid kidney. There is an additional hypoechoic cystic lesion measuring 2.0 x 1.7 x 1.3 cm. Left Kidney: Renal measurements: 9.5 x 4.8 x 4.9 cm = volume: 117.3 mL. No hydronephrosis. There are 2 cystic lesions which measure up to 1.1 and 1.0 cm. The cyst in the mid kidney appears anechoic and simple. The cyst in the lower pole appears hypoechoic. Bladder: Decompressed with Foley catheter in place. Other: None. IMPRESSION: No hydronephrosis. Multiple bilateral renal cysts which are likely simple. Electronically Signed   By: Maurine Simmering M.D.   On: 09/07/2020 17:03   DG CHEST PORT 1 VIEW  Result Date: 09/07/2020 CLINICAL DATA:  Shortness of breath. EXAM: PORTABLE CHEST 1 VIEW COMPARISON:  Radiograph 2 days ago 09/05/2020 FINDINGS: Left-sided pacemaker in place. Cardiomegaly is unchanged. Unchanged mediastinal contours. Small bilateral pleural effusions with improvement on the right from prior exam. No pulmonary edema. No new airspace disease. No pneumothorax. Splenic granuloma noted in the left upper quadrant of the abdomen. IMPRESSION: Small bilateral pleural effusions with improvement on the right from prior exam. Stable cardiomegaly. Electronically Signed   By: Keith Rake M.D.   On: 09/07/2020 15:05        Scheduled Meds:  amiodarone  100 mg Oral Daily   apixaban  2.5 mg Oral BID   carvedilol  6.25 mg Oral BID WC   cephALEXin  500 mg Oral BID   Chlorhexidine Gluconate Cloth  6 each Topical Daily   cloNIDine  0.2 mg Oral BID   feeding supplement  1 Container Oral Q24H   feeding supplement  237 mL Oral BID BM   ferrous sulfate  325 mg Oral Daily   [START ON 09/09/2020] furosemide  20 mg Oral Daily   pantoprazole  40 mg Oral BID   predniSONE  5 mg Oral Q breakfast   senna  1 tablet Oral BID   Continuous Infusions:   LOS: 2 days    Time spent: 45mins    Kathie Dike, MD [facility]   If 7PM-7AM, please contact  night-coverage [email or web address]  09/08/2020, 7:58 PM

## 2020-09-08 NOTE — Progress Notes (Signed)
Pt anxious and was medicated per MAR.  No other issues at this time.  Will implement yellow mews vitals protocol.   09/07/20 2300  Assess: MEWS Score  Temp 98.1 F (36.7 C)  BP 133/90  Pulse Rate (!) 115  Resp 18  SpO2 94 %  O2 Device Nasal Cannula  Assess: MEWS Score  MEWS Temp 0  MEWS Systolic 0  MEWS Pulse 2  MEWS RR 0  MEWS LOC 0  MEWS Score 2  MEWS Score Color Yellow  Assess: if the MEWS score is Yellow or Red  Were vital signs taken at a resting state? Yes  Focused Assessment No change from prior assessment  Does the patient meet 2 or more of the SIRS criteria? No  MEWS guidelines implemented *See Row Information* Yes  Take Vital Signs  Increase Vital Sign Frequency  Yellow: Q 2hr X 2 then Q 4hr X 2, if remains yellow, continue Q 4hrs  Assess: SIRS CRITERIA  SIRS Temperature  0  SIRS Pulse 1  SIRS Respirations  0  SIRS WBC 0  SIRS Score Sum  1

## 2020-09-09 LAB — BASIC METABOLIC PANEL
Anion gap: 6 (ref 5–15)
BUN: 26 mg/dL — ABNORMAL HIGH (ref 8–23)
CO2: 24 mmol/L (ref 22–32)
Calcium: 8 mg/dL — ABNORMAL LOW (ref 8.9–10.3)
Chloride: 103 mmol/L (ref 98–111)
Creatinine, Ser: 1.95 mg/dL — ABNORMAL HIGH (ref 0.44–1.00)
GFR, Estimated: 23 mL/min — ABNORMAL LOW (ref >60)
Glucose, Bld: 88 mg/dL (ref 70–99)
Potassium: 4.2 mmol/L (ref 3.5–5.1)
Sodium: 133 mmol/L — ABNORMAL LOW (ref 135–145)

## 2020-09-09 LAB — URINE CULTURE: Culture: NO GROWTH

## 2020-09-09 LAB — MAGNESIUM: Magnesium: 2.3 mg/dL (ref 1.7–2.4)

## 2020-09-09 MED ORDER — CEPHALEXIN 500 MG PO CAPS: 500.0000 mg | ORAL_CAPSULE | Freq: Two times a day (BID) | ORAL | 0 refills | Status: DC

## 2020-09-09 MED ORDER — CARVEDILOL 6.25 MG PO TABS
6.2500 mg | ORAL_TABLET | Freq: Once | ORAL | Status: AC
  Administered 2020-09-09: 6.25 mg via ORAL

## 2020-09-09 MED ORDER — FUROSEMIDE 40 MG PO TABS: 40.0000 mg | ORAL_TABLET | Freq: Every day | ORAL | 1 refills | Status: DC

## 2020-09-09 MED ORDER — CLONIDINE HCL 0.1 MG PO TABS: 0.2000 mg | ORAL_TABLET | Freq: Two times a day (BID) | ORAL | 0 refills | Status: DC

## 2020-09-09 MED ORDER — PANTOPRAZOLE SODIUM 40 MG PO TBEC: 40.0000 mg | DELAYED_RELEASE_TABLET | Freq: Every day | ORAL | 1 refills | Status: DC

## 2020-09-09 MED ORDER — POLYETHYLENE GLYCOL 3350 17 G PO PACK: 17.0000 g | PACK | Freq: Every day | ORAL | 0 refills | Status: DC

## 2020-09-09 MED ORDER — CARVEDILOL 12.5 MG PO TABS: 12.5000 mg | ORAL_TABLET | Freq: Two times a day (BID) | ORAL | 1 refills | Status: DC

## 2020-09-09 MED ORDER — CARVEDILOL 12.5 MG PO TABS
12.5000 mg | ORAL_TABLET | Freq: Two times a day (BID) | ORAL | Status: DC
  Administered 2020-09-09: 12.5 mg via ORAL
  Filled 2020-09-09 (×2): qty 1

## 2020-09-09 NOTE — TOC Transition Note (Signed)
Transition of Care [facility]) - CM/SW Discharge Note   Patient Details  Name: FARRON LAFOND MRN: [id number] Date of Birth: July 04, 1924  [facility] CM/SW Contact:  Illene Regulus, LCSW Phone Number: 09/09/2020, 3:40 PM   Clinical Narrative:    PT is active with [facility] services, CSW called and confirmed with [facility]. CSW spoke with Zack from [employer] to confirm oxygen order to pt room before to d/c.     Final next level of care: [place] Barriers to Discharge: No Barriers Identified   Patient Goals and CMS Choice     Choice offered to / list presented to : Patient  Discharge Placement                       Discharge Plan and Services                  DME Agency: [employer] Date DME Agency Contacted: 09/09/20 Time DME Agency Contacted: 1165 Representative spoke with at DME Agency: Zack HH Arranged: PT, OT, RN [place] Agency: [place] Date [place]: 09/09/20 Time [place]: 0330 Representative spoke with at [place]: [facility]  Social Determinants of Health ([place]) Interventions     Readmission Risk Interventions No flowsheet data found.

## 2020-09-09 NOTE — Progress Notes (Addendum)
SATURATION QUALIFICATIONS: (This note is used to comply with regulatory documentation for home oxygen)  Patient Saturations on Room Air at Rest = 87%  Patient Saturations on Room Air while Ambulating = 91%  Patient Saturations on 0 Liters of oxygen while Ambulating = 91%  Please briefly explain why patient needs home oxygen: Pt O2 saturation going down to 88% at rest on room air.

## 2020-09-09 NOTE — Discharge Summary (Signed)
Physician Discharge Summary  Allison Norman QQV:[id number] DOB: 1924-06-21 DOA: 09/05/2020  PCP: [name], MD  Admit date: 09/05/2020 Discharge date: 09/09/2020  Admitted From: Home Disposition: Home  Recommendations for Outpatient Follow-up:  Follow-up with cardiology later this month as previously scheduled She has a follow-up with urology scheduled for 9/12  Home Health: Home health PT, OT, RN Equipment/Devices: Home oxygen at 2 L/min  Discharge Condition: Stable CODE STATUS: Full code Diet recommendation: Heart healthy  Brief/Interim Summary: 87 year old with history of paroxysmal A. fib on Eliquis, HTN, polymyalgia rheumatica on chronic prednisone discharged on 08/31/2020 after being here for fall with laceration of the right knee.  At that time developed acute urinary retention, Flomax was started and Foley catheter was placed.  Now coming back to the hospital with generalized fatigue and weakness.  In the ED she was found to be in A. fib with RVR with elevated BNP.  Discharge Diagnoses:  Active Problems:   Essential hypertension   Polymyalgia rheumatica (HCC)   Atrial fibrillation with RVR (HCC)   CRI (chronic renal insufficiency), stage 4 (severe) (HCC)   PAF (paroxysmal atrial fibrillation) (HCC)   CKD (chronic kidney disease), stage IV (HCC)   Atrial fibrillation (HCC)   Acute urinary retention   Iron deficiency anemia  Atrial fibrillation with RVR, persistent -She is chronically on amiodarone and Coreg -Coreg dose has been adjusted -Overall heart rates are better -She is anticoagulated with Eliquis   Acute urinary retention Possible urinary tract infection -Patient had difficulty with urinary retention during her last hospitalization and was discharged with a Foley catheter -This was discontinued on admission and she had been receiving in and out catheterizations -After 24 hours of in and out catheterizations, she continued to retain urine and Foley  catheter was replaced -She already has an outpatient appointment with urology scheduled -Urinary retention likely impacted by large stool burden noted on last previous CT -Renal ultrasound did not show any hydronephrosis -Urinalysis indicated possible infection -Urine culture did not show any growth -Currently on Keflex  Right knee laceration status post recent fall -She was noted to have some erythema around the laceration that had been repaired -Complete course of Keflex   Chronic kidney disease stage IV -Baseline creatinine 1.5-1.9 -Currently creatinine at 1.9   Acute on chronic diastolic congestive heart failure -Echo from 01/2020 showed ejection fraction of 65 to 70% with grade 3 diastolic dysfunction -She was also noted to have significant pulmonary hypertension with RV systolic pressure of 65 mmHg -Currently, she does feel more short of breath and recent chest imaging did not show bilateral pleural effusions -BNP elevated 874 -Overall volume status has improved with IV Lasix -She reports being compliant with her home dose of Lasix 20 mg daily prior to admission -We will increase home dose to 40 mg daily -Advised patient to check her weights daily -The patient did qualify for home oxygen.   Polymyalgia rheumatica -Continue on home dose of prednisone   Hypertension -Continue on clonidine and Coreg -Discontinue Norvasc -Clonidine dose is being reduced with the hopes of titrating it off   Anemia of chronic disease, but also likely has a component of iron deficiency -No obvious bleeding -Hemoglobin has been stable   Constipation -Noted on prior CT that she had a large stool burden  -Reports that its been several days since she had a bowel movement -She began having bowel movements after receiving suppository -Continue on daily MiraLAX  Discharge Instructions  Discharge Instructions  Diet - low sodium heart healthy   Complete by: As directed    Discharge wound  care:   Complete by: As directed    Keep incision clean, neosporin twice a day, cover with dry gauze, wrap with kerlix   Increase activity slowly   Complete by: As directed       Allergies as of 09/09/2020       Reactions   Codeine Itching   Penicillins Itching, Rash, Other (See Comments)   Has patient had a PCN reaction causing immediate rash, facial/tongue/throat swelling, SOB or lightheadedness with hypotension: Yes Has patient had a PCN reaction causing severe rash involving mucus membranes or skin necrosis: No Has patient had a PCN reaction that required hospitalization: No Has patient had a PCN reaction occurring within the last 10 years: No If all of the above answers are "NO", then may proceed with Cephalosporin use.   Doxazosin    Heavy feeling in chest, congestion, wheezing   Doxycycline Other (See Comments)   Cause chest tightness   Doxycycline Hyclate Other (See Comments)   Hydralazine    Felt bad and could hear pulse in head         Medication List     STOP taking these medications    amLODipine 5 MG tablet Commonly known as: NORVASC   senna 8.6 MG Tabs tablet Commonly known as: SENOKOT   tamsulosin 0.4 MG Caps capsule Commonly known as: FLOMAX       TAKE these medications    acetaminophen 500 MG tablet Commonly known as: TYLENOL Take 1,000 mg by mouth every 6 (six) hours as needed for moderate pain or headache.   amiodarone 200 MG tablet Commonly known as: PACERONE Take 0.5 tablets (100 mg total) by mouth daily.   carvedilol 12.5 MG tablet Commonly known as: COREG Take 1 tablet (12.5 mg total) by mouth 2 (two) times daily with a meal. What changed:  medication strength how much to take   cephALEXin 500 MG capsule Commonly known as: KEFLEX Take 1 capsule (500 mg total) by mouth 2 (two) times daily.   cloNIDine 0.1 MG tablet Commonly known as: CATAPRES Take 2 tablets (0.2 mg total) by mouth 2 (two) times daily. What changed: medication  strength   diazepam 2 MG tablet Commonly known as: VALIUM Take 2 mg by mouth at bedtime as needed for anxiety (for sleep).   Eliquis 2.5 MG Tabs tablet Generic drug: apixaban Take 1 tablet by mouth twice daily What changed: how much to take   Eylea 2 MG/0.05ML Soln Generic drug: Aflibercept 2 mg by Intravitreal route every 3 (three) months. Inject in R eye every 11 weeks per Retina specialist for Macular Degenration   ferrous sulfate 325 (65 FE) MG tablet Take 1 tablet (325 mg total) by mouth daily.   furosemide 40 MG tablet Commonly known as: LASIX Take 1 tablet (40 mg total) by mouth daily. What changed:  medication strength how much to take   pantoprazole 40 MG tablet Commonly known as: PROTONIX Take 1 tablet (40 mg total) by mouth daily. What changed: when to take this   polyethylene glycol 17 g packet Commonly known as: MIRALAX / GLYCOLAX Take 17 g by mouth daily. What changed: when to take this   predniSONE 5 MG tablet Commonly known as: DELTASONE Take 5 mg by mouth daily as needed (pain).   PreserVision AREDS 2 Caps Take 1 capsule by mouth daily.   traMADol 50 MG tablet Commonly known as:  ULTRAM Take 0.5 tablets (25 mg total) by mouth every 6 (six) hours as needed for moderate pain.   VITAMIN-B COMPLEX PO Take 1 tablet by mouth daily.               Durable Medical Equipment  (From admission, onward)           Start     Ordered   09/09/20 1556  For home use only DME oxygen  Once       Question Answer Comment  Length of Need Lifetime   Mode or (Route) Nasal cannula   Liters per Minute 2   Frequency Continuous (stationary and portable oxygen unit needed)   Oxygen conserving device Yes   Oxygen delivery system Gas      09/09/20 1556              Discharge Care Instructions  (From admission, onward)           Start     Ordered   09/09/20 0000  Discharge wound care:       Comments: Keep incision clean, neosporin twice a day,  cover with dry gauze, wrap with kerlix   09/09/20 1426            Allergies  Allergen Reactions   Codeine Itching   Penicillins Itching, Rash and Other (See Comments)    Has patient had a PCN reaction causing immediate rash, facial/tongue/throat swelling, SOB or lightheadedness with hypotension: Yes Has patient had a PCN reaction causing severe rash involving mucus membranes or skin necrosis: No Has patient had a PCN reaction that required hospitalization: No Has patient had a PCN reaction occurring within the last 10 years: No If all of the above answers are "NO", then may proceed with Cephalosporin use.   Doxazosin     Heavy feeling in chest, congestion, wheezing   Doxycycline Other (See Comments)    Cause chest tightness   Doxycycline Hyclate Other (See Comments)   Hydralazine     Felt bad and could hear pulse in head     Consultations:    Procedures/Studies: CT ABDOMEN PELVIS WO CONTRAST  Result Date: 08/30/2020 CLINICAL DATA:  Fall, anemia EXAM: CT ABDOMEN AND PELVIS WITHOUT CONTRAST TECHNIQUE: Multidetector CT imaging of the abdomen and pelvis was performed following the standard protocol without IV contrast. COMPARISON:  CT abdomen/pelvis 03/12/2007 FINDINGS: Lower chest: There is scarring in the lung bases. There is a trace right pleural effusion with adjacent subsegmental atelectasis. The heart is markedly enlarged are dense mitral annular calcifications and aortic valve calcifications, incompletely imaged. Cardiac device leads are partially imaged. Hepatobiliary: The liver parenchyma is normal in appearance. There is marked dilation of the intrahepatic IVC and hepatic veins. No focal lesions are identified. The gallbladder is markedly distended without wall thickening or pericholecystic fluid. The common bile duct is borderline enlarged at 9-10 mm. Pancreas: Unremarkable. Spleen: Innumerable calcified granulomas are seen throughout the spleen, unchanged. Adrenals/Urinary  Tract: The adrenals are unremarkable. Hypodense lesions in the right kidneys are likely cysts, measuring up to 2.5 cm. There are no other focal lesions, within the confines of noncontrast technique. There are no stones. There is no hydronephrosis or hydroureter. The bladder is markedly distended. Stomach/Bowel: The stomach is grossly unremarkable. There is no evidence of bowel obstruction. There is a large colonic stool burden. There is no evidence of abnormal bowel wall thickening or inflammatory change. Vascular/Lymphatic: There is extensive calcification throughout the nonaneurysmal abdominal aorta. There is no abdominal  or pelvic lymphadenopathy. Reproductive: The uterus and adnexa are unremarkable. Other: There is no ascites or free air. There is no evidence of retroperitoneal hematoma. Musculoskeletal: There is mild dextroscoliosis of the lumbar spine. There is mild multilevel degenerative change of the lumbar spine. There is no acute osseous abnormality or aggressive osseous lesion. IMPRESSION: 1. No evidence of traumatic injury in the abdomen or pelvis; no evidence of retroperitoneal hematoma. 2. Large colonic stool burden without evidence of mechanical obstruction. 3. Marked distention of the gallbladder without additional findings of acute cholecystitis and borderline dilated common bile duct with no obstructing lesions seen. If acute cholecystitis is of clinical concern, HIDA scan may be obtained. 4. Marked distention of the urinary bladder. Consider decompression. 5. Four-chamber cardiomegaly with significant dilation of the IVC and hepatic veins suggesting right heart failure. 6. Trace right pleural effusion. 7. Dense mitral annular calcifations. Electronically Signed   By: Valetta Mole M.D.   On: 08/30/2020 14:23   DG Knee 2 Views Right  Result Date: 08/27/2020 CLINICAL DATA:  History of fall EXAM: RIGHT KNEE - 1-2 VIEW COMPARISON:  None. FINDINGS: No acute fracture or dislocation. Status post  total right knee replacement with no evidence of periprosthetic lucency or fracture. Cannot evaluate for pleural effusion due to overlying bandage. IMPRESSION: No acute fracture or dislocation. Electronically Signed   By: Yetta Glassman M.D.   On: 08/27/2020 16:28   US RENAL  Result Date: 09/07/2020 CLINICAL DATA:  Acute kidney injury EXAM: RENAL / URINARY TRACT ULTRASOUND COMPLETE COMPARISON:  CT abdomen pelvis 08/30/2020 FINDINGS: Right Kidney: Renal measurements: 8.9 x 3.8 x 4.0 cm = volume: 70.9 mL. No hydronephrosis. There is an anechoic, simple appearing cyst which measures 3.0 x 2.5 x 2.9 cm in the mid kidney. There is an additional hypoechoic cystic lesion measuring 2.0 x 1.7 x 1.3 cm. Left Kidney: Renal measurements: 9.5 x 4.8 x 4.9 cm = volume: 117.3 mL. No hydronephrosis. There are 2 cystic lesions which measure up to 1.1 and 1.0 cm. The cyst in the mid kidney appears anechoic and simple. The cyst in the lower pole appears hypoechoic. Bladder: Decompressed with Foley catheter in place. Other: None. IMPRESSION: No hydronephrosis. Multiple bilateral renal cysts which are likely simple. Electronically Signed   By: Maurine Simmering M.D.   On: 09/07/2020 17:03   DG CHEST PORT 1 VIEW  Result Date: 09/07/2020 CLINICAL DATA:  Shortness of breath. EXAM: PORTABLE CHEST 1 VIEW COMPARISON:  Radiograph 2 days ago 09/05/2020 FINDINGS: Left-sided pacemaker in place. Cardiomegaly is unchanged. Unchanged mediastinal contours. Small bilateral pleural effusions with improvement on the right from prior exam. No pulmonary edema. No new airspace disease. No pneumothorax. Splenic granuloma noted in the left upper quadrant of the abdomen. IMPRESSION: Small bilateral pleural effusions with improvement on the right from prior exam. Stable cardiomegaly. Electronically Signed   By: Keith Rake M.D.   On: 09/07/2020 15:05   DG Chest Port 1 View  Result Date: 09/05/2020 CLINICAL DATA:  Generalized weakness, fatigue EXAM:  PORTABLE CHEST 1 VIEW COMPARISON:  07/22/2019 FINDINGS: Rotated examination. Cardiomegaly with left chest multi lead pacer. Small, layering bilateral pleural effusions. IMPRESSION: 1. Small, layering bilateral pleural effusions. 2. Cardiomegaly. Electronically Signed   By: Eddie Candle M.D.   On: 09/05/2020 15:19   Intravitreal Injection, Pharmacologic Agent - OD - Right Eye  Result Date: 08/14/2020 Time Out 08/14/2020. 3:11 PM. Confirmed correct patient, procedure, site, and patient consented. Anesthesia Topical anesthesia was used. Anesthetic medications included  Akten 3.5%. Procedure Preparation included Tobramycin 0.3%, 10% betadine to eyelids, 5% betadine to ocular surface, Ofloxacin . A 30 gauge needle was used. Injection: 2 mg aflibercept 2 MG/0.05ML   Route: Intravitreal, Site: Right Eye   NDC: [id number], Lot: [id number], Waste: 0 mL Post-op Post injection exam found visual acuity of at least counting fingers. The patient tolerated the procedure well. There were no complications. The patient received written and verbal post procedure care education. Post injection medications were not given.   OCT, Retina - OU - Both Eyes  Result Date: 08/14/2020 Right Eye Quality was good. Scan locations included subfoveal. Central Foveal Thickness: 290. Progression has improved. Findings include intraretinal fluid, vitreomacular adhesion , pigment epithelial detachment, subretinal scarring, disciform scar. Left Eye Quality was good. Scan locations included subfoveal. Central Foveal Thickness: 296. Progression has been stable. Findings include no SRF, retinal drusen , outer retinal atrophy, central retinal atrophy, subretinal hyper-reflective material, subretinal scarring. Notes Pigment epithelial detachment with intraretinal fluid nasal to the fovea OD is stable,, at 11-week interval follow-up today, repeat injection Eylea today examination next 12 weeks OS with advanced dry atrophic ARMD stable, no change in  thickness OS, the computer selected lower baseline     Subjective: She is feeling better.  Feels her shortness of breath has improved.  She is having bowel movements.  Discharge Exam: Vitals:   09/08/20 2104 09/09/20 0500 09/09/20 1032 09/09/20 1400  BP: 128/76 (!) 141/87 119/72 (!) 104/59  Pulse: 82 (!) 105 93 87  Resp: 18 18  18   Temp: 98.4 F (36.9 C) 97.8 F (36.6 C)  98.8 F (37.1 C)  TempSrc: Oral Axillary  Oral  SpO2: 95% 96% 94% 91%  Weight:      Height:        General: Pt is alert, awake, not in acute distress Cardiovascular: RRR, S1/S2 +, no rubs, no gallops Respiratory: CTA bilaterally, no wheezing, no rhonchi Abdominal: Soft, NT, ND, bowel sounds + Extremities: no edema, no cyanosis    The results of significant diagnostics from this hospitalization (including imaging, microbiology, ancillary and laboratory) are listed below for reference.     Microbiology: Recent Results (from the past 240 hour(s))  Urine Culture     Status: None   Collection Time: 08/31/20  2:22 PM   Specimen: Urine, Clean Catch  Result Value Ref Range Status   Specimen Description URINE, CLEAN CATCH  Final   Special Requests NONE  Final   Culture   Final    NO GROWTH Performed at [facility], [address], [place], [place] [address]    Report Status 09/02/2020 FINAL  Final  Culture, blood (routine x 2)     Status: None (Preliminary result)   Collection Time: 09/05/20  3:30 PM   Specimen: BLOOD RIGHT FOREARM  Result Value Ref Range Status   Specimen Description   Final    BLOOD RIGHT FOREARM Performed at [place] [address] [place], [place] [address]    Special Requests   Final    BOTTLES DRAWN AEROBIC AND ANAEROBIC Blood Culture adequate volume Performed at [facility] [address], [place], [place] [address]    Culture   Final    NO GROWTH 3 DAYS Performed at [facility], [place] [address], [place],  [place] [address]    Report Status PENDING  Incomplete  Resp Panel by RT-PCR (Flu A&B, Covid) Nasopharyngeal Swab     Status: None  Collection Time: 09/05/20  3:30 PM   Specimen: Nasopharyngeal Swab; Nasopharyngeal(NP) swabs in vial transport medium  Result Value Ref Range Status   SARS Coronavirus 2 by RT PCR NEGATIVE NEGATIVE Final    Comment: (NOTE) SARS-CoV-2 target nucleic acids are NOT DETECTED.  The SARS-CoV-2 RNA is generally detectable in upper respiratory specimens during the acute phase of infection. The lowest concentration of SARS-CoV-2 viral copies this assay can detect is 138 copies/mL. A negative result does not preclude SARS-Cov-2 infection and should not be used as the sole basis for treatment or other patient management decisions. A negative result may occur with  improper specimen collection/handling, submission of specimen other than nasopharyngeal swab, presence of viral mutation(s) within the areas targeted by this assay, and inadequate number of viral copies(<138 copies/mL). A negative result must be combined with clinical observations, patient history, and epidemiological information. The expected result is Negative.  Fact Sheet for Patients:  EntrepreneurPulse.com.au  Fact Sheet for Healthcare Providers:  IncredibleEmployment.be  This test is no t yet approved or cleared by the [place] FDA and  has been authorized for detection and/or diagnosis of SARS-CoV-2 by FDA under an Emergency Use Authorization (EUA). This EUA will remain  in effect (meaning this test can be used) for the duration of the COVID-19 declaration under Section 564(b)(1) of the Act, 21 U.S.C.section 360bbb-3(b)(1), unless the authorization is terminated  or revoked sooner.       Influenza A by PCR NEGATIVE NEGATIVE Final   Influenza B by PCR NEGATIVE NEGATIVE Final    Comment: (NOTE) The Xpert Xpress SARS-CoV-2/FLU/RSV plus assay is intended as an  aid in the diagnosis of influenza from Nasopharyngeal swab specimens and should not be used as a sole basis for treatment. Nasal washings and aspirates are unacceptable for Xpert Xpress SARS-CoV-2/FLU/RSV testing.  Fact Sheet for Patients: EntrepreneurPulse.com.au  Fact Sheet for Healthcare Providers: IncredibleEmployment.be  This test is not yet approved or cleared by the [place] FDA and has been authorized for detection and/or diagnosis of SARS-CoV-2 by FDA under an Emergency Use Authorization (EUA). This EUA will remain in effect (meaning this test can be used) for the duration of the COVID-19 declaration under Section 564(b)(1) of the Act, 21 U.S.C. section 360bbb-3(b)(1), unless the authorization is terminated or revoked.  Performed at [facility], [place] [address], [place], [place] [address]   Culture, blood (routine x 2)     Status: None (Preliminary result)   Collection Time: 09/05/20  5:17 PM   Specimen: BLOOD RIGHT HAND  Result Value Ref Range Status   Specimen Description   Final    BLOOD RIGHT HAND Performed at [place] [address], [place], [place] [address]    Special Requests   Final    BOTTLES DRAWN AEROBIC AND ANAEROBIC Blood Culture adequate volume Performed at [place] [address] [place], [place] [address]    Culture   Final    NO GROWTH 3 DAYS Performed at [facility], [place] [address], [place], [place] [address]    Report Status PENDING  Incomplete  Urine Culture     Status: None   Collection Time: 09/08/20 12:12 AM   Specimen: Urine, Clean Catch  Result Value Ref Range Status   Specimen Description   Final    URINE, CLEAN CATCH Performed at [facility], [place] [address] [place],  [address]    Special Requests   Final  NONE Performed at [facility], [place] [address]  [place], [place] [address]    Culture   Final    NO GROWTH Performed at [facility], [place] [address], [place] [employer] [place], [place] [address]    Report Status 09/09/2020 FINAL  Final     Labs: BNP (last 3 results) Recent Labs    09/05/20 1530  BNP 128.7*   Basic Metabolic Panel: Recent Labs  Lab 09/05/20 1530 09/06/20 0403 09/07/20 0401 09/08/20 1034 09/09/20 0344  NA 131* 132* 129* 126* 133*  K 4.3 4.3 4.8 4.4 4.2  CL 96* 100 100 95* 103  CO2 25 24 24 23 24   GLUCOSE 119* 92 95 84 88  BUN 34* 28* 28* 22 26*  CREATININE 1.92* 1.63* 1.77* 1.52* 1.95*  CALCIUM 8.1* 7.8* 7.7* 8.0* 8.0*  MG 2.7*  --  2.4 2.2 2.3   Liver Function Tests: Recent Labs  Lab 09/05/20 1530 09/06/20 0403  AST 25 20  ALT 20 18  ALKPHOS 93 79  BILITOT 1.0 0.7  PROT 6.2* 5.4*  ALBUMIN 3.2* 2.7*   No results for input(s): LIPASE, AMYLASE in the last 168 hours. No results for input(s): AMMONIA in the last 168 hours. CBC: Recent Labs  Lab 09/05/20 1530 09/06/20 0403 09/07/20 1005 09/08/20 1034  WBC 8.3 7.3 7.6 7.4  NEUTROABS 5.8  --   --   --   HGB 8.5* 7.7* 7.9* 8.8*  HCT 28.3* 24.4* 25.4* 27.2*  MCV 94.0 89.4 89.1 87.7  PLT 307 287 317 352   Cardiac Enzymes: Recent Labs  Lab 09/05/20 1530  CKTOTAL 87   BNP: Invalid input(s): POCBNP CBG: No results for input(s): GLUCAP in the last 168 hours. D-Dimer No results for input(s): DDIMER in the last 72 hours. Hgb A1c No results for input(s): HGBA1C in the last 72 hours. Lipid Profile No results for input(s): CHOL, HDL, LDLCALC, TRIG, CHOLHDL, LDLDIRECT in the last 72 hours. Thyroid function studies No results for input(s): TSH, T4TOTAL, T3FREE, THYROIDAB in the last 72 hours.  Invalid input(s): FREET3 Anemia work up No results for input(s): VITAMINB12, FOLATE, FERRITIN, TIBC, IRON, RETICCTPCT in the last 72 hours. Urinalysis    Component Value Date/Time   COLORURINE YELLOW 09/05/2020 1918   APPEARANCEUR HAZY (A) 09/05/2020 1918    LABSPEC 1.010 09/05/2020 1918   PHURINE 7.5 09/05/2020 1918   GLUCOSEU NEGATIVE 09/05/2020 1918   HGBUR LARGE (A) 09/05/2020 1918   BILIRUBINUR NEGATIVE 09/05/2020 1918   KETONESUR NEGATIVE 09/05/2020 1918   PROTEINUR 30 (A) 09/05/2020 1918   UROBILINOGEN 0.2 08/14/2010 1245   NITRITE POSITIVE (A) 09/05/2020 1918   LEUKOCYTESUR LARGE (A) 09/05/2020 1918   Sepsis Labs Invalid input(s): PROCALCITONIN,  WBC,  LACTICIDVEN Microbiology Recent Results (from the past 240 hour(s))  Urine Culture     Status: None   Collection Time: 08/31/20  2:22 PM   Specimen: Urine, Clean Catch  Result Value Ref Range Status   Specimen Description URINE, CLEAN CATCH  Final   Special Requests NONE  Final   Culture   Final    NO GROWTH Performed at [facility], [place] [address], [place], [place] [address]    Report Status 09/02/2020 FINAL  Final  Culture, blood (routine x 2)     Status: None (Preliminary result)   Collection Time: 09/05/20  3:30 PM   Specimen: BLOOD RIGHT FOREARM  Result Value Ref Range Status   Specimen Description   Final  BLOOD RIGHT FOREARM Performed at [place] [address], [place], [place] [place] [id number]    Special Requests   Final    BOTTLES DRAWN AEROBIC AND ANAEROBIC Blood Culture adequate volume Performed at [facility] [address], [place], [place] [address]    Culture   Final    NO GROWTH 3 DAYS Performed at [facility], [place] [address] [place], [place] [address]    Report Status PENDING  Incomplete  Resp Panel by RT-PCR (Flu A&B, Covid) Nasopharyngeal Swab     Status: None   Collection Time: 09/05/20  3:30 PM   Specimen: Nasopharyngeal Swab; Nasopharyngeal(NP) swabs in vial transport medium  Result Value Ref Range Status   SARS Coronavirus 2 by RT PCR NEGATIVE NEGATIVE Final    Comment: (NOTE) SARS-CoV-2 target nucleic acids are NOT DETECTED.  The SARS-CoV-2 RNA is generally detectable in upper  respiratory specimens during the acute phase of infection. The lowest concentration of SARS-CoV-2 viral copies this assay can detect is 138 copies/mL. A negative result does not preclude SARS-Cov-2 infection and should not be used as the sole basis for treatment or other patient management decisions. A negative result may occur with  improper specimen collection/handling, submission of specimen other than nasopharyngeal swab, presence of viral mutation(s) within the areas targeted by this assay, and inadequate number of viral copies(<138 copies/mL). A negative result must be combined with clinical observations, patient history, and epidemiological information. The expected result is Negative.  Fact Sheet for Patients:  EntrepreneurPulse.com.au  Fact Sheet for Healthcare Providers:  IncredibleEmployment.be  This test is no t yet approved or cleared by the [place] FDA and  has been authorized for detection and/or diagnosis of SARS-CoV-2 by FDA under an Emergency Use Authorization (EUA). This EUA will remain  in effect (meaning this test can be used) for the duration of the COVID-19 declaration under Section 564(b)(1) of the Act, 21 U.S.C.section 360bbb-3(b)(1), unless the authorization is terminated  or revoked sooner.       Influenza A by PCR NEGATIVE NEGATIVE Final   Influenza B by PCR NEGATIVE NEGATIVE Final    Comment: (NOTE) The Xpert Xpress SARS-CoV-2/FLU/RSV plus assay is intended as an aid in the diagnosis of influenza from Nasopharyngeal swab specimens and should not be used as a sole basis for treatment. Nasal washings and aspirates are unacceptable for Xpert Xpress SARS-CoV-2/FLU/RSV testing.  Fact Sheet for Patients: EntrepreneurPulse.com.au  Fact Sheet for Healthcare Providers: IncredibleEmployment.be  This test is not yet approved or cleared by the [place] FDA and has been  authorized for detection and/or diagnosis of SARS-CoV-2 by FDA under an Emergency Use Authorization (EUA). This EUA will remain in effect (meaning this test can be used) for the duration of the COVID-19 declaration under Section 564(b)(1) of the Act, 21 U.S.C. section 360bbb-3(b)(1), unless the authorization is terminated or revoked.  Performed at [facility], [place] [address], [place], [place] [address]   Culture, blood (routine x 2)     Status: None (Preliminary result)   Collection Time: 09/05/20  5:17 PM   Specimen: BLOOD RIGHT HAND  Result Value Ref Range Status   Specimen Description   Final    BLOOD RIGHT HAND Performed at [facility] [address], [place], [place] [address]    Special Requests   Final    BOTTLES DRAWN AEROBIC AND ANAEROBIC Blood Culture adequate volume Performed at [facility]  [address] [place], [place] [address]    Culture   Final    NO GROWTH 3 DAYS Performed at [facility], [place] [address] [place], [place] [address]    Report Status PENDING  Incomplete  Urine Culture     Status: None   Collection Time: 09/08/20 12:12 AM   Specimen: Urine, Clean Catch  Result Value Ref Range Status   Specimen Description   Final    URINE, CLEAN CATCH Performed at [facility], [place] [address], [place], [place] [address]    Special Requests   Final    NONE Performed at [facility], [place] [address] [place], [place] [address]    Culture   Final    NO GROWTH Performed at [facility], [place] [address], [place], [place] [address]    Report Status 09/09/2020 FINAL  Final     Time coordinating discharge: 16mins  SIGNED:   Kathie Dike, MD  [facility] 09/09/2020, 7:58 PM   If 7PM-7AM, please contact night-coverage [email or web address]

## 2020-09-09 NOTE — Progress Notes (Signed)
AVS given to patient and explained at the bedside. Medications and follow up appointments have been explained with pt verbalizing understanding.  

## 2020-09-11 DIAGNOSIS — T148XXD Other injury of unspecified body region, subsequent encounter: Secondary | ICD-10-CM | POA: Diagnosis not present

## 2020-09-11 LAB — CULTURE, BLOOD (ROUTINE X 2)
Culture: NO GROWTH
Culture: NO GROWTH
Special Requests: ADEQUATE
Special Requests: ADEQUATE

## 2020-09-14 DIAGNOSIS — Z950 Presence of cardiac pacemaker: Secondary | ICD-10-CM | POA: Diagnosis not present

## 2020-09-14 DIAGNOSIS — I499 Cardiac arrhythmia, unspecified: Secondary | ICD-10-CM | POA: Diagnosis not present

## 2020-09-14 DIAGNOSIS — I129 Hypertensive chronic kidney disease with stage 1 through stage 4 chronic kidney disease, or unspecified chronic kidney disease: Secondary | ICD-10-CM | POA: Diagnosis not present

## 2020-09-14 DIAGNOSIS — R339 Retention of urine, unspecified: Secondary | ICD-10-CM | POA: Diagnosis not present

## 2020-09-14 DIAGNOSIS — D62 Acute posthemorrhagic anemia: Secondary | ICD-10-CM | POA: Diagnosis not present

## 2020-09-14 DIAGNOSIS — M353 Polymyalgia rheumatica: Secondary | ICD-10-CM | POA: Diagnosis not present

## 2020-09-14 DIAGNOSIS — M810 Age-related osteoporosis without current pathological fracture: Secondary | ICD-10-CM | POA: Diagnosis not present

## 2020-09-14 DIAGNOSIS — N184 Chronic kidney disease, stage 4 (severe): Secondary | ICD-10-CM | POA: Diagnosis not present

## 2020-09-14 DIAGNOSIS — Z85828 Personal history of other malignant neoplasm of skin: Secondary | ICD-10-CM | POA: Diagnosis not present

## 2020-09-14 DIAGNOSIS — Z87891 Personal history of nicotine dependence: Secondary | ICD-10-CM | POA: Diagnosis not present

## 2020-09-14 DIAGNOSIS — K5900 Constipation, unspecified: Secondary | ICD-10-CM | POA: Diagnosis not present

## 2020-09-14 DIAGNOSIS — H3530 Unspecified macular degeneration: Secondary | ICD-10-CM | POA: Diagnosis not present

## 2020-09-14 DIAGNOSIS — Z9181 History of falling: Secondary | ICD-10-CM | POA: Diagnosis not present

## 2020-09-14 DIAGNOSIS — S81001D Unspecified open wound, right knee, subsequent encounter: Secondary | ICD-10-CM | POA: Diagnosis not present

## 2020-09-14 DIAGNOSIS — Z9981 Dependence on supplemental oxygen: Secondary | ICD-10-CM | POA: Diagnosis not present

## 2020-09-14 DIAGNOSIS — Z7901 Long term (current) use of anticoagulants: Secondary | ICD-10-CM | POA: Diagnosis not present

## 2020-09-14 DIAGNOSIS — Z853 Personal history of malignant neoplasm of breast: Secondary | ICD-10-CM | POA: Diagnosis not present

## 2020-09-14 DIAGNOSIS — I4819 Other persistent atrial fibrillation: Secondary | ICD-10-CM | POA: Diagnosis not present

## 2020-09-18 DIAGNOSIS — R338 Other retention of urine: Secondary | ICD-10-CM | POA: Diagnosis not present

## 2020-09-27 ENCOUNTER — Other Ambulatory Visit: Payer: Self-pay

## 2020-09-27 ENCOUNTER — Ambulatory Visit (HOSPITAL_BASED_OUTPATIENT_CLINIC_OR_DEPARTMENT_OTHER): Payer: Medicare Other | Admitting: Cardiovascular Disease

## 2020-09-27 ENCOUNTER — Encounter: Payer: Medicare Other | Attending: Physician Assistant | Admitting: Physician Assistant

## 2020-09-27 DIAGNOSIS — I480 Paroxysmal atrial fibrillation: Secondary | ICD-10-CM | POA: Diagnosis not present

## 2020-09-27 DIAGNOSIS — L98492 Non-pressure chronic ulcer of skin of other sites with fat layer exposed: Secondary | ICD-10-CM | POA: Insufficient documentation

## 2020-09-27 DIAGNOSIS — L97812 Non-pressure chronic ulcer of other part of right lower leg with fat layer exposed: Secondary | ICD-10-CM | POA: Diagnosis not present

## 2020-09-27 DIAGNOSIS — Z7901 Long term (current) use of anticoagulants: Secondary | ICD-10-CM | POA: Insufficient documentation

## 2020-09-28 DIAGNOSIS — I4819 Other persistent atrial fibrillation: Secondary | ICD-10-CM | POA: Diagnosis not present

## 2020-09-28 DIAGNOSIS — D62 Acute posthemorrhagic anemia: Secondary | ICD-10-CM | POA: Diagnosis not present

## 2020-09-28 DIAGNOSIS — H3530 Unspecified macular degeneration: Secondary | ICD-10-CM | POA: Diagnosis not present

## 2020-09-28 DIAGNOSIS — N184 Chronic kidney disease, stage 4 (severe): Secondary | ICD-10-CM | POA: Diagnosis not present

## 2020-09-28 DIAGNOSIS — I129 Hypertensive chronic kidney disease with stage 1 through stage 4 chronic kidney disease, or unspecified chronic kidney disease: Secondary | ICD-10-CM | POA: Diagnosis not present

## 2020-09-28 DIAGNOSIS — S81001D Unspecified open wound, right knee, subsequent encounter: Secondary | ICD-10-CM | POA: Diagnosis not present

## 2020-09-28 NOTE — Progress Notes (Signed)
KHRISTIAN, SEALS ([id number]) Visit Report for 09/27/2020 Abuse/Suicide Risk Screen Details Patient Name: Allison Norman, Allison Norman. Date of Service: 09/27/2020 10:15 AM Medical Record Number: [id number] Patient Account Number: [id number] Date of Birth/Sex: 01/10/1924 (87 y.o. F) Treating RN: Donnamarie Poag Primary Care Salome Hautala: Lona Kettle Other Clinician: Referring Latoya Maulding: Lona Kettle Treating Emalene Welte/Extender: Skipper Cliche in Treatment: 0 Abuse/Suicide Risk Screen Items Answer ABUSE RISK SCREEN: Has anyone close to you tried to hurt or harm you recentlyo No Do you feel uncomfortable with anyone in your familyo No Has anyone forced you do things that you didnot want to doo No Electronic Signature(s) Signed: 09/28/2020 3:42:23 PM By: Donnamarie Poag Entered By: Donnamarie Poag on 09/27/2020 10:47:12 Pana, [name] ([id number]) -------------------------------------------------------------------------------- Activities of Daily Living Details Patient Name: Allison Norman, Allison Norman. Date of Service: 09/27/2020 10:15 AM Medical Record Number: [id number] Patient Account Number: [id number] Date of Birth/Sex: 05-09-1924 (87 y.o. F) Treating RN: Donnamarie Poag Primary Care Eloise Mula: Lona Kettle Other Clinician: Referring Taeveon Keesling: Lona Kettle Treating Latonia Conrow/Extender: Skipper Cliche in Treatment: 0 Activities of Daily Living Items Answer Activities of Daily Living (Please select one for each item) Drive Automobile Not Able Take Medications Need Assistance Use Telephone Need Assistance Care for Appearance Need Assistance Use Toilet Need Assistance Bath / Shower Need Assistance Dress Self Need Assistance Feed Self Need Assistance Walk Need Assistance Get In / Out Bed Need Assistance Housework Not Able Prepare Meals Need Assistance Handle Money Need Assistance Shop for Self Not Able Electronic Signature(s) Signed: 09/28/2020 3:42:23 PM By: Donnamarie Poag Entered By: Donnamarie Poag on 09/27/2020  10:45:08 Allison Norman ([id number]) -------------------------------------------------------------------------------- Education Screening Details Patient Name: Allison Norman Date of Service: 09/27/2020 10:15 AM Medical Record Number: [id number] Patient Account Number: [id number] Date of Birth/Sex: 07-12-1924 (87 y.o. F) Treating RN: Donnamarie Poag Primary Care Shelsea Hangartner: Lona Kettle Other Clinician: Referring Santiago Graf: Lona Kettle Treating Mickelle Goupil/Extender: Skipper Cliche in Treatment: 0 Primary Learner Assessed: Patient Learning Preferences/Education Level/Primary Language Learning Preference: Explanation Highest Education Level: College or Above Preferred Language: English Cognitive Barrier Language Barrier: No Translator Needed: No Memory Deficit: No Emotional Barrier: No Cultural/Religious Beliefs Affecting Medical Care: No Physical Barrier Impaired Vision: No Impaired Hearing: No Decreased Hand dexterity: No Knowledge/Comprehension Knowledge Level: High Comprehension Level: High Ability to understand written instructions: High Ability to understand verbal instructions: High Motivation Anxiety Level: Calm Cooperation: Cooperative Education Importance: Acknowledges Need Interest in Health Problems: Asks Questions Perception: Coherent Willingness to Engage in Self-Management High Activities: Readiness to Engage in Self-Management High Activities: Electronic Signature(s) Signed: 09/28/2020 3:42:23 PM By: Donnamarie Poag Entered By: Donnamarie Poag on 09/27/2020 10:45:36 Allison Norman ([id number]) -------------------------------------------------------------------------------- Fall Risk Assessment Details Patient Name: Allison Norman. Date of Service: 09/27/2020 10:15 AM Medical Record Number: [id number] Patient Account Number: [id number] Date of Birth/Sex: [birth year] (87 y.o. F) Treating RN: Donnamarie Poag Primary Care Moksh Loomer: Lona Kettle Other  Clinician: Referring Jene Huq: Lona Kettle Treating Zurii Hewes/Extender: Skipper Cliche in Treatment: 0 Fall Risk Assessment Items Have you had 2 or more falls in the last 12 monthso 0 Yes Have you had any fall that resulted in injury in the last 12 monthso 0 Yes FALLS RISK SCREEN History of falling - immediate or within 3 months 25 Yes Secondary diagnosis (Do you have 2 or more medical diagnoseso) 0 No Ambulatory aid None/bed rest/wheelchair/nurse 0 Yes Crutches/cane/walker 15 Yes Furniture 0 No Intravenous therapy Access/Saline/Heparin Lock 0 No Gait/Transferring Normal/ bed rest/ wheelchair 0 No Weak (short steps with or without shuffle,  stooped but able to lift head while walking, may 10 Yes seek support from furniture) Impaired (short steps with shuffle, may have difficulty arising from chair, head down, impaired 0 No balance) Mental Status Oriented to own ability 0 Yes Electronic Signature(s) Signed: 09/28/2020 3:42:23 PM By: Donnamarie Poag Entered By: Donnamarie Poag on 09/27/2020 10:46:20 Mcconathy, [name] ([id number]) -------------------------------------------------------------------------------- Foot Assessment Details Patient Name: Allison Norman. Date of Service: 09/27/2020 10:15 AM Medical Record Number: [id number] Patient Account Number: [id number] Date of Birth/Sex: [birth year] (87 y.o. F) Treating RN: Donnamarie Poag Primary Care Kacen Mellinger: Lona Kettle Other Clinician: Referring Blakely Gluth: Lona Kettle Treating Barnet Benavides/Extender: Skipper Cliche in Treatment: 0 Foot Assessment Items Site Locations + = Sensation present, - = Sensation absent, C = Callus, U = Ulcer R = Redness, W = Warmth, M = Maceration, PU = Pre-ulcerative lesion F = Fissure, S = Swelling, D = Dryness Assessment Right: Left: Other Deformity: No No Prior Foot Ulcer: No No Prior Amputation: No No Charcot Joint: No No Ambulatory Status: Ambulatory With Help Assistance Device: Walker Gait:  Steady Electronic Signature(s) Signed: 09/28/2020 3:42:23 PM By: Donnamarie Poag Entered By: Donnamarie Poag on 09/27/2020 10:46:59 Rekowski, [name] ([id number]) -------------------------------------------------------------------------------- Nutrition Risk Screening Details Patient Name: Allison Norman. Date of Service: 09/27/2020 10:15 AM Medical Record Number: [id number] Patient Account Number: [id number] Date of Birth/Sex: 01-24-1924 (87 y.o. F) Treating RN: Donnamarie Poag Primary Care Jerris Keltz: Lona Kettle Other Clinician: Referring Elliette Seabolt: Lona Kettle Treating Mae Denunzio/Extender: Skipper Cliche in Treatment: 0 Height (in): 65.5 Weight (lbs): 127 Body Mass Index (BMI): 20.8 Nutrition Risk Screening Items Score Screening NUTRITION RISK SCREEN: I have an illness or condition that made me change the kind and/or amount of food I eat 0 No I eat fewer than two meals per day 0 No I eat few fruits and vegetables, or milk products 0 No I have three or more drinks of beer, liquor or wine almost every day 0 No I have tooth or mouth problems that make it hard for me to eat 0 No I don't always have enough money to buy the food I need 0 No I eat alone most of the time 0 No I take three or more different prescribed or over-the-counter drugs a day 1 Yes Without wanting to, I have lost or gained 10 pounds in the last six months 0 No I am not always physically able to shop, cook and/or feed myself 2 Yes Nutrition Protocols Good Risk Protocol Moderate Risk Protocol 0 Provide education on nutrition High Risk Proctocol Risk Level: Moderate Risk Score: 3 Electronic Signature(s) Signed: 09/28/2020 3:42:23 PM By: Donnamarie Poag Entered By: Donnamarie Poag on 09/27/2020 10:46:39

## 2020-09-28 NOTE — Progress Notes (Signed)
TRICA, USERY ([id number]) Visit Report for 09/27/2020 Chief Complaint Document Details Patient Name: Allison Norman, Allison Norman. Date of Service: 09/27/2020 10:15 AM Medical Record Number: [id number] Patient Account Number: [id number] Date of Birth/Sex: February 08, 1924 (87 y.o. F) Treating RN: Donnamarie Poag Primary Care Provider: Lona Kettle Other Clinician: Referring Provider: Lona Kettle Treating Provider/Extender: Skipper Cliche in Treatment: 0 Information Obtained from: Patient Chief Complaint Right knee ulcer/laceration Electronic Signature(s) Signed: 09/27/2020 11:03:19 AM By: Worthy Keeler PA-C Entered By: Worthy Keeler on 09/27/2020 11:03:19 Allison Norman ([id number]) -------------------------------------------------------------------------------- Debridement Details Patient Name: Allison Norman Date of Service: 09/27/2020 10:15 AM Medical Record Number: [id number] Patient Account Number: [id number] Date of Birth/Sex: 1924/10/10 (87 y.o. F) Treating RN: Donnamarie Poag Primary Care Provider: Lona Kettle Other Clinician: Referring Provider: Lona Kettle Treating Provider/Extender: Skipper Cliche in Treatment: 0 Debridement Performed for Wound #1 Right,Anterior Knee Assessment: Performed By: Physician Tommie Sams., PA-C Debridement Type: Chemical/Enzymatic/Mechanical Agent Used: saline and gauze Level of Consciousness (Pre- Awake and Alert procedure): Pre-procedure Verification/Time Out Yes - 11:07 Taken: Start Time: 11:07 Pain Control: Lidocaine Instrument: Other : saline/gauze Bleeding: Moderate Hemostasis Achieved: Pressure Response to Treatment: Procedure was tolerated well Level of Consciousness (Post- Awake and Alert procedure): Post Debridement Measurements of Total Wound Length: (cm) 5.4 Width: (cm) 3 Depth: (cm) 0.3 Volume: (cm) 3.817 Character of Wound/Ulcer Post Debridement: Improved Post Procedure Diagnosis Same as  Pre-procedure Electronic Signature(s) Signed: 09/27/2020 2:21:21 PM By: Worthy Keeler PA-C Signed: 09/28/2020 3:42:23 PM By: Donnamarie Poag Entered By: Donnamarie Poag on 09/27/2020 11:11:29 Maresh, Allison Norman ([id number]) -------------------------------------------------------------------------------- HPI Details Patient Name: Allison Norman. Date of Service: 09/27/2020 10:15 AM Medical Record Number: [id number] Patient Account Number: [id number] Date of Birth/Sex: May 25, 1924 (87 y.o. F) Treating RN: Donnamarie Poag Primary Care Provider: Lona Kettle Other Clinician: Referring Provider: Lona Kettle Treating Provider/Extender: Skipper Cliche in Treatment: 0 History of Present Illness HPI Description: 09/27/2020 upon evaluation today patient appears to be doing somewhat poorly in regard to significant skin tear of her right knee or just below the knee area. This actually occurred on 08/27/2020 and unfortunately being that she is on Eliquis she had a tremendous amount of bleeding. In the ER it was consulted for the surgeon to come down to take a look at this but they advised they did not need to and it just need to be sutured up in the ER. They did their best to get this closed and then subsequently a couple weeks later took the sutures out and applied Steri-Strips. Unfortunately this just has not completely sealed up and continues to bleed some but also has some necrotic tissue noted on the surface of the wound. She does have a history of paroxysmal atrial fibrillation and for that reason is on long-term anticoagulant, Eliquis, for this. Electronic Signature(s) Signed: 09/27/2020 11:50:00 AM By: Worthy Keeler PA-C Entered By: Worthy Keeler on 09/27/2020 11:50:00 Allison Norman ([id number]) -------------------------------------------------------------------------------- Physical Exam Details Patient Name: Allison Norman, Allison Norman. Date of Service: 09/27/2020 10:15 AM Medical Record Number:  [id number] Patient Account Number: [id number] Date of Birth/Sex: July 02, 1924 (87 y.o. F) Treating RN: Donnamarie Poag Primary Care Provider: Lona Kettle Other Clinician: Referring Provider: Lona Kettle Treating Provider/Extender: Skipper Cliche in Treatment: 0 Constitutional sitting or standing blood pressure is within target range for patient.. pulse regular and within target range for patient.[name] respirations regular, non- labored and within target range for patient.[name] temperature within target range for patient.. Well-nourished and well-hydrated in  no acute distress. Eyes conjunctiva clear no eyelid edema noted. pupils equal round and reactive to light and accommodation. Ears, Nose, Mouth, and Throat no gross abnormality of ear auricles or external auditory canals. normal hearing noted during conversation. mucus membranes moist. Respiratory normal breathing without difficulty. Cardiovascular 2+ dorsalis pedis/posterior tibialis pulses. no clubbing, cyanosis, significant edema, <3 sec cap refill. Musculoskeletal unsteady while walking. no significant deformity or arthritic changes, no loss or range of motion, no clubbing. Psychiatric this patient is able to make decisions and demonstrates good insight into disease process. Alert and Oriented x 3. pleasant and cooperative. Notes Upon inspection patient's wound bed actually showed signs of having some necrotic tissue on the surface of the wound unfortunately being on the blood thinner is good to be somewhat difficult for [place] to effectively debride this as efficiently as we need to. Nonetheless I do believe that we can try to get out some of the blood clots and I did in fact proceed with such to clear away as much as I could in that regard. She actually was able to tolerate me doing this and I used a sterile Q-tip to evacuate blood clots underneath the skin. She in the end seem to be doing much better my hope is we can get this to count  reattach without having actually packed this region of undermining and tunneling as this is so significant. We did take some specific measurements to try to evaluate exactly what was going on week by week as far as measurements are concerned. Electronic Signature(s) Signed: 09/27/2020 2:00:34 PM By: Worthy Keeler PA-C Entered By: Worthy Keeler on 09/27/2020 14:00:34 Allison Norman ([id number]) -------------------------------------------------------------------------------- Physician Orders Details Patient Name: Allison Norman Date of Service: 09/27/2020 10:15 AM Medical Record Number: [id number] Patient Account Number: [id number] Date of Birth/Sex: 07-07-1924 (87 y.o. F) Treating RN: Donnamarie Poag Primary Care Provider: Lona Kettle Other Clinician: Referring Provider: Lona Kettle Treating Provider/Extender: Skipper Cliche in Treatment: 0 Verbal / Phone Orders: No Diagnosis Coding ICD-10 Coding Code Description S81.011A Laceration without foreign body, right knee, initial encounter L98.492 Non-pressure chronic ulcer of skin of other sites with fat layer exposed I48.0 Paroxysmal atrial fibrillation Z79.01 Long term (current) use of anticoagulants Follow-up Appointments Wound #1 Right,Anterior Knee o Return Appointment in 1 week. o Nurse Visit as needed - call to schedule if needed [place] o ADMIT to [facility] for wound care. May utilize formulary equivalent dressing for wound treatment orders unless otherwise specified. Home Health Nurse may visit PRN to address patientos wound care needs. o Scheduled days for dressing changes to be completed; exception, patient has scheduled wound care visit that day. - Weekly appt at wound center Bathing/ Shower/ Hygiene o Clean wound with Normal Saline or wound cleanser. o May shower with wound dressing protected with water repellent cover or cast protector. o No tub  bath. Edema Control - Lymphedema / Segmental Compressive Device / Other Wound #1 Right,Anterior Knee o Elevate legs to the level of the heart and pump ankles as often as possible o Elevate leg(s) parallel to the floor when sitting. o DO YOUR BEST to sleep in the bed at night. DO NOT sleep in your recliner. Long hours of sitting in a recliner leads to swelling of the legs and/or potential wounds on your backside. Wound Treatment Wound #1 - Knee Wound Laterality: Right, Anterior Cleanser: Byram Ancillary Kit - 15 Day Supply (DME) (Generic) 2 x Per  Week/30 Days Discharge Instructions: Use supplies as instructed; Kit contains: (15) Saline Bullets; (15) 3x3 Gauze; 15 pr Gloves Cleanser: Normal Saline (DME) (Generic) 2 x Per Week/30 Days Discharge Instructions: Wash your hands with soap and water. Remove old dressing, discard into plastic bag and place into trash. Cleanse the wound with Normal Saline prior to applying a clean dressing using gauze sponges, not tissues or cotton balls. Do not scrub or use excessive force. Pat dry using gauze sponges, not tissue or cotton balls. Primary Dressing: Hydrofera Blue Ready Transfer Foam, 2.5x2.5 (in/in) (DME) (Generic) 2 x Per Week/30 Days Discharge Instructions: DO NOT PUT IN TUNNELS Apply Hydrofera Blue Ready to wound bed as directed Secondary Dressing: Zetuvit Plus Silicone Border Dressing 5x5 (in/in) (DME) (Generic) 2 x Per Week/30 Days Electronic Signature(s) Signed: 09/27/2020 2:21:21 PM By: Worthy Keeler PA-C Signed: 09/28/2020 3:42:23 PM By: Donnamarie Poag Entered By: Donnamarie Poag on 09/27/2020 11:20:27 Allison Norman ([id number]) BUCKLES, Allison Norman ([id number]) -------------------------------------------------------------------------------- Problem List Details Patient Name: Allison Norman, GRUMBINE. Date of Service: 09/27/2020 10:15 AM Medical Record Number: [id number] Patient Account Number: [id number] Date of Birth/Sex: [birth year] (87 y.o.  F) Treating RN: Donnamarie Poag Primary Care Provider: Lona Kettle Other Clinician: Referring Provider: Lona Kettle Treating Provider/Extender: Skipper Cliche in Treatment: 0 Active Problems ICD-10 Encounter Code Description Active Date MDM Diagnosis S81.011A Laceration without foreign body, right knee, initial encounter 09/27/2020 No Yes L98.492 Non-pressure chronic ulcer of skin of other sites with fat layer exposed 09/27/2020 No Yes I48.0 Paroxysmal atrial fibrillation 09/27/2020 No Yes Z79.01 Long term (current) use of anticoagulants 09/27/2020 No Yes Inactive Problems Resolved Problems Electronic Signature(s) Signed: 09/27/2020 11:02:30 AM By: Worthy Keeler PA-C Entered By: Worthy Keeler on 09/27/2020 11:02:29 Allison Norman ([id number]) -------------------------------------------------------------------------------- Progress Note Details Patient Name: Allison Norman. Date of Service: 09/27/2020 10:15 AM Medical Record Number: [id number] Patient Account Number: [id number] Date of Birth/Sex: 01-18-1924 (87 y.o. F) Treating RN: Donnamarie Poag Primary Care Provider: Lona Kettle Other Clinician: Referring Provider: Lona Kettle Treating Provider/Extender: Skipper Cliche in Treatment: 0 Subjective Chief Complaint Information obtained from Patient Right knee ulcer/laceration History of Present Illness (HPI) 09/27/2020 upon evaluation today patient appears to be doing somewhat poorly in regard to significant skin tear of her right knee or just below the knee area. This actually occurred on 08/27/2020 and unfortunately being that she is on Eliquis she had a tremendous amount of bleeding. In the ER it was consulted for the surgeon to come down to take a look at this but they advised they did not need to and it just need to be sutured up in the ER. They did their best to get this closed and then subsequently a couple weeks later took the sutures out and applied  Steri-Strips. Unfortunately this just has not completely sealed up and continues to bleed some but also has some necrotic tissue noted on the surface of the wound. She does have a history of paroxysmal atrial fibrillation and for that reason is on long-term anticoagulant, Eliquis, for this. Patient History Information obtained from Patient. Allergies codeine (Severity: Moderate, Reaction: itch) Social History Former smoker - ended on 01/06/1970, Marital Status - Widowed, Alcohol Use - Never, Drug Use - No History, Caffeine Use - Never. Medical History Hematologic/Lymphatic Patient has history of Anemia Cardiovascular Patient has history of Arrhythmia - a fib, Hypertension Musculoskeletal Patient has history of Osteoarthritis - hx R TKA Oncologic Denies history of Received Chemotherapy, Received Radiation Review of Systems (  ROS) Constitutional Symptoms (General Health) Denies complaints or symptoms of Fatigue, Fever, Chills, Marked Weight Change. Eyes Complains or has symptoms of Vision Changes, gets injections for R macular degeneration Ear/Nose/Mouth/Throat Denies complaints or symptoms of Difficult clearing ears, Sinusitis. Respiratory Denies complaints or symptoms of Chronic or frequent coughs, Shortness of Breath, PRN oxygen at HS Cardiovascular Complains or has symptoms of LE edema - hx. Gastrointestinal Denies complaints or symptoms of Frequent diarrhea, Nausea, Vomiting. Endocrine Denies complaints or symptoms of Hepatitis, Thyroid disease, Polydypsia (Excessive Thirst). Genitourinary AKI at hospital and has indwelling foley cath draining yellow urine Immunological Denies complaints or symptoms of Hives, Itching. Integumentary (Skin) hx skin cancers removed/follows with dermatogy Neurologic Denies complaints or symptoms of Numbness/parasthesias, Focal/Weakness. Oncologic hx L mastectomy took Tamoxifen 5 yrs post cancer Psychiatric Denies complaints or symptoms of  Anxiety, Claustrophobia. Allison Norman, Allison Norman ([id number]) Objective Constitutional sitting or standing blood pressure is within target range for patient.. pulse regular and within target range for patient.[name] respirations regular, non- labored and within target range for patient.[name] temperature within target range for patient.. Well-nourished and well-hydrated in no acute distress. Vitals Time Taken: 10:30 AM, Height: 65.5 in, Source: Stated, Weight: 127 lbs, Source: Stated, BMI: 20.8, Temperature: 98.1 F, Pulse: 102 bpm, Respiratory Rate: 18 breaths/min, Blood Pressure: 122/75 mmHg. Eyes conjunctiva clear no eyelid edema noted. pupils equal round and reactive to light and accommodation. Ears, Nose, Mouth, and Throat no gross abnormality of ear auricles or external auditory canals. normal hearing noted during conversation. mucus membranes moist. Respiratory normal breathing without difficulty. Cardiovascular 2+ dorsalis pedis/posterior tibialis pulses. no clubbing, cyanosis, significant edema, Musculoskeletal unsteady while walking. no significant deformity or arthritic changes, no loss or range of motion, no clubbing. Psychiatric this patient is able to make decisions and demonstrates good insight into disease process. Alert and Oriented x 3. pleasant and cooperative. General Notes: Upon inspection patient's wound bed actually showed signs of having some necrotic tissue on the surface of the wound unfortunately being on the blood thinner is good to be somewhat difficult for [place] to effectively debride this as efficiently as we need to. Nonetheless I do believe that we can try to get out some of the blood clots and I did in fact proceed with such to clear away as much as I could in that regard. She actually was able to tolerate me doing this and I used a sterile Q-tip to evacuate blood clots underneath the skin. She in the end seem to be doing much better my hope is we can get this to count reattach  without having actually packed this region of undermining and tunneling as this is so significant. We did take some specific measurements to try to evaluate exactly what was going on week by week as far as measurements are concerned. Integumentary (Hair, Skin) Wound #1 status is Open. Original cause of wound was Laceration. The date acquired was: 08/27/2020. The wound is located on the Right,Anterior Knee. The wound measures 5.4cm length x 3cm width x 0.3cm depth; 12.723cm^2 area and 3.817cm^3 volume. There is Fat Layer (Subcutaneous Tissue) exposed. There is no undermining noted, however, there is tunneling at 12:00 with a maximum distance of 2.6cm. There is additional tunneling and at 3:00 with a maximum distance of 3.5cm. There is a medium amount of serosanguineous drainage noted. There is small (1-33%) red, pink, hyper - granulation within the wound bed. There is a large (67-100%) amount of necrotic tissue within the wound bed including Eschar and Adherent  Slough. Assessment Active Problems ICD-10 Laceration without foreign body, right knee, initial encounter Non-pressure chronic ulcer of skin of other sites with fat layer exposed Paroxysmal atrial fibrillation Long term (current) use of anticoagulants Procedures Wound #1 Pre-procedure diagnosis of Wound #1 is a Trauma, Other located on the Right,Anterior Knee . There was a Chemical/Enzymatic/Mechanical debridement performed by Tommie Sams., PA-C. With the following instrument(s): saline/gauze after achieving pain control using Lidocaine. Other agent used was saline and gauze. A time out was conducted at 11:07, prior to the start of the procedure. A Moderate amount of bleeding was controlled with Pressure. The procedure was tolerated well. Post Debridement Measurements: 5.4cm length x 3cm width x 0.3cm depth; 3.817cm^3 volume. Character of Wound/Ulcer Post Debridement is improved. Post procedure Diagnosis Wound #1: Same as  Pre-Procedure Allison Norman, Allison Norman ([id number]) Plan Follow-up Appointments: Wound #1 Right,Anterior Knee: Return Appointment in 1 week. Nurse Visit as needed - call to schedule if needed Home Health: [place] ADMIT to [facility] for wound care. May utilize formulary equivalent dressing for wound treatment orders unless otherwise specified. Home Health Nurse may visit PRN to address patient s wound care needs. Scheduled days for dressing changes to be completed; exception, patient has scheduled wound care visit that day. - Weekly appt at wound center Bathing/ Shower/ Hygiene: Clean wound with Normal Saline or wound cleanser. May shower with wound dressing protected with water repellent cover or cast protector. No tub bath. Edema Control - Lymphedema / Segmental Compressive Device / Other: Wound #1 Right,Anterior Knee: Elevate legs to the level of the heart and pump ankles as often as possible Elevate leg(s) parallel to the floor when sitting. DO YOUR BEST to sleep in the bed at night. DO NOT sleep in your recliner. Long hours of sitting in a recliner leads to swelling of the legs and/or potential wounds on your backside. WOUND #1: - Knee Wound Laterality: Right, Anterior Cleanser: Byram Ancillary Kit - 15 Day Supply (DME) (Generic) 2 x Per Week/30 Days Discharge Instructions: Use supplies as instructed; Kit contains: (15) Saline Bullets; (15) 3x3 Gauze; 15 pr Gloves Cleanser: Normal Saline (DME) (Generic) 2 x Per Week/30 Days Discharge Instructions: Wash your hands with soap and water. Remove old dressing, discard into plastic bag and place into trash. Cleanse the wound with Normal Saline prior to applying a clean dressing using gauze sponges, not tissues or cotton balls. Do not scrub or use excessive force. Pat dry using gauze sponges, not tissue or cotton balls. Primary Dressing: Hydrofera Blue Ready Transfer Foam, 2.5x2.5 (in/in) (DME) (Generic) 2 x Per Week/30  Days Discharge Instructions: DO NOT PUT IN TUNNELS Apply Hydrofera Blue Ready to wound bed as directed Secondary Dressing: Zetuvit Plus Silicone Border Dressing 5x5 (in/in) (DME) (Generic) 2 x Per Week/30 Days 1. Would recommend currently that we go ahead and have the patient go ahead and start on using Hydrofera Blue this will just be externally I do not want this to be due to the fact that I think the packing is good and should keep this open much more than what I want to see. I really want this tissue where I evacuated the hematoma to really resolve and not just collect up underneath. 2. I am also can recommend that we use a Tubigrip over top to secure in place after using a border foam dressing in order to help with some compression hopefully this will prevent it from going back up as far as any bleeding  and blood clots are concerned. 3. She will also continue to monitor for any signs of infection the right now I do not see anything that appears to be infected to me. We will see patient back for reevaluation in 1 week here in the clinic. If anything worsens or changes patient will contact our office for additional recommendations. Electronic Signature(s) Signed: 09/27/2020 2:01:54 PM By: Worthy Keeler PA-C Entered By: Worthy Keeler on 09/27/2020 14:01:54 Allison Norman ([id number]) -------------------------------------------------------------------------------- ROS/PFSH Details Patient Name: Allison Norman Date of Service: 09/27/2020 10:15 AM Medical Record Number: [id number] Patient Account Number: [id number] Date of Birth/Sex: March 08, 1924 (87 y.o. F) Treating RN: Donnamarie Poag Primary Care Provider: Lona Kettle Other Clinician: Referring Provider: Lona Kettle Treating Provider/Extender: Skipper Cliche in Treatment: 0 Information Obtained From Patient Constitutional Symptoms (General Health) Complaints and Symptoms: Negative for: Fatigue; Fever; Chills; Marked Weight  Change Eyes Complaints and Symptoms: Positive for: Vision Changes Review of System Notes: gets injections for R macular degeneration Ear/Nose/Mouth/Throat Complaints and Symptoms: Negative for: Difficult clearing ears; Sinusitis Respiratory Complaints and Symptoms: Negative for: Chronic or frequent coughs; Shortness of Breath Review of System Notes: PRN oxygen at HS Cardiovascular Complaints and Symptoms: Positive for: LE edema - hx Medical History: Positive for: Arrhythmia - a fib; Hypertension Gastrointestinal Complaints and Symptoms: Negative for: Frequent diarrhea; Nausea; Vomiting Endocrine Complaints and Symptoms: Negative for: Hepatitis; Thyroid disease; Polydypsia (Excessive Thirst) Immunological Complaints and Symptoms: Negative for: Hives; Itching Neurologic Complaints and Symptoms: Negative for: Numbness/parasthesias; Focal/Weakness Psychiatric Complaints and Symptoms: Negative for: Anxiety; Allison Norman, Allison Norman ([id number]) Hematologic/Lymphatic Medical History: Positive for: Anemia Genitourinary Complaints and Symptoms: Review of System Notes: AKI at hospital and has indwelling foley cath draining yellow urine Integumentary (Skin) Complaints and Symptoms: Review of System Notes: hx skin cancers removed/follows with dermatogy Musculoskeletal Medical History: Positive for: Osteoarthritis - hx R TKA Oncologic Complaints and Symptoms: Review of System Notes: hx L mastectomy took Tamoxifen 5 yrs post cancer Medical History: Negative for: Received Chemotherapy; Received Radiation Immunizations Pneumococcal Vaccine: Received Pneumococcal Vaccination: Yes Received Pneumococcal Vaccination On or After 60th Birthday: Yes Implantable Devices Yes Family and Social History Former smoker - ended on 01/06/1970; Marital Status - Widowed; Alcohol Use: Never; Drug Use: No History; Caffeine Use: Never; Financial Concerns: No; Food, Clothing or  Shelter Needs: No; Support System Lacking: No; Transportation Concerns: No Electronic Signature(s) Signed: 09/27/2020 2:21:21 PM By: Worthy Keeler PA-C Signed: 09/28/2020 3:42:23 PM By: Donnamarie Poag Entered By: Donnamarie Poag on 09/27/2020 11:12:02 Allison Norman ([id number]) -------------------------------------------------------------------------------- SuperBill Details Patient Name: Allison Norman. Date of Service: 09/27/2020 Medical Record Number: [id number] Patient Account Number: [id number] Date of Birth/Sex: 12-17-1924 (87 y.o. F) Treating RN: Donnamarie Poag Primary Care Provider: Lona Kettle Other Clinician: Referring Provider: Lona Kettle Treating Provider/Extender: Skipper Cliche in Treatment: 0 Diagnosis Coding ICD-10 Codes Code Description S81.011A Laceration without foreign body, right knee, initial encounter L98.492 Non-pressure chronic ulcer of skin of other sites with fat layer exposed I48.0 Paroxysmal atrial fibrillation Z79.01 Long term (current) use of anticoagulants Facility Procedures CPT4 Code: [id number] Description: [address]13 - WOUND CARE VISIT-LEV 3 EST PT Modifier: Quantity: 1 Physician Procedures CPT4 Code: 7353299 Description: 99204 - WC PHYS LEVEL 4 - NEW PT Modifier: Quantity: 1 CPT4 Code: Description: ICD-10 Diagnosis Description S81.011A Laceration without foreign body, right knee, initial encounter 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

## 2020-09-28 NOTE — Progress Notes (Signed)
Allison, Norman ([id number]) Visit Report for 09/27/2020 Allergy List Details Patient Name: Allison Norman, Allison Norman. Date of Service: 09/27/2020 10:15 AM Medical Record Number: [id number] Patient Account Number: [id number] Date of Birth/Sex: 23-Aug-1924 (87 y.o. F) Treating RN: Donnamarie Poag Primary Care Madilyn Cephas: Lona Kettle Other Clinician: Referring Martrell Eguia: Lona Kettle Treating Merrin Mcvicker/Extender: Jeri Cos Weeks in Treatment: 0 Allergies Active Allergies codeine Reaction: itch Severity: Moderate Allergy Notes Electronic Signature(s) Signed: 09/28/2020 3:42:23 PM By: Donnamarie Poag Entered By: Donnamarie Poag on 09/27/2020 10:44:29 Varricchio, [name] ([id number]) -------------------------------------------------------------------------------- Arrival Information Details Patient Name: Allison Norman Date of Service: 09/27/2020 10:15 AM Medical Record Number: [id number] Patient Account Number: [id number] Date of Birth/Sex: 1924/04/06 (87 y.o. F) Treating RN: Donnamarie Poag Primary Care Macyn Shropshire: Lona Kettle Other Clinician: Referring Darby Shadwick: Lona Kettle Treating Deivi Huckins/Extender: Skipper Cliche in Treatment: 0 Visit Information Patient Arrived: Wheel Chair Arrival Time: 10:30 Accompanied By: grandaughter Transfer Assistance: None Patient Identification Verified: Yes Secondary Verification Process Completed: Yes Patient Has Alerts: Yes Patient Alerts: Patient on Blood Thinner Eliquis Electronic Signature(s) Signed: 09/28/2020 3:42:23 PM By: Donnamarie Poag Entered By: Donnamarie Poag on 09/27/2020 10:32:41 Robling, [name] ([id number]) -------------------------------------------------------------------------------- Clinic Level of Care Assessment Details Patient Name: Allison Norman Date of Service: 09/27/2020 10:15 AM Medical Record Number: [id number] Patient Account Number: [id number] Date of Birth/Sex: 1924/04/17 (87 y.o. F) Treating RN: Donnamarie Poag Primary Care Michelle Wnek: Lona Kettle Other Clinician: Referring Palmira Stickle: Lona Kettle Treating Hisashi Amadon/Extender: Skipper Cliche in Treatment: 0 Clinic Level of Care Assessment Items TOOL 1 Quantity Score _0  - Use when EandM and Procedure is performed on INITIAL visit 0 ASSESSMENTS - Nursing Assessment / Reassessment X - General Physical Exam (combine w/ comprehensive assessment (listed just below) when performed on new 1 20 pt. evals) _1  - 0 Comprehensive Assessment (HX, ROS, Risk Assessments, Wounds Hx, etc.) ASSESSMENTS - Wound and Skin Assessment / Reassessment _2  - Dermatologic / Skin Assessment (not related to wound area) 0 ASSESSMENTS - Ostomy and/or Continence Assessment and Care _3  - Incontinence Assessment and Management 0 _4  - 0 Ostomy Care Assessment and Management (repouching, etc.) PROCESS - Coordination of Care X - Simple Patient / Family Education for ongoing care 1 15 _5  - 0 Complex (extensive) Patient / Family Education for ongoing care X- 1 10 Staff obtains Programmer, systems, Records, Test Results / Process Orders X- 1 10 Staff telephones HHA, Nursing Homes / Clarify orders / etc _6  - 0 Routine Transfer to another Facility (non-emergent condition) _7  - 0 Routine Hospital Admission (non-emergent condition) X- 1 15 New Admissions / Biomedical engineer / Ordering NPWT, Apligraf, etc. _8  - 0 Emergency Hospital Admission (emergent condition) PROCESS - Special Needs _9  - Pediatric / Minor Patient Management 0 _10  - 0 Isolation Patient Management _11  - 0 Hearing / Language / Visual special needs _12  - 0 Assessment of Community assistance (transportation, D/C planning, etc.) _13  - 0 Additional assistance / Altered mentation _14  - 0 Support Surface(s) Assessment (bed, cushion, seat, etc.) INTERVENTIONS - Miscellaneous _15  - External ear exam 0 _16  - 0 Patient Transfer (multiple staff / Civil Service fast streamer / Similar devices) _17  - 0 Simple Staple / Suture removal (25 or less) _18  - 0 Complex Staple  / Suture removal (26 or more) _19  - 0 Hypo/Hyperglycemic Management (do not check if billed separately) X- 1 15 Ankle / Brachial Index (ABI) - do not check if billed separately Has the patient been seen at the hospital within the last three years: Yes Total Score: 85 Level  Of Care: New/Established - Level 3 CALLIOPE, DELANGEL ([id number]) Electronic Signature(s) Signed: 09/28/2020 3:42:23 PM By: Donnamarie Poag Entered By: Donnamarie Poag on 09/27/2020 11:21:03 Allison Norman ([id number]) -------------------------------------------------------------------------------- Encounter Discharge Information Details Patient Name: Allison, Norman. Date of Service: 09/27/2020 10:15 AM Medical Record Number: [id number] Patient Account Number: [id number] Date of Birth/Sex: 1925-01-01 (87 y.o. F) Treating RN: Donnamarie Poag Primary Care Amyrah Pinkhasov: Lona Kettle Other Clinician: Referring Ammie Warrick: Lona Kettle Treating Petar Mucci/Extender: Skipper Cliche in Treatment: 0 Encounter Discharge Information Items Post Procedure Vitals Discharge Condition: Stable Temperature (F): 98.1 Ambulatory Status: Wheelchair Pulse (bpm): 102 Discharge Destination: Home Respiratory Rate (breaths/min): 18 Transportation: Private Auto Blood Pressure (mmHg): 122/75 Accompanied By: Lenward Chancellor Schedule Follow-up Appointment: Yes Clinical Summary of Care: Electronic Signature(s) Signed: 09/28/2020 3:42:23 PM By: Donnamarie Poag Entered By: Donnamarie Poag on 09/27/2020 11:22:12 [place], [name] ([id number]) -------------------------------------------------------------------------------- Lower Extremity Assessment Details Patient Name: Allison Norman. Date of Service: 09/27/2020 10:15 AM Medical Record Number: [id number] Patient Account Number: [id number] Date of Birth/Sex: 11/11/1924 (87 y.o. F) Treating RN: Donnamarie Poag Primary Care Royale Lennartz: Lona Kettle Other Clinician: Referring Charnae Lill: Lona Kettle Treating  Anda Sobotta/Extender: Skipper Cliche in Treatment: 0 Edema Assessment Assessed: [Left: No] [Right: Yes] Edema: [Left: N] [Right: o] Calf Left: Right: Point of Measurement: 35 cm From Medial Instep 32.5 cm Ankle Left: Right: Point of Measurement: 10 cm From Medial Instep 21 cm Knee To Floor Left: Right: From Medial Instep 44 cm Vascular Assessment Pulses: Dorsalis Pedis Palpable: [Right:Yes] Blood Pressure: Brachial: [Right:90] Ankle: [Right:Dorsalis Pedis: 98 1.09] Electronic Signature(s) Signed: 09/28/2020 3:42:23 PM By: Donnamarie Poag Entered By: Donnamarie Poag on 09/27/2020 10:57:23 [place], [name] ([id number]) -------------------------------------------------------------------------------- Multi Wound Chart Details Patient Name: Allison Norman. Date of Service: 09/27/2020 10:15 AM Medical Record Number: [id number] Patient Account Number: [id number] Date of Birth/Sex: Aug 22, 1924 (86 y.o. F) Treating RN: Donnamarie Poag Primary Care Yuleimy Kretz: Lona Kettle Other Clinician: Referring Gesenia Bantz: Lona Kettle Treating Dionicia Cerritos/Extender: Skipper Cliche in Treatment: 0 Vital Signs Height(in): 65.5 Pulse(bpm): 102 Weight(lbs): 127 Blood Pressure(mmHg): 122/75 Body Mass Index(BMI): 21 Temperature(F): 98.1 Respiratory Rate(breaths/min): 18 Photos: [N/A:N/A] Wound Location: Right, Anterior Knee N/A N/A Wounding Event: Laceration N/A N/A Primary Etiology: Trauma, Other N/A N/A Date Acquired: 08/27/2020 N/A N/A Weeks of Treatment: 0 N/A N/A Wound Status: Open N/A N/A Measurements L x W x D (cm) 5.4x3x0.3 N/A N/A Area (cm) : 12.723 N/A N/A Volume (cm) : 3.817 N/A N/A Starting Position 1 (o'clock): 7 Ending Position 1 (o'clock): 7 Maximum Distance 1 (cm): 2.4 Starting Position 2 (o'clock): 10 Ending Position 2 (o'clock): 10 Maximum Distance 2 (cm): 2.4 Undermining: Yes N/A N/A Classification: Full Thickness Without Exposed N/A N/A Support Structures Exudate Amount:  Medium N/A N/A Exudate Type: Serosanguineous N/A N/A Exudate Color: red, brown N/A N/A Granulation Amount: Small (1-33%) N/A N/A Necrotic Amount: Large (67-100%) N/A N/A Necrotic Tissue: Eschar, Adherent Slough N/A N/A Exposed Structures: Fat Layer (Subcutaneous Tissue): N/A N/A Yes Fascia: No Tendon: No Muscle: No Joint: No Bone: No Treatment Notes Electronic Signature(s) Signed: 09/28/2020 3:42:23 PM By: Donnamarie Poag Entered By: Donnamarie Poag on 09/27/2020 11:07:06 Allison Norman ([id number]) -------------------------------------------------------------------------------- Multi-Disciplinary Care Plan Details Patient Name: Allison Norman Date of Service: 09/27/2020 10:15 AM Medical Record Number: [id number] Patient Account Number: [id number] Date of Birth/Sex: 02/05/1924 (87 y.o. F) Treating RN: Donnamarie Poag Primary Care Bardia Wangerin: Lona Kettle Other Clinician: Referring Dmauri Rosenow: Lona Kettle Treating Nakai Pollio/Extender: Skipper Cliche in Treatment: 0 Active Inactive Orientation to the Wound  Care Program Nursing Diagnoses: Knowledge deficit related to the wound healing center program Goals: Patient/caregiver will verbalize understanding of the [place] Date Initiated: 09/27/2020 Target Resolution Date: 10/05/2020 Goal Status: Active Interventions: Provide education on orientation to the wound center Notes: Wound/Skin Impairment Nursing Diagnoses: Impaired tissue integrity Knowledge deficit related to smoking impact on wound healing Knowledge deficit related to ulceration/compromised skin integrity Goals: Ulcer/skin breakdown will have a volume reduction of 30% by week 4 Date Initiated: 09/27/2020 Target Resolution Date: 10/27/2020 Goal Status: Active Ulcer/skin breakdown will have a volume reduction of 50% by week 8 Date Initiated: 09/27/2020 Target Resolution Date: 11/27/2020 Goal Status: Active Ulcer/skin breakdown will have a volume  reduction of 80% by week 12 Date Initiated: 09/27/2020 Target Resolution Date: 12/27/2020 Goal Status: Active Ulcer/skin breakdown will heal within 14 weeks Date Initiated: 09/27/2020 Target Resolution Date: 01/07/2021 Goal Status: Active Interventions: Assess patient/caregiver ability to obtain necessary supplies Assess patient/caregiver ability to perform ulcer/skin care regimen upon admission and as needed Assess ulceration(s) every visit Notes: Electronic Signature(s) Signed: 09/28/2020 3:42:23 PM By: Donnamarie Poag Entered By: Donnamarie Poag on 09/27/2020 11:06:36 [place], [name] ([id number]) -------------------------------------------------------------------------------- Pain Assessment Details Patient Name: Allison Norman Date of Service: 09/27/2020 10:15 AM Medical Record Number: [id number] Patient Account Number: [id number] Date of Birth/Sex: [birth year] (87 y.o. F) Treating RN: Donnamarie Poag Primary Care Orange Hill[place]oss: Lona Kettle Other Clinician: Referring Lev Cervone: Lona Kettle Treating Laqueisha Catalina/Extender: Skipper Cliche in Treatment: 0 Active Problems Location of Pain Severity and Description of Pain Patient Has Paino No Site Locations Rate the pain. Current Pain Level: 0 Pain Management and Medication Current Pain Management: Electronic Signature(s) Signed: 09/28/2020 3:42:23 PM By: Donnamarie Poag Entered By: Donnamarie Poag on 09/27/2020 10:34:39 Allison Norman ([id number]) -------------------------------------------------------------------------------- Patient/Caregiver Education Details Patient Name: Allison Norman Date of Service: 09/27/2020 10:15 AM Medical Record Number: [id number] Patient Account Number: [id number] Date of Birth/Gender: 05/07/1924 (87 y.o. F) Treating RN: Donnamarie Poag Primary Care Physician: Lona Kettle Other Clinician: Referring Physician: Lona Kettle Treating Physician/Extender: Skipper Cliche in Treatment: 0 Education  Assessment Education Provided To: Patient and Caregiver Education Topics Provided Basic Hygiene: Welcome To [facility]: Wound/Skin Impairment: Electronic Signature(s) Signed: 09/28/2020 3:42:23 PM By: Donnamarie Poag Entered By: Donnamarie Poag on 09/27/2020 11:21:22 RIVERS, GASSMANN ([id number]) -------------------------------------------------------------------------------- Wound Assessment Details Patient Name: ALIENA, GHRIST C. Date of Service: 09/27/2020 10:15 AM Medical Record Number: [id number] Patient Account Number: [id number] Date of Birth/Sex: 17-Nov-1924 (87 y.o. F) Treating RN: Donnamarie Poag Primary Care Jeniece Hannis: Lona Kettle Other Clinician: Referring Shanavia Makela: Lona Kettle Treating Xavi Tomasik/Extender: Skipper Cliche in Treatment: 0 Wound Status Wound Number: 1 Primary Etiology: Trauma, Other Wound Location: Right, Anterior Knee Wound Status: Open Wounding Event: Laceration Comorbid History: Anemia, Arrhythmia, Hypertension, Osteoarthritis Date Acquired: 08/27/2020 Weeks Of Treatment: 0 Clustered Wound: No Photos Wound Measurements Length: (cm) 5.4 Width: (cm) 3 Depth: (cm) 0.3 Area: (cm) 12.723 Volume: (cm) 3.817 % Reduction in Area: 0% % Reduction in Volume: 0% Tunneling: Yes Location 1 Position (o'clock): 12 Maximum Distance: (cm) 2.6 Location 2 Position (o'clock): 3 Maximum Distance: (cm) 3.5 Undermining: No Wound Description Classification: Full Thickness Without Exposed Support Structu Exudate Amount: Medium Exudate Type: Serosanguineous Exudate Color: red, brown res Foul Odor After Cleansing: No Slough/Fibrino Yes Wound Bed Granulation Amount: Small (1-33%) Exposed Structure Granulation Quality: Red, Pink, Hyper-granulation Fascia Exposed: No Necrotic Amount: Large (67-100%) Fat Layer (Subcutaneous Tissue) Exposed: Yes Necrotic Quality: Eschar, Adherent Slough Tendon Exposed: No Muscle Exposed: No Joint  Exposed: No Bone Exposed:  No Treatment Notes Wound #1 (Knee) Wound Laterality: Right, Anterior Crocker, Chiyoko C. ([id number]) Cleanser [place] Ancillary Kit - 15 Day Supply Discharge Instruction: Use supplies as instructed; Kit contains: (15) Saline Bullets; (15) 3x3 Gauze; 15 pr Gloves Normal Saline Discharge Instruction: Wash your hands with soap and water. Remove old dressing, discard into plastic bag and place into trash. Cleanse the wound with Normal Saline prior to applying a clean dressing using gauze sponges, not tissues or cotton balls. Do not scrub or use excessive force. Pat dry using gauze sponges, not tissue or cotton balls. Peri-Wound Care Topical Primary Dressing Hydrofera Blue Ready Transfer Foam, 2.5x2.5 (in/in) Discharge Instruction: DO NOT PUT IN TUNNELS Apply Hydrofera Blue Ready to wound bed as directed Secondary Dressing Zetuvit Plus Silicone Border Dressing 5x5 (in/in) Secured With Compression Wrap Compression Stockings Add-Ons Electronic Signature(s) Signed: 09/28/2020 3:42:23 PM By: Donnamarie Poag Entered By: Donnamarie Poag on 09/27/2020 12:02:15 Doria, [name] ([id number]) -------------------------------------------------------------------------------- Vitals Details Patient Name: Allison Norman. Date of Service: 09/27/2020 10:15 AM Medical Record Number: [id number] Patient Account Number: [id number] Date of Birth/Sex: [birth year] (87 y.o. F) Treating RN: Donnamarie Poag Primary Care Abbygale Lapid: Lona Kettle Other Clinician: Referring Agustus Mane: Lona Kettle Treating Nevaen Tredway/Extender: Skipper Cliche in Treatment: 0 Vital Signs Time Taken: 10:30 Temperature (F): 98.1 Height (in): 65.5 Pulse (bpm): 102 Source: Stated Respiratory Rate (breaths/min): 18 Weight (lbs): 127 Blood Pressure (mmHg): 122/75 Source: Stated Reference Range: 80 - 120 mg / dl Body Mass Index (BMI): 20.8 Electronic Signature(s) Signed: 09/28/2020 3:42:23 PM By: Donnamarie Poag Entered ByDonnamarie Poag on  09/27/2020 10:36:58

## 2020-10-01 DIAGNOSIS — S81001D Unspecified open wound, right knee, subsequent encounter: Secondary | ICD-10-CM | POA: Diagnosis not present

## 2020-10-01 DIAGNOSIS — N184 Chronic kidney disease, stage 4 (severe): Secondary | ICD-10-CM | POA: Diagnosis not present

## 2020-10-01 DIAGNOSIS — I129 Hypertensive chronic kidney disease with stage 1 through stage 4 chronic kidney disease, or unspecified chronic kidney disease: Secondary | ICD-10-CM | POA: Diagnosis not present

## 2020-10-01 DIAGNOSIS — H3530 Unspecified macular degeneration: Secondary | ICD-10-CM | POA: Diagnosis not present

## 2020-10-01 DIAGNOSIS — I4819 Other persistent atrial fibrillation: Secondary | ICD-10-CM | POA: Diagnosis not present

## 2020-10-01 DIAGNOSIS — D62 Acute posthemorrhagic anemia: Secondary | ICD-10-CM | POA: Diagnosis not present

## 2020-10-02 DIAGNOSIS — D62 Acute posthemorrhagic anemia: Secondary | ICD-10-CM | POA: Diagnosis not present

## 2020-10-02 DIAGNOSIS — N184 Chronic kidney disease, stage 4 (severe): Secondary | ICD-10-CM | POA: Diagnosis not present

## 2020-10-02 DIAGNOSIS — S81001D Unspecified open wound, right knee, subsequent encounter: Secondary | ICD-10-CM | POA: Diagnosis not present

## 2020-10-02 DIAGNOSIS — H3530 Unspecified macular degeneration: Secondary | ICD-10-CM | POA: Diagnosis not present

## 2020-10-02 DIAGNOSIS — R338 Other retention of urine: Secondary | ICD-10-CM | POA: Diagnosis not present

## 2020-10-02 DIAGNOSIS — I4819 Other persistent atrial fibrillation: Secondary | ICD-10-CM | POA: Diagnosis not present

## 2020-10-02 DIAGNOSIS — I129 Hypertensive chronic kidney disease with stage 1 through stage 4 chronic kidney disease, or unspecified chronic kidney disease: Secondary | ICD-10-CM | POA: Diagnosis not present

## 2020-10-04 ENCOUNTER — Encounter: Payer: Medicare Other | Admitting: Physician Assistant

## 2020-10-04 ENCOUNTER — Other Ambulatory Visit: Payer: Self-pay

## 2020-10-04 DIAGNOSIS — I480 Paroxysmal atrial fibrillation: Secondary | ICD-10-CM | POA: Diagnosis not present

## 2020-10-04 DIAGNOSIS — L98492 Non-pressure chronic ulcer of skin of other sites with fat layer exposed: Secondary | ICD-10-CM | POA: Diagnosis not present

## 2020-10-04 DIAGNOSIS — L97812 Non-pressure chronic ulcer of other part of right lower leg with fat layer exposed: Secondary | ICD-10-CM | POA: Diagnosis not present

## 2020-10-04 DIAGNOSIS — Z7901 Long term (current) use of anticoagulants: Secondary | ICD-10-CM | POA: Diagnosis not present

## 2020-10-04 NOTE — Progress Notes (Addendum)
Allison Norman, Allison Norman ([id number]) Visit Report for 10/04/2020 Chief Complaint Document Details Patient Name: Allison Norman, Allison Norman. Date of Service: 10/04/2020 2:15 PM Medical Record Number: [id number] Patient Account Number: [id number] Date of Birth/Sex: 03-30-1924 (87 y.o. F) Treating RN: Allison Norman Primary Care Provider: Lona Norman Other Clinician: Referring Provider: Lona Norman Treating Provider/Extender: Allison Norman in Treatment: 1 Information Obtained from: Patient Chief Complaint Right knee ulcer/laceration Electronic Signature(s) Signed: 10/04/2020 2:14:36 PM By: Allison Keeler PA-C Entered By: Allison Norman on 10/04/2020 14:14:36 Norman, Allison [place] ([id number]) -------------------------------------------------------------------------------- HPI Details Patient Name: Allison Norman Date of Service: 10/04/2020 2:15 PM Medical Record Number: [id number] Patient Account Number: [id number] Date of Birth/Sex: 12-09-1924 (87 y.o. F) Treating RN: Allison Norman Primary Care Provider: Lona Norman Other Clinician: Referring Provider: Lona Norman Treating Provider/Extender: Allison Norman in Treatment: 1 History of Present Illness HPI Description: 09/27/2020 upon evaluation today patient appears to be doing somewhat poorly in regard to significant skin tear of her right knee or just below the knee area. This actually occurred on 08/27/2020 and unfortunately being that she is on Eliquis she had a tremendous amount of bleeding. In the ER it was consulted for the surgeon to come down to take a look at this but they advised they did not need to and it just need to be sutured up in the ER. They did their best to get this closed and then subsequently a couple weeks later took the sutures out and applied Steri-Strips. Unfortunately this just has not completely sealed up and continues to bleed some but also has some necrotic tissue noted on the surface of the wound. She does have a  history of paroxysmal atrial fibrillation and for that reason is on long-term anticoagulant, Eliquis, for this. 10/04/2020 upon evaluation today patient appears to be doing better in regard to her leg ulcer today. Fortunately there does not appear to be any evidence of active infection which is great news and overall I am extremely pleased with where we stand. I do not see any evidence whatsoever of infection and I think she is making great progress. Electronic Signature(s) Signed: 10/04/2020 5:05:43 PM By: Allison Keeler PA-C Previous Signature: 10/04/2020 2:41:04 PM Version By: Allison Keeler PA-C Entered By: Allison Norman on 10/04/2020 17:05:43 Allison Norman ([id number]) -------------------------------------------------------------------------------- Physical Exam Details Patient Name: Allison Norman, Allison Norman. Date of Service: 10/04/2020 2:15 PM Medical Record Number: [id number] Patient Account Number: [id number] Date of Birth/Sex: [birth year] (87 y.o. F) Treating RN: Allison Norman Primary Care Provider: Lona Norman Other Clinician: Referring Provider: Lona Norman Treating Provider/Extender: Allison Norman in Treatment: 1 Constitutional Well-nourished and well-hydrated in no acute distress. Respiratory normal breathing without difficulty. Psychiatric this patient is able to make decisions and demonstrates good insight into disease process. Alert and Oriented x 3. pleasant and cooperative. Notes Upon inspection patient's wound bed showed signs of good granulation epithelization at this point. Her granddaughter is doing an awesome job taking care of this and I think that she is very lucky to have her help and with the dressing changes. Overall I think that the [facility] is doing a great job no sharp debridement necessary today. Electronic Signature(s) Signed: 10/04/2020 5:06:01 PM By: Allison Keeler PA-C Entered By: Allison Norman on 10/04/2020 17:06:01 Allison, Norman  ([id number]) -------------------------------------------------------------------------------- Physician Orders Details Patient Name: Allison Norman Date of Service: 10/04/2020 2:15 PM Medical Record Number: [id number] Patient Account Number: [id number] Date of Birth/Sex: June 18, 1924 (87 y.o.  F) Treating RN: Allison Norman Primary Care Provider: Lona Norman Other Clinician: Referring Provider: Lona Norman Treating Provider/Extender: Allison Norman in Treatment: 1 Verbal / Phone Orders: No Diagnosis Coding ICD-10 Coding Code Description S81.011A Laceration without foreign body, right knee, initial encounter L98.492 Non-pressure chronic ulcer of skin of other sites with fat layer exposed I48.0 Paroxysmal atrial fibrillation Z79.01 Long term (current) use of anticoagulants Follow-up Appointments Wound #1 Right,Anterior Knee o Return Appointment in 1 week. o Nurse Visit as needed - call to schedule if needed [place] [phone] o ADMIT to [facility] for wound care. May utilize formulary equivalent dressing for wound treatment orders unless otherwise specified. Home Health Nurse may visit PRN to address patientos wound care needs. o Scheduled days for dressing changes to be completed; exception, patient has scheduled wound care visit that day. - Weekly appt at wound center Bathing/ Shower/ Hygiene o Clean wound with Normal Saline or wound cleanser. o May shower with wound dressing protected with water repellent cover or cast protector. o No tub bath. Edema Control - Lymphedema / Segmental Compressive Device / Other Wound #1 Right,Anterior Knee o Elevate legs to the level of the heart and pump ankles as often as possible o Elevate leg(s) parallel to the floor when sitting. o DO YOUR BEST to sleep in the bed at night. DO NOT sleep in your recliner. Long hours of sitting in a recliner leads to swelling of the legs  and/or potential wounds on your backside. Wound Treatment Wound #1 - Knee Wound Laterality: Right, Anterior Cleanser: Byram Ancillary Kit - 15 Day Supply (DME) (Generic) 3 x Per Week/30 Days Discharge Instructions: Use supplies as instructed; Kit contains: (15) Saline Bullets; (15) 3x3 Gauze; 15 pr Gloves Cleanser: Normal Saline (Generic) 3 x Per Week/30 Days Discharge Instructions: Wash your hands with soap and water. Remove old dressing, discard into plastic bag and place into trash. Cleanse the wound with Normal Saline prior to applying a clean dressing using gauze sponges, not tissues or cotton balls. Do not scrub or use excessive force. Pat dry using gauze sponges, not tissue or cotton balls. Primary Dressing: Hydrofera Blue Ready Transfer Foam, 2.5x2.5 (in/in) (DME) (Generic) 3 x Per Week/30 Days Discharge Instructions: DO NOT PUT IN TUNNELS Apply Hydrofera Blue Ready to wound bed as directed Secondary Dressing: Zetuvit Plus Silicone Border Dressing 5x5 (in/in) (DME) (Generic) 3 x Per Week/30 Days Electronic Signature(s) Signed: 10/04/2020 5:08:20 PM By: Allison Coria RN Signed: 10/04/2020 5:23:54 PM By: Allison Keeler PA-C Entered By: Allison Norman on 10/04/2020 16:35:51 Allison Norman, Allison Norman ([id number]) Allison Norman, Allison Norman ([id number]) -------------------------------------------------------------------------------- Problem List Details Patient Name: Allison Norman, Allison Norman. Date of Service: 10/04/2020 2:15 PM Medical Record Number: [id number] Patient Account Number: [id number] Date of Birth/Sex: 1925-01-06 (87 y.o. F) Treating RN: Allison Norman Primary Care Provider: Lona Norman Other Clinician: Referring Provider: Lona Norman Treating Provider/Extender: Allison Norman in Treatment: 1 Active Problems ICD-10 Encounter Code Description Active Date MDM Diagnosis S81.011A Laceration without foreign body, right knee, initial encounter 09/27/2020 No Yes L98.492 Non-pressure chronic ulcer of  skin of other sites with fat layer exposed 09/27/2020 No Yes I48.0 Paroxysmal atrial fibrillation 09/27/2020 No Yes Z79.01 Long term (current) use of anticoagulants 09/27/2020 No Yes Inactive Problems Resolved Problems Electronic Signature(s) Signed: 10/04/2020 2:14:31 PM By: Allison Keeler PA-C Entered By: Allison Norman on 10/04/2020 14:14:30 Allison Norman ([id number]) -------------------------------------------------------------------------------- Progress Note Details Patient Name: Allison Norman. Date of  Service: 10/04/2020 2:15 PM Medical Record Number: [id number] Patient Account Number: [id number] Date of Birth/Sex: 1925/01/01 (87 y.o. F) Treating RN: Allison Norman Primary Care Provider: Lona Norman Other Clinician: Referring Provider: Lona Norman Treating Provider/Extender: Allison Norman in Treatment: 1 Subjective Chief Complaint Information obtained from Patient Right knee ulcer/laceration History of Present Illness (HPI) 09/27/2020 upon evaluation today patient appears to be doing somewhat poorly in regard to significant skin tear of her right knee or just below the knee area. This actually occurred on 08/27/2020 and unfortunately being that she is on Eliquis she had a tremendous amount of bleeding. In the ER it was consulted for the surgeon to come down to take a look at this but they advised they did not need to and it just need to be sutured up in the ER. They did their best to get this closed and then subsequently a couple weeks later took the sutures out and applied Steri-Strips. Unfortunately this just has not completely sealed up and continues to bleed some but also has some necrotic tissue noted on the surface of the wound. She does have a history of paroxysmal atrial fibrillation and for that reason is on long-term anticoagulant, Eliquis, for this. 10/04/2020 upon evaluation today patient appears to be doing better in regard to her leg ulcer today. Fortunately  there does not appear to be any evidence of active infection which is great news and overall I am extremely pleased with where we stand. I do not see any evidence whatsoever of infection and I think she is making great progress. Objective Constitutional Well-nourished and well-hydrated in no acute distress. Vitals Time Taken: 2:18 PM, Height: 65.5 in, Weight: 127 lbs, BMI: 20.8, Temperature: 97.9 F, Pulse: 81 bpm, Respiratory Rate: 18 breaths/min, Blood Pressure: 100/65 mmHg. Respiratory normal breathing without difficulty. Psychiatric this patient is able to make decisions and demonstrates good insight into disease process. Alert and Oriented x 3. pleasant and cooperative. General Notes: Upon inspection patient's wound bed showed signs of good granulation epithelization at this point. Her granddaughter is doing an awesome job taking care of this and I think that she is very lucky to have her help and with the dressing changes. Overall I think that the [facility] is doing a great job no sharp debridement necessary today. Integumentary (Hair, Skin) Wound #1 status is Open. Original cause of wound was Laceration. The date acquired was: 08/27/2020. The wound has been in treatment 1 weeks. The wound is located on the Right,Anterior Knee. The wound measures 5cm length x 3.4cm width x 0.3cm depth; 13.352cm^2 area and 4.006cm^3 volume. There is Fat Layer (Subcutaneous Tissue) exposed. There is no tunneling or undermining noted. There is a medium amount of serosanguineous drainage noted. There is small (1-33%) red, pink, hyper - granulation within the wound bed. There is a large (67-100%) amount of necrotic tissue within the wound bed including Eschar and Adherent Slough. Assessment Active Problems ICD-10 Laceration without foreign body, right knee, initial encounter Non-pressure chronic ulcer of skin of other sites with fat layer exposed Paroxysmal atrial fibrillation Wanless, Creta C.  ([id number]) Long term (current) use of anticoagulants Plan Follow-up Appointments: Wound #1 Right,Anterior Knee: Return Appointment in 1 week. Nurse Visit as needed - call to schedule if needed Home Health: [place]: Ileene Hutchinson - [phone] ADMIT to [facility] for wound care. May utilize formulary equivalent dressing for wound treatment orders unless otherwise specified. Home Health Nurse may visit PRN to address patient s wound care  needs. Scheduled days for dressing changes to be completed; exception, patient has scheduled wound care visit that day. - Weekly appt at wound center Bathing/ Shower/ Hygiene: Clean wound with Normal Saline or wound cleanser. May shower with wound dressing protected with water repellent cover or cast protector. No tub bath. Edema Control - Lymphedema / Segmental Compressive Device / Other: Wound #1 Right,Anterior Knee: Elevate legs to the level of the heart and pump ankles as often as possible Elevate leg(s) parallel to the floor when sitting. DO YOUR BEST to sleep in the bed at night. DO NOT sleep in your recliner. Long hours of sitting in a recliner leads to swelling of the legs and/or potential wounds on your backside. WOUND #1: - Knee Wound Laterality: Right, Anterior Cleanser: Byram Ancillary Kit - 15 Day Supply (DME) (Generic) 3 x Per Week/30 Days Discharge Instructions: Use supplies as instructed; Kit contains: (15) Saline Bullets; (15) 3x3 Gauze; 15 pr Gloves Cleanser: Normal Saline (Generic) 3 x Per Week/30 Days Discharge Instructions: Wash your hands with soap and water. Remove old dressing, discard into plastic bag and place into trash. Cleanse the wound with Normal Saline prior to applying a clean dressing using gauze sponges, not tissues or cotton balls. Do not scrub or use excessive force. Pat dry using gauze sponges, not tissue or cotton balls. Primary Dressing: Hydrofera Blue Ready Transfer Foam, 2.5x2.5 (in/in) (DME)  (Generic) 3 x Per Week/30 Days Discharge Instructions: DO NOT PUT IN TUNNELS Apply Hydrofera Blue Ready to wound bed as directed Secondary Dressing: Zetuvit Plus Silicone Border Dressing 5x5 (in/in) (DME) (Generic) 3 x Per Week/30 Days 1. Would recommend currently that we going continue with wound care measures as before and the patient is in agreement with plan. This includes the use of the [facility] which I think is good to be the optimal thing. She is done extremely well with that over the past week and I think we should continue as such. 2. Also can recommend that we have the patient continue with the border foam dressing to cover I think that is doing a great job. We will see patient back for reevaluation in 1 week here in the clinic. If anything worsens or changes patient will contact our office for additional recommendations. Electronic Signature(s) Signed: 10/04/2020 5:06:30 PM By: Allison Keeler PA-C Entered By: Allison Norman on 10/04/2020 17:06:29 Allison Norman ([id number]) -------------------------------------------------------------------------------- SuperBill Details Patient Name: Allison Norman Date of Service: 10/04/2020 Medical Record Number: [id number] Patient Account Number: [id number] Date of Birth/Sex: 10/10/1924 (87 y.o. F) Treating RN: Allison Norman Primary Care Provider: Lona Norman Other Clinician: Referring Provider: Lona Norman Treating Provider/Extender: Allison Norman in Treatment: 1 Diagnosis Coding ICD-10 Codes Code Description B70.488Q Laceration without foreign body, right knee, initial encounter L98.492 Non-pressure chronic ulcer of skin of other sites with fat layer exposed I48.0 Paroxysmal atrial fibrillation Z79.01 Long term (current) use of anticoagulants Facility Procedures CPT4 Code: [id number] Description: 99213 - WOUND CARE VISIT-LEV 3 EST PT Modifier: Quantity: 1 Physician Procedures CPT4 Code: 8882800 Description: 34917 -  WC PHYS LEVEL 3 - EST PT Modifier: Quantity: 1 CPT4 Code: Description: ICD-10 Diagnosis Description H15.056P Laceration without foreign body, right knee, initial encounter L98.492 Non-pressure chronic ulcer of skin of other sites with fat layer expos I48.0 Paroxysmal atrial fibrillation Z79.01 Long term  (current) use of anticoagulants Modifier: ed Quantity: Electronic Signature(s) Signed: 10/04/2020 5:07:07 PM By: Allison Keeler PA-C Entered By: Allison Norman on 10/04/2020 17:07:07

## 2020-10-04 NOTE — Progress Notes (Addendum)
SARAN, LAVIOLETTE ([id number]) Visit Report for 10/04/2020 Arrival Information Details Patient Name: Allison Norman, Allison Norman. Date of Service: 10/04/2020 2:15 PM Medical Record Number: [id number] Patient Account Number: [id number] Date of Birth/Sex: [birth year] (87 y.o. F) Treating RN: Carlene Coria Primary Care Brielyn Bosak: Lona Kettle Other Clinician: Referring Erin Obando: Lona Kettle Treating Ethelmae Ringel/Extender: Skipper Cliche in Treatment: 1 Visit Information History Since Last Visit All ordered tests and consults were completed: No Patient Arrived: Wheel Chair Added or deleted any medications: No Arrival Time: 14:12 Any new allergies or adverse reactions: No Accompanied By: self Had a fall or experienced change in No Transfer Assistance: None activities of daily living that may affect Patient Identification Verified: Yes risk of falls: Secondary Verification Process Completed: Yes Signs or symptoms of abuse/neglect since last visito No Patient Has Alerts: Yes Hospitalized since last visit: No Patient Alerts: Patient on Blood Thinner Implantable device outside of the clinic excluding No Eliquis cellular tissue based products placed in the center since last visit: Has Dressing in Place as Prescribed: Yes Pain Present Now: No Electronic Signature(s) Signed: 10/04/2020 5:08:20 PM By: Carlene Coria RN Entered By: Carlene Coria on 10/04/2020 14:18:26 Allison Norman, Allison Norman ([id number]) -------------------------------------------------------------------------------- Clinic Level of Care Assessment Details Patient Name: Allison Norman Date of Service: 10/04/2020 2:15 PM Medical Record Number: [id number] Patient Account Number: [id number] Date of Birth/Sex: 1924/04/22 (87 y.o. F) Treating RN: Carlene Coria Primary Care Isabellah Sobocinski: Lona Kettle Other Clinician: Referring Persais Ethridge: Lona Kettle Treating [place] Agostinelli/Extender: Skipper Cliche in Treatment: 1 Clinic Level of Care Assessment  Items TOOL 4 Quantity Score X - Use when only an EandM is performed on FOLLOW-UP visit 1 0 ASSESSMENTS - Nursing Assessment / Reassessment X - Reassessment of Co-morbidities (includes updates in patient status) 1 10 X- 1 5 Reassessment of Adherence to Treatment Plan ASSESSMENTS - Wound and Skin Assessment / Reassessment X - Simple Wound Assessment / Reassessment - one wound 1 5 '[]'  - 0 Complex Wound Assessment / Reassessment - multiple wounds '[]'  - 0 Dermatologic / Skin Assessment (not related to wound area) ASSESSMENTS - Focused Assessment '[]'  - Circumferential Edema Measurements - multi extremities 0 '[]'  - 0 Nutritional Assessment / Counseling / Intervention '[]'  - 0 Lower Extremity Assessment (monofilament, tuning fork, pulses) '[]'  - 0 Peripheral Arterial Disease Assessment (using hand held doppler) ASSESSMENTS - Ostomy and/or Continence Assessment and Care '[]'  - Incontinence Assessment and Management 0 '[]'  - 0 Ostomy Care Assessment and Management (repouching, etc.) PROCESS - Coordination of Care X - Simple Patient / Family Education for ongoing care 1 15 '[]'  - 0 Complex (extensive) Patient / Family Education for ongoing care X- 1 10 Staff obtains Programmer, systems, Records, Test Results / Process Orders '[]'  - 0 Staff telephones HHA, Nursing Homes / Clarify orders / etc '[]'  - 0 Routine Transfer to another Facility (non-emergent condition) '[]'  - 0 Routine Hospital Admission (non-emergent condition) '[]'  - 0 New Admissions / Biomedical engineer / Ordering NPWT, Apligraf, etc. '[]'  - 0 Emergency Hospital Admission (emergent condition) X- 1 10 Simple Discharge Coordination '[]'  - 0 Complex (extensive) Discharge Coordination PROCESS - Special Needs '[]'  - Pediatric / Minor Patient Management 0 '[]'  - 0 Isolation Patient Management '[]'  - 0 Hearing / Language / Visual special needs '[]'  - 0 Assessment of Community assistance (transportation, D/C planning, etc.) '[]'  - 0 Additional assistance /  Altered mentation '[]'  - 0 Support Surface(s) Assessment (bed, cushion, seat, etc.) INTERVENTIONS - Wound Cleansing / Measurement Allison Norman, Allison C. ([id number]) X- 1 5 Simple  Wound Cleansing - one wound '[]'  - 0 Complex Wound Cleansing - multiple wounds X- 1 5 Wound Imaging (photographs - any number of wounds) '[]'  - 0 Wound Tracing (instead of photographs) X- 1 5 Simple Wound Measurement - one wound '[]'  - 0 Complex Wound Measurement - multiple wounds INTERVENTIONS - Wound Dressings X - Small Wound Dressing one or multiple wounds 1 10 '[]'  - 0 Medium Wound Dressing one or multiple wounds '[]'  - 0 Large Wound Dressing one or multiple wounds X- 1 5 Application of Medications - topical '[]'  - 0 Application of Medications - injection INTERVENTIONS - Miscellaneous '[]'  - External ear exam 0 '[]'  - 0 Specimen Collection (cultures, biopsies, blood, body fluids, etc.) '[]'  - 0 Specimen(s) / Culture(s) sent or taken to Lab for analysis '[]'  - 0 Patient Transfer (multiple staff / Civil Service fast streamer / Similar devices) '[]'  - 0 Simple Staple / Suture removal (25 or less) '[]'  - 0 Complex Staple / Suture removal (26 or more) '[]'  - 0 Hypo / Hyperglycemic Management (close monitor of Blood Glucose) '[]'  - 0 Ankle / Brachial Index (ABI) - do not check if billed separately X- 1 5 Vital Signs Has the patient been seen at the hospital within the last three years: Yes Total Score: 90 Level Of Care: New/Established - Level 3 Electronic Signature(s) Signed: 10/04/2020 5:08:20 PM By: Carlene Coria RN Entered By: Carlene Coria on 10/04/2020 15:10:31 Allison Norman ([id number]) -------------------------------------------------------------------------------- Encounter Discharge Information Details Patient Name: Allison Norman. Date of Service: 10/04/2020 2:15 PM Medical Record Number: [id number] Patient Account Number: [id number] Date of Birth/Sex: 21-Sep-1924 (87 y.o. F) Treating RN: Carlene Coria Primary Care Nikcole Eischeid:  Lona Kettle Other Clinician: Referring Ciarah Peace: Lona Kettle Treating Chrisoula Zegarra/Extender: Skipper Cliche in Treatment: 1 Encounter Discharge Information Items Discharge Condition: Stable Ambulatory Status: Wheelchair Discharge Destination: Home Transportation: Private Auto Accompanied By: self Schedule Follow-up Appointment: Yes Clinical Summary of Care: Patient Declined Electronic Signature(s) Signed: 10/04/2020 5:08:20 PM By: Carlene Coria RN Entered By: Carlene Coria on 10/04/2020 15:11:33 Allison Norman ([id number]) -------------------------------------------------------------------------------- Lower Extremity Assessment Details Patient Name: Allison Norman, Allison Norman. Date of Service: 10/04/2020 2:15 PM Medical Record Number: [id number] Patient Account Number: [id number] Date of Birth/Sex: 20-Jan-1924 (87 y.o. F) Treating RN: Carlene Coria Primary Care Tannie Koskela: Lona Kettle Other Clinician: Referring Luka Stohr: Lona Kettle Treating Dahlton Hinde/Extender: Jeri Cos Weeks in Treatment: 1 Edema Assessment Assessed: [Left: Yes] [Right: No] Edema: [Left: Ye] [Right: s] Calf Left: Right: Point of Measurement: 35 cm From Medial Instep 35 cm Ankle Left: Right: Point of Measurement: 10 cm From Medial Instep 21 cm Knee To Floor Left: Right: From Medial Instep 44 cm Vascular Assessment Pulses: Dorsalis Pedis Palpable: [Right:Yes] Electronic Signature(s) Signed: 10/04/2020 5:08:20 PM By: Carlene Coria RN Entered By: Carlene Coria on 10/04/2020 14:25:58 Allison Norman, Allison Norman ([id number]) -------------------------------------------------------------------------------- Multi Wound Chart Details Patient Name: Allison Norman. Date of Service: 10/04/2020 2:15 PM Medical Record Number: [id number] Patient Account Number: [id number] Date of Birth/Sex: 12/14/1924 (87 y.o. F) Treating RN: Carlene Coria Primary Care Dreyson Mishkin: Lona Kettle Other Clinician: Referring Armonte Tortorella: Lona Kettle Treating Glendy Barsanti/Extender: Skipper Cliche in Treatment: 1 Vital Signs Height(in): 65.5 Pulse(bpm): 81 Weight(lbs): 127 Blood Pressure(mmHg): 100/65 Body Mass Index(BMI): 21 Temperature(F): 97.9 Respiratory Rate(breaths/min): 18 Photos: [N/A:N/A] Wound Location: Right, Anterior Knee N/A N/A Wounding Event: Laceration N/A N/A Primary Etiology: Trauma, Other N/A N/A Comorbid History: Anemia, Arrhythmia, Hypertension, N/A N/A Osteoarthritis Date Acquired: 08/27/2020 N/A N/A Weeks of Treatment: 1 N/A N/A Wound Status: Open N/A  N/A Measurements L x W x D (cm) 5x3.4x0.3 N/A N/A Area (cm) : 13.352 N/A N/A Volume (cm) : 4.006 N/A N/A % Reduction in Area: -4.90% N/A N/A % Reduction in Volume: -5.00% N/A N/A Classification: Full Thickness Without Exposed N/A N/A Support Structures Exudate Amount: Medium N/A N/A Exudate Type: Serosanguineous N/A N/A Exudate Color: red, brown N/A N/A Granulation Amount: Small (1-33%) N/A N/A Granulation Quality: Red, Pink, Hyper-granulation N/A N/A Necrotic Amount: Large (67-100%) N/A N/A Necrotic Tissue: Eschar, Adherent Slough N/A N/A Exposed Structures: Fat Layer (Subcutaneous Tissue): N/A N/A Yes Fascia: No Tendon: No Muscle: No Joint: No Bone: No Epithelialization: None N/A N/A Treatment Notes Electronic Signature(s) Signed: 10/04/2020 5:08:20 PM By: Carlene Coria RN Entered By: Carlene Coria on 10/04/2020 15:07:47 Allison Norman ([id number]) -------------------------------------------------------------------------------- [place] Details Patient Name: Allison Norman Date of Service: 10/04/2020 2:15 PM Medical Record Number: [id number] Patient Account Number: [id number] Date of Birth/Sex: Jun 07, 1924 (87 y.o. F) Treating RN: Carlene Coria Primary Care Tinleigh Whitmire: Lona Kettle Other Clinician: Referring Kiannah Grunow: Lona Kettle Treating Johnathon Mittal/Extender: Skipper Cliche in Treatment: 1 Active  Inactive Orientation to the Wound Care Program Nursing Diagnoses: Knowledge deficit related to the wound healing center program Goals: Patient/caregiver will verbalize understanding of the [facility] Date Initiated: 09/27/2020 Target Resolution Date: 10/05/2020 Goal Status: Active Interventions: Provide education on orientation to the wound center Notes: Wound/Skin Impairment Nursing Diagnoses: Impaired tissue integrity Knowledge deficit related to smoking impact on wound healing Knowledge deficit related to ulceration/compromised skin integrity Goals: Ulcer/skin breakdown will have a volume reduction of 30% by week 4 Date Initiated: 09/27/2020 Target Resolution Date: 10/27/2020 Goal Status: Active Ulcer/skin breakdown will have a volume reduction of 50% by week 8 Date Initiated: 09/27/2020 Target Resolution Date: 11/27/2020 Goal Status: Active Ulcer/skin breakdown will have a volume reduction of 80% by week 12 Date Initiated: 09/27/2020 Target Resolution Date: 12/27/2020 Goal Status: Active Ulcer/skin breakdown will heal within 14 weeks Date Initiated: 09/27/2020 Target Resolution Date: 01/07/2021 Goal Status: Active Interventions: Assess patient/caregiver ability to obtain necessary supplies Assess patient/caregiver ability to perform ulcer/skin care regimen upon admission and as needed Assess ulceration(s) every visit Notes: Electronic Signature(s) Signed: 10/04/2020 5:08:20 PM By: Carlene Coria RN Entered By: Carlene Coria on 10/04/2020 15:07:39 Allison Norman, Allison Norman ([id number]) -------------------------------------------------------------------------------- Pain Assessment Details Patient Name: Allison Norman Date of Service: 10/04/2020 2:15 PM Medical Record Number: [id number] Patient Account Number: [id number] Date of Birth/Sex: 1925/01/06 (87 y.o. F) Treating RN: Carlene Coria Primary Care Tnia Anglada: Lona Kettle Other Clinician: Referring Daliah Chaudoin: Lona Kettle Treating Ashanti Littles/Extender: Skipper Cliche in Treatment: 1 Active Problems Location of Pain Severity and Description of Pain Patient Has Paino No Site Locations Pain Management and Medication Current Pain Management: Electronic Signature(s) Signed: 10/04/2020 5:08:20 PM By: Carlene Coria RN Entered By: Carlene Coria on 10/04/2020 14:19:15 Allison Norman ([id number]) -------------------------------------------------------------------------------- Patient/Caregiver Education Details Patient Name: Allison Norman Date of Service: 10/04/2020 2:15 PM Medical Record Number: [id number] Patient Account Number: [id number] Date of Birth/Gender: May 12, 1924 (87 y.o. F) Treating RN: Carlene Coria Primary Care Physician: Lona Kettle Other Clinician: Referring Physician: Lona Kettle Treating Physician/Extender: Skipper Cliche in Treatment: 1 Education Assessment Education Provided To: Patient Education Topics Provided Welcome To [facility] Methods: Explain/Verbal Responses: State content correctly Electronic Signature(s) Signed: 10/04/2020 5:08:20 PM By: Carlene Coria RN Entered By: Carlene Coria on 10/04/2020 15:10:47 Allison Norman ([id number]) -------------------------------------------------------------------------------- Wound Assessment Details Patient Name: Allison Norman, Allison Norman. Date of Service: 10/04/2020 2:15  PM Medical Record Number: [id number] Patient Account Number: [id number] Date of Birth/Sex: 06-07-1924 (87 y.o. F) Treating RN: Carlene Coria Primary Care Mariabella Nilsen: Lona Kettle Other Clinician: Referring Bhavana Kady: Lona Kettle Treating Ghazi Rumpf/Extender: Jeri Cos Weeks in Treatment: 1 Wound Status Wound Number: 1 Primary Etiology: Trauma, Other Wound Location: Right, Anterior Knee Wound Status: Open Wounding Event: Laceration Comorbid History: Anemia, Arrhythmia, Hypertension, Osteoarthritis Date Acquired: 08/27/2020 Weeks Of Treatment:  1 Clustered Wound: No Photos Wound Measurements Length: (cm) 5 Width: (cm) 3.4 Depth: (cm) 0.3 Area: (cm) 13.352 Volume: (cm) 4.006 % Reduction in Area: -4.9% % Reduction in Volume: -5% Epithelialization: None Tunneling: No Undermining: No Wound Description Classification: Full Thickness Without Exposed Support Structures Exudate Amount: Medium Exudate Type: Serosanguineous Exudate Color: red, brown Foul Odor After Cleansing: No Slough/Fibrino Yes Wound Bed Granulation Amount: Small (1-33%) Exposed Structure Granulation Quality: Red, Pink, Hyper-granulation Fascia Exposed: No Necrotic Amount: Large (67-100%) Fat Layer (Subcutaneous Tissue) Exposed: Yes Necrotic Quality: Eschar, Adherent Slough Tendon Exposed: No Muscle Exposed: No Joint Exposed: No Bone Exposed: No Treatment Notes Wound #1 (Knee) Wound Laterality: Right, Anterior Cleanser [place] Ancillary Kit - 15 Day Supply Discharge Instruction: Use supplies as instructed; Kit contains: (15) Saline Bullets; (15) 3x3 Gauze; 15 pr Gloves Normal Saline Discharge Instruction: Wash your hands with soap and water. Remove old dressing, discard into plastic bag and place into trash. Cleanse the wound with Normal Saline prior to applying a clean dressing using gauze sponges, not tissues or cotton balls. Do not Allison Norman, Allison C. ([id number]) scrub or use excessive force. Pat dry using gauze sponges, not tissue or cotton balls. Peri-Wound Care Topical Primary Dressing Hydrofera Blue Ready Transfer Foam, 2.5x2.5 (in/in) Discharge Instruction: DO NOT PUT IN TUNNELS Apply Hydrofera Blue Ready to wound bed as directed Secondary Dressing Zetuvit Plus Silicone Border Dressing 5x5 (in/in) Secured With Compression Wrap Compression Stockings Add-Ons Electronic Signature(s) Signed: 10/04/2020 5:08:20 PM By: Carlene Coria RN Entered By: Carlene Coria on 10/04/2020 14:24:21 Allison Norman  ([id number]) -------------------------------------------------------------------------------- Vitals Details Patient Name: Allison Norman. Date of Service: 10/04/2020 2:15 PM Medical Record Number: [id number] Patient Account Number: [id number] Date of Birth/Sex: December 06, 1924 (87 y.o. F) Treating RN: Carlene Coria Primary Care Kiondre Grenz: Lona Kettle Other Clinician: Referring Carlous Olivares: Lona Kettle Treating Adaysha Dubinsky/Extender: Skipper Cliche in Treatment: 1 Vital Signs Time Taken: 14:18 Temperature (F): 97.9 Height (in): 65.5 Pulse (bpm): 81 Weight (lbs): 127 Respiratory Rate (breaths/min): 18 Body Mass Index (BMI): 20.8 Blood Pressure (mmHg): 100/65 Reference Range: 80 - 120 mg / dl Electronic Signature(s) Signed: 10/04/2020 5:08:20 PM By: Carlene Coria RN Entered By: Carlene Coria on 10/04/2020 14:18:44

## 2020-10-09 DIAGNOSIS — M179 Osteoarthritis of knee, unspecified: Secondary | ICD-10-CM | POA: Diagnosis not present

## 2020-10-09 DIAGNOSIS — E781 Pure hyperglyceridemia: Secondary | ICD-10-CM | POA: Diagnosis not present

## 2020-10-09 DIAGNOSIS — D62 Acute posthemorrhagic anemia: Secondary | ICD-10-CM | POA: Diagnosis not present

## 2020-10-09 DIAGNOSIS — K219 Gastro-esophageal reflux disease without esophagitis: Secondary | ICD-10-CM | POA: Diagnosis not present

## 2020-10-09 DIAGNOSIS — I10 Essential (primary) hypertension: Secondary | ICD-10-CM | POA: Diagnosis not present

## 2020-10-09 DIAGNOSIS — S81001D Unspecified open wound, right knee, subsequent encounter: Secondary | ICD-10-CM | POA: Diagnosis not present

## 2020-10-09 DIAGNOSIS — I4819 Other persistent atrial fibrillation: Secondary | ICD-10-CM | POA: Diagnosis not present

## 2020-10-09 DIAGNOSIS — N184 Chronic kidney disease, stage 4 (severe): Secondary | ICD-10-CM | POA: Diagnosis not present

## 2020-10-09 DIAGNOSIS — G4700 Insomnia, unspecified: Secondary | ICD-10-CM | POA: Diagnosis not present

## 2020-10-09 DIAGNOSIS — H3530 Unspecified macular degeneration: Secondary | ICD-10-CM | POA: Diagnosis not present

## 2020-10-09 DIAGNOSIS — I4891 Unspecified atrial fibrillation: Secondary | ICD-10-CM | POA: Diagnosis not present

## 2020-10-09 DIAGNOSIS — I129 Hypertensive chronic kidney disease with stage 1 through stage 4 chronic kidney disease, or unspecified chronic kidney disease: Secondary | ICD-10-CM | POA: Diagnosis not present

## 2020-10-09 DIAGNOSIS — D649 Anemia, unspecified: Secondary | ICD-10-CM | POA: Diagnosis not present

## 2020-10-11 ENCOUNTER — Encounter: Payer: Medicare Other | Attending: Physician Assistant | Admitting: Physician Assistant

## 2020-10-11 ENCOUNTER — Other Ambulatory Visit: Payer: Self-pay

## 2020-10-11 DIAGNOSIS — L98492 Non-pressure chronic ulcer of skin of other sites with fat layer exposed: Secondary | ICD-10-CM | POA: Insufficient documentation

## 2020-10-11 DIAGNOSIS — I480 Paroxysmal atrial fibrillation: Secondary | ICD-10-CM | POA: Insufficient documentation

## 2020-10-11 DIAGNOSIS — Z7901 Long term (current) use of anticoagulants: Secondary | ICD-10-CM | POA: Diagnosis not present

## 2020-10-11 DIAGNOSIS — L97812 Non-pressure chronic ulcer of other part of right lower leg with fat layer exposed: Secondary | ICD-10-CM | POA: Diagnosis not present

## 2020-10-11 NOTE — Progress Notes (Signed)
ARLETTA, LUMADUE ([id number]) Visit Report for 10/11/2020 Arrival Information Details Patient Name: Allison Norman, Allison Norman. Date of Service: 10/11/2020 2:30 PM Medical Record Number: [id number] Patient Account Number: [id number] Date of Birth/Sex: 1924/01/27 (87 y.o. F) Treating RN: Donnamarie Poag Primary Care Gerardo Territo: Lona Kettle Other Clinician: Referring Sabine Tenenbaum: Lona Kettle Treating Mija Effertz/Extender: Skipper Cliche in Treatment: 2 Visit Information History Since Last Visit Added or deleted any medications: No Patient Arrived: Gilford Rile Had a fall or experienced change in No Arrival Time: 14:16 activities of daily living that may affect Accompanied By: caregiver risk of falls: Transfer Assistance: None Hospitalized since last visit: No Patient Identification Verified: Yes Has Dressing in Place as Prescribed: Yes Secondary Verification Process Completed: Yes Pain Present Now: No Patient Has Alerts: Yes Patient Alerts: Patient on Blood Thinner Eliquis Electronic Signature(s) Signed: 10/11/2020 4:14:40 PM By: Donnamarie Poag Entered By: Donnamarie Poag on 10/11/2020 14:25:12 Corado, [name] ([id number]) -------------------------------------------------------------------------------- Clinic Level of Care Assessment Details Patient Name: Allison Norman Date of Service: 10/11/2020 2:30 PM Medical Record Number: [id number] Patient Account Number: [id number] Date of Birth/Sex: 1924-03-29 (87 y.o. F) Treating RN: Donnamarie Poag Primary Care Jahmez Bily: Lona Kettle Other Clinician: Referring Jkwon Treptow: Lona Kettle Treating Mashell Sieben/Extender: Skipper Cliche in Treatment: 2 Clinic Level of Care Assessment Items TOOL 1 Quantity Score []  - Use when EandM and Procedure is performed on INITIAL visit 0 ASSESSMENTS - Nursing Assessment / Reassessment []  - General Physical Exam (combine w/ comprehensive assessment (listed just below) when performed on new 0 pt. evals) []  - 0 Comprehensive  Assessment (HX, ROS, Risk Assessments, Wounds Hx, etc.) ASSESSMENTS - Wound and Skin Assessment / Reassessment []  - Dermatologic / Skin Assessment (not related to wound area) 0 ASSESSMENTS - Ostomy and/or Continence Assessment and Care []  - Incontinence Assessment and Management 0 []  - 0 Ostomy Care Assessment and Management (repouching, etc.) PROCESS - Coordination of Care []  - Simple Patient / Family Education for ongoing care 0 []  - 0 Complex (extensive) Patient / Family Education for ongoing care []  - 0 Staff obtains Programmer, systems, Records, Test Results / Process Orders []  - 0 Staff telephones HHA, Nursing Homes / Clarify orders / etc []  - 0 Routine Transfer to another Facility (non-emergent condition) []  - 0 Routine Hospital Admission (non-emergent condition) []  - 0 New Admissions / Biomedical engineer / Ordering NPWT, Apligraf, etc. []  - 0 Emergency Hospital Admission (emergent condition) PROCESS - Special Needs []  - Pediatric / Minor Patient Management 0 []  - 0 Isolation Patient Management []  - 0 Hearing / Language / Visual special needs []  - 0 Assessment of Community assistance (transportation, D/C planning, etc.) []  - 0 Additional assistance / Altered mentation []  - 0 Support Surface(s) Assessment (bed, cushion, seat, etc.) INTERVENTIONS - Miscellaneous []  - External ear exam 0 []  - 0 Patient Transfer (multiple staff / Civil Service fast streamer / Similar devices) []  - 0 Simple Staple / Suture removal (25 or less) []  - 0 Complex Staple / Suture removal (26 or more) []  - 0 Hypo/Hyperglycemic Management (do not check if billed separately) []  - 0 Ankle / Brachial Index (ABI) - do not check if billed separately Has the patient been seen at the hospital within the last three years: Yes Total Score: 0 Level Of Care: ____ Allison Norman ([id number]) Electronic Signature(s) Signed: 10/11/2020 4:14:40 PM By: Donnamarie Poag Entered By: Donnamarie Poag on 10/11/2020 15:04:33 Emma,  [name] ([id number]) -------------------------------------------------------------------------------- Encounter Discharge Information Details Patient Name: Allison Norman, Allison Norman. Date of Service: 10/11/2020 2:30  PM Medical Record Number: [id number] Patient Account Number: [id number] Date of Birth/Sex: June 15, 1924 (87 y.o. F) Treating RN: Donnamarie Poag Primary Care Yvana Samonte: Lona Kettle Other Clinician: Referring Drianna Chandran: Lona Kettle Treating Leandre Wien/Extender: Skipper Cliche in Treatment: 2 Encounter Discharge Information Items Discharge Condition: Stable Ambulatory Status: Walker Discharge Destination: Home Transportation: Private Auto Accompanied By: caregiver Schedule Follow-up Appointment: Yes Clinical Summary of Care: Electronic Signature(s) Signed: 10/11/2020 4:14:40 PM By: Donnamarie Poag Entered By: Donnamarie Poag on 10/11/2020 15:05:54 Elden, [name] ([id number]) -------------------------------------------------------------------------------- Lower Extremity Assessment Details Patient Name: Allison Norman, Allison Norman. Date of Service: 10/11/2020 2:30 PM Medical Record Number: [id number] Patient Account Number: [id number] Date of Birth/Sex: 1924-02-07 (87 y.o. F) Treating RN: Donnamarie Poag Primary Care Tayven Renteria: Lona Kettle Other Clinician: Referring Zeph Riebel: Lona Kettle Treating Baylyn Sickles/Extender: Jeri Cos Weeks in Treatment: 2 Edema Assessment Assessed: [Left: No] [Right: Yes] Edema: [Left: N] [Right: o] Calf Left: Right: Point of Measurement: 35 cm From Medial Instep 34 cm Ankle Left: Right: Point of Measurement: 10 cm From Medial Instep 21 cm Knee To Floor Left: Right: From Medial Instep 44 cm Vascular Assessment Pulses: Dorsalis Pedis Palpable: [Right:Yes] Electronic Signature(s) Signed: 10/11/2020 4:14:40 PM By: Donnamarie Poag Entered By: Donnamarie Poag on 10/11/2020 14:31:54 Rehberg, Jennet Loletha Grayer  ([id number]) -------------------------------------------------------------------------------- Multi Wound Chart Details Patient Name: Allison Norman. Date of Service: 10/11/2020 2:30 PM Medical Record Number: [id number] Patient Account Number: [id number] Date of Birth/Sex: [birth year] (87 y.o. F) Treating RN: Donnamarie Poag Primary Care Ezana Hubbert: Lona Kettle Other Clinician: Referring Takeia Ciaravino: Lona Kettle Treating Deidra Spease/Extender: Skipper Cliche in Treatment: 2 Vital Signs Height(in): 65.5 Pulse(bpm): 97 Weight(lbs): 127 Blood Pressure(mmHg): 156/88 Body Mass Index(BMI): 21 Temperature(F): 98.3 Respiratory Rate(breaths/min): 16 Photos: [N/A:N/A] Wound Location: Right, Anterior Knee Right, Proximal, Anterior Lower Leg N/A Wounding Event: Laceration Shear/Friction N/A Primary Etiology: Trauma, Other Abrasion N/A Comorbid History: Anemia, Arrhythmia, Hypertension, Anemia, Arrhythmia, Hypertension, N/A Osteoarthritis Osteoarthritis Date Acquired: 08/27/2020 10/09/2020 N/A Weeks of Treatment: 2 0 N/A Wound Status: Open Open N/A Measurements L x W x D (cm) 2x2x0.1 1.2x2x0.1 N/A Area (cm) : 3.142 1.885 N/A Volume (cm) : 0.314 0.188 N/A % Reduction in Area: 75.30% N/A N/A % Reduction in Volume: 91.80% N/A N/A Classification: Full Thickness Without Exposed Full Thickness Without Exposed N/A Support Structures Support Structures Exudate Amount: Medium Medium N/A Exudate Type: Serosanguineous Serosanguineous N/A Exudate Color: red, brown red, brown N/A Granulation Amount: Large (67-100%) Large (67-100%) N/A Granulation Quality: Red, Pink, Hyper-granulation Red, Pink N/A Necrotic Amount: Small (1-33%) Small (1-33%) N/A Exposed Structures: Fat Layer (Subcutaneous Tissue): Fat Layer (Subcutaneous Tissue): N/A Yes Yes Fascia: No Fascia: No Tendon: No Tendon: No Muscle: No Muscle: No Joint: No Joint: No Bone: No Bone: No Epithelialization: None N/A N/A Treatment  Notes Electronic Signature(s) Signed: 10/11/2020 4:14:40 PM By: Donnamarie Poag Entered By: Donnamarie Poag on 10/11/2020 14:33:38 Mccalip, [name] ([id number]) -------------------------------------------------------------------------------- [place] Details Patient Name: Allison Norman, Allison Norman. Date of Service: 10/11/2020 2:30 PM Medical Record Number: [id number] Patient Account Number: [id number] Date of Birth/Sex: [birth year] (87 y.o. F) Treating RN: Donnamarie Poag Primary Care Ling Flesch: Lona Kettle Other Clinician: Referring Jamani Eley: Lona Kettle Treating Amedio Bowlby/Extender: Skipper Cliche in Treatment: 2 Active Inactive Wound/Skin Impairment Nursing Diagnoses: Impaired tissue integrity Knowledge deficit related to smoking impact on wound healing Knowledge deficit related to ulceration/compromised skin integrity Goals: Ulcer/skin breakdown will have a volume reduction of 30% by week 4 Date Initiated: 09/27/2020 Target Resolution Date: 10/27/2020 Goal Status: Active Ulcer/skin breakdown will have a volume reduction of  50% by week 8 Date Initiated: 09/27/2020 Target Resolution Date: 11/27/2020 Goal Status: Active Ulcer/skin breakdown will have a volume reduction of 80% by week 12 Date Initiated: 09/27/2020 Target Resolution Date: 12/27/2020 Goal Status: Active Ulcer/skin breakdown will heal within 14 weeks Date Initiated: 09/27/2020 Target Resolution Date: 01/07/2021 Goal Status: Active Interventions: Assess patient/caregiver ability to obtain necessary supplies Assess patient/caregiver ability to perform ulcer/skin care regimen upon admission and as needed Assess ulceration(s) every visit Notes: Electronic Signature(s) Signed: 10/11/2020 4:14:40 PM By: Donnamarie Poag Entered By: Donnamarie Poag on 10/11/2020 14:33:30 Engh, [name] ([id number]) -------------------------------------------------------------------------------- Pain Assessment Details Patient Name: Allison Norman Date of Service: 10/11/2020 2:30 PM Medical Record Number: [id number] Patient Account Number: [id number] Date of Birth/Sex: 14-Jun-1924 (87 y.o. F) Treating RN: Donnamarie Poag Primary Care Yurianna Tusing: Lona Kettle Other Clinician: Referring Izyan Ezzell: Lona Kettle Treating Yifan Auker/Extender: Skipper Cliche in Treatment: 2 Active Problems Location of Pain Severity and Description of Pain Patient Has Paino No Site Locations Rate the pain. Current Pain Level: 0 Pain Management and Medication Current Pain Management: Electronic Signature(s) Signed: 10/11/2020 4:14:40 PM By: Donnamarie Poag Entered By: Donnamarie Poag on 10/11/2020 14:25:36 Gewirtz, [name] ([id number]) -------------------------------------------------------------------------------- Patient/Caregiver Education Details Patient Name: Allison Norman Date of Service: 10/11/2020 2:30 PM Medical Record Number: [id number] Patient Account Number: [id number] Date of Birth/Gender: 1924-10-12 (87 y.o. F) Treating RN: Donnamarie Poag Primary Care Physician: Lona Kettle Other Clinician: Referring Physician: Lona Kettle Treating Physician/Extender: Skipper Cliche in Treatment: 2 Education Assessment Education Provided To: Patient Education Topics Provided Basic Hygiene: Wound/Skin Impairment: Electronic Signature(s) Signed: 10/11/2020 4:14:40 PM By: Donnamarie Poag Entered By: Donnamarie Poag on 10/11/2020 15:04:48 Allison Norman ([id number]) -------------------------------------------------------------------------------- Wound Assessment Details Patient Name: Allison Norman, Allison C. Date of Service: 10/11/2020 2:30 PM Medical Record Number: [id number] Patient Account Number: [id number] Date of Birth/Sex: 13-Jun-1924 (87 y.o. F) Treating RN: Donnamarie Poag Primary Care Haelie Clapp: Lona Kettle Other Clinician: Referring Markes Shatswell: Lona Kettle Treating Malarie Tappen/Extender: Jeri Cos Weeks in Treatment: 2 Wound Status Wound Number:  1 Primary Etiology: Trauma, Other Wound Location: Right, Anterior Knee Wound Status: Open Wounding Event: Laceration Comorbid History: Anemia, Arrhythmia, Hypertension, Osteoarthritis Date Acquired: 08/27/2020 Weeks Of Treatment: 2 Clustered Wound: No Photos Wound Measurements Length: (cm) 2 Width: (cm) 2 Depth: (cm) 0.1 Area: (cm) 3.142 Volume: (cm) 0.314 % Reduction in Area: 75.3% % Reduction in Volume: 91.8% Epithelialization: None Tunneling: No Undermining: No Wound Description Classification: Full Thickness Without Exposed Support Structures Exudate Amount: Medium Exudate Type: Serosanguineous Exudate Color: red, brown Foul Odor After Cleansing: No Slough/Fibrino Yes Wound Bed Granulation Amount: Large (67-100%) Exposed Structure Granulation Quality: Red, Pink, Hyper-granulation Fascia Exposed: No Necrotic Amount: Small (1-33%) Fat Layer (Subcutaneous Tissue) Exposed: Yes Necrotic Quality: Adherent Slough Tendon Exposed: No Muscle Exposed: No Joint Exposed: No Bone Exposed: No Treatment Notes Wound #1 (Knee) Wound Laterality: Right, Anterior Cleanser Normal Saline Discharge Instruction: Wash your hands with soap and water. Remove old dressing, discard into plastic bag and place into trash. Cleanse the wound with Normal Saline prior to applying a clean dressing using gauze sponges, not tissues or cotton balls. Do not scrub or use excessive force. Pat dry using gauze sponges, not tissue or cotton balls. Allison Norman, Allison Norman ([id number]) Peri-Wound Care Topical Primary Dressing Hydrofera Blue Ready Transfer Foam, 2.5x2.5 (in/in) Discharge Instruction: Cut to fit-Apply Hydrofera Blue Ready to wound bed as directed Secondary Dressing Allevyn border foam dressing Discharge Instruction: Border foam dressing only/Allevyn/Zetuvit/Mepilex Secured With Compression Wrap Compression Stockings Add-Ons Electronic Signature(s)  Signed: 10/11/2020 4:14:40 PM By: Donnamarie Poag Entered By: Donnamarie Poag on 10/11/2020 14:27:59 Napoleon, [name] ([id number]) -------------------------------------------------------------------------------- Wound Assessment Details Patient Name: Allison Norman, Allison C. Date of Service: 10/11/2020 2:30 PM Medical Record Number: [id number] Patient Account Number: [id number] Date of Birth/Sex: 10/21/1924 (87 y.o. F) Treating RN: Donnamarie Poag Primary Care Perle Gibbon: Lona Kettle Other Clinician: Referring Diedre Maclellan: Lona Kettle Treating Matea Stanard/Extender: Jeri Cos Weeks in Treatment: 2 Wound Status Wound Number: 2 Primary Etiology: Abrasion Wound Location: Right, Proximal, Anterior Lower Leg Wound Status: Open Wounding Event: Shear/Friction Notes: stated occurred when bandage/tape removed Date Acquired: 10/09/2020 Comorbid History: Anemia, Arrhythmia, Hypertension, Osteoarthritis Weeks Of Treatment: 0 Clustered Wound: No Photos Wound Measurements Length: (cm) 1.2 Width: (cm) 2 Depth: (cm) 0.1 Area: (cm) 1.885 Volume: (cm) 0.188 % Reduction in Area: % Reduction in Volume: Tunneling: No Undermining: No Wound Description Classification: Full Thickness Without Exposed Support Structures Exudate Amount: Medium Exudate Type: Serosanguineous Exudate Color: red, brown Foul Odor After Cleansing: No Slough/Fibrino Yes Wound Bed Granulation Amount: Large (67-100%) Exposed Structure Granulation Quality: Red, Pink Fascia Exposed: No Necrotic Amount: Small (1-33%) Fat Layer (Subcutaneous Tissue) Exposed: Yes Necrotic Quality: Adherent Slough Tendon Exposed: No Muscle Exposed: No Joint Exposed: No Bone Exposed: No Treatment Notes Wound #2 (Lower Leg) Wound Laterality: Right, Anterior, Proximal Cleanser Normal Saline Discharge Instruction: Wash your hands with soap and water. Remove old dressing, discard into plastic bag and place into trash. Cleanse the wound with Normal Saline prior to applying a clean dressing using gauze  sponges, not tissues or cotton balls. Do not scrub or use excessive force. Pat dry using gauze sponges, not tissue or cotton balls. Allison Norman, Allison Norman ([id number]) Peri-Wound Care Topical Primary Dressing Hydrofera Blue Ready Transfer Foam, 2.5x2.5 (in/in) Discharge Instruction: Cut to fit-Apply Hydrofera Blue Ready to wound bed as directed Secondary Dressing Allevyn border foam dressing Discharge Instruction: Border foam dressing only/Allevyn/Zetuvit/Mepilex Secured With Compression Wrap Compression Stockings Add-Ons Electronic Signature(s) Signed: 10/11/2020 4:14:40 PM By: Donnamarie Poag Entered By: Donnamarie Poag on 10/11/2020 14:31:12 [place], [name] ([id number]) -------------------------------------------------------------------------------- Vitals Details Patient Name: Allison Norman. Date of Service: 10/11/2020 2:30 PM Medical Record Number: [id number] Patient Account Number: [id number] Date of Birth/Sex: [birth year] (87 y.o. F) Treating RN: Donnamarie Poag Primary Care Lanisa Ishler: Lona Kettle Other Clinician: Referring Mykeria Garman: Lona Kettle Treating Kaileah Shevchenko/Extender: Skipper Cliche in Treatment: 2 Vital Signs Time Taken: 14:18 Temperature (F): 98.3 Height (in): 65.5 Pulse (bpm): 97 Weight (lbs): 127 Respiratory Rate (breaths/min): 16 Body Mass Index (BMI): 20.8 Blood Pressure (mmHg): 156/88 Reference Range: 80 - 120 mg / dl Electronic Signature(s) Signed: 10/11/2020 4:14:40 PM By: Donnamarie Poag Entered ByDonnamarie Poag on 10/11/2020 14:25:29

## 2020-10-11 NOTE — Progress Notes (Addendum)
ALEXEI, EY ([id number]) Visit Report for 10/11/2020 Chief Complaint Document Details Patient Name: Allison Norman, Allison Norman. Date of Service: 10/11/2020 2:30 PM Medical Record Number: [id number] Patient Account Number: [id number] Date of Birth/Sex: May 12, 1924 (87 y.o. F) Treating RN: Donnamarie Poag Primary Care Provider: Lona Kettle Other Clinician: Referring Provider: Lona Kettle Treating Provider/Extender: Skipper Cliche in Treatment: 2 Information Obtained from: Patient Chief Complaint Right knee ulcer/laceration Electronic Signature(s) Signed: 10/11/2020 2:56:47 PM By: Worthy Keeler PA-C Entered By: Worthy Keeler on 10/11/2020 14:56:47 Allison Norman ([id number]) -------------------------------------------------------------------------------- HPI Details Patient Name: Allison Norman Date of Service: 10/11/2020 2:30 PM Medical Record Number: [id number] Patient Account Number: [id number] Date of Birth/Sex: [birth year] (87 y.o. F) Treating RN: Donnamarie Poag Primary Care Provider: Lona Kettle Other Clinician: Referring Provider: Lona Kettle Treating Provider/Extender: Skipper Cliche in Treatment: 2 History of Present Illness HPI Description: 09/27/2020 upon evaluation today patient appears to be doing somewhat poorly in regard to significant skin tear of her right knee or just below the knee area. This actually occurred on 08/27/2020 and unfortunately being that she is on Eliquis she had a tremendous amount of bleeding. In the ER it was consulted for the surgeon to come down to take a look at this but they advised they did not need to and it just need to be sutured up in the ER. They did their best to get this closed and then subsequently a couple weeks later took the sutures out and applied Steri-Strips. Unfortunately this just has not completely sealed up and continues to bleed some but also has some necrotic tissue noted on the surface of the wound. She does have a history  of paroxysmal atrial fibrillation and for that reason is on long-term anticoagulant, Eliquis, for this. 10/04/2020 upon evaluation today patient appears to be doing better in regard to her leg ulcer today. Fortunately there does not appear to be any evidence of active infection which is great news and overall I am extremely pleased with where we stand. I do not see any evidence whatsoever of infection and I think she is making great progress. 10/11/2020 upon evaluation today patient's right knee actually appears to be doing much better. With that being said it appears there was a dressing other than the silicone dressing that was placed on her knee and subsequently caused a skin tear below. This fortunately is really not too bad at all and seems to be healing nicely with the [facility] along nonetheless we do to make sure that silicone dressings only are used for this patient. Anything else is too hard on her skin and just not can be appropriate to be honest. Electronic Signature(s) Signed: 10/12/2020 8:15:52 AM By: Worthy Keeler PA-C Entered By: Worthy Keeler on 10/12/2020 08:15:52 Ishikawa, Renell C. ([id number]) -------------------------------------------------------------------------------- Otelia Sergeant TISS Details Patient Name: Allison Norman. Date of Service: 10/11/2020 2:30 PM Medical Record Number: [id number] Patient Account Number: [id number] Date of Birth/Sex: [birth year] (87 y.o. F) Treating RN: Donnamarie Poag Primary Care Provider: Lona Kettle Other Clinician: Referring Provider: Lona Kettle Treating Provider/Extender: Skipper Cliche in Treatment: 2 Procedure Performed for: Wound #1 Right,Anterior Knee Performed By: Physician Tommie Sams., PA-C Post Procedure Diagnosis Same as Pre-procedure Notes 1 silver nitrate stick used and tolerated well Electronic Signature(s) Signed: 10/11/2020 4:14:40 PM By: Donnamarie Poag Entered By: Donnamarie Poag on 10/11/2020  15:03:59 Babel, [name] ([id number]) -------------------------------------------------------------------------------- Physical Exam Details Patient Name: Allison Norman, Allison Norman. Date of Service: 10/11/2020 2:30  PM Medical Record Number: [id number] Patient Account Number: [id number] Date of Birth/Sex: 1924/04/09 (87 y.o. F) Treating RN: Donnamarie Poag Primary Care Provider: Lona Kettle Other Clinician: Referring Provider: Lona Kettle Treating Provider/Extender: Jeri Cos Weeks in Treatment: 2 Constitutional Well-nourished and well-hydrated in no acute distress. Respiratory normal breathing without difficulty. Psychiatric this patient is able to make decisions and demonstrates good insight into disease process. Alert and Oriented x 3. pleasant and cooperative. Notes Upon inspection patient's wound bed actually showed signs of good granulation epithelization at this point. Fortunately there does not appear to be any evidence of infection which is great news and overall I am extremely pleased with where we stand at this point. Electronic Signature(s) Signed: 10/12/2020 8:16:08 AM By: Worthy Keeler PA-C Entered By: Worthy Keeler on 10/12/2020 08:16:08 Allison Norman ([id number]) -------------------------------------------------------------------------------- Physician Orders Details Patient Name: Allison Norman Date of Service: 10/11/2020 2:30 PM Medical Record Number: [id number] Patient Account Number: [id number] Date of Birth/Sex: 11-24-1924 (87 y.o. F) Treating RN: Donnamarie Poag Primary Care Provider: Lona Kettle Other Clinician: Referring Provider: Lona Kettle Treating Provider/Extender: Skipper Cliche in Treatment: 2 Verbal / Phone Orders: No Diagnosis Coding ICD-10 Coding Code Description S81.011A Laceration without foreign body, right knee, initial encounter L98.492 Non-pressure chronic ulcer of skin of other sites with fat layer exposed I48.0 Paroxysmal atrial  fibrillation Z79.01 Long term (current) use of anticoagulants Follow-up Appointments Wound #1 Right,Anterior Knee o Return Appointment in 1 week. o Nurse Visit as needed - call to schedule if needed [place] for wound care. May utilize formulary equivalent dressing for wound treatment orders unless otherwise specified. Home Health Nurse may visit PRN to address patientos wound care needs. o Scheduled days for dressing changes to be completed; exception, patient has scheduled wound care visit that day. - Weekly appt at wound center Bathing/ Shower/ Hygiene o Clean wound with Normal Saline or wound cleanser. o May shower with wound dressing protected with water repellent cover or cast protector. o No tub bath. Edema Control - Lymphedema / Segmental Compressive Device / Other Wound #1 Right,Anterior Knee o Elevate legs to the level of the heart and pump ankles as often as possible o Elevate leg(s) parallel to the floor when sitting. o DO YOUR BEST to sleep in the bed at night. DO NOT sleep in your recliner. Long hours of sitting in a recliner leads to swelling of the legs and/or potential wounds on your backside. Wound Treatment Wound #1 - Knee Wound Laterality: Right, Anterior Cleanser: Normal Saline (Generic) 3 x Per Week/30 Days Discharge Instructions: Wash your hands with soap and water. Remove old dressing, discard into plastic bag and place into trash. Cleanse the wound with Normal Saline prior to applying a clean dressing using gauze sponges, not tissues or cotton balls. Do not scrub or use excessive force. Pat dry using gauze sponges, not tissue or cotton balls. Primary Dressing: Hydrofera Blue Ready Transfer Foam, 2.5x2.5 (in/in) (Generic) 3 x Per Week/30 Days Discharge Instructions: Cut to fit-Apply Hydrofera Blue Ready to wound bed as directed Secondary Dressing: Allevyn border  foam dressing 3 x Per Week/30 Days Discharge Instructions: Border foam dressing only/Allevyn/Zetuvit/Mepilex Wound #2 - Lower Leg Wound Laterality: Right, Anterior, Proximal Cleanser: Normal Saline (Generic) 3 x Per Week/30 Days Discharge Instructions: Wash your hands with soap and water. Remove old dressing, discard into plastic bag and place into trash. Cleanse the wound with Normal Saline  prior to applying a clean dressing using gauze sponges, not tissues or cotton balls. Do not scrub or use excessive force. Pat dry using gauze sponges, not tissue or cotton balls. Primary Dressing: Hydrofera Blue Ready Transfer Foam, 2.5x2.5 (in/in) (Generic) 3 x Per Week/30 Days Discharge Instructions: Cut to fit-Apply Hydrofera Blue Ready to wound bed as directed BRANDILYNN, TAORMINA ([id number]) Secondary Dressing: Allevyn border foam dressing 3 x Per Week/30 Days Discharge Instructions: Border foam dressing only/Allevyn/Zetuvit/Mepilex Notes Do NOT use border gauze-border foam only! Skin integrity is torn by tape/border gauze Electronic Signature(s) Signed: 10/11/2020 4:14:40 PM By: Donnamarie Poag Signed: 10/12/2020 9:28:39 AM By: Worthy Keeler PA-C Entered By: Donnamarie Poag on 10/11/2020 15:03:17 Allison Norman ([id number]) -------------------------------------------------------------------------------- Problem List Details Patient Name: CLOYCE, Allison Norman. Date of Service: 10/11/2020 2:30 PM Medical Record Number: [id number] Patient Account Number: [id number] Date of Birth/Sex: November 16, 1924 (87 y.o. F) Treating RN: Donnamarie Poag Primary Care Provider: Lona Kettle Other Clinician: Referring Provider: Lona Kettle Treating Provider/Extender: Skipper Cliche in Treatment: 2 Active Problems ICD-10 Encounter Code Description Active Date MDM Diagnosis S81.011A Laceration without foreign body, right knee, initial encounter 09/27/2020 No Yes L98.492 Non-pressure chronic ulcer of skin of other sites with fat  layer exposed 09/27/2020 No Yes I48.0 Paroxysmal atrial fibrillation 09/27/2020 No Yes Z79.01 Long term (current) use of anticoagulants 09/27/2020 No Yes Inactive Problems Resolved Problems Electronic Signature(s) Signed: 10/11/2020 2:56:40 PM By: Worthy Keeler PA-C Entered By: Worthy Keeler on 10/11/2020 14:56:39 Rowand, [name] ([id number]) -------------------------------------------------------------------------------- Progress Note Details Patient Name: Allison Norman. Date of Service: 10/11/2020 2:30 PM Medical Record Number: [id number] Patient Account Number: [id number] Date of Birth/Sex: Apr 28, 1924 (87 y.o. F) Treating RN: Donnamarie Poag Primary Care Provider: Lona Kettle Other Clinician: Referring Provider: Lona Kettle Treating Provider/Extender: Skipper Cliche in Treatment: 2 Subjective Chief Complaint Information obtained from Patient Right knee ulcer/laceration History of Present Illness (HPI) 09/27/2020 upon evaluation today patient appears to be doing somewhat poorly in regard to significant skin tear of her right knee or just below the knee area. This actually occurred on 08/27/2020 and unfortunately being that she is on Eliquis she had a tremendous amount of bleeding. In the ER it was consulted for the surgeon to come down to take a look at this but they advised they did not need to and it just need to be sutured up in the ER. They did their best to get this closed and then subsequently a couple weeks later took the sutures out and applied Steri-Strips. Unfortunately this just has not completely sealed up and continues to bleed some but also has some necrotic tissue noted on the surface of the wound. She does have a history of paroxysmal atrial fibrillation and for that reason is on long-term anticoagulant, Eliquis, for this. 10/04/2020 upon evaluation today patient appears to be doing better in regard to her leg ulcer today. Fortunately there does not appear to be  any evidence of active infection which is great news and overall I am extremely pleased with where we stand. I do not see any evidence whatsoever of infection and I think she is making great progress. 10/11/2020 upon evaluation today patient's right knee actually appears to be doing much better. With that being said it appears there was a dressing other than the silicone dressing that was placed on her knee and subsequently caused a skin tear below. This fortunately is really not too bad at all and seems to be healing nicely with the  Hydrofera Blue along nonetheless we do to make sure that silicone dressings only are used for this patient. Anything else is too hard on her skin and just not can be appropriate to be honest. Objective Constitutional Well-nourished and well-hydrated in no acute distress. Vitals Time Taken: 2:18 PM, Height: 65.5 in, Weight: 127 lbs, BMI: 20.8, Temperature: 98.3 F, Pulse: 97 bpm, Respiratory Rate: 16 breaths/min, Blood Pressure: 156/88 mmHg. Respiratory normal breathing without difficulty. Psychiatric this patient is able to make decisions and demonstrates good insight into disease process. Alert and Oriented x 3. pleasant and cooperative. General Notes: Upon inspection patient's wound bed actually showed signs of good granulation epithelization at this point. Fortunately there does not appear to be any evidence of infection which is great news and overall I am extremely pleased with where we stand at this point. Integumentary (Hair, Skin) Wound #1 status is Open. Original cause of wound was Laceration. The date acquired was: 08/27/2020. The wound has been in treatment 2 weeks. The wound is located on the Right,Anterior Knee. The wound measures 2cm length x 2cm width x 0.1cm depth; 3.142cm^2 area and 0.314cm^3 volume. There is Fat Layer (Subcutaneous Tissue) exposed. There is no tunneling or undermining noted. There is a medium amount of serosanguineous drainage  noted. There is large (67-100%) red, pink, hyper - granulation within the wound bed. There is a small (1-33%) amount of necrotic tissue within the wound bed including Adherent Slough. Wound #2 status is Open. Original cause of wound was Shear/Friction. The date acquired was: 10/09/2020. The wound is located on the Right,Proximal,Anterior Lower Leg. The wound measures 1.2cm length x 2cm width x 0.1cm depth; 1.885cm^2 area and 0.188cm^3 volume. There is Fat Layer (Subcutaneous Tissue) exposed. There is no tunneling or undermining noted. There is a medium amount of serosanguineous drainage noted. There is large (67-100%) red, pink granulation within the wound bed. There is a small (1-33%) amount of necrotic tissue within the wound bed including Adherent Slough. TAKARI, DUNCOMBE ([id number]) Assessment Active Problems ICD-10 Laceration without foreign body, right knee, initial encounter Non-pressure chronic ulcer of skin of other sites with fat layer exposed Paroxysmal atrial fibrillation Long term (current) use of anticoagulants Procedures Wound #1 Pre-procedure diagnosis of Wound #1 is a Trauma, Other located on the Right,Anterior Knee . An CHEM CAUT GRANULATION TISS procedure was performed by Tommie Sams., PA-C. Post procedure Diagnosis Wound #1: Same as Pre-Procedure Notes: 1 silver nitrate stick used and tolerated well Plan Follow-up Appointments: Wound #1 Right,Anterior Knee: Return Appointment in 1 week. Nurse Visit as needed - call to schedule if needed Home Health: [place] for wound care. May utilize formulary equivalent dressing for wound treatment orders unless otherwise specified. Home Health Nurse may visit PRN to address patient s wound care needs. Scheduled days for dressing changes to be completed; exception, patient has scheduled wound care visit that day. - Weekly appt at wound center Bathing/ Shower/  Hygiene: Clean wound with Normal Saline or wound cleanser. May shower with wound dressing protected with water repellent cover or cast protector. No tub bath. Edema Control - Lymphedema / Segmental Compressive Device / Other: Wound #1 Right,Anterior Knee: Elevate legs to the level of the heart and pump ankles as often as possible Elevate leg(s) parallel to the floor when sitting. DO YOUR BEST to sleep in the bed at night. DO NOT sleep in your recliner. Long hours of sitting in a recliner leads to swelling  of the legs and/or potential wounds on your backside. General Notes: Do NOT use border gauze-border foam only! Skin integrity is torn by tape/border gauze WOUND #1: - Knee Wound Laterality: Right, Anterior Cleanser: Normal Saline (Generic) 3 x Per Week/30 Days Discharge Instructions: Wash your hands with soap and water. Remove old dressing, discard into plastic bag and place into trash. Cleanse the wound with Normal Saline prior to applying a clean dressing using gauze sponges, not tissues or cotton balls. Do not scrub or use excessive force. Pat dry using gauze sponges, not tissue or cotton balls. Primary Dressing: Hydrofera Blue Ready Transfer Foam, 2.5x2.5 (in/in) (Generic) 3 x Per Week/30 Days Discharge Instructions: Cut to fit-Apply Hydrofera Blue Ready to wound bed as directed Secondary Dressing: Allevyn border foam dressing 3 x Per Week/30 Days Discharge Instructions: Border foam dressing only/Allevyn/Zetuvit/Mepilex WOUND #2: - Lower Leg Wound Laterality: Right, Anterior, Proximal Cleanser: Normal Saline (Generic) 3 x Per Week/30 Days Discharge Instructions: Wash your hands with soap and water. Remove old dressing, discard into plastic bag and place into trash. Cleanse the wound with Normal Saline prior to applying a clean dressing using gauze sponges, not tissues or cotton balls. Do not scrub or use excessive force. Pat dry using gauze sponges, not tissue or cotton balls. Primary  Dressing: Hydrofera Blue Ready Transfer Foam, 2.5x2.5 (in/in) (Generic) 3 x Per Week/30 Days Discharge Instructions: Cut to fit-Apply Hydrofera Blue Ready to wound bed as directed Secondary Dressing: Allevyn border foam dressing 3 x Per Week/30 Days Discharge Instructions: Border foam dressing only/Allevyn/Zetuvit/Mepilex Allison Norman, Allison Norman. ([id number]) 1. Would recommend that we going continue with wound care measures as before and the patient is in agreement with that plan. This includes the use of the [facility] which I think is doing awesome. 2. I am also can recommend an Allevyn dressing to cover which is a very safe to the skin border foam dressing which I think will be a good option for the patient as well. They should not be using a Telfa island dressing. We will see patient back for reevaluation in 1 week here in the clinic. If anything worsens or changes patient will contact our office for additional recommendations. Electronic Signature(s) Signed: 10/12/2020 8:16:43 AM By: Worthy Keeler PA-C Entered By: Worthy Keeler on 10/12/2020 08:16:42 Allison Norman ([id number]) -------------------------------------------------------------------------------- SuperBill Details Patient Name: Allison Norman Date of Service: 10/11/2020 Medical Record Number: [id number] Patient Account Number: [id number] Date of Birth/Sex: 09-24-1924 (87 y.o. F) Treating RN: Donnamarie Poag Primary Care Provider: Lona Kettle Other Clinician: Referring Provider: Lona Kettle Treating Provider/Extender: Skipper Cliche in Treatment: 2 Diagnosis Coding ICD-10 Codes Code Description [phone] Laceration without foreign body, right knee, initial encounter L98.492 Non-pressure chronic ulcer of skin of other sites with fat layer exposed I48.0 Paroxysmal atrial fibrillation Z79.01 Long term (current) use of anticoagulants Facility Procedures CPT4 Code: [id number] Description: 79024 - CHEM CAUT GRANULATION  TISS Modifier: Quantity: 1 CPT4 Code: Description: ICD-10 Diagnosis Description L98.492 Non-pressure chronic ulcer of skin of other sites with fat layer expose Modifier: d Quantity: Physician Procedures CPT4 Code: 0973532 Description: 99242 - WC PHYS CHEM CAUT GRAN TISSUE Modifier: Quantity: 1 CPT4 Code: Description: ICD-10 Diagnosis Description L98.492 Non-pressure chronic ulcer of skin of other sites with fat layer exposed Modifier: Quantity: Electronic Signature(s) Signed: 10/12/2020 8:17:05 AM By: Worthy Keeler PA-C Previous Signature: 10/11/2020 4:14:40 PM Version By: Donnamarie Poag Entered By: Worthy Keeler on 10/12/2020 08:17:05

## 2020-10-14 DIAGNOSIS — N184 Chronic kidney disease, stage 4 (severe): Secondary | ICD-10-CM | POA: Diagnosis not present

## 2020-10-14 DIAGNOSIS — Z853 Personal history of malignant neoplasm of breast: Secondary | ICD-10-CM | POA: Diagnosis not present

## 2020-10-14 DIAGNOSIS — Z7901 Long term (current) use of anticoagulants: Secondary | ICD-10-CM | POA: Diagnosis not present

## 2020-10-14 DIAGNOSIS — I499 Cardiac arrhythmia, unspecified: Secondary | ICD-10-CM | POA: Diagnosis not present

## 2020-10-14 DIAGNOSIS — K5900 Constipation, unspecified: Secondary | ICD-10-CM | POA: Diagnosis not present

## 2020-10-14 DIAGNOSIS — I4819 Other persistent atrial fibrillation: Secondary | ICD-10-CM | POA: Diagnosis not present

## 2020-10-14 DIAGNOSIS — M353 Polymyalgia rheumatica: Secondary | ICD-10-CM | POA: Diagnosis not present

## 2020-10-14 DIAGNOSIS — Z950 Presence of cardiac pacemaker: Secondary | ICD-10-CM | POA: Diagnosis not present

## 2020-10-14 DIAGNOSIS — Z85828 Personal history of other malignant neoplasm of skin: Secondary | ICD-10-CM | POA: Diagnosis not present

## 2020-10-14 DIAGNOSIS — M810 Age-related osteoporosis without current pathological fracture: Secondary | ICD-10-CM | POA: Diagnosis not present

## 2020-10-14 DIAGNOSIS — I129 Hypertensive chronic kidney disease with stage 1 through stage 4 chronic kidney disease, or unspecified chronic kidney disease: Secondary | ICD-10-CM | POA: Diagnosis not present

## 2020-10-14 DIAGNOSIS — Z9981 Dependence on supplemental oxygen: Secondary | ICD-10-CM | POA: Diagnosis not present

## 2020-10-14 DIAGNOSIS — H3530 Unspecified macular degeneration: Secondary | ICD-10-CM | POA: Diagnosis not present

## 2020-10-14 DIAGNOSIS — Z9181 History of falling: Secondary | ICD-10-CM | POA: Diagnosis not present

## 2020-10-14 DIAGNOSIS — Z87891 Personal history of nicotine dependence: Secondary | ICD-10-CM | POA: Diagnosis not present

## 2020-10-14 DIAGNOSIS — S81001D Unspecified open wound, right knee, subsequent encounter: Secondary | ICD-10-CM | POA: Diagnosis not present

## 2020-10-14 DIAGNOSIS — D62 Acute posthemorrhagic anemia: Secondary | ICD-10-CM | POA: Diagnosis not present

## 2020-10-14 DIAGNOSIS — R339 Retention of urine, unspecified: Secondary | ICD-10-CM | POA: Diagnosis not present

## 2020-10-15 NOTE — Progress Notes (Signed)
Cardiology Office Note:    Date:  10/15/2020   ID:  Allison Norman, DOB February 23, 1924, MRN [id number]  PCP:  [name], MD   [facility] Cardiologist:  Pixie Casino, MD      Referring MD: [name], MD   Follow-up for resistant hypertension  History of Present Illness:    Allison Norman is a 87 y.o. female with a hx of atrial fibrillation, resistant hypertension, status post pacemaker insertion 07/22/2019, and CKD stage IV.   She was last seen by clinical pharmacist to review her blood pressure.  Blood pressure goal was set up less than 140/90.   She previously tried: Amlodipine 10mg  - LEE Doxazosin Hydralazine Diltiazem   Her current blood pressure medications include: Amlodipine 5mg  daily Carvedilol 3.125mg  twice daily Clonidine 0.3mg  three times daily Furosemide 20mg  daily Losartan 100mg  daily Minoxidil 5mg  in AM and 2.5mg  in PM   She was last seen by clinical pharmacist 06/13/2020.  During that time her blood pressure was 120/48 and her pulse was 57.  Previous blood pressure readings were reviewed and showed significant improvement with only subtle blood pressures over 409 systolic.  She reported an improvement in her energy levels and denied side effects with her medication regimen.  She noted less variability with her daily blood pressure measurements.  Her renal function and electrolytes are stable.  Follow-up was planned for 6-8 weeks.   She presented to the clinic 08/08/2020 for follow-up and stated she occasionally noticed increased heart rates in the evening when she laid on her left side.  She also occasionally went to check her pulse with her pulse oximeter and reported that her heart rate would be in the 90s and 100s.  She did not have any symptoms with the increased heart rates and unless she checked her pulse was not aware of increased heart rate.  We reviewed her remote device reports which showed appropriate histograms, battery,  and weights.  She reported that she did not drink many fluids through the day and may drink 3 to 4 cups of water total.  We discussed the importance of maintaining p.o. hydration.  I planned her follow-up in 6 months and as needed.  I also planned follow-up with EP in the next 3 to 4 months.  She was admitted from the emergency department on 09/05/2020 and was discharged on 09/09/2020.  She presented to the emergency department with complaints of generalized fatigue and weakness.  She had presented to the emergency department on 08/31/2020 after a fall at home and a laceration on her right knee.  She was treated with Flomax and a Foley catheter was placed.  In the emergency department on return she was found to be in atrial fibrillation with RVR and elevated BNP.  She reported compliance with her amiodarone and carvedilol.  She denied bleeding issues and reported compliance on her apixaban.  Her carvedilol was increased and she was discharged in stable condition.  She presents to the clinic today for follow-up evaluation states she feels well except for her right knee.  She has been seeing wound care who has been monitoring and redressing her knee.  She is using a Rollator for ambulation.  Her blood pressures been well controlled at home in the 120s over 70s.  She reports that during her admissions each provider that she saw changed her blood pressure medication.  Her losartan was discontinued as well as her amlodipine.  She has not yet been  able to discontinue her Foley catheter.  She is following with urology in hopes that she will be able to have it soon.  She has been walking regularly 4-5 times per day for short distances.  She continues to follow a heart healthy low-sodium diet.  I will decrease her furosemide to 20 mg daily and have her follow-up in 2 to 3 months.   Today she denies chest pain, shortness of breath, lower extremity edema, fatigue, palpitations, melena, hematuria, hemoptysis, diaphoresis,  weakness, presyncope, syncope, orthopnea, and PND.    Past Medical History:  Diagnosis Date   Atrial fibrillation ([place]) 2017   a. s/p DCCV in 10/2015  b. recurrent in 08/2016 --> rate-control pursued.    Cancer ([place])    Breast   CKD (chronic kidney disease)    GCA (giant cell arteritis) ([facility])    Hordeolum internum left lower eyelid 06/28/2019   Hypertension    Macular degeneration    Osteoporosis    PMR (polymyalgia rheumatica) ([facility])    Resistant hypertension 03/15/2019   Shortness of breath 03/15/2019   Skin cancer     Past Surgical History:  Procedure Laterality Date   CARDIOVERSION N/A 10/18/2015   Procedure: CARDIOVERSION;  Surgeon: Jerline Pain, MD;  Location: [facility];  Service: Cardiovascular;  Laterality: N/A;   CARDIOVERSION N/A 02/20/2017   Procedure: CARDIOVERSION;  Surgeon: Pixie Casino, MD;  Location: [facility];  Service: Cardiovascular;  Laterality: N/A;   CARDIOVERSION N/A 06/18/2017   Procedure: CARDIOVERSION;  Surgeon: Sanda Klein, MD;  Location: [facility] END[facility];  Service: Cardiovascular;  Laterality: N/A;   CARDIOVERSION N/A 12/21/2018   Procedure: CARDIOVERSION;  Surgeon: Thayer Headings, MD;  Location: [facility] ENDOSCOPY;  Service: Cardiovascular;  Laterality: N/A;   KNEE SURGERY  2013   MASTECTOMY  1998   left side   PACEMAKER IMPLANT N/A 07/22/2019   Procedure: PACEMAKER IMPLANT;  Surgeon: Evans Lance, MD;  Location: [facility] CV [facility];  Service: Cardiovascular;  Laterality: N/A;    Current Medications: No outpatient medications have been marked as taking for the 10/16/20 encounter (Appointment) with Deberah Pelton, NP.     Allergies:   Codeine, Penicillins, Doxazosin, Doxycycline, Doxycycline hyclate, and Hydralazine   Social History   Socioeconomic History   Marital status: Married    Spouse name: Not on file   Number of children: 2   Years of education: Not on file   Highest education level: Not on file  Occupational History   Not on  file  Tobacco Use   Smoking status: Former    Types: Cigarettes    Quit date: 08/31/1966    Years since quitting: 54.1   Smokeless tobacco: Never  Vaping Use   Vaping Use: Never used  Substance and Sexual Activity   Alcohol use: No   Drug use: No   Sexual activity: Not on file  Other Topics Concern   Not on file  Social History Narrative   epworth sleepiness scale = 8 (08/31/15)   Social Determinants of Health   Financial Resource Strain: Not on file  Food Insecurity: Not on file  Transportation Needs: Not on file  Physical Activity: Not on file  Stress: Not on file  Social Connections: Not on file     Family History: The patient's family history includes Breast cancer in her daughter; Heart disease in her sister and sister; Kidney failure in her father; Stroke in her mother.  ROS:   Please see the history of present  illness.     All other systems reviewed and are negative.   Risk Assessment/Calculations:    CHA2DS2-VASc Score = 4    This indicates a 4.8% annual risk of stroke. The patient's score is based upon: CHF History: 0 HTN History: 1 Diabetes History: 0 Stroke History: 0 Vascular Disease History: 0 Age Score: 2 Gender Score: 1          Physical Exam:    VS:  There were no vitals taken for this visit.    Wt Readings from Last 3 Encounters:  09/05/20 140 lb (63.5 kg)  09/02/20 145 lb 8.1 oz (66 kg)  08/30/20 145 lb 1 oz (65.8 kg)     GEN:  Well nourished, well developed in no acute distress HEENT: Normal NECK: No JVD; No carotid bruits LYMPHATICS: No lymphadenopathy CARDIAC: Irregularly irregular, no murmurs, rubs, gallops RESPIRATORY:  Clear to auscultation without rales, wheezing or rhonchi  ABDOMEN: Soft, non-tender, non-distended MUSCULOSKELETAL:  No edema; No deformity  SKIN: Warm and dry NEUROLOGIC:  Alert and oriented x 3 PSYCHIATRIC:  Normal affect    EKGs/Labs/Other Studies Reviewed:    The following studies were reviewed  today:   Echocardiogram 01/29/2018 IMPRESSIONS     1. Left ventricular ejection fraction, by estimation, is 65 to 70%. The  left ventricle has normal function. The left ventricle has no regional  wall motion abnormalities. There is severe concentric left ventricular  hypertrophy. Left ventricular diastolic   parameters are consistent with Grade III diastolic dysfunction  (restrictive). Elevated left atrial pressure.   2. Right ventricular systolic function is mildly reduced. The right  ventricular size is mildly enlarged. There is severely elevated pulmonary  artery systolic pressure. The estimated right ventricular systolic  pressure is 30.1 mmHg.   3. Left atrial size was severely dilated.   4. Right atrial size was severely dilated.   5. The mitral valve is normal in structure. Mild to moderate mitral valve  regurgitation. No evidence of mitral stenosis. Moderate mitral annular  calcification.   6. Tricuspid valve regurgitation is moderate to severe.   7. The aortic valve is normal in structure. There is severe calcifcation  of the aortic valve. There is severe thickening of the aortic valve.  Aortic valve regurgitation is not visualized. Mild aortic valve stenosis.  Aortic valve mean gradient measures  12.0 mmHg.   8. The inferior vena cava is normal in size with greater than 50%  respiratory variability, suggesting right atrial pressure of 3 mmHg.  EKG:  EKG is  ordered today.  The ekg ordered today demonstrates atrial fibrillation with frequent PVCs right axis deviation right ventricular hypertrophy 82 bpm  EKG 08/08/2020  V-paced rhythm 76 bpm  Recent Labs: 09/05/2020: B Natriuretic Peptide 874.2 09/06/2020: ALT 18; TSH 3.300 09/08/2020: Hemoglobin 8.8; Platelets 352 09/09/2020: BUN 26; Creatinine, Ser 1.95; Magnesium 2.3; Potassium 4.2; Sodium 133  Recent Lipid Panel    Component Value Date/Time   CHOL  07/04/2009 0418    81        ATP III CLASSIFICATION:  <200     mg/dL    Desirable  200-239  mg/dL   Borderline High  >=240    mg/dL   High          TRIG 64 07/04/2009 0418   HDL 18 (L) 07/04/2009 0418   CHOLHDL 4.5 07/04/2009 0418   VLDL 13 07/04/2009 0418   LDLCALC  07/04/2009 0418    50  Total Cholesterol/HDL:CHD Risk Coronary Heart Disease Risk Table                     Men   Women  1/2 Average Risk   3.4   3.3  Average Risk       5.0   4.4  2 X Average Risk   9.6   7.1  3 X Average Risk  23.4   11.0        Use the calculated Patient Ratio above and the CHD Risk Table to determine the patient's CHD Risk.        ATP III CLASSIFICATION (LDL):  <100     mg/dL   Optimal  100-129  mg/dL   Near or Above                    Optimal  130-159  mg/dL   Borderline  160-189  mg/dL   High  >190     mg/dL   Very High    ASSESSMENT & PLAN    Acute on chronic diastolic CHF-euvolemic today.  No increased DOE or activity intolerance.  Echocardiogram 1/22 showed an ejection fraction of 65-70% with G3 DD.  She was noted to have significant pulmonary hypertension with an RV systolic pressure of 65. Continue furosemide, carvedilol, losartan Heart healthy low-sodium diet-salty 6 given Increase physical activity as tolerated Daily weights-contact office with a weight increase of 3 pounds overnight or 5 pounds in 1 week  Resistant hypertension-BP today 124/74.  Continues to be well controlled at home. Continue carvedilol 3.125 mg twice daily, clonidine 0.2 mg 2 times daily, furosemide 20 mg daily Heart healthy low-sodium diet-salty 6 given Increase physical activity as tolerated Maintain blood pressure log   Persistent atrial fibrillation-heart rate today 82 bpm.  Status post PPM.  Reports occasional increased heart rate with pulse checks.  Denies injuries and bleeding issues.  No symptoms related with increased heart rate. Continue carvedilol, amiodarone, apixaban Heart healthy low-sodium diet-salty 6 given Increase physical activity as  tolerated Maintain p.o. hydration   Lower extremity edema-euvolemic today.    Echocardiogram showed hyperdynamic LV function with G3 DD. Continue carvedilol, furosemide Heart healthy low-sodium diet-salty 6 given Increase physical activity as tolerated Elevate lower extremities when active   CKD stage IV-creatinine 1.95 on 09/09/2020.  Baseline creatinine around 1.5-1.9. Follows with PCP   Disposition: Follow-up with Dr. Debara Pickett in 2-3 months.        Medication Adjustments/Labs and Tests Ordered: Current medicines are reviewed at length with the patient today.  Concerns regarding medicines are outlined above.  No orders of the defined types were placed in this encounter.  No orders of the defined types were placed in this encounter.   There are no Patient Instructions on file for this visit.   Signed, Deberah Pelton, NP  10/15/2020 1:00 PM      Notice: This dictation was prepared with Dragon dictation along with smaller phrase technology. Any transcriptional errors that result from this process are unintentional and may not be corrected upon review.  I spent 14 minutes examining this patient, reviewing medications, and using patient centered shared decision making involving her cardiac care.  Prior to her visit I spent greater than 20 minutes reviewing her past medical history,  medications, and prior cardiac tests.

## 2020-10-16 ENCOUNTER — Ambulatory Visit (INDEPENDENT_AMBULATORY_CARE_PROVIDER_SITE_OTHER): Payer: Medicare Other | Admitting: General Practice

## 2020-10-16 ENCOUNTER — Encounter (HOSPITAL_BASED_OUTPATIENT_CLINIC_OR_DEPARTMENT_OTHER): Payer: Self-pay | Admitting: General Practice

## 2020-10-16 ENCOUNTER — Other Ambulatory Visit: Payer: Self-pay

## 2020-10-16 VITALS — BP 124/74 | HR 82 | Ht 65.0 in | Wt 128.4 lb

## 2020-10-16 DIAGNOSIS — I5033 Acute on chronic diastolic (congestive) heart failure: Secondary | ICD-10-CM | POA: Diagnosis not present

## 2020-10-16 DIAGNOSIS — I10 Essential (primary) hypertension: Secondary | ICD-10-CM | POA: Diagnosis not present

## 2020-10-16 DIAGNOSIS — D62 Acute posthemorrhagic anemia: Secondary | ICD-10-CM | POA: Diagnosis not present

## 2020-10-16 DIAGNOSIS — R600 Localized edema: Secondary | ICD-10-CM | POA: Diagnosis not present

## 2020-10-16 DIAGNOSIS — N184 Chronic kidney disease, stage 4 (severe): Secondary | ICD-10-CM | POA: Diagnosis not present

## 2020-10-16 DIAGNOSIS — I4819 Other persistent atrial fibrillation: Secondary | ICD-10-CM

## 2020-10-16 DIAGNOSIS — H3530 Unspecified macular degeneration: Secondary | ICD-10-CM | POA: Diagnosis not present

## 2020-10-16 DIAGNOSIS — I129 Hypertensive chronic kidney disease with stage 1 through stage 4 chronic kidney disease, or unspecified chronic kidney disease: Secondary | ICD-10-CM | POA: Diagnosis not present

## 2020-10-16 DIAGNOSIS — S81001D Unspecified open wound, right knee, subsequent encounter: Secondary | ICD-10-CM | POA: Diagnosis not present

## 2020-10-16 MED ORDER — FUROSEMIDE 20 MG PO TABS
20.0000 mg | ORAL_TABLET | Freq: Every day | ORAL | 3 refills | Status: DC
Start: 2020-10-16 — End: 2021-01-15

## 2020-10-16 NOTE — Patient Instructions (Signed)
Medication Instructions:  DECREASE- Furosemide(Lasix) 20 mg by mouth daily ' *If you need a refill on your cardiac medications before your next appointment, please call your pharmacy*   Lab Work: None Ordered   Testing/Procedures: None Ordered   Follow-Up: At [facility], you and your health needs are our priority.  As part of our continuing mission to provide you with exceptional heart care, we have created designated Provider Care Teams.  These Care Teams include your primary Cardiologist (physician) and Advanced Practice Providers (APPs -  Physician Assistants and Nurse Practitioners) who all work together to provide you with the care you need, when you need it.  We recommend signing up for the patient portal called "MyChart".  Sign up information is provided on this After Visit Summary.  MyChart is used to connect with patients for Virtual Visits (Telemedicine).  Patients are able to view lab/test results, encounter notes, upcoming appointments, etc.  Non-urgent messages can be sent to your provider as well.   To learn more about what you can do with MyChart, go to [email or web address].    Your next appointment:   Keep all upcoming appointment     Other Instructions Increase physical activity  Heart-Healthy Eating Plan Many factors influence your heart (coronary) health, including eating and exercise habits. Coronary risk increases with abnormal blood fat (lipid) levels. Heart-healthy meal planning includes limiting unhealthy fats, increasing healthy fats, and making other diet and lifestyle changes. What is my plan? Your health care provider may recommend that you: Limit your fat intake to _________% or less of your total calories each day. Limit your saturated fat intake to _________% or less of your total calories each day. Limit the amount of cholesterol in your diet to less than _________ mg per day. What are tips for following this plan? Cooking Cook foods using  methods other than frying. Baking, boiling, grilling, and broiling are all good options. Other ways to reduce fat include: Removing the skin from poultry. Removing all visible fats from meats. Steaming vegetables in water or broth. Meal planning  At meals, imagine dividing your plate into fourths: Fill one-half of your plate with vegetables and green salads. Fill one-fourth of your plate with whole grains. Fill one-fourth of your plate with lean protein foods. Eat 4-5 servings of vegetables per day. One serving equals 1 cup raw or cooked vegetable, or 2 cups raw leafy greens. Eat 4-5 servings of fruit per day. One serving equals 1 medium whole fruit,  cup dried fruit,  cup fresh, frozen, or canned fruit, or  cup 100% fruit juice. Eat more foods that contain soluble fiber. Examples include apples, broccoli, carrots, beans, peas, and barley. Aim to get 25-30 g of fiber per day. Increase your consumption of legumes, nuts, and seeds to 4-5 servings per week. One serving of dried beans or legumes equals  cup cooked, 1 serving of nuts is  cup, and 1 serving of seeds equals 1 tablespoon. Fats Choose healthy fats more often. Choose monounsaturated and polyunsaturated fats, such as olive and canola oils, flaxseeds, walnuts, almonds, and seeds. Eat more omega-3 fats. Choose salmon, mackerel, sardines, tuna, flaxseed oil, and ground flaxseeds. Aim to eat fish at least 2 times each week. Check food labels carefully to identify foods with trans fats or high amounts of saturated fat. Limit saturated fats. These are found in animal products, such as meats, butter, and cream. Plant sources of saturated fats include palm oil, palm kernel oil, and coconut oil. Avoid foods with  partially hydrogenated oils in them. These contain trans fats. Examples are stick margarine, some tub margarines, cookies, crackers, and other baked goods. Avoid fried foods. General information Eat more home-cooked food and less  restaurant, buffet, and fast food. Limit or avoid alcohol. Limit foods that are high in starch and sugar. Lose weight if you are overweight. Losing just 5-10% of your body weight can help your overall health and prevent diseases such as diabetes and heart disease. Monitor your salt (sodium) intake, especially if you have high blood pressure. Talk with your health care provider about your sodium intake. Try to incorporate more vegetarian meals weekly. What foods can I eat? Fruits All fresh, canned (in natural juice), or frozen fruits. Vegetables Fresh or frozen vegetables (raw, steamed, roasted, or grilled). Green salads. Grains Most grains. Choose whole wheat and whole grains most of the time. Rice and pasta, including brown rice and pastas made with whole wheat. Meats and other proteins Lean, well-trimmed beef, veal, pork, and lamb. Chicken and [place] without skin. All fish and shellfish. Wild duck, rabbit, pheasant, and venison. Egg whites or low-cholesterol egg substitutes. Dried beans, peas, lentils, and tofu. Seeds and most nuts. Dairy Low-fat or nonfat cheeses, including ricotta and mozzarella. Skim or 1% milk (liquid, powdered, or evaporated). Buttermilk made with low-fat milk. Nonfat or low-fat yogurt. Fats and oils Non-hydrogenated (trans-free) margarines. Vegetable oils, including soybean, sesame, sunflower, olive, peanut, safflower, corn, canola, and cottonseed. Salad dressings or mayonnaise made with a vegetable oil. Beverages Water (mineral or sparkling). Coffee and tea. Diet carbonated beverages. Sweets and desserts Sherbet, gelatin, and fruit ice. Small amounts of dark chocolate. Limit all sweets and desserts. Seasonings and condiments All seasonings and condiments. The items listed above may not be a complete list of foods and beverages you can eat. Contact a dietitian for more options. What foods are not recommended? Fruits Canned fruit in heavy syrup. Fruit in cream or  butter sauce. Fried fruit. Limit coconut. Vegetables Vegetables cooked in cheese, cream, or butter sauce. Fried vegetables. Grains Breads made with saturated or trans fats, oils, or whole milk. Croissants. Sweet rolls. Donuts. High-fat crackers, such as cheese crackers. Meats and other proteins Fatty meats, such as hot dogs, ribs, sausage, bacon, rib-eye roast or steak. High-fat deli meats, such as salami and bologna. Caviar. Domestic duck and goose. Organ meats, such as liver. Dairy Cream, sour cream, cream cheese, and creamed cottage cheese. Whole milk cheeses. Whole or 2% milk (liquid, evaporated, or condensed). Whole buttermilk. Cream sauce or high-fat cheese sauce. Whole-milk yogurt. Fats and oils Meat fat, or shortening. Cocoa butter, hydrogenated oils, palm oil, coconut oil, palm kernel oil. Solid fats and shortenings, including bacon fat, salt pork, lard, and butter. Nondairy cream substitutes. Salad dressings with cheese or sour cream. Beverages Regular sodas and any drinks with added sugar. Sweets and desserts Frosting. Pudding. Cookies. Cakes. Pies. Milk chocolate or white chocolate. Buttered syrups. Full-fat ice cream or ice cream drinks. The items listed above may not be a complete list of foods and beverages to avoid. Contact a dietitian for more information. Summary Heart-healthy meal planning includes limiting unhealthy fats, increasing healthy fats, and making other diet and lifestyle changes. Lose weight if you are overweight. Losing just 5-10% of your body weight can help your overall health and prevent diseases such as diabetes and heart disease. Focus on eating a balance of foods, including fruits and vegetables, low-fat or nonfat dairy, lean protein, nuts and legumes, whole grains, and heart-healthy oils and  fats. This information is not intended to replace advice given to you by your health care provider. Make sure you discuss any questions you have with your health care  provider. Document Revised: 01/30/2017 Document Reviewed: 01/30/2017 Elsevier Patient Education  2022 [employer].

## 2020-10-18 ENCOUNTER — Encounter: Payer: Medicare Other | Admitting: Physician Assistant

## 2020-10-19 ENCOUNTER — Ambulatory Visit (INDEPENDENT_AMBULATORY_CARE_PROVIDER_SITE_OTHER): Payer: Medicare Other

## 2020-10-19 DIAGNOSIS — I4819 Other persistent atrial fibrillation: Secondary | ICD-10-CM

## 2020-10-23 DIAGNOSIS — D62 Acute posthemorrhagic anemia: Secondary | ICD-10-CM | POA: Diagnosis not present

## 2020-10-23 DIAGNOSIS — I129 Hypertensive chronic kidney disease with stage 1 through stage 4 chronic kidney disease, or unspecified chronic kidney disease: Secondary | ICD-10-CM | POA: Diagnosis not present

## 2020-10-23 DIAGNOSIS — H3530 Unspecified macular degeneration: Secondary | ICD-10-CM | POA: Diagnosis not present

## 2020-10-23 DIAGNOSIS — I4819 Other persistent atrial fibrillation: Secondary | ICD-10-CM | POA: Diagnosis not present

## 2020-10-23 DIAGNOSIS — S81001D Unspecified open wound, right knee, subsequent encounter: Secondary | ICD-10-CM | POA: Diagnosis not present

## 2020-10-23 DIAGNOSIS — N184 Chronic kidney disease, stage 4 (severe): Secondary | ICD-10-CM | POA: Diagnosis not present

## 2020-10-23 LAB — CUP PACEART REMOTE DEVICE CHECK
Battery Remaining Longevity: 66 mo
Battery Remaining Percentage: 100 %
Brady Statistic RA Percent Paced: 36 %
Brady Statistic RV Percent Paced: 7 %
Date Time Interrogation Session: 20221014022100
Implantable Lead Implant Date: 20210716
Implantable Lead Implant Date: 20210716
Implantable Lead Location: 753859
Implantable Lead Location: 753860
Implantable Lead Model: 7840
Implantable Lead Model: 7841
Implantable Lead Serial Number: 1017229
Implantable Lead Serial Number: 1090224
Implantable Pulse Generator Implant Date: 20210716
Lead Channel Impedance Value: 636 Ohm
Lead Channel Impedance Value: 761 Ohm
Lead Channel Pacing Threshold Amplitude: 0.9 V
Lead Channel Pacing Threshold Pulse Width: 0.4 ms
Lead Channel Setting Pacing Amplitude: 2 V
Lead Channel Setting Pacing Amplitude: 2.5 V
Lead Channel Setting Pacing Pulse Width: 0.4 ms
Lead Channel Setting Sensing Sensitivity: 2.5 mV
Pulse Gen Serial Number: 547553

## 2020-10-25 ENCOUNTER — Other Ambulatory Visit: Payer: Self-pay

## 2020-10-25 ENCOUNTER — Encounter: Payer: Medicare Other | Admitting: Physician Assistant

## 2020-10-25 DIAGNOSIS — L97812 Non-pressure chronic ulcer of other part of right lower leg with fat layer exposed: Secondary | ICD-10-CM | POA: Diagnosis not present

## 2020-10-25 DIAGNOSIS — I480 Paroxysmal atrial fibrillation: Secondary | ICD-10-CM | POA: Diagnosis not present

## 2020-10-25 DIAGNOSIS — L98492 Non-pressure chronic ulcer of skin of other sites with fat layer exposed: Secondary | ICD-10-CM | POA: Diagnosis not present

## 2020-10-25 DIAGNOSIS — Z7901 Long term (current) use of anticoagulants: Secondary | ICD-10-CM | POA: Diagnosis not present

## 2020-10-25 NOTE — Progress Notes (Addendum)
Allison, Norman ([id number]) Visit Report for 10/25/2020 Arrival Information Details Patient Name: Allison Norman, Allison Norman. Date of Service: 10/25/2020 2:30 PM Medical Record Number: [id number] Patient Account Number: [id number] Date of Birth/Sex: [birth year] (87 y.o. F) Treating Norman: Allison Norman Primary Care Allison Norman: Allison Norman Other Clinician: Referring Allison Norman: Allison Norman Treating Allison Norman/Extender: Allison Norman in Treatment: 4 Visit Information History Since Last Visit Added or deleted any medications: No Patient Arrived: Walker Has Dressing in Place as Prescribed: Yes Arrival Time: 14:46 Pain Present Now: No Accompanied By: caregiver Transfer Assistance: None Patient Identification Verified: Yes Secondary Verification Process Completed: Yes Patient Has Alerts: Yes Patient Alerts: Patient on Blood Thinner Eliquis Electronic Signature(s) Signed: 10/25/2020 2:59:00 PM By: Allison Norman, BSN, Norman, CWS, Allison Norman, BSN Entered By: Allison Norman, BSN, Norman, CWS, Allison on 10/25/2020 14:47:25 Allison Norman ([id number]) -------------------------------------------------------------------------------- Clinic Level of Care Assessment Details Patient Name: Allison Norman, Allison Norman. Date of Service: 10/25/2020 2:30 PM Medical Record Number: [id number] Patient Account Number: [id number] Date of Birth/Sex: 1924-04-05 (87 y.o. F) Treating Norman: Allison Norman Primary Care Neriyah Cercone: Allison Norman Other Clinician: Referring Allison Norman: Allison Norman Treating Aidynn Polendo/Extender: Allison Norman in Treatment: 4 Clinic Level of Care Assessment Items TOOL 4 Quantity Score []  - Use when only an EandM is performed on FOLLOW-UP visit 0 ASSESSMENTS - Nursing Assessment / Reassessment X - Reassessment of Co-morbidities (includes updates in patient status) 1 10 X- 1 5 Reassessment of Adherence to Treatment Plan ASSESSMENTS - Wound and Skin Assessment / Reassessment X - Simple Wound Assessment / Reassessment - one wound 1 5 []  -  0 Complex Wound Assessment / Reassessment - multiple wounds []  - 0 Dermatologic / Skin Assessment (not related to wound area) ASSESSMENTS - Focused Assessment []  - Circumferential Edema Measurements - multi extremities 0 []  - 0 Nutritional Assessment / Counseling / Intervention []  - 0 Lower Extremity Assessment (monofilament, tuning fork, pulses) []  - 0 Peripheral Arterial Disease Assessment (using hand held doppler) ASSESSMENTS - Ostomy and/or Continence Assessment and Care []  - Incontinence Assessment and Management 0 []  - 0 Ostomy Care Assessment and Management (repouching, etc.) PROCESS - Coordination of Care X - Simple Patient / Family Education for ongoing care 1 15 []  - 0 Complex (extensive) Patient / Family Education for ongoing care []  - 0 Staff obtains Programmer, systems, Records, Test Results / Process Orders []  - 0 Staff telephones HHA, Nursing Homes / Clarify orders / etc []  - 0 Routine Transfer to another Facility (non-emergent condition) []  - 0 Routine Hospital Admission (non-emergent condition) []  - 0 New Admissions / Biomedical engineer / Ordering NPWT, Apligraf, etc. []  - 0 Emergency Hospital Admission (emergent condition) X- 1 10 Simple Discharge Coordination []  - 0 Complex (extensive) Discharge Coordination PROCESS - Special Needs []  - Pediatric / Minor Patient Management 0 []  - 0 Isolation Patient Management []  - 0 Hearing / Language / Visual special needs []  - 0 Assessment of Community assistance (transportation, D/C planning, etc.) []  - 0 Additional assistance / Altered mentation []  - 0 Support Surface(s) Assessment (bed, cushion, seat, etc.) INTERVENTIONS - Wound Cleansing / Measurement Birch, Jet C. ([id number]) []  - 0 Simple Wound Cleansing - one wound []  - 0 Complex Wound Cleansing - multiple wounds X- 1 5 Wound Imaging (photographs - any number of wounds) []  - 0 Wound Tracing (instead of photographs) []  - 0 Simple Wound Measurement  - one wound X- 2 5 Complex Wound Measurement - multiple wounds INTERVENTIONS - Wound Dressings []  - Small Wound Dressing one  or multiple wounds 0 []  - 0 Medium Wound Dressing one or multiple wounds []  - 0 Large Wound Dressing one or multiple wounds []  - 0 Application of Medications - topical []  - 0 Application of Medications - injection INTERVENTIONS - Miscellaneous []  - External ear exam 0 []  - 0 Specimen Collection (cultures, biopsies, blood, body fluids, etc.) []  - 0 Specimen(s) / Culture(s) sent or taken to Lab for analysis []  - 0 Patient Transfer (multiple staff / Civil Service fast streamer / Similar devices) []  - 0 Simple Staple / Suture removal (25 or less) []  - 0 Complex Staple / Suture removal (26 or more) []  - 0 Hypo / Hyperglycemic Management (close monitor of Blood Glucose) []  - 0 Ankle / Brachial Index (ABI) - do not check if billed separately X- 1 5 Vital Signs Has the patient been seen at the hospital within the last three years: Yes Total Score: 65 Level Of Care: New/Established - Level 2 Electronic Signature(s) Signed: 10/25/2020 5:41:12 PM By: Allison Norman, BSN, Norman, CWS, Allison Norman, BSN Entered By: Allison Norman, BSN, Norman, CWS, Allison on 10/25/2020 15:11:45 Allison Norman ([id number]) -------------------------------------------------------------------------------- Encounter Discharge Information Details Patient Name: Allison Norman. Date of Service: 10/25/2020 2:30 PM Medical Record Number: [id number] Patient Account Number: [id number] Date of Birth/Sex: 11-Jan-1924 (87 y.o. F) Treating Norman: Allison Norman Primary Care Allison Norman: Allison Norman Other Clinician: Referring Allison Norman: Allison Norman Treating Allison Norman/Extender: Allison Norman in Treatment: 4 Encounter Discharge Information Items Discharge Condition: Stable Ambulatory Status: Walker Discharge Destination: Home Transportation: Private Auto Accompanied By: caregiver Schedule Follow-up Appointment: Yes Clinical Summary of  Care: Electronic Signature(s) Signed: 10/25/2020 5:41:12 PM By: Allison Norman, BSN, Norman, CWS, Allison Norman, BSN Entered By: Allison Norman, BSN, Norman, CWS, Allison on 10/25/2020 15:15:23 Allison Norman ([id number]) -------------------------------------------------------------------------------- Lower Extremity Assessment Details Patient Name: Allison Norman, Allison Norman. Date of Service: 10/25/2020 2:30 PM Medical Record Number: [id number] Patient Account Number: [id number] Date of Birth/Sex: 1924/10/17 (87 y.o. F) Treating Norman: Allison Norman Primary Care Tiandre Teall: Allison Norman Other Clinician: Referring Mariadel Mruk: Allison Norman Treating Leontae Bostock/Extender: Allison Norman in Treatment: 4 Edema Assessment Assessed: [Left: No] [Right: Yes] [Left: Edema] [Right: :] Vascular Assessment Pulses: Dorsalis Pedis Palpable: [Right:Yes] Electronic Signature(s) Signed: 10/25/2020 2:59:00 PM By: Allison Norman, BSN, Norman, CWS, Allison Norman, BSN Entered By: Allison Norman, BSN, Norman, CWS, Allison on 10/25/2020 14:56:47 Allison Norman ([id number]) -------------------------------------------------------------------------------- Multi Wound Chart Details Patient Name: Allison Norman, Allison Norman. Date of Service: 10/25/2020 2:30 PM Medical Record Number: [id number] Patient Account Number: [id number] Date of Birth/Sex: August 31, 1924 (87 y.o. F) Treating Norman: Allison Norman Primary Care Ginia Rudell: Allison Norman Other Clinician: Referring Daymeon Fischman: Allison Norman Treating Carolyn Sylvia/Extender: Allison Norman in Treatment: 4 Vital Signs Height(in): 65 Pulse(bpm): 101 Weight(lbs): 127 Blood Pressure(mmHg): 135/90 Body Mass Index(BMI): 21 Temperature(F): 98.3 Respiratory Rate(breaths/min): 16 Photos: [N/A:N/A] Wound Location: Right, Anterior Knee Right, Proximal, Anterior Lower Leg N/A Wounding Event: Laceration Shear/Friction N/A Primary Etiology: Trauma, Other Abrasion N/A Comorbid History: Anemia, Arrhythmia, Hypertension, Anemia, Arrhythmia, Hypertension, N/A Osteoarthritis  Osteoarthritis Date Acquired: 08/27/2020 10/09/2020 N/A Weeks of Treatment: 4 2 N/A Wound Status: Open Open N/A Measurements L x W x D (cm) 0.1x1x0.1 0.7x0.5x0.1 N/A Area (cm) : 0.079 0.275 N/A Volume (cm) : 0.008 0.027 N/A % Reduction in Area: 99.40% 85.40% N/A % Reduction in Volume: 99.80% 85.60% N/A Classification: Full Thickness Without Exposed Full Thickness Without Exposed N/A Support Structures Support Structures Exudate Amount: None Present Medium N/A Exudate Type: N/A Serosanguineous N/A Exudate Color: N/A red, brown N/A Granulation Amount: Large (67-100%) None  Present (0%) N/A Granulation Quality: Red N/A N/A Necrotic Amount: None Present (0%) Large (67-100%) N/A Necrotic Tissue: N/A Eschar N/A Exposed Structures: Fat Layer (Subcutaneous Tissue): Fat Layer (Subcutaneous Tissue): N/A Yes Yes Fascia: No Fascia: No Tendon: No Tendon: No Muscle: No Muscle: No Joint: No Joint: No Bone: No Bone: No Epithelialization: Large (67-100%) Large (67-100%) N/A Treatment Notes Electronic Signature(s) Signed: 10/25/2020 2:58:44 PM By: Allison Norman, BSN, Norman, CWS, Allison Norman, BSN Entered By: Allison Norman, BSN, Norman, CWS, Allison on 10/25/2020 14:58:44 Allison Norman ([id number]) -------------------------------------------------------------------------------- Multi-Disciplinary Care Plan Details Patient Name: Allison Norman, Allison Norman. Date of Service: 10/25/2020 2:30 PM Medical Record Number: [id number] Patient Account Number: [id number] Date of Birth/Sex: September 04, 1924 (87 y.o. F) Treating Norman: Allison Norman Primary Care Tinea Nobile: Allison Norman Other Clinician: Referring Yarethzy Croak: Allison Norman Treating Asja Frommer/Extender: Allison Norman in Treatment: 4 Active Inactive Electronic Signature(s) Signed: 10/25/2020 5:41:12 PM By: Allison Norman, BSN, Norman, CWS, Allison Norman, BSN Previous Signature: 10/25/2020 2:58:28 PM Version By: Allison Norman, BSN, Norman, CWS, Allison Norman, BSN Entered By: Allison Norman, BSN, Norman, CWS, Allison on 10/25/2020  15:10:12 Allison Norman ([id number]) -------------------------------------------------------------------------------- Pain Assessment Details Patient Name: Allison Norman, Allison Norman. Date of Service: 10/25/2020 2:30 PM Medical Record Number: [id number] Patient Account Number: [id number] Date of Birth/Sex: 1925-01-02 (87 y.o. F) Treating Norman: Allison Norman Primary Care Holley Kocurek: Allison Norman Other Clinician: Referring Railee Bonillas: Allison Norman Treating Reverie Vaquera/Extender: Allison Norman in Treatment: 4 Active Problems Location of Pain Severity and Description of Pain Patient Has Paino No Site Locations Pain Management and Medication Current Pain Management: Notes Patient denies pain at this time. Electronic Signature(s) Signed: 10/25/2020 2:59:00 PM By: Allison Norman, BSN, Norman, CWS, Allison Norman, BSN Entered By: Allison Norman, BSN, Norman, CWS, Allison on 10/25/2020 14:50:16 Allison Norman ([id number]) -------------------------------------------------------------------------------- Patient/Caregiver Education Details Patient Name: Allison Norman, Allison Norman. Date of Service: 10/25/2020 2:30 PM Medical Record Number: [id number] Patient Account Number: [id number] Date of Birth/Gender: 1924-11-15 (86 y.o. F) Treating Norman: Allison Norman Primary Care Physician: Allison Norman Other Clinician: Referring Physician: Lona Norman Treating Physician/Extender: Allison Norman in Treatment: 4 Education Assessment Education Provided To: Patient and Caregiver Education Topics Provided Notes Cover healed area with ABD pad secured with tubigrip for 2 weeks for protection of newly healed area. Electronic Signature(s) Signed: 10/25/2020 5:41:12 PM By: Allison Norman, BSN, Norman, CWS, Allison Norman, BSN Entered By: Allison Norman, BSN, Norman, CWS, Allison on 10/25/2020 15:13:04 Allison Norman ([id number]) -------------------------------------------------------------------------------- Wound Assessment Details Patient Name: Allison Norman, Allison Norman. Date of Service: 10/25/2020 2:30  PM Medical Record Number: [id number] Patient Account Number: [id number] Date of Birth/Sex: 09-16-1924 (87 y.o. F) Treating Norman: Allison Norman Primary Care Annie Saephan: Allison Norman Other Clinician: Referring Diontay Rosencrans: Allison Norman Treating Myiah Petkus/Extender: Jeri Cos Weeks in Treatment: 4 Wound Status Wound Number: 1 Primary Etiology: Trauma, Other Wound Location: Right, Anterior Knee Wound Status: Healed - Epithelialized Wounding Event: Laceration Comorbid History: Anemia, Arrhythmia, Hypertension, Osteoarthritis Date Acquired: 08/27/2020 Weeks Of Treatment: 4 Clustered Wound: No Photos Wound Measurements Length: (cm) 0 Width: (cm) 0 Depth: (cm) 0 Area: (cm) 0 Volume: (cm) 0 % Reduction in Area: 100% % Reduction in Volume: 100% Epithelialization: Large (67-100%) Wound Description Classification: Full Thickness Without Exposed Support Structure Exudate Amount: None Present s Foul Odor After Cleansing: No Slough/Fibrino No Wound Bed Granulation Amount: Large (67-100%) Exposed Structure Granulation Quality: Red Fascia Exposed: No Necrotic Amount: None Present (0%) Fat Layer (Subcutaneous Tissue) Exposed: Yes Tendon Exposed: No Muscle Exposed: No Joint Exposed: No Bone Exposed: No Electronic Signature(s) Signed: 10/25/2020 5:41:12 PM By: Allison Norman,  BSN, Norman, CWS, Leisure centre manager, BSN Previous Signature: 10/25/2020 2:59:00 PM Version By: Allison Norman, BSN, Norman, CWS, Allison Norman, BSN Entered By: Allison Norman, BSN, Norman, CWS, Allison on 10/25/2020 15:06:09 Allison Norman ([id number]) -------------------------------------------------------------------------------- Wound Assessment Details Patient Name: Allison Norman, Allison Norman. Date of Service: 10/25/2020 2:30 PM Medical Record Number: [id number] Patient Account Number: [id number] Date of Birth/Sex: Apr 19, 1924 (87 y.o. F) Treating Norman: Allison Norman Primary Care Shondale Quinley: Allison Norman Other Clinician: Referring Emersynn Deatley: Allison Norman Treating Haddon Fyfe/Extender: Jeri Cos Weeks in Treatment: 4 Wound Status Wound Number: 2 Primary Etiology: Abrasion Wound Location: Right, Proximal, Anterior Lower Leg Wound Status: Healed - Epithelialized Wounding Event: Shear/Friction Notes: stated occurred when bandage/tape removed Date Acquired: 10/09/2020 Comorbid History: Anemia, Arrhythmia, Hypertension, Osteoarthritis Weeks Of Treatment: 2 Clustered Wound: No Photos Wound Measurements Length: (cm) 0 Width: (cm) 0 Depth: (cm) 0 Area: (cm) Volume: (cm) % Reduction in Area: 100% % Reduction in Volume: 100% Epithelialization: Large (67-100%) 0 0 Wound Description Classification: Full Thickness Without Exposed Support Structu Exudate Amount: Medium Exudate Type: Serosanguineous Exudate Color: red, brown res Foul Odor After Cleansing: No Slough/Fibrino Yes Wound Bed Granulation Amount: None Present (0%) Exposed Structure Necrotic Amount: Large (67-100%) Fascia Exposed: No Necrotic Quality: Eschar Fat Layer (Subcutaneous Tissue) Exposed: Yes Tendon Exposed: No Muscle Exposed: No Joint Exposed: No Bone Exposed: No Electronic Signature(s) Signed: 10/25/2020 5:41:12 PM By: Allison Norman, BSN, Norman, CWS, Allison Norman, BSN Previous Signature: 10/25/2020 2:59:00 PM Version By: Allison Norman, BSN, Norman, CWS, Allison Norman, BSN Entered By: Allison Norman, BSN, Norman, CWS, Allison on 10/25/2020 15:06:09 Allison Norman ([id number]) -------------------------------------------------------------------------------- Vitals Details Patient Name: Allison Norman. Date of Service: 10/25/2020 2:30 PM Medical Record Number: [id number] Patient Account Number: [id number] Date of Birth/Sex: [birth year] (87 y.o. F) Treating Norman: Allison Norman Primary Care Numan Zylstra: Allison Norman Other Clinician: Referring Corvette Orser: Allison Norman Treating Knight Oelkers/Extender: Allison Norman in Treatment: 4 Vital Signs Time Taken: 14:49 Temperature (F): 98.3 Height (in): 65 Pulse (bpm): 101 Weight (lbs): 127 Respiratory Rate  (breaths/min): 16 Body Mass Index (BMI): 21.1 Blood Pressure (mmHg): 135/90 Reference Range: 80 - 120 mg / dl Electronic Signature(s) Signed: 10/25/2020 2:59:00 PM By: Allison Norman, BSN, Norman, CWS, Allison Norman, BSN Entered By: Allison Norman, BSN, Norman, CWS, Allison on 10/25/2020 14:50:00

## 2020-10-25 NOTE — Progress Notes (Addendum)
ADJOA, ALTHOUSE ([id number]) Visit Report for 10/25/2020 Chief Complaint Document Details Patient Name: Allison Norman, Allison Norman. Date of Service: 10/25/2020 2:30 PM Medical Record Number: [id number] Patient Account Number: [id number] Date of Birth/Sex: 08-30-1924 (87 y.o. F) Treating RN: Cornell Barman Primary Care Provider: Lona Kettle Other Clinician: Referring Provider: Lona Kettle Treating Provider/Extender: Skipper Cliche in Treatment: 4 Information Obtained from: Patient Chief Complaint Right knee ulcer/laceration Electronic Signature(s) Signed: 10/25/2020 2:48:01 PM By: Worthy Keeler PA-C Entered By: Worthy Keeler on 10/25/2020 14:48:01 Allison Norman ([id number]) -------------------------------------------------------------------------------- HPI Details Patient Name: Allison Norman Date of Service: 10/25/2020 2:30 PM Medical Record Number: [id number] Patient Account Number: [id number] Date of Birth/Sex: [birth year] (87 y.o. F) Treating RN: Cornell Barman Primary Care Provider: Lona Kettle Other Clinician: Referring Provider: Lona Kettle Treating Provider/Extender: Skipper Cliche in Treatment: 4 History of Present Illness HPI Description: 09/27/2020 upon evaluation today patient appears to be doing somewhat poorly in regard to significant skin tear of her right knee or just below the knee area. This actually occurred on 08/27/2020 and unfortunately being that she is on Eliquis she had a tremendous amount of bleeding. In the ER it was consulted for the surgeon to come down to take a look at this but they advised they did not need to and it just need to be sutured up in the ER. They did their best to get this closed and then subsequently a couple weeks later took the sutures out and applied Steri-Strips. Unfortunately this just has not completely sealed up and continues to bleed some but also has some necrotic tissue noted on the surface of the wound. She does have a  history of paroxysmal atrial fibrillation and for that reason is on long-term anticoagulant, Eliquis, for this. 10/04/2020 upon evaluation today patient appears to be doing better in regard to her leg ulcer today. Fortunately there does not appear to be any evidence of active infection which is great news and overall I am extremely pleased with where we stand. I do not see any evidence whatsoever of infection and I think she is making great progress. 10/11/2020 upon evaluation today patient's right knee actually appears to be doing much better. With that being said it appears there was a dressing other than the silicone dressing that was placed on her knee and subsequently caused a skin tear below. This fortunately is really not too bad at all and seems to be healing nicely with the [facility] along nonetheless we do to make sure that silicone dressings only are used for this patient. Anything else is too hard on her skin and just not can be appropriate to be honest. 10/25/2020 upon evaluation today patient appears to be doing well with regard to her wounds on the knee. Fortunately this appears to be completely healed which is great news. Some of this is brand-new Skains I think protection is still needed but overall I do not see anything that remains open at this point. Electronic Signature(s) Signed: 10/25/2020 3:09:41 PM By: Worthy Keeler PA-C Entered By: Worthy Keeler on 10/25/2020 15:09:41 Allison Norman ([id number]) -------------------------------------------------------------------------------- Physical Exam Details Patient Name: Allison Norman, Allison Norman. Date of Service: 10/25/2020 2:30 PM Medical Record Number: [id number] Patient Account Number: [id number] Date of Birth/Sex: 06-08-1924 (87 y.o. F) Treating RN: Cornell Barman Primary Care Provider: Lona Kettle Other Clinician: Referring Provider: Lona Kettle Treating Provider/Extender: Skipper Cliche in Treatment:  4 Constitutional Well-nourished and well-hydrated in no acute distress. Respiratory normal breathing  without difficulty. Psychiatric this patient is able to make decisions and demonstrates good insight into disease process. Alert and Oriented x 3. pleasant and cooperative. Notes Patient's wound showed signs of complete epithelization and overall I think they have done quite well. I do not see any signs of infection which is great news and I think that overall the patient has made excellent progress and I am extremely pleased in this regard. Electronic Signature(s) Signed: 10/25/2020 3:09:57 PM By: Worthy Keeler PA-C Entered By: Worthy Keeler on 10/25/2020 15:09:56 Allison Norman ([id number]) -------------------------------------------------------------------------------- Physician Orders Details Patient Name: Allison Norman Date of Service: 10/25/2020 2:30 PM Medical Record Number: [id number] Patient Account Number: [id number] Date of Birth/Sex: 1924/09/27 (87 y.o. F) Treating RN: Cornell Barman Primary Care Provider: Lona Kettle Other Clinician: Referring Provider: Lona Kettle Treating Provider/Extender: Skipper Cliche in Treatment: 4 Verbal / Phone Orders: No Diagnosis Coding ICD-10 Coding Code Description H96.222L Laceration without foreign body, right knee, initial encounter L98.492 Non-pressure chronic ulcer of skin of other sites with fat layer exposed I48.0 Paroxysmal atrial fibrillation Z79.01 Long term (current) use of anticoagulants Discharge From [facility] Services o Discharge from [facility] Treatment Complete Electronic Signature(s) Signed: 10/25/2020 5:41:12 PM By: Gretta Cool, BSN, RN, CWS, Kim RN, BSN Signed: 10/26/2020 4:28:58 PM By: Worthy Keeler PA-C Entered By: Gretta Cool, BSN, RN, CWS, Kim on 10/25/2020 15:11:13 Allison Norman ([id number]) -------------------------------------------------------------------------------- Problem List Details Patient  Name: Allison Norman, Allison Norman. Date of Service: 10/25/2020 2:30 PM Medical Record Number: [id number] Patient Account Number: [id number] Date of Birth/Sex: [birth year] (87 y.o. F) Treating RN: Cornell Barman Primary Care Provider: Lona Kettle Other Clinician: Referring Provider: Lona Kettle Treating Provider/Extender: Skipper Cliche in Treatment: 4 Active Problems ICD-10 Encounter Code Description Active Date MDM Diagnosis S81.011A Laceration without foreign body, right knee, initial encounter 09/27/2020 No Yes L98.492 Non-pressure chronic ulcer of skin of other sites with fat layer exposed 09/27/2020 No Yes I48.0 Paroxysmal atrial fibrillation 09/27/2020 No Yes Z79.01 Long term (current) use of anticoagulants 09/27/2020 No Yes Inactive Problems Resolved Problems Electronic Signature(s) Signed: 10/25/2020 2:47:56 PM By: Worthy Keeler PA-C Entered By: Worthy Keeler on 10/25/2020 14:47:56 Allison Norman, Allison Norman ([id number]) -------------------------------------------------------------------------------- Progress Note Details Patient Name: Allison Norman. Date of Service: 10/25/2020 2:30 PM Medical Record Number: [id number] Patient Account Number: [id number] Date of Birth/Sex: 05-Jan-1925 (87 y.o. F) Treating RN: Cornell Barman Primary Care Provider: Lona Kettle Other Clinician: Referring Provider: Lona Kettle Treating Provider/Extender: Skipper Cliche in Treatment: 4 Subjective Chief Complaint Information obtained from Patient Right knee ulcer/laceration History of Present Illness (HPI) 09/27/2020 upon evaluation today patient appears to be doing somewhat poorly in regard to significant skin tear of her right knee or just below the knee area. This actually occurred on 08/27/2020 and unfortunately being that she is on Eliquis she had a tremendous amount of bleeding. In the ER it was consulted for the surgeon to come down to take a look at this but they advised they did not need to and it  just need to be sutured up in the ER. They did their best to get this closed and then subsequently a couple weeks later took the sutures out and applied Steri-Strips. Unfortunately this just has not completely sealed up and continues to bleed some but also has some necrotic tissue noted on the surface of the wound. She does have a history of paroxysmal atrial fibrillation and for that reason is on long-term anticoagulant, Eliquis, for this. 10/04/2020  upon evaluation today patient appears to be doing better in regard to her leg ulcer today. Fortunately there does not appear to be any evidence of active infection which is great news and overall I am extremely pleased with where we stand. I do not see any evidence whatsoever of infection and I think she is making great progress. 10/11/2020 upon evaluation today patient's right knee actually appears to be doing much better. With that being said it appears there was a dressing other than the silicone dressing that was placed on her knee and subsequently caused a skin tear below. This fortunately is really not too bad at all and seems to be healing nicely with the [facility] along nonetheless we do to make sure that silicone dressings only are used for this patient. Anything else is too hard on her skin and just not can be appropriate to be honest. 10/25/2020 upon evaluation today patient appears to be doing well with regard to her wounds on the knee. Fortunately this appears to be completely healed which is great news. Some of this is brand-new Skains I think protection is still needed but overall I do not see anything that remains open at this point. Objective Constitutional Well-nourished and well-hydrated in no acute distress. Vitals Time Taken: 2:49 PM, Height: 65 in, Weight: 127 lbs, BMI: 21.1, Temperature: 98.3 F, Pulse: 101 bpm, Respiratory Rate: 16 breaths/min, Blood Pressure: 135/90 mmHg. Respiratory normal breathing without  difficulty. Psychiatric this patient is able to make decisions and demonstrates good insight into disease process. Alert and Oriented x 3. pleasant and cooperative. General Notes: Patient's wound showed signs of complete epithelization and overall I think they have done quite well. I do not see any signs of infection which is great news and I think that overall the patient has made excellent progress and I am extremely pleased in this regard. Integumentary (Hair, Skin) Wound #1 status is Healed - Epithelialized. Original cause of wound was Laceration. The date acquired was: 08/27/2020. The wound has been in treatment 4 weeks. The wound is located on the Right,Anterior Knee. The wound measures 0cm length x 0cm width x 0cm depth; 0cm^2 area and 0cm^3 volume. There is Fat Layer (Subcutaneous Tissue) exposed. There is a none present amount of drainage noted. There is large (67- 100%) red granulation within the wound bed. There is no necrotic tissue within the wound bed. Wound #2 status is Healed - Epithelialized. Original cause of wound was Shear/Friction. The date acquired was: 10/09/2020. The wound has been in treatment 2 weeks. The wound is located on the Right,Proximal,Anterior Lower Leg. The wound measures 0cm length x 0cm width x 0cm depth; 0cm^2 area and 0cm^3 volume. There is Fat Layer (Subcutaneous Tissue) exposed. There is a medium amount of serosanguineous drainage noted. There is no granulation within the wound bed. There is a large (67-100%) amount of necrotic tissue within the wound bed Allison Norman, Allison C. ([id number]) including Eschar. Assessment Active Problems ICD-10 Laceration without foreign body, right knee, initial encounter Non-pressure chronic ulcer of skin of other sites with fat layer exposed Paroxysmal atrial fibrillation Long term (current) use of anticoagulants Plan 1. Would recommend that we going continue with the wound care measures as before and the patient is in  agreement the plan. This includes the use of the protective dressing with regard to an ABD pad and then the stretch not to hold in place I think that is doing a good job. 2. I am also can recommend that we  have the patient continue to elevate her leg is much as possible monitor for any signs of worsening or infection though right now I do not see anything draining I think she actually is completely healed based on what I am seeing. We will see her back for follow-up visit as needed Electronic Signature(s) Signed: 10/25/2020 3:10:32 PM By: Worthy Keeler PA-C Entered By: Worthy Keeler on 10/25/2020 15:10:32 Allison Norman, Allison Norman ([id number]) -------------------------------------------------------------------------------- SuperBill Details Patient Name: Allison Norman Date of Service: 10/25/2020 Medical Record Number: [id number] Patient Account Number: [id number] Date of Birth/Sex: 11/26/1924 (87 y.o. F) Treating RN: Cornell Barman Primary Care Provider: Lona Kettle Other Clinician: Referring Provider: Lona Kettle Treating Provider/Extender: Skipper Cliche in Treatment: 4 Diagnosis Coding ICD-10 Codes Code Description [phone] Laceration without foreign body, right knee, initial encounter L98.492 Non-pressure chronic ulcer of skin of other sites with fat layer exposed I48.0 Paroxysmal atrial fibrillation Z79.01 Long term (current) use of anticoagulants Physician Procedures CPT4 Code: [id number] Description: 99213 - WC PHYS LEVEL 3 - EST PT Modifier: Quantity: 1 CPT4 Code: Description: ICD-10 Diagnosis Description S81.011A Laceration without foreign body, right knee, initial encounter L98.492 Non-pressure chronic ulcer of skin of other sites with fat layer expos I48.0 Paroxysmal atrial fibrillation Z79.01 Long term  (current) use of anticoagulants Modifier: ed Quantity: Electronic Signature(s) Signed: 10/25/2020 3:10:45 PM By: Worthy Keeler PA-C Entered By: Worthy Keeler on  10/25/2020 15:10:44

## 2020-10-26 DIAGNOSIS — D62 Acute posthemorrhagic anemia: Secondary | ICD-10-CM | POA: Diagnosis not present

## 2020-10-26 DIAGNOSIS — I4819 Other persistent atrial fibrillation: Secondary | ICD-10-CM | POA: Diagnosis not present

## 2020-10-26 DIAGNOSIS — I129 Hypertensive chronic kidney disease with stage 1 through stage 4 chronic kidney disease, or unspecified chronic kidney disease: Secondary | ICD-10-CM | POA: Diagnosis not present

## 2020-10-26 DIAGNOSIS — S81001D Unspecified open wound, right knee, subsequent encounter: Secondary | ICD-10-CM | POA: Diagnosis not present

## 2020-10-26 DIAGNOSIS — H3530 Unspecified macular degeneration: Secondary | ICD-10-CM | POA: Diagnosis not present

## 2020-10-26 DIAGNOSIS — N184 Chronic kidney disease, stage 4 (severe): Secondary | ICD-10-CM | POA: Diagnosis not present

## 2020-10-26 NOTE — Progress Notes (Signed)
Remote pacemaker transmission.   

## 2020-10-29 DIAGNOSIS — R338 Other retention of urine: Secondary | ICD-10-CM | POA: Diagnosis not present

## 2020-10-30 DIAGNOSIS — S81001D Unspecified open wound, right knee, subsequent encounter: Secondary | ICD-10-CM | POA: Diagnosis not present

## 2020-10-30 DIAGNOSIS — I129 Hypertensive chronic kidney disease with stage 1 through stage 4 chronic kidney disease, or unspecified chronic kidney disease: Secondary | ICD-10-CM | POA: Diagnosis not present

## 2020-10-30 DIAGNOSIS — N184 Chronic kidney disease, stage 4 (severe): Secondary | ICD-10-CM | POA: Diagnosis not present

## 2020-10-30 DIAGNOSIS — D62 Acute posthemorrhagic anemia: Secondary | ICD-10-CM | POA: Diagnosis not present

## 2020-10-30 DIAGNOSIS — H3530 Unspecified macular degeneration: Secondary | ICD-10-CM | POA: Diagnosis not present

## 2020-10-30 DIAGNOSIS — I4819 Other persistent atrial fibrillation: Secondary | ICD-10-CM | POA: Diagnosis not present

## 2020-10-31 DIAGNOSIS — N184 Chronic kidney disease, stage 4 (severe): Secondary | ICD-10-CM | POA: Diagnosis not present

## 2020-10-31 DIAGNOSIS — D62 Acute posthemorrhagic anemia: Secondary | ICD-10-CM | POA: Diagnosis not present

## 2020-10-31 DIAGNOSIS — I129 Hypertensive chronic kidney disease with stage 1 through stage 4 chronic kidney disease, or unspecified chronic kidney disease: Secondary | ICD-10-CM | POA: Diagnosis not present

## 2020-10-31 DIAGNOSIS — H3530 Unspecified macular degeneration: Secondary | ICD-10-CM | POA: Diagnosis not present

## 2020-10-31 DIAGNOSIS — S81001D Unspecified open wound, right knee, subsequent encounter: Secondary | ICD-10-CM | POA: Diagnosis not present

## 2020-10-31 DIAGNOSIS — I4819 Other persistent atrial fibrillation: Secondary | ICD-10-CM | POA: Diagnosis not present

## 2020-11-01 ENCOUNTER — Encounter: Payer: Medicare Other | Admitting: Physician Assistant

## 2020-11-05 DIAGNOSIS — R338 Other retention of urine: Secondary | ICD-10-CM | POA: Diagnosis not present

## 2020-11-06 ENCOUNTER — Other Ambulatory Visit: Payer: Self-pay

## 2020-11-06 ENCOUNTER — Encounter (INDEPENDENT_AMBULATORY_CARE_PROVIDER_SITE_OTHER): Payer: Medicare Other | Admitting: Ophthalmology

## 2020-11-06 ENCOUNTER — Encounter (INDEPENDENT_AMBULATORY_CARE_PROVIDER_SITE_OTHER): Payer: Self-pay | Admitting: Ophthalmology

## 2020-11-06 ENCOUNTER — Ambulatory Visit (INDEPENDENT_AMBULATORY_CARE_PROVIDER_SITE_OTHER): Payer: Medicare Other | Admitting: Ophthalmology

## 2020-11-06 DIAGNOSIS — H353124 Nonexudative age-related macular degeneration, left eye, advanced atrophic with subfoveal involvement: Secondary | ICD-10-CM

## 2020-11-06 DIAGNOSIS — H353211 Exudative age-related macular degeneration, right eye, with active choroidal neovascularization: Secondary | ICD-10-CM | POA: Diagnosis not present

## 2020-11-06 DIAGNOSIS — H353113 Nonexudative age-related macular degeneration, right eye, advanced atrophic without subfoveal involvement: Secondary | ICD-10-CM | POA: Diagnosis not present

## 2020-11-06 MED ORDER — AFLIBERCEPT 2MG/0.05ML IZ SOLN FOR KALEIDOSCOPE
2.0000 mg | INTRAVITREAL | Status: AC | PRN
  Administered 2020-11-06: 2 mg via INTRAVITREAL

## 2020-11-06 NOTE — Assessment & Plan Note (Signed)
Advancing atrophy visible clinically

## 2020-11-06 NOTE — Assessment & Plan Note (Signed)
The nature of wet macular degeneration was discussed with the patient.  Forms of therapy reviewed include the use of Anti-VEGF medications injected painlessly into the eye, as well as other possible treatment modalities, including thermal laser therapy. Fellow eye involvement and risks were discussed with the patient. Upon the finding of wet age related macular degeneration, treatment will be offered. The treatment regimen is on a treat as needed basis with the intent to treat if necessary and extend interval of exams when possible. On average 1 out of 6 patients do not need lifetime therapy. However, the risk of recurrent disease is high for a lifetime.  Initially monthly, then periodic, examinations and evaluations will determine whether the next treatment is required on the day of the examination.  OD with subfoveal pigment epithelial detachment slight thickening might in fact be worsening of CME from wet AMD at 12-week interval, Repeat injection intravitreal Eylea today and examination next in 8 weeks,

## 2020-11-06 NOTE — Assessment & Plan Note (Signed)
Advanced OS by OCT

## 2020-11-06 NOTE — Progress Notes (Signed)
11/06/2020     CHIEF COMPLAINT Patient presents for  Chief Complaint  Patient presents with   Retina Follow Up      HISTORY OF PRESENT ILLNESS: Allison Norman is a 87 y.o. female who presents to the clinic today for:   HPI     Retina Follow Up   Patient presents with  Wet AMD.  In right eye.  This started 12 weeks ago.  Duration of 12 weeks.  Since onset it is gradually worsening.      Last edited by Reather Littler, COA on 11/06/2020  2:43 PM.      Referring physician: [name], MD [place],  [place] [address]  HISTORICAL INFORMATION:   Selected notes from the MEDICAL RECORD NUMBER       CURRENT MEDICATIONS: Current Outpatient Medications (Ophthalmic Drugs)  Medication Sig   Aflibercept (EYLEA) 2 MG/0.05ML SOLN 2 mg by Intravitreal route every 3 (three) months. Inject in R eye every 11 weeks per Retina specialist for Macular Degenration   No current facility-administered medications for this visit. (Ophthalmic Drugs)   Current Outpatient Medications (Other)  Medication Sig   acetaminophen (TYLENOL) 500 MG tablet Take 1,000 mg by mouth every 6 (six) hours as needed for moderate pain or headache.   amiodarone (PACERONE) 200 MG tablet Take 0.5 tablets (100 mg total) by mouth daily.   B Complex Vitamins (VITAMIN-B COMPLEX PO) Take 1 tablet by mouth daily.   carvedilol (COREG) 12.5 MG tablet Take 1 tablet (12.5 mg total) by mouth 2 (two) times daily with a meal.   cephALEXin (KEFLEX) 500 MG capsule Take 1 capsule (500 mg total) by mouth 2 (two) times daily.   cloNIDine (CATAPRES) 0.1 MG tablet Take 2 tablets (0.2 mg total) by mouth 2 (two) times daily.   diazepam (VALIUM) 2 MG tablet Take 2 mg by mouth at bedtime as needed for anxiety (for sleep).   ELIQUIS 2.5 MG TABS tablet Take 1 tablet by mouth twice daily   ferrous sulfate 325 (65 FE) MG tablet Take 1 tablet (325 mg total) by mouth daily.   furosemide (LASIX) 20 MG tablet Take 1 tablet  (20 mg total) by mouth daily.   Multiple Vitamins-Minerals (PRESERVISION AREDS 2) CAPS Take 1 capsule by mouth daily.   pantoprazole (PROTONIX) 40 MG tablet Take 1 tablet (40 mg total) by mouth daily.   polyethylene glycol (MIRALAX / GLYCOLAX) 17 g packet Take 17 g by mouth daily.   predniSONE (DELTASONE) 5 MG tablet Take 5 mg by mouth daily as needed (pain).   traMADol (ULTRAM) 50 MG tablet Take 0.5 tablets (25 mg total) by mouth every 6 (six) hours as needed for moderate pain.   No current facility-administered medications for this visit. (Other)      REVIEW OF SYSTEMS:    ALLERGIES Allergies  Allergen Reactions   Codeine Itching   Penicillins Itching, Rash and Other (See Comments)    Has patient had a PCN reaction causing immediate rash, facial/tongue/throat swelling, SOB or lightheadedness with hypotension: Yes Has patient had a PCN reaction causing severe rash involving mucus membranes or skin necrosis: No Has patient had a PCN reaction that required hospitalization: No Has patient had a PCN reaction occurring within the last 10 years: No If all of the above answers are "NO", then may proceed with Cephalosporin use.   Doxazosin     Heavy feeling in chest, congestion, wheezing   Doxycycline Other (See Comments)  Cause chest tightness   Doxycycline Hyclate Other (See Comments)   Hydralazine     Felt bad and could hear pulse in head     PAST MEDICAL HISTORY Past Medical History:  Diagnosis Date   Atrial fibrillation ([facility]) 2017   a. s/p DCCV in 10/2015  b. recurrent in 08/2016 --> rate-control pursued.    Cancer ([facility])    Breast   CKD (chronic kidney disease)    GCA (giant cell arteritis) (HCC)    Hordeolum internum left lower eyelid 06/28/2019   Hypertension    Macular degeneration    Osteoporosis    PMR (polymyalgia rheumatica) (HCC)    Resistant hypertension 03/15/2019   Shortness of breath 03/15/2019   Skin cancer    Past Surgical History:  Procedure Laterality  Date   CARDIOVERSION N/A 10/18/2015   Procedure: CARDIOVERSION;  Surgeon: Jerline Pain, MD;  Location: [facility];  Service: Cardiovascular;  Laterality: N/A;   CARDIOVERSION N/A 02/20/2017   Procedure: CARDIOVERSION;  Surgeon: Pixie Casino, MD;  Location: [facility];  Service: Cardiovascular;  Laterality: N/A;   CARDIOVERSION N/A 06/18/2017   Procedure: CARDIOVERSION;  Surgeon: Sanda Klein, MD;  Location: [facility] ENDOSCOPY;  Service: Cardiovascular;  Laterality: N/A;   CARDIOVERSION N/A 12/21/2018   Procedure: CARDIOVERSION;  Surgeon: Thayer Headings, MD;  Location[facility] END[facility];  Service: Cardiovascular;  Laterality: N/A;   KNEE SURGERY  2013   MASTECTOMY  1998   left side   PACEMAKER IMPLANT N/A 07/22/2019   Procedure: PACEMAKER IMPLANT;  Surgeon: Evans Lance, MD;  Location: [facility];  Service: Cardiovascular;  Laterality: N/A;    FAMILY HISTORY Family History  Problem Relation Age of Onset   Stroke Mother    Kidney failure Father        died at 10   Heart disease Sister        died at 39   Heart disease Sister        died at 68   Breast cancer Daughter     SOCIAL HISTORY Social History   Tobacco Use   Smoking status: Former    Types: Cigarettes    Quit date: 08/31/1966    Years since quitting: 54.2   Smokeless tobacco: Never  Vaping Use   Vaping Use: Never used  Substance Use Topics   Alcohol use: No   Drug use: No         OPHTHALMIC EXAM:  Base Eye Exam     Visual Acuity (ETDRS)       Right Left   Dist [place] 20/60 -2 CF at 3'   Dist ph [place] 20/40 +1 NI         Tonometry (Tonopen, 2:48 PM)       Right Left   Pressure 13 12         Pupils       Pupils Dark Light Shape React APD   Right PERRL 5 4 Round Slow None   Left PERRL 5 4 Round Slow None         Visual Fields       Left Right     Full   Restrictions Central scotoma          Extraocular Movement       Right Left    Full, Ortho Full, Ortho          Neuro/Psych     Oriented x3: Yes   Mood/Affect: Normal  Dilation     Right eye: 1.0% Mydriacyl, 2.5% Phenylephrine @ 2:48 PM           Slit Lamp and Fundus Exam     External Exam       Right Left   External Normal Normal         Slit Lamp Exam       Right Left   Lids/Lashes Normal Normal   Conjunctiva/Sclera White and quiet White and quiet   Cornea Clear Clear   Anterior Chamber Deep and quiet Deep and quiet   Iris Round and reactive Round and reactive   Lens Posterior chamber intraocular lens, Open posterior capsule Posterior chamber intraocular lens, Open posterior capsule   Anterior Vitreous Normal Normal         Fundus Exam       Right Left   Posterior Vitreous Normal Normal   Disc Normal Normal   C/D Ratio 0.45 0.5   Macula Geographic atrophy approaching nasal FAZ, Advanced age related macular degeneration, Disciform scar nasal to faz, , Drusen, Retinal pigment epithelial mottling Geographic atrophy, Advanced age related macular degeneration, Disciform scar, Drusen, Retinal pigment epithelial mottling   Vessels Normal Normal   Periphery Normal Normal            IMAGING AND PROCEDURES  Imaging and Procedures for 11/06/20  OCT, Retina - OU - Both Eyes       Right Eye Quality was good. Scan locations included subfoveal. Central Foveal Thickness: 309. Progression has improved. Findings include intraretinal fluid, vitreomacular adhesion , pigment epithelial detachment, subretinal scarring, disciform scar.   Left Eye Quality was good. Scan locations included subfoveal. Central Foveal Thickness: 270. Progression has been stable. Findings include no SRF, retinal drusen , outer retinal atrophy, central retinal atrophy, subretinal hyper-reflective material, subretinal scarring.   Notes Pigment epithelial detachment with intraretinal fluid nasal to the fovea OD is stable,, at 12-week interval follow-up today, repeat injection Eylea today  examination next 8 weeks  OS with advanced dry atrophic ARMD stable, no change in thickness OS, the computer selected lower baseline     Intravitreal Injection, Pharmacologic Agent - OD - Right Eye       Time Out 11/06/2020. 3:19 PM. Confirmed correct patient, procedure, site, and patient consented.   Anesthesia Topical anesthesia was used. Anesthetic medications included Lidocaine 4%.   Procedure Preparation included Tobramycin 0.3%, 10% betadine to eyelids, 5% betadine to ocular surface, Ofloxacin . A 30 gauge needle was used.   Injection: 2 mg aflibercept 2 MG/0.05ML   Route: Intravitreal, Site: Right Eye   NDC: [id number], Lot: [id number], Waste: 0 mL   Post-op Post injection exam found visual acuity of at least counting fingers. The patient tolerated the procedure well. There were no complications. The patient received written and verbal post procedure care education. Post injection medications included ocuflox.              ASSESSMENT/PLAN:  Exudative age-related macular degeneration of right eye with active choroidal neovascularization (HCC) The nature of wet macular degeneration was discussed with the patient.  Forms of therapy reviewed include the use of Anti-VEGF medications injected painlessly into the eye, as well as other possible treatment modalities, including thermal laser therapy. Fellow eye involvement and risks were discussed with the patient. Upon the finding of wet age related macular degeneration, treatment will be offered. The treatment regimen is on a treat as needed basis with the intent to treat if necessary and extend interval of  exams when possible. On average 1 out of 6 patients do not need lifetime therapy. However, the risk of recurrent disease is high for a lifetime.  Initially monthly, then periodic, examinations and evaluations will determine whether the next treatment is required on the day of the examination.  OD with subfoveal pigment  epithelial detachment slight thickening might in fact be worsening of CME from wet AMD at 12-week interval, Repeat injection intravitreal Eylea today and examination next in 8 weeks,  Advanced nonexudative age-related macular degeneration of right eye without subfoveal involvement Advancing atrophy visible clinically  Advanced nonexudative age-related macular degeneration of left eye with subfoveal involvement Advanced OS by OCT     ICD-10-CM   1. Exudative age-related macular degeneration of right eye with active choroidal neovascularization (HCC)  H35.3211 OCT, Retina - OU - Both Eyes    Intravitreal Injection, Pharmacologic Agent - OD - Right Eye    aflibercept (EYLEA) SOLN 2 mg    2. Advanced nonexudative age-related macular degeneration of right eye without subfoveal involvement  H35.3113     3. Advanced nonexudative age-related macular degeneration of left eye with subfoveal involvement  H35.3124       1.  Recent fall and knee injury led to systemic stress and hospitalization.  Might explain the apparent worsening of wet AMD OD at 12 weeks interval examination post recent injection    2.  Repeat intravitreal Eylea today and follow-up next in 8 weeks  3.  Ophthalmic Meds Ordered this visit:  Meds ordered this encounter  Medications   aflibercept (EYLEA) SOLN 2 mg       Return in about 8 weeks (around 01/01/2021) for DILATE OU, EYLEA OCT, OD.  There are no Patient Instructions on file for this visit.   Explained the diagnoses, plan, and follow up with the patient and they expressed understanding.  Patient expressed understanding of the importance of proper follow up care.   Clent Demark Alyviah Crandle M.D. Diseases & Surgery of the Retina and Vitreous [facility] 11/06/20     Abbreviations: M myopia (nearsighted); A astigmatism; H hyperopia (farsighted); P presbyopia; Mrx spectacle prescription;  CTL contact lenses; OD right eye; OS left eye; OU both eyes   XT exotropia; ET esotropia; PEK punctate epithelial keratitis; PEE punctate epithelial erosions; DES dry eye syndrome; MGD meibomian gland dysfunction; ATs artificial tears; PFAT's preservative free artificial tears; [place] nuclear sclerotic cataract; PSC posterior subcapsular cataract; ERM epi-retinal membrane; PVD posterior vitreous detachment; RD retinal detachment; DM diabetes mellitus; DR diabetic retinopathy; NPDR non-proliferative diabetic retinopathy; PDR proliferative diabetic retinopathy; CSME clinically significant macular edema; DME diabetic macular edema; dbh dot blot hemorrhages; CWS cotton wool spot; POAG primary open angle glaucoma; C/D cup-to-disc ratio; HVF humphrey visual field; GVF goldmann visual field; OCT optical coherence tomography; IOP intraocular pressure; BRVO Branch retinal vein occlusion; CRVO central retinal vein occlusion; CRAO central retinal artery occlusion; BRAO branch retinal artery occlusion; RT retinal tear; SB scleral buckle; PPV pars plana vitrectomy; VH Vitreous hemorrhage; PRP panretinal laser photocoagulation; IVK intravitreal kenalog; VMT vitreomacular traction; MH Macular hole;  NVD neovascularization of the disc; NVE neovascularization elsewhere; AREDS age related eye disease study; ARMD age related macular degeneration; POAG primary open angle glaucoma; EBMD epithelial/anterior basement membrane dystrophy; ACIOL anterior chamber intraocular lens; IOL intraocular lens; PCIOL posterior chamber intraocular lens; Phaco/IOL phacoemulsification with intraocular lens placement; [place] photorefractive keratectomy; LASIK laser assisted in situ keratomileusis; HTN hypertension; DM diabetes mellitus; COPD chronic obstructive pulmonary disease

## 2020-11-07 DIAGNOSIS — I129 Hypertensive chronic kidney disease with stage 1 through stage 4 chronic kidney disease, or unspecified chronic kidney disease: Secondary | ICD-10-CM | POA: Diagnosis not present

## 2020-11-07 DIAGNOSIS — S81001D Unspecified open wound, right knee, subsequent encounter: Secondary | ICD-10-CM | POA: Diagnosis not present

## 2020-11-07 DIAGNOSIS — I4819 Other persistent atrial fibrillation: Secondary | ICD-10-CM | POA: Diagnosis not present

## 2020-11-07 DIAGNOSIS — H3530 Unspecified macular degeneration: Secondary | ICD-10-CM | POA: Diagnosis not present

## 2020-11-07 DIAGNOSIS — N184 Chronic kidney disease, stage 4 (severe): Secondary | ICD-10-CM | POA: Diagnosis not present

## 2020-11-07 DIAGNOSIS — D62 Acute posthemorrhagic anemia: Secondary | ICD-10-CM | POA: Diagnosis not present

## 2020-11-08 ENCOUNTER — Telehealth: Payer: Self-pay | Admitting: Cardiovascular Disease

## 2020-11-08 ENCOUNTER — Telehealth: Payer: Self-pay | Admitting: Internal Medicine

## 2020-11-08 DIAGNOSIS — I4819 Other persistent atrial fibrillation: Secondary | ICD-10-CM | POA: Diagnosis not present

## 2020-11-08 DIAGNOSIS — S81001D Unspecified open wound, right knee, subsequent encounter: Secondary | ICD-10-CM | POA: Diagnosis not present

## 2020-11-08 DIAGNOSIS — I129 Hypertensive chronic kidney disease with stage 1 through stage 4 chronic kidney disease, or unspecified chronic kidney disease: Secondary | ICD-10-CM | POA: Diagnosis not present

## 2020-11-08 DIAGNOSIS — H3530 Unspecified macular degeneration: Secondary | ICD-10-CM | POA: Diagnosis not present

## 2020-11-08 DIAGNOSIS — N184 Chronic kidney disease, stage 4 (severe): Secondary | ICD-10-CM | POA: Diagnosis not present

## 2020-11-08 DIAGNOSIS — D62 Acute posthemorrhagic anemia: Secondary | ICD-10-CM | POA: Diagnosis not present

## 2020-11-08 MED ORDER — CARVEDILOL 12.5 MG PO TABS: 12.5000 mg | ORAL_TABLET | Freq: Two times a day (BID) | ORAL | 3 refills | Status: DC

## 2020-11-08 NOTE — Telephone Encounter (Signed)
A user error has taken place: encounter opened in error, closed for administrative reasons.

## 2020-11-08 NOTE — Telephone Encounter (Signed)
Rx(s) sent to pharmacy electronically.  

## 2020-11-08 NOTE — Telephone Encounter (Signed)
*  STAT* If patient is at the pharmacy, call can be transferred to refill team.   1. Which medications need to be refilled? (please list name of each medication and dose if known) carvedilol (COREG) 12.5 MG tablet  2. Which pharmacy/location (including street and city if local pharmacy) is medication to be sent to? [facility], [place]  3. Do they need a 30 day or 90 day supply? 30ds

## 2020-11-12 DIAGNOSIS — N184 Chronic kidney disease, stage 4 (severe): Secondary | ICD-10-CM | POA: Diagnosis not present

## 2020-11-12 DIAGNOSIS — E781 Pure hyperglyceridemia: Secondary | ICD-10-CM | POA: Diagnosis not present

## 2020-11-12 DIAGNOSIS — M179 Osteoarthritis of knee, unspecified: Secondary | ICD-10-CM | POA: Diagnosis not present

## 2020-11-12 DIAGNOSIS — G4700 Insomnia, unspecified: Secondary | ICD-10-CM | POA: Diagnosis not present

## 2020-11-12 DIAGNOSIS — I10 Essential (primary) hypertension: Secondary | ICD-10-CM | POA: Diagnosis not present

## 2020-11-12 DIAGNOSIS — K219 Gastro-esophageal reflux disease without esophagitis: Secondary | ICD-10-CM | POA: Diagnosis not present

## 2020-11-12 DIAGNOSIS — I4891 Unspecified atrial fibrillation: Secondary | ICD-10-CM | POA: Diagnosis not present

## 2020-11-12 DIAGNOSIS — D649 Anemia, unspecified: Secondary | ICD-10-CM | POA: Diagnosis not present

## 2020-11-14 ENCOUNTER — Other Ambulatory Visit (HOSPITAL_COMMUNITY): Payer: Self-pay | Admitting: Nurse Practitioner

## 2020-11-14 DIAGNOSIS — I4891 Unspecified atrial fibrillation: Secondary | ICD-10-CM

## 2020-11-14 NOTE — Telephone Encounter (Signed)
Prescription refill request for Eliquis received. Indication:Afib Last office visit:10/22 Scr:1.9 Age: 87 Weight:58.2 kg  Prescription refilled

## 2020-11-15 DIAGNOSIS — N319 Neuromuscular dysfunction of bladder, unspecified: Secondary | ICD-10-CM | POA: Diagnosis not present

## 2020-11-15 DIAGNOSIS — R338 Other retention of urine: Secondary | ICD-10-CM | POA: Diagnosis not present

## 2020-11-16 ENCOUNTER — Telehealth: Payer: Self-pay | Admitting: General Practice

## 2020-11-16 NOTE — Telephone Encounter (Signed)
Returned call to patient who states she was calling in due to hearing from her urologist that she may need surgery to fix her bladder issues. Patient states she was calling to see if Denyse Amass could speak with Dr. Oval Linsey regarding her risks for surgery and being put to sleep. Advised patient to have her urologist send our office a clearance form and our pre op team will review risk for surgery. Patient verbalized understanding. Will forward to Coletta Memos, NP as Juluis Rainier.

## 2020-11-16 NOTE — Telephone Encounter (Signed)
   Pt requesting to speak with Allison Norman, she said its very complicated and she rather speak with him

## 2020-11-23 DIAGNOSIS — R338 Other retention of urine: Secondary | ICD-10-CM | POA: Diagnosis not present

## 2020-11-23 DIAGNOSIS — N319 Neuromuscular dysfunction of bladder, unspecified: Secondary | ICD-10-CM | POA: Diagnosis not present

## 2020-11-26 ENCOUNTER — Encounter: Payer: Self-pay | Admitting: Internal Medicine

## 2020-11-26 ENCOUNTER — Other Ambulatory Visit: Payer: Self-pay

## 2020-11-26 ENCOUNTER — Ambulatory Visit (INDEPENDENT_AMBULATORY_CARE_PROVIDER_SITE_OTHER): Payer: Medicare Other | Admitting: Internal Medicine

## 2020-11-26 VITALS — BP 116/64 | HR 99 | Ht 65.0 in | Wt 138.0 lb

## 2020-11-26 DIAGNOSIS — I495 Sick sinus syndrome: Secondary | ICD-10-CM

## 2020-11-26 DIAGNOSIS — Z950 Presence of cardiac pacemaker: Secondary | ICD-10-CM | POA: Diagnosis not present

## 2020-11-26 MED ORDER — DIGOXIN 125 MCG PO TABS: 0.1250 mg | ORAL_TABLET | ORAL | 3 refills | Status: DC

## 2020-11-26 NOTE — Patient Instructions (Addendum)
Medication Instructions:   Your physician has recommended you make the following change in your medication:    START taking digoxin 0.125 mg-  Take one tablet by mouth EVERY OTHER DAY  Labwork: None ordered.  Testing/Procedures: None ordered.  Follow-Up:  You will follow up with Tommye Standard in 6-8 weeks.  Your physician wants you to follow-up in: 6 months with Cristopher Peru, MD   Remote monitoring is used to monitor your Pacemaker from home. This monitoring reduces the number of office visits required to check your device to one time per year. It allows [place] to keep an eye on the functioning of your device to ensure it is working properly. You are scheduled for a device check from home on 01/18/2021. You may send your transmission at any time that day. If you have a wireless device, the transmission will be sent automatically. After your physician reviews your transmission, you will receive a postcard with your next transmission date.  Any Other Special Instructions Will Be Listed Below (If Applicable).  If you need a refill on your cardiac medications before your next appointment, please call your pharmacy.   Digoxin Tablets What is this medication? DIGOXIN (di JOX in) treats heart failure. It may also be used to treat a type of arrhythmia known as AFib (atrial fibrillation). It works by helping your heart beat stronger, making it easier for your heart to pump blood to the rest of the body. It also slows down overactive electric signals in the heart, which stabilizes your heart rhythm. This medicine may be used for other purposes; ask your health care provider or pharmacist if you have questions. COMMON BRAND NAME(S): Digitek, Lanoxicaps, Lanoxin What should I tell my care team before I take this medication? They need to know if you have any of these conditions: Certain heart rhythm disorders Heart disease or recent heart attack Kidney or liver disease An unusual or allergic reaction  to digoxin, other medications, foods, dyes, or preservatives Pregnant or trying to get pregnant Breast-feeding How should I use this medication? Take this medication by mouth with a glass of water. Follow the directions on the prescription label. Take your doses at regular intervals. Do not take your medication more often than directed. Talk to your care team about the use of this medication in children. Special care may be needed. Overdosage: If you think you have taken too much of this medicine contact a poison control center or emergency room at once. NOTE: This medicine is only for you. Do not share this medicine with others. What if I miss a dose? If you miss a dose, take it as soon as you can. If it is almost time for your next dose, take only that dose. Do not take double or extra doses. What may interact with this medication? Activated charcoal Albuterol Alprazolam Antacids Antiviral medications for HIV or AIDS like ritonavir and saquinavir Calcium Certain antibiotics like azithromycin, clarithromycin, erythromycin, gentamicin, neomycin, trimethoprim, and tetracycline Certain medications for blood pressure, heart disease, irregular heart beat Certain medications for cancer Certain medications for cholesterol like atorvastatin, cholestyramine, and colestipol Certain medications for diabetes, like acarbose, exenatide, miglitol, and metformin Certain medications for fungal infections like ketoconazole and itraconazole Certain medications for stomach problems like omeprazole, esomeprazole, lansoprazole, rabeprazole, metoclopramide, and sucralfate Conivaptan Cyclosporine Diphenoxylate Epinephrine Kaolin; pectin Nefazodone NSAIDS, medications for pain and inflammation, like celecoxib, ibuprofen, or naproxen Penicillamine Phenytoin Propantheline Quinine Phenytoin Rifampin Succinylcholine St. John's Wort Sulfasalazine Teriparatide Thyroid hormones Tolvaptan This list  may  not describe all possible interactions. Give your health care provider a list of all the medicines, herbs, non-prescription drugs, or dietary supplements you use. Also tell them if you smoke, drink alcohol, or use illegal drugs. Some items may interact with your medicine. What should I watch for while using this medication? Visit your care team for regular checks on your progress. Do not stop taking this medication without the advice of your care team, even if you feel better. Do not change the brand you are taking, other brands may affect you differently. Check your heart rate and blood pressure regularly while you are taking this medication. Ask your care team what your heart rate and blood pressure should be, and when you should contact him or her. Your care team also may schedule regular blood tests and electrocardiograms to check your progress. Watch your diet. Less digoxin may be absorbed from the stomach if you have a diet high in bran fiber. Do not treat yourself for coughs, colds or allergies without asking your care team for advice. Some ingredients can increase possible side effects. What side effects may I notice from receiving this medication? Side effects that you should report to your care team as soon as possible: Allergic reactions--skin rash, itching, hives, swelling of the face, lips, tongue, or throat Digoxin toxicity--confusion, loss of appetite, nausea, vomiting, diarrhea, change in vision such as blurry or yellow vision, fatigue, fast or irregular heartbeat Slow heartbeat--dizziness, feeling faint or lightheaded, confusion, trouble breathing, unusual weakness or fatigue Side effects that usually do not require medical attention (report to your care team if they continue or are bothersome): Dizziness Stomach pain Unexpected breast tissue growth This list may not describe all possible side effects. Call your doctor for medical advice about side effects. You may report side effects  to FDA at 1-800-FDA-1088. Where should I keep my medication? Keep out of the reach of children. Store at room temperature between 15 and 30 degrees C (59 and 86 degrees F). Protect from light and moisture. Throw away any unused medication after the expiration date. NOTE: This sheet is a summary. It may not cover all possible information. If you have questions about this medicine, talk to your doctor, pharmacist, or health care provider.  2022 Elsevier/Gold Standard (2020-04-01 00:00:00)

## 2020-11-26 NOTE — Progress Notes (Signed)
HPI [name] returns today for followup. She is a pleasant 87 yo woman with a h/o symptomatic tachy-brady syndrome, PAF who has been maintained on amiodarone, s/p PPM insertion 16 months ago. In the interim, she notes she feels well. She has been sedentary. No palpitations or sob. She has had some trouble with urinary continence and is wearing a foley catheter. She has had some trouble with UTI's.  Allergies  Allergen Reactions   Codeine Itching   Penicillins Itching, Rash and Other (See Comments)    Has patient had a PCN reaction causing immediate rash, facial/tongue/throat swelling, SOB or lightheadedness with hypotension: Yes Has patient had a PCN reaction causing severe rash involving mucus membranes or skin necrosis: No Has patient had a PCN reaction that required hospitalization: No Has patient had a PCN reaction occurring within the last 10 years: No If all of the above answers are "NO", then may proceed with Cephalosporin use.   Doxazosin     Heavy feeling in chest, congestion, wheezing   Doxycycline Other (See Comments)    Cause chest tightness   Doxycycline Hyclate Other (See Comments)   Hydralazine     Felt bad and could hear pulse in head      Current Outpatient Medications  Medication Sig Dispense Refill   acetaminophen (TYLENOL) 500 MG tablet Take 1,000 mg by mouth every 6 (six) hours as needed for moderate pain or headache.     Aflibercept (EYLEA) 2 MG/0.05ML SOLN 2 mg by Intravitreal route every 3 (three) months. Inject in R eye every 11 weeks per Retina specialist for Macular Degenration     amiodarone (PACERONE) 200 MG tablet Take 1/2 (one-half) tablet by mouth once daily 45 tablet 3   apixaban (ELIQUIS) 2.5 MG TABS tablet Take 1 tablet by mouth twice daily 180 tablet 1   B Complex Vitamins (VITAMIN-B COMPLEX PO) Take 1 tablet by mouth daily.     carvedilol (COREG) 12.5 MG tablet Take 1 tablet (12.5 mg total) by mouth 2 (two) times daily with a meal. 180  tablet 3   cloNIDine (CATAPRES) 0.1 MG tablet Take 2 tablets (0.2 mg total) by mouth 2 (two) times daily. 60 tablet 0   diazepam (VALIUM) 2 MG tablet Take 2 mg by mouth at bedtime as needed for anxiety (for sleep).  0   digoxin (LANOXIN) 0.125 MG tablet Take 1 tablet (0.125 mg total) by mouth every other day. 45 tablet 3   ferrous sulfate 325 (65 FE) MG tablet Take 1 tablet (325 mg total) by mouth daily. 30 tablet 3   furosemide (LASIX) 20 MG tablet Take 1 tablet (20 mg total) by mouth daily. 90 tablet 3   Multiple Vitamins-Minerals (PRESERVISION AREDS 2) CAPS Take 1 capsule by mouth daily.     pantoprazole (PROTONIX) 40 MG tablet Take 1 tablet (40 mg total) by mouth daily. 30 tablet 1   polyethylene glycol (MIRALAX / GLYCOLAX) 17 g packet Take 17 g by mouth daily. 14 each 0   predniSONE (DELTASONE) 5 MG tablet Take 5 mg by mouth daily as needed (pain).     traMADol (ULTRAM) 50 MG tablet Take 0.5 tablets (25 mg total) by mouth every 6 (six) hours as needed for moderate pain. 6 tablet 0   cephALEXin (KEFLEX) 500 MG capsule Take 1 capsule (500 mg total) by mouth 2 (two) times daily. (Patient not taking: Reported on 11/26/2020) 6 capsule 0   No current facility-administered medications for this visit.  Past Medical History:  Diagnosis Date   Atrial fibrillation ([place]) 2017   a. s/p DCCV in 10/2015  b. recurrent in 08/2016 --> rate-control pursued.    Cancer ([place])    Breast   CKD (chronic kidney disease)    GCA (giant cell arteritis) ([place])    Hordeolum internum left lower eyelid 06/28/2019   Hypertension    Macular degeneration    Osteoporosis    PMR (polymyalgia rheumatica) ([facility])    Resistant hypertension 03/15/2019   Shortness of breath 03/15/2019   Skin cancer     ROS:   All systems reviewed and negative except as noted in the HPI.   Past Surgical History:  Procedure Laterality Date   CARDIOVERSION N/A 10/18/2015   Procedure: CARDIOVERSION;  Surgeon: Jerline Pain, MD;   Location: [facility];  Service: Cardiovascular;  Laterality: N/A;   CARDIOVERSION N/A 02/20/2017   Procedure: CARDIOVERSION;  Surgeon: Pixie Casino, MD;  Location: [facility] ENDOSCOPY;  Service: Cardiovascular;  Laterality: N/A;   CARDIOVERSION N/A 06/18/2017   Procedure: CARDIOVERSION;  Surgeon: Sanda Klein, MD;  Location: [facility];  Service: Cardiovascular;  Laterality: N/A;   CARDIOVERSION N/A 12/21/2018   Procedure: CARDIOVERSION;  Surgeon: Thayer Headings, MD;  Location: [facility] ENDOSCOPY;  Service: Cardiovascular;  Laterality: N/A;   KNEE SURGERY  2013   MASTECTOMY  1998   left side   PACEMAKER IMPLANT N/A 07/22/2019   Procedure: PACEMAKER IMPLANT;  Surgeon: Evans Lance, MD;  Location: [facility] CV LAB;  Service: Cardiovascular;  Laterality: N/A;     Family History  Problem Relation Age of Onset   Stroke Mother    Kidney failure Father        died at 76   Heart disease Sister        died at 54   Heart disease Sister        died at 60   Breast cancer Daughter      Social History   Socioeconomic History   Marital status: Married    Spouse name: Not on file   Number of children: 2   Years of education: Not on file   Highest education level: Not on file  Occupational History   Not on file  Tobacco Use   Smoking status: Former    Types: Cigarettes    Quit date: 08/31/1966    Years since quitting: 54.2   Smokeless tobacco: Never  Vaping Use   Vaping Use: Never used  Substance and Sexual Activity   Alcohol use: No   Drug use: No   Sexual activity: Not on file  Other Topics Concern   Not on file  Social History Narrative   epworth sleepiness scale = 8 (08/31/15)   Social Determinants of Health   Financial Resource Strain: Not on file  Food Insecurity: Not on file  Transportation Needs: Not on file  Physical Activity: Not on file  Stress: Not on file  Social Connections: Not on file  Intimate Partner Violence: Not on file     BP 116/64   Pulse 99    Ht 5\' 5"  (1.651 m)   Wt 138 lb (62.6 kg)   SpO2 98%   BMI 22.96 kg/m   Physical Exam:  Well appearing NAD HEENT: Unremarkable Neck:  No JVD, no thyromegally Lymphatics:  No adenopathy Back:  No CVA tenderness Lungs:  Clear with no wheezes HEART:  Regular rate rhythm, no murmurs, no rubs, no clicks Abd:  soft, positive bowel  sounds, no organomegally, no rebound, no guarding Ext:  2 plus pulses, no edema, no cyanosis, no clubbing Skin:  No rashes no nodules Neuro:  CN II through XII intact, motor grossly intact  EKG - atypical atrial flutter with a RVR  DEVICE  Normal device function.  See PaceArt for details.   Assess/Plan:  Atrial fib/flutter - she is out of rhythm about a 1/3 of the time. She will continue low dose amiodarone. She is asymptomatic but I am concerned about her rates and have asked her to add low dose digoxin 0.125 mg qod. I might consider increasing her dose of amio in the future. Sinus node dysfunction - she is asymptomatic s/p PPM insertion. Coags - she has not had any bleeding on eliquis. PPM - her [employer] DDD PM is working normally. We will follow.  Carleene Overlie Marian Meneely,MD

## 2020-12-11 DIAGNOSIS — N184 Chronic kidney disease, stage 4 (severe): Secondary | ICD-10-CM | POA: Diagnosis not present

## 2020-12-11 DIAGNOSIS — Z23 Encounter for immunization: Secondary | ICD-10-CM | POA: Diagnosis not present

## 2020-12-11 DIAGNOSIS — Z0000 Encounter for general adult medical examination without abnormal findings: Secondary | ICD-10-CM | POA: Diagnosis not present

## 2020-12-11 DIAGNOSIS — D649 Anemia, unspecified: Secondary | ICD-10-CM | POA: Diagnosis not present

## 2020-12-11 DIAGNOSIS — F411 Generalized anxiety disorder: Secondary | ICD-10-CM | POA: Diagnosis not present

## 2020-12-11 DIAGNOSIS — G479 Sleep disorder, unspecified: Secondary | ICD-10-CM | POA: Diagnosis not present

## 2020-12-11 DIAGNOSIS — K219 Gastro-esophageal reflux disease without esophagitis: Secondary | ICD-10-CM | POA: Diagnosis not present

## 2020-12-11 DIAGNOSIS — E781 Pure hyperglyceridemia: Secondary | ICD-10-CM | POA: Diagnosis not present

## 2020-12-11 DIAGNOSIS — M353 Polymyalgia rheumatica: Secondary | ICD-10-CM | POA: Diagnosis not present

## 2020-12-17 ENCOUNTER — Other Ambulatory Visit: Payer: Self-pay | Admitting: Cardiovascular Disease

## 2020-12-19 DIAGNOSIS — R338 Other retention of urine: Secondary | ICD-10-CM | POA: Diagnosis not present

## 2021-01-01 ENCOUNTER — Encounter (INDEPENDENT_AMBULATORY_CARE_PROVIDER_SITE_OTHER): Payer: Medicare Other | Admitting: Ophthalmology

## 2021-01-03 ENCOUNTER — Other Ambulatory Visit (HOSPITAL_COMMUNITY): Payer: Self-pay | Admitting: Urology

## 2021-01-03 DIAGNOSIS — R339 Retention of urine, unspecified: Secondary | ICD-10-CM

## 2021-01-04 ENCOUNTER — Encounter (HOSPITAL_COMMUNITY): Payer: Self-pay | Admitting: Radiology

## 2021-01-04 NOTE — Progress Notes (Signed)
Patient Name  Versie, Soave Legal Sex  Female DOB  [birth year] SSN  [id number] Address  [address]  [place] [address] Phone  [phone] (Home)  [phone] (Mobile)    RE: CT IMAGE GUIDED DRAINAGE BY PERCUTANEOUS CATHETER Received: Dow Adolph, Geroge Baseman, NP  Garth Bigness D That sounds appropriate.  Roderic Palau        Previous Messages   ----- Message -----  From: Garth Bigness D  Sent: 01/03/2021   9:59 AM EST  To: Sherran Needs, NP  Subject: FW: CT IMAGE GUIDED DRAINAGE BY PERCUTANEOUS*   Good morning Mrs Butch Penny, this patient has an order in for a Suprapubic catheter placement and she is on Eliquis and will need to hold for 2 days prior. Please advise if okay to hold for two days prior to procedure. Thanks Aniceto Boss  ----- Message -----  From: Garth Bigness D  Sent: 01/03/2021   9:55 AM EST  To: Ir Procedure Requests  Subject: CT IMAGE GUIDED DRAINAGE BY PERCUTANEOUS CAT*   Procedure:  CT IMAGE GUIDED DRAINAGE BY PERCUTANEOUS CATHETER   Reason:  Urinary retention, placement of suprapubic catheter/foley   History:  [place] in computer   Provider:  Remi Haggard   Provider Contact:  [phone]

## 2021-01-04 NOTE — Progress Notes (Signed)
Patient Name  Allison Norman, Allison Norman Legal Sex  Female DOB  06/29/1924 SSN  [id number] Address  [address]  [place] [address] Phone  [phone] (Home)  [phone] (Mobile)    RE: CT IMAGE GUIDED DRAINAGE BY PERCUTANEOUS CATHETER Received: Today Criselda Peaches, MD  Garth Bigness D; P Ir Procedure Requests I'm not sure these require prior approval - but approved nonetheless. Suprapubic tube placement in CT.   HKM        Previous Messages   ----- Message -----  From: Garth Bigness D  Sent: 01/03/2021   9:55 AM EST  To: Ir Procedure Requests  Subject: CT IMAGE GUIDED DRAINAGE BY PERCUTANEOUS CAT*   Procedure:  CT IMAGE GUIDED DRAINAGE BY PERCUTANEOUS CATHETER   Reason:  Urinary retention, placement of suprapubic catheter/foley   History:  [place] in computer   Provider:  Remi Haggard   Provider Contact:  [phone]

## 2021-01-10 ENCOUNTER — Telehealth: Payer: Self-pay | Admitting: Physician Assistant

## 2021-01-10 ENCOUNTER — Encounter (INDEPENDENT_AMBULATORY_CARE_PROVIDER_SITE_OTHER): Payer: Medicare Other | Admitting: Ophthalmology

## 2021-01-10 NOTE — Telephone Encounter (Signed)
Pt is wanting to speak with PA [name].. no details given... please advise

## 2021-01-10 NOTE — Telephone Encounter (Signed)
Dr. Lovena Le pt.  I don't see that she has ever seen [name].  Has upcoming appt with her on 01/29/21.

## 2021-01-10 NOTE — Telephone Encounter (Signed)
Returned call to pt.  Pt was wondering if digoxin would raise her BP.  Advised it would not affect her BP.  BP last night was 158/94, which she says is the highest it has been.  Pt does have a procedure coming up, but she does not think she is worried about it.  She will continue to monitor.  She has appt scheduled for 01/28/21 with RU to f/u new start digoxin.

## 2021-01-14 ENCOUNTER — Other Ambulatory Visit: Payer: Self-pay | Admitting: Radiology

## 2021-01-14 ENCOUNTER — Encounter: Payer: Medicare Other | Admitting: Physician Assistant

## 2021-01-15 ENCOUNTER — Ambulatory Visit (HOSPITAL_COMMUNITY)
Admission: RE | Admit: 2021-01-15 | Discharge: 2021-01-15 | Disposition: A | Discharge status: Home / Self Care | Payer: Medicare Other | Source: Ambulatory Visit | Attending: Urology | Admitting: Urology

## 2021-01-15 ENCOUNTER — Other Ambulatory Visit: Payer: Self-pay

## 2021-01-15 DIAGNOSIS — Z87891 Personal history of nicotine dependence: Secondary | ICD-10-CM | POA: Insufficient documentation

## 2021-01-15 DIAGNOSIS — Z435 Encounter for attention to cystostomy: Secondary | ICD-10-CM | POA: Diagnosis not present

## 2021-01-15 DIAGNOSIS — N189 Chronic kidney disease, unspecified: Secondary | ICD-10-CM | POA: Diagnosis not present

## 2021-01-15 DIAGNOSIS — Z79899 Other long term (current) drug therapy: Secondary | ICD-10-CM | POA: Insufficient documentation

## 2021-01-15 DIAGNOSIS — R339 Retention of urine, unspecified: Secondary | ICD-10-CM | POA: Insufficient documentation

## 2021-01-15 DIAGNOSIS — Z7901 Long term (current) use of anticoagulants: Secondary | ICD-10-CM | POA: Insufficient documentation

## 2021-01-15 DIAGNOSIS — I129 Hypertensive chronic kidney disease with stage 1 through stage 4 chronic kidney disease, or unspecified chronic kidney disease: Secondary | ICD-10-CM | POA: Insufficient documentation

## 2021-01-15 DIAGNOSIS — I4891 Unspecified atrial fibrillation: Secondary | ICD-10-CM | POA: Insufficient documentation

## 2021-01-15 LAB — CBC
HCT: 41.6 % (ref 36.0–46.0)
Hemoglobin: 12.9 g/dL (ref 12.0–15.0)
MCH: 28.7 pg (ref 26.0–34.0)
MCHC: 31 g/dL (ref 30.0–36.0)
MCV: 92.7 fL (ref 80.0–100.0)
Platelets: 234 10*3/uL (ref 150–400)
RBC: 4.49 MIL/uL (ref 3.87–5.11)
RDW: 16.7 % — ABNORMAL HIGH (ref 11.5–15.5)
WBC: 6.4 10*3/uL (ref 4.0–10.5)
nRBC: 0 % (ref 0.0–0.2)

## 2021-01-15 LAB — PROTIME-INR
INR: 1.2 (ref 0.8–1.2)
Prothrombin Time: 15.5 seconds — ABNORMAL HIGH (ref 11.4–15.2)

## 2021-01-15 MED ORDER — FENTANYL CITRATE (PF) 100 MCG/2ML IJ SOLN
INTRAMUSCULAR | Status: AC
  Filled 2021-01-15: qty 4

## 2021-01-15 MED ORDER — MIDAZOLAM HCL 2 MG/2ML IJ SOLN
INTRAMUSCULAR | Status: AC
  Filled 2021-01-15: qty 4

## 2021-01-15 MED ORDER — SODIUM CHLORIDE 0.9 % IV SOLN: INTRAVENOUS | Status: DC

## 2021-01-15 MED ORDER — FENTANYL CITRATE (PF) 100 MCG/2ML IJ SOLN
INTRAMUSCULAR | Status: AC | PRN
  Administered 2021-01-15: 50 ug via INTRAVENOUS

## 2021-01-15 MED ORDER — MIDAZOLAM HCL 2 MG/2ML IJ SOLN
INTRAMUSCULAR | Status: AC | PRN
Start: 2021-01-15 — End: 2021-01-15
  Administered 2021-01-15: .5 mg via INTRAVENOUS
  Administered 2021-01-15: 1 mg via INTRAVENOUS

## 2021-01-15 NOTE — Procedures (Signed)
Pre procedural Dx: Urinary retention Post procedural Dx: Same  Technically successful CT guided placed of a 14 Fr drainage catheter placement into the urinary bladder yielding clear urine.   Suprapubic catheter connected to foley bag.  EBL: Trace Complications: None immediate  Ronny Bacon, MD Pager #: [phone]

## 2021-01-15 NOTE — H&P (Signed)
Chief Complaint: Urinary retention. Request is for suprapubic catheter placement  Referring Physician(s): Newsome,George B  Supervising Physician: Corrie Mckusick  Patient Status: [facility] - Out-pt  History of Present Illness: Allison Norman is a 88 y.o. female outpatient. History of a fib ( on eliquis), CKD, HTN, HTN, urinary retention.Patient presents for suprapubic catheter placement. Patient is being followed by [facility].   Currently without any significant complaints. Patient alert and laying in bed, calm and comfortable. Denies any fevers, headache, chest pain, SOB, cough, abdominal pain, nausea, vomiting or bleeding. Return precautions and treatment recommendations and follow-up discussed with the patient  who is agreeable with the plan.    Past Medical History:  Diagnosis Date   Atrial fibrillation ([place]) 2017   a. s/p DCCV in 10/2015  b. recurrent in 08/2016 --> rate-control pursued.    Cancer ([place])    Breast   CKD (chronic kidney disease)    GCA (giant cell arteritis) ([facility])    Hordeolum internum left lower eyelid 06/28/2019   Hypertension    Macular degeneration    Osteoporosis    PMR (polymyalgia rheumatica) ([facility])    Resistant hypertension 03/15/2019   Shortness of breath 03/15/2019   Skin cancer     Past Surgical History:  Procedure Laterality Date   CARDIOVERSION N/A 10/18/2015   Procedure: CARDIOVERSION;  Surgeon: Jerline Pain, MD;  Location: [facility];  Service: Cardiovascular;  Laterality: N/A;   CARDIOVERSION N/A 02/20/2017   Procedure: CARDIOVERSION;  Surgeon: Pixie Casino, MD;  Location: [facility];  Service: Cardiovascular;  Laterality: N/A;   CARDIOVERSION N/A 06/18/2017   Procedure: CARDIOVERSION;  Surgeon: Sanda Klein, MD;  Location: [facility] ENDOSCOPY;  Service: Cardiovascular;  Laterality: N/A;   CARDIOVERSION N/A 12/21/2018   Procedure: CARDIOVERSION;  Surgeon: Thayer Headings, MD;  Location: [facility] ENDOSCOPY;  Service: Cardiovascular;   Laterality: N/A;   KNEE SURGERY  2013   MASTECTOMY  1998   left side   PACEMAKER IMPLANT N/A 07/22/2019   Procedure: PACEMAKER IMPLANT;  Surgeon: Evans Lance, MD;  Location: [facility] CV LAB;  Service: Cardiovascular;  Laterality: N/A;    Allergies: Codeine, Penicillins, Doxazosin, Doxycycline, and Hydralazine  Medications: Prior to Admission medications   Medication Sig Start Date End Date Taking? Authorizing Provider  amiodarone (PACERONE) 200 MG tablet Take 1/2 (one-half) tablet by mouth once daily 11/14/20  Yes Hilty, Nadean Corwin, MD  carvedilol (COREG) 12.5 MG tablet Take 1 tablet (12.5 mg total) by mouth 2 (two) times daily with a meal. 11/08/20 01/15/21 Yes Skeet Latch, MD  cloNIDine (CATAPRES) 0.2 MG tablet Take 0.2 mg by mouth 3 (three) times daily. 12/17/20  Yes [provider]  diazepam (VALIUM) 2 MG tablet Take 2 mg by mouth at bedtime. 06/23/16  Yes [provider]  digoxin (LANOXIN) 0.125 MG tablet Take 1 tablet (0.125 mg total) by mouth every other day. 11/26/20  Yes Evans Lance, MD  furosemide (LASIX) 40 MG tablet Take 40 mg by mouth daily.   Yes [provider]  Melatonin 10 MG CAPS Take 10 mg by mouth at bedtime.   Yes [provider]  Multiple Vitamins-Minerals (PRESERVISION AREDS 2) CAPS Take 1 capsule by mouth daily.   Yes [provider]  pantoprazole (PROTONIX) 40 MG tablet Take 1 tablet (40 mg total) by mouth daily. 09/09/20  Yes Kathie Dike, MD  polyethylene glycol (MIRALAX / GLYCOLAX) 17 g packet Take 17 g by mouth daily. Patient taking differently: Take 17  g by mouth daily as needed for moderate constipation. 09/09/20  Yes Kathie Dike, MD  Aflibercept (EYLEA) 2 MG/0.05ML SOLN 2 mg by Intravitreal route every 3 (three) months. Inject in R eye every 11 weeks per Retina specialist for Macular Degenration    [provider]  apixaban (ELIQUIS) 2.5 MG TABS tablet Take 1 tablet by mouth twice daily  11/14/20   Sherran Needs, NP  cloNIDine (CATAPRES) 0.1 MG tablet Take 2 tablets (0.2 mg total) by mouth 2 (two) times daily. Patient not taking: Reported on 01/14/2021 09/09/20   Kathie Dike, MD  ferrous sulfate 325 (65 FE) MG tablet Take 1 tablet (325 mg total) by mouth daily. Patient not taking: Reported on 01/14/2021 08/31/20 08/31/21  Regalado, Jerald Kief A, MD  furosemide (LASIX) 20 MG tablet Take 1 tablet (20 mg total) by mouth daily. Patient not taking: Reported on 01/14/2021 10/16/20   Deberah Pelton, NP  predniSONE (DELTASONE) 5 MG tablet Take 5 mg by mouth daily as needed (arthritis pain). 05/16/20   [provider]  traMADol (ULTRAM) 50 MG tablet Take 0.5 tablets (25 mg total) by mouth every 6 (six) hours as needed for moderate pain. Patient not taking: Reported on 01/14/2021 08/31/20   Elmarie Shiley, MD     Family History  Problem Relation Age of Onset   Stroke Mother    Kidney failure Father        died at 59   Heart disease Sister        died at 7   Heart disease Sister        died at 55   Breast cancer Daughter     Social History   Socioeconomic History   Marital status: Married    Spouse name: Not on file   Number of children: 2   Years of education: Not on file   Highest education level: Not on file  Occupational History   Not on file  Tobacco Use   Smoking status: Former    Types: Cigarettes    Quit date: 08/31/1966    Years since quitting: 54.4   Smokeless tobacco: Never  Vaping Use   Vaping Use: Never used  Substance and Sexual Activity   Alcohol use: No   Drug use: No   Sexual activity: Not on file  Other Topics Concern   Not on file  Social History Narrative   epworth sleepiness scale = 8 (08/31/15)   Social Determinants of Health   Financial Resource Strain: Not on file  Food Insecurity: Not on file  Transportation Needs: Not on file  Physical Activity: Not on file  Stress: Not on file  Social Connections: Not on file    Review of  Systems: A 12 point ROS discussed and pertinent positives are indicated in the HPI above.  All other systems are negative.  Review of Systems  Constitutional:  Negative for fatigue and fever.  HENT:  Negative for congestion.   Respiratory:  Negative for cough and shortness of breath.   Gastrointestinal:  Negative for abdominal pain, diarrhea, nausea and vomiting.   Vital Signs: BP (!) 158/82    Pulse 71    Temp 98.3 F (36.8 C) (Oral)    Resp 18    Ht 5' 5.5" (1.664 m)    Wt 128 lb (58.1 kg)    SpO2 93%    BMI 20.98 kg/m   Physical Exam Vitals and nursing note reviewed.  Constitutional:  Appearance: She is well-developed.  HENT:     Head: Normocephalic and atraumatic.  Eyes:     Conjunctiva/sclera: Conjunctivae normal.  Cardiovascular:     Rate and Rhythm: Normal rate and regular rhythm.     Heart sounds: Normal heart sounds.  Pulmonary:     Effort: Pulmonary effort is normal.     Breath sounds: Normal breath sounds.  Genitourinary:    Comments: Foley catheter in place Musculoskeletal:     Cervical back: Normal range of motion.  Neurological:     Mental Status: She is alert and oriented to person, place, and time.    Imaging: No results found.  Labs:  CBC: Recent Labs    09/05/20 1530 09/06/20 0403 09/07/20 1005 09/08/20 1034  WBC 8.3 7.3 7.6 7.4  HGB 8.5* 7.7* 7.9* 8.8*  HCT 28.3* 24.4* 25.4* 27.2*  PLT 307 287 317 352    COAGS: Recent Labs    08/28/20 0634  INR 1.5*  APTT 31    BMP: Recent Labs    02/17/20 1458 08/28/20 0525 09/06/20 0403 09/07/20 0401 09/08/20 1034 09/09/20 0344  NA 143   < > 132* 129* 126* 133*  K 5.0   < > 4.3 4.8 4.4 4.2  CL 101   < > 100 100 95* 103  CO2 21   < > 24 24 23 24   GLUCOSE 92   < > 92 95 84 88  BUN 32   < > 28* 28* 22 26*  CALCIUM 9.5   < > 7.8* 7.7* 8.0* 8.0*  CREATININE 1.79*   < > 1.63* 1.77* 1.52* 1.95*  GFRNONAA 24*   < > 29* 26* 32* 23*  GFRAA 28*  --   --   --   --   --    < > = values in  this interval not displayed.    LIVER FUNCTION TESTS: Recent Labs    08/30/20 0346 08/31/20 0310 09/05/20 1530 09/06/20 0403  BILITOT 0.9 0.7 1.0 0.7  AST 40 31 25 20   ALT 21 17 20 18   ALKPHOS 79 75 93 79  PROT 5.5* 4.8* 6.2* 5.4*  ALBUMIN 3.0* 2.6* 3.2* 2.7*      Assessment and Plan:  88 y.o. female outpatient. History of a fib ( on eliquis), CKD, HTN, HTN, urinary retention.Patient presents for suprapubic catheter placement. Patient is being followed by [facility].   Patient is on eliquis. Last dose on 1.7.23. No recent labs and other medications are within acceptable parameters. Allergies include codeine, PCN, Doxy. Patient has been NPO since midnight.  Risks and benefits discussed with the patient including bleeding, infection, damage to adjacent structures, bowel perforation/fistula connection, and sepsis.  All of the patient's questions were answered, patient is agreeable to proceed. Consent signed and in chart.   Thank you for this interesting consult.  I greatly enjoyed meeting ARELENE MORONI and look forward to participating in their care.  A copy of this report was sent to the requesting provider on this date.  Electronically Signed: Jacqualine Mau, NP 01/15/2021, 10:13 AM   I spent a total of  30 Minutes   in face to face in clinical consultation, greater than 50% of which was counseling/coordinating care for suprapubic catheter placement

## 2021-01-17 ENCOUNTER — Telehealth: Payer: Self-pay | Admitting: General Practice

## 2021-01-17 MED ORDER — CLONIDINE HCL 0.2 MG PO TABS: 0.2000 mg | ORAL_TABLET | Freq: Three times a day (TID) | ORAL | 3 refills | Status: DC

## 2021-01-17 NOTE — Telephone Encounter (Signed)
Spoke to patient she stated Clonidine was decreased to 0.3 mg three times a day when she was discharged from hospital.Stated she needs a refill.90 day refill sent to pharmacy.

## 2021-01-17 NOTE — Telephone Encounter (Signed)
Patient called stating she wants to speak to Coletta Memos about her medication. Wouldn't go into further detail.

## 2021-01-18 ENCOUNTER — Ambulatory Visit (INDEPENDENT_AMBULATORY_CARE_PROVIDER_SITE_OTHER): Payer: Medicare Other

## 2021-01-18 ENCOUNTER — Telehealth: Payer: Self-pay | Admitting: Internal Medicine

## 2021-01-18 DIAGNOSIS — I495 Sick sinus syndrome: Secondary | ICD-10-CM | POA: Diagnosis not present

## 2021-01-18 NOTE — Telephone Encounter (Signed)
Patient stated she was returning call regarding her PPM. Call transferred to [place].

## 2021-01-18 NOTE — Telephone Encounter (Signed)
Spoke with pt, she reports her monitor is now working properly.

## 2021-01-19 LAB — CUP PACEART REMOTE DEVICE CHECK
Battery Remaining Longevity: 60 mo
Battery Remaining Percentage: 97 %
Brady Statistic RA Percent Paced: 0 %
Brady Statistic RV Percent Paced: 80 %
Date Time Interrogation Session: 20230113022100
Implantable Lead Implant Date: 20210716
Implantable Lead Implant Date: 20210716
Implantable Lead Location: 753859
Implantable Lead Location: 753860
Implantable Lead Model: 7840
Implantable Lead Model: 7841
Implantable Lead Serial Number: 1017229
Implantable Lead Serial Number: 1090224
Implantable Pulse Generator Implant Date: 20210716
Lead Channel Impedance Value: 669 Ohm
Lead Channel Impedance Value: 733 Ohm
Lead Channel Pacing Threshold Amplitude: 1 V
Lead Channel Pacing Threshold Pulse Width: 0.4 ms
Lead Channel Setting Pacing Amplitude: 2 V
Lead Channel Setting Pacing Amplitude: 2.5 V
Lead Channel Setting Pacing Pulse Width: 0.4 ms
Lead Channel Setting Sensing Sensitivity: 2.5 mV
Pulse Gen Serial Number: 547553

## 2021-01-27 NOTE — Progress Notes (Signed)
Cardiology Office Note Date:  01/27/2021  Patient ID:  Allison Norman, Allison Norman 01-30-1924, MRN [id number] PCP:  [name], MD  Cardiologist:  Dr. Oval Linsey Electrophysiologist: Dr. Lovena Le    Chief Complaint:  planned f/u  History of Present Illness: Allison Norman is a 88 y.o. female with history of resistant HTN, CKD (IV), giant cell arteritis, PMR, Afib, chronic CHF (diastolic), tachy-brady w/PPM.  Severe p.HTN 58mmHg, on her echo jan 2022, grade III DD  She last saw cardiology team 10/16/20 by Thomasene Mohair, NP, felt to be euvolemic, following with cardiology, pharmacy team for her HTN, was controlled and doing well, no changes were made.  She comes in today to be seen for Dr. Lovena Le, last seen by him 11/26/20, she was sedentary, doing OK though, issues with incontinence/UTIs and had a foley She was on amiodarone in an atypical flutter and noted AF burden about 1/3 of the time, with increased rates, dig was added QOD, perhaps my need to increase her amio. Planned for f/u  Pt has called with concerns of unusually elevated BP of late and concerned about the dig  TODAY She is accompanied by her caregiver, Lucianne Lei who has been with her for a few years now. She has felt better, when checking her HR ius steady at 55. No CP, palpitations She has some SOB, particularly when rolling over/changing positions in bed, settles quickly, no SOB with casual/her usual pace of walking, ADLS Her BP is better, says back in TID clonidine in communication with Dr. Oval Linsey. No bleeding or signs of bleeding  Remains w/foley, follows w/urology    Device information BSci dual chamber PPM implanted 07/22/2019   Past Medical History:  Diagnosis Date   Atrial fibrillation ([place]) 2017   a. s/p DCCV in 10/2015  b. recurrent in 08/2016 --> rate-control pursued.    Cancer ([place])    Breast   CKD (chronic kidney disease)    GCA (giant cell arteritis) (HCC)    Hordeolum internum left lower eyelid  06/28/2019   Hypertension    Macular degeneration    Osteoporosis    PMR (polymyalgia rheumatica) (HCC)    Resistant hypertension 03/15/2019   Shortness of breath 03/15/2019   Skin cancer     Past Surgical History:  Procedure Laterality Date   CARDIOVERSION N/A 10/18/2015   Procedure: CARDIOVERSION;  Surgeon: Jerline Pain, MD;  Location: [facility];  Service: Cardiovascular;  Laterality: N/A;   CARDIOVERSION N/A 02/20/2017   Procedure: CARDIOVERSION;  Surgeon: Pixie Casino, MD;  Location: [facility];  Service: Cardiovascular;  Laterality: N/A;   CARDIOVERSION N/A 06/18/2017   Procedure: CARDIOVERSION;  Surgeon: Sanda Klein, MD;  Location: [place] [facility]OSCOPY;  Service: Cardiovascular;  Laterality: N/A;   CARDIOVERSION N/A 12/21/2018   Procedure: CARDIOVERSION;  Surgeon: Thayer Headings, MD;  Location: [facility] ENDOSCOPY;  Service: Cardiovascular;  Laterality: N/A;   KNEE SURGERY  2013   MASTECTOMY  1998   left side   PACEMAKER IMPLANT N/A 07/22/2019   Procedure: PACEMAKER IMPLANT;  Surgeon: Evans Lance, MD;  Location: [place] CV LAB;  Service: Cardiovascular;  Laterality: N/A;    Current Outpatient Medications  Medication Sig Dispense Refill   Aflibercept (EYLEA) 2 MG/0.05ML SOLN 2 mg by Intravitreal route every 3 (three) months. Inject in R eye every 11 weeks per Retina specialist for Macular Degenration     amiodarone (PACERONE) 200 MG tablet Take 1/2 (one-half) tablet by mouth once daily 45 tablet 3   apixaban (  ELIQUIS) 2.5 MG TABS tablet Take 1 tablet by mouth twice daily 180 tablet 1   carvedilol (COREG) 12.5 MG tablet Take 1 tablet (12.5 mg total) by mouth 2 (two) times daily with a meal. 180 tablet 3   cloNIDine (CATAPRES) 0.2 MG tablet Take 1 tablet (0.2 mg total) by mouth 3 (three) times daily. 270 tablet 3   diazepam (VALIUM) 2 MG tablet Take 2 mg by mouth at bedtime.  0   digoxin (LANOXIN) 0.125 MG tablet Take 1 tablet (0.125 mg total) by mouth every other day. 45  tablet 3   furosemide (LASIX) 40 MG tablet Take 40 mg by mouth daily.     Melatonin 10 MG CAPS Take 10 mg by mouth at bedtime.     Multiple Vitamins-Minerals (PRESERVISION AREDS 2) CAPS Take 1 capsule by mouth daily.     pantoprazole (PROTONIX) 40 MG tablet Take 1 tablet (40 mg total) by mouth daily. 30 tablet 1   polyethylene glycol (MIRALAX / GLYCOLAX) 17 g packet Take 17 g by mouth daily. (Patient taking differently: Take 17 g by mouth daily as needed for moderate constipation.) 14 each 0   predniSONE (DELTASONE) 5 MG tablet Take 5 mg by mouth daily as needed (arthritis pain).     No current facility-administered medications for this visit.    Allergies:   Codeine, Penicillins, Doxazosin, Doxycycline, and Hydralazine   Social History:  The patient  reports that she quit smoking about 54 years ago. Her smoking use included cigarettes. She has never used smokeless tobacco. She reports that she does not drink alcohol and does not use drugs.   Family History:  The patient's family history includes Breast cancer in her daughter; Heart disease in her sister and sister; Kidney failure in her father; Stroke in her mother.  ROS:  Please see the history of present illness.    All other systems are reviewed and otherwise negative.   PHYSICAL EXAM:  VS:  There were no vitals taken for this visit. BMI: There is no height or weight on file to calculate BMI. Well nourished, well developed, in no acute distress, chronically ill appearing HEENT: normocephalic, atraumatic Neck: no JVD, carotid bruits or masses Cardiac:  RRR; no significant murmurs, no rubs, or gallops Lungs:  CTA b/l, no wheezing, rhonchi or rales Abd: soft, nontender MS: no deformity, age appropriate/perhaps advanced atrophy Ext:  no edema Skin: warm and dry, no rash Neuro:  No gross deficits appreciated Psych: euthymic mood, full affect   PPM site is stable, no tethering or discomfort   EKG:  Done today and reviewed by  myself shows  AP/VS 60bpm  Device interrogation done today and reviewed by myself:  Battery and lead measurements are good AP <1% VP 83% Looks like she converted very recently by histograms   01/30/2020: TTE 1. Left ventricular ejection fraction, by estimation, is 65 to 70%. The  left ventricle has normal function. The left ventricle has no regional  wall motion abnormalities. There is severe concentric left ventricular  hypertrophy. Left ventricular diastolic   parameters are consistent with Grade III diastolic dysfunction  (restrictive). Elevated left atrial pressure.   2. Right ventricular systolic function is mildly reduced. The right  ventricular size is mildly enlarged. There is severely elevated pulmonary  artery systolic pressure. The estimated right ventricular systolic  pressure is 38.4 mmHg.   3. Left atrial size was severely dilated.   4. Right atrial size was severely dilated.   5. The  mitral valve is normal in structure. Mild to moderate mitral valve  regurgitation. No evidence of mitral stenosis. Moderate mitral annular  calcification.   6. Tricuspid valve regurgitation is moderate to severe.   7. The aortic valve is normal in structure. There is severe calcifcation  of the aortic valve. There is severe thickening of the aortic valve.  Aortic valve regurgitation is not visualized. Mild aortic valve stenosis.  Aortic valve mean gradient measures  12.0 mmHg.   8. The inferior vena cava is normal in size with greater than 50%  respiratory variability, suggesting right atrial pressure of 3 mmHg.   Recent Labs: 09/05/2020: B Natriuretic Peptide 874.2 09/06/2020: ALT 18; TSH 3.300 09/09/2020: BUN 26; Creatinine, Ser 1.95; Magnesium 2.3; Potassium 4.2; Sodium 133 01/15/2021: Hemoglobin 12.9; Platelets 234  No results found for requested labs within last 8760 hours.   CrCl cannot be calculated (Patient's most recent lab result is older than the maximum 21 days allowed.).    Wt Readings from Last 3 Encounters:  01/15/21 128 lb (58.1 kg)  11/26/20 138 lb (62.6 kg)  10/16/20 128 lb 6.4 oz (58.2 kg)     Other studies reviewed: Additional studies/records reviewed today include: summarized above  ASSESSMENT AND PLAN:  PPM Intact function No programming changes made  Persistent AFib CHA2DS2Vasc is 5, on eliquis, appropriately dosed 99% % burden Looks like she very recently converted dig level today (she hasn ot taken today, is an off day for her dig)  Chronic CHF P.HTN She sees Dr. Oval Linsey soon  Arterial HTN (resistant) Deferred to Dr. Oval Linsey, looks good today   Disposition: F/u with remotes as usual, dig level from today, in clinic in 51mo, sooner if needed  Current medicines are reviewed at length with the patient today.  The patient did not have any concerns regarding medicines.  Venetia Night, PA-C 01/27/2021 10:19 AM     [facility] [place] [address] [phone] (office)  [phone] (fax)

## 2021-01-28 ENCOUNTER — Encounter (INDEPENDENT_AMBULATORY_CARE_PROVIDER_SITE_OTHER): Payer: Self-pay | Admitting: Ophthalmology

## 2021-01-28 ENCOUNTER — Ambulatory Visit (INDEPENDENT_AMBULATORY_CARE_PROVIDER_SITE_OTHER): Payer: Medicare Other | Admitting: Ophthalmology

## 2021-01-28 ENCOUNTER — Other Ambulatory Visit: Payer: Self-pay

## 2021-01-28 ENCOUNTER — Telehealth: Payer: Self-pay | Admitting: *Deleted

## 2021-01-28 DIAGNOSIS — H353124 Nonexudative age-related macular degeneration, left eye, advanced atrophic with subfoveal involvement: Secondary | ICD-10-CM

## 2021-01-28 DIAGNOSIS — H43821 Vitreomacular adhesion, right eye: Secondary | ICD-10-CM | POA: Diagnosis not present

## 2021-01-28 DIAGNOSIS — H353211 Exudative age-related macular degeneration, right eye, with active choroidal neovascularization: Secondary | ICD-10-CM | POA: Diagnosis not present

## 2021-01-28 DIAGNOSIS — H353113 Nonexudative age-related macular degeneration, right eye, advanced atrophic without subfoveal involvement: Secondary | ICD-10-CM | POA: Diagnosis not present

## 2021-01-28 MED ORDER — AFLIBERCEPT 2MG/0.05ML IZ SOLN FOR KALEIDOSCOPE
2.0000 mg | INTRAVITREAL | Status: AC | PRN
  Administered 2021-01-28: 2 mg via INTRAVITREAL

## 2021-01-28 NOTE — Progress Notes (Signed)
01/28/2021     CHIEF COMPLAINT Patient presents for  Chief Complaint  Patient presents with   Macular Degeneration      HISTORY OF PRESENT ILLNESS: Allison Norman is a 88 y.o. female who presents to the clinic today for:   HPI   OD follow-up now 11.5 weeks postinjection Of Eylea, no interval change in vision in either eye    Last edited by Hurman Horn, MD on 01/28/2021  3:11 PM.      Referring physician: [name], MD [place],  [place] [address]  HISTORICAL INFORMATION:   Selected notes from the MEDICAL RECORD NUMBER       CURRENT MEDICATIONS: Current Outpatient Medications (Ophthalmic Drugs)  Medication Sig   Aflibercept (EYLEA) 2 MG/0.05ML SOLN 2 mg by Intravitreal route every 3 (three) months. Inject in R eye every 11 weeks per Retina specialist for Macular Degenration   No current facility-administered medications for this visit. (Ophthalmic Drugs)   Current Outpatient Medications (Other)  Medication Sig   amiodarone (PACERONE) 200 MG tablet Take 1/2 (one-half) tablet by mouth once daily   apixaban (ELIQUIS) 2.5 MG TABS tablet Take 1 tablet by mouth twice daily   carvedilol (COREG) 12.5 MG tablet Take 1 tablet (12.5 mg total) by mouth 2 (two) times daily with a meal.   cloNIDine (CATAPRES) 0.2 MG tablet Take 1 tablet (0.2 mg total) by mouth 3 (three) times daily.   diazepam (VALIUM) 2 MG tablet Take 2 mg by mouth at bedtime.   digoxin (LANOXIN) 0.125 MG tablet Take 1 tablet (0.125 mg total) by mouth every other day.   furosemide (LASIX) 40 MG tablet Take 40 mg by mouth daily.   Melatonin 10 MG CAPS Take 10 mg by mouth at bedtime.   Multiple Vitamins-Minerals (PRESERVISION AREDS 2) CAPS Take 1 capsule by mouth daily.   pantoprazole (PROTONIX) 40 MG tablet Take 1 tablet (40 mg total) by mouth daily.   polyethylene glycol (MIRALAX / GLYCOLAX) 17 g packet Take 17 g by mouth daily. (Patient taking differently: Take 17 g by mouth daily  as needed for moderate constipation.)   predniSONE (DELTASONE) 5 MG tablet Take 5 mg by mouth daily as needed (arthritis pain).   No current facility-administered medications for this visit. (Other)      REVIEW OF SYSTEMS: ROS   Negative for: Constitutional, Gastrointestinal, Neurological, Skin, Genitourinary, Musculoskeletal, HENT, Endocrine, Cardiovascular, Eyes, Respiratory, Psychiatric, Allergic/Imm, Heme/Lymph Last edited by Hurman Horn, MD on 01/28/2021  3:13 PM.       ALLERGIES Allergies  Allergen Reactions   Codeine Itching   Penicillins Itching, Rash and Other (See Comments)    Has patient had a PCN reaction causing immediate rash, facial/tongue/throat swelling, SOB or lightheadedness with hypotension: Yes Has patient had a PCN reaction causing severe rash involving mucus membranes or skin necrosis: No Has patient had a PCN reaction that required hospitalization: No Has patient had a PCN reaction occurring within the last 10 years: No If all of the above answers are "NO", then may proceed with Cephalosporin use.   Doxazosin     Heavy feeling in chest, congestion, wheezing   Doxycycline Other (See Comments)    Cause chest tightness   Hydralazine     Felt bad and could hear pulse in head     PAST MEDICAL HISTORY Past Medical History:  Diagnosis Date   Atrial fibrillation ([place]) 2017   a. s/p DCCV in 10/2015  b.  recurrent in 08/2016 --> rate-control pursued.    Cancer ([place])    Breast   CKD (chronic kidney disease)    GCA (giant cell arteritis) (HCC)    Hordeolum internum left lower eyelid 06/28/2019   Hypertension    Macular degeneration    Osteoporosis    PMR (polymyalgia rheumatica) (HCC)    Resistant hypertension 03/15/2019   Shortness of breath 03/15/2019   Skin cancer    Past Surgical History:  Procedure Laterality Date   CARDIOVERSION N/A 10/18/2015   Procedure: CARDIOVERSION;  Surgeon: Jerline Pain, MD;  Location: [place];  Service:  Cardiovascular;  Laterality: N/A;   CARDIOVERSION N/A 02/20/2017   Procedure: CARDIOVERSION;  Surgeon: Pixie Casino, MD;  Location: [facility];  Service: Cardiovascular;  Laterality: N/A;   CARDIOVERSION N/A 06/18/2017   Procedure: CARDIOVERSION;  Surgeon: Sanda Klein, MD;  Location: [facility] ENDOSCOPY;  Service: Cardiovascular;  Laterality: N/A;   CARDIOVERSION N/A 12/21/2018   Procedure: CARDIOVERSION;  Surgeon: Thayer Headings, MD;  Location: [facility] ENDOSCOPY;  Service: Cardiovascular;  Laterality: N/A;   KNEE SURGERY  2013   MASTECTOMY  1998   left side   PACEMAKER IMPLANT N/A 07/22/2019   Procedure: PACEMAKER IMPLANT;  Surgeon: Evans Lance, MD;  Location: [place] CV LAB;  Service: Cardiovascular;  Laterality: N/A;    FAMILY HISTORY Family History  Problem Relation Age of Onset   Stroke Mother    Kidney failure Father        died at 41   Heart disease Sister        died at 51   Heart disease Sister        died at 78   Breast cancer Daughter     SOCIAL HISTORY Social History   Tobacco Use   Smoking status: Former    Types: Cigarettes    Quit date: 08/31/1966    Years since quitting: 54.4   Smokeless tobacco: Never  Vaping Use   Vaping Use: Never used  Substance Use Topics   Alcohol use: No   Drug use: No         OPHTHALMIC EXAM:  Base Eye Exam     Visual Acuity (ETDRS)       Right Left   Dist cc 20/30 -1 CF at 2'         Tonometry (Tonopen, 3:13 PM)       Right Left   Pressure 11 7         Pupils       Pupils   Right PERRL   Left PERRL         Visual Fields       Left Right     Full   Restrictions Partial inner superior temporal, inferior temporal, superior nasal, inferior nasal deficiencies          Extraocular Movement       Right Left    Full Full         Neuro/Psych     Oriented x3: Yes   Mood/Affect: Normal         Dilation     Both eyes: 1.0% Mydriacyl, 2.5% Phenylephrine @ 3:12 PM            Slit Lamp and Fundus Exam     External Exam       Right Left   External Normal Normal         Slit Lamp Exam  Right Left   Lids/Lashes Normal Normal   Conjunctiva/Sclera White and quiet White and quiet   Cornea Clear Clear   Anterior Chamber Deep and quiet Deep and quiet   Iris Round and reactive Round and reactive   Lens Posterior chamber intraocular lens, Open posterior capsule Posterior chamber intraocular lens, Open posterior capsule   Anterior Vitreous Normal Normal         Fundus Exam       Right Left   Posterior Vitreous Normal Normal   Disc Normal Normal   C/D Ratio 0.45 0.5   Macula Geographic atrophy approaching nasal FAZ, Advanced age related macular degeneration, Disciform scar nasal to faz, , Drusen, Retinal pigment epithelial mottling Geographic atrophy, Advanced age related macular degeneration, Disciform scar, Drusen, Retinal pigment epithelial mottling   Vessels Normal Normal   Periphery Normal Normal            IMAGING AND PROCEDURES  Imaging and Procedures for 01/28/21  Intravitreal Injection, Pharmacologic Agent - OD - Right Eye       Time Out 01/28/2021. 4:02 PM. Confirmed correct patient, procedure, site, and patient consented.   Anesthesia Topical anesthesia was used. Anesthetic medications included Lidocaine 4%.   Procedure Preparation included Tobramycin 0.3%, 10% betadine to eyelids, 5% betadine to ocular surface, Ofloxacin . A 30 gauge needle was used.   Injection: 2 mg aflibercept 2 MG/0.05ML   Route: Intravitreal, Site: Right Eye   NDC: [id number], Lot: [id number], Waste: 0 mL   Post-op Post injection exam found visual acuity of at least counting fingers. The patient tolerated the procedure well. There were no complications. The patient received written and verbal post procedure care education. Post injection medications included ocuflox.      OCT, Retina - OU - Both Eyes       Right Eye Quality was good. Scan  locations included subfoveal. Central Foveal Thickness: 380. Progression has improved. Findings include intraretinal fluid, vitreomacular adhesion , pigment epithelial detachment, subretinal scarring, disciform scar.   Left Eye Quality was good. Scan locations included subfoveal. Central Foveal Thickness: 307. Progression has been stable. Findings include no SRF, retinal drusen , outer retinal atrophy, central retinal atrophy, subretinal hyper-reflective material, subretinal scarring.   Notes Pigment epithelial detachment with intraretinal fluid nasal to the fovea OD is stable,, at 12-week interval follow-up today, repeat injection Eylea today examination next 8 weeks  OS with advanced dry atrophic ARMD stable, no change in thickness OS, the computer selected lower baseline             ASSESSMENT/PLAN:  Advanced nonexudative age-related macular degeneration of right eye without subfoveal involvement Geographic atrophy encroaching near FAZ in the p.m. bundle  Exudative age-related macular degeneration of right eye with active choroidal neovascularization (HCC) Stable currently at 12-week interval.  We will maintain 12-week interval follow-up  Vitreomacular traction syndrome of right eye The nature of wet macular degeneration was discussed with the patient.  Forms of therapy reviewed include the use of Anti-VEGF medications injected painlessly into the eye, as well as other possible treatment modalities, including thermal laser therapy. Fellow eye involvement and risks were discussed with the patient. Upon the finding of wet age related macular degeneration, treatment will be offered. The treatment regimen is on a treat as needed basis with the intent to treat if necessary and extend interval of exams when possible. On average 1 out of 6 patients do not need lifetime therapy. However, the risk of recurrent disease is high for a  lifetime.  Initially monthly, then periodic, examinations and  evaluations will determine whether the next treatment is required on the day of the examination.  OD vastly improved and now stable on intravitreal Eylea current 10-week interval.  Repeat injection today follow-up next in 12 weeks  Advanced nonexudative age-related macular degeneration of left eye with subfoveal involvement OS, geographic atrophy involves the fovea observe     ICD-10-CM   1. Exudative age-related macular degeneration of right eye with active choroidal neovascularization (HCC)  H35.3211 Intravitreal Injection, Pharmacologic Agent - OD - Right Eye    OCT, Retina - OU - Both Eyes    aflibercept (EYLEA) SOLN 2 mg    2. Advanced nonexudative age-related macular degeneration of right eye without subfoveal involvement  H35.3113     3. Vitreomacular traction syndrome of right eye  H43.821     4. Advanced nonexudative age-related macular degeneration of left eye with subfoveal involvement  H35.3124       1.  OD vastly improved and stabilized acuity.  At 12-week interval today post Eylea.  We will maintain at 12 weeks and this monocular patient  2.  Right AMD encroaching near the FAZ OD, not involving the FAZ  3.  OS, subfoveal dry atrophic AMD accounting for acuity no sign of CNVM  Ophthalmic Meds Ordered this visit:  Meds ordered this encounter  Medications   aflibercept (EYLEA) SOLN 2 mg       Return in about 12 weeks (around 04/22/2021) for dilate, OD, EYLEA OCT.  There are no Patient Instructions on file for this visit.   Explained the diagnoses, plan, and follow up with the patient and they expressed understanding.  Patient expressed understanding of the importance of proper follow up care.   Clent Demark Azia Toutant M.D. Diseases & Surgery of the Retina and Vitreous [facility] [place] 01/28/21     Abbreviations: M myopia (nearsighted); A astigmatism; H hyperopia (farsighted); P presbyopia; Mrx spectacle prescription;  CTL contact lenses; OD right eye;  OS left eye; OU both eyes  XT exotropia; ET esotropia; PEK punctate epithelial keratitis; PEE punctate epithelial erosions; DES dry eye syndrome; MGD meibomian gland dysfunction; ATs artificial tears; PFAT's preservative free artificial tears; [place] nuclear sclerotic cataract; PSC posterior subcapsular cataract; ERM epi-retinal membrane; PVD posterior vitreous detachment; RD retinal detachment; DM diabetes mellitus; DR diabetic retinopathy; NPDR non-proliferative diabetic retinopathy; PDR proliferative diabetic retinopathy; CSME clinically significant macular edema; DME diabetic macular edema; dbh dot blot hemorrhages; CWS cotton wool spot; POAG primary open angle glaucoma; C/D cup-to-disc ratio; HVF humphrey visual field; GVF goldmann visual field; OCT optical coherence tomography; IOP intraocular pressure; BRVO Branch retinal vein occlusion; CRVO central retinal vein occlusion; CRAO central retinal artery occlusion; BRAO branch retinal artery occlusion; RT retinal tear; SB scleral buckle; PPV pars plana vitrectomy; VH Vitreous hemorrhage; PRP panretinal laser photocoagulation; IVK intravitreal kenalog; VMT vitreomacular traction; MH Macular hole;  NVD neovascularization of the disc; NVE neovascularization elsewhere; AREDS age related eye disease study; ARMD age related macular degeneration; POAG primary open angle glaucoma; EBMD epithelial/anterior basement membrane dystrophy; ACIOL anterior chamber intraocular lens; IOL intraocular lens; PCIOL posterior chamber intraocular lens; Phaco/IOL phacoemulsification with intraocular lens placement; [place] photorefractive keratectomy; LASIK laser assisted in situ keratomileusis; HTN hypertension; DM diabetes mellitus; COPD chronic obstructive pulmonary disease

## 2021-01-28 NOTE — Assessment & Plan Note (Signed)
Geographic atrophy encroaching near FAZ in the p.m. bundle

## 2021-01-28 NOTE — Assessment & Plan Note (Signed)
Stable currently at 12-week interval.  We will maintain 12-week interval follow-up

## 2021-01-28 NOTE — Assessment & Plan Note (Signed)
OS, geographic atrophy involves the fovea observe

## 2021-01-28 NOTE — Assessment & Plan Note (Signed)
The nature of wet macular degeneration was discussed with the patient.  Forms of therapy reviewed include the use of Anti-VEGF medications injected painlessly into the eye, as well as other possible treatment modalities, including thermal laser therapy. Fellow eye involvement and risks were discussed with the patient. Upon the finding of wet age related macular degeneration, treatment will be offered. The treatment regimen is on a treat as needed basis with the intent to treat if necessary and extend interval of exams when possible. On average 1 out of 6 patients do not need lifetime therapy. However, the risk of recurrent disease is high for a lifetime.  Initially monthly, then periodic, examinations and evaluations will determine whether the next treatment is required on the day of the examination.  OD vastly improved and now stable on intravitreal Eylea current 10-week interval.  Repeat injection today follow-up next in 12 weeks

## 2021-01-28 NOTE — Telephone Encounter (Signed)
Spoke with patient who is aware to hold Digoxin the day of office visit with [name]  @ 2.20 01-29-21 until after visit due lab work being drawn.

## 2021-01-29 ENCOUNTER — Ambulatory Visit (INDEPENDENT_AMBULATORY_CARE_PROVIDER_SITE_OTHER): Payer: Medicare Other | Admitting: Physician Assistant

## 2021-01-29 ENCOUNTER — Encounter: Payer: Self-pay | Admitting: Physician Assistant

## 2021-01-29 VITALS — BP 118/64 | HR 60 | Ht 65.0 in | Wt 133.2 lb

## 2021-01-29 DIAGNOSIS — I4811 Longstanding persistent atrial fibrillation: Secondary | ICD-10-CM

## 2021-01-29 DIAGNOSIS — I495 Sick sinus syndrome: Secondary | ICD-10-CM

## 2021-01-29 DIAGNOSIS — I10 Essential (primary) hypertension: Secondary | ICD-10-CM

## 2021-01-29 DIAGNOSIS — I484 Atypical atrial flutter: Secondary | ICD-10-CM | POA: Diagnosis not present

## 2021-01-29 DIAGNOSIS — Z79899 Other long term (current) drug therapy: Secondary | ICD-10-CM

## 2021-01-29 DIAGNOSIS — Z950 Presence of cardiac pacemaker: Secondary | ICD-10-CM

## 2021-01-29 DIAGNOSIS — I4891 Unspecified atrial fibrillation: Secondary | ICD-10-CM | POA: Diagnosis not present

## 2021-01-29 LAB — CUP PACEART INCLINIC DEVICE CHECK
Date Time Interrogation Session: 20230124174156
Implantable Lead Implant Date: 20210716
Implantable Lead Implant Date: 20210716
Implantable Lead Location: 753859
Implantable Lead Location: 753860
Implantable Lead Model: 7840
Implantable Lead Model: 7841
Implantable Lead Serial Number: 1017229
Implantable Lead Serial Number: 1090224
Implantable Pulse Generator Implant Date: 20210716
Lead Channel Impedance Value: 665 Ohm
Lead Channel Impedance Value: 750 Ohm
Lead Channel Pacing Threshold Amplitude: 0.5 V
Lead Channel Pacing Threshold Amplitude: 0.8 V
Lead Channel Pacing Threshold Pulse Width: 0.4 ms
Lead Channel Pacing Threshold Pulse Width: 0.4 ms
Lead Channel Sensing Intrinsic Amplitude: 18.8 mV
Lead Channel Sensing Intrinsic Amplitude: 3.7 mV
Lead Channel Setting Pacing Amplitude: 2 V
Lead Channel Setting Pacing Amplitude: 2.5 V
Lead Channel Setting Pacing Pulse Width: 0.4 ms
Lead Channel Setting Sensing Sensitivity: 2.5 mV
Pulse Gen Serial Number: 547553

## 2021-01-29 NOTE — Patient Instructions (Signed)
Medication Instructions:   Your physician recommends that you continue on your current medications as directed. Please refer to the Current Medication list given to you today.  *If you need a refill on your cardiac medications before your next appointment, please call your pharmacy*   Lab Work:  BMET AND DIG LEVEL TODAY    If you have labs (blood work) drawn today and your tests are completely normal, you will receive your results only by: [place] (if you have MyChart) OR A paper copy in the mail If you have any lab test that is abnormal or we need to change your treatment, we will call you to review the results.   Testing/Procedures: NONE ORDERED  TODAY    Follow-Up: At [facility], you and your health needs are our priority.  As part of our continuing mission to provide you with exceptional heart care, we have created designated Provider Care Teams.  These Care Teams include your primary Cardiologist (physician) and Advanced Practice Providers (APPs -  Physician Assistants and Nurse Practitioners) who all work together to provide you with the care you need, when you need it.  We recommend signing up for the patient portal called "MyChart".  Sign up information is provided on this After Visit Summary.  MyChart is used to connect with patients for Virtual Visits (Telemedicine).  Patients are able to view lab/test results, encounter notes, upcoming appointments, etc.  Non-urgent messages can be sent to your provider as well.   To learn more about what you can do with MyChart, go to [email or web address].    Your next appointment:   6 month(s)  The format for your next appointment:   In Person  Provider:   Cristopher Peru, MD    Other Instructions

## 2021-01-29 NOTE — Progress Notes (Signed)
Remote pacemaker transmission.   

## 2021-01-30 LAB — BASIC METABOLIC PANEL
BUN/Creatinine Ratio: 17 (ref 12–28)
BUN: 26 mg/dL (ref 10–36)
CO2: 26 mmol/L (ref 20–29)
Calcium: 8.8 mg/dL (ref 8.7–10.3)
Chloride: 99 mmol/L (ref 96–106)
Creatinine, Ser: 1.55 mg/dL — ABNORMAL HIGH (ref 0.57–1.00)
Glucose: 81 mg/dL (ref 70–99)
Potassium: 4.5 mmol/L (ref 3.5–5.2)
Sodium: 141 mmol/L (ref 134–144)
eGFR: 31 mL/min/{1.73_m2} — ABNORMAL LOW (ref >59)

## 2021-01-30 LAB — DIGOXIN LEVEL: Digoxin, Serum: 0.8 ng/mL (ref 0.5–0.9)

## 2021-02-11 DIAGNOSIS — N319 Neuromuscular dysfunction of bladder, unspecified: Secondary | ICD-10-CM | POA: Diagnosis not present

## 2021-02-13 ENCOUNTER — Other Ambulatory Visit (HOSPITAL_COMMUNITY): Payer: Self-pay | Admitting: Urology

## 2021-02-13 DIAGNOSIS — N319 Neuromuscular dysfunction of bladder, unspecified: Secondary | ICD-10-CM

## 2021-02-14 ENCOUNTER — Other Ambulatory Visit: Payer: Self-pay

## 2021-02-14 ENCOUNTER — Encounter (HOSPITAL_BASED_OUTPATIENT_CLINIC_OR_DEPARTMENT_OTHER): Payer: Self-pay | Admitting: Cardiovascular Disease

## 2021-02-14 ENCOUNTER — Telehealth (HOSPITAL_COMMUNITY): Payer: Self-pay

## 2021-02-14 ENCOUNTER — Ambulatory Visit (INDEPENDENT_AMBULATORY_CARE_PROVIDER_SITE_OTHER): Payer: Medicare Other | Admitting: Cardiovascular Disease

## 2021-02-14 ENCOUNTER — Telehealth: Payer: Self-pay | Admitting: *Deleted

## 2021-02-14 ENCOUNTER — Other Ambulatory Visit (HOSPITAL_COMMUNITY): Payer: Self-pay | Admitting: Urology

## 2021-02-14 DIAGNOSIS — I4819 Other persistent atrial fibrillation: Secondary | ICD-10-CM

## 2021-02-14 DIAGNOSIS — I484 Atypical atrial flutter: Secondary | ICD-10-CM | POA: Diagnosis not present

## 2021-02-14 DIAGNOSIS — N184 Chronic kidney disease, stage 4 (severe): Secondary | ICD-10-CM

## 2021-02-14 DIAGNOSIS — I272 Pulmonary hypertension, unspecified: Secondary | ICD-10-CM | POA: Insufficient documentation

## 2021-02-14 DIAGNOSIS — R600 Localized edema: Secondary | ICD-10-CM | POA: Diagnosis not present

## 2021-02-14 DIAGNOSIS — I10 Essential (primary) hypertension: Secondary | ICD-10-CM | POA: Diagnosis not present

## 2021-02-14 HISTORY — DX: Pulmonary hypertension, unspecified: I27.20

## 2021-02-14 NOTE — Assessment & Plan Note (Signed)
Creatinine has been stable at 1.5.  She is euvolemic and doing well on Lasix.

## 2021-02-14 NOTE — Telephone Encounter (Signed)
-----   Message from Claude Manges, [place] sent at 02/14/2021  4:41 PM EST ----- Regarding: FW: CT IMAGE GUIDED DRAINAGE BY PERCUTANEOUS CATHETER Im trying to get this to Pre- op Pool Cant find  in search box Could you assist me with this to the pool?  ----- Message ----- From: Baldwin Jamaica, PA-C Sent: 02/14/2021   4:33 PM EST To: Claude Manges, CMA Subject: FW: CT IMAGE GUIDED DRAINAGE BY PERCUTANEOUS#  Please route to the pre-op pool and or pharmacist THANKS ----- Message ----- From: Sherran Needs, NP Sent: 02/14/2021   8:26 AM EST To: Baldwin Jamaica, PA-C Subject: FW: CT IMAGE [place], C Can you address this since you just saw her. I haven't seen since 2021. Thanks, Butch Penny  ----- Message ----- From: Garth Bigness D Sent: 02/13/2021   4:32 PM EST To: Sherran Needs, NP Subject: FW: CT IMAGE GUIDED DRAINAGE BY PERCUTANEOUS#  Good afternoon, Mrs Butch Penny, this patient has an order in for a Suprapubic catheter placement and she is on Eliquis and will need to hold for 2 days prior. Please advise if okay to hold for two days prior to procedure. Thanks Aniceto Boss  ----- Message ----- From: Garth Bigness D Sent: 02/13/2021   4:31 PM EST To: [facility] Procedure Requests Subject: CT IMAGE GUIDED DRAINAGE BY PERCUTANEOUS CAT#  Procedure:  CT IMAGE GUIDED DRAINAGE BY PERCUTANEOUS CATHETER  Reason:  Neuromuscular dysfunction of bladder, needs conversion of current SB Tube changeable council foley  History:  previous CT Drainage on 01/15/2021  Provider:  Remi Haggard  Provider Contact:  [phone]

## 2021-02-14 NOTE — Telephone Encounter (Signed)
Called pt regarding catheter upsize, no answer, left vm. AW

## 2021-02-14 NOTE — Patient Instructions (Addendum)
Medication Instructions:  Your physician recommends that you continue on your current medications as directed. Please refer to the Current Medication list given to you today.   *If you need a refill on your cardiac medications before your next appointment, please call your pharmacy*  Lab Work: NONE  Testing/Procedures: NONE  Follow-Up: At [facility], you and your health needs are our priority.  As part of our continuing mission to provide you with exceptional heart care, we have created designated Provider Care Teams.  These Care Teams include your primary Cardiologist (physician) and Advanced Practice Providers (APPs -  Physician Assistants and Nurse Practitioners) who all work together to provide you with the care you need, when you need it.  We recommend signing up for the patient portal called "MyChart".  Sign up information is provided on this After Visit Summary.  MyChart is used to connect with patients for Virtual Visits (Telemedicine).  Patients are able to view lab/test results, encounter notes, upcoming appointments, etc.  Non-urgent messages can be sent to your provider as well.   To learn more about what you can do with MyChart, go to [email or web address].    Your next appointment:   4 month(s)  The format for your next appointment:   In Person  Provider:   Skeet Latch, MD    Other Instructions MONITOR BLOOD PRESSURE TWICE A DAY, CALL WITH READING IN COUPLE WEEKS

## 2021-02-14 NOTE — Assessment & Plan Note (Signed)
Rates are well controlled since adding digoxin.  Her levels were therapeutic last week.  Continue Eliquis, carvedilol, amiodarone, and digoxin.

## 2021-02-14 NOTE — Progress Notes (Incomplete)
Cardiology Office Note:   Date:  02/14/2021   ID:  Allison Norman, DOB 03-Mar-1924, MRN [id number]  PCP:  [name], MD  Cardiologist:  Pixie Casino, MD    Referring MD: [name], MD   CC: Hypertension  History of Present Illness:    Allison Norman is a 88 y.o. female with a hx of paroxysmal atrial fibrillation, hypertension, and CKD IV here for follow up. She initially established care in the hypertension clinic 02/2019. She reports that she was diagnosed with hypertension several years ago. Prior to developing atrial fibrillation her BP was better controlled. Her medications were changed for improved rate control. She has been working with [name], PA-C for her hypertension lately. Amlodipine was increased to 10mg  but she developed LE edema, so amlodipine was reduced back to 5mg  and lasix was started. She only uses lasix sporadically. She noted that she sometimes forgets to take her evening dose of clonidine. At that time hydralazine was increased to 50 mg 3 times daily. She was also started on doxazosin. Metoprolol was switched to carvedilol. Doxazosin was discontinued due to shortness of breath and heaviness in her chest.  Of note, she had also stopped taking her furosemide. Once this restarted her breathing improved. Her blood pressure in the office seemed to be consistently elevated. When she last our pharmacist on 06/2019 her average blood pressure in the morning was 143/75 and 139/75 in the evenings. She was started on minoxidil and the dose was increased to 10 mg. Since that time she has stopped both hydralazine and doxazosin due to intolerances.  She was started on carvedilol 12.5 mg. She noted pulsing in her ears that didn't improve with flonase and Claritin. Her dose of carvedilol was reduced and the pulsing sensation in her ears improved. She was started on diltiazem but did not tolerate it due to lower extremity edema. Her clonidine was increased to 0.3mg  tid.  She had an abnormal pulmonary function test in 2019 but had never seen pulmonology. Carotid dopplers 01/2020 showed 1-39% stenosis bilaterally. She had an echo 01/2020 that revealed LVEF 65-70% with severe LVH and grade 3 diastolic dysfunction. It also showed severe bi-atrial enlargement and moderate to severe tricuspid regurgitation. Aortic stenosis was mild. PASP was 65mmHg. We reccommended increasing her Lasix to daily. She declined further evaluation of her severe pulmonary hypertension and valvular heart disease. She saw Dr. Lovena Le 11/2020 and digoxin was added.   Today, she is accompanied by a family member and not doing the best. She had a suprapubic catheter placed and having trouble with it. On Monday, she goes back to her urologist for a tube change. She had a lesion on her R leg that caused her to present to the [facility] ER. The would was constantly bleeding and she was unable to receive stiches. She was transferred to [facility] to receive closure strips. The wound healed well. She saw Dr. Lovena Le because she noticed her heart rate increasing. Her resting rate would increase to 100s bpm. He put her on digoxin which she takes every other day. She has not noticed her heart racing since. Her blood pressure at home can go as high as 503T systolic. She notices her pressure is more elevated at night. She restarted taking her afternoon clonidine to manage her high blood pressure. However, the clonidine causes her to have dry mouth. She has not needed to take her prednisone. For pain, she opts for Tylenol. She denies any  chest pain, or shortness of breath, lightheadedness, headaches, syncope, orthopnea, PND, lower extremity edema or exertional symptoms.  Previous antihypertensives: Amlodipine - edema at 10 mg Doxazosin Hydralazine Diltiazem - leg swelling  Past Medical History:  Diagnosis Date   Atrial fibrillation ([place]) 2017   a. s/p DCCV in 10/2015  b. recurrent in 08/2016 --> rate-control  pursued.    Cancer ([place])    Breast   CKD (chronic kidney disease)    GCA (giant cell arteritis) (HCC)    Hordeolum internum left lower eyelid 06/28/2019   Hypertension    Macular degeneration    Osteoporosis    PMR (polymyalgia rheumatica) (HCC)    Resistant hypertension 03/15/2019   Shortness of breath 03/15/2019   Skin cancer     Past Surgical History:  Procedure Laterality Date   CARDIOVERSION N/A 10/18/2015   Procedure: CARDIOVERSION;  Surgeon: Jerline Pain, MD;  Location: [facility];  Service: Cardiovascular;  Laterality: N/A;   CARDIOVERSION N/A 02/20/2017   Procedure: CARDIOVERSION;  Surgeon: Pixie Casino, MD;  Location: [place];  Service: Cardiovascular;  Laterality: N/A;   CARDIOVERSION N/A 06/18/2017   Procedure: CARDIOVERSION;  Surgeon: Sanda Klein, MD;  Location: [place] ENDOSCOPY;  Service: Cardiovascular;  Laterality: N/A;   CARDIOVERSION N/A 12/21/2018   Procedure: CARDIOVERSION;  Surgeon: Thayer Headings, MD;  Location: [facility] ENDOSCOPY;  Service: Cardiovascular;  Laterality: N/A;   KNEE SURGERY  2013   MASTECTOMY  1998   left side   PACEMAKER IMPLANT N/A 07/22/2019   Procedure: PACEMAKER IMPLANT;  Surgeon: Evans Lance, MD;  Location: [place] CV LAB;  Service: Cardiovascular;  Laterality: N/A;    Current Medications: Current Meds  Medication Sig   Aflibercept (EYLEA) 2 MG/0.05ML SOLN 2 mg by Intravitreal route every 3 (three) months. Inject in R eye every 11 weeks per Retina specialist for Macular Degenration   amiodarone (PACERONE) 200 MG tablet Take 1/2 (one-half) tablet by mouth once daily   apixaban (ELIQUIS) 2.5 MG TABS tablet Take 1 tablet by mouth twice daily   carvedilol (COREG) 12.5 MG tablet Take 1 tablet (12.5 mg total) by mouth 2 (two) times daily with a meal.   cloNIDine (CATAPRES) 0.2 MG tablet Take 1 tablet (0.2 mg total) by mouth 3 (three) times daily.   diazepam (VALIUM) 2 MG tablet Take 2 mg by mouth at bedtime.   digoxin (LANOXIN)  0.125 MG tablet Take 1 tablet (0.125 mg total) by mouth every other day.   furosemide (LASIX) 40 MG tablet Take 40 mg by mouth daily.   Melatonin 10 MG CAPS Take 10 mg by mouth at bedtime.   Multiple Vitamins-Minerals (PRESERVISION AREDS 2) CAPS Take 1 capsule by mouth daily.   pantoprazole (PROTONIX) 40 MG tablet Take 1 tablet (40 mg total) by mouth daily.   polyethylene glycol (MIRALAX / GLYCOLAX) 17 g packet Take 17 g by mouth daily. (Patient taking differently: Take 17 g by mouth daily as needed for moderate constipation.)     Allergies:   Codeine, Penicillins, Doxazosin, Doxycycline, and Hydralazine   Social History   Socioeconomic History   Marital status: Married    Spouse name: Not on file   Number of children: 2   Years of education: Not on file   Highest education level: Not on file  Occupational History   Not on file  Tobacco Use   Smoking status: Former    Types: Cigarettes    Quit date: 08/31/1966    Years since quitting:  54.4   Smokeless tobacco: Never  Vaping Use   Vaping Use: Never used  Substance and Sexual Activity   Alcohol use: No   Drug use: No   Sexual activity: Not on file  Other Topics Concern   Not on file  Social History Narrative   epworth sleepiness scale = 8 (08/31/15)   Social Determinants of Health   Financial Resource Strain: Not on file  Food Insecurity: Not on file  Transportation Needs: Not on file  Physical Activity: Not on file  Stress: Not on file  Social Connections: Not on file     Family History: The patient's family history includes Breast cancer in her daughter; Heart disease in her sister and sister; Kidney failure in her father; Stroke in her mother.  ROS:   Please see the history of present illness.    (+) Dry mouth  All other systems reviewed and are negative.  EKGs/Labs/Other Studies Reviewed:    EKG:  EKG was not ordered today  Echo 01/30/20 1. Left ventricular ejection fraction, by estimation, is 65 to 70%. The   left ventricle has normal function. The left ventricle has no regional  wall motion abnormalities. There is severe concentric left ventricular  hypertrophy. Left ventricular diastolic   parameters are consistent with Grade III diastolic dysfunction  (restrictive). Elevated left atrial pressure.   2. Right ventricular systolic function is mildly reduced. The right  ventricular size is mildly enlarged. There is severely elevated pulmonary  artery systolic pressure. The estimated right ventricular systolic  pressure is 93.7 mmHg.   3. Left atrial size was severely dilated.   4. Right atrial size was severely dilated.   5. The mitral valve is normal in structure. Mild to moderate mitral valve  regurgitation. No evidence of mitral stenosis. Moderate mitral annular  calcification.   6. Tricuspid valve regurgitation is moderate to severe.   7. The aortic valve is normal in structure. There is severe calcifcation  of the aortic valve. There is severe thickening of the aortic valve.  Aortic valve regurgitation is not visualized. Mild aortic valve stenosis.  Aortic valve mean gradient measures  12.0 mmHg.   8. The inferior vena cava is normal in size with greater than 50%  respiratory variability, suggesting right atrial pressure of 3 mmHg.   Carotid Doppler 01/17/20 Right Carotid: Velocities in the right ICA are consistent with a 1-39%  stenosis. The ECA appears >50% stenosed.   Left Carotid: Velocities in the left ICA are consistent with a 1-39%  stenosis.   Vertebrals:  Bilateral vertebral arteries demonstrate antegrade flow.  Subclavians: Right subclavian artery flow was disturbed. Normal flow hemodynamics were seen in the left subclavian artery.  Tortuous right subclavian artery.   Recent Labs: 09/05/2020: B Natriuretic Peptide 874.2 09/06/2020: ALT 18; TSH 3.300 09/09/2020: Magnesium 2.3 01/15/2021: Hemoglobin 12.9; Platelets 234 01/29/2021: BUN 26; Creatinine, Ser 1.55; Potassium 4.5;  Sodium 141   Recent Lipid Panel    Component Value Date/Time   CHOL  07/04/2009 0418    81        ATP III CLASSIFICATION:  <200     mg/dL   Desirable  200-239  mg/dL   Borderline High  >=240    mg/dL   High          TRIG 64 07/04/2009 0418   HDL 18 (L) 07/04/2009 0418   CHOLHDL 4.5 07/04/2009 0418   VLDL 13 07/04/2009 0418   LDLCALC  07/04/2009 0418  50        Total Cholesterol/HDL:CHD Risk Coronary Heart Disease Risk Table                     Men   Women  1/2 Average Risk   3.4   3.3  Average Risk       5.0   4.4  2 X Average Risk   9.6   7.1  3 X Average Risk  23.4   11.0        Use the calculated Patient Ratio above and the CHD Risk Table to determine the patient's CHD Risk.        ATP III CLASSIFICATION (LDL):  <100     mg/dL   Optimal  100-129  mg/dL   Near or Above                    Optimal  130-159  mg/dL   Borderline  160-189  mg/dL   High  >190     mg/dL   Very High    Physical Exam:      Wt Readings from Last 3 Encounters:  02/14/21 129 lb 12.8 oz (58.9 kg)  01/29/21 133 lb 3.2 oz (60.4 kg)  01/15/21 128 lb (58.1 kg)    VS:  BP 128/66 (BP Location: Right Arm, Patient Position: Sitting, Cuff Size: Normal)    Pulse (!) 59    Ht 5\' 5"  (1.651 m)    Wt 129 lb 12.8 oz (58.9 kg)    SpO2 96%    BMI 21.60 kg/m  , BMI Body mass index is 21.6 kg/m. GENERAL:  Well appearing HEENT: Pupils equal round and reactive, fundi not visualized, oral mucosa unremarkable NECK:  No jugular venous distention, waveform within normal limits, carotid upstroke brisk and symmetric, no carotid bruits LUNGS:  Clear to auscultation bilaterally HEART:  RRR.  PMI not displaced or sustained,S1 and S2 within normal limits, no S3, no S4, no clicks, no rubs, III/VI systolic murmur at the RUSB ABD:  Flat, positive bowel sounds normal in frequency in pitch, no bruits, no rebound, no guarding, no midline pulsatile mass, no hepatomegaly, no splenomegaly EXT:  2 plus pulses throughout, no  edema, no cyanosis no clubbing SKIN:  No rashes no nodules NEURO:  Cranial nerves II through XII grossly intact, motor grossly intact throughout PSYCH:  Cognitively intact, oriented to person place and time   ASSESSMENT:    No diagnosis found.   PLAN:   No problem-specific Assessment & Plan notes found for this encounter.   # Resistant hypertension: Blood pressure remains poorly controlled despite being on several medications.  Her renal function limits options as do her intolerances.  She did not tolerate higher doses of carvedilol or diltiazem.  She notes that she did well on clonidine at higher doses in the past.  We will increase it to 0.3 mg 3 times a day.  Continue amlodipine, carvedilol, losartan, and minoxidil as ordered.  We discussed limiting salt and following the DASH diet.  Renal artery Dopplers were normal. She was encouraged to start back walking more with a walker/cane for stability.   Secondary Causes of Hypertension  Medications/Herbal: OCP, steroids, stimulants, antidepressants, weight loss medication, immune suppressants, NSAIDs, sympathomimetics, alcohol, caffeine, licorice, ginseng, St. John's wort, chemo (none identified)  Sleep Apnea: (testing not indicated)  Renal artery stenosis: Renal Doppler normal 03/2019 Hyperaldosteronism: hyperkalemia Hyper/hypothyroidism: TSH normal 05/2019 Pheochromocytoma: testing not indicated Cushing's syndrome: testing not indicated  Coarctation of the aorta: BP symmetric  # LE Edema: # Shortness of breath: Volume stable.  She is taking lasix only as needed.    # Murmur:   Minimal to medical murmur concerning for aortic stenosis.  She previously had aortic valve sclerosis in 2017.  We will check an echocardiogram.  # Carotid bruit: Patient reports a pulsing sensation in her ears.  She has a carotid bruit on exam.  Carotid Doppler.   Disposition: FU with Tiffany C. Oval Linsey, MD, [facility] in 4 months   Medication  Adjustments/Labs and Tests Ordered: Current medicines are reviewed at length with the patient today.  Concerns regarding medicines are outlined above.  No orders of the defined types were placed in this encounter.  No orders of the defined types were placed in this encounter.  I,Mykaella Javier,acting as a scribe for Skeet Latch, MD.,have documented all relevant documentation on the behalf of Skeet Latch, MD,as directed by  Skeet Latch, MD while in the presence of Skeet Latch, MD.  ***  Signed, Orion Crook  02/14/2021 3:56 PM    [place]

## 2021-02-14 NOTE — Assessment & Plan Note (Signed)
She has chronic diastolic heart failure and pulmonary hypertension.  We previously discussed work-up and she feels that she is too old and does not want any other interventions.  She is euvolemic and doing well.  Continue carvedilol and Lasix.  Renal function is stable.

## 2021-02-14 NOTE — Telephone Encounter (Signed)
This was sent to me in a staff message. Please see notes. I will forward to pre op pool as this seems to be where it was needing to go to per the notes.

## 2021-02-15 ENCOUNTER — Other Ambulatory Visit (HOSPITAL_COMMUNITY): Payer: Self-pay | Admitting: Nurse Practitioner

## 2021-02-15 NOTE — Telephone Encounter (Signed)
Patient with diagnosis of afib on Eliquis for anticoagulation.    Procedure: Suprapubic catheter placement  Date of procedure: TBD   CHA2DS2-VASc Score = 5   This indicates a 7.2% annual risk of stroke. The patient's score is based upon: CHF History: 1 HTN History: 1 Diabetes History: 0 Stroke History: 0 Vascular Disease History: 0 Age Score: 2 Gender Score: 1      CrCl 20 ml/min  Per office protocol, patient can hold Eliquis for 2 days prior to procedure.

## 2021-02-15 NOTE — Telephone Encounter (Signed)
Clinical pharmacist to review Eliquis 

## 2021-02-15 NOTE — Telephone Encounter (Signed)
Patient was seen by Dr. Oval Linsey yesterday.  MD to review, patient has upcoming suprapubic catheter placement for neuromuscular dysfunction of bladder.  Would you be okay with the patient to proceed?  Please forward your response to the P CV DIV PREOP

## 2021-02-18 ENCOUNTER — Ambulatory Visit (HOSPITAL_COMMUNITY): Admission: RE | Admit: 2021-02-18 | Payer: Medicare Other | Source: Ambulatory Visit

## 2021-02-20 NOTE — Telephone Encounter (Signed)
Covering preop today. As below, awaiting input from Dr. Oval Linsey (routed by Almyra Deforest on 2/10). OV from 2/9 not fully available yet. Dr. Oval Linsey - Please route response to P CV DIV PREOP (the pre-op pool). Thank you.

## 2021-02-21 ENCOUNTER — Other Ambulatory Visit: Payer: Self-pay | Admitting: *Deleted

## 2021-02-21 MED ORDER — AMIODARONE HCL 200 MG PO TABS: ORAL_TABLET | ORAL | 1 refills | Status: DC

## 2021-02-28 ENCOUNTER — Encounter (HOSPITAL_COMMUNITY): Payer: Self-pay

## 2021-02-28 ENCOUNTER — Other Ambulatory Visit: Payer: Self-pay

## 2021-02-28 ENCOUNTER — Ambulatory Visit (HOSPITAL_COMMUNITY)
Admission: RE | Admit: 2021-02-28 | Discharge: 2021-02-28 | Disposition: A | Discharge status: Home / Self Care | Payer: Medicare Other | Source: Ambulatory Visit | Attending: Urology | Admitting: Urology

## 2021-02-28 DIAGNOSIS — I4891 Unspecified atrial fibrillation: Secondary | ICD-10-CM | POA: Insufficient documentation

## 2021-02-28 DIAGNOSIS — Z7901 Long term (current) use of anticoagulants: Secondary | ICD-10-CM | POA: Insufficient documentation

## 2021-02-28 DIAGNOSIS — N189 Chronic kidney disease, unspecified: Secondary | ICD-10-CM | POA: Diagnosis not present

## 2021-02-28 DIAGNOSIS — R339 Retention of urine, unspecified: Secondary | ICD-10-CM | POA: Insufficient documentation

## 2021-02-28 DIAGNOSIS — Z87891 Personal history of nicotine dependence: Secondary | ICD-10-CM | POA: Insufficient documentation

## 2021-02-28 DIAGNOSIS — T83098A Other mechanical complication of other indwelling urethral catheter, initial encounter: Secondary | ICD-10-CM | POA: Diagnosis not present

## 2021-02-28 DIAGNOSIS — N319 Neuromuscular dysfunction of bladder, unspecified: Secondary | ICD-10-CM | POA: Diagnosis not present

## 2021-02-28 DIAGNOSIS — Z435 Encounter for attention to cystostomy: Secondary | ICD-10-CM | POA: Diagnosis not present

## 2021-02-28 DIAGNOSIS — I129 Hypertensive chronic kidney disease with stage 1 through stage 4 chronic kidney disease, or unspecified chronic kidney disease: Secondary | ICD-10-CM | POA: Diagnosis not present

## 2021-02-28 HISTORY — PX: IR CATHETER TUBE CHANGE: IMG717

## 2021-02-28 MED ORDER — MIDAZOLAM HCL 2 MG/2ML IJ SOLN
INTRAMUSCULAR | Status: AC
  Filled 2021-02-28: qty 2

## 2021-02-28 MED ORDER — SODIUM CHLORIDE 0.9% FLUSH: 5.0000 mL | Freq: Three times a day (TID) | INTRAVENOUS | Status: DC

## 2021-02-28 MED ORDER — IOHEXOL 300 MG/ML  SOLN
100.0000 mL | Freq: Once | INTRAMUSCULAR | Status: AC | PRN
  Administered 2021-02-28: 10 mL

## 2021-02-28 MED ORDER — LIDOCAINE HCL 1 % IJ SOLN
INTRAMUSCULAR | Status: AC
  Filled 2021-02-28: qty 20

## 2021-02-28 MED ORDER — MIDAZOLAM HCL 2 MG/2ML IJ SOLN
INTRAMUSCULAR | Status: AC | PRN
Start: 2021-02-28 — End: 2021-02-28
  Administered 2021-02-28: 1 mg via INTRAVENOUS
  Administered 2021-02-28 (×2): .5 mg via INTRAVENOUS

## 2021-02-28 MED ORDER — FENTANYL CITRATE (PF) 100 MCG/2ML IJ SOLN
INTRAMUSCULAR | Status: AC | PRN
  Administered 2021-02-28: 25 ug via INTRAVENOUS
  Administered 2021-02-28: 50 ug via INTRAVENOUS
  Administered 2021-02-28: 25 ug via INTRAVENOUS

## 2021-02-28 MED ORDER — FENTANYL CITRATE (PF) 100 MCG/2ML IJ SOLN
INTRAMUSCULAR | Status: AC
  Filled 2021-02-28: qty 2

## 2021-02-28 NOTE — Procedures (Signed)
Interventional Radiology Procedure Note  Procedure:   Image guided suprapubic catheter exchange.   New 73F foley catheter.  .  Complications: None Recommendations:  - DC 1 hr when goals met - Routine 73F foley exchanges can be performed at bedside, starting in 6- 8 weeks    Signed,  Cleven Jansma S. Earleen Newport, DO

## 2021-02-28 NOTE — H&P (Signed)
Chief Complaint: Patient was seen in consultation today for image guided SP catheter upsize at the request of [place]  Referring Physician(s): Newsome,George B  Supervising Physician: Corrie Mckusick  Patient Status: [facility] - Out-pt  History of Present Illness: Allison Norman is a 88 y.o. female with PMHs of History of a fib ( on eliquis), CKD, HTN, HTN, urinary retention.Patient presents for suprapubic catheter placement. Patient is being followed by [facility].   Patient is known to IR service for previous SP catheter placement on 1.10/23, performed by Dr. Earleen Newport.  She is due to routine upsize.   Patient laying in bed, not in acute distress.  Denise headache, fever, chills, shortness of breath, cough, chest pain, abdominal pain, nausea ,vomiting, and bleeding.   Past Medical History:  Diagnosis Date   Atrial fibrillation ([place]) 2017   a. s/p DCCV in 10/2015  b. recurrent in 08/2016 --> rate-control pursued.    Cancer ([place])    Breast   CKD (chronic kidney disease)    GCA (giant cell arteritis) (HCC)    Hordeolum internum left lower eyelid 06/28/2019   Hypertension    Macular degeneration    Osteoporosis    PMR (polymyalgia rheumatica) (HCC)    Pulmonary hypertension, unspecified ([place]) 02/14/2021   Resistant hypertension 03/15/2019   Shortness of breath 03/15/2019   Skin cancer     Past Surgical History:  Procedure Laterality Date   CARDIOVERSION N/A 10/18/2015   Procedure: CARDIOVERSION;  Surgeon: Jerline Pain, MD;  Location: [place];  Service: Cardiovascular;  Laterality: N/A;   CARDIOVERSION N/A 02/20/2017   Procedure: CARDIOVERSION;  Surgeon: Pixie Casino, MD;  Location: [place];  Service: Cardiovascular;  Laterality: N/A;   CARDIOVERSION N/A 06/18/2017   Procedure: CARDIOVERSION;  Surgeon: Sanda Klein, MD;  Location: [facility];  Service: Cardiovascular;  Laterality: N/A;   CARDIOVERSION N/A 12/21/2018   Procedure: CARDIOVERSION;   Surgeon: Thayer Headings, MD;  Location: [facility] ENDOSCOPY;  Service: Cardiovascular;  Laterality: N/A;   KNEE SURGERY  2013   MASTECTOMY  1998   left side   PACEMAKER IMPLANT N/A 07/22/2019   Procedure: PACEMAKER IMPLANT;  Surgeon: Evans Lance, MD;  Location: [place] CV [facility];  Service: Cardiovascular;  Laterality: N/A;    Allergies: Codeine, Penicillins, Doxazosin, Doxycycline, and Hydralazine  Medications: Prior to Admission medications   Medication Sig Start Date End Date Taking? Authorizing Provider  acetaminophen (TYLENOL) 500 MG tablet Take 1,000 mg by mouth every 6 (six) hours as needed for moderate pain.   Yes [provider]  Aflibercept (EYLEA) 2 MG/0.05ML SOLN 2 mg by Intravitreal route every 3 (three) months. Inject in R eye every 11 weeks per Retina specialist for Macular Degenration   Yes [provider]  amiodarone (PACERONE) 200 MG tablet Take 1/2 (one-half) tablet by mouth once daily 02/21/21  Yes Evans Lance, MD  apixaban Arne [place]) 2.5 MG TABS tablet Take 1 tablet by mouth twice daily 11/14/20  Yes Sherran Needs, NP  carvedilol (COREG) 12.5 MG tablet Take 1 tablet (12.5 mg total) by mouth 2 (two) times daily with a meal. 11/08/20 02/28/21 Yes Skeet Latch, MD  cloNIDine (CATAPRES) 0.2 MG tablet Take 1 tablet (0.2 mg total) by mouth 3 (three) times daily. 01/17/21  Yes Skeet Latch, MD  diazepam (VALIUM) 2 MG tablet Take 2 mg by mouth at bedtime. 06/23/16  Yes [provider]  digoxin (LANOXIN) 0.125 MG tablet Take 1 tablet (0.125 mg total) by  mouth every other day. 11/26/20  Yes Evans Lance, MD  furosemide (LASIX) 40 MG tablet Take 40 mg by mouth daily.   Yes [provider]  Melatonin 10 MG CAPS Take 10 mg by mouth at bedtime.   Yes [provider]  Multiple Vitamins-Minerals (PRESERVISION AREDS 2) CAPS Take 1 capsule by mouth daily.   Yes [provider]  pantoprazole (PROTONIX) 40 MG tablet Take 1  tablet (40 mg total) by mouth daily. 09/09/20  Yes Kathie Dike, MD  polyethylene glycol (MIRALAX / GLYCOLAX) 17 g packet Take 17 g by mouth daily. Patient taking differently: Take 17 g by mouth daily as needed for moderate constipation. 09/09/20  Yes Kathie Dike, MD     Family History  Problem Relation Age of Onset   Stroke Mother    Kidney failure Father        died at 31   Heart disease Sister        died at 66   Heart disease Sister        died at 68   Breast cancer Daughter     Social History   Socioeconomic History   Marital status: Married    Spouse name: Not on file   Number of children: 2   Years of education: Not on file   Highest education level: Not on file  Occupational History   Not on file  Tobacco Use   Smoking status: Former    Types: Cigarettes    Quit date: 08/31/1966    Years since quitting: 54.5   Smokeless tobacco: Never  Vaping Use   Vaping Use: Never used  Substance and Sexual Activity   Alcohol use: No   Drug use: No   Sexual activity: Not on file  Other Topics Concern   Not on file  Social History Narrative   epworth sleepiness scale = 8 (08/31/15)   Social Determinants of Health   Financial Resource Strain: Not on file  Food Insecurity: Not on file  Transportation Needs: Not on file  Physical Activity: Not on file  Stress: Not on file  Social Connections: Not on file     Review of Systems: A 12 point ROS discussed and pertinent positives are indicated in the HPI above.  All other systems are negative.  Vital Signs: BP (!) 165/76    Pulse (!) 58    Temp 98.3 F (36.8 C) (Oral)    Resp 16    Ht 5' 5.5" (1.664 m)    Wt 128 lb (58.1 kg)    SpO2 94%    BMI 20.98 kg/m    Physical Exam Vitals reviewed.  Constitutional:      General: She is not in acute distress.    Appearance: Normal appearance. She is not ill-appearing.  HENT:     Head: Normocephalic and atraumatic.     Mouth/Throat:     Mouth: Mucous membranes are moist.   Cardiovascular:     Rate and Rhythm: Normal rate and regular rhythm.     Heart sounds: Normal heart sounds.  Pulmonary:     Effort: Pulmonary effort is normal.     Breath sounds: Normal breath sounds.  Abdominal:     General: Abdomen is flat. Bowel sounds are normal.     Palpations: Abdomen is soft.  Skin:    General: Skin is warm and dry.     Coloration: Skin is not jaundiced or pale.     Comments: + SP catheter  Neurological:     Mental Status: She is alert and oriented to person, place, and time.  Psychiatric:        Mood and Affect: Mood normal.        Behavior: Behavior normal.        Judgment: Judgment normal.    MD Evaluation Airway: WNL Heart: WNL Abdomen: WNL Chest/ Lungs: WNL ASA  Classification: 3 Mallampati/Airway Score: Two  Imaging: CUP PACEART INCLINIC DEVICE CHECK  Result Date: 01/29/2021 Pacemaker check in clinic. Normal device function. Thresholds, sensing, impedances consistent with previous measurements. Device programmed to maximize longevity. + mode switch, no high ventricular rates noted. Device programmed at appropriate safety margins. Histogram distribution appropriate for patient activity level. Device programmed to optimize intrinsic conduction. Estimated longevity _5 years__. Patient enrolled in remote follow-up. Patient education completed.   Labs:  CBC: Recent Labs    09/06/20 0403 09/07/20 1005 09/08/20 1034 01/15/21 0942  WBC 7.3 7.6 7.4 6.4  HGB 7.7* 7.9* 8.8* 12.9  HCT 24.4* 25.4* 27.2* 41.6  PLT 287 317 352 234    COAGS: Recent Labs    08/28/20 0634 01/15/21 0942  INR 1.5* 1.2  APTT 31  --     BMP: Recent Labs    09/06/20 0403 09/07/20 0401 09/08/20 1034 09/09/20 0344 01/29/21 1617  NA 132* 129* 126* 133* 141  K 4.3 4.8 4.4 4.2 4.5  CL 100 100 95* 103 99  CO2 24 24 23 24 26   GLUCOSE 92 95 84 88 81  BUN 28* 28* 22 26* 26  CALCIUM 7.8* 7.7* 8.0* 8.0* 8.8  CREATININE 1.63* 1.77* 1.52* 1.95* 1.55*  GFRNONAA  29* 26* 32* 23*  --     LIVER FUNCTION TESTS: Recent Labs    08/30/20 0346 08/31/20 0310 09/05/20 1530 09/06/20 0403  BILITOT 0.9 0.7 1.0 0.7  AST 40 31 25 20   ALT 21 17 20 18   ALKPHOS 79 75 93 79  PROT 5.5* 4.8* 6.2* 5.4*  ALBUMIN 3.0* 2.6* 3.2* 2.7*    TUMOR MARKERS: No results for input(s): AFPTM, CEA, CA199, CHROMGRNA in the last 8760 hours.  Assessment and Plan: 88 y.o. female with chronic urinary retention s/p suprapubic catheter placement By. Dr. Earleen Newport on 01/25/21, who presents to [facility] IR for routine upsize.   NPO since MN Hypertensive 165/76, not symptomatic  No need to hold AC/AP  Risks and benefits discussed with the patient including bleeding, infection, damage to adjacent structures, bowel perforation/fistula connection, and sepsis.   All of the patient's questions were answered, patient is agreeable to proceed. Consent signed and in chart.  Thank you for this interesting consult.  I greatly enjoyed meeting Allison Norman and look forward to participating in their care.  A copy of this report was sent to the requesting provider on this date.  Electronically Signed: Tera Mater, PA-C 02/28/2021, 11:15 AM   I spent a total of    25 Minutes in face to face in clinical consultation, greater than 50% of which was counseling/coordinating care for SP catheter upsize.   This chart was dictated using voice recognition software.  Despite best efforts to proofread,  errors can occur which can change the documentation meaning.

## 2021-03-01 DIAGNOSIS — N319 Neuromuscular dysfunction of bladder, unspecified: Secondary | ICD-10-CM | POA: Diagnosis not present

## 2021-03-01 DIAGNOSIS — R338 Other retention of urine: Secondary | ICD-10-CM | POA: Diagnosis not present

## 2021-03-01 DIAGNOSIS — R310 Gross hematuria: Secondary | ICD-10-CM | POA: Diagnosis not present

## 2021-03-01 NOTE — Telephone Encounter (Signed)
Preoperative team, please verify the fax number for this provider.  Thank you for your help.  Jossie Ng. Cherye Gaertner NP-C    03/01/2021, 4:49 PM [address] Office [phone] Fax [phone]

## 2021-03-01 NOTE — Telephone Encounter (Signed)
° °  Primary Cardiologist: Pixie Casino, MD  Chart reviewed as part of pre-operative protocol coverage. Given past medical history and time since last visit, based on ACC/AHA guidelines, DORA SIMEONE would be at acceptable risk for the planned procedure without further cardiovascular testing.   Patient with diagnosis of afib on Eliquis for anticoagulation.     Procedure: Suprapubic catheter placement  Date of procedure: TBD     CHA2DS2-VASc Score = 5   This indicates a 7.2% annual risk of stroke. The patient's score is based upon: CHF History: 1 HTN History: 1 Diabetes History: 0 Stroke History: 0 Vascular Disease History: 0 Age Score: 2 Gender Score: 1       CrCl 20 ml/min   Per office protocol, patient can hold Eliquis for 2 days prior to procedure.  I will route this recommendation to the requesting party via Epic fax function and remove from pre-op pool.  Please call with questions.  Jossie Ng. Dan Scearce NP-C    03/01/2021, 4:46 PM [place] [address] Office [phone] Fax [phone]

## 2021-03-01 NOTE — Telephone Encounter (Signed)
[facility] and got their fax number. It is  [phone].

## 2021-03-08 DIAGNOSIS — N319 Neuromuscular dysfunction of bladder, unspecified: Secondary | ICD-10-CM | POA: Diagnosis not present

## 2021-03-08 DIAGNOSIS — R338 Other retention of urine: Secondary | ICD-10-CM | POA: Diagnosis not present

## 2021-03-13 ENCOUNTER — Ambulatory Visit (HOSPITAL_BASED_OUTPATIENT_CLINIC_OR_DEPARTMENT_OTHER): Payer: Medicare Other | Admitting: General Practice

## 2021-03-16 DIAGNOSIS — R059 Cough, unspecified: Secondary | ICD-10-CM | POA: Diagnosis not present

## 2021-03-16 DIAGNOSIS — Z03818 Encounter for observation for suspected exposure to other biological agents ruled out: Secondary | ICD-10-CM | POA: Diagnosis not present

## 2021-03-22 ENCOUNTER — Encounter (HOSPITAL_BASED_OUTPATIENT_CLINIC_OR_DEPARTMENT_OTHER): Payer: Self-pay | Admitting: Cardiovascular Disease

## 2021-03-22 NOTE — Assessment & Plan Note (Signed)
She reports that her home blood pressures are uncontrolled but it is well controlled today.  She is going to track her blood pressures for 2 weeks and call us with the results.  For now continue carvedilol, clonidine, and Lasix.  She has not tolerated amlodipine, doxazosin, hydralazine, or diltiazem.  May consider a low-dose ARB if we need to. ?

## 2021-03-22 NOTE — Assessment & Plan Note (Signed)
Her volume status is stable.  She takes Lasix only as needed. ?

## 2021-03-22 NOTE — Assessment & Plan Note (Signed)
Rates are well controlled.  Continue amiodarone, digoxin, carvedilol, and Eliquis. ?

## 2021-03-23 DIAGNOSIS — J069 Acute upper respiratory infection, unspecified: Secondary | ICD-10-CM | POA: Diagnosis not present

## 2021-03-24 ENCOUNTER — Emergency Department (HOSPITAL_BASED_OUTPATIENT_CLINIC_OR_DEPARTMENT_OTHER): Payer: Medicare Other

## 2021-03-24 ENCOUNTER — Encounter (HOSPITAL_COMMUNITY): Payer: Self-pay | Admitting: Internal Medicine

## 2021-03-24 ENCOUNTER — Inpatient Hospital Stay (HOSPITAL_BASED_OUTPATIENT_CLINIC_OR_DEPARTMENT_OTHER)
Admission: EM | Admit: 2021-03-24 | Discharge: 2021-03-28 | DRG: 175 | Disposition: A | Discharge status: Home Health | Payer: Medicare Other | Attending: Internal Medicine | Admitting: Internal Medicine

## 2021-03-24 ENCOUNTER — Other Ambulatory Visit: Payer: Self-pay

## 2021-03-24 DIAGNOSIS — J189 Pneumonia, unspecified organism: Secondary | ICD-10-CM | POA: Diagnosis not present

## 2021-03-24 DIAGNOSIS — R195 Other fecal abnormalities: Secondary | ICD-10-CM | POA: Diagnosis not present

## 2021-03-24 DIAGNOSIS — I1A0 Resistant hypertension: Secondary | ICD-10-CM | POA: Diagnosis present

## 2021-03-24 DIAGNOSIS — J9601 Acute respiratory failure with hypoxia: Secondary | ICD-10-CM

## 2021-03-24 DIAGNOSIS — M353 Polymyalgia rheumatica: Secondary | ICD-10-CM | POA: Diagnosis present

## 2021-03-24 DIAGNOSIS — Z880 Allergy status to penicillin: Secondary | ICD-10-CM | POA: Diagnosis not present

## 2021-03-24 DIAGNOSIS — I130 Hypertensive heart and chronic kidney disease with heart failure and stage 1 through stage 4 chronic kidney disease, or unspecified chronic kidney disease: Secondary | ICD-10-CM | POA: Diagnosis present

## 2021-03-24 DIAGNOSIS — Z881 Allergy status to other antibiotic agents status: Secondary | ICD-10-CM

## 2021-03-24 DIAGNOSIS — I480 Paroxysmal atrial fibrillation: Secondary | ICD-10-CM | POA: Diagnosis present

## 2021-03-24 DIAGNOSIS — M810 Age-related osteoporosis without current pathological fracture: Secondary | ICD-10-CM | POA: Diagnosis present

## 2021-03-24 DIAGNOSIS — I272 Pulmonary hypertension, unspecified: Secondary | ICD-10-CM | POA: Diagnosis not present

## 2021-03-24 DIAGNOSIS — H3530 Unspecified macular degeneration: Secondary | ICD-10-CM | POA: Diagnosis present

## 2021-03-24 DIAGNOSIS — Z885 Allergy status to narcotic agent status: Secondary | ICD-10-CM | POA: Diagnosis not present

## 2021-03-24 DIAGNOSIS — M316 Other giant cell arteritis: Secondary | ICD-10-CM | POA: Diagnosis present

## 2021-03-24 DIAGNOSIS — E43 Unspecified severe protein-calorie malnutrition: Secondary | ICD-10-CM | POA: Insufficient documentation

## 2021-03-24 DIAGNOSIS — R0902 Hypoxemia: Principal | ICD-10-CM

## 2021-03-24 DIAGNOSIS — N3289 Other specified disorders of bladder: Secondary | ICD-10-CM | POA: Diagnosis not present

## 2021-03-24 DIAGNOSIS — J9621 Acute and chronic respiratory failure with hypoxia: Secondary | ICD-10-CM | POA: Diagnosis present

## 2021-03-24 DIAGNOSIS — R7989 Other specified abnormal findings of blood chemistry: Secondary | ICD-10-CM

## 2021-03-24 DIAGNOSIS — Z85828 Personal history of other malignant neoplasm of skin: Secondary | ICD-10-CM

## 2021-03-24 DIAGNOSIS — Z7901 Long term (current) use of anticoagulants: Secondary | ICD-10-CM | POA: Diagnosis not present

## 2021-03-24 DIAGNOSIS — J810 Acute pulmonary edema: Secondary | ICD-10-CM

## 2021-03-24 DIAGNOSIS — R0602 Shortness of breath: Secondary | ICD-10-CM | POA: Diagnosis not present

## 2021-03-24 DIAGNOSIS — Z8249 Family history of ischemic heart disease and other diseases of the circulatory system: Secondary | ICD-10-CM

## 2021-03-24 DIAGNOSIS — N184 Chronic kidney disease, stage 4 (severe): Secondary | ICD-10-CM | POA: Diagnosis present

## 2021-03-24 DIAGNOSIS — N1832 Chronic kidney disease, stage 3b: Secondary | ICD-10-CM | POA: Diagnosis present

## 2021-03-24 DIAGNOSIS — I4821 Permanent atrial fibrillation: Secondary | ICD-10-CM | POA: Diagnosis present

## 2021-03-24 DIAGNOSIS — R911 Solitary pulmonary nodule: Secondary | ICD-10-CM | POA: Diagnosis not present

## 2021-03-24 DIAGNOSIS — R778 Other specified abnormalities of plasma proteins: Secondary | ICD-10-CM

## 2021-03-24 DIAGNOSIS — Z79899 Other long term (current) drug therapy: Secondary | ICD-10-CM | POA: Diagnosis not present

## 2021-03-24 DIAGNOSIS — D509 Iron deficiency anemia, unspecified: Secondary | ICD-10-CM | POA: Diagnosis present

## 2021-03-24 DIAGNOSIS — I10 Essential (primary) hypertension: Secondary | ICD-10-CM | POA: Diagnosis present

## 2021-03-24 DIAGNOSIS — Z681 Body mass index (BMI) 19 or less, adult: Secondary | ICD-10-CM | POA: Diagnosis not present

## 2021-03-24 DIAGNOSIS — Z20822 Contact with and (suspected) exposure to covid-19: Secondary | ICD-10-CM | POA: Diagnosis not present

## 2021-03-24 DIAGNOSIS — Z950 Presence of cardiac pacemaker: Secondary | ICD-10-CM | POA: Diagnosis present

## 2021-03-24 DIAGNOSIS — I2699 Other pulmonary embolism without acute cor pulmonale: Principal | ICD-10-CM | POA: Diagnosis present

## 2021-03-24 DIAGNOSIS — I495 Sick sinus syndrome: Secondary | ICD-10-CM | POA: Diagnosis present

## 2021-03-24 DIAGNOSIS — D7389 Other diseases of spleen: Secondary | ICD-10-CM | POA: Diagnosis not present

## 2021-03-24 DIAGNOSIS — I5032 Chronic diastolic (congestive) heart failure: Secondary | ICD-10-CM | POA: Diagnosis not present

## 2021-03-24 DIAGNOSIS — Z86711 Personal history of pulmonary embolism: Secondary | ICD-10-CM | POA: Diagnosis present

## 2021-03-24 DIAGNOSIS — Z87891 Personal history of nicotine dependence: Secondary | ICD-10-CM

## 2021-03-24 HISTORY — DX: Acute respiratory failure with hypoxia: J96.01

## 2021-03-24 HISTORY — DX: Pneumonia, unspecified organism: J18.9

## 2021-03-24 LAB — CBC WITH DIFFERENTIAL/PLATELET
Abs Immature Granulocytes: 0.02 10*3/uL (ref 0.00–0.07)
Basophils Absolute: 0 10*3/uL (ref 0.0–0.1)
Basophils Relative: 0 %
Eosinophils Absolute: 0.2 10*3/uL (ref 0.0–0.5)
Eosinophils Relative: 2 %
HCT: 37.1 % (ref 36.0–46.0)
Hemoglobin: 11.8 g/dL — ABNORMAL LOW (ref 12.0–15.0)
Immature Granulocytes: 0 %
Lymphocytes Relative: 12 %
Lymphs Abs: 1.1 10*3/uL (ref 0.7–4.0)
MCH: 29.6 pg (ref 26.0–34.0)
MCHC: 31.8 g/dL (ref 30.0–36.0)
MCV: 93 fL (ref 80.0–100.0)
Monocytes Absolute: 1 10*3/uL (ref 0.1–1.0)
Monocytes Relative: 11 %
Neutro Abs: 6.8 10*3/uL (ref 1.7–7.7)
Neutrophils Relative %: 75 %
Platelets: 224 10*3/uL (ref 150–400)
RBC: 3.99 MIL/uL (ref 3.87–5.11)
RDW: 15.9 % — ABNORMAL HIGH (ref 11.5–15.5)
WBC: 9.1 10*3/uL (ref 4.0–10.5)
nRBC: 0 % (ref 0.0–0.2)

## 2021-03-24 LAB — TROPONIN I (HIGH SENSITIVITY)
Troponin I (High Sensitivity): 44 ng/L — ABNORMAL HIGH (ref <18)
Troponin I (High Sensitivity): <2 ng/L (ref <18)
Troponin I (High Sensitivity): 40 ng/L — ABNORMAL HIGH (ref <18)
Troponin I (High Sensitivity): 38 ng/L — ABNORMAL HIGH (ref <18)

## 2021-03-24 LAB — COMPREHENSIVE METABOLIC PANEL
ALT: 13 U/L (ref 0–44)
AST: 20 U/L (ref 15–41)
Albumin: 2.9 g/dL — ABNORMAL LOW (ref 3.5–5.0)
Alkaline Phosphatase: 93 U/L (ref 38–126)
Anion gap: 10 (ref 5–15)
BUN: 24 mg/dL — ABNORMAL HIGH (ref 8–23)
CO2: 27 mmol/L (ref 22–32)
Calcium: 7.9 mg/dL — ABNORMAL LOW (ref 8.9–10.3)
Chloride: 98 mmol/L (ref 98–111)
Creatinine, Ser: 1.46 mg/dL — ABNORMAL HIGH (ref 0.44–1.00)
GFR, Estimated: 33 mL/min — ABNORMAL LOW (ref >60)
Glucose, Bld: 164 mg/dL — ABNORMAL HIGH (ref 70–99)
Potassium: 3.7 mmol/L (ref 3.5–5.1)
Sodium: 135 mmol/L (ref 135–145)
Total Bilirubin: 0.7 mg/dL (ref 0.3–1.2)
Total Protein: 6.5 g/dL (ref 6.5–8.1)

## 2021-03-24 LAB — MRSA NEXT GEN BY PCR, NASAL: MRSA by PCR Next Gen: NOT DETECTED

## 2021-03-24 LAB — RESP PANEL BY RT-PCR (FLU A&B, COVID) ARPGX2
Influenza A by PCR: NEGATIVE
Influenza B by PCR: NEGATIVE
SARS Coronavirus 2 by RT PCR: NEGATIVE

## 2021-03-24 LAB — D-DIMER, QUANTITATIVE: D-Dimer, Quant: 1.18 ug/mL-FEU — ABNORMAL HIGH (ref 0.00–0.50)

## 2021-03-24 LAB — BASIC METABOLIC PANEL
Anion gap: 7 (ref 5–15)
BUN: 28 mg/dL — ABNORMAL HIGH (ref 8–23)
CO2: 31 mmol/L (ref 22–32)
Calcium: 8.8 mg/dL — ABNORMAL LOW (ref 8.9–10.3)
Chloride: 96 mmol/L — ABNORMAL LOW (ref 98–111)
Creatinine, Ser: 1.46 mg/dL — ABNORMAL HIGH (ref 0.44–1.00)
GFR, Estimated: 33 mL/min — ABNORMAL LOW (ref >60)
Glucose, Bld: 155 mg/dL — ABNORMAL HIGH (ref 70–99)
Potassium: 3.8 mmol/L (ref 3.5–5.1)
Sodium: 134 mmol/L — ABNORMAL LOW (ref 135–145)

## 2021-03-24 LAB — DIGOXIN LEVEL: Digoxin Level: 0.6 ng/mL — ABNORMAL LOW (ref 0.8–2.0)

## 2021-03-24 LAB — PHOSPHORUS: Phosphorus: 3.5 mg/dL (ref 2.5–4.6)

## 2021-03-24 LAB — MAGNESIUM: Magnesium: 2.3 mg/dL (ref 1.7–2.4)

## 2021-03-24 LAB — BRAIN NATRIURETIC PEPTIDE: B Natriuretic Peptide: 484.3 pg/mL — ABNORMAL HIGH (ref 0.0–100.0)

## 2021-03-24 LAB — PROCALCITONIN: Procalcitonin: <0.1 ng/mL

## 2021-03-24 MED ORDER — ALBUTEROL SULFATE (2.5 MG/3ML) 0.083% IN NEBU: 2.5000 mg | INHALATION_SOLUTION | RESPIRATORY_TRACT | Status: DC | PRN

## 2021-03-24 MED ORDER — ACETAMINOPHEN 650 MG RE SUPP: 650.0000 mg | Freq: Four times a day (QID) | RECTAL | Status: DC | PRN

## 2021-03-24 MED ORDER — SODIUM CHLORIDE 0.9 % IV SOLN
2.0000 g | INTRAVENOUS | Status: DC
  Administered 2021-03-25 – 2021-03-26 (×2): 2 g via INTRAVENOUS
  Filled 2021-03-24 (×3): qty 20

## 2021-03-24 MED ORDER — SODIUM CHLORIDE 0.9 % IV SOLN
2.0000 g | Freq: Every day | INTRAVENOUS | Status: DC
  Filled 2021-03-24: qty 20

## 2021-03-24 MED ORDER — CLONIDINE HCL 0.2 MG PO TABS
0.2000 mg | ORAL_TABLET | Freq: Three times a day (TID) | ORAL | Status: DC
  Administered 2021-03-24 – 2021-03-28 (×11): 0.2 mg via ORAL
  Filled 2021-03-24 (×11): qty 1

## 2021-03-24 MED ORDER — FUROSEMIDE 10 MG/ML IJ SOLN: 20.0000 mg | Freq: Once | INTRAMUSCULAR | Status: DC

## 2021-03-24 MED ORDER — IOHEXOL 350 MG/ML SOLN
100.0000 mL | Freq: Once | INTRAVENOUS | Status: AC | PRN
  Administered 2021-03-24: 60 mL via INTRAVENOUS

## 2021-03-24 MED ORDER — HEPARIN (PORCINE) 25000 UT/250ML-% IV SOLN
1000.0000 [IU]/h | INTRAVENOUS | Status: DC
  Administered 2021-03-24: 900 [IU]/h via INTRAVENOUS
  Filled 2021-03-24: qty 250

## 2021-03-24 MED ORDER — GUAIFENESIN ER 600 MG PO TB12
600.0000 mg | ORAL_TABLET | Freq: Two times a day (BID) | ORAL | Status: DC
  Administered 2021-03-24 – 2021-03-25 (×2): 600 mg via ORAL
  Filled 2021-03-24 (×2): qty 1

## 2021-03-24 MED ORDER — ACETAMINOPHEN 325 MG PO TABS
650.0000 mg | ORAL_TABLET | Freq: Four times a day (QID) | ORAL | Status: DC | PRN
  Administered 2021-03-27 – 2021-03-28 (×2): 650 mg via ORAL
  Filled 2021-03-24 (×3): qty 2

## 2021-03-24 MED ORDER — PANTOPRAZOLE SODIUM 40 MG PO TBEC
40.0000 mg | DELAYED_RELEASE_TABLET | Freq: Every day | ORAL | Status: DC
  Administered 2021-03-24 – 2021-03-28 (×5): 40 mg via ORAL
  Filled 2021-03-24 (×5): qty 1

## 2021-03-24 MED ORDER — DIAZEPAM 2 MG PO TABS
2.0000 mg | ORAL_TABLET | Freq: Every day | ORAL | Status: DC
  Administered 2021-03-24 – 2021-03-27 (×4): 2 mg via ORAL
  Filled 2021-03-24 (×4): qty 1

## 2021-03-24 MED ORDER — HYDROCODONE-ACETAMINOPHEN 5-325 MG PO TABS: 1.0000 | ORAL_TABLET | ORAL | Status: DC | PRN

## 2021-03-24 MED ORDER — POLYETHYLENE GLYCOL 3350 17 G PO PACK: 17.0000 g | PACK | Freq: Every day | ORAL | Status: DC | PRN

## 2021-03-24 MED ORDER — SODIUM CHLORIDE 0.9 % IV SOLN
500.0000 mg | Freq: Every day | INTRAVENOUS | Status: DC
  Administered 2021-03-25 – 2021-03-26 (×2): 500 mg via INTRAVENOUS
  Filled 2021-03-24 (×3): qty 5

## 2021-03-24 MED ORDER — AMIODARONE HCL 200 MG PO TABS
100.0000 mg | ORAL_TABLET | Freq: Every day | ORAL | Status: DC
  Administered 2021-03-25 – 2021-03-28 (×4): 100 mg via ORAL
  Filled 2021-03-24 (×4): qty 1

## 2021-03-24 MED ORDER — LEVOFLOXACIN IN D5W 750 MG/150ML IV SOLN
750.0000 mg | Freq: Once | INTRAVENOUS | Status: AC
  Administered 2021-03-24: 750 mg via INTRAVENOUS
  Filled 2021-03-24: qty 150

## 2021-03-24 NOTE — Assessment & Plan Note (Signed)
Chronic follow CBC ?

## 2021-03-24 NOTE — Progress Notes (Signed)
ANTICOAGULATION CONSULT NOTE - Initial Consult ? ?Pharmacy Consult for heparin ?Indication: pulmonary embolus ? ?Allergies  ?Allergen Reactions  ? Codeine Itching  ? Penicillins Itching, Rash and Other (See Comments)  ?  Has patient had a PCN reaction causing immediate rash, facial/tongue/throat swelling, SOB or lightheadedness with hypotension: Yes ?Has patient had a PCN reaction causing severe rash involving mucus membranes or skin necrosis: No ?Has patient had a PCN reaction that required hospitalization: No ?Has patient had a PCN reaction occurring within the last 10 years: No ?If all of the above answers are "NO", then may proceed with Cephalosporin use.  ? Doxazosin   ?  Heavy feeling in chest, congestion, wheezing  ? Doxycycline Other (See Comments)  ?  Cause chest tightness  ? Hydralazine   ?  Felt bad and could hear pulse in head   ? ? ?Patient Measurements: ?Height: 5' 5.5" (166.4 cm) ?Weight: 58.1 kg (128 lb 1.4 oz) ?IBW/kg (Calculated) : 58.15 ?Heparin Dosing Weight: TBW ? ?Vital Signs: ?Temp: 99 ?F (37.2 ?C) (03/19 1255) ?Temp Source: Temporal (03/19 1255) ?BP: 147/64 (03/19 1510) ?Pulse Rate: 59 (03/19 1510) ? ?Labs: ?Recent Labs  ?  03/24/21 ?1316  ?HGB 11.8*  ?HCT 37.1  ?PLT 224  ?CREATININE 1.46*  ?TROPONINIHS 44*  ? ? ?Estimated Creatinine Clearance: 21.6 mL/min (A) (by C-G formula based on SCr of 1.46 mg/dL (H)). ? ? ?Medical History: ?Past Medical History:  ?Diagnosis Date  ? Atrial fibrillation ([place]) 2017  ? a. s/p DCCV in 10/2015  b. recurrent in 08/2016 --> rate-control pursued.   ? Cancer [facility])   ? Breast  ? CKD (chronic kidney disease)   ? GCA (giant cell arteritis) ([place])   ? Hordeolum internum left lower eyelid 06/28/2019  ? Hypertension   ? Macular degeneration   ? Osteoporosis   ? PMR (polymyalgia rheumatica) (HCC)   ? Pulmonary hypertension, unspecified ([place]) 02/14/2021  ? Resistant hypertension 03/15/2019  ? Shortness of breath 03/15/2019  ? Skin cancer   ? ? ? ?Assessment: ?59 YOF presenting  with cough, SOB, CT angio chest showing nonocclusive thrombus, she is on Eliquis PTA for afib taken this AM.   ? ?Goal of Therapy:  ?Heparin level 0.3-0.7 units/ml ?aPTT 66-102 seconds ?Monitor platelets by anticoagulation protocol: Yes ?  ?Plan:  ?Heparin gtt at 900 units/hr, no bolus ?F/u 8 hour aPTT/HL ? ?Bertis Ruddy, PharmD ?Clinical Pharmacist ?ED Pharmacist Phone # [phone] ?03/24/2021 3:30 PM ? ? ? ?

## 2021-03-24 NOTE — Assessment & Plan Note (Signed)
Restart home medications as able ?

## 2021-03-24 NOTE — ED Triage Notes (Addendum)
Patient arrives via EMS from home with complaints of sore throat and cough x4-5 days. Patient has been evaluated by her PCP twice (testing negative for Covid/Flu).  ? ?Patient states that a family member checked her oxygen saturations and she was 88% on RA. Patient was placed on 2L of o2 en route with ems. She does not wear oxygen at baseline.  ? ?Arrives alert & oriented x4. Patient has a chronic Suprapubic Catheter in place as well.  ?

## 2021-03-24 NOTE — Progress Notes (Addendum)
Plan of Care Note for accepted transfer ? ? ?Patient: Allison Norman MRN: [id number]   DOA: 03/24/2021 ? ?Facility requesting transfer: [facility] ?Requesting Provider: EDP ?Reason for transfer: acute hypoxic respiratory failure /PE ?Facility course:  ? ?History of A-fib on Eliquis, chronic suprapubic catheter, present to ED due to nonproductive cough x4 days, she is found to have hypoxia 88% on room air, she is put on 2liter oxygen,  ?CTA positive for PE,  mediastinal mass, and possible pneumonia ? she is given one dose of levaquin, started on heparin drip, hospitalist call to admit the patient. ? ?Current vital signs in the ED: ?Blood pressure (!) 159/79, pulse (!) 58, temperature 99 ?F (37.2 ?C), temperature source Temporal, resp. rate 16, height 5' 5.5" (1.664 m), weight 58.1 kg, SpO2 95 % on 2liter [place] ? ? ?Plan of care: ?The patient is accepted for admission to Telemetry unit, at [facility]..  ? ? ?Author: ?Florencia Reasons, MD PhD FACP ?03/24/2021 ? ?Check [email or web address] for on-call coverage. ? ?Nursing staff, Please call [place] number on Amion as soon as patient's arrival, so appropriate admitting provider can evaluate the pt. ?

## 2021-03-24 NOTE — Assessment & Plan Note (Signed)
Status post pacemaker 

## 2021-03-24 NOTE — ED Provider Notes (Signed)
[facility] EMERGENCY DEPT Provider Note   CSN: [id number] Arrival date & time: 03/24/21  1245     History  Chief Complaint  Patient presents with   Cough   Low oxygen saturations    88% on RA    Allison Norman is a 88 y.o. female.  This is a 88 y.o. female with significant medical history as below, including atrial fibrillation, permanent pacemaker, pulmonary hypertension, who presents to the ED with complaint of cough, hypoxia  Patient reports 4 days of nonproductive cough.  Worse nighttime.  Took OTC Delsym which did improve her symptoms somewhat.  No dyspnea or chest pain.  No fevers, chills, nausea or vomiting.  No recent medication or diet changes.  She ambulates with a walker and her ambulatory status has not changed with onset of the symptoms.  No falls.  Compliant with DOAC. Cardiologist is Dr Rennis Golden       Past Medical History: 2017: Atrial fibrillation [facility])     Comment:  a. s/p DCCV in 10/2015  b. recurrent in 08/2016 -->               rate-control pursued.  No date: Cancer [facility])     Comment:  Breast No date: CKD (chronic kidney disease) No date: GCA (giant cell arteritis) (HCC) 06/28/2019: Hordeolum internum left lower eyelid No date: Hypertension No date: Macular degeneration No date: Osteoporosis No date: PMR (polymyalgia rheumatica) (HCC) 02/14/2021: Pulmonary hypertension, unspecified (HCC) 03/15/2019: Resistant hypertension 03/15/2019: Shortness of breath No date: Skin cancer  Past Surgical History: 10/18/2015: CARDIOVERSION; N/A     Comment:  Procedure: CARDIOVERSION;  Surgeon: Jake Bathe, MD;                Location: [facility] ENDOSCOPY;  Service: Cardiovascular;                Laterality: N/A; 02/20/2017: CARDIOVERSION; N/A     Comment:  Procedure: CARDIOVERSION;  Surgeon: Chrystie Nose,               MD;  Location: [facility] ENDOSCOPY;  Service: Cardiovascular;                Laterality: N/A; 06/18/2017: CARDIOVERSION; N/A     Comment:   Procedure: CARDIOVERSION;  Surgeon: Thurmon Fair, MD;              Location: [facility] ENDOSCOPY;  Service: Cardiovascular;                Laterality: N/A; 12/21/2018: CARDIOVERSION; N/A     Comment:  Procedure: CARDIOVERSION;  Surgeon: Vesta Mixer,               MD;  Location: [facility] ENDOSCOPY;  Service: Cardiovascular;                Laterality: N/A; 02/28/2021: IR CATHETER TUBE CHANGE 2013: KNEE SURGERY 1998: MASTECTOMY     Comment:  left side 07/22/2019: PACEMAKER IMPLANT; N/A     Comment:  Procedure: PACEMAKER IMPLANT;  Surgeon: Marinus Maw,              MD;  Location: [facility] INVASIVE CV LAB;  Service:               Cardiovascular;  Laterality: N/A;    The history is provided by the patient and the EMS personnel. No language interpreter was used.  Cough Associated symptoms: no chest pain, no chills, no fever, no headaches, no rash and no shortness of breath  Home Medications Prior to Admission medications   Medication Sig Start Date End Date Taking? Authorizing Provider  acetaminophen (TYLENOL) 500 MG tablet Take 1,000 mg by mouth every 6 (six) hours as needed for moderate pain.   Yes [provider]  Aflibercept (EYLEA) 2 MG/0.05ML SOLN 2 mg by Intravitreal route every 3 (three) months. Inject in R eye every 11 weeks per Retina specialist for Macular Degenration   Yes [provider]  amiodarone (PACERONE) 200 MG tablet Take 1/2 (one-half) tablet by mouth once daily Patient taking differently: Take 100 mg by mouth daily. 02/21/21  Yes Marinus Maw, MD  apixaban (ELIQUIS) 2.5 MG TABS tablet Take 1 tablet by mouth twice daily Patient taking differently: Take 2.5 mg by mouth 2 (two) times daily. 11/14/20  Yes Newman Nip, NP  carvedilol (COREG) 12.5 MG tablet Take 1 tablet (12.5 mg total) by mouth 2 (two) times daily with a meal. 11/08/20 03/24/21 Yes Chilton Si, MD  cloNIDine (CATAPRES) 0.2 MG tablet Take 1 tablet (0.2 mg total) by mouth 3 (three)  times daily. Patient taking differently: Take 0.2-0.3 mg by mouth 3 (three) times daily. Takes 0.2 mg in the morning 0.3 mg in the afternoon and night. 01/17/21  Yes Chilton Si, MD  dextromethorphan (DELSYM) 30 MG/5ML liquid Take 30 mg by mouth daily as needed for cough.   Yes [provider]  diazepam (VALIUM) 2 MG tablet Take 2 mg by mouth at bedtime as needed for anxiety. 06/23/16  Yes [provider]  digoxin (LANOXIN) 0.125 MG tablet Take 1 tablet (0.125 mg total) by mouth every other day. 11/26/20  Yes Marinus Maw, MD  furosemide (LASIX) 40 MG tablet Take 40 mg by mouth daily.   Yes [provider]  ipratropium (ATROVENT) 0.06 % nasal spray Place 2 sprays into both nostrils 2 (two) times daily. 03/23/21  Yes [provider]  Melatonin 10 MG CAPS Take 10 mg by mouth at bedtime.   Yes [provider]  Multiple Vitamins-Minerals (PRESERVISION AREDS 2) CAPS Take 1 capsule by mouth daily.   Yes [provider]  pantoprazole (PROTONIX) 40 MG tablet Take 1 tablet (40 mg total) by mouth daily. Patient taking differently: Take 40 mg by mouth every evening. 09/09/20  Yes Erick Blinks, MD  polyethylene glycol (MIRALAX / GLYCOLAX) 17 g packet Take 17 g by mouth daily. Patient taking differently: Take 17 g by mouth daily as needed for moderate constipation. 09/09/20  Yes Erick Blinks, MD      Allergies    Codeine, Penicillins, Doxazosin, Doxycycline, and Hydralazine    Review of Systems   Review of Systems  Constitutional:  Negative for chills and fever.  HENT:  Negative for facial swelling and trouble swallowing.   Eyes:  Negative for photophobia and visual disturbance.  Respiratory:  Positive for cough. Negative for shortness of breath.   Cardiovascular:  Negative for chest pain and palpitations.  Gastrointestinal:  Negative for abdominal pain, nausea and vomiting.  Endocrine: Negative for polydipsia and polyuria.  Genitourinary:   Negative for difficulty urinating and hematuria.  Musculoskeletal:  Negative for gait problem and joint swelling.  Skin:  Negative for pallor and rash.  Neurological:  Negative for syncope and headaches.  Psychiatric/Behavioral:  Negative for agitation and confusion.    Physical Exam Updated Vital Signs BP (!) 178/66 (BP Location: Right Arm)   Pulse 60   Temp 99 F (37.2 C) (Oral)   Resp 19   Ht  5' 5.5" (1.664 m)   Wt 55.2 kg   SpO2 93%   BMI 19.94 kg/m  Physical Exam Vitals and nursing note reviewed.  Constitutional:      Appearance: Normal appearance. She is not ill-appearing or toxic-appearing.  HENT:     Head: Normocephalic and atraumatic.     Right Ear: External ear normal.     Left Ear: External ear normal.     Nose: Nose normal.     Mouth/Throat:     Mouth: Mucous membranes are moist.  Eyes:     General: No scleral icterus.       Right eye: No discharge.        Left eye: No discharge.  Cardiovascular:     Rate and Rhythm: Normal rate and regular rhythm.     Pulses: Normal pulses.     Heart sounds: Normal heart sounds.  Pulmonary:     Effort: Pulmonary effort is normal. No tachypnea, accessory muscle usage or respiratory distress.     Breath sounds: Decreased breath sounds present. No wheezing.  Abdominal:     General: Abdomen is flat.     Palpations: Abdomen is soft.     Tenderness: There is no abdominal tenderness. There is no guarding or rebound.  Musculoskeletal:        General: Normal range of motion.     Cervical back: Normal range of motion.     Right lower leg: No edema.     Left lower leg: No edema.  Skin:    General: Skin is warm and dry.     Capillary Refill: Capillary refill takes less than 2 seconds.  Neurological:     Mental Status: She is alert.  Psychiatric:        Mood and Affect: Mood normal.        Behavior: Behavior normal.    ED Results / Procedures / Treatments   Labs (all labs ordered are listed, but only abnormal results are  displayed) Labs Reviewed  BASIC METABOLIC PANEL - Abnormal; Notable for the following components:      Result Value   Sodium 134 (*)    Chloride 96 (*)    Glucose, Bld 155 (*)    BUN 28 (*)    Creatinine, Ser 1.46 (*)    Calcium 8.8 (*)    GFR, Estimated 33 (*)    All other components within normal limits  CBC WITH DIFFERENTIAL/PLATELET - Abnormal; Notable for the following components:   Hemoglobin 11.8 (*)    RDW 15.9 (*)    All other components within normal limits  BRAIN NATRIURETIC PEPTIDE - Abnormal; Notable for the following components:   B Natriuretic Peptide 484.3 (*)    All other components within normal limits  D-DIMER, QUANTITATIVE - Abnormal; Notable for the following components:   D-Dimer, Quant 1.18 (*)    All other components within normal limits  URINALYSIS, ROUTINE W REFLEX MICROSCOPIC - Abnormal; Notable for the following components:   Hgb urine dipstick SMALL (*)    Protein, ur TRACE (*)    All other components within normal limits  COMPREHENSIVE METABOLIC PANEL - Abnormal; Notable for the following components:   Glucose, Bld 164 (*)    BUN 24 (*)    Creatinine, Ser 1.46 (*)    Calcium 7.9 (*)    Albumin 2.9 (*)    GFR, Estimated 33 (*)    All other components within normal limits  PREALBUMIN - Abnormal; Notable for the following  components:   Prealbumin 10.3 (*)    All other components within normal limits  COMPREHENSIVE METABOLIC PANEL - Abnormal; Notable for the following components:   Sodium 134 (*)    Glucose, Bld 111 (*)    BUN 30 (*)    Creatinine, Ser 1.66 (*)    Calcium 7.9 (*)    Albumin 2.9 (*)    GFR, Estimated 28 (*)    All other components within normal limits  DIGOXIN LEVEL - Abnormal; Notable for the following components:   Digoxin Level 0.6 (*)    All other components within normal limits  HEPARIN LEVEL (UNFRACTIONATED) - Abnormal; Notable for the following components:   Heparin Unfractionated >1.10 (*)    All other components  within normal limits  APTT - Abnormal; Notable for the following components:   aPTT 59 (*)    All other components within normal limits  CBC - Abnormal; Notable for the following components:   RBC 3.79 (*)    Hemoglobin 11.4 (*)    HCT 35.8 (*)    RDW 15.9 (*)    All other components within normal limits  URINALYSIS, MICROSCOPIC (REFLEX) - Abnormal; Notable for the following components:   Bacteria, UA RARE (*)    All other components within normal limits  TROPONIN I (HIGH SENSITIVITY) - Abnormal; Notable for the following components:   Troponin I (High Sensitivity) 44 (*)    All other components within normal limits  TROPONIN I (HIGH SENSITIVITY) - Abnormal; Notable for the following components:   Troponin I (High Sensitivity) 40 (*)    All other components within normal limits  TROPONIN I (HIGH SENSITIVITY) - Abnormal; Notable for the following components:   Troponin I (High Sensitivity) 38 (*)    All other components within normal limits  RESP PANEL BY RT-PCR (FLU A&B, COVID) ARPGX2  MRSA NEXT GEN BY PCR, NASAL  RESPIRATORY PANEL BY PCR  CULTURE, RESPIRATORY W GRAM STAIN  MAGNESIUM  PHOSPHORUS  PROCALCITONIN  MAGNESIUM  PHOSPHORUS  TSH  STREP PNEUMONIAE URINARY ANTIGEN  LEGIONELLA PNEUMOPHILA SEROGP 1 UR AG  APTT  TROPONIN I (HIGH SENSITIVITY)    EKG EKG Interpretation  Date/Time:  Sunday March 24 2021 13:00:17 EDT Ventricular Rate:  60 PR Interval:  303 QRS Duration: 88 QT Interval:  426 QTC Calculation: 426 R Axis:   44 Text Interpretation: Sinus or ectopic atrial rhythm mild pr prolongation Left atrial enlargement Left ventricular hypertrophy likely digoxin effect on ecg Confirmed by Tanda Rockers ([id number]) on 03/24/2021 2:10:46 PM  Radiology CT Angio Chest PE W and/or Wo Contrast  Addendum Date: 03/24/2021   ADDENDUM REPORT: 03/24/2021 16:18 ADDENDUM: Impression # 4 was accidentally truncated.  It should read: Approximately 3.3 x 2.2 cm discrete anterior  mediastinal mass, potentially a proteinaceous thymic cyst given intermediate density. If clinically warranted, nonemergent, outpatient MRI of the chest with and without contrast could further evaluate. This was discussed with provider Wallace Cullens via telephone at 3:13 p.m. Electronically Signed   By: Feliberto Harts M.D.   On: 03/24/2021 16:18   Result Date: 03/24/2021 CLINICAL DATA:  Pulmonary embolism (PE) suspected, positive D-dimer EXAM: CT ANGIOGRAPHY CHEST WITH CONTRAST TECHNIQUE: Multidetector CT imaging of the chest was performed using the standard protocol during bolus administration of intravenous contrast. Multiplanar CT image reconstructions and MIPs were obtained to evaluate the vascular anatomy. RADIATION DOSE REDUCTION: This exam was performed according to the departmental dose-optimization program which includes automated exposure control, adjustment of the mA and/or kV  according to patient size and/or use of iterative reconstruction technique. CONTRAST:  60mL OMNIPAQUE IOHEXOL 350 MG/ML SOLN COMPARISON:  Chest radiograph the same day. FINDINGS: Cardiovascular: Cardiomegaly. Positive for nonocclusive thrombus within proximal segmental right lower lobe branch. Enlarged main pulmonary artery. Mediastinum/Nodes: Approximately 3.3 x 2.2 cm intermediate density mass in the anterior mediastinum. No definite adenopathy. Lungs/Pleura: Patchy ground-glass and consolidative opacities within the dependent right upper lobe and bilateral lower lobes. Approximately 8 mm pulmonary nodule in the right upper lobe (series 6, image 49). No pneumothorax. Trace pleural fluid. Upper Abdomen: Many small calcifications of the spleen, probably related to prior granulomatous infection. Reflux of contrast into the IVC. Musculoskeletal: Bridging osteophytes at multiple levels in the thoracic spine, suggestive of diffuse idiopathic skeletal hyperostosis. Multilevel degenerative change. No evidence of acute abnormality. Review of  the MIP images confirms the above findings. IMPRESSION: 1. Positive for nonocclusive thrombus within proximal segmental right lower lobe branch. 2. Patchy ground-glass and consolidative opacities within the dependent right upper lobe and bilateral lower lobes, concerning for pneumonia and/or aspiration. 3. Approximately 8 mm pulmonary nodule in the right upper lobe. Non-contrast chest CT at 6-12 months is recommended. If the nodule is stable at time of repeat CT, then future CT at 18-24 months (from today's scan) is considered optional for low-risk patients, but is recommended for high-risk patients. This recommendation follows the consensus statement: Guidelines for Management of Incidental Pulmonary Nodules Detected on CT Images: From the Fleischner Society 2017; Radiology 2017; 284:228-243. 4. Approximately 3.3 x 2.2 cm discrete anterior mediastinal mass, potentially a proteinaceous thymic cyst given intermediate density. If clinically warranted, nonemergent, outpatient MRI of the chest with and without contrast. 5. Cardiomegaly. 6. Enlarged main pulmonary artery, which can be seen with pulmonary arterial hypertension. 7. Aortic Atherosclerosis (ICD10-I70.0). Findings discussed with provider Wallace Cullens via telephone at 3:13 p.m. Electronically Signed: By: Feliberto Harts M.D. On: 03/24/2021 15:17   DG Chest Port 1 View  Result Date: 03/24/2021 CLINICAL DATA:  Cough and low oxygen levels for 4 days. EXAM: PORTABLE CHEST 1 VIEW COMPARISON:  AP chest 09/07/2020 FINDINGS: Left chest wall cardiac pacer is again seen with leads overlying the right atrium and right ventricle. Cardiac silhouette is again moderately enlarged. Mediastinal contours are within normal limits with mild calcification again seen. Mild interstitial thickening appears slightly increased from 09/07/2020 and 07/22/2019. No pleural effusion or pneumothorax. Moderate multilevel degenerative disc changes of the thoracic spine. Left axillary surgical  clips. IMPRESSION: Mild interstitial pulmonary edema, new from prior. Electronically Signed   By: Neita Garnet M.D.   On: 03/24/2021 13:37    Procedures .Critical Care Performed by: Sloan Leiter, DO Authorized by: Sloan Leiter, DO   Critical care provider statement:    Critical care time (minutes):  55   Critical care time was exclusive of:  Separately billable procedures and treating other patients   Critical care was necessary to treat or prevent imminent or life-threatening deterioration of the following conditions:  Respiratory failure and cardiac failure   Critical care was time spent personally by me on the following activities:  Development of treatment plan with patient or surrogate, discussions with consultants, evaluation of patient's response to treatment, examination of patient, ordering and review of laboratory studies, ordering and review of radiographic studies, ordering and performing treatments and interventions, pulse oximetry, re-evaluation of patient's condition, review of old charts and obtaining history from patient or surrogate   Care discussed with: admitting provider  Medications Ordered in ED Medications  heparin ADULT infusion 100 units/mL (25000 units/274mL) (1,000 Units/hr Intravenous Infusion Verify 03/25/21 0500)  azithromycin (ZITHROMAX) 500 mg in sodium chloride 0.9 % 250 mL IVPB (has no administration in time range)  cefTRIAXone (ROCEPHIN) 2 g in sodium chloride 0.9 % 100 mL IVPB (has no administration in time range)  amiodarone (PACERONE) tablet 100 mg (has no administration in time range)  cloNIDine (CATAPRES) tablet 0.2 mg (0.2 mg Oral Given 03/24/21 2154)  diazepam (VALIUM) tablet 2 mg (2 mg Oral Given 03/24/21 2154)  pantoprazole (PROTONIX) EC tablet 40 mg (40 mg Oral Given 03/24/21 2154)  polyethylene glycol (MIRALAX / GLYCOLAX) packet 17 g (has no administration in time range)  acetaminophen (TYLENOL) tablet 650 mg (has no administration in time  range)    Or  acetaminophen (TYLENOL) suppository 650 mg (has no administration in time range)  HYDROcodone-acetaminophen (NORCO/VICODIN) 5-325 MG per tablet 1-2 tablet (has no administration in time range)  albuterol (PROVENTIL) (2.5 MG/3ML) 0.083% nebulizer solution 2.5 mg (has no administration in time range)  guaiFENesin (MUCINEX) 12 hr tablet 600 mg (600 mg Oral Given 03/24/21 2154)  phenol (CHLORASEPTIC) mouth spray 1 spray (has no administration in time range)  iohexol (OMNIPAQUE) 350 MG/ML injection 100 mL (60 mLs Intravenous Contrast Given 03/24/21 1433)  levofloxacin (LEVAQUIN) IVPB 750 mg (750 mg Intravenous New Bag/Given 03/24/21 1557)    ED Course/ Medical Decision Making/ A&P Clinical Course as of 03/25/21 0743  Sun Mar 24, 2021  1412 Creatinine(!): 1.46 Cr similar to her baseline [SG]  1412 Troponin I (High Sensitivity)(!): 44 Troponin increased from baseline, hx CKD. Favor demand ischemia in setting of hypoxia. Will obtain delta trop. ECG stable.  [SG]    Clinical Course User Index [SG] Sloan Leiter, DO                           Medical Decision Making Amount and/or Complexity of Data Reviewed Labs: ordered. Decision-making details documented in ED Course. Radiology: ordered.  Risk Prescription drug management. Decision regarding hospitalization.   Initial Impression and Ddx This patient presents to the Emergency Department for the above complaint. This involves an extensive number of treatment options and is a complaint that carries with it a high risk of complications and morbidity. Vital signs were reviewed.   Serious etiologies considered. Ddx includes but is not limited to: Pneumonia, ACS, PE, viral, metabolic, infectious  Patient PMH that increases complexity of ED encounter: Pulmonary hypertension, age, breast cancer, A-fib  Previous records obtained and reviewed   Additional history obtained from daughter   Interpretation of Diagnostics Labs &  imaging results that were available during my care of the patient were visualized by me and considered in my medical decision making.   Moderate risk Wells score, dimer elevated.  Obtain CT PE  I ordered imaging studies which included chest x-ray, CT PE and I visualized the imaging and I agree with radiologist interpretation.  Nonocclusive thrombus, interstitial edema, pneumonia.  Thymic mass?  Cardiac monitoring reviewed and interpreted personally which shows sinus bradycardia  Social determinants of health include -  N/a    Patient Reassessment and Ultimate Disposition/Management   Clinical Course as of 03/25/21 0743  Sun Mar 24, 2021  1412 Creatinine(!): 1.46 Cr similar to her baseline [SG]  1412 Troponin I (High Sensitivity)(!): 44 Troponin increased from baseline, hx CKD. Favor demand ischemia in setting of hypoxia. Will obtain delta trop. ECG stable.  [  SG]    Clinical Course User Index [SG] Sloan Leiter, DO     Patient with elevated troponin, EKG without evidence of acute ischemia, digoxin effect noted.  Favor demand ischemia in setting of respiratory distress.  Patient continues to require 2 L nasal cannula.  Respiratory status greatly improved on supplemental oxygen, unable to tolerate ambient air.  Patient found to have elevated BNP, interstitial edema noted on x-ray.  Patient also with imaging findings consistent with pulmonary hypertension.  CT is concerning for pneumonia as well in addition to nonocclusive thrombus.  Mediastinal mass. Start Levaquin given patient's allergies.  Heparin gtt.  Recommend admission, patient and family are agreeable.                       Complexity of Problems Addressed Acute illness or injury that poses threat of life of bodily function  Additional Data Reviewed and Analyzed Further history obtained from: Further history from spouse/family member, Past medical history and medications listed in the EMR, Prior ED visit  notes, Recent PCP notes, and Prior labs/imaging results  Patient Encounter Risk Assessment Prescriptions and Consideration of hospitalization      This chart was dictated using voice recognition software.  Despite best efforts to proofread,  errors can occur which can change the documentation meaning.         Final Clinical Impression(s) / ED Diagnoses Final diagnoses:  Hypoxia  Elevated troponin  Acute pulmonary edema (HCC)  Community acquired pneumonia, unspecified laterality    Rx / DC Orders ED Discharge Orders     None         Sloan Leiter, DO 03/25/21 [phone]

## 2021-03-24 NOTE — Assessment & Plan Note (Signed)
Allow permissive hypertension ?Restart clonidine ?

## 2021-03-24 NOTE — Assessment & Plan Note (Signed)
this patient has acute respiratory failure with Hypoxia  as documented by the presence of following: ?O2 saturatio< 90% on RA  ?Likely due to:PE ?Provide O2 therapy and titrate as needed ? Continuous pulse ox ?  check Pulse ox with ambulation prior to discharge ?  may need  TC consult for home O2 set up ?  ?

## 2021-03-24 NOTE — Assessment & Plan Note (Addendum)
Patient reports she has been compliant with Eliquis.  For now switch to heparin may need to be on different anticoagulation as she still has small PE. ? Admit to   Telemetry  ?Initiate heparin drip ? Would likely benefit from case manager consult for long term anticoagulation ?Hold home blood pressure medications avoid hypotension ?Cycle cardiac enzymes ?Order echogram and lower extremity Dopplers  ?  ?   ?

## 2021-03-24 NOTE — H&P (Signed)
? ? ?Allison Norman:[id number] DOB: 1924-12-18 DOA: 03/24/2021 ? ? ?  ?PCP: [name], MD   ?Outpatient Specialists  ?CARDS: Dr. Oval Linsey, Crissie Sickles ?  ? ?Patient arrived to ER on 03/24/21 at 1245 ?Referred by Attending Florencia Reasons, MD ? ? ?Patient coming from:   ? home Lives alone,    ? ?Chief Complaint:   ?Chief Complaint  ?Patient presents with  ? Cough  ? Low oxygen saturations  ?  88% on RA  ? ? ?HPI: ?Allison Norman is a 88 y.o. female with medical history significant of a.fib on eliquis, pacemaker, suprapubic catheter ?  ? ?Presented with  shortness of breath, cough ?Came in with a cough for the past 4 dqays ?Found to be hypoxic down to 88% on RA ?Also endorsing sore throat tested neg for flu and covid ?Denies fever, no CP no leg swelling has been taking her eliquis ? Decreased appetite increased weakness ? No tobacco no etoh ?No leg edema ? ? ? Initial COVID TEST  ?NEGATIVE  ? ?Lab Results  ?Component Value Date  ? [place] NEGATIVE 03/24/2021  ? [place] NEGATIVE 09/05/2020  ? [place] NEGATIVE 08/27/2020  ? [place] NOT DETECTED 12/17/2018  ? ?  ?Regarding pertinent Chronic problems:   ?  ? HTN on Coreg, clonidine ? ? chronic CHF diastolic - last echo 7782 ?Grade III diastolic dysfunction  ? A. Fib -  - CHA2DS2 vas score   5    ?  ? current  on anticoagulation with  Eliquis,  ?   ?       -  Rate control:  Currently controlled with  Coreg   ?       - Rhythm control:   amiodarone,  ?  ?CKD stage IIIa- baseline Cr 1.5 ?Estimated Creatinine Clearance: 20.5 mL/min (A) (by C-G formula based on SCr of 1.46 mg/dL (H)). ? ?Lab Results  ?Component Value Date  ? CREATININE 1.46 (H) 03/24/2021  ? CREATININE 1.55 (H) 01/29/2021  ? CREATININE 1.95 (H) 09/09/2020  ? ?  Chronic anemia - baseline hg Hemoglobin & Hematocrit  ?Recent Labs  ?  09/08/20 ?1034 01/15/21 ?4235 03/24/21 ?1316  ?HGB 8.8* 12.9 11.8*  ?  ? ?While in ER: ?Clinical Course as of 03/24/21 1840  ?Sun Mar 24, 2021  ?1412  Creatinine(!): 1.46 ?Cr similar to her baseline [SG]  ?1412 Troponin I (High Sensitivity)(!): 44 ?Troponin increased from baseline, hx CKD. Favor demand ischemia in setting of hypoxia. Will obtain delta trop. ECG stable.  [SG]  ?  ?Clinical Course User Index ?[SG] Wynona Dove A, DO  ? ?  ?Ordered ?  ? ?CXR - Mild interstitial pulmonary edema, new from prior. ? ?   ?CTA chest -   PE,   evidence of infiltrate  proximal segmental ?right lower lobe branch.  ?Patchy ground-glass and consolidative opacities within the ?dependent right upper lobe and bilateral lower lobes, concerning for ?pneumonia and/or aspiration.  ?. Cardiomegaly. ? . Enlarged main pulmonary artery, which can be seen with pulmonary ?arterial hypertension.  ? ?Following Medications were ordered in ER: ?Medications  ?heparin ADULT infusion 100 units/mL (25000 units/275m) (900 Units/hr Intravenous New Bag/Given 03/24/21 1548)  ?iohexol (OMNIPAQUE) 350 MG/ML injection 100 mL (60 mLs Intravenous Contrast Given 03/24/21 1433)  ?levofloxacin (LEVAQUIN) IVPB 750 mg (750 mg Intravenous New Bag/Given 03/24/21 1557)  ?  ?_______________________________________________________  ?  ?ED Triage Vitals  ?Enc Vitals Group  ?   BP 03/24/21 1257 (!)  180/88  ?   Pulse Rate 03/24/21 1253 60  ?   Resp 03/24/21 1253 20  ?   Temp 03/24/21 1255 99 ?F (37.2 ?C)  ?   Temp Source 03/24/21 1255 Temporal  ?   SpO2 03/24/21 1253 (!) 88 %  ?   Weight 03/24/21 1255 128 lb 1.4 oz (58.1 kg)  ?   Height 03/24/21 1255 5' 5.5" (1.664 m)  ?   Head Circumference --   ?   Peak Flow --   ?   Pain Score 03/24/21 1301 0  ?   Pain Loc --   ?   Pain Edu? --   ?   Excl. in [place]? --   ?DZHG(99)@    ? _________________________________________ ?Significant initial  Findings: ?Abnormal Labs Reviewed  ?BASIC METABOLIC PANEL - Abnormal; Notable for the following components:  ?    Result Value  ? Sodium 134 (*)   ? Chloride 96 (*)   ? Glucose, Bld 155 (*)   ? BUN 28 (*)   ? Creatinine, Ser 1.46 (*)   ?  Calcium 8.8 (*)   ? GFR, Estimated 33 (*)   ? All other components within normal limits  ?CBC WITH DIFFERENTIAL/PLATELET - Abnormal; Notable for the following components:  ? Hemoglobin 11.8 (*)   ? RDW 15.9 (*)   ? All other components within normal limits  ?BRAIN NATRIURETIC PEPTIDE - Abnormal; Notable for the following components:  ? B Natriuretic Peptide 484.3 (*)   ? All other components within normal limits  ?D-DIMER, QUANTITATIVE - Abnormal; Notable for the following components:  ? D-Dimer, Quant 1.18 (*)   ? All other components within normal limits  ?TROPONIN I (HIGH SENSITIVITY) - Abnormal; Notable for the following components:  ? Troponin I (High Sensitivity) 44 (*)   ? All other components within normal limits  ? ?  ?_________________________ ?Troponin 44 ?ECG: Ordered ?Personally reviewed by me showing: ?HR : 60 ?Rhythm:Sinus or ectopic atrial rhythm ?mild pr prolongation ?Left atrial enlargement ?Left ventricular hypertrophy ?QTC 426 ?  ?The recent clinical data is shown below. ?Vitals:  ? 03/24/21 1510 03/24/21 1515 03/24/21 1600 03/24/21 1800  ?BP: (!) 147/64 (!) 146/63 (!) 159/79 (!) 190/68  ?Pulse: (!) 59 (!) 58 (!) 58 60  ?Resp: '15  16 18  '$ ?Temp:    (!) 97.4 ?F (36.3 ?C)  ?TempSrc:    Oral  ?SpO2: 96% 92% 95% 98%  ?Weight:    55.2 kg  ?Height:    5' 5.5" (1.664 m)  ?  ?WBC ? ?   ?Component Value Date/Time  ? WBC 9.1 03/24/2021 1316  ? LYMPHSABS 1.1 03/24/2021 1316  ? LYMPHSABS 1.3 07/05/2019 1048  ? MONOABS 1.0 03/24/2021 1316  ? EOSABS 0.2 03/24/2021 1316  ? EOSABS 0.3 07/05/2019 1048  ? BASOSABS 0.0 03/24/2021 1316  ? BASOSABS 0.0 07/05/2019 1048  ? ?  ? ?Procalcitonin  <0.1 ? ? UA  ordered ?   ? ?Results for orders placed or performed during the hospital encounter of 03/24/21  ?Resp Panel by RT-PCR (Flu A&B, Covid) Nasopharyngeal Swab     Status: None  ? Collection Time: 03/24/21  1:16 PM  ? Specimen: Nasopharyngeal Swab; Nasopharyngeal(NP) swabs in vial transport medium  ?Result Value Ref Range  Status  ? SARS Coronavirus 2 by RT PCR NEGATIVE NEGATIVE Final  ?      ? Influenza A by PCR NEGATIVE NEGATIVE Final  ? Influenza B by PCR NEGATIVE NEGATIVE  Final  ?      ? ?  ?_______________________________________________ ?Hospitalist was called for admission for PE, acute hypoxic failure ? ?The following Work up has been ordered so far: ? ?Orders Placed This Encounter  ?Procedures  ? Critical Care  ? Resp Panel by RT-PCR (Flu A&B, Covid) Nasopharyngeal Swab  ? Expectorated Sputum Assessment w Gram Stain, Rflx to Resp Cult  ? DG Chest Port 1 View  ? CT Angio Chest PE W and/or Wo Contrast  ? Basic metabolic panel  ? CBC with Differential  ? Brain natriuretic peptide  ? D-dimer, quantitative  ? APTT  ? Heparin level (unfractionated)  ? Cardiac monitoring  ? Cardiac monitoring  ? Consult to hospitalist  ? heparin per pharmacy consult  ? Pulse oximetry (single)  ? EKG 12-Lead  ? ECHOCARDIOGRAM COMPLETE  ? Insert peripheral IV  ? Place in observation (patient's expected length of stay will be less than 2 midnights)  ? VAS [place] LOWER EXTREMITY VENOUS (DVT)  ?  ? ?OTHER Significant initial  Findings: ? ?labs showing: ? ?  ?Recent Labs  ?Lab 03/24/21 ?1316  ?NA 134*  ?K 3.8  ?CO2 31  ?GLUCOSE 155*  ?BUN 28*  ?CREATININE 1.46*  ?CALCIUM 8.8*  ? ? ?Cr   stable,   ?Lab Results  ?Component Value Date  ? CREATININE 1.46 (H) 03/24/2021  ? CREATININE 1.55 (H) 01/29/2021  ? CREATININE 1.95 (H) 09/09/2020  ? ? ?Recent Labs  ?Lab 03/24/21 ?2014  ?AST 20  ?ALT 13  ?ALKPHOS 93  ?BILITOT 0.7  ?PROT 6.5  ?ALBUMIN 2.9*  ? ?Lab Results  ?Component Value Date  ? CALCIUM 8.8 (L) 03/24/2021  ? ?    ?   ?Plt: ?Lab Results  ?Component Value Date  ? PLT 224 03/24/2021  ? ?  ?  ?COVID-19 Labs ? ?Recent Labs  ?  03/24/21 ?1316  ?DDIMER 1.18*  ? ? ?Lab Results  ?Component Value Date  ? [place] NEGATIVE 03/24/2021  ? [place] NEGATIVE 09/05/2020  ? [place] NEGATIVE 08/27/2020  ? [place] NOT DETECTED 12/17/2018  ? ?  ?   ?Recent Labs   ?Lab 03/24/21 ?1316  ?WBC 9.1  ?NEUTROABS 6.8  ?HGB 11.8*  ?HCT 37.1  ?MCV 93.0  ?PLT 224  ? ? ?HG/HCT   stable,    ?   ?Component Value Date/Time  ? HGB 11.8 (L) 03/24/2021 1316  ? HGB 11.7 07/05/2019 1048  ? HCT 37

## 2021-03-24 NOTE — Subjective & Objective (Signed)
Came in with a cough for the past 4 dqays ?Found to be hypoxic down to 88% on RA ?Also endorsing sore throat tested neg for flu and covid ? ?

## 2021-03-24 NOTE — Assessment & Plan Note (Signed)
Chronic chronic stable in the setting of tachy bradyc syndrome followed by cardiology ?

## 2021-03-24 NOTE — Assessment & Plan Note (Signed)
Chronic repeat echogram patient may need follow-up with pulmonology or cardiology regarding this ?

## 2021-03-24 NOTE — Assessment & Plan Note (Signed)
Discussed with patient she is aware may have thymic cyst.  At this point patient is unsure if she wants to investigate whether this given her advanced age and multiple medical problems but she will discuss this with primary care provider ?

## 2021-03-24 NOTE — Assessment & Plan Note (Signed)
-  chronic avoid nephrotoxic medications such as NSAIDs, Vanco Zosyn combo,  avoid hypotension, continue to follow renal function  

## 2021-03-24 NOTE — Assessment & Plan Note (Signed)
- -  Patient presenting with  productive cough hypoxia  and infiltrate on imagining ?  ? will admit for treatment of CAP will start on appropriate antibiotic coverage. - Rocephin/azithromycin ?History of penicillin allergies but has tolerated cephalosporins numerous occasions ?  Obtain:  sputum cultures, ?                Obtain respiratory panel  ?                  influenza serologies negative ?                 COVID PCR negative  ?                blood cultures and sputum cultures ordered  ?                 strep pneumo UA antigen,  ?               check for Legionella antigen. ?               Provide oxygen as needed.  ? ? ?

## 2021-03-24 NOTE — Assessment & Plan Note (Signed)
Hold Lasix for tonight gently rehydrate given decreased p.o. intake and PE continue to monitor ?

## 2021-03-24 NOTE — ED Notes (Signed)
Patient transported to CT 

## 2021-03-24 NOTE — Assessment & Plan Note (Signed)
Hold Eliquis while on heparin.  Restart Coreg when blood pressure allows ?Restart amiodarone ?Check digoxin level and restart if able ?

## 2021-03-25 ENCOUNTER — Observation Stay (HOSPITAL_COMMUNITY): Payer: Medicare Other

## 2021-03-25 ENCOUNTER — Inpatient Hospital Stay (HOSPITAL_COMMUNITY): Payer: Medicare Other

## 2021-03-25 ENCOUNTER — Encounter (HOSPITAL_COMMUNITY): Payer: Self-pay | Admitting: Internal Medicine

## 2021-03-25 DIAGNOSIS — R0602 Shortness of breath: Secondary | ICD-10-CM | POA: Diagnosis not present

## 2021-03-25 DIAGNOSIS — J189 Pneumonia, unspecified organism: Secondary | ICD-10-CM | POA: Diagnosis present

## 2021-03-25 DIAGNOSIS — I2699 Other pulmonary embolism without acute cor pulmonale: Secondary | ICD-10-CM

## 2021-03-25 DIAGNOSIS — Z20822 Contact with and (suspected) exposure to covid-19: Secondary | ICD-10-CM | POA: Diagnosis present

## 2021-03-25 DIAGNOSIS — I130 Hypertensive heart and chronic kidney disease with heart failure and stage 1 through stage 4 chronic kidney disease, or unspecified chronic kidney disease: Secondary | ICD-10-CM | POA: Diagnosis present

## 2021-03-25 DIAGNOSIS — I495 Sick sinus syndrome: Secondary | ICD-10-CM | POA: Diagnosis present

## 2021-03-25 DIAGNOSIS — I480 Paroxysmal atrial fibrillation: Secondary | ICD-10-CM | POA: Diagnosis not present

## 2021-03-25 DIAGNOSIS — H3530 Unspecified macular degeneration: Secondary | ICD-10-CM | POA: Diagnosis present

## 2021-03-25 DIAGNOSIS — Z79899 Other long term (current) drug therapy: Secondary | ICD-10-CM | POA: Diagnosis not present

## 2021-03-25 DIAGNOSIS — E43 Unspecified severe protein-calorie malnutrition: Secondary | ICD-10-CM | POA: Diagnosis present

## 2021-03-25 DIAGNOSIS — D509 Iron deficiency anemia, unspecified: Secondary | ICD-10-CM | POA: Diagnosis present

## 2021-03-25 DIAGNOSIS — Z85828 Personal history of other malignant neoplasm of skin: Secondary | ICD-10-CM | POA: Diagnosis not present

## 2021-03-25 DIAGNOSIS — I4821 Permanent atrial fibrillation: Secondary | ICD-10-CM | POA: Diagnosis present

## 2021-03-25 DIAGNOSIS — M353 Polymyalgia rheumatica: Secondary | ICD-10-CM | POA: Diagnosis present

## 2021-03-25 DIAGNOSIS — M316 Other giant cell arteritis: Secondary | ICD-10-CM | POA: Diagnosis present

## 2021-03-25 DIAGNOSIS — R911 Solitary pulmonary nodule: Secondary | ICD-10-CM | POA: Diagnosis present

## 2021-03-25 DIAGNOSIS — Z885 Allergy status to narcotic agent status: Secondary | ICD-10-CM | POA: Diagnosis not present

## 2021-03-25 DIAGNOSIS — N1832 Chronic kidney disease, stage 3b: Secondary | ICD-10-CM | POA: Diagnosis present

## 2021-03-25 DIAGNOSIS — D7389 Other diseases of spleen: Secondary | ICD-10-CM | POA: Diagnosis not present

## 2021-03-25 DIAGNOSIS — R778 Other specified abnormalities of plasma proteins: Secondary | ICD-10-CM | POA: Diagnosis not present

## 2021-03-25 DIAGNOSIS — I10 Essential (primary) hypertension: Secondary | ICD-10-CM | POA: Diagnosis not present

## 2021-03-25 DIAGNOSIS — Z880 Allergy status to penicillin: Secondary | ICD-10-CM | POA: Diagnosis not present

## 2021-03-25 DIAGNOSIS — N3289 Other specified disorders of bladder: Secondary | ICD-10-CM | POA: Diagnosis not present

## 2021-03-25 DIAGNOSIS — Z681 Body mass index (BMI) 19 or less, adult: Secondary | ICD-10-CM | POA: Diagnosis not present

## 2021-03-25 DIAGNOSIS — J810 Acute pulmonary edema: Secondary | ICD-10-CM | POA: Diagnosis present

## 2021-03-25 DIAGNOSIS — Z950 Presence of cardiac pacemaker: Secondary | ICD-10-CM | POA: Diagnosis not present

## 2021-03-25 DIAGNOSIS — J9601 Acute respiratory failure with hypoxia: Secondary | ICD-10-CM | POA: Diagnosis present

## 2021-03-25 DIAGNOSIS — I272 Pulmonary hypertension, unspecified: Secondary | ICD-10-CM | POA: Diagnosis present

## 2021-03-25 DIAGNOSIS — M810 Age-related osteoporosis without current pathological fracture: Secondary | ICD-10-CM | POA: Diagnosis present

## 2021-03-25 DIAGNOSIS — Z7901 Long term (current) use of anticoagulants: Secondary | ICD-10-CM | POA: Diagnosis not present

## 2021-03-25 DIAGNOSIS — I5032 Chronic diastolic (congestive) heart failure: Secondary | ICD-10-CM | POA: Diagnosis present

## 2021-03-25 DIAGNOSIS — R195 Other fecal abnormalities: Secondary | ICD-10-CM | POA: Diagnosis not present

## 2021-03-25 LAB — RESPIRATORY PANEL BY PCR

## 2021-03-25 LAB — EXPECTORATED SPUTUM ASSESSMENT W GRAM STAIN, RFLX TO RESP C

## 2021-03-25 LAB — ECHOCARDIOGRAM COMPLETE
AR max vel: 1.37 cm2
AV Area VTI: 1.27 cm2
AV Area mean vel: 1.33 cm2
AV Mean grad: 13.3 mmHg
AV Peak grad: 22.8 mmHg
Ao pk vel: 2.39 m/s
Area-P 1/2: 2.75 cm2
Calc EF: 66.6 %
Height: 65.5 in
MV M vel: 5.49 m/s
MV Peak grad: 120.6 mmHg
S' Lateral: 2.6 cm
Single Plane A2C EF: 67.2 %
Single Plane A4C EF: 66.1 %
Weight: 1947.1 oz

## 2021-03-25 LAB — CBC
HCT: 35.8 % — ABNORMAL LOW (ref 36.0–46.0)
Hemoglobin: 11.4 g/dL — ABNORMAL LOW (ref 12.0–15.0)
MCH: 30.1 pg (ref 26.0–34.0)
MCHC: 31.8 g/dL (ref 30.0–36.0)
MCV: 94.5 fL (ref 80.0–100.0)
Platelets: 233 10*3/uL (ref 150–400)
RBC: 3.79 MIL/uL — ABNORMAL LOW (ref 3.87–5.11)
RDW: 15.9 % — ABNORMAL HIGH (ref 11.5–15.5)
WBC: 9.9 10*3/uL (ref 4.0–10.5)
nRBC: 0 % (ref 0.0–0.2)

## 2021-03-25 LAB — URINALYSIS, ROUTINE W REFLEX MICROSCOPIC
Bilirubin Urine: NEGATIVE
Glucose, UA: NEGATIVE mg/dL
Ketones, ur: NEGATIVE mg/dL
Leukocytes,Ua: NEGATIVE
Nitrite: NEGATIVE
Specific Gravity, Urine: 1.015 (ref 1.005–1.030)
pH: 6 (ref 5.0–8.0)

## 2021-03-25 LAB — APTT
aPTT: 59 seconds — ABNORMAL HIGH (ref 24–36)
aPTT: 64 seconds — ABNORMAL HIGH (ref 24–36)
aPTT: 108 seconds — ABNORMAL HIGH (ref 24–36)

## 2021-03-25 LAB — URINALYSIS, MICROSCOPIC (REFLEX)

## 2021-03-25 LAB — COMPREHENSIVE METABOLIC PANEL
ALT: 12 U/L (ref 0–44)
AST: 18 U/L (ref 15–41)
Albumin: 2.9 g/dL — ABNORMAL LOW (ref 3.5–5.0)
Alkaline Phosphatase: 92 U/L (ref 38–126)
Anion gap: 8 (ref 5–15)
BUN: 30 mg/dL — ABNORMAL HIGH (ref 8–23)
CO2: 28 mmol/L (ref 22–32)
Calcium: 7.9 mg/dL — ABNORMAL LOW (ref 8.9–10.3)
Chloride: 98 mmol/L (ref 98–111)
Creatinine, Ser: 1.66 mg/dL — ABNORMAL HIGH (ref 0.44–1.00)
GFR, Estimated: 28 mL/min — ABNORMAL LOW (ref >60)
Glucose, Bld: 111 mg/dL — ABNORMAL HIGH (ref 70–99)
Potassium: 3.8 mmol/L (ref 3.5–5.1)
Sodium: 134 mmol/L — ABNORMAL LOW (ref 135–145)
Total Bilirubin: 0.8 mg/dL (ref 0.3–1.2)
Total Protein: 6.6 g/dL (ref 6.5–8.1)

## 2021-03-25 LAB — STREP PNEUMONIAE URINARY ANTIGEN: Strep Pneumo Urinary Antigen: NEGATIVE

## 2021-03-25 LAB — MAGNESIUM: Magnesium: 2.4 mg/dL (ref 1.7–2.4)

## 2021-03-25 LAB — HEPARIN LEVEL (UNFRACTIONATED)
Heparin Unfractionated: >1.1 IU/mL — ABNORMAL HIGH (ref 0.30–0.70)
Heparin Unfractionated: >1.1 IU/mL — ABNORMAL HIGH (ref 0.30–0.70)

## 2021-03-25 LAB — PREALBUMIN: Prealbumin: 10.3 mg/dL — ABNORMAL LOW (ref 18–38)

## 2021-03-25 LAB — TSH: TSH: 2.296 u[IU]/mL (ref 0.350–4.500)

## 2021-03-25 LAB — PHOSPHORUS: Phosphorus: 3.7 mg/dL (ref 2.5–4.6)

## 2021-03-25 MED ORDER — DIGOXIN 125 MCG PO TABS
0.1250 mg | ORAL_TABLET | ORAL | Status: DC
  Administered 2021-03-25 – 2021-03-27 (×2): 0.125 mg via ORAL
  Filled 2021-03-25 (×2): qty 1

## 2021-03-25 MED ORDER — GUAIFENESIN 100 MG/5ML PO LIQD
10.0000 mL | ORAL | Status: DC | PRN
  Administered 2021-03-25: 10 mL via ORAL
  Administered 2021-03-25: 5 mL via ORAL
  Administered 2021-03-25 – 2021-03-26 (×3): 10 mL via ORAL
  Filled 2021-03-25 (×5): qty 10

## 2021-03-25 MED ORDER — HEPARIN (PORCINE) 25000 UT/250ML-% IV SOLN
1100.0000 [IU]/h | INTRAVENOUS | Status: DC
  Filled 2021-03-25: qty 250

## 2021-03-25 MED ORDER — PHENOL 1.4 % MT LIQD
1.0000 | OROMUCOSAL | Status: DC | PRN
  Filled 2021-03-25: qty 177

## 2021-03-25 MED ORDER — BENZONATATE 100 MG PO CAPS: 100.0000 mg | ORAL_CAPSULE | Freq: Three times a day (TID) | ORAL | Status: DC | PRN

## 2021-03-25 MED ORDER — HEPARIN (PORCINE) 25000 UT/250ML-% IV SOLN
1050.0000 [IU]/h | INTRAVENOUS | Status: AC
  Filled 2021-03-25: qty 250

## 2021-03-25 MED ORDER — IOHEXOL 9 MG/ML PO SOLN
500.0000 mL | ORAL | Status: AC
  Administered 2021-03-25 (×2): 500 mL via ORAL

## 2021-03-25 NOTE — Progress Notes (Signed)
Pt found to have IV site bleeding. Catheter was partially removed from site and unable to be flushed at this time. Catheter was removed and pressure was held. Pressure bandaged applied. Hematoma noted on forearm from multiple lab draws. Scant bleeding noted from wrist IV site. Pharmacy and provider notified.  ?

## 2021-03-25 NOTE — Progress Notes (Signed)
BLE venous duplex has been completed. ? ? ?Results can be found under chart review under CV PROC. ?03/25/2021 10:09 AM ?Eulan Heyward RVT, RDMS ? ?

## 2021-03-25 NOTE — Progress Notes (Signed)
ANTICOAGULATION CONSULT NOTE  ? ?Pharmacy Consult for heparin ?Indication: pulmonary embolus ? ?Allergies  ?Allergen Reactions  ? Codeine Itching  ? Penicillins Itching, Rash and Other (See Comments)  ?  Has patient had a PCN reaction causing immediate rash, facial/tongue/throat swelling, SOB or lightheadedness with hypotension: Yes ?Has patient had a PCN reaction causing severe rash involving mucus membranes or skin necrosis: No ?Has patient had a PCN reaction that required hospitalization: No ?Has patient had a PCN reaction occurring within the last 10 years: No ?If all of the above answers are "NO", then may proceed with Cephalosporin use.  ? Doxazosin   ?  Heavy feeling in chest, congestion, wheezing  ? Doxycycline Other (See Comments)  ?  Cause chest tightness  ? Hydralazine   ?  Felt bad and could hear pulse in head   ? ? ?Patient Measurements: ?Height: 5' 5.5" (166.4 cm) ?Weight: 55.2 kg (121 lb 11.1 oz) ?IBW/kg (Calculated) : 58.15 ?Heparin Dosing Weight: TBW ? ?Vital Signs: ?Temp: 98.2 ?F (36.8 ?C) (03/20 2127) ?Temp Source: Oral (03/20 2127) ?BP: 183/77 (03/20 2127) ?Pulse Rate: 60 (03/20 2127) ? ?Labs: ?Recent Labs  ?  03/24/21 ?1316 03/24/21 ?1518 03/24/21 ?2014 03/24/21 ?2203 03/25/21 ?0124 03/25/21 ?1112 03/25/21 ?2025  ?HGB 11.8*  --   --   --  11.4*  --   --   ?HCT 37.1  --   --   --  35.8*  --   --   ?PLT 224  --   --   --  233  --   --   ?APTT  --   --   --   --  59* 64* 108*  ?HEPARINUNFRC  --   --   --   --  >1.10*  --  >1.10*  ?CREATININE 1.46*  --  1.46*  --  1.66*  --   --   ?TROPONINIHS 44* <2 40* 38*  --   --   --   ? ? ? ?Estimated Creatinine Clearance: 18.1 mL/min (A) (by C-G formula based on SCr of 1.66 mg/dL (H)). ? ? ?Assessment: ?42 yoF presenting with cough, SOB. PMH significant for atrial fibrillation, currently taking apixaban 2.5 mg PO BID PTA (dose appropriately reduced for age >69, TBW < 60 kg while prescribed for afib).  ?CTA reveals nonocclusive thrombus within proximal  segmental right lower lobe branch. Pharmacy consulted to dose heparin drip on admission for PE.  ? ?Last dose of apixaban 3/19 @ 10am ?No baseline HL, aPTT obtained ? ?03/25/21 ?aPTT = 108 seconds is slightly supra-therapeutic after heparin infusion increased to 1100 units/hr ?Heparin level > 1.1 due to apixaban still on board ?Informed by RN that heparin infusing in PIV in right hand, pt with left arm restriction. Heparin paused/line flushed for lab draw ?CBC:  Hgb slightly low but stable; Plt WNL & stable ?Confirmed with RN that heparin infusing at correct rate. No interruptions other than brief pause for lab draw. Noted to have some IV site bleeding and hematoma (MD aware). ? ?Monitor heparin using aPTT since HL falsely elevated due to DOAC ? ?Goal of Therapy:  ?Heparin level 0.3-0.7 units/ml ?aPTT 66-102 seconds ?Monitor platelets by anticoagulation protocol: Yes ?  ?Plan:  ?reduce heparin infusion to 1050 units/hr ?Check 8 hour aPTT/HL. Once they correlate, can monitor using HL only. ?CBC, HL/aPTT daily while on heparin infusion ?Monitor for signs of bleeding ? ?Allison Norman, Pharm.D ?03/25/2021 9:30 PM ? ?

## 2021-03-25 NOTE — Progress Notes (Signed)
ANTICOAGULATION CONSULT NOTE  ? ?Pharmacy Consult for heparin ?Indication: pulmonary embolus ? ?Allergies  ?Allergen Reactions  ? Codeine Itching  ? Penicillins Itching, Rash and Other (See Comments)  ?  Has patient had a PCN reaction causing immediate rash, facial/tongue/throat swelling, SOB or lightheadedness with hypotension: Yes ?Has patient had a PCN reaction causing severe rash involving mucus membranes or skin necrosis: No ?Has patient had a PCN reaction that required hospitalization: No ?Has patient had a PCN reaction occurring within the last 10 years: No ?If all of the above answers are "NO", then may proceed with Cephalosporin use.  ? Doxazosin   ?  Heavy feeling in chest, congestion, wheezing  ? Doxycycline Other (See Comments)  ?  Cause chest tightness  ? Hydralazine   ?  Felt bad and could hear pulse in head   ? ? ?Patient Measurements: ?Height: 5' 5.5" (166.4 cm) ?Weight: 55.2 kg (121 lb 11.1 oz) ?IBW/kg (Calculated) : 58.15 ?Heparin Dosing Weight: TBW ? ?Vital Signs: ?Temp: 99.1 ?F (37.3 ?C) (03/20 1030) ?Temp Source: Oral (03/20 1030) ?BP: 166/69 (03/20 1030) ?Pulse Rate: 60 (03/20 1030) ? ?Labs: ?Recent Labs  ?  03/24/21 ?1316 03/24/21 ?1518 03/24/21 ?2014 03/24/21 ?2203 03/25/21 ?0124 03/25/21 ?1112  ?HGB 11.8*  --   --   --  11.4*  --   ?HCT 37.1  --   --   --  35.8*  --   ?PLT 224  --   --   --  233  --   ?APTT  --   --   --   --  59* 64*  ?HEPARINUNFRC  --   --   --   --  >1.10*  --   ?CREATININE 1.46*  --  1.46*  --  1.66*  --   ?TROPONINIHS 44* <2 40* 38*  --   --   ? ? ? ?Estimated Creatinine Clearance: 18.1 mL/min (A) (by C-G formula based on SCr of 1.66 mg/dL (H)). ? ? ?Medical History: ?Past Medical History:  ?Diagnosis Date  ? Atrial fibrillation ([place]) 2017  ? a. s/p DCCV in 10/2015  b. recurrent in 08/2016 --> rate-control pursued.   ? Cancer [facility])   ? Breast  ? CKD (chronic kidney disease)   ? GCA (giant cell arteritis) ([place])   ? Hordeolum internum left lower eyelid 06/28/2019  ?  Hypertension   ? Macular degeneration   ? Osteoporosis   ? PMR (polymyalgia rheumatica) ([facility])   ? Pulmonary hypertension, unspecified ([place]) 02/14/2021  ? Resistant hypertension 03/15/2019  ? Shortness of breath 03/15/2019  ? Skin cancer   ? ? ? ?Assessment: ?35 yoF presenting with cough, SOB. PMH significant for atrial fibrillation, currently taking apixaban 2.5 mg PO BID PTA (dose appropriately reduced for age >74, TBW < 60 kg while prescribed for afib).  ?CTA reveals nonocclusive thrombus within proximal segmental right lower lobe branch. Pharmacy consulted to dose heparin drip on admission for PE.  ? ?Last dose of apixaban 3/19 @ 10am ?No baseline HL, aPTT obtained ? ?03/25/21 ?aPTT = 64 seconds is slightly subtherapeutic on heparin infusion of 1000 units/hr ?Informed by RN that heparin infusing in PIV in right hand, pt with left arm restriction. Heparin paused/line flushed for lab draw ?CBC:  Hgb slightly low but stable; Plt WNL & stable ?Confirmed with RN that heparin infusing at correct rate. No interruptions other than brief pause for lab draw. Noted to have some IV site bleeding and hematoma (MD aware). ? ?Monitor  heparin using aPTT since HL falsely elevated due to DOAC ? ?Goal of Therapy:  ?Heparin level 0.3-0.7 units/ml ?aPTT 66-102 seconds ?Monitor platelets by anticoagulation protocol: Yes ?  ?Plan:  ?Increase heparin infusion to 1100 units/hr ?Check 8 hour aPTT/HL. Once they correlate, can monitor using HL only. ?CBC, HL/aPTT daily while on heparin infusion ?Monitor for signs of bleeding ? ?Lenis Noon, PharmD ?03/25/21 ?12:23 PM ?

## 2021-03-25 NOTE — Progress Notes (Signed)
ANTICOAGULATION CONSULT NOTE  ? ?Pharmacy Consult for heparin ?Indication: pulmonary embolus ? ?Allergies  ?Allergen Reactions  ? Codeine Itching  ? Penicillins Itching, Rash and Other (See Comments)  ?  Has patient had a PCN reaction causing immediate rash, facial/tongue/throat swelling, SOB or lightheadedness with hypotension: Yes ?Has patient had a PCN reaction causing severe rash involving mucus membranes or skin necrosis: No ?Has patient had a PCN reaction that required hospitalization: No ?Has patient had a PCN reaction occurring within the last 10 years: No ?If all of the above answers are "NO", then may proceed with Cephalosporin use.  ? Doxazosin   ?  Heavy feeling in chest, congestion, wheezing  ? Doxycycline Other (See Comments)  ?  Cause chest tightness  ? Hydralazine   ?  Felt bad and could hear pulse in head   ? ? ?Patient Measurements: ?Height: 5' 5.5" (166.4 cm) ?Weight: 55.2 kg (121 lb 11.1 oz) ?IBW/kg (Calculated) : 58.15 ?Heparin Dosing Weight: TBW ? ?Vital Signs: ?Temp: 98.7 ?F (37.1 ?C) (03/20 0100) ?Temp Source: Oral (03/20 0100) ?BP: 172/70 (03/20 0100) ?Pulse Rate: 60 (03/20 0100) ? ?Labs: ?Recent Labs  ?  03/24/21 ?1316 03/24/21 ?1518 03/24/21 ?2014 03/24/21 ?2203 03/25/21 ?0124  ?HGB 11.8*  --   --   --  11.4*  ?HCT 37.1  --   --   --  35.8*  ?PLT 224  --   --   --  233  ?APTT  --   --   --   --  59*  ?HEPARINUNFRC  --   --   --   --  >1.10*  ?CREATININE 1.46*  --  1.46*  --  1.66*  ?TROPONINIHS 44* <2 40* 38*  --   ? ? ? ?Estimated Creatinine Clearance: 18.1 mL/min (A) (by C-G formula based on SCr of 1.66 mg/dL (H)). ? ? ?Medical History: ?Past Medical History:  ?Diagnosis Date  ? Atrial fibrillation ([place]) 2017  ? a. s/p DCCV in 10/2015  b. recurrent in 08/2016 --> rate-control pursued.   ? Cancer [facility])   ? Breast  ? CKD (chronic kidney disease)   ? GCA (giant cell arteritis) ([place])   ? Hordeolum internum left lower eyelid 06/28/2019  ? Hypertension   ? Macular degeneration   ? Osteoporosis    ? PMR (polymyalgia rheumatica) ([facility])   ? Pulmonary hypertension, unspecified ([place]) 02/14/2021  ? Resistant hypertension 03/15/2019  ? Shortness of breath 03/15/2019  ? Skin cancer   ? ? ? ?Assessment: ?67 YOF presenting with cough, SOB, CT angio chest showing nonocclusive thrombus, she is on Eliquis PTA for afib taken this AM.   ? ?03/25/21 ?Heparin level > 1.1 (falsely elevated due to lingering effects of Eliquis) ?APTT = 59 sec (subtherapeutic) with IV heparin @ 900 units/hr ?Hgb = 11.4  Pltc wnl ?No compliations of therapy noted ? ?Goal of Therapy:  ?Heparin level 0.3-0.7 units/ml ?aPTT 66-102 seconds ?Monitor platelets by anticoagulation protocol: Yes ?  ?Plan:  ?Increase heparin gtt to 1000 units/hr ?F/u 8 hour aPTT ?Daily heparin level & CBC ? ?Leone Haven, PharmD ?03/25/2021 3:14 AM ? ? ? ?

## 2021-03-25 NOTE — TOC Initial Note (Addendum)
Transition of Care (TOC) - Initial/Assessment Note  ? ? ?Patient Details  ?Name: Allison Norman ?MRN: [id number] ?Date of Birth: 01-Jul-1924 ? ?Transition of Care [facility]) CM/SW Contact:    ?Dessa Phi, RN ?Phone Number: ?03/25/2021, 3:27 PM ? ?Clinical Narrative: From home, has private duty caregivers. On 02-monitor if needed @ home. Spoke to dtr Nancy-no preference if home 02 needed;has own transport home.              ? ? ?Expected Discharge Plan: Home/Self Care ?Barriers to Discharge: Continued Medical Work up ? ? ?Patient Goals and CMS Choice ?Patient states their goals for this hospitalization and ongoing recovery are:: home ?CMS Medicare.gov Compare Post Acute Care list provided to:: Patient Represenative (must comment) (Nancy(dtr) [phone]) ?Choice offered to / list presented to : Adult Children ? ?Expected Discharge Plan and Services ?Expected Discharge Plan: Home/Self Care ?  ?Discharge Planning Services: CM Consult ?Post Acute Care Choice: NA ?Living arrangements for the past 2 months: [place] ?                ?  ?  ?  ?  ?  ?  ?  ?  ?  ?  ? ?Prior Living Arrangements/Services ?Living arrangements for the past 2 months: [place] ?Lives with:: Self ?Patient language and need for interpreter reviewed:: Yes ?Do you feel safe going back to the place where you live?: Yes      ?Need for Family Participation in Patient Care: Yes (Comment) ?Care giver support system in place?: Yes (comment) ?Current home services: Other (comment) (private duty care givers.) ?Criminal Activity/Legal Involvement Pertinent to Current Situation/Hospitalization: No - Comment as needed ? ?Activities of Daily Living ?Home Assistive Devices/Equipment: Chana Bode (specify type) ?ADL Screening (condition at time of admission) ?Patient's cognitive ability adequate to safely complete daily activities?: Yes ?Is the patient deaf or have difficulty hearing?: Yes ?Does the patient have difficulty seeing, even when  wearing glasses/contacts?: No ?Does the patient have difficulty concentrating, remembering, or making decisions?: No ?Patient able to express need for assistance with ADLs?: Yes ?Does the patient have difficulty dressing or bathing?: Yes ?Independently performs ADLs?: No ?Communication: Independent ?Dressing (OT): Needs assistance ?Is this a change from baseline?: Pre-admission baseline ?Grooming: Needs assistance ?Is this a change from baseline?: Pre-admission baseline ?Feeding: Independent ?Bathing: Needs assistance ?Is this a change from baseline?: Pre-admission baseline ?Toileting: Needs assistance ?Is this a change from baseline?: Pre-admission baseline ?In/Out Bed: Needs assistance ?Is this a change from baseline?: Pre-admission baseline ?Walks in Home: Needs assistance ?Is this a change from baseline?: Pre-admission baseline ?Does the patient have difficulty walking or climbing stairs?: Yes ?Weakness of Legs: Both ?Weakness of Arms/Hands: Both ? ?Permission Sought/Granted ?Permission sought to share information with : Case Manager ?Permission granted to share information with : Yes, Verbal Permission Granted ? Share Information with NAME: Case manager ?   ?   ?   ? ?Emotional Assessment ?Appearance:: Appears stated age ?Attitude/Demeanor/Rapport: Gracious ?Affect (typically observed): Accepting ?Orientation: : Oriented to Self, Oriented to Place, Oriented to  Time, Oriented to Situation ?  ?Psych Involvement: No (comment) ? ?Admission diagnosis:  Acute pulmonary edema ([place]) [J81.0] ?Hypoxia [R09.02] ?Elevated troponin [R77.8] ?Acute hypoxemic respiratory failure ([place]) [J96.01] ?Community acquired pneumonia, unspecified laterality [J18.9] ?Patient Active Problem List  ? Diagnosis Date Noted  ? Acute hypoxemic respiratory failure ([place]) 03/24/2021  ? Acute respiratory failure with hypoxia ([place]) 03/24/2021  ? Acute pulmonary embolism ([place]) 03/24/2021  ?  CAP (community acquired pneumonia) 03/24/2021  ? Pulmonary  nodule 03/24/2021  ? Chronic diastolic CHF (congestive heart failure) ([place]) 03/24/2021  ? Pulmonary hypertension, unspecified ([place]) 02/14/2021  ? Vitreomacular traction syndrome of right eye 01/28/2021  ? Acute urinary retention 09/07/2020  ? Iron deficiency anemia 09/07/2020  ? Atrial fibrillation ([place]) 09/05/2020  ? Laceration of right lower extremity 08/28/2020  ? Stage 3b chronic kidney disease (CKD) ([place])   ? Acute blood loss anemia 08/27/2020  ? Advanced nonexudative age-related macular degeneration of right eye without subfoveal involvement 03/15/2020  ? Pacemaker 11/01/2019  ? Tachycardia-bradycardia syndrome ([place]) 08/05/2019  ? Bradycardia 07/05/2019  ? Meibomian blepharitis, left 06/28/2019  ? Exudative age-related macular degeneration of right eye with active choroidal neovascularization ([place]) 05/03/2019  ? Cystoid macular edema of right eye 05/03/2019  ? Advanced nonexudative age-related macular degeneration of left eye with subfoveal involvement 05/03/2019  ? Resistant hypertension 03/15/2019  ? Shortness of breath 03/15/2019  ? Edema leg 01/19/2019  ? Fusion beats 08/03/2017  ? Atypical atrial flutter ([place])   ? Persistent atrial fibrillation ([place]) 08/19/2016  ? On anticoagulant therapy 07/10/2016  ? Renal insufficiency 12/07/2015  ? PAF (paroxysmal atrial fibrillation) ([place])   ? Atrial fibrillation with RVR ([place]) 08/31/2015  ? CRI (chronic renal insufficiency), stage 4 (severe) ([place]) 08/31/2015  ? Essential hypertension 07/16/2009  ? TEMPORAL ARTERITIS 07/16/2009  ? Polymyalgia rheumatica ([place]) 07/16/2009  ? MRSA 07/02/2009  ? PYOGENIC ARTHRITIS, LOWER LEG 07/02/2009  ? ?PCP:  [name], MD ?Pharmacy:   ?[place], [place] ?[place] [address] ?Phone: [phone] Fax: [phone] ? ? ? ? ?Social Determinants of Health (SDOH) Interventions ?  ? ?Readmission Risk Interventions ?No flowsheet data found. ? ? ?

## 2021-03-25 NOTE — Care Management Obs Status (Signed)
MEDICARE OBSERVATION STATUS NOTIFICATION ? ? ?Patient Details  ?Name: Allison Norman ?MRN: [id number] ?Date of Birth: 11-13-1924 ? ? ?Medicare Observation Status Notification Given:  Yes ? ? ? ?Dessa Phi, RN ?03/25/2021, 3:31 PM ?

## 2021-03-25 NOTE — Progress Notes (Signed)
The patient has completed 1.5 bottles of contrast for Abdominal CT scan. The patient states she is unable to complete the second bottle. Radiology Tech updated.  ?

## 2021-03-25 NOTE — Progress Notes (Addendum)
[facility] Hospital[facility] ? ?PROGRESS NOTE ? ?Allison Norman NGE:[id number] DOB: [birth year] DOA: 03/24/2021 ?PCP: [name], MD ? ? ?Brief HPI:   ?88 year old female with history of atrial fibrillation on Eliquis, s/p pacemaker, suprapubic catheter presented with shortness of breath and cough.  She was found to be hypoxemic with O2 sats 88% on room air.  In the ED CTA chest showed pulmonary embolism.  Patient started on heparin protocol. ? ? ? ?Subjective  ? ?Patient seen and examined, complains of cough. ? ? Assessment/Plan:  ? ? ? ?Acute hypoxemic respiratory failure ?-Sec continue PE ?-Patient was on Eliquis at a lower dose of 2.5 mg p.o. twice daily for atrial fibrillation ?-Discussed with hematology, Dr. Chryl Heck, patient dose of Eliquis will be changed to 5 mg twice a day for treatment for pulmonary embolism ? ?Pulmonary embolism ?-Seen on CTA chest ?-Venous duplex of lower extremities negative bilaterally ?-Started on IV heparin as above ?-We will start Eliquis 5 mg twice a day before discharge ?-As patient was on Eliquis 2.5 mg twice a day for A-fib, she was never getting recommended dose for Eliquis ?-Patient to follow-up with hematology/oncology, Dr. Chryl Heck as outpatient ?-Echocardiogram done, result is currently pending ? ?Mediastinal mass ?-Seen on CTA chest ?-3.3 x 2.2 cm discrete anterior mediastinal mass, potentially proteinaceous thymic cyst given intermittently density ?-Recommend MRI of the chest with and without contrast as outpatient ?-Patient to follow-up up with oncology as above ? ?Hypertension ?-Blood pressure is stable ?-Continue clonidine ? ?Paroxysmal atrial fibrillation ?-Eliquis on hold as patient is started on IV heparin as above ?-Continue amiodarone ?-Digoxin level 0.6; will restart digoxin 0.125 mg every other day ? ?Pacemaker ?-Chronic, stable in setting of tachybradycardia syndrome ? ?Pulmonary hypertension ?-We will follow echocardiogram results ? ?Community-acquired pneumonia ?-Seen  on CTA chest ?-Started on Rocephin and Zithromax ? ?CKD stage IIIb ?-Creatinine at baseline ? ? ?Medications ? ?  ? amiodarone  100 mg Oral Daily  ? cloNIDine  0.2 mg Oral TID  ? diazepam  2 mg Oral QHS  ? iohexol  500 mL Oral Q1H  ? pantoprazole  40 mg Oral Daily  ? ? ? Data Reviewed:  ? ?CBG: ? ?No results for input(s): GLUCAP in the last 168 hours. ? ?SpO2: 95 % ?O2 Flow Rate (L/min): 2 L/min  ? ? ?Vitals:  ? 03/25/21 0600 03/25/21 1030 03/25/21 1228 03/25/21 1230  ?BP: (!) 178/66 (!) 166/69 (!) 143/60   ?Pulse: 60 60 60 60  ?Resp: 19 16    ?Temp: 99 ?F (37.2 ?C) 99.1 ?F (37.3 ?C) 98.8 ?F (37.1 ?C)   ?TempSrc: Oral Oral Oral   ?SpO2: 93% 94% 90% 95%  ?Weight:      ?Height:      ? ? ? ? ?Data Reviewed: ? ?Basic Metabolic Panel: ?Recent Labs  ?Lab 03/24/21 ?1316 03/24/21 ?2014 03/25/21 ?0124  ?NA 134* 135 134*  ?K 3.8 3.7 3.8  ?CL 96* 98 98  ?CO2 '31 27 28  '$ ?GLUCOSE 155* 164* 111*  ?BUN 28* 24* 30*  ?CREATININE 1.46* 1.46* 1.66*  ?CALCIUM 8.8* 7.9* 7.9*  ?MG  --  2.3 2.4  ?PHOS  --  3.5 3.7  ? ? ?CBC: ?Recent Labs  ?Lab 03/24/21 ?1316 03/25/21 ?0124  ?WBC 9.1 9.9  ?NEUTROABS 6.8  --   ?HGB 11.8* 11.4*  ?HCT 37.1 35.8*  ?MCV 93.0 94.5  ?PLT 224 233  ? ? ?LFT ?Recent Labs  ?Lab 03/24/21 ?2014 03/25/21 ?0124  ?AST 20  18  ?ALT 13 12  ?ALKPHOS 93 92  ?BILITOT 0.7 0.8  ?PROT 6.5 6.6  ?ALBUMIN 2.9* 2.9*  ? ?  ?Antibiotics: ?Anti-infectives (From admission, onward)  ? ? Start     Dose/Rate Route Frequency Ordered Stop  ? 03/25/21 1400  azithromycin (ZITHROMAX) 500 mg in sodium chloride 0.9 % 250 mL IVPB       ? 500 mg ?250 mL/hr over 60 Minutes Intravenous Daily 03/24/21 1958 03/29/21 1359  ? 03/25/21 1400  cefTRIAXone (ROCEPHIN) 2 g in sodium chloride 0.9 % 100 mL IVPB       ? 2 g ?200 mL/hr over 30 Minutes Intravenous Every 24 hours 03/24/21 2001 03/29/21 1359  ? 03/24/21 2000  cefTRIAXone (ROCEPHIN) 2 g in sodium chloride 0.9 % 100 mL IVPB  Status:  Discontinued       ? 2 g ?200 mL/hr over 30 Minutes Intravenous  Daily-1800 03/24/21 1958 03/24/21 2001  ? 03/24/21 1530  levofloxacin (LEVAQUIN) IVPB 750 mg       ? 750 mg ?100 mL/hr over 90 Minutes Intravenous  Once 03/24/21 1519 03/24/21 1727  ? ?  ? ? ? ?DVT prophylaxis: Heparin ? ?Code Status: Full code ? ?Family Communication-discussed with daughter at bedside ? ? ?CONSULTS  ? ? ?Objective  ? ? ?Physical Examination: ? ?General-appears in no acute distress ?Heart-S1-S2, regular, no murmur auscultated ?Lungs-clear to auscultation bilaterally, no wheezing or crackles auscultated ?Abdomen-soft, nontender, no organomegaly ?Extremities-no edema in the lower extremities ?Neuro-alert, oriented x3, no focal deficit noted ? ?Status is: Inpatient: Pulmonary embolism ? ? ? ?  ? ? ?Oswald Hillock ?  ?[facility] ?If 7PM-7AM, please contact night-coverage at [email or web address], ?Office  [phone] ? ? ?03/25/2021, 3:04 PM  LOS: 0 days  ? ? ? ? ? ? ? ? ? ? ?  ?

## 2021-03-25 NOTE — Progress Notes (Signed)
SATURATION QUALIFICATIONS: (This note is used to comply with regulatory documentation for home oxygen) ? ?Patient Saturations on Room Air at Rest = 89 % ? ?Patient Saturations on Room Air while Ambulating =87 % ? ?Patient Saturations on 2 Liters of oxygen while Ambulating = 94 % ? ?Please briefly explain why patient needs home oxygen: ?Patient was assessed getting out of bed to chair.  ?

## 2021-03-26 DIAGNOSIS — J189 Pneumonia, unspecified organism: Secondary | ICD-10-CM | POA: Diagnosis not present

## 2021-03-26 DIAGNOSIS — I2699 Other pulmonary embolism without acute cor pulmonale: Secondary | ICD-10-CM | POA: Diagnosis not present

## 2021-03-26 DIAGNOSIS — I5032 Chronic diastolic (congestive) heart failure: Secondary | ICD-10-CM | POA: Diagnosis not present

## 2021-03-26 LAB — LEGIONELLA PNEUMOPHILA SEROGP 1 UR AG: L. pneumophila Serogp 1 Ur Ag: NEGATIVE

## 2021-03-26 LAB — CBC
HCT: 34.6 % — ABNORMAL LOW (ref 36.0–46.0)
Hemoglobin: 10.7 g/dL — ABNORMAL LOW (ref 12.0–15.0)
MCH: 29.5 pg (ref 26.0–34.0)
MCHC: 30.9 g/dL (ref 30.0–36.0)
MCV: 95.3 fL (ref 80.0–100.0)
Platelets: 231 10*3/uL (ref 150–400)
RBC: 3.63 MIL/uL — ABNORMAL LOW (ref 3.87–5.11)
RDW: 15.9 % — ABNORMAL HIGH (ref 11.5–15.5)
WBC: 10.8 10*3/uL — ABNORMAL HIGH (ref 4.0–10.5)
nRBC: 0 % (ref 0.0–0.2)

## 2021-03-26 LAB — HEPARIN LEVEL (UNFRACTIONATED): Heparin Unfractionated: >1.1 IU/mL — ABNORMAL HIGH (ref 0.30–0.70)

## 2021-03-26 LAB — APTT: aPTT: 88 seconds — ABNORMAL HIGH (ref 24–36)

## 2021-03-26 MED ORDER — ADULT MULTIVITAMIN W/MINERALS CH
1.0000 | ORAL_TABLET | Freq: Every day | ORAL | Status: DC
  Administered 2021-03-26 – 2021-03-28 (×3): 1 via ORAL
  Filled 2021-03-26 (×3): qty 1

## 2021-03-26 MED ORDER — APIXABAN 5 MG PO TABS: 5.0000 mg | ORAL_TABLET | Freq: Two times a day (BID) | ORAL | Status: DC

## 2021-03-26 MED ORDER — BOOST / RESOURCE BREEZE PO LIQD CUSTOM
1.0000 | Freq: Three times a day (TID) | ORAL | Status: DC
  Administered 2021-03-28: 1 via ORAL

## 2021-03-26 MED ORDER — APIXABAN 5 MG PO TABS
10.0000 mg | ORAL_TABLET | Freq: Two times a day (BID) | ORAL | Status: DC
  Administered 2021-03-26 – 2021-03-28 (×5): 10 mg via ORAL
  Filled 2021-03-26 (×5): qty 2

## 2021-03-26 NOTE — Progress Notes (Signed)
ANTICOAGULATION CONSULT NOTE  ? ?Pharmacy Consult for heparin ?Indication: pulmonary embolus ? ?Allergies  ?Allergen Reactions  ? Codeine Itching  ? Penicillins Itching, Rash and Other (See Comments)  ?  Has patient had a PCN reaction causing immediate rash, facial/tongue/throat swelling, SOB or lightheadedness with hypotension: Yes ?Has patient had a PCN reaction causing severe rash involving mucus membranes or skin necrosis: No ?Has patient had a PCN reaction that required hospitalization: No ?Has patient had a PCN reaction occurring within the last 10 years: No ?If all of the above answers are "NO", then may proceed with Cephalosporin use.  ? Doxazosin   ?  Heavy feeling in chest, congestion, wheezing  ? Doxycycline Other (See Comments)  ?  Cause chest tightness  ? Hydralazine   ?  Felt bad and could hear pulse in head   ? ? ?Patient Measurements: ?Height: 5' 5.5" (166.4 cm) ?Weight: 55.2 kg (121 lb 11.1 oz) ?IBW/kg (Calculated) : 58.15 ?Heparin Dosing Weight: TBW ? ?Vital Signs: ?Temp: 98.2 ?F (36.8 ?C) (03/20 2127) ?Temp Source: Oral (03/20 2127) ?BP: 183/77 (03/20 2127) ?Pulse Rate: 60 (03/20 2127) ? ?Labs: ?Recent Labs  ?  03/24/21 ?1316 03/24/21 ?1316 03/24/21 ?1518 03/24/21 ?2014 03/24/21 ?2203 03/25/21 ?0124 03/25/21 ?1112 03/25/21 ?2025 03/26/21 ?0425  ?HGB 11.8*  --   --   --   --  11.4*  --   --  10.7*  ?HCT 37.1  --   --   --   --  35.8*  --   --  34.6*  ?PLT 224  --   --   --   --  233  --   --  231  ?APTT  --    < >  --   --   --  59* 64* 108* 88*  ?HEPARINUNFRC  --   --   --   --   --  >1.10*  --  >1.10* >1.10*  ?CREATININE 1.46*  --   --  1.46*  --  1.66*  --   --   --   ?TROPONINIHS 44*  --  <2 40* 38*  --   --   --   --   ? < > = values in this interval not displayed.  ? ? ? ?Estimated Creatinine Clearance: 18.1 mL/min (A) (by C-G formula based on SCr of 1.66 mg/dL (H)). ? ? ?Assessment: ?21 yoF presenting with cough, SOB. PMH significant for atrial fibrillation, currently taking apixaban 2.5 mg  PO BID PTA (dose appropriately reduced for age >10, TBW < 60 kg while prescribed for afib).  ?CTA reveals nonocclusive thrombus within proximal segmental right lower lobe branch. Pharmacy consulted to dose heparin drip on admission for PE.  ? ?Last dose of apixaban 3/19 @ 10am ?No baseline HL, aPTT obtained ? ?03/26/21 ?aPTT = 88 seconds is therapeutic on 1050 units/hr ?Heparin level > 1.1 due to apixaban still on board ?No bleeding/infusion issues noted ?CBC:  Hgb slightly low but stable; Plt WNL & stable ? ?Monitor heparin using aPTT since HL falsely elevated due to DOAC ? ?Goal of Therapy:  ?Heparin level 0.3-0.7 units/ml ?aPTT 66-102 seconds ?Monitor platelets by anticoagulation protocol: Yes ?  ?Plan:  ?Continue heparin infusion at 1050 units/hr ?Check 8 hour aPTT/HL. Once they correlate, can monitor using HL only. ?CBC, HL/aPTT daily while on heparin infusion ?Monitor for signs of bleeding ? ?Dolly Rias RPh ?03/26/2021, 5:31 AM ? ?

## 2021-03-26 NOTE — Progress Notes (Signed)
SATURATION QUALIFICATIONS: (This note is used to comply with regulatory documentation for home oxygen) ? ?Patient Saturations on Room Air at Rest = 88% ? ?Patient Saturations on Room Air while Ambulating = 86% ? ?Patient Saturations on 2 Liters of oxygen while Ambulating = 94% ? ?Please briefly explain why patient needs home oxygen: ?

## 2021-03-26 NOTE — Discharge Instructions (Signed)
Information on my medicine - ELIQUIS? (apixaban) ? ?Why was Eliquis? prescribed for you? ?Eliquis? was prescribed to treat blood clots that may have been found in the veins of your legs (deep vein thrombosis) or in your lungs (pulmonary embolism) and to reduce the risk of them occurring again. ? ?What do You need to know about Eliquis? ? ?The starting dose is 10 mg (two 5 mg tablets) taken TWICE daily for the FIRST SEVEN (7) DAYS, then on (enter date)  04/02/21  the dose is reduced to ONE 5 mg tablet taken TWICE daily.  Eliquis? may be taken with or without food.  ? ?Try to take the dose about the same time in the morning and in the evening. If you have difficulty swallowing the tablet whole please discuss with your pharmacist how to take the medication safely. ? ?Take Eliquis? exactly as prescribed and DO NOT stop taking Eliquis? without talking to the doctor who prescribed the medication.  Stopping may increase your risk of developing a new blood clot.  Refill your prescription before you run out. ? ?After discharge, you should have regular check-up appointments with your healthcare provider that is prescribing your Eliquis?. ?   ?What do you do if you miss a dose? ?If a dose of ELIQUIS? is not taken at the scheduled time, take it as soon as possible on the same day and twice-daily administration should be resumed. The dose should not be doubled to make up for a missed dose. ? ?Important Safety Information ?A possible side effect of Eliquis? is bleeding. You should call your healthcare provider right away if you experience any of the following: ?Bleeding from an injury or your nose that does not stop. ?Unusual colored urine (red or dark brown) or unusual colored stools (red or black). ?Unusual bruising for unknown reasons. ?A serious fall or if you hit your head (even if there is no bleeding). ? ?Some medicines may interact with Eliquis? and might increase your risk of bleeding or clotting while on Eliquis?[name] To  help avoid this, consult your healthcare provider or pharmacist prior to using any new prescription or non-prescription medications, including herbals, vitamins, non-steroidal anti-inflammatory drugs (NSAIDs) and supplements. ? ?This website has more information on Eliquis? (apixaban): http://www.eliquis.com/eliquis/home ? ?

## 2021-03-26 NOTE — Progress Notes (Signed)
[facility] ? ?PROGRESS NOTE ? ?Allison Norman YTK:[id number] DOB: 1924-02-20 DOA: 03/24/2021 ?PCP: [name], MD ? ? ?Brief HPI:   ?88 year old female with history of atrial fibrillation on Eliquis, s/p pacemaker, suprapubic catheter presented with shortness of breath and cough.  She was found to be hypoxemic with O2 sats 88% on room air.  In the ED CTA chest showed pulmonary embolism.  Patient started on heparin protocol. ? ? ? ?Subjective  ? ?Patient seen and examined, feeling better today.  Still requiring oxygen 2 L/min. ? ? Assessment/Plan:  ? ? ? ?Acute hypoxemic respiratory failure ?-Sec continue PE ?-Patient was on Eliquis at a lower dose of 2.5 mg p.o. twice daily for atrial fibrillation ?-Discussed with hematology, Dr. Chryl Heck, patient dose of Eliquis will be changed to 5 mg twice a day for treatment for pulmonary embolism ?-Wean off oxygen as tolerated ? ?Pulmonary embolism ?-Seen on CTA chest ?-Venous duplex of lower extremities negative bilaterally ?-Started on IV heparin as above ?-We will start Eliquis 5 mg twice a day before discharge ?-As patient was on Eliquis 2.5 mg twice a day for A-fib, she was never getting recommended dose for Eliquis ?-Patient to follow-up with hematology/oncology, Dr. Chryl Heck as outpatient ?-Echocardiogram done, right ventricle function is normal.  Ejection fraction 60 to 65%. ?-Echocardiogram shows?  Prominent Chiari network and pacing wires in right atrium/right ventricle, cannot rule out right-sided source of embolus.  Right side atrial size mildly dilated.  Called and discussed these findings with Dr. Marlou Porch, he reviewed the echo.  No intervention recommended, he recommended to continue with full dose anticoagulation with Eliquis.  Probably she will need lifelong full dose Eliquis. ? ?Mediastinal mass ?-Seen on CTA chest ?-3.3 x 2.2 cm discrete anterior mediastinal mass, potentially proteinaceous thymic cyst given intermittently density ?-Recommend MRI of the  chest with and without contrast as outpatient ?-Patient to follow-up up with oncology as above ? ?Hypertension ?-Blood pressure is stable ?-Continue clonidine ? ?Paroxysmal atrial fibrillation ?-Eliquis on hold as patient is started on IV heparin as above ?-Continue amiodarone ?-Digoxin level 0.6; will restart digoxin 0.125 mg every other day ? ?Pacemaker ?-Chronic, stable in setting of tachybradycardia syndrome ? ?Pulmonary hypertension ?-We will follow echocardiogram results ? ?Community-acquired pneumonia ?-Seen on CTA chest ?-Started on Rocephin and Zithromax ? ?CKD stage IIIb ?-Creatinine at baseline ? ? ?Medications ? ?  ? amiodarone  100 mg Oral Daily  ? apixaban  10 mg Oral BID  ? Followed by  ? [START ON 04/02/2021] apixaban  5 mg Oral BID  ? cloNIDine  0.2 mg Oral TID  ? diazepam  2 mg Oral QHS  ? digoxin  0.125 mg Oral QODAY  ? feeding supplement  1 Container Oral TID BM  ? multivitamin with minerals  1 tablet Oral Daily  ? pantoprazole  40 mg Oral Daily  ? ? ? Data Reviewed:  ? ?CBG: ? ?No results for input(s): GLUCAP in the last 168 hours. ? ?SpO2: 97 % ?O2 Flow Rate (L/min): 2 L/min  ? ? ?Vitals:  ? 03/26/21 0549 03/26/21 1135 03/26/21 1136 03/26/21 1411  ?BP: (!) 168/88   138/63  ?Pulse: 60   60  ?Resp: 16   18  ?Temp: 99 ?F (37.2 ?C)   98.4 ?F (36.9 ?C)  ?TempSrc: Oral   Oral  ?SpO2: 93% (!) 86% 94% 97%  ?Weight:      ?Height:      ? ? ? ? ?Data Reviewed: ? ?  Basic Metabolic Panel: ?Recent Labs  ?Lab 03/24/21 ?1316 03/24/21 ?2014 03/25/21 ?0124  ?NA 134* 135 134*  ?K 3.8 3.7 3.8  ?CL 96* 98 98  ?CO2 '31 27 28  '$ ?GLUCOSE 155* 164* 111*  ?BUN 28* 24* 30*  ?CREATININE 1.46* 1.46* 1.66*  ?CALCIUM 8.8* 7.9* 7.9*  ?MG  --  2.3 2.4  ?PHOS  --  3.5 3.7  ? ? ?CBC: ?Recent Labs  ?Lab 03/24/21 ?1316 03/25/21 ?0124 03/26/21 ?0425  ?WBC 9.1 9.9 10.8*  ?NEUTROABS 6.8  --   --   ?HGB 11.8* 11.4* 10.7*  ?HCT 37.1 35.8* 34.6*  ?MCV 93.0 94.5 95.3  ?PLT 224 233 231  ? ? ?LFT ?Recent Labs  ?Lab 03/24/21 ?2014  03/25/21 ?0124  ?AST 20 18  ?ALT 13 12  ?ALKPHOS 93 92  ?BILITOT 0.7 0.8  ?PROT 6.5 6.6  ?ALBUMIN 2.9* 2.9*  ? ?  ?Antibiotics: ?Anti-infectives (From admission, onward)  ? ? Start     Dose/Rate Route Frequency Ordered Stop  ? 03/25/21 1400  azithromycin (ZITHROMAX) 500 mg in sodium chloride 0.9 % 250 mL IVPB       ? 500 mg ?250 mL/hr over 60 Minutes Intravenous Daily 03/24/21 1958 03/29/21 1359  ? 03/25/21 1400  cefTRIAXone (ROCEPHIN) 2 g in sodium chloride 0.9 % 100 mL IVPB       ? 2 g ?200 mL/hr over 30 Minutes Intravenous Every 24 hours 03/24/21 2001 03/29/21 1359  ? 03/24/21 2000  cefTRIAXone (ROCEPHIN) 2 g in sodium chloride 0.9 % 100 mL IVPB  Status:  Discontinued       ? 2 g ?200 mL/hr over 30 Minutes Intravenous Daily-1800 03/24/21 1958 03/24/21 2001  ? 03/24/21 1530  levofloxacin (LEVAQUIN) IVPB 750 mg       ? 750 mg ?100 mL/hr over 90 Minutes Intravenous  Once 03/24/21 1519 03/24/21 1727  ? ?  ? ? ? ?DVT prophylaxis: Heparin ? ?Code Status: Full code ? ?Family Communication-discussed with daughter at bedside ? ? ?CONSULTS  ? ? ?Objective  ? ? ?Physical Examination: ? ?General-appears in no acute distress ?Heart-S1-S2, regular, no murmur auscultated ?Lungs-clear to auscultation bilaterally, no wheezing or crackles auscultated ?Abdomen-soft, nontender, no organomegaly ?Extremities-no edema in the lower extremities ?Neuro-alert, oriented x3, no focal deficit noted ? ?Status is: Inpatient: Pulmonary embolism ? ? ? ?  ? ? ?Oswald Hillock ?  ?[facility] ?If 7PM-7AM, please contact night-coverage at [email or web address], ?Office  [phone] ? ? ?03/26/2021, 4:50 PM  LOS: 1 day  ? ? ? ? ? ? ? ? ? ? ?  ?

## 2021-03-26 NOTE — Progress Notes (Addendum)
Initial Nutrition Assessment ? ?DOCUMENTATION CODES:  ? ?Severe malnutrition in context of chronic illness ? ?INTERVENTION:  ?- Liberalize diet from a heart healthy to a regular diet to provide widest variety of menu options to enhance nutritional adequacy given malnutrition and poor PO intake ? ?- Boost Breeze po TID, each supplement provides 250 kcal and 9 grams of protein ? ?- Magic cup TID with meals, each supplement provides 290 kcal and 9 grams of protein ? ?- MVI with minerals daily ? ?NUTRITION DIAGNOSIS:  ? ?Severe Malnutrition related to chronic illness (afib, PPM) as evidenced by energy intake < or equal to 75% for > or equal to 1 month, percent weight loss, moderate fat depletion, severe muscle depletion. ? ?GOAL:  ? ?Patient will meet greater than or equal to 90% of their needs ? ?MONITOR:  ? ?PO intake, Supplement acceptance, Diet advancement, Labs, Weight trends ? ?REASON FOR ASSESSMENT:  ? ?Consult ?Assessment of nutrition requirement/status ? ?ASSESSMENT:  ? ?Pt admitted from home with cough and low oxygen saturation secondary to pulmonary emboli. PMH significant for afib on Eliquis, pacemaker, suprapubic catheter ? ?CTA chest findings of discrete anterior mediastinal mass, potentially proteinaceous thymic cyst given intermittently density ? ?Pt reports that since her admission to [facility] in September, she has not been eating as well and has lost her appetite. She does try to eat but it is very little. During admission, she reports eating </=50% of her meals. Previously she had tried Ensure and does not like these products. She is agreeable to receiving Boost Breeze and Magic cup to optimize nutritional intake.  ? ?Given she has had poor PO intake, weight loss, and malnutrition, pt would also benefit from a liberalized diet to provide widest variety of menu options to enhance nutritional adequacy.  ? ?For mobility, she uses a walker to get around. She reports having a health aid that assists her as  needed and encourages her to walk. ? ?Pt reports that she had a weight loss of 18 lbs after her last admission. She is unsure of her current weight. Reviewed weight history. Pt has had a 12% weight loss from 08/30/20-02/28/21, which is significant for time frame.  ? ?Medications: protonix, IV abx  ? ?Labs: sodium 134, BUN 30, Cr 1.66, correct ed calcium 8.78 ? ?NUTRITION - FOCUSED PHYSICAL EXAM: ? ?Flowsheet Row Most Recent Value  ?Orbital Region Moderate depletion  ?Upper Arm Region Moderate depletion  ?Thoracic and Lumbar Region Moderate depletion  ?Buccal Region Moderate depletion  ?Temple Region Mild depletion  ?Clavicle Bone Region Moderate depletion  ?Clavicle and Acromion Bone Region Severe depletion  ?Scapular Bone Region Severe depletion  ?Dorsal Hand Severe depletion  ?Patellar Region Severe depletion  ?Anterior Thigh Region Severe depletion  ?Posterior Calf Region Moderate depletion  ?Edema (RD Assessment) None  ?Hair Reviewed  ?Eyes Reviewed  ?Mouth Reviewed  ?Skin Reviewed  ?Nails Reviewed  ? ?  ? ? ?Diet Order:   ?Diet Order   ? ?       ?  Diet regular Room service appropriate? Yes; Fluid consistency: Thin  Diet effective now       ?  ? ?  ?  ? ?  ? ? ?EDUCATION NEEDS:  ? ?Education needs have been addressed ? ?Skin:  Skin Assessment: Reviewed RN Assessment ? ?Last BM:  3/19 ? ?Height:  ? ?Ht Readings from Last 1 Encounters:  ?03/24/21 5' 5.5" (1.664 m)  ? ? ?Weight:  ? ?Wt Readings from Last 1  Encounters:  ?03/24/21 55.2 kg  ? ?BMI:  Body mass index is 19.94 kg/m?. ? ?Estimated Nutritional Needs:  ? ?Kcal:  1450-1650 ? ?Protein:  70-85g ? ?Fluid:  >/=1.5L ? ?Clayborne Dana, RDN, LDN ?Clinical Nutrition ?

## 2021-03-26 NOTE — Progress Notes (Signed)
PT Cancellation Note ? ?Patient Details ?Name: Allison Norman ?MRN: [id number] ?DOB: [birth year] ? ? ?Cancelled Treatment:    Reason Eval/Treat Not Completed:  Pt declined participation with PT at this time. Will check back on tomorrow. ? ? ? ?Doreatha Massed, PT ?Acute Rehabilitation  ?Office: [phone] ?Pager: [phone] ? ?  ?

## 2021-03-27 DIAGNOSIS — J9601 Acute respiratory failure with hypoxia: Secondary | ICD-10-CM | POA: Diagnosis not present

## 2021-03-27 DIAGNOSIS — J189 Pneumonia, unspecified organism: Secondary | ICD-10-CM | POA: Diagnosis not present

## 2021-03-27 DIAGNOSIS — E43 Unspecified severe protein-calorie malnutrition: Secondary | ICD-10-CM | POA: Insufficient documentation

## 2021-03-27 DIAGNOSIS — I2699 Other pulmonary embolism without acute cor pulmonale: Secondary | ICD-10-CM | POA: Diagnosis not present

## 2021-03-27 DIAGNOSIS — I5032 Chronic diastolic (congestive) heart failure: Secondary | ICD-10-CM | POA: Diagnosis not present

## 2021-03-27 LAB — CBC
HCT: 34.4 % — ABNORMAL LOW (ref 36.0–46.0)
Hemoglobin: 10.2 g/dL — ABNORMAL LOW (ref 12.0–15.0)
MCH: 29.7 pg (ref 26.0–34.0)
MCHC: 29.7 g/dL — ABNORMAL LOW (ref 30.0–36.0)
MCV: 100 fL (ref 80.0–100.0)
Platelets: 224 10*3/uL (ref 150–400)
RBC: 3.44 MIL/uL — ABNORMAL LOW (ref 3.87–5.11)
RDW: 15.9 % — ABNORMAL HIGH (ref 11.5–15.5)
WBC: 10 10*3/uL (ref 4.0–10.5)
nRBC: 0 % (ref 0.0–0.2)

## 2021-03-27 LAB — CULTURE, RESPIRATORY W GRAM STAIN

## 2021-03-27 MED ORDER — CEFDINIR 300 MG PO CAPS
300.0000 mg | ORAL_CAPSULE | Freq: Two times a day (BID) | ORAL | Status: DC
Start: 2021-03-27 — End: 2021-03-27

## 2021-03-27 MED ORDER — AZITHROMYCIN 250 MG PO TABS
500.0000 mg | ORAL_TABLET | Freq: Every day | ORAL | Status: AC
Start: 2021-03-27 — End: 2021-03-28
  Administered 2021-03-27 – 2021-03-28 (×2): 500 mg via ORAL
  Filled 2021-03-27 (×2): qty 2

## 2021-03-27 MED ORDER — CEFDINIR 300 MG PO CAPS
300.0000 mg | ORAL_CAPSULE | Freq: Every day | ORAL | Status: AC
  Administered 2021-03-27 – 2021-03-28 (×2): 300 mg via ORAL
  Filled 2021-03-27 (×2): qty 1

## 2021-03-27 NOTE — Progress Notes (Signed)
PHARMACY NOTE:  ANTIMICROBIAL RENAL DOSAGE ADJUSTMENT ? ?Current antimicrobial regimen includes a mismatch between antimicrobial dosage and estimated renal function.  As per policy approved by the Pharmacy & Therapeutics and Medical Executive Committees, the antimicrobial dosage will be adjusted accordingly. ? ?Current antimicrobial dosage:  cefdinir 300 mg PO q12h x 4 doses  ? ?Indication: PNA ? ?Renal Function: ? ?Estimated Creatinine Clearance: 18.1 mL/min (A) (by C-G formula based on SCr of 1.66 mg/dL (H)). ?'[]'$      On intermittent HD, scheduled: ?'[]'$      On CRRT ?   ?Antimicrobial dosage has been changed to:   ?- Cefdinir 300 mg daily x 2 doses  ? ?Additional comments: ? ? ?Thank you for allowing pharmacy to be a part of this patient's care. ? ? ? ?Royetta Asal, PharmD, BCPS ?03/27/2021 9:40 AM ? ?  ? ?

## 2021-03-27 NOTE — Progress Notes (Signed)
SATURATION QUALIFICATIONS: (This note is used to comply with regulatory documentation for home oxygen) ? ?Patient Saturations on Room Air at Rest = 86% ? ?Patient Saturations on Room Air while Ambulating = 86% ? ?Patient Saturations on 2 Liters of oxygen while Ambulating = 91% ? ?Please briefly explain why patient needs home oxygen:Pt is still requiring 2L of O2 at rest and with ambulation.   ?

## 2021-03-27 NOTE — TOC Progression Note (Signed)
Transition of Care (TOC) - Progression Note  ? ? ?Patient Details  ?Name: Allison Norman ?MRN: [id number] ?Date of Birth: 17-Jun-1924 ? ?Transition of Care (TOC) CM/SW Contact  ?Tawanna Cooler, RN ?Phone Number: ?03/27/2021, 3:47 PM ? ?Clinical Narrative:    ? ?Spoke with patient's daughter, Izora Gala.  Choice for home O2 is [employer].  Choice for home health is [facility].  No orders yet, but plan is to discharge tomorrow.   ? ?

## 2021-03-27 NOTE — Progress Notes (Signed)
[facility] ?PROGRESS NOTE ? ? ? ?Progress Note  ?Allison Norman  SJG:[id number] DOB: 07/07/1924 DOA: 03/24/2021 ?PCP: [name], MD  ? ? ? ?Brief Narrative:  ? ?Allison Norman is an 88 y.o. female past medical history of atrial fibrillation on Eliquis status post pacemaker, suprapubic catheter presents with shortness of breath and cough was found hypoxemic in the ED CTA of the chest showed pulmonary embolism she was started on IV heparin ? ? ?Assessment/Plan:  ? ?Acute respiratory failure with hypoxia secondarily to  Pulmonary emboli (HCC) ?Patient was on Eliquis at home for atrial fibrillation. ?The case was discussed with hematologist who recommended to increase Eliquis to 5 mg twice a day, she has been weaned off oxygen. ?2D echo showed an EF of 60%. ?Try to keep the patient out of bed to chair, consult physical therapy recommend incentive spirometry. ?She still requiring 2 L of oxygen to keep saturations greater than 96%. ? ?Mediastinal mass: ?Seen on CT of the chest that showed 3 x 2 discrete anterior mass potential thymic cyst given density.  An MRI was recommended as an outpatient with and without contrast we will follow-up with oncology. ? ?Essential hypertension: ?Continue clonidine blood pressure stable. ? ?Paroxysmal atrial fibrillation: ?Continue amiodarone digoxin and Eliquis. ? ?History of pacemaker  Tachycardia-bradycardia syndrome ([place]) ?Stable. ? ?Pulmonary hypertension: ?Noted. ? ?Community-acquired pneumonia: ?Seen on CT of the chest she was started on Rocephin and azithromycin. ?We will transition her to oral Omnicef and azithromycin. ? ?Chronic kidney disease stage IIIb: ?His creatinine appears to be at baseline. ? ?Iron deficiency anemia ?Noted follow-up with hematology as an outpatient. ?  ?Protein-calorie malnutrition, severe ?Noted ? ? ?DVT prophylaxis: lovenox ?Family Communication:none ?Status is: Inpatient ?Remains inpatient appropriate because: Acute PE ? ? ? ?Code  Status:  ? ?  ?Code Status Orders  ?(From admission, onward)  ?  ? ? ?  ? ?  Start     Ordered  ? 03/24/21 2018  Full code  Continuous       ? 03/24/21 2019  ? ?  ?  ? ?  ? ?Code Status History   ? ? Date Active Date Inactive Code Status Order ID Comments User Context  ? 09/05/2020 1831 09/09/2020 2231 Full Code [id number]  Oswald Hillock, MD ED  ? 08/27/2020 2251 09/01/2020 1233 Full Code [id number]  Rhetta Mura, DO ED  ? 07/22/2019 0904 07/22/2019 2244 Full Code [id number]  Evans Lance, MD Inpatient  ? 08/19/2016 0139 08/20/2016 1753 Full Code [id number]  Minus Breeding, MD ED  ? ?  ? ?Advance Directive Documentation   ? ?Flowsheet Row Most Recent Value  ?Type of Advance Directive Living will  ?Pre-existing out of facility DNR order (yellow form or pink MOST form) --  ?"MOST" Form in Place? --  ? ?  ? ? ? ? ?IV Access:  ? ?Peripheral IV ? ? ?Procedures and diagnostic studies:  ? ?CT ABDOMEN PELVIS WO CONTRAST ? ?Result Date: 03/25/2021 ?CLINICAL DATA:  Abnormal chest CT.  Metastatic disease evaluation. EXAM: CT ABDOMEN AND PELVIS WITHOUT CONTRAST TECHNIQUE: Multidetector CT imaging of the abdomen and pelvis was performed following the standard protocol without IV contrast. RADIATION DOSE REDUCTION: This exam was performed according to the departmental dose-optimization program which includes automated exposure control, adjustment of the mA and/or kV according to patient size and/or use of iterative reconstruction technique. COMPARISON:  CT angiogram chest 03/24/2021. CT abdomen and pelvis 08/30/2020. FINDINGS: Lower chest: There are  patchy airspace opacities in the lung bases as seen on chest CT. The heart is enlarged. Hepatobiliary: There is a rounded hypodensity in the left lobe of the liver which is too small to characterize, likely a cyst or hemangioma. Otherwise, the liver, gallbladder and bile ducts are within normal limits. Pancreas: Unremarkable. No pancreatic ductal dilatation or surrounding inflammatory  changes. Spleen: Numerous calcified granulomas are again seen. The spleen is otherwise within normal limits. Adrenals/Urinary Tract: There are renal cortical hypodensities in both kidneys which are too small to characterize, likely cysts. There is a rounded 0.5 cm low-attenuation lesion in the right kidney measuring 15 Hounsfield units favored as a cyst or mildly complex cysts. There is no hydronephrosis or urinary tract calculus. The adrenal glands are within normal limits. Suprapubic Foley catheter decompresses the bladder. Stomach/Bowel: Stomach is within normal limits. Appendix appears normal. There is no bowel wall thickening or inflammation. The colon is markedly redundant. There is large stool burden in the proximal colon. There is gaseous distention of the distal colon. Vascular/Lymphatic: Aortic atherosclerosis. No enlarged abdominal or pelvic lymph nodes. Reproductive: Uterus and bilateral adnexa are unremarkable. Other: No abdominal wall hernia or abnormality. No abdominopelvic ascites. Musculoskeletal: No fracture is seen. IMPRESSION: 1. No definite evidence for metastatic disease in the abdomen or pelvis. 2. No acute localizing process in the abdomen or pelvis. 3. Likely mildly complex cyst in the right kidney. 4. Suprapubic Foley catheter decompresses the bladder. Electronically Signed   By: Ronney Asters M.D.   On: 03/25/2021 18:47  ? ?ECHOCARDIOGRAM COMPLETE ? ?Result Date: 03/25/2021 ?   ECHOCARDIOGRAM REPORT   Patient Name:   Allison Norman Date of Exam: 03/25/2021 Medical Rec #:  [id number]       Height:       65.5 in Accession #:    [id number]      Weight:       121.7 lb Date of Birth:  05/13/1924       BSA:          1.611 m? Patient Age:    44 years        BP:           166/69 mmHg Patient Gender: F               HR:           60 bpm. Exam Location:  Inpatient Procedure: 2D Echo, Cardiac Doppler and Color Doppler Indications:    Pulmonary embolus  History:        Patient has prior history of  Echocardiogram examinations.                 Arrythmias:Atrial Fibrillation; Risk Factors:Hypertension.  Sonographer:    Jyl Heinz Referring Phys: [place]  1. Left ventricular ejection fraction, by estimation, is 60 to 65%. The left ventricle has normal function. The left ventricle has no regional wall motion abnormalities. There is mild left ventricular hypertrophy. Left ventricular diastolic parameters were normal.  2. Right ventricular systolic function is normal. The right ventricular size is normal.  3. Left atrial size was moderately dilated.  4. ? prominent [employer] and pacing wires in RA/RV Cannot r/o right sided source of embolus . Right atrial size was mildly dilated.  5. The mitral valve is degenerative. Mild mitral valve regurgitation. No evidence of mitral stenosis. Moderate mitral annular calcification.  6. Sclerotic and calcified non coronary cusp. The aortic valve is normal in structure. There is  moderate calcification of the aortic valve. There is moderate thickening of the aortic valve. Aortic valve regurgitation is not visualized. Mild to moderate aortic valve stenosis.  7. The inferior vena cava is normal in size with greater than 50% respiratory variability, suggesting right atrial pressure of 3 mmHg. FINDINGS  Left Ventricle: Left ventricular ejection fraction, by estimation, is 60 to 65%. The left ventricle has normal function. The left ventricle has no regional wall motion abnormalities. The left ventricular internal cavity size was normal in size. There is  mild left ventricular hypertrophy. Left ventricular diastolic parameters were normal. Right Ventricle: The right ventricular size is normal. No increase in right ventricular wall thickness. Right ventricular systolic function is normal. Left Atrium: Left atrial size was moderately dilated. Right Atrium: ? prominent Chi[employer] and pacing wires in RA/RV Cannot r/o right sided source of embolus. Right  atrial size was mildly dilated. Pericardium: There is no evidence of pericardial effusion. Mitral Valve: The mitral valve is degenerative in appearance. There is moderate thickening of the mitral valve leaflet(s).

## 2021-03-27 NOTE — Progress Notes (Signed)
Pt ambulated to bathroom (~25 ft) on RA with O2 sat dropping from 95% to 92%. Pt brushed teeth and O2 dropped to 83%. RN replaced pt's [place] 2L and O2 went to 90%. Pt returned to bed and saturations were mid 90s. ?

## 2021-03-27 NOTE — Evaluation (Signed)
Clinical/Bedside Swallow Evaluation ?Patient Details  ?Name: CASON DABNEY ?MRN: [id number] ?Date of Birth: 1924/04/15 ? ?Today's Date: 03/27/2021 ?Time: SLP Start Time (ACUTE ONLY): 1228 SLP Stop Time (ACUTE ONLY): 1240 ?SLP Time Calculation (min) (ACUTE ONLY): 12 min ? ?Past Medical History:  ?Past Medical History:  ?Diagnosis Date  ? Atrial fibrillation ([place]) 2017  ? a. s/p DCCV in 10/2015  b. recurrent in 08/2016 --> rate-control pursued.   ? Cancer [facility])   ? Breast  ? CKD (chronic kidney disease)   ? GCA (giant cell arteritis) ([place])   ? Hordeolum internum left lower eyelid 06/28/2019  ? Hypertension   ? Macular degeneration   ? Osteoporosis   ? PMR (polymyalgia rheumatica) (HCC)   ? Pulmonary hypertension, unspecified ([place]) 02/14/2021  ? Resistant hypertension 03/15/2019  ? Shortness of breath 03/15/2019  ? Skin cancer   ? ?Past Surgical History:  ?Past Surgical History:  ?Procedure Laterality Date  ? CARDIOVERSION N/A 10/18/2015  ? Procedure: CARDIOVERSION;  Surgeon: Jerline Pain, MD;  Location: [place];  Service: Cardiovascular;  Laterality: N/A;  ? CARDIOVERSION N/A 02/20/2017  ? Procedure: CARDIOVERSION;  Surgeon: Pixie Casino, MD;  Location: [place];  Service: Cardiovascular;  Laterality: N/A;  ? CARDIOVERSION N/A 06/18/2017  ? Procedure: CARDIOVERSION;  Surgeon: Sanda Klein, MD;  Location: [place];  Service: Cardiovascular;  Laterality: N/A;  ? CARDIOVERSION N/A 12/21/2018  ? Procedure: CARDIOVERSION;  Surgeon: Thayer Headings, MD;  Location: [place];  Service: Cardiovascular;  Laterality: N/A;  ? IR CATHETER TUBE CHANGE  02/28/2021  ? KNEE SURGERY  2013  ? MASTECTOMY  1998  ? left side  ? PACEMAKER IMPLANT N/A 07/22/2019  ? Procedure: PACEMAKER IMPLANT;  Surgeon: Evans Lance, MD;  Location: [place] [facility];  Service: Cardiovascular;  Laterality: N/A;  ? ?HPI:  ?MICHELA HERST is an 88 y.o. female past medical history of atrial fibrillation, pacemaker, suprapubic catheter  presents with shortness of breath and cough was found hypoxemic in the ED CTA of the chest showed pulmonary embolism. Chest CT showed a mass.  ?  ?Assessment / Plan / Recommendation  ?Clinical Impression ? Pt states she has a history of reflux, takes meds and tends to cough after she eats and had a coughing spell after breakfast. She did have immediate throat clears and coughed during assessment intermittently,difficult to differentiate from baseline coughing with current pna. Suspect s/sx are from possible esophageal source and her pneumonia and pt state she did not want to undergo more testing as with MBS. Pt scheduled to be discharged today with her caregivers. Educated pt and granddaughter if symptoms persist once home or if have recurrent pna, an MBS would be beneficial. No further ST needed. Recommend continue regular/thin and esophageal precautions. ?SLP Visit Diagnosis: Dysphagia, unspecified (R13.10) ?   ?Aspiration Risk ? Mild aspiration risk  ?  ?Diet Recommendation Regular;Thin liquid  ? ?Liquid Administration via: Cup;Straw ?Medication Administration: Whole meds with liquid ?Supervision: Patient able to self feed ?Compensations: Slow rate;Small sips/bites ?Postural Changes: Seated upright at 90 degrees;Remain upright for at least 30 minutes after po intake  ?  ?Other  Recommendations Oral Care Recommendations: Oral care BID   ? ?Recommendations for follow up therapy are one component of a multi-disciplinary discharge planning process, led by the attending physician.  Recommendations may be updated based on patient status, additional functional criteria and insurance authorization. ? ?Follow up Recommendations No SLP follow up  ? ? ?  ?Assistance Recommended  at Discharge None  ?Functional Status Assessment    ?Frequency and Duration    ?  ?  ?   ? ?Prognosis    ? ?  ? ?Swallow Study   ?General Date of Onset: 03/24/21 ?HPI: CHAMPAGNE PALETTA is an 88 y.o. female past medical history of atrial fibrillation,  pacemaker, suprapubic catheter presents with shortness of breath and cough was found hypoxemic in the ED CTA of the chest showed pulmonary embolism. Chest CT showed a mass. ?Type of Study: Bedside Swallow Evaluation ?Previous Swallow Assessment:  (no) ?Diet Prior to this Study: Regular;Thin liquids ?Temperature Spikes Noted: No ?Respiratory Status: Nasal cannula ?History of Recent Intubation: No ?Behavior/Cognition: Alert;Cooperative;Pleasant mood ?Oral Cavity Assessment: Within Functional Limits ?Oral Care Completed by SLP: No ?Oral Cavity - Dentition: Adequate natural dentition ?Vision: Functional for self-feeding ?Self-Feeding Abilities: Able to feed self ?Patient Positioning: Upright in chair ?Baseline Vocal Quality: Normal ?Volitional Cough: Strong ?Volitional Swallow: Able to elicit  ?  ?Oral/Motor/Sensory Function Overall Oral Motor/Sensory Function: Within functional limits   ?Ice Chips Ice chips: Not tested   ?Thin Liquid Thin Liquid: Impaired ?Presentation: Straw ?Oral Phase Impairments:  (none) ?Oral Phase Functional Implications:  (none) ?Pharyngeal  Phase Impairments: Throat Clearing - Immediate;Cough - Delayed  ?  ?Nectar Thick Nectar Thick Liquid: Not tested   ?Honey Thick Honey Thick Liquid: Not tested   ?Puree Puree: Not tested   ?Solid ? ? ?  Solid: Within functional limits  ? ?  ? ?Houston Siren ?03/27/2021,12:56 PM ? ? ? ?

## 2021-03-27 NOTE — Evaluation (Signed)
Physical Therapy Evaluation ?Patient Details ?Name: Allison Norman ?MRN: [id number] ?DOB: 1924/09/21 ?Today's Date: 03/27/2021 ? ?History of Present Illness ? 88 yo female admitted with acute PE. Hx of Afib, pacemaker, CKD, suprapubic cathether, polymyalgia rheumatica  ?Clinical Impression ? On eval, pt was Min guard assist for mobility. She walked ~20 feet around the room with a RW. O2 91% on 2L. Pt reported feeling fatigued. She required encouragement for participation with PT. Recommend HHPT f/u. Will follow during hospital stay. Encouraged pt to sit up as tolerated and to try to walk with nursing/family later today if able.    ?   ? ?Recommendations for follow up therapy are one component of a multi-disciplinary discharge planning process, led by the attending physician.  Recommendations may be updated based on patient status, additional functional criteria and insurance authorization. ? ?Follow Up Recommendations Home health PT ? ?  ?Assistance Recommended at Discharge Frequent or constant Supervision/Assistance  ?Patient can return home with the following ? A little help with bathing/dressing/bathroom;Assistance with cooking/housework;Assist for transportation;Help with stairs or ramp for entrance ? ?  ?Equipment Recommendations None recommended by PT  ?Recommendations for Other Services ?    ?  ?Functional Status Assessment Patient has had a recent decline in their functional status and demonstrates the ability to make significant improvements in function in a reasonable and predictable amount of time.  ? ?  ?Precautions / Restrictions Precautions ?Precautions: Fall ?Precaution Comments: monitor O2 ?Restrictions ?Weight Bearing Restrictions: No  ? ?  ? ?Mobility ? Bed Mobility ?  ?  ?  ?  ?  ?  ?  ?General bed mobility comments: oob in recliner ?  ? ?Transfers ?Overall transfer level: Needs assistance ?Equipment used: Rolling walker (2 wheels) ?Transfers: Sit to/from Stand ?Sit to Stand: Min guard ?  ?  ?  ?  ?   ?General transfer comment: Increased time and effort for pt. Unsteady but no overt LOB. ?  ? ?Ambulation/Gait ?Ambulation/Gait assistance: Min guard ?Gait Distance (Feet): 20 Feet ?Assistive device: Rolling walker (2 wheels) ?Gait Pattern/deviations: Step-through pattern, Decreased stride length ?  ?  ?  ?General Gait Details: Min guard for safety. Remained on 2L [place] O2-sats 91%. Pt declined to ambulate in the hallway but she did walk around the room ? ?Stairs ?  ?  ?  ?  ?  ? ?Wheelchair Mobility ?  ? ?Modified Rankin (Stroke Patients Only) ?  ? ?  ? ?Balance Overall balance assessment: Needs assistance ?  ?  ?  ?  ?Standing balance support: Bilateral upper extremity supported, Reliant on assistive device for balance ?Standing balance-Leahy Scale: Fair ?  ?  ?  ?  ?  ?  ?  ?  ?  ?  ?  ?  ?   ? ? ? ?Pertinent Vitals/Pain Pain Assessment ?Pain Assessment: Faces ?Faces Pain Scale: Hurts little more ?Pain Location: UEs, soreness from coughing ?Pain Descriptors / Indicators: Discomfort, Sore ?Pain Intervention(s): Limited activity within patient's tolerance, Monitored during session, Repositioned  ? ? ?Home Living Family/patient expects to be discharged to:: Private residence ?Living Arrangements: Alone ?Available Help at Discharge: Family;Personal care attendant;Available 24 hours/day ?Type of Home: House ?Home Access: Stairs to enter ?  ?Entrance Stairs-Number of Steps: 2 ?  ?Home Layout: One level ?Home Equipment: Grab bars - tub/shower;Rollator (4 wheels);Cane - single Optometrist (2 wheels) ?   ?  ?Prior Function Prior Level of Function : Needs assist ?  ?  ?  ?  ?  ?  ?  Mobility Comments: uses RW with Mod Ind ?ADLs Comments: assist with bathing, dressing, household tasks, driving, grocery shopping ?  ? ? ?Hand Dominance  ?   ? ?  ?Extremity/Trunk Assessment  ? Upper Extremity Assessment ?Upper Extremity Assessment: Generalized weakness ?  ? ?Lower Extremity Assessment ?Lower Extremity  Assessment: Generalized weakness ?  ? ?Cervical / Trunk Assessment ?Cervical / Trunk Assessment: Kyphotic  ?Communication  ? Communication: No difficulties  ?Cognition Arousal/Alertness: Awake/alert ?Behavior During Therapy: [facility] for tasks assessed/performed ?Overall Cognitive Status: Within Functional Limits for tasks assessed ?  ?  ?  ?  ?  ?  ?  ?  ?  ?  ?  ?  ?  ?  ?  ?  ?  ?  ?  ? ?  ?General Comments   ? ?  ?Exercises    ? ?Assessment/Plan  ?  ?PT Assessment Patient needs continued PT services  ?PT Problem List Decreased strength;Decreased mobility;Decreased activity tolerance;Decreased balance;Decreased knowledge of use of DME ? ?   ?  ?PT Treatment Interventions DME instruction;Gait training;Therapeutic exercise;Balance training;Functional mobility training;Therapeutic activities;Patient/family education   ? ?PT Goals (Current goals can be found in the Care Plan section)  ?Acute Rehab PT Goals ?Patient Stated Goal: to feel better and go home ?PT Goal Formulation: With patient/family ?Time For Goal Achievement: 04/10/21 ?Potential to Achieve Goals: Good ? ?  ?Frequency Min 3X/week ?  ? ? ?Co-evaluation   ?  ?  ?  ?  ? ? ?  ?AM-PAC PT "6 Clicks" Mobility  ?Outcome Measure Help needed turning from your back to your side while in a flat bed without using bedrails?: A Little ?Help needed moving from lying on your back to sitting on the side of a flat bed without using bedrails?: A Little ?Help needed moving to and from a bed to a chair (including a wheelchair)?: A Little ?Help needed standing up from a chair using your arms (e.g., wheelchair or bedside chair)?: A Little ?Help needed to walk in hospital room?: A Little ?Help needed climbing 3-5 steps with a railing? : A Little ?6 Click Score: 18 ? ?  ?End of Session Equipment Utilized During Treatment: Oxygen ?Activity Tolerance: Patient limited by fatigue ?Patient left: in chair;with call bell/phone within reach;with family/visitor present;with chair alarm set ?   ?PT Visit Diagnosis: Muscle weakness (generalized) (M62.81);Difficulty in walking, not elsewhere classified (R26.2) ?  ? ?Time: 1000-1016 ?PT Time Calculation (min) (ACUTE ONLY): 16 min ? ? ?Charges:   PT Evaluation ?$PT Eval Moderate Complexity: 1 Mod ?  ?  ?   ? ? ? ? ? ?Sylina Henion P, PT ?Acute Rehabilitation  ?Office: [phone] ?Pager: [phone] ? ?  ? ?

## 2021-03-28 DIAGNOSIS — I2699 Other pulmonary embolism without acute cor pulmonale: Secondary | ICD-10-CM | POA: Diagnosis not present

## 2021-03-28 DIAGNOSIS — J189 Pneumonia, unspecified organism: Secondary | ICD-10-CM | POA: Diagnosis not present

## 2021-03-28 DIAGNOSIS — I5032 Chronic diastolic (congestive) heart failure: Secondary | ICD-10-CM | POA: Diagnosis not present

## 2021-03-28 DIAGNOSIS — J9601 Acute respiratory failure with hypoxia: Secondary | ICD-10-CM | POA: Diagnosis not present

## 2021-03-28 LAB — CBC WITH DIFFERENTIAL/PLATELET
Abs Immature Granulocytes: 0.04 10*3/uL (ref 0.00–0.07)
Basophils Absolute: 0 10*3/uL (ref 0.0–0.1)
Basophils Relative: 1 %
Eosinophils Absolute: 0.4 10*3/uL (ref 0.0–0.5)
Eosinophils Relative: 5 %
HCT: 33.9 % — ABNORMAL LOW (ref 36.0–46.0)
Hemoglobin: 10.5 g/dL — ABNORMAL LOW (ref 12.0–15.0)
Immature Granulocytes: 1 %
Lymphocytes Relative: 14 %
Lymphs Abs: 1.2 10*3/uL (ref 0.7–4.0)
MCH: 30 pg (ref 26.0–34.0)
MCHC: 31 g/dL (ref 30.0–36.0)
MCV: 96.9 fL (ref 80.0–100.0)
Monocytes Absolute: 1.1 10*3/uL — ABNORMAL HIGH (ref 0.1–1.0)
Monocytes Relative: 13 %
Neutro Abs: 5.5 10*3/uL (ref 1.7–7.7)
Neutrophils Relative %: 66 %
Platelets: 215 10*3/uL (ref 150–400)
RBC: 3.5 MIL/uL — ABNORMAL LOW (ref 3.87–5.11)
RDW: 15.9 % — ABNORMAL HIGH (ref 11.5–15.5)
WBC: 8.2 10*3/uL (ref 4.0–10.5)
nRBC: 0 % (ref 0.0–0.2)

## 2021-03-28 MED ORDER — AZITHROMYCIN 250 MG PO TABS
ORAL_TABLET | ORAL | 0 refills | Status: DC
Start: 2021-03-28 — End: 2021-06-20

## 2021-03-28 MED ORDER — CEFDINIR 300 MG PO CAPS: 300.0000 mg | ORAL_CAPSULE | Freq: Every day | ORAL | 0 refills | Status: AC

## 2021-03-28 MED ORDER — APIXABAN 5 MG PO TABS: 5.0000 mg | ORAL_TABLET | Freq: Two times a day (BID) | ORAL | 3 refills | Status: DC

## 2021-03-28 MED ORDER — APIXABAN 5 MG PO TABS: 10.0000 mg | ORAL_TABLET | Freq: Two times a day (BID) | ORAL | 0 refills | Status: DC

## 2021-03-28 NOTE — Progress Notes (Signed)
Pt to be discharged to home today. Pt and Pt's Daughter given discharge teaching including all discharge Medications and schedules for these Medications. Pt and Family verbalized understanding of all discharge teaching. Discharge AVS with Pt and family at time of discharge  ?

## 2021-03-28 NOTE — Discharge Summary (Signed)
Physician Discharge Summary  ?[name] BJS:[id number] DOB: [birth year] DOA: 03/24/2021 ? ?PCP: Allison Cruel, MD ? ?Admit date: 03/24/2021 ?Discharge date: 03/28/2021 ? ?Admitted From: Home ?Disposition:  Home ? ?Recommendations for Outpatient Follow-up:  ?Follow up with PCP in 1-2 weeks ?Please obtain as an outpatient an MRI of the chest to evaluate for mediastinal mass possible thymic mass. ? ? ?Home Health:Yes ?Equipment/Devices:None ? ?Discharge Condition:Stable ?CODE STATUS:Full ?Diet recommendation: Heart Healthy ? ?Brief/Interim Summary: ?88 y.o. female past medical history of atrial fibrillation on Eliquis status post pacemaker, suprapubic catheter presents with shortness of breath and cough was found hypoxemic in the ED CTA of the chest showed pulmonary embolism she was started on IV heparin ?  ? ?Discharge Diagnoses:  ?Principal Problem: ?  Pulmonary emboli (Allison Norman) ?Active Problems: ?  Acute respiratory failure with hypoxia (Allison Norman) ?  Acute pulmonary embolism (Allison Norman) ?  CAP (community acquired pneumonia) ?  Essential hypertension ?  PAF (paroxysmal atrial fibrillation) (Allison Norman) ?  Resistant hypertension ?  Stage 3b chronic kidney disease (CKD) (Allison Norman) ?  Iron deficiency anemia ?  Tachycardia-bradycardia syndrome (Allison Norman) ?  Pacemaker ?  Pulmonary hypertension, unspecified (Allison Norman) ?  Pulmonary nodule ?  Chronic diastolic CHF (congestive heart failure) (Allison Norman) ?  Protein-calorie malnutrition, severe ? ?Acute respiratory failure with hypoxia secondary to pulmonary emboli: ?She was started on IV heparin, low-dose Eliquis was held. ?The case was discussed with hematology who recommended to increase Eliquis to 5 mg twice daily. ?2D echo was done that showed an EF of 60%. ?She was ambulated and saturations deteriorated, to less than 88% and she will go home on 1 L of oxygen. ?Physical therapy evaluated the patient, she will go home with home health PT. ? 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 ?She will need an MRI as an outpatient. ? ?Essential hypertension: ?No changes made to her medication. ? ?Paroxysmal atrial fibrillation: ?Continue amiodarone, digoxin and Eliquis. ? ?History of pacemaker tachybradycardia syndrome: ?Note noted no changes made to her medication. ? ?Pulmonary hypertension: ?Noted. ? ?Community-acquired pneumonia: ?Seen on CT of the chest noted on Rocephin and azithromycin she was transitioned to oral Omnicef and azithromycin which she will continue for 3 more days as an outpatient. ? ?Chronic kidney disease stage IIIb: ?Her creatinine remained at baseline no changes made. ? ?Iron deficiency anemia: ?Follow-up with hematology as an outpatient. ? ?Severe protein caloric malnutrition: ?Noted. ? ? ?Discharge Instructions ? ?Discharge Instructions   ? ? Diet - low sodium heart healthy   Complete by: As directed ?  ? Increase activity slowly   Complete by: As directed ?  ? ?  ? ?Allergies as of 03/28/2021   ? ?   Reactions  ? Codeine Itching  ? Penicillins Itching, Rash, Other (See Comments)  ? Has patient had a PCN reaction causing immediate rash, facial/tongue/throat swelling, SOB or lightheadedness with hypotension: Yes ?Has patient had a PCN reaction causing severe rash involving mucus membranes or skin necrosis: No ?Has patient had a PCN reaction that required hospitalization: No ?Has patient had a PCN reaction occurring within the last 10 years: No ?If all of the above answers are "NO", then may proceed with Cephalosporin use.  ? Doxazosin   ? Heavy feeling in chest, congestion, wheezing  ? Doxycycline Other (See Comments)  ? Cause chest tightness  ? Hydralazine   ? Felt bad and could hear pulse in head   ? ?  ? ?  ?  Medication List  ?  ? ?TAKE these medications   ? ?acetaminophen 500 MG tablet ?Commonly known as: TYLENOL ?Take 1,000 mg by mouth every 6 (six) hours as needed for moderate pain. ?  ?amiodarone 200 MG tablet ?Commonly known as:  PACERONE ?Take 1/2 (one-half) tablet by mouth once daily ?What changed:  ?how much to take ?how to take this ?when to take this ?additional instructions ?  ?apixaban 5 MG Tabs tablet ?Commonly known as: ELIQUIS ?Take 2 tablets (10 mg total) by mouth 2 (two) times daily for 5 days. ?What changed:  ?medication strength ?how much to take ?  ?apixaban 5 MG Tabs tablet ?Commonly known as: ELIQUIS ?Take 1 tablet (5 mg total) by mouth 2 (two) times daily. ?Start taking on: April 02, 2021 ?What changed: You were already taking a medication with the same name, and this prescription was added. Make sure you understand how and when to take each. ?  ?azithromycin 250 MG tablet ?Commonly known as: ZITHROMAX ?Take a tab daily ?  ?carvedilol 12.5 MG tablet ?Commonly known as: COREG ?Take 1 tablet (12.5 mg total) by mouth 2 (two) times daily with a meal. ?  ?cefdinir 300 MG capsule ?Commonly known as: OMNICEF ?Take 1 capsule (300 mg total) by mouth daily for 3 days. ?  ?cloNIDine 0.2 MG tablet ?Commonly known as: CATAPRES ?Take 1 tablet (0.2 mg total) by mouth 3 (three) times daily. ?What changed:  ?how much to take ?additional instructions ?  ?dextromethorphan 30 MG/5ML liquid ?Commonly known as: DELSYM ?Take 30 mg by mouth daily as needed for cough. ?  ?diazepam 2 MG tablet ?Commonly known as: VALIUM ?Take 2 mg by mouth at bedtime as needed for anxiety. ?  ?digoxin 0.125 MG tablet ?Commonly known as: LANOXIN ?Take 1 tablet (0.125 mg total) by mouth every other day. ?  ?Eylea 2 MG/0.05ML Soln ?Generic drug: Aflibercept ?2 mg by Intravitreal route every 3 (three) months. Inject in R eye every 11 weeks per Retina specialist for Macular Degenration ?  ?furosemide 40 MG tablet ?Commonly known as: LASIX ?Take 40 mg by mouth daily. ?  ?ipratropium 0.06 % nasal spray ?Commonly known as: ATROVENT ?Place 2 sprays into both nostrils 2 (two) times daily. ?  ?Melatonin 10 MG Caps ?Take 10 mg by mouth at bedtime. ?  ?pantoprazole 40 MG  tablet ?Commonly known as: PROTONIX ?Take 1 tablet (40 mg total) by mouth daily. ?What changed: when to take this ?  ?polyethylene glycol 17 g packet ?Commonly known as: MIRALAX / GLYCOLAX ?Take 17 g by mouth daily. ?What changed:  ?when to take this ?reasons to take this ?  ?PreserVision AREDS 2 Caps ?Take 1 capsule by mouth daily. ?  ? ?  ? ? Follow-up Information   ? ? Allison Cruel, MD .   ?Specialty: Family Medicine ?Contact information: ?[address] ?[place] [address] ?[phone] ? ? ?  ?  ? ? Pixie Casino, MD .   ?Specialty: Cardiology ?Contact information: ?[address] ?[address] ?[place] [address] ?[phone] ? ? ?  ?  ? ? Care, [facility] Follow up.   ?Specialty: Home Health Services ?Why: [facility] will contact you to set up a visit. ?Contact information: ?[address] ?[address] ?[place] [address] ?[phone] ? ? ?  ?  ? ?  ?  ? ?  ? ?Allergies  ?Allergen Reactions  ? Codeine Itching  ? Penicillins Itching, Rash and Other (See Comments)  ?  Has patient had a PCN reaction causing immediate rash, facial/tongue/throat swelling, SOB or lightheadedness with hypotension: Yes ?Has patient had a PCN reaction causing severe rash involving mucus membranes or skin necrosis: No ?Has patient had a PCN reaction that required hospitalization: No ?Has patient had a PCN reaction occurring within the last 10 years: No ?If all of the above answers are "NO", then may proceed with Cephalosporin use.  ? Doxazosin   ?  Heavy feeling in chest, congestion, wheezing  ? Doxycycline Other (See Comments)  ?  Cause chest tightness  ? Hydralazine   ?  Felt bad and could hear pulse in head   ? ? ?Consultations: ?None ? ? ?Procedures/Studies: ?CT ABDOMEN PELVIS WO CONTRAST ? ?Result Date: 03/25/2021 ?CLINICAL DATA:  Abnormal chest CT.  Metastatic disease evaluation. EXAM: CT ABDOMEN AND PELVIS WITHOUT CONTRAST TECHNIQUE: Multidetector CT imaging of the abdomen and pelvis was  performed following the standard protocol without IV contrast. RADIATION DOSE REDUCTION: This exam was performed according to the departmental dose-optimization program which includes automated exposure control, adju

## 2021-04-01 DIAGNOSIS — E43 Unspecified severe protein-calorie malnutrition: Secondary | ICD-10-CM | POA: Diagnosis not present

## 2021-04-01 DIAGNOSIS — H3530 Unspecified macular degeneration: Secondary | ICD-10-CM | POA: Diagnosis not present

## 2021-04-01 DIAGNOSIS — Z950 Presence of cardiac pacemaker: Secondary | ICD-10-CM | POA: Diagnosis not present

## 2021-04-01 DIAGNOSIS — I083 Combined rheumatic disorders of mitral, aortic and tricuspid valves: Secondary | ICD-10-CM | POA: Diagnosis not present

## 2021-04-01 DIAGNOSIS — J9601 Acute respiratory failure with hypoxia: Secondary | ICD-10-CM | POA: Diagnosis not present

## 2021-04-01 DIAGNOSIS — N1832 Chronic kidney disease, stage 3b: Secondary | ICD-10-CM | POA: Diagnosis not present

## 2021-04-01 DIAGNOSIS — Z792 Long term (current) use of antibiotics: Secondary | ICD-10-CM | POA: Diagnosis not present

## 2021-04-01 DIAGNOSIS — I130 Hypertensive heart and chronic kidney disease with heart failure and stage 1 through stage 4 chronic kidney disease, or unspecified chronic kidney disease: Secondary | ICD-10-CM | POA: Diagnosis not present

## 2021-04-01 DIAGNOSIS — Z9012 Acquired absence of left breast and nipple: Secondary | ICD-10-CM | POA: Diagnosis not present

## 2021-04-01 DIAGNOSIS — M353 Polymyalgia rheumatica: Secondary | ICD-10-CM | POA: Diagnosis not present

## 2021-04-01 DIAGNOSIS — R911 Solitary pulmonary nodule: Secondary | ICD-10-CM | POA: Diagnosis not present

## 2021-04-01 DIAGNOSIS — I5032 Chronic diastolic (congestive) heart failure: Secondary | ICD-10-CM | POA: Diagnosis not present

## 2021-04-01 DIAGNOSIS — I272 Pulmonary hypertension, unspecified: Secondary | ICD-10-CM | POA: Diagnosis not present

## 2021-04-01 DIAGNOSIS — J189 Pneumonia, unspecified organism: Secondary | ICD-10-CM | POA: Diagnosis not present

## 2021-04-01 DIAGNOSIS — Z853 Personal history of malignant neoplasm of breast: Secondary | ICD-10-CM | POA: Diagnosis not present

## 2021-04-01 DIAGNOSIS — Z96659 Presence of unspecified artificial knee joint: Secondary | ICD-10-CM | POA: Diagnosis not present

## 2021-04-01 DIAGNOSIS — Z9981 Dependence on supplemental oxygen: Secondary | ICD-10-CM | POA: Diagnosis not present

## 2021-04-01 DIAGNOSIS — I700 Atherosclerosis of aorta: Secondary | ICD-10-CM | POA: Diagnosis not present

## 2021-04-01 DIAGNOSIS — Z7901 Long term (current) use of anticoagulants: Secondary | ICD-10-CM | POA: Diagnosis not present

## 2021-04-01 DIAGNOSIS — D631 Anemia in chronic kidney disease: Secondary | ICD-10-CM | POA: Diagnosis not present

## 2021-04-01 DIAGNOSIS — D509 Iron deficiency anemia, unspecified: Secondary | ICD-10-CM | POA: Diagnosis not present

## 2021-04-01 DIAGNOSIS — Z85828 Personal history of other malignant neoplasm of skin: Secondary | ICD-10-CM | POA: Diagnosis not present

## 2021-04-01 DIAGNOSIS — I480 Paroxysmal atrial fibrillation: Secondary | ICD-10-CM | POA: Diagnosis not present

## 2021-04-01 DIAGNOSIS — I495 Sick sinus syndrome: Secondary | ICD-10-CM | POA: Diagnosis not present

## 2021-04-01 DIAGNOSIS — I2699 Other pulmonary embolism without acute cor pulmonale: Secondary | ICD-10-CM | POA: Diagnosis not present

## 2021-04-02 DIAGNOSIS — J189 Pneumonia, unspecified organism: Secondary | ICD-10-CM | POA: Diagnosis not present

## 2021-04-02 DIAGNOSIS — J9601 Acute respiratory failure with hypoxia: Secondary | ICD-10-CM | POA: Diagnosis not present

## 2021-04-02 DIAGNOSIS — I2699 Other pulmonary embolism without acute cor pulmonale: Secondary | ICD-10-CM | POA: Diagnosis not present

## 2021-04-02 DIAGNOSIS — I130 Hypertensive heart and chronic kidney disease with heart failure and stage 1 through stage 4 chronic kidney disease, or unspecified chronic kidney disease: Secondary | ICD-10-CM | POA: Diagnosis not present

## 2021-04-02 DIAGNOSIS — I5032 Chronic diastolic (congestive) heart failure: Secondary | ICD-10-CM | POA: Diagnosis not present

## 2021-04-02 DIAGNOSIS — N1832 Chronic kidney disease, stage 3b: Secondary | ICD-10-CM | POA: Diagnosis not present

## 2021-04-05 DIAGNOSIS — J189 Pneumonia, unspecified organism: Secondary | ICD-10-CM | POA: Diagnosis not present

## 2021-04-05 DIAGNOSIS — N1832 Chronic kidney disease, stage 3b: Secondary | ICD-10-CM | POA: Diagnosis not present

## 2021-04-05 DIAGNOSIS — I130 Hypertensive heart and chronic kidney disease with heart failure and stage 1 through stage 4 chronic kidney disease, or unspecified chronic kidney disease: Secondary | ICD-10-CM | POA: Diagnosis not present

## 2021-04-05 DIAGNOSIS — I5032 Chronic diastolic (congestive) heart failure: Secondary | ICD-10-CM | POA: Diagnosis not present

## 2021-04-05 DIAGNOSIS — I2699 Other pulmonary embolism without acute cor pulmonale: Secondary | ICD-10-CM | POA: Diagnosis not present

## 2021-04-05 DIAGNOSIS — J9601 Acute respiratory failure with hypoxia: Secondary | ICD-10-CM | POA: Diagnosis not present

## 2021-04-08 DIAGNOSIS — I5032 Chronic diastolic (congestive) heart failure: Secondary | ICD-10-CM | POA: Diagnosis not present

## 2021-04-08 DIAGNOSIS — I2699 Other pulmonary embolism without acute cor pulmonale: Secondary | ICD-10-CM | POA: Diagnosis not present

## 2021-04-08 DIAGNOSIS — J9601 Acute respiratory failure with hypoxia: Secondary | ICD-10-CM | POA: Diagnosis not present

## 2021-04-08 DIAGNOSIS — I130 Hypertensive heart and chronic kidney disease with heart failure and stage 1 through stage 4 chronic kidney disease, or unspecified chronic kidney disease: Secondary | ICD-10-CM | POA: Diagnosis not present

## 2021-04-08 DIAGNOSIS — N1832 Chronic kidney disease, stage 3b: Secondary | ICD-10-CM | POA: Diagnosis not present

## 2021-04-08 DIAGNOSIS — J189 Pneumonia, unspecified organism: Secondary | ICD-10-CM | POA: Diagnosis not present

## 2021-04-10 DIAGNOSIS — M179 Osteoarthritis of knee, unspecified: Secondary | ICD-10-CM | POA: Diagnosis not present

## 2021-04-10 DIAGNOSIS — J189 Pneumonia, unspecified organism: Secondary | ICD-10-CM | POA: Diagnosis not present

## 2021-04-10 DIAGNOSIS — Z09 Encounter for follow-up examination after completed treatment for conditions other than malignant neoplasm: Secondary | ICD-10-CM | POA: Diagnosis not present

## 2021-04-10 DIAGNOSIS — I2699 Other pulmonary embolism without acute cor pulmonale: Secondary | ICD-10-CM | POA: Diagnosis not present

## 2021-04-10 DIAGNOSIS — R531 Weakness: Secondary | ICD-10-CM | POA: Diagnosis not present

## 2021-04-11 DIAGNOSIS — R338 Other retention of urine: Secondary | ICD-10-CM | POA: Diagnosis not present

## 2021-04-11 DIAGNOSIS — N319 Neuromuscular dysfunction of bladder, unspecified: Secondary | ICD-10-CM | POA: Diagnosis not present

## 2021-04-12 DIAGNOSIS — I5032 Chronic diastolic (congestive) heart failure: Secondary | ICD-10-CM | POA: Diagnosis not present

## 2021-04-12 DIAGNOSIS — J9601 Acute respiratory failure with hypoxia: Secondary | ICD-10-CM | POA: Diagnosis not present

## 2021-04-12 DIAGNOSIS — N1832 Chronic kidney disease, stage 3b: Secondary | ICD-10-CM | POA: Diagnosis not present

## 2021-04-12 DIAGNOSIS — I2699 Other pulmonary embolism without acute cor pulmonale: Secondary | ICD-10-CM | POA: Diagnosis not present

## 2021-04-12 DIAGNOSIS — J189 Pneumonia, unspecified organism: Secondary | ICD-10-CM | POA: Diagnosis not present

## 2021-04-12 DIAGNOSIS — I130 Hypertensive heart and chronic kidney disease with heart failure and stage 1 through stage 4 chronic kidney disease, or unspecified chronic kidney disease: Secondary | ICD-10-CM | POA: Diagnosis not present

## 2021-04-15 DIAGNOSIS — J189 Pneumonia, unspecified organism: Secondary | ICD-10-CM | POA: Diagnosis not present

## 2021-04-15 DIAGNOSIS — N1832 Chronic kidney disease, stage 3b: Secondary | ICD-10-CM | POA: Diagnosis not present

## 2021-04-15 DIAGNOSIS — I2699 Other pulmonary embolism without acute cor pulmonale: Secondary | ICD-10-CM | POA: Diagnosis not present

## 2021-04-15 DIAGNOSIS — J9601 Acute respiratory failure with hypoxia: Secondary | ICD-10-CM | POA: Diagnosis not present

## 2021-04-15 DIAGNOSIS — I130 Hypertensive heart and chronic kidney disease with heart failure and stage 1 through stage 4 chronic kidney disease, or unspecified chronic kidney disease: Secondary | ICD-10-CM | POA: Diagnosis not present

## 2021-04-15 DIAGNOSIS — I5032 Chronic diastolic (congestive) heart failure: Secondary | ICD-10-CM | POA: Diagnosis not present

## 2021-04-19 ENCOUNTER — Ambulatory Visit (INDEPENDENT_AMBULATORY_CARE_PROVIDER_SITE_OTHER): Payer: Medicare Other

## 2021-04-19 DIAGNOSIS — I495 Sick sinus syndrome: Secondary | ICD-10-CM | POA: Diagnosis not present

## 2021-04-19 LAB — CUP PACEART REMOTE DEVICE CHECK
Battery Remaining Longevity: 78 mo
Battery Remaining Percentage: 100 %
Brady Statistic RA Percent Paced: 54 %
Brady Statistic RV Percent Paced: 34 %
Date Time Interrogation Session: 20230414022200
Implantable Lead Implant Date: 20210716
Implantable Lead Implant Date: 20210716
Implantable Lead Location: 753859
Implantable Lead Location: 753860
Implantable Lead Model: 7840
Implantable Lead Model: 7841
Implantable Lead Serial Number: 1017229
Implantable Lead Serial Number: 1090224
Implantable Pulse Generator Implant Date: 20210716
Lead Channel Impedance Value: 561 Ohm
Lead Channel Impedance Value: 784 Ohm
Lead Channel Pacing Threshold Amplitude: 0.5 V
Lead Channel Pacing Threshold Amplitude: 1 V
Lead Channel Pacing Threshold Pulse Width: 0.4 ms
Lead Channel Pacing Threshold Pulse Width: 0.4 ms
Lead Channel Setting Pacing Amplitude: 2 V
Lead Channel Setting Pacing Amplitude: 2.5 V
Lead Channel Setting Pacing Pulse Width: 0.4 ms
Lead Channel Setting Sensing Sensitivity: 2.5 mV
Pulse Gen Serial Number: 547553

## 2021-04-20 DIAGNOSIS — I130 Hypertensive heart and chronic kidney disease with heart failure and stage 1 through stage 4 chronic kidney disease, or unspecified chronic kidney disease: Secondary | ICD-10-CM | POA: Diagnosis not present

## 2021-04-20 DIAGNOSIS — N1832 Chronic kidney disease, stage 3b: Secondary | ICD-10-CM | POA: Diagnosis not present

## 2021-04-20 DIAGNOSIS — J9601 Acute respiratory failure with hypoxia: Secondary | ICD-10-CM | POA: Diagnosis not present

## 2021-04-20 DIAGNOSIS — I2699 Other pulmonary embolism without acute cor pulmonale: Secondary | ICD-10-CM | POA: Diagnosis not present

## 2021-04-20 DIAGNOSIS — J189 Pneumonia, unspecified organism: Secondary | ICD-10-CM | POA: Diagnosis not present

## 2021-04-20 DIAGNOSIS — I5032 Chronic diastolic (congestive) heart failure: Secondary | ICD-10-CM | POA: Diagnosis not present

## 2021-04-22 ENCOUNTER — Encounter (INDEPENDENT_AMBULATORY_CARE_PROVIDER_SITE_OTHER): Payer: Medicare Other | Admitting: Ophthalmology

## 2021-04-22 DIAGNOSIS — I130 Hypertensive heart and chronic kidney disease with heart failure and stage 1 through stage 4 chronic kidney disease, or unspecified chronic kidney disease: Secondary | ICD-10-CM | POA: Diagnosis not present

## 2021-04-22 DIAGNOSIS — I5032 Chronic diastolic (congestive) heart failure: Secondary | ICD-10-CM | POA: Diagnosis not present

## 2021-04-22 DIAGNOSIS — J189 Pneumonia, unspecified organism: Secondary | ICD-10-CM | POA: Diagnosis not present

## 2021-04-22 DIAGNOSIS — N1832 Chronic kidney disease, stage 3b: Secondary | ICD-10-CM | POA: Diagnosis not present

## 2021-04-22 DIAGNOSIS — I2699 Other pulmonary embolism without acute cor pulmonale: Secondary | ICD-10-CM | POA: Diagnosis not present

## 2021-04-22 DIAGNOSIS — J9601 Acute respiratory failure with hypoxia: Secondary | ICD-10-CM | POA: Diagnosis not present

## 2021-04-23 ENCOUNTER — Ambulatory Visit (INDEPENDENT_AMBULATORY_CARE_PROVIDER_SITE_OTHER): Payer: Medicare Other | Admitting: Ophthalmology

## 2021-04-23 ENCOUNTER — Encounter (INDEPENDENT_AMBULATORY_CARE_PROVIDER_SITE_OTHER): Payer: Self-pay | Admitting: Ophthalmology

## 2021-04-23 DIAGNOSIS — H353211 Exudative age-related macular degeneration, right eye, with active choroidal neovascularization: Secondary | ICD-10-CM

## 2021-04-23 DIAGNOSIS — H353124 Nonexudative age-related macular degeneration, left eye, advanced atrophic with subfoveal involvement: Secondary | ICD-10-CM

## 2021-04-23 DIAGNOSIS — H43821 Vitreomacular adhesion, right eye: Secondary | ICD-10-CM | POA: Diagnosis not present

## 2021-04-23 MED ORDER — AFLIBERCEPT 2MG/0.05ML IZ SOLN FOR KALEIDOSCOPE
2.0000 mg | INTRAVITREAL | Status: AC | PRN
  Administered 2021-04-23: 2 mg via INTRAVITREAL

## 2021-04-23 NOTE — Progress Notes (Signed)
? ? ?04/23/2021 ? ?  ? ?CHIEF COMPLAINT ?Patient presents for  ?Chief Complaint  ?Patient presents with  ? Macular Degeneration  ? ? ? ? ?HISTORY OF PRESENT ILLNESS: ?Allison Norman is a 88 y.o. female who presents to the clinic today for:  ? ?HPI   ?12 weeks for DILATE, OD, EYLEA OCT. ?Pt stated, "I'm not seeing as good as I used to. I cannot read small print, I have to use a magnifying glass." ?Pt stated vision is gradually getting worse. ?Pt denies floaters and FOL. ? ?Last edited by Silvestre Moment on 04/23/2021  1:19 PM.  ?  ? ? ?Referring physician: ?[name], MD ?[address] ?[place],  [place] [address] ? ?HISTORICAL INFORMATION:  ? ?Selected notes from the [facility] ?  ?   ? ?CURRENT MEDICATIONS: ?Current Outpatient Medications (Ophthalmic Drugs)  ?Medication Sig  ? Aflibercept (EYLEA) 2 MG/0.05ML SOLN 2 mg by Intravitreal route every 3 (three) months. Inject in R eye every 11 weeks per Retina specialist for Macular Degenration  ? ?No current facility-administered medications for this visit. (Ophthalmic Drugs)  ? ?Current Outpatient Medications (Other)  ?Medication Sig  ? acetaminophen (TYLENOL) 500 MG tablet Take 1,000 mg by mouth every 6 (six) hours as needed for moderate pain.  ? amiodarone (PACERONE) 200 MG tablet Take 1/2 (one-half) tablet by mouth once daily (Patient taking differently: Take 100 mg by mouth daily.)  ? apixaban (ELIQUIS) 5 MG TABS tablet Take 2 tablets (10 mg total) by mouth 2 (two) times daily for 5 days.  ? apixaban (ELIQUIS) 5 MG TABS tablet Take 1 tablet (5 mg total) by mouth 2 (two) times daily.  ? azithromycin (ZITHROMAX) 250 MG tablet Take a tab daily  ? carvedilol (COREG) 12.5 MG tablet Take 1 tablet (12.5 mg total) by mouth 2 (two) times daily with a meal.  ? cloNIDine (CATAPRES) 0.2 MG tablet Take 1 tablet (0.2 mg total) by mouth 3 (three) times daily. (Patient taking differently: Take 0.2-0.3 mg by mouth 3 (three) times daily. Takes 0.2 mg in the morning 0.3 mg in  the afternoon and night.)  ? dextromethorphan (DELSYM) 30 MG/5ML liquid Take 30 mg by mouth daily as needed for cough.  ? diazepam (VALIUM) 2 MG tablet Take 2 mg by mouth at bedtime as needed for anxiety.  ? digoxin (LANOXIN) 0.125 MG tablet Take 1 tablet (0.125 mg total) by mouth every other day.  ? furosemide (LASIX) 40 MG tablet Take 40 mg by mouth daily.  ? ipratropium (ATROVENT) 0.06 % nasal spray Place 2 sprays into both nostrils 2 (two) times daily.  ? Melatonin 10 MG CAPS Take 10 mg by mouth at bedtime.  ? Multiple Vitamins-Minerals (PRESERVISION AREDS 2) CAPS Take 1 capsule by mouth daily.  ? pantoprazole (PROTONIX) 40 MG tablet Take 1 tablet (40 mg total) by mouth daily. (Patient taking differently: Take 40 mg by mouth every evening.)  ? polyethylene glycol (MIRALAX / GLYCOLAX) 17 g packet Take 17 g by mouth daily. (Patient taking differently: Take 17 g by mouth daily as needed for moderate constipation.)  ? ?No current facility-administered medications for this visit. (Other)  ? ? ? ? ?REVIEW OF SYSTEMS: ?ROS   ?Negative for: Constitutional, Gastrointestinal, Neurological, Skin, Genitourinary, Musculoskeletal, HENT, Endocrine, Cardiovascular, Eyes, Respiratory, Psychiatric, Allergic/Imm, Heme/Lymph ?Last edited by Silvestre Moment on 04/23/2021  1:19 PM.  ?  ? ? ? ?ALLERGIES ?Allergies  ?Allergen Reactions  ? Codeine Itching  ? Penicillins Itching,  Rash and Other (See Comments)  ?  Has patient had a PCN reaction causing immediate rash, facial/tongue/throat swelling, SOB or lightheadedness with hypotension: Yes ?Has patient had a PCN reaction causing severe rash involving mucus membranes or skin necrosis: No ?Has patient had a PCN reaction that required hospitalization: No ?Has patient had a PCN reaction occurring within the last 10 years: No ?If all of the above answers are "NO", then may proceed with Cephalosporin use.  ? Doxazosin   ?  Heavy feeling in chest, congestion, wheezing  ? Doxycycline Other (See  Comments)  ?  Cause chest tightness  ? Hydralazine   ?  Felt bad and could hear pulse in head   ? ? ?PAST MEDICAL HISTORY ?Past Medical History:  ?Diagnosis Date  ? Atrial fibrillation ([place]) 2017  ? a. s/p DCCV in 10/2015  b. recurrent in 08/2016 --> rate-control pursued.   ? Cancer [facility])   ? Breast  ? CKD (chronic kidney disease)   ? GCA (giant cell arteritis) ([place])   ? Hordeolum internum left lower eyelid 06/28/2019  ? Hypertension   ? Macular degeneration   ? Osteoporosis   ? PMR (polymyalgia rheumatica) ([facility])   ? Pulmonary hypertension, unspecified ([place]) 02/14/2021  ? Resistant hypertension 03/15/2019  ? Shortness of breath 03/15/2019  ? Skin cancer   ? ?Past Surgical History:  ?Procedure Laterality Date  ? CARDIOVERSION N/A 10/18/2015  ? Procedure: CARDIOVERSION;  Surgeon: Jerline Pain, MD;  Location: [facility];  Service: Cardiovascular;  Laterality: N/A;  ? CARDIOVERSION N/A 02/20/2017  ? Procedure: CARDIOVERSION;  Surgeon: Pixie Casino, MD;  Location: [facility];  Service: Cardiovascular;  Laterality: N/A;  ? CARDIOVERSION N/A 06/18/2017  ? Procedure: CARDIOVERSION;  Surgeon: Sanda Klein, MD;  Location: [facility];  Service: Cardiovascular;  Laterality: N/A;  ? CARDIOVERSION N/A 12/21/2018  ? Procedure: CARDIOVERSION;  Surgeon: Thayer Headings, MD;  Location: [facility];  Service: Cardiovascular;  Laterality: N/A;  ? IR CATHETER TUBE CHANGE  02/28/2021  ? KNEE SURGERY  2013  ? MASTECTOMY  1998  ? left side  ? PACEMAKER IMPLANT N/A 07/22/2019  ? Procedure: PACEMAKER IMPLANT;  Surgeon: Evans Lance, MD;  Location: [facility];  Service: Cardiovascular;  Laterality: N/A;  ? ? ?FAMILY HISTORY ?Family History  ?Problem Relation Age of Onset  ? Stroke Mother   ? Kidney failure Father   ?     died at 20  ? Heart disease Sister   ?     died at 24  ? Heart disease Sister   ?     died at 42  ? Breast cancer Daughter   ? ? ?SOCIAL HISTORY ?Social History  ? ?Tobacco Use  ? Smoking status: Former  ?   Types: Cigarettes  ?  Quit date: 08/31/1966  ?  Years since quitting: 54.6  ? Smokeless tobacco: Never  ?Vaping Use  ? Vaping Use: Never used  ?Substance Use Topics  ? Alcohol use: No  ? Drug use: No  ? ?  ? ?  ? ?OPHTHALMIC EXAM: ? ?Base Eye Exam   ? ? Visual Acuity (ETDRS)   ? ?   Right Left  ? Dist cc 20/50 CF at 2'  ? Dist ph cc 20/40   ? ? Correction: Glasses  ? ?  ?  ? ? Tonometry (Tonopen, 1:24 PM)   ? ?   Right Left  ? Pressure 11 9  ? ?  ?  ? ?  Pupils   ? ?   Pupils APD  ? Right PERRL None  ? Left PERRL None  ? ?  ?  ? ? Visual Fields   ? ?   Left Right  ?  Full Full  ? ?  ?  ? ? Extraocular Movement   ? ?   Right Left  ?  Full Full  ? ?  ?  ? ? Neuro/Psych   ? ? Oriented x3: Yes  ? Mood/Affect: Normal  ? ?  ?  ? ? Dilation   ? ? Right eye: 2.5% Phenylephrine, 1.0% Mydriacyl @ 1:24 PM  ? ?  ?  ? ?  ? ?Slit Lamp and Fundus Exam   ? ? External Exam   ? ?   Right Left  ? External Normal Normal  ? ?  ?  ? ? Slit Lamp Exam   ? ?   Right Left  ? Lids/Lashes Normal Normal  ? Conjunctiva/Sclera White and quiet White and quiet  ? Cornea Clear Clear  ? Anterior Chamber Deep and quiet Deep and quiet  ? Iris Round and reactive Round and reactive  ? Lens Posterior chamber intraocular lens, Open posterior capsule Posterior chamber intraocular lens, Open posterior capsule  ? Anterior Vitreous Normal Normal  ? ?  ?  ? ? Fundus Exam   ? ?   Right Left  ? Posterior Vitreous Normal   ? Disc Normal   ? C/D Ratio 0.45   ? Macula Geographic atrophy approaching nasal FAZ, Advanced age related macular degeneration, Disciform scar nasal to faz, , Drus[place], Retinal pigment epithelial mottling   ? Vessels Normal   ? Periphery Normal   ? ?  ?  ? ?  ? ? ?IMAGING AND PROCEDURES  ?Imaging and Procedures for 04/23/21 ? ?OCT, Retina - OU - Both Eyes   ? ?   ?Right Eye ?Quality was good. Scan locations included subfoveal. Central Foveal Thickness: 297. Progression has improved. Findings include intraretinal fluid, vitreomacular adhesion ,  pigment epithelial detachment, subretinal scarring, disciform scar.  ? ?Left Eye ?Quality was good. Scan locations included subfoveal. Central Foveal Thickness: 295. Progression has been stable. Findings include

## 2021-04-23 NOTE — Assessment & Plan Note (Signed)
Today at 12-week follow-up, condition stable not worse but still with subretinal fluid nasal to FAZ ?

## 2021-04-23 NOTE — Assessment & Plan Note (Signed)
Physiologic OD but will continue to monitor the nasal aspect of the macula ?

## 2021-04-23 NOTE — Assessment & Plan Note (Signed)
Atrophy accounts for acuity 

## 2021-04-25 ENCOUNTER — Telehealth (HOSPITAL_BASED_OUTPATIENT_CLINIC_OR_DEPARTMENT_OTHER): Payer: Self-pay | Admitting: Cardiovascular Disease

## 2021-04-25 NOTE — Telephone Encounter (Signed)
Received fax, was letter from [facility] regarding Eliquis ?Called [phone] and spoke with Reynaldo Minium  ?There is a limit of Eliquis 5 mg #74 in 30 days and patient received more than that  ?Patient was d/c from hospital on Eliquis 5 mg 2 tablets twice a day for 5 days only and then 5 mg twice a day ?Per Reynaldo Minium Eliquis is a covered drug and should be covered as long staying under the #74  ?Advised granddaughter  ?

## 2021-04-25 NOTE — Telephone Encounter (Signed)
Will watch for fax to come in 4/20 ?

## 2021-04-25 NOTE — Telephone Encounter (Signed)
Granddaughter called in to say that she is fax a form over to say that insurance will only cover for 90 days for her apixaban (ELIQUIS) 5 MG TABS tablet. Please advise ?

## 2021-04-27 DIAGNOSIS — I130 Hypertensive heart and chronic kidney disease with heart failure and stage 1 through stage 4 chronic kidney disease, or unspecified chronic kidney disease: Secondary | ICD-10-CM | POA: Diagnosis not present

## 2021-04-27 DIAGNOSIS — I5032 Chronic diastolic (congestive) heart failure: Secondary | ICD-10-CM | POA: Diagnosis not present

## 2021-04-27 DIAGNOSIS — J189 Pneumonia, unspecified organism: Secondary | ICD-10-CM | POA: Diagnosis not present

## 2021-04-27 DIAGNOSIS — I2699 Other pulmonary embolism without acute cor pulmonale: Secondary | ICD-10-CM | POA: Diagnosis not present

## 2021-04-27 DIAGNOSIS — J9601 Acute respiratory failure with hypoxia: Secondary | ICD-10-CM | POA: Diagnosis not present

## 2021-04-27 DIAGNOSIS — N1832 Chronic kidney disease, stage 3b: Secondary | ICD-10-CM | POA: Diagnosis not present

## 2021-04-29 DIAGNOSIS — C44329 Squamous cell carcinoma of skin of other parts of face: Secondary | ICD-10-CM | POA: Diagnosis not present

## 2021-04-29 DIAGNOSIS — Z85828 Personal history of other malignant neoplasm of skin: Secondary | ICD-10-CM | POA: Diagnosis not present

## 2021-05-01 DIAGNOSIS — I083 Combined rheumatic disorders of mitral, aortic and tricuspid valves: Secondary | ICD-10-CM | POA: Diagnosis not present

## 2021-05-01 DIAGNOSIS — D631 Anemia in chronic kidney disease: Secondary | ICD-10-CM | POA: Diagnosis not present

## 2021-05-01 DIAGNOSIS — Z9981 Dependence on supplemental oxygen: Secondary | ICD-10-CM | POA: Diagnosis not present

## 2021-05-01 DIAGNOSIS — I5032 Chronic diastolic (congestive) heart failure: Secondary | ICD-10-CM | POA: Diagnosis not present

## 2021-05-01 DIAGNOSIS — Z96659 Presence of unspecified artificial knee joint: Secondary | ICD-10-CM | POA: Diagnosis not present

## 2021-05-01 DIAGNOSIS — E43 Unspecified severe protein-calorie malnutrition: Secondary | ICD-10-CM | POA: Diagnosis not present

## 2021-05-01 DIAGNOSIS — Z85828 Personal history of other malignant neoplasm of skin: Secondary | ICD-10-CM | POA: Diagnosis not present

## 2021-05-01 DIAGNOSIS — Z853 Personal history of malignant neoplasm of breast: Secondary | ICD-10-CM | POA: Diagnosis not present

## 2021-05-01 DIAGNOSIS — I130 Hypertensive heart and chronic kidney disease with heart failure and stage 1 through stage 4 chronic kidney disease, or unspecified chronic kidney disease: Secondary | ICD-10-CM | POA: Diagnosis not present

## 2021-05-01 DIAGNOSIS — Z9012 Acquired absence of left breast and nipple: Secondary | ICD-10-CM | POA: Diagnosis not present

## 2021-05-01 DIAGNOSIS — Z950 Presence of cardiac pacemaker: Secondary | ICD-10-CM | POA: Diagnosis not present

## 2021-05-01 DIAGNOSIS — I272 Pulmonary hypertension, unspecified: Secondary | ICD-10-CM | POA: Diagnosis not present

## 2021-05-01 DIAGNOSIS — H3530 Unspecified macular degeneration: Secondary | ICD-10-CM | POA: Diagnosis not present

## 2021-05-01 DIAGNOSIS — Z792 Long term (current) use of antibiotics: Secondary | ICD-10-CM | POA: Diagnosis not present

## 2021-05-01 DIAGNOSIS — I2699 Other pulmonary embolism without acute cor pulmonale: Secondary | ICD-10-CM | POA: Diagnosis not present

## 2021-05-01 DIAGNOSIS — D509 Iron deficiency anemia, unspecified: Secondary | ICD-10-CM | POA: Diagnosis not present

## 2021-05-01 DIAGNOSIS — M353 Polymyalgia rheumatica: Secondary | ICD-10-CM | POA: Diagnosis not present

## 2021-05-01 DIAGNOSIS — I495 Sick sinus syndrome: Secondary | ICD-10-CM | POA: Diagnosis not present

## 2021-05-01 DIAGNOSIS — N1832 Chronic kidney disease, stage 3b: Secondary | ICD-10-CM | POA: Diagnosis not present

## 2021-05-01 DIAGNOSIS — Z7901 Long term (current) use of anticoagulants: Secondary | ICD-10-CM | POA: Diagnosis not present

## 2021-05-01 DIAGNOSIS — I480 Paroxysmal atrial fibrillation: Secondary | ICD-10-CM | POA: Diagnosis not present

## 2021-05-01 DIAGNOSIS — I700 Atherosclerosis of aorta: Secondary | ICD-10-CM | POA: Diagnosis not present

## 2021-05-01 DIAGNOSIS — R911 Solitary pulmonary nodule: Secondary | ICD-10-CM | POA: Diagnosis not present

## 2021-05-01 DIAGNOSIS — J9601 Acute respiratory failure with hypoxia: Secondary | ICD-10-CM | POA: Diagnosis not present

## 2021-05-01 DIAGNOSIS — J189 Pneumonia, unspecified organism: Secondary | ICD-10-CM | POA: Diagnosis not present

## 2021-05-03 DIAGNOSIS — I2699 Other pulmonary embolism without acute cor pulmonale: Secondary | ICD-10-CM | POA: Diagnosis not present

## 2021-05-03 DIAGNOSIS — J9601 Acute respiratory failure with hypoxia: Secondary | ICD-10-CM | POA: Diagnosis not present

## 2021-05-03 DIAGNOSIS — I130 Hypertensive heart and chronic kidney disease with heart failure and stage 1 through stage 4 chronic kidney disease, or unspecified chronic kidney disease: Secondary | ICD-10-CM | POA: Diagnosis not present

## 2021-05-03 DIAGNOSIS — N1832 Chronic kidney disease, stage 3b: Secondary | ICD-10-CM | POA: Diagnosis not present

## 2021-05-03 DIAGNOSIS — J189 Pneumonia, unspecified organism: Secondary | ICD-10-CM | POA: Diagnosis not present

## 2021-05-03 DIAGNOSIS — I5032 Chronic diastolic (congestive) heart failure: Secondary | ICD-10-CM | POA: Diagnosis not present

## 2021-05-07 NOTE — Progress Notes (Signed)
Remote pacemaker transmission.   

## 2021-05-09 DIAGNOSIS — R338 Other retention of urine: Secondary | ICD-10-CM | POA: Diagnosis not present

## 2021-05-09 DIAGNOSIS — N319 Neuromuscular dysfunction of bladder, unspecified: Secondary | ICD-10-CM | POA: Diagnosis not present

## 2021-05-10 DIAGNOSIS — R059 Cough, unspecified: Secondary | ICD-10-CM | POA: Diagnosis not present

## 2021-05-10 DIAGNOSIS — I2699 Other pulmonary embolism without acute cor pulmonale: Secondary | ICD-10-CM | POA: Diagnosis not present

## 2021-05-20 ENCOUNTER — Telehealth: Payer: Self-pay | Admitting: Internal Medicine

## 2021-05-20 NOTE — Telephone Encounter (Signed)
Also route to PharmD pool ?

## 2021-05-20 NOTE — Telephone Encounter (Signed)
RN returned call and advised patient of MD recommendations.  ? ? ? ?"If the skin cancer is frozen off or removed with cautery, it would not be necessary to stop the Eliquis. I agree with the recommendations of her dermatologist. ?  ?Dr. Debara Pickett"  ?

## 2021-05-20 NOTE — Telephone Encounter (Signed)
RN returned call to patient. Patient calling in because she is having an appointment tomorrow 5/16, to have skin cancer removed. Patient was told by performing office that she did not need to hold her Eliquis. Patient states she is on '5mg'$  twice daily.  ? ?Patient would like to know if Dr. Debara Pickett agrees with this. Routing to Dr. Debara Pickett and Sheral Apley for advisement.  ?

## 2021-05-20 NOTE — Telephone Encounter (Signed)
If the skin cancer is frozen off or removed with cautery, it would not be necessary to stop the Eliquis. I agree with the recommendations of her dermatologist. ? ?Dr. Debara Pickett ?

## 2021-05-20 NOTE — Telephone Encounter (Signed)
Pt c/o medication issue: ? ?1. Name of Medication:  ? apixaban (ELIQUIS) 5 MG TABS tablet  ? ? ?2. How are you currently taking this medication (dosage and times per day)?  ?Take 1 tablet (5 mg total) by mouth 2 (two) times daily. ?3. Are you having a reaction (difficulty breathing--STAT)? No ? ?4. What is your medication issue? Pt states that she is having Skin Cancer Surgery and would like to speak with someone regarding mediation. Please advise ? ?

## 2021-05-21 DIAGNOSIS — C44329 Squamous cell carcinoma of skin of other parts of face: Secondary | ICD-10-CM | POA: Diagnosis not present

## 2021-05-21 DIAGNOSIS — Z85828 Personal history of other malignant neoplasm of skin: Secondary | ICD-10-CM | POA: Diagnosis not present

## 2021-05-28 DIAGNOSIS — L570 Actinic keratosis: Secondary | ICD-10-CM | POA: Diagnosis not present

## 2021-06-10 DIAGNOSIS — N319 Neuromuscular dysfunction of bladder, unspecified: Secondary | ICD-10-CM | POA: Diagnosis not present

## 2021-06-11 NOTE — Patient Outreach (Signed)
[facility]) Care Management  06/11/2021  Allison Norman 1924/08/28 [id number]   Received referral for Care Management from Insurance plan. Assigned patient to Raina Mina, RN Care Coordinator for follow up.   [place] Management Assistant [phone]

## 2021-06-13 DIAGNOSIS — M353 Polymyalgia rheumatica: Secondary | ICD-10-CM | POA: Diagnosis not present

## 2021-06-13 DIAGNOSIS — M1711 Unilateral primary osteoarthritis, right knee: Secondary | ICD-10-CM | POA: Diagnosis not present

## 2021-06-13 DIAGNOSIS — F411 Generalized anxiety disorder: Secondary | ICD-10-CM | POA: Diagnosis not present

## 2021-06-13 DIAGNOSIS — J309 Allergic rhinitis, unspecified: Secondary | ICD-10-CM | POA: Diagnosis not present

## 2021-06-13 DIAGNOSIS — I10 Essential (primary) hypertension: Secondary | ICD-10-CM | POA: Diagnosis not present

## 2021-06-20 ENCOUNTER — Ambulatory Visit (INDEPENDENT_AMBULATORY_CARE_PROVIDER_SITE_OTHER): Payer: Medicare Other | Admitting: Family

## 2021-06-20 ENCOUNTER — Encounter (HOSPITAL_BASED_OUTPATIENT_CLINIC_OR_DEPARTMENT_OTHER): Payer: Self-pay | Admitting: Family

## 2021-06-20 ENCOUNTER — Telehealth: Payer: Self-pay | Admitting: Internal Medicine

## 2021-06-20 VITALS — BP 140/70 | HR 60 | Ht 65.5 in | Wt 123.0 lb

## 2021-06-20 DIAGNOSIS — D6859 Other primary thrombophilia: Secondary | ICD-10-CM

## 2021-06-20 DIAGNOSIS — I10 Essential (primary) hypertension: Secondary | ICD-10-CM

## 2021-06-20 DIAGNOSIS — I272 Pulmonary hypertension, unspecified: Secondary | ICD-10-CM | POA: Diagnosis not present

## 2021-06-20 DIAGNOSIS — Z79899 Other long term (current) drug therapy: Secondary | ICD-10-CM

## 2021-06-20 DIAGNOSIS — I071 Rheumatic tricuspid insufficiency: Secondary | ICD-10-CM | POA: Diagnosis not present

## 2021-06-20 DIAGNOSIS — I4819 Other persistent atrial fibrillation: Secondary | ICD-10-CM

## 2021-06-20 DIAGNOSIS — I350 Nonrheumatic aortic (valve) stenosis: Secondary | ICD-10-CM | POA: Diagnosis not present

## 2021-06-20 DIAGNOSIS — Z950 Presence of cardiac pacemaker: Secondary | ICD-10-CM

## 2021-06-20 MED ORDER — CARVEDILOL 12.5 MG PO TABS: 12.5000 mg | ORAL_TABLET | Freq: Two times a day (BID) | ORAL | 3 refills | Status: DC

## 2021-06-20 MED ORDER — CLONIDINE HCL 0.2 MG PO TABS: ORAL_TABLET | ORAL | 1 refills | Status: DC

## 2021-06-20 MED ORDER — CLONIDINE HCL 0.3 MG PO TABS: ORAL_TABLET | ORAL | 1 refills | Status: DC

## 2021-06-20 NOTE — Progress Notes (Unsigned)
Office Visit    Patient Name: Allison Norman Date of Encounter: 06/22/2021  PCP:  [name], [place]  Cardiologist:  Skeet Latch, MD  Advanced Practice Provider:  No care team member to display Electrophysiologist:  Cristopher Peru, MD      Chief Complaint    Allison Norman is a 88 y.o. female presents today for elevated blood pressure   Past Medical History    Past Medical History:  Diagnosis Date   Atrial fibrillation ([place]) 2017   a. s/p DCCV in 10/2015  b. recurrent in 08/2016 --> rate-control pursued.    Cancer ([place])    Breast   CKD (chronic kidney disease)    GCA (giant cell arteritis) (HCC)    Hordeolum internum left lower eyelid 06/28/2019   Hypertension    Macular degeneration    Osteoporosis    PMR (polymyalgia rheumatica) (HCC)    Pulmonary hypertension, unspecified ([place]) 02/14/2021   Resistant hypertension 03/15/2019   Shortness of breath 03/15/2019   Skin cancer    Past Surgical History:  Procedure Laterality Date   CARDIOVERSION N/A 10/18/2015   Procedure: CARDIOVERSION;  Surgeon: Jerline Pain, MD;  Location: [place];  Service: Cardiovascular;  Laterality: N/A;   CARDIOVERSION N/A 02/20/2017   Procedure: CARDIOVERSION;  Surgeon: Pixie Casino, MD;  Location: [place];  Service: Cardiovascular;  Laterality: N/A;   CARDIOVERSION N/A 06/18/2017   Procedure: CARDIOVERSION;  Surgeon: Sanda Klein, MD;  Location: [place] ENDOSCOPY;  Service: Cardiovascular;  Laterality: N/A;   CARDIOVERSION N/A 12/21/2018   Procedure: CARDIOVERSION;  Surgeon: Thayer Headings, MD;  Location: [facility] ENDOSCOPY;  Service: Cardiovascular;  Laterality: N/A;   IR CATHETER TUBE CHANGE  02/28/2021   KNEE SURGERY  2013   MASTECTOMY  1998   left side   PACEMAKER IMPLANT N/A 07/22/2019   Procedure: PACEMAKER IMPLANT;  Surgeon: Evans Lance, MD;  Location: [place] CV LAB;  Service: Cardiovascular;  Laterality: N/A;     Allergies  Allergies  Allergen Reactions   Codeine Itching   Penicillins Itching, Rash and Other (See Comments)    Has patient had a PCN reaction causing immediate rash, facial/tongue/throat swelling, SOB or lightheadedness with hypotension: Yes Has patient had a PCN reaction causing severe rash involving mucus membranes or skin necrosis: No Has patient had a PCN reaction that required hospitalization: No Has patient had a PCN reaction occurring within the last 10 years: No If all of the above answers are "NO", then may proceed with Cephalosporin use.   Doxazosin     Heavy feeling in chest, congestion, wheezing   Doxycycline Other (See Comments)    Cause chest tightness   Hydralazine     Felt bad and could hear pulse in head     History of Present Illness    Allison Norman is a 88 y.o. female with a hx of persistent atrial fibrillation, HTN, CKD IV, PPM, pulmonary hypertension, diastolic dysfunction, mild aortic stenosis, moderate to severe TR last seen 02/14/2021 by Dr. Oval Linsey.  Initially established with advanced hypertension clinic 02/2019.  She had been diagnosed with hypertension several years prior.  Prior to developing atrial fibrillation BP was better controlled.  Medications were adjusted for improved rate control.  Medic occasion intolerance as detailed below.  Carotid Doppler 01/2020 bilateral 1 to 39% stenosis.  Echo 01/2020 LVEF 65 to 70%, severe LVH, grade 3 diastolic dysfunction, severe biatrial enlargement, moderate to severe TR,  mild aortic stenosis PASP 65 mmHg.  She at that time declined further evaluation of severe pulmonary hypertension and valvular heart disease.  Last seen 02/14/2021 Dr. Oval Linsey.  She noted some dry mouth with clonidine but was willing to continue.  Blood pressures reported elevated at home but well controlled in clinic.  She was recommended to monitor at home and contact office with results.  Her current doses of carvedilol, clonidine, Lasix were  continued.  Admitted 3/19-3/23/23 after presenting with hypoxia found to have pulmonary emboli.  Echo with LVEF 60%.  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  Recommended for outpatient MRI.  She did have community-acquired pneumonia noted by CT and treated with Rocephin and azithromycin with discharged on Omnicef and azithromycin.  She presents today for follow up with her caregiver for follow up after an elevated blood pressure this morning 2 8/105 prior to taking medications.  The night prior 121/92.  Of note blood pressure 06/10/2021 133/68.  Reports no recent hypotensive blood pressure readings at home.  She notes that her blood pressure seems to be elevated in the evenings.  She has been self increasing her clonidine to 0.3 mg in the afternoon and sometimes in the evening.  Her current medication schedule listed below. Reports no shortness of breath nor dyspnea on exertion. Reports no chest pain, pressure, or tightness. No edema, orthopnea, PND. Reports no palpitations.  Mild cough from prior cold but this is overall improving and she has been evaluated by her primary care provider.  Current medication schedule 9AM carvedilol, clonidine, furosemide 2PM clonidine 6PM carvedilol 10PM clonidine  Previous antihypertensives: Amlodipine 10 mg (edema, tolerates 5 mg dose) Doxazosin (shortness of breath) Hydralazine (intolerance not specified) High-dose carvedilol (pulsing sensation in ears) Diltiazem (lower extremity edema)   EKGs/Labs/Other Studies Reviewed:   The following studies were reviewed today:  EKG:  No EKG today.  Recent Labs: 03/24/2021: B Natriuretic Peptide 484.3 03/25/2021: ALT 12; BUN 30; Creatinine, Ser 1.66; Magnesium 2.4; Potassium 3.8; Sodium 134; TSH 2.296 03/28/2021: Hemoglobin 10.5; Platelets 215  Recent Lipid Panel    Component Value Date/Time   CHOL  07/04/2009 0418    81        ATP III CLASSIFICATION:  <200      mg/dL   Desirable  200-239  mg/dL   Borderline High  >=240    mg/dL   High          TRIG 64 07/04/2009 0418   HDL 18 (L) 07/04/2009 0418   CHOLHDL 4.5 07/04/2009 0418   VLDL 13 07/04/2009 0418   LDLCALC  07/04/2009 0418    50        Total Cholesterol/HDL:CHD Risk Coronary Heart Disease Risk Table                     Men   Women  1/2 Average Risk   3.4   3.3  Average Risk       5.0   4.4  2 X Average Risk   9.6   7.1  3 X Average Risk  23.4   11.0        Use the calculated Patient Ratio above and the CHD Risk Table to determine the patient's CHD Risk.        ATP III CLASSIFICATION (LDL):  <100     mg/dL   Optimal  100-129  mg/dL   Near or Above  Optimal  130-159  mg/dL   Borderline  160-189  mg/dL   High  >190     mg/dL   Very High    Risk Assessment/Calculations:   CHA2DS2-VASc Score = 5   This indicates a 7.2% annual risk of stroke. The patient's score is based upon: CHF History: 1 HTN History: 1 Diabetes History: 0 Stroke History: 0 Vascular Disease History: 0 Age Score: 2 Gender Score: 1   Home Medications   Current Meds  Medication Sig   acetaminophen (TYLENOL) 500 MG tablet Take 1,000 mg by mouth every 6 (six) hours as needed for moderate pain.   Aflibercept (EYLEA) 2 MG/0.05ML SOLN 2 mg by Intravitreal route every 3 (three) months. Inject in R eye every 11 weeks per Retina specialist for Macular Degenration   amiodarone (PACERONE) 200 MG tablet Take 1/2 (one-half) tablet by mouth once daily (Patient taking differently: Take 100 mg by mouth daily.)   apixaban (ELIQUIS) 5 MG TABS tablet Take 1 tablet (5 mg total) by mouth 2 (two) times daily.   cloNIDine (CATAPRES) 0.3 MG tablet Take 0.'2mg'$  in the morning, 0.'3mg'$  in the afternoon at 2pm, and 0.'2mg'$  at bedtime.   dextromethorphan (DELSYM) 30 MG/5ML liquid Take 30 mg by mouth daily as needed for cough.   diazepam (VALIUM) 2 MG tablet Take 2 mg by mouth at bedtime as needed for anxiety.    digoxin (LANOXIN) 0.125 MG tablet Take 1 tablet (0.125 mg total) by mouth every other day.   furosemide (LASIX) 40 MG tablet Take 40 mg by mouth daily.   ipratropium (ATROVENT) 0.06 % nasal spray Place 2 sprays into both nostrils 2 (two) times daily.   Melatonin 10 MG CAPS Take 10 mg by mouth at bedtime.   Multiple Vitamins-Minerals (PRESERVISION AREDS 2) CAPS Take 1 capsule by mouth daily.   pantoprazole (PROTONIX) 40 MG tablet Take 1 tablet (40 mg total) by mouth daily. (Patient taking differently: Take 40 mg by mouth every evening.)   polyethylene glycol (MIRALAX / GLYCOLAX) 17 g packet Take 17 g by mouth daily. (Patient taking differently: Take 17 g by mouth daily as needed for moderate constipation.)   [DISCONTINUED] azithromycin (ZITHROMAX) 250 MG tablet Take a tab daily   [DISCONTINUED] cloNIDine (CATAPRES) 0.2 MG tablet Take 1 tablet (0.2 mg total) by mouth 3 (three) times daily. (Patient taking differently: Take 0.2-0.3 mg by mouth 3 (three) times daily. Takes 0.2 mg in the morning 0.3 mg in the afternoon and night.)     Review of Systems      All other systems reviewed and are otherwise negative except as noted above.  Physical Exam    VS:  BP 140/70   Pulse 60   Ht 5' 5.5" (1.664 m)   Wt 123 lb (55.8 kg)   BMI 20.16 kg/m  , BMI Body mass index is 20.16 kg/m.  Wt Readings from Last 3 Encounters:  06/20/21 123 lb (55.8 kg)  03/24/21 121 lb 11.1 oz (55.2 kg)  02/28/21 128 lb (58.1 kg)     GEN: Well nourished, well developed, in no acute distress. HEENT: normal. Neck: Supple, no JVD, carotid bruits, or masses. Cardiac: RRR, no murmurs, rubs, or gallops. No clubbing, cyanosis, edema.  Radials/PT 2+ and equal bilaterally.  Respiratory:  Respirations regular and unlabored, clear to auscultation bilaterally. GI: Soft, nontender, nondistended. MS: No deformity or atrophy. Skin: Warm and dry, no rash. Neuro:  Strength and sensation are intact. Psych: Normal  affect.  Assessment &  Plan    HTN - Elevated at home this morning 208/105 prior to medications though 140/70 in clinic. Discussed to check BP at least 1-2 hours after medications.  Blood pressure has been labile at home that she did not bring log or her blood pressure cuff.  Encouraged to bring cuff to next office visit.  Continue carvedilol 12.5 mg twice daily-encouraged to take her doses 12 hours apart.  Continue furosemide 40 mg daily.  We will change clonidine to take 0 point 2 in the morning, 0 point 3 in the afternoon, 0 point 2 in the evening.  She has been doing this intermittently at home with some improvement in her blood pressure.  MyChart message in one week to check in.  If BP not at goal add low-dose ARB.  Ultimately a BP goal of less than 140/90 may be reasonable.  PPM - Follows with EP   Persistent atrial fib - Continue Coreg, Digoxin, amiodarone at present doses.  No signs of toxicity.  CHA2DS2-VASc Score = 5 [CHF History: 1, HTN History: 1, Diabetes History: 0, Stroke History: 0, Vascular Disease History: 0, Age Score: 2, Gender Score: 1].  Therefore, the patient's annual risk of stroke is 7.2 %.  She does qualify for reduced dose Eliquis however given recent PE 03/2021 her dose was increased to 5 mg twice daily.  Denies bleeding complications.  Continue Eliquis '5mg'$  BID due to recent PE. May qualify for reduced dose again in the future given age >33, creat >1.5, and weight <60 kg. Will reach out to pharmacy team for input.  HFpEF / Pulmonary hypertension/ Mild AS / Mild to moderate TR -euvolemic on exam.  Previously declined further work-up for pulmonary hypertension, valvular heart disease.  Continue optimal blood pressure control.  Management detailed above.  Continue present dose Lasix.  CKDIV - Careful titration of diuretic and antihypertensive.     Disposition: Follow up as scheduled with Dr. Oval Linsey  Signed, Loel Dubonnet, NP 06/22/2021, 10:56 AM [place]

## 2021-06-20 NOTE — Telephone Encounter (Signed)
Called pt in regards to elevated BP reading of 208/105 from this am prior to taking medications.  Reports last night BP 191/92.  When asked for last week worth of BP readings pt did not have them available.  Only had reading from June 5 133/68.  Pt reports blood pressure is all over the place.  Feels that BP is elevated in the evenings.  I advised pt to check BP twice a day if able.  Once after am meds and once in the evening.   Denies HA, dizziness, SOB, altered vision and increased weakness.  Advised pt of increased risk of stroke with elevated BP.  Reports does not eat high salt content foods.  BP after taking meds today 151/70.  Pt would like to be seen in office.  Scheduled OV with Gilford Rile, NP today at 3:10 pm.  Pt thanked me for help.

## 2021-06-20 NOTE — Patient Instructions (Addendum)
Medication Instructions:   Your physician has recommended you make the following change in your medication:    CONTINUE Carvedilol 12.'5mg'$  twice daily  *try to take 12 hours apart   CHANGE Clonidine to 0.'2mg'$  in the morning, 0.'3mg'$  in the afternoon, and 0.'2mg'$  in the evening   PLEASE TAKE YOUR BLOOD PRESSURE EACH DAY BETWEEN 11-12 AND 3-4, I WILL CALL YOU IN ONE WEEK TO CHECK ON YOUR BLOOD PRESSURES. PLEASE RECORD THEM ON THE LOG GIVEN TO YOU TODAY   *If you need a refill on your cardiac medications before your next appointment, please call your pharmacy*  Lab Work: None ordered today.   Testing/Procedures: None ordered today.   Follow-Up: At [facility], you and your health needs are our priority.  As part of our continuing mission to provide you with exceptional heart care, we have created designated Provider Care Teams.  These Care Teams include your primary Cardiologist (physician) and Advanced Practice Providers (APPs -  Physician Assistants and Nurse Practitioners) who all work together to provide you with the care you need, when you need it.  We recommend signing up for the patient portal called "MyChart".  Sign up information is provided on this After Visit Summary.  MyChart is used to connect with patients for Virtual Visits (Telemedicine).  Patients are able to view lab/test results, encounter notes, upcoming appointments, etc.  Non-urgent messages can be sent to your provider as well.   To learn more about what you can do with MyChart, go to [email or web address].    Your next appointment:   2 month(s)  The format for your next appointment:   In Person  Provider:   Skeet Latch, MD or Loel Dubonnet, NP     Other Instructions  Heart Healthy Diet Recommendations: A low-salt diet is recommended. Meats should be grilled, baked, or boiled. Avoid fried foods. Focus on lean protein sources like fish or chicken with vegetables and fruits. The American Heart  Association is a Microbiologist!  American Heart Association Diet and Lifeystyle Recommendations   Exercise recommendations: The American Heart Association recommends 150 minutes of moderate intensity exercise weekly. Try 30 minutes of moderate intensity exercise 4-5 times per week. This could include walking, jogging, or swimming.  Important Information About Sugar

## 2021-06-20 NOTE — Telephone Encounter (Signed)
Pt c/o BP issue: STAT if pt c/o blurred vision, one-sided weakness or slurred speech  1. What are your last 5 BP readings? 208/105 (before meds ); 191/92  2. Are you having any other symptoms (ex. Dizziness, headache, blurred vision, passed out)? No   3. What is your BP issue? Been too high

## 2021-06-21 ENCOUNTER — Other Ambulatory Visit: Payer: Self-pay | Admitting: *Deleted

## 2021-06-21 ENCOUNTER — Encounter: Payer: Self-pay | Admitting: *Deleted

## 2021-06-21 NOTE — Patient Outreach (Signed)
[facility]) Care Management  06/21/2021  Allison Norman 08-11-1924 [id number]   Telephone Screening-Unsuccessful #1  RN attempted outreach call however unsuccessful. RN able to leave a HIPAA approved voice message to the mobile line requesting a call back.  Will send outreach letters and attempt another call over the next week for pending [facility] services.  Raina Mina, RN Care Management Coordinator [place] Office [phone]

## 2021-06-22 ENCOUNTER — Encounter (HOSPITAL_BASED_OUTPATIENT_CLINIC_OR_DEPARTMENT_OTHER): Payer: Self-pay | Admitting: Family

## 2021-06-27 ENCOUNTER — Ambulatory Visit: Payer: Self-pay | Admitting: *Deleted

## 2021-06-27 ENCOUNTER — Telehealth (HOSPITAL_BASED_OUTPATIENT_CLINIC_OR_DEPARTMENT_OTHER): Payer: Self-pay | Admitting: Family

## 2021-06-27 NOTE — Telephone Encounter (Signed)
Called Miss Couser to check on her blood pressure.  She provides the following readings: 134/71, 110/61, 179/89 (evening),  122/68  Notes BP has overall been better controlled than previously.  Still slightly elevated in the evening though not happening as often.  She takes her clonidine when she wakes up in the morning between 9:30 and 10, 2 PM, bedtime.  We discussed trying to space apart as close to 8 hours as possible to keep level consistent in her system.    As BP is overall well controlled we have elected not to add low-dose ARB at this time but could be considered in the future.  Alver Sorrow, NP

## 2021-07-11 DIAGNOSIS — R338 Other retention of urine: Secondary | ICD-10-CM | POA: Diagnosis not present

## 2021-07-11 DIAGNOSIS — N319 Neuromuscular dysfunction of bladder, unspecified: Secondary | ICD-10-CM | POA: Diagnosis not present

## 2021-07-19 ENCOUNTER — Ambulatory Visit (INDEPENDENT_AMBULATORY_CARE_PROVIDER_SITE_OTHER): Payer: Medicare Other

## 2021-07-19 DIAGNOSIS — I495 Sick sinus syndrome: Secondary | ICD-10-CM

## 2021-07-19 LAB — CUP PACEART REMOTE DEVICE CHECK
Battery Remaining Longevity: 84 mo
Battery Remaining Percentage: 100 %
Brady Statistic RA Percent Paced: 74 %
Brady Statistic RV Percent Paced: 18 %
Date Time Interrogation Session: 20230714022200
Implantable Lead Implant Date: 20210716
Implantable Lead Implant Date: 20210716
Implantable Lead Location: 753859
Implantable Lead Location: 753860
Implantable Lead Model: 7840
Implantable Lead Model: 7841
Implantable Lead Serial Number: 1017229
Implantable Lead Serial Number: 1090224
Implantable Pulse Generator Implant Date: 20210716
Lead Channel Impedance Value: 625 Ohm
Lead Channel Impedance Value: 687 Ohm
Lead Channel Pacing Threshold Amplitude: 0.4 V
Lead Channel Pacing Threshold Amplitude: 0.8 V
Lead Channel Pacing Threshold Pulse Width: 0.4 ms
Lead Channel Pacing Threshold Pulse Width: 0.4 ms
Lead Channel Setting Pacing Amplitude: 2 V
Lead Channel Setting Pacing Amplitude: 2.5 V
Lead Channel Setting Pacing Pulse Width: 0.4 ms
Lead Channel Setting Sensing Sensitivity: 2.5 mV
Pulse Gen Serial Number: 547553

## 2021-07-23 ENCOUNTER — Encounter (INDEPENDENT_AMBULATORY_CARE_PROVIDER_SITE_OTHER): Payer: Self-pay | Admitting: Ophthalmology

## 2021-07-23 ENCOUNTER — Ambulatory Visit (INDEPENDENT_AMBULATORY_CARE_PROVIDER_SITE_OTHER): Payer: Medicare Other | Admitting: Ophthalmology

## 2021-07-23 DIAGNOSIS — H353211 Exudative age-related macular degeneration, right eye, with active choroidal neovascularization: Secondary | ICD-10-CM | POA: Diagnosis not present

## 2021-07-23 DIAGNOSIS — H353124 Nonexudative age-related macular degeneration, left eye, advanced atrophic with subfoveal involvement: Secondary | ICD-10-CM

## 2021-07-23 DIAGNOSIS — H01005 Unspecified blepharitis left lower eyelid: Secondary | ICD-10-CM | POA: Diagnosis not present

## 2021-07-23 DIAGNOSIS — H01002 Unspecified blepharitis right lower eyelid: Secondary | ICD-10-CM | POA: Insufficient documentation

## 2021-07-23 DIAGNOSIS — H353113 Nonexudative age-related macular degeneration, right eye, advanced atrophic without subfoveal involvement: Secondary | ICD-10-CM

## 2021-07-23 MED ORDER — AFLIBERCEPT 2MG/0.05ML IZ SOLN FOR KALEIDOSCOPE
2.0000 mg | INTRAVITREAL | Status: AC | PRN
  Administered 2021-07-23: 2 mg via INTRAVITREAL

## 2021-07-23 MED ORDER — BACITRACIN-POLYMYXIN B 500-10000 UNIT/GM OP OINT: TOPICAL_OINTMENT | Freq: Two times a day (BID) | OPHTHALMIC | 0 refills | Status: AC

## 2021-07-23 NOTE — Assessment & Plan Note (Signed)
Atrophy accounts for acuity 

## 2021-07-23 NOTE — Assessment & Plan Note (Signed)
We will offer topical erythromycin ophthalmic ointment nightly to each eye

## 2021-07-23 NOTE — Progress Notes (Signed)
07/23/2021     CHIEF COMPLAINT Patient presents for  Chief Complaint  Patient presents with   Macular Degeneration      HISTORY OF PRESENT ILLNESS: Allison Norman is a 88 y.o. female who presents to the clinic today for:   HPI   3 MOS For DILATE OU, EYLEA OCT, OD. Pt stated vision has gotten a little worse.  Last edited by Silvestre Moment on 07/23/2021  1:16 PM.      Referring physician: [name], MD [place],  [place] [address]  HISTORICAL INFORMATION:   Selected notes from the MEDICAL RECORD NUMBER       CURRENT MEDICATIONS: Current Outpatient Medications (Ophthalmic Drugs)  Medication Sig   bacitracin-polymyxin b (POLYSPORIN) ophthalmic ointment Place into the left eye 2 (two) times daily for 10 days. Place a 1/2 inch ribbon of ointment into the lower eyelid.   Aflibercept (EYLEA) 2 MG/0.05ML SOLN 2 mg by Intravitreal route every 3 (three) months. Inject in R eye every 11 weeks per Retina specialist for Macular Degenration   No current facility-administered medications for this visit. (Ophthalmic Drugs)   Current Outpatient Medications (Other)  Medication Sig   acetaminophen (TYLENOL) 500 MG tablet Take 1,000 mg by mouth every 6 (six) hours as needed for moderate pain.   amiodarone (PACERONE) 200 MG tablet Take 1/2 (one-half) tablet by mouth once daily (Patient taking differently: Take 100 mg by mouth daily.)   apixaban (ELIQUIS) 5 MG TABS tablet Take 1 tablet (5 mg total) by mouth 2 (two) times daily.   carvedilol (COREG) 12.5 MG tablet Take 1 tablet (12.5 mg total) by mouth 2 (two) times daily with a meal.   cloNIDine (CATAPRES) 0.2 MG tablet Take 0.'2mg'$  in the morning, 0.'3mg'$  in the afternoon at 2pm, and 0.'2mg'$  at bedtime.   cloNIDine (CATAPRES) 0.3 MG tablet Take 0.'2mg'$  in the morning, 0.'3mg'$  in the afternoon at 2pm, and 0.'2mg'$  at bedtime.   dextromethorphan (DELSYM) 30 MG/5ML liquid Take 30 mg by mouth daily as needed for cough.   diazepam  (VALIUM) 2 MG tablet Take 2 mg by mouth at bedtime as needed for anxiety.   digoxin (LANOXIN) 0.125 MG tablet Take 1 tablet (0.125 mg total) by mouth every other day.   furosemide (LASIX) 40 MG tablet Take 40 mg by mouth daily.   ipratropium (ATROVENT) 0.06 % nasal spray Place 2 sprays into both nostrils 2 (two) times daily.   Melatonin 10 MG CAPS Take 10 mg by mouth at bedtime.   Multiple Vitamins-Minerals (PRESERVISION AREDS 2) CAPS Take 1 capsule by mouth daily.   pantoprazole (PROTONIX) 40 MG tablet Take 1 tablet (40 mg total) by mouth daily. (Patient taking differently: Take 40 mg by mouth every evening.)   polyethylene glycol (MIRALAX / GLYCOLAX) 17 g packet Take 17 g by mouth daily. (Patient taking differently: Take 17 g by mouth daily as needed for moderate constipation.)   No current facility-administered medications for this visit. (Other)      REVIEW OF SYSTEMS: ROS   Negative for: Constitutional, Gastrointestinal, Neurological, Skin, Genitourinary, Musculoskeletal, HENT, Endocrine, Cardiovascular, Eyes, Respiratory, Psychiatric, Allergic/Imm, Heme/Lymph Last edited by Silvestre Moment on 07/23/2021  1:16 PM.       ALLERGIES Allergies  Allergen Reactions   Codeine Itching   Penicillins Itching, Rash and Other (See Comments)    Has patient had a PCN reaction causing immediate rash, facial/tongue/throat swelling, SOB or lightheadedness with hypotension: Yes Has patient had a PCN  reaction causing severe rash involving mucus membranes or skin necrosis: No Has patient had a PCN reaction that required hospitalization: No Has patient had a PCN reaction occurring within the last 10 years: No If all of the above answers are "NO", then may proceed with Cephalosporin use.   Doxazosin     Heavy feeling in chest, congestion, wheezing   Doxycycline Other (See Comments)    Cause chest tightness   Hydralazine     Felt bad and could hear pulse in head     PAST MEDICAL HISTORY Past Medical  History:  Diagnosis Date   Atrial fibrillation ([facility]) 2017   a. s/p DCCV in 10/2015  b. recurrent in 08/2016 --> rate-control pursued.    Cancer ([facility])    Breast   CKD (chronic kidney disease)    GCA (giant cell arteritis) (HCC)    Hordeolum internum left lower eyelid 06/28/2019   Hypertension    Macular degeneration    Osteoporosis    PMR (polymyalgia rheumatica) (HCC)    Pulmonary hypertension, unspecified ([facility]) 02/14/2021   Resistant hypertension 03/15/2019   Shortness of breath 03/15/2019   Skin cancer    Past Surgical History:  Procedure Laterality Date   CARDIOVERSION N/A 10/18/2015   Procedure: CARDIOVERSION;  Surgeon: Jerline Pain, MD;  Location: [facility];  Service: Cardiovascular;  Laterality: N/A;   CARDIOVERSION N/A 02/20/2017   Procedure: CARDIOVERSION;  Surgeon: Pixie Casino, MD;  Location: [facility];  Service: Cardiovascular;  Laterality: N/A;   CARDIOVERSION N/A 06/18/2017   Procedure: CARDIOVERSION;  Surgeon: Sanda Klein, MD;  Location: [facility];  Service: Cardiovascular;  Laterality: N/A;   CARDIOVERSION N/A 12/21/2018   Procedure: CARDIOVERSION;  Surgeon: Thayer Headings, MD;  Location[facility] ENDOSCOPY;  Service: Cardiovascular;  Laterality: N/A;   IR CATHETER TUBE CHANGE  02/28/2021   KNEE SURGERY  2013   MASTECTOMY  1998   left side   PACEMAKER IMPLANT N/A 07/22/2019   Procedure: PACEMAKER IMPLANT;  Surgeon: Evans Lance, MD;  Location: [facility];  Service: Cardiovascular;  Laterality: N/A;    FAMILY HISTORY Family History  Problem Relation Age of Onset   Stroke Mother    Kidney failure Father        died at 65   Heart disease Sister        died at 36   Heart disease Sister        died at 20   Breast cancer Daughter     SOCIAL HISTORY Social History   Tobacco Use   Smoking status: Former    Types: Cigarettes    Quit date: 08/31/1966    Years since quitting: 54.9   Smokeless tobacco: Never  Vaping Use   Vaping Use: Never  used  Substance Use Topics   Alcohol use: No   Drug use: No         OPHTHALMIC EXAM:  Base Eye Exam     Visual Acuity (ETDRS)       Right Left   Dist cc 20/50 CF at 2'   Dist ph cc NI     Correction: Glasses         Tonometry (Tonopen, 1:20 PM)       Right Left   Pressure 12 12         Pupils       Pupils APD   Right PERRL None   Left PERRL None         Visual  Fields       Left Right     Full   Restrictions Partial inner superior temporal, inferior temporal, superior nasal, inferior nasal deficiencies          Extraocular Movement       Right Left    Full Full         Neuro/Psych     Oriented x3: Yes   Mood/Affect: Normal         Dilation     Both eyes: 1.0% Mydriacyl, 2.5% Phenylephrine @ 1:20 PM           Slit Lamp and Fundus Exam     External Exam       Right Left   External Normal Normal         Slit Lamp Exam       Right Left   Lids/Lashes 1+ Collarettes, Irregular lid margins, no active hordoleum or stye, 1+ Meibomian gland dysfunction 1+ Meibomian gland dysfunction   Conjunctiva/Sclera White and quiet White and quiet   Cornea Clear Clear   Anterior Chamber Deep and quiet Deep and quiet   Iris Round and reactive Round and reactive   Lens Posterior chamber intraocular lens, Open posterior capsule Posterior chamber intraocular lens, Open posterior capsule   Anterior Vitreous Normal Normal         Fundus Exam       Right Left   Posterior Vitreous Normal    Disc Normal    C/D Ratio 0.45    Macula Geographic atrophy approaching nasal FAZ, Advanced age related macular degeneration, Disciform scar nasal to faz, , Drusen, Retinal pigment epithelial mottling    Vessels Normal    Periphery Normal             IMAGING AND PROCEDURES  Imaging and Procedures for 07/23/21  OCT, Retina - OU - Both Eyes       Right Eye Quality was good. Scan locations included subfoveal. Central Foveal Thickness: 294.  Progression has improved. Findings include disciform scar, intraretinal fluid, pigment epithelial detachment, subretinal scarring, vitreomacular adhesion .   Left Eye Quality was good. Scan locations included subfoveal. Central Foveal Thickness: 295. Progression has been stable. Findings include no SRF, retinal drusen , subretinal hyper-reflective material, subretinal scarring, central retinal atrophy, outer retinal atrophy.   Notes Pigment epithelial detachment with intraretinal fluid nasal to the fovea OD is stable,, at 3 months interval follow-up today, repeat injection Eylea today examination next 9 weeks  OS with advanced dry atrophic ARMD stable, no change in thickness OS, the computer selected lower baseline     Intravitreal Injection, Pharmacologic Agent - OD - Right Eye       Time Out 07/23/2021. 1:46 PM. Confirmed correct patient, procedure, site, and patient consented.   Anesthesia Topical anesthesia was used. Anesthetic medications included Lidocaine 4%.   Procedure Preparation included 5% betadine to ocular surface, 10% betadine to eyelids, Tobramycin 0.3%, Ofloxacin . A 30 gauge needle was used.   Injection: 2 mg aflibercept 2 MG/0.05ML   Route: Intravitreal, Site: Right Eye   NDC: [id number], Lot: [id number], Expiration date: 01/06/2022, Waste: 0 mL   Post-op Post injection exam found visual acuity of at least counting fingers. The patient tolerated the procedure well. There were no complications. The patient received written and verbal post procedure care education. Post injection medications included ocuflox.              ASSESSMENT/PLAN:  Advanced nonexudative age-related macular degeneration of left  eye with subfoveal involvement Atrophy accounts for acuity  Advanced nonexudative age-related macular degeneration of right eye without subfoveal involvement Encroaching near the FAZ  Exudative age-related macular degeneration of right eye with active  choroidal neovascularization (HCC) Chronic active disease OD yet stable at 64-monthinterval today on Eylea.  We will repeat injection today to maintain and reevaluate again in 8 to 9 weeks  Blepharitis of lower eyelids of both eyes We will offer topical erythromycin ophthalmic ointment nightly to each eye     ICD-10-CM   1. Exudative age-related macular degeneration of right eye with active choroidal neovascularization (HCC)  H35.3211 OCT, Retina - OU - Both Eyes    Intravitreal Injection, Pharmacologic Agent - OD - Right Eye    aflibercept (EYLEA) SOLN 2 mg    2. Advanced nonexudative age-related macular degeneration of left eye with subfoveal involvement  H35.3124     3. Advanced nonexudative age-related macular degeneration of right eye without subfoveal involvement  H35.3113     4. Blepharitis of lower eyelids of both eyes, unspecified type  H01.002    H01.005       1.  Injection intravitreal Eylea today for wet AMD control currently at 324-monthnterval.  Doing well.  2.  Mild chronic blepharitis.  Will treat with topical antibiotic nightly, Polysporin ophthalmic ointment  3.  Ophthalmic Meds Ordered this visit:  Meds ordered this encounter  Medications   aflibercept (EYLEA) SOLN 2 mg   bacitracin-polymyxin b (POLYSPORIN) ophthalmic ointment    Sig: Place into the left eye 2 (two) times daily for 10 days. Place a 1/2 inch ribbon of ointment into the lower eyelid.    Dispense:  3.5 g    Refill:  0       Return in about 8 weeks (around 09/17/2021) for dilate, OD, EYLEA OCT.  There are no Patient Instructions on file for this visit.   Explained the diagnoses, plan, and follow up with the patient and they expressed understanding.  Patient expressed understanding of the importance of proper follow up care.   GaClent Demarkankin M.D. Diseases & Surgery of the Retina and Vitreous [facility]7/18/23     Abbreviations: M myopia (nearsighted); A  astigmatism; H hyperopia (farsighted); P presbyopia; Mrx spectacle prescription;  CTL contact lenses; OD right eye; OS left eye; OU both eyes  XT exotropia; ET esotropia; PEK punctate epithelial keratitis; PEE punctate epithelial erosions; DES dry eye syndrome; MGD meibomian gland dysfunction; ATs artificial tears; PFAT's preservative free artificial tears; [place] sclerotic cataract; PSC posterior subcapsular cataract; ERM epi-retinal membrane; PVD posterior vitreous detachment; RD retinal detachment; DM diabetes mellitus; DR diabetic retinopathy; NPDR non-proliferative diabetic retinopathy; PDR proliferative diabetic retinopathy; CSME clinically significant macular edema; DME diabetic macular edema; dbh dot blot hemorrhages; CWS cotton wool spot; POAG primary open angle glaucoma; C/D cup-to-disc ratio; HVF humphrey visual field; GVF goldmann visual field; OCT optical coherence tomography; IOP intraocular pressure; BRVO Branch retinal vein occlusion; CRVO central retinal vein occlusion; CRAO central retinal artery occlusion; BRAO branch retinal artery occlusion; RT retinal tear; SB scleral buckle; PPV pars plana vitrectomy; VH Vitreous hemorrhage; PRP panretinal laser photocoagulation; IVK intravitreal kenalog; VMT vitreomacular traction; MH Macular hole;  NVD neovascularization of the disc; NVE neovascularization elsewhere; AREDS age related eye disease study; ARMD age related macular degeneration; POAG primary open angle glaucoma; EBMD epithelial/anterior basement membrane dystrophy; ACIOL anterior chamber intraocular lens; IOL intraocular lens; PCIOL posterior chamber intraocular lens; Phaco/IOL phacoemulsification with intraocular  lens placement; [place] photorefractive keratectomy; LASIK laser assisted in situ keratomileusis; HTN hypertension; DM diabetes mellitus; COPD chronic obstructive pulmonary disease

## 2021-07-23 NOTE — Assessment & Plan Note (Signed)
Chronic active disease OD yet stable at 3-monthinterval today on Eylea.  We will repeat injection today to maintain and reevaluate again in 8 to 9 weeks

## 2021-07-23 NOTE — Assessment & Plan Note (Signed)
Encroaching near the [facility]

## 2021-07-31 DIAGNOSIS — R059 Cough, unspecified: Secondary | ICD-10-CM | POA: Diagnosis not present

## 2021-07-31 DIAGNOSIS — K219 Gastro-esophageal reflux disease without esophagitis: Secondary | ICD-10-CM | POA: Diagnosis not present

## 2021-08-01 NOTE — Progress Notes (Signed)
Remote pacemaker transmission.   

## 2021-08-09 NOTE — Progress Notes (Signed)
Cardiology Office Note Date:  08/09/2021  Patient ID:  Allison, Norman [birth year], MRN [id number] PCP:  [name], MD  Cardiologist:  Dr. Oval Linsey Electrophysiologist: Dr. Lovena Le    Chief Complaint:  81mof/u  History of Present Illness: Allison ENTWISTLEis a 88y.o. female with history of resistant HTN, CKD (IV), giant cell arteritis, PMR, Afib, chronic CHF (diastolic), tachy-brady w/PPM, PE.  Severe p.HTN 67mg, on her echo jan 2022, grade III DD > pt preferred not to pursue further eval/management  She last saw cardiology team 10/16/20 by Allison MohairNP, felt to be euvolemic, following with cardiology, pharmacy team for her HTN, was controlled and doing well, no changes were made.  She comes in today to be seen for Dr. TaLovena Lelast seen by him 11/26/20, she was sedentary, doing OK though, issues with incontinence/UTIs and had a foley She was on amiodarone in an atypical flutter and noted AF burden about 1/3 of the time, with increased rates, dig was added QOD, perhaps my need to increase her amio. Planned for f/u  Pt has called with concerns of unusually elevated BP of late and concerned about the dig  I saw her 01/29/21 She is accompanied by her caregiver, Allison Norman has been with her for a few years now. She has felt better, when checking her HR is steady at 70. No CP, palpitations She has some SOB, particularly when rolling over/changing positions in bed, settles quickly, no SOB with casual/her usual pace of walking, ADLS Her BP is better, says back in TID clonidine in communication with Dr. RaOval LinseyNo bleeding or signs of bleeding Remains w/foley, follows w/urology Her Afib burden had been 99% but recently converted No changes were made, planned for a dig level  Dig was 0.6   She was hospitalized 03/24/21 - 03/28/21 with SOB, she was hypoxic and found with acute PE.  Her eliquis held > heparin. Hematology recommended up-titration of her Eliquis to '5mg'$  BID She  was also treated for pneumonia She discharged on O2  She saw C. Walker NP 06/20/21 with concerns of high BPs at home self increasing her clonidine, her clonidine was adjusted and no other changes were made. Eliquis dosing deferred to hematology.  TODAY She is doing well from a cardiac perspective Whishes she was more steady on her feet Wakes with a hoarse voice. No CP, SOB She doesn't think she has had much if any Afib No near syncope or syncope. No bleeding or signs of bleeding   Device information BSci dual chamber PPM implanted 07/22/2019   Past Medical History:  Diagnosis Date   Atrial fibrillation (HCTrego2017   a. s/p DCCV in 10/2015  b. recurrent in 08/2016 --> rate-control pursued.    [place] ([facility] [place]   Breast   CKD (chronic kidney disease)    GCA (giant cell arteritis) (HCC)    Hordeolum internum left lower eyelid 06/28/2019   Hypertension    Macular degeneration    Osteoporosis    PMR (polymyalgia rheumatica) (HCC)    Pulmonary hypertension, unspecified ([facility]2/09/2021   Resistant hypertension 03/15/2019   Shortness of breath 03/15/2019   Skin cancer     Past Surgical History:  Procedure Laterality Date   CARDIOVERSION N/A 10/18/2015   Procedure: CARDIOVERSION;  Surgeon: MaJerline PainMD;  [place] [facility]: [facility];  Laterality: N/A;   CARDIOVERSION N/A 02/20/2017   Procedure: CARDIOVERSION;  Surgeon: HiPixie CasinoMD;  [facility]  Service: Cardiovascular;  Laterality: N/A;   CARDIOVERSION N/A 06/18/2017   Procedure: CARDIOVERSION;  Surgeon: Sanda Klein, MD;  Location: [facility]COPY;  Service: Cardiovascular;  Laterality: N/A;   CARDIOVERSION N/A 12/21/2018   Procedure: CARDIOVERSION;  Surgeon: Thayer Headings, MD;  Location[facility] ENDOSCOPY;  Service: Cardiovascular;  Laterality: N/A;   IR CATHETER TUBE CHANGE  02/28/2021   KNEE SURGERY  2013   MASTECTOMY  1998   left side   PACEMAKER IMPLANT N/A 07/22/2019   Procedure:  PACEMAKER IMPLANT;  Surgeon: Evans Lance, MD;  Location: [facility] CV LAB;  Service: Cardiovascular;  Laterality: N/A;    Current Outpatient Medications  Medication Sig Dispense Refill   acetaminophen (TYLENOL) 500 MG tablet Take 1,000 mg by mouth every 6 (six) hours as needed for moderate pain.     Aflibercept (EYLEA) 2 MG/0.05ML SOLN 2 mg by Intravitreal route every 3 (three) months. Inject in R eye every 11 weeks per Retina specialist for Macular Degenration     amiodarone (PACERONE) 200 MG tablet Take 1/2 (one-half) tablet by mouth once daily (Patient taking differently: Take 100 mg by mouth daily.) 45 tablet 1   apixaban (ELIQUIS) 5 MG TABS tablet Take 1 tablet (5 mg total) by mouth 2 (two) times daily. 60 tablet 3   carvedilol (COREG) 12.5 MG tablet Take 1 tablet (12.5 mg total) by mouth 2 (two) times daily with a meal. 180 tablet 3   cloNIDine (CATAPRES) 0.2 MG tablet Take 0.'2mg'$  in the morning, 0.'3mg'$  in the afternoon at 2pm, and 0.'2mg'$  at bedtime. 180 tablet 1   cloNIDine (CATAPRES) 0.3 MG tablet Take 0.'2mg'$  in the morning, 0.'3mg'$  in the afternoon at 2pm, and 0.'2mg'$  at bedtime. 90 tablet 1   dextromethorphan (DELSYM) 30 MG/5ML liquid Take 30 mg by mouth daily as needed for cough.     diazepam (VALIUM) 2 MG tablet Take 2 mg by mouth at bedtime as needed for anxiety.  0   digoxin (LANOXIN) 0.125 MG tablet Take 1 tablet (0.125 mg total) by mouth every other day. 45 tablet 3   furosemide (LASIX) 40 MG tablet Take 40 mg by mouth daily.     ipratropium (ATROVENT) 0.06 % nasal spray Place 2 sprays into both nostrils 2 (two) times daily.     Melatonin 10 MG CAPS Take 10 mg by mouth at bedtime.     Multiple Vitamins-Minerals (PRESERVISION AREDS 2) CAPS Take 1 capsule by mouth daily.     pantoprazole (PROTONIX) 40 MG tablet Take 1 tablet (40 mg total) by mouth daily. (Patient taking differently: Take 40 mg by mouth every evening.) 30 tablet 1   polyethylene glycol (MIRALAX / GLYCOLAX) 17 g packet  Take 17 g by mouth daily. (Patient taking differently: Take 17 g by mouth daily as needed for moderate constipation.) 14 each 0   No current facility-administered medications for this visit.    Allergies:   Codeine, Penicillins, Doxazosin, Doxycycline, and Hydralazine   Social History:  The patient  reports that she quit smoking about 54 years ago. Her smoking use included cigarettes. She has never used smokeless tobacco. She reports that she does not drink alcohol and does not use drugs.   Family History:  The patient's family history includes Breast cancer in her daughter; Heart disease in her sister and sister; Kidney failure in her father; Stroke in her mother.  ROS:  Please see the history of present illness.    All other systems are reviewed and otherwise negative.  PHYSICAL EXAM:  VS:  There were no vitals taken for this visit. BMI: There is no height or weight on file to calculate BMI. Well nourished, well developed, in no acute distress, chronically ill appearing HEENT: normocephalic, atraumatic Neck: no JVD, carotid bruits or masses Cardiac:  RRR; no significant murmurs, no rubs, or gallops Lungs:  CTA b/l, no wheezing, rhonchi or rales Abd: soft, nontender MS: no deformity, age appropriate/perhaps advanced atrophy Ext:  no edema Skin: warm and dry, no rash Neuro:  No gross deficits appreciated Psych: euthymic mood, full affect   PPM site is stable, no tethering or discomfort, no thinning or erosion   EKG:  done today and reviewed by myself AP/VS 60  Device interrogation done today and reviewed by myself:  Battery and lead measurements are stable 8% AF burden No programming changes made  03/25/21: TTE 1. Left ventricular ejection fraction, by estimation, is 60 to 65%. The  left ventricle has normal function. The left ventricle has no regional  wall motion abnormalities. There is mild left ventricular hypertrophy.  Left ventricular diastolic parameters  were  normal.   2. Right ventricular systolic function is normal. The right ventricular  size is normal.   3. Left atrial size was moderately dilated.   4. ? prominent [employer] and pacing wires in RA/RV Cannot r/o right  sided source of embolus . Right atrial size was mildly dilated.   5. The mitral valve is degenerative. Mild mitral valve regurgitation. No  evidence of mitral stenosis. Moderate mitral annular calcification.   6. Sclerotic and calcified non coronary cusp. The aortic valve is normal  in structure. There is moderate calcification of the aortic valve. There  is moderate thickening of the aortic valve. Aortic valve regurgitation is  not visualized. Mild to moderate  aortic valve stenosis.   7. The inferior vena cava is normal in size with greater than 50%  respiratory variability, suggesting right atrial pressure of 3 mmHg.   01/30/2020: TTE 1. Left ventricular ejection fraction, by estimation, is 65 to 70%. The  left ventricle has normal function. The left ventricle has no regional  wall motion abnormalities. There is severe concentric left ventricular  hypertrophy. Left ventricular diastolic   parameters are consistent with Grade III diastolic dysfunction  (restrictive). Elevated left atrial pressure.   2. Right ventricular systolic function is mildly reduced. The right  ventricular size is mildly enlarged. There is severely elevated pulmonary  artery systolic pressure. The estimated right ventricular systolic  pressure is 71.2 mmHg.   3. Left atrial size was severely dilated.   4. Right atrial size was severely dilated.   5. The mitral valve is normal in structure. Mild to moderate mitral valve  regurgitation. No evidence of mitral stenosis. Moderate mitral annular  calcification.   6. Tricuspid valve regurgitation is moderate to severe.   7. The aortic valve is normal in structure. There is severe calcifcation  of the aortic valve. There is severe thickening of the  aortic valve.  Aortic valve regurgitation is not visualized. Mild aortic valve stenosis.  Aortic valve mean gradient measures  12.0 mmHg.   8. The inferior vena cava is normal in size with greater than 50%  respiratory variability, suggesting right atrial pressure of 3 mmHg.   Recent Labs: 03/24/2021: B Natriuretic Peptide 484.3 03/25/2021: ALT 12; BUN 30; Creatinine, Ser 1.66; Magnesium 2.4; Potassium 3.8; Sodium 134; TSH 2.296 03/28/2021: Hemoglobin 10.5; Platelets 215  No results found for requested labs within  last 365 days.   CrCl cannot be calculated (Patient's most recent lab result is older than the maximum 21 days allowed.).   Wt Readings from Last 3 Encounters:  06/20/21 123 lb (55.8 kg)  03/24/21 121 lb 11.1 oz (55.2 kg)  02/28/21 128 lb (58.1 kg)     Other studies reviewed: Additional studies/records reviewed today include: summarized above  ASSESSMENT AND PLAN:  PPM Intact function No programming changes made  Persistent AFib CHA2DS2Vasc is 5, on eliquis, current dosing as per hematology s/p PE 8 % burden Chronic low dose amio Dig (took it today, last dig level was OK) Labs today  Chronic CHF P.HTN No symptoms or exam findings of volume OL Dr. Oval Linsey   Arterial HTN (resistant) Wobbles up/down, sometimes she will skip a clonidine dose if her BP is a little low  Dr. Chaney Born   Disposition: she sees cardiology and her PMD through the year, we can see her annually for EP, sooner if needed  Current medicines are reviewed at length with the patient today.  The patient did not have any concerns regarding medicines.  Venetia Night, PA-C 08/09/2021 3:23 PM     [place] [address] [phone] (office)  [phone] (fax)

## 2021-08-12 ENCOUNTER — Encounter: Payer: Self-pay | Admitting: Physician Assistant

## 2021-08-12 ENCOUNTER — Ambulatory Visit (INDEPENDENT_AMBULATORY_CARE_PROVIDER_SITE_OTHER): Payer: Medicare Other | Admitting: Physician Assistant

## 2021-08-12 VITALS — BP 140/80 | HR 60 | Ht 65.5 in | Wt 125.6 lb

## 2021-08-12 DIAGNOSIS — I480 Paroxysmal atrial fibrillation: Secondary | ICD-10-CM

## 2021-08-12 DIAGNOSIS — I495 Sick sinus syndrome: Secondary | ICD-10-CM | POA: Diagnosis not present

## 2021-08-12 DIAGNOSIS — I10 Essential (primary) hypertension: Secondary | ICD-10-CM

## 2021-08-12 DIAGNOSIS — Z950 Presence of cardiac pacemaker: Secondary | ICD-10-CM

## 2021-08-12 LAB — CUP PACEART INCLINIC DEVICE CHECK
Date Time Interrogation Session: 20230807155050
Implantable Lead Implant Date: 20210716
Implantable Lead Implant Date: 20210716
Implantable Lead Location: 753859
Implantable Lead Location: 753860
Implantable Lead Model: 7840
Implantable Lead Model: 7841
Implantable Lead Serial Number: 1017229
Implantable Lead Serial Number: 1090224
Implantable Pulse Generator Implant Date: 20210716
Lead Channel Impedance Value: 654 Ohm
Lead Channel Impedance Value: 744 Ohm
Lead Channel Pacing Threshold Amplitude: 0.7 V
Lead Channel Pacing Threshold Amplitude: 0.7 V
Lead Channel Pacing Threshold Pulse Width: 0.4 ms
Lead Channel Pacing Threshold Pulse Width: 0.4 ms
Lead Channel Sensing Intrinsic Amplitude: 25 mV
Lead Channel Setting Pacing Amplitude: 2 V
Lead Channel Setting Pacing Amplitude: 2.5 V
Lead Channel Setting Pacing Pulse Width: 0.4 ms
Lead Channel Setting Sensing Sensitivity: 2.5 mV
Pulse Gen Serial Number: 547553

## 2021-08-12 NOTE — Patient Instructions (Addendum)
Medication Instructions:  Your physician recommends that you continue on your current medications as directed. Please refer to the Current Medication list given to you today.  *If you need a refill on your cardiac medications before your next appointment, please call your pharmacy*  Lab Work: TODAY: CMET, CBC, TSH If you have labs (blood work) drawn today and your tests are completely normal, you will receive your results only by: [place] (if you have MyChart) OR A paper copy in the mail If you have any lab test that is abnormal or we need to change your treatment, we will call you to review the results.  Follow-Up: At [facility], you and your health needs are our priority.  As part of our continuing mission to provide you with exceptional heart care, we have created designated Provider Care Teams.  These Care Teams include your primary Cardiologist (physician) and Advanced Practice Providers (APPs -  Physician Assistants and Nurse Practitioners) who all work together to provide you with the care you need, when you need it.  Your next appointment:   1 year(s)  The format for your next appointment:   In Person  Provider:   You may see Cristopher Peru, MD or one of the following Advanced Practice Providers on your designated Care Team:   Tommye Standard, [place] Legrand Como "Jonni Sanger" Chalmers Cater, [place]    Important Information About Sugar

## 2021-08-13 LAB — COMPREHENSIVE METABOLIC PANEL
ALT: 11 IU/L (ref 0–32)
AST: 19 IU/L (ref 0–40)
Albumin/Globulin Ratio: 1.3 (ref 1.2–2.2)
Albumin: 4 g/dL (ref 3.6–4.6)
Alkaline Phosphatase: 142 IU/L — ABNORMAL HIGH (ref 44–121)
BUN/Creatinine Ratio: 20 (ref 12–28)
BUN: 34 mg/dL (ref 10–36)
Bilirubin Total: 0.5 mg/dL (ref 0.0–1.2)
CO2: 26 mmol/L (ref 20–29)
Calcium: 8.7 mg/dL (ref 8.7–10.3)
Chloride: 102 mmol/L (ref 96–106)
Creatinine, Ser: 1.68 mg/dL — ABNORMAL HIGH (ref 0.57–1.00)
Globulin, Total: 3 g/dL (ref 1.5–4.5)
Glucose: 88 mg/dL (ref 70–99)
Potassium: 4.7 mmol/L (ref 3.5–5.2)
Sodium: 144 mmol/L (ref 134–144)
Total Protein: 7 g/dL (ref 6.0–8.5)
eGFR: 28 mL/min/{1.73_m2} — ABNORMAL LOW (ref >59)

## 2021-08-13 LAB — CBC
Hematocrit: 39.1 % (ref 34.0–46.6)
Hemoglobin: 12.7 g/dL (ref 11.1–15.9)
MCH: 30.2 pg (ref 26.6–33.0)
MCHC: 32.5 g/dL (ref 31.5–35.7)
MCV: 93 fL (ref 79–97)
Platelets: 252 10*3/uL (ref 150–450)
RBC: 4.2 x10E6/uL (ref 3.77–5.28)
RDW: 14.3 % (ref 11.7–15.4)
WBC: 7 10*3/uL (ref 3.4–10.8)

## 2021-08-13 LAB — TSH: TSH: 3.29 u[IU]/mL (ref 0.450–4.500)

## 2021-08-20 ENCOUNTER — Ambulatory Visit (INDEPENDENT_AMBULATORY_CARE_PROVIDER_SITE_OTHER): Payer: Medicare Other | Admitting: Cardiovascular Disease

## 2021-08-20 ENCOUNTER — Encounter (HOSPITAL_BASED_OUTPATIENT_CLINIC_OR_DEPARTMENT_OTHER): Payer: Self-pay | Admitting: Cardiovascular Disease

## 2021-08-20 VITALS — BP 134/60 | HR 60 | Ht 65.5 in | Wt 125.0 lb

## 2021-08-20 DIAGNOSIS — I4819 Other persistent atrial fibrillation: Secondary | ICD-10-CM | POA: Diagnosis not present

## 2021-08-20 DIAGNOSIS — I10 Essential (primary) hypertension: Secondary | ICD-10-CM

## 2021-08-20 DIAGNOSIS — I350 Nonrheumatic aortic (valve) stenosis: Secondary | ICD-10-CM | POA: Insufficient documentation

## 2021-08-20 HISTORY — DX: Nonrheumatic aortic (valve) stenosis: I35.0

## 2021-08-20 MED ORDER — CARVEDILOL 25 MG PO TABS: 25.0000 mg | ORAL_TABLET | Freq: Two times a day (BID) | ORAL | 3 refills | Status: DC

## 2021-08-20 NOTE — Patient Instructions (Signed)
Medication Instructions:  INCREASE- Carvedilol 25 mg by mouth twice a day  *If you need a refill on your cardiac medications before your next appointment, please call your pharmacy*   Lab Work: None Ordered  Testing/Procedures: Your physician has requested that you have an echocardiogram in March 2024. Echocardiography is a painless test that uses sound waves to create images of your heart. It provides your doctor with information about the size and shape of your heart and how well your heart's chambers and valves are working. This procedure takes approximately one hour. There are no restrictions for this procedure.   Follow-Up: At [facility], you and your health needs are our priority.  As part of our continuing mission to provide you with exceptional heart care, we have created designated Provider Care Teams.  These Care Teams include your primary Cardiologist (physician) and Advanced Practice Providers (APPs -  Physician Assistants and Nurse Practitioners) who all work together to provide you with the care you need, when you need it.  We recommend signing up for the patient portal called "MyChart".  Sign up information is provided on this After Visit Summary.  MyChart is used to connect with patients for Virtual Visits (Telemedicine).  Patients are able to view lab/test results, encounter notes, upcoming appointments, etc.  Non-urgent messages can be sent to your provider as well.   To learn more about what you can do with MyChart, go to [email or web address].    Your next appointment:   1-2 month(s)  The format for your next appointment:   In Person  Provider:   Laurann Montana, NP    Other Instructions March 2024 with Dr Oval Linsey after Echo

## 2021-08-20 NOTE — Assessment & Plan Note (Signed)
BP remains labile.  It was controlled in the office today.  At home it has ranged from 99-153/56-74.  She will increase carvedilol to '25mg'$  bid.  Continue clonidine.  We are unable to add thiazide diuretics, ACE inhibitors/or ARB's due to her renal dysfunction with CKD 4.  We are also unable to add any of the other medicine she has had intolerances to.  She will keep checking her blood pressures and bring to follow-up.

## 2021-08-20 NOTE — Assessment & Plan Note (Signed)
She is maintaining sinus rhythm on amiodarone.  Continue carvedilol, digoxin, and Eliquis.  Her Eliquis dose was increased due to PE on the 2.5 mg dose.

## 2021-08-20 NOTE — Progress Notes (Signed)
Cardiology Office Note:   Date:  08/20/2021   ID:  Allison Norman, DOB 11/25/1924, MRN [id number]  PCP:  [name], MD  Cardiologist:  Skeet Latch, MD    Referring MD: [name], MD   CC: Hypertension  History of Present Illness:    Allison Norman is a 88 y.o. female with a hx of paroxysmal atrial fibrillation, hypertension, and CKD IV here for follow up. She initially established care in the hypertension clinic 02/2019. She reports that she was diagnosed with hypertension several years ago. Prior to developing atrial fibrillation her BP was better controlled. Her medications were changed for improved rate control. She has been working with [name], PA-C for her hypertension lately. Amlodipine was increased to '10mg'$  but she developed LE edema, so amlodipine was reduced back to '5mg'$  and lasix was started. She only uses lasix sporadically. She noted that she sometimes forgets to take her evening dose of clonidine. At that time hydralazine was increased to 50 mg 3 times daily. She was also started on doxazosin. Metoprolol was switched to carvedilol. Doxazosin was discontinued due to shortness of breath and heaviness in her chest.  Of note, she had also stopped taking her furosemide. Once this restarted her breathing improved. Her blood pressure in the office seemed to be consistently elevated. When she last our pharmacist on 06/2019 her average blood pressure in the morning was 143/75 and 139/75 in the evenings. She was started on minoxidil and the dose was increased to 10 mg. Since that time she has stopped both hydralazine and doxazosin due to intolerances.  She was started on carvedilol 12.5 mg. She noted pulsing in her ears that didn't improve with flonase and Claritin. Her dose of carvedilol was reduced and the pulsing sensation in her ears improved. She was started on diltiazem but did not tolerate it due to lower extremity edema. Her clonidine was increased to 0.'3mg'$   tid. She had an abnormal pulmonary function test in 2019 but had never seen pulmonology. Carotid dopplers 01/2020 showed 1-39% stenosis bilaterally. She had an echo 01/2020 that revealed LVEF 65-70% with severe LVH and grade 3 diastolic dysfunction. It also showed severe bi-atrial enlargement and moderate to severe tricuspid regurgitation. Aortic stenosis was mild. PASP was 90mHg. We reccommended increasing her Lasix to daily. She declined further evaluation of her severe pulmonary hypertension and valvular heart disease. She saw Dr. TLovena Le11/2022 and digoxin was added.   At the last visit her blood pressure was controlled in the office, but she noted it was high at home. She was hospitalized 03/2021 with pulmonary embolism. Hematology recommended increasing her Eliquis to 5 mg twice daily. She was also treated for pneumonia.  She saw CLaurann Montana NP 06/2021 and noted that her blood pressures were high in the evenings, so the patient decided to increase her afternoon and evening doses of clonidine. Caitlin recommended that she take 0.2 mg in the morning and evening, and 0.3 mg in the afternoons.  Today, she is accompanied by a caregiver. She states she is feeling alright, although she is not sure if she has recovered from her pulmonary embolism. Ever since that time she has suffered from a lingering cough, over 3 months now. She recently went to the mountains on a trip, and noticed that her cough completely resolved. However, when she returned home her cough resumed. It seems to be worse at night, and she endorses having "a lot of phlegm." Previously she has tried [employer]  pearls with no improvement in her cough. At times she has lost her voice due to the severity of her cough. She notes that her blood pressure has been "irregular" at home. If her blood pressure is really low, she switches her clonidine to the previously prescribed dose or skips a dose. If her blood pressure is too high (mostly in the  afternoons) she usually feels asymptomatic. In clinic today her blood pressure is 134/60; she has not yet taken her mid-day dose of her antihypertensives. Previously her right leg was always a little swollen due to a prior injury and a knee replacement. Lately her swelling seems to have resolved. Most days she will walk around the house for exercise due to the hot weather. She generally feels fine while walking with no anginal symptoms. She denies any palpitations, chest pain, or shortness of breath. No lightheadedness, headaches, syncope, orthopnea, or PND.   Previous antihypertensives: Amlodipine - edema at 10 mg Doxazosin Hydralazine Diltiazem - leg swelling  Past Medical History:  Diagnosis Date   Aortic stenosis 08/20/2021   Mild-moderate.  Repeat echo in one year.   Atrial fibrillation ([place]) 2017   a. s/p DCCV in 10/2015  b. recurrent in 08/2016 --> rate-control pursued.    Cancer ([facility])    Breast   CKD (chronic kidney disease)    GCA (giant cell arteritis) (H[facility])    Hordeolum internum left lower eyelid 06/28/2019   Hypertension    Macular degeneration    Osteoporosis    PMR (polymyalgia rheumatica) ([facility])    Pulmonary hypertension, unspecified ([place]) 02/14/2021   Resistant hypertension 03/15/2019   Shortness of breath 03/15/2019   Skin cancer     Past Surgical History:  Procedure Laterality Date   CARDIOVERSION N/A 10/18/2015   Procedure: CARDIOVERSION;  Surgeon: Jerline Pain, MD;  Location: [facility];  Service: Cardiovascular;  Laterality: N/A;   CARDIOVERSION N/A 02/20/2017   Procedure: CARDIOVERSION;  Surgeon: Pixie Casino, MD;  Location: [facility];  Service: Cardiovascular;  Laterality: N/A;   CARDIOVERSION N/A 06/18/2017   Procedure: CARDIOVERSION;  Surgeon: Sanda Klein, MD;  Location: [facility];  Service: Cardiovascular;  Laterality: N/A;   CARDIOVERSION N/A 12/21/2018   Procedure: CARDIOVERSION;  Surgeon: Thayer Headings, MD;  Location[facility] ENDOSCOPY;   Service: Cardiovascular;  Laterality: N/A;   IR CATHETER TUBE CHANGE  02/28/2021   KNEE SURGERY  2013   MASTECTOMY  1998   left side   PACEMAKER IMPLANT N/A 07/22/2019   Procedure: PACEMAKER IMPLANT;  Surgeon: Evans Lance, MD;  Location: [facility];  Service: Cardiovascular;  Laterality: N/A;    Current Medications: Current Meds  Medication Sig   acetaminophen (TYLENOL) 500 MG tablet Take 1,000 mg by mouth every 6 (six) hours as needed for moderate pain.   Aflibercept (EYLEA) 2 MG/0.05ML SOLN 2 mg by Intravitreal route every 3 (three) months. Inject in R eye every 11 weeks per Retina specialist for Macular Degenration   amiodarone (PACERONE) 200 MG tablet Take 1/2 (one-half) tablet by mouth once daily   apixaban (ELIQUIS) 5 MG TABS tablet Take 1 tablet (5 mg total) by mouth 2 (two) times daily.   cloNIDine (CATAPRES) 0.2 MG tablet Take 0.'2mg'$  in the morning, 0.'3mg'$  in the afternoon at 2pm, and 0.'2mg'$  at bedtime.   cloNIDine (CATAPRES) 0.3 MG tablet Take 0.'2mg'$  in the morning, 0.'3mg'$  in the afternoon at 2pm, and 0.'2mg'$  at bedtime.   dextromethorphan (DELSYM) 30 MG/5ML liquid Take 30 mg by mouth  daily as needed for cough.   diazepam (VALIUM) 2 MG tablet Take 2 mg by mouth at bedtime as needed for anxiety.   digoxin (LANOXIN) 0.125 MG tablet Take 1 tablet (0.125 mg total) by mouth every other day.   furosemide (LASIX) 40 MG tablet Take 40 mg by mouth daily.   Melatonin 10 MG CAPS Take 10 mg by mouth at bedtime.   Multiple Vitamins-Minerals (PRESERVISION AREDS 2) CAPS Take 1 capsule by mouth daily.   polyethylene glycol (MIRALAX / GLYCOLAX) 17 g packet Take 17 g by mouth daily.   [DISCONTINUED] carvedilol (COREG) 12.5 MG tablet Take 1 tablet (12.5 mg total) by mouth 2 (two) times daily with a meal.     Allergies:   Codeine, Penicillins, Doxazosin, Doxycycline, and Hydralazine   Social History   Socioeconomic History   Marital status: Married    Spouse name: Not on file   Number of  children: 2   Years of education: Not on file   Highest education level: Not on file  Occupational History   Not on file  Tobacco Use   Smoking status: Former    Types: Cigarettes    Quit date: 08/31/1966    Years since quitting: 55.0   Smokeless tobacco: Never  Vaping Use   Vaping Use: Never used  Substance and Sexual Activity   Alcohol use: No   Drug use: No   Sexual activity: Not on file  Other Topics Concern   Not on file  Social History Narrative   epworth sleepiness scale = 8 (08/31/15)   Social Determinants of Health   Financial Resource Strain: Not on file  Food Insecurity: Not on file  Transportation Needs: Not on file  Physical Activity: Not on file  Stress: Not on file  Social Connections: Not on file     Family History: The patient's family history includes Breast cancer in her daughter; Heart disease in her sister and sister; Kidney failure in her father; Stroke in her mother.  ROS:   Please see the history of present illness.    (+) Severe, lingering cough All other systems reviewed and are negative.  EKGs/Labs/Other Studies Reviewed:    Echo  03/25/2021:  1. Left ventricular ejection fraction, by estimation, is 60 to 65%. The  left ventricle has normal function. The left ventricle has no regional  wall motion abnormalities. There is mild left ventricular hypertrophy.  Left ventricular diastolic parameters  were normal.   2. Right ventricular systolic function is normal. The right ventricular  size is normal.   3. Left atrial size was moderately dilated.   4. ? prominent [employer] and pacing wires in RA/RV Cannot r/o right  sided source of embolus . Right atrial size was mildly dilated.   5. The mitral valve is degenerative. Mild mitral valve regurgitation. No  evidence of mitral stenosis. Moderate mitral annular calcification.   6. Sclerotic and calcified non coronary cusp. The aortic valve is normal  in structure. There is moderate  calcification of the aortic valve. There  is moderate thickening of the aortic valve. Aortic valve regurgitation is  not visualized. Mild to moderate  aortic valve stenosis.   7. The inferior vena cava is normal in size with greater than 50%  respiratory variability, suggesting right atrial pressure of 3 mmHg.   Bilateral LE Venous Doppler 03/25/2021: Summary:  BILATERAL:  -No evidence of popliteal cyst, bilaterally.  RIGHT:  - There is no evidence of deep vein thrombosis in the  lower extremity.  However, portions of this examination were limited- see technologist  comments above.     LEFT:  - There is no evidence of deep vein thrombosis in the lower extremity.  However, portions of this examination were limited- see technologist  comments above.   CTA Chest  03/24/2021: IMPRESSION: 1. Positive for nonocclusive thrombus within proximal segmental right lower lobe branch. 2. Patchy ground-glass and consolidative opacities within the dependent right upper lobe and bilateral lower lobes, concerning for pneumonia and/or aspiration. 3. Approximately 8 mm pulmonary nodule in the right upper lobe. Non-contrast chest CT at 6-12 months is recommended. If the nodule is stable at time of repeat CT, then future CT at 18-24 months (from today's scan) is considered optional for low-risk patients, but is recommended for high-risk patients. This recommendation follows the consensus statement: Guidelines for Management of Incidental Pulmonary Nodules Detected on CT Images: From the Fleischner Society 2017; Radiology 2017; 284:228-243. 4. Approximately 3.3 x 2.2 cm discrete anterior mediastinal mass, potentially a proteinaceous thymic cyst given intermediate density. If clinically warranted, nonemergent, outpatient MRI of the chest with and without contrast. 5. Cardiomegaly. 6. Enlarged main pulmonary artery, which can be seen with pulmonary arterial hypertension. 7. Aortic Atherosclerosis  (ICD10-I70.0).  ADDENDUM REPORT: 03/24/2021 16:18 ADDENDUM: Impression # 4 was accidentally truncated.  It should read:   Approximately 3.3 x 2.2 cm discrete anterior mediastinal mass, potentially a proteinaceous thymic cyst given intermediate density. If clinically warranted, nonemergent, outpatient MRI of the chest with and without contrast could further evaluate.  Echo 01/30/20 1. Left ventricular ejection fraction, by estimation, is 65 to 70%. The  left ventricle has normal function. The left ventricle has no regional  wall motion abnormalities. There is severe concentric left ventricular  hypertrophy. Left ventricular diastolic   parameters are consistent with Grade III diastolic dysfunction  (restrictive). Elevated left atrial pressure.   2. Right ventricular systolic function is mildly reduced. The right  ventricular size is mildly enlarged. There is severely elevated pulmonary  artery systolic pressure. The estimated right ventricular systolic  pressure is 29.7 mmHg.   3. Left atrial size was severely dilated.   4. Right atrial size was severely dilated.   5. The mitral valve is normal in structure. Mild to moderate mitral valve  regurgitation. No evidence of mitral stenosis. Moderate mitral annular  calcification.   6. Tricuspid valve regurgitation is moderate to severe.   7. The aortic valve is normal in structure. There is severe calcifcation  of the aortic valve. There is severe thickening of the aortic valve.  Aortic valve regurgitation is not visualized. Mild aortic valve stenosis.  Aortic valve mean gradient measures  12.0 mmHg.   8. The inferior vena cava is normal in size with greater than 50%  respiratory variability, suggesting right atrial pressure of 3 mmHg.   Carotid Doppler 01/17/20 Right Carotid: Velocities in the right ICA are consistent with a 1-39%  stenosis. The ECA appears >50% stenosed.   Left Carotid: Velocities in the left ICA are consistent with  a 1-39%  stenosis.   Vertebrals:  Bilateral vertebral arteries demonstrate antegrade flow.  Subclavians: Right subclavian artery flow was disturbed. Normal flow hemodynamics were seen in the left subclavian artery.  Tortuous right subclavian artery.    EKG:  EKG is personally reviewed. 08/20/2021:  A-paced, V-sensed. Rate 60 bpm. LVH with repolarization.  Recent Labs: 03/24/2021: B Natriuretic Peptide 484.3 03/25/2021: Magnesium 2.4 08/12/2021: ALT 11; BUN 34; Creatinine, Ser 1.68; Hemoglobin 12.7;  Platelets 252; Potassium 4.7; Sodium 144; TSH 3.290   Recent Lipid Panel    Component Value Date/Time   CHOL  07/04/2009 0418    81        ATP III CLASSIFICATION:  <200     mg/dL   Desirable  200-239  mg/dL   Borderline High  >=240    mg/dL   High          TRIG 64 07/04/2009 0418   HDL 18 (L) 07/04/2009 0418   CHOLHDL 4.5 07/04/2009 0418   VLDL 13 07/04/2009 0418   LDLCALC  07/04/2009 0418    50        Total Cholesterol/HDL:CHD Risk Coronary Heart Disease Risk Table                     Men   Women  1/2 Average Risk   3.4   3.3  Average Risk       5.0   4.4  2 X Average Risk   9.6   7.1  3 X Average Risk  23.4   11.0        Use the calculated Patient Ratio above and the CHD Risk Table to determine the patient's CHD Risk.        ATP III CLASSIFICATION (LDL):  <100     mg/dL   Optimal  100-129  mg/dL   Near or Above                    Optimal  130-159  mg/dL   Borderline  160-189  mg/dL   High  >190     mg/dL   Very High    Physical Exam:      Wt Readings from Last 3 Encounters:  08/20/21 125 lb (56.7 kg)  08/12/21 125 lb 9.6 oz (57 kg)  06/20/21 123 lb (55.8 kg)    VS:  BP 134/60   Pulse 60   Ht 5' 5.5" (1.664 m)   Wt 125 lb (56.7 kg)   BMI 20.48 kg/m  , BMI Body mass index is 20.48 kg/m. GENERAL:  Well appearing HEENT: Pupils equal round and reactive, fundi not visualized, oral mucosa unremarkable NECK:  No jugular venous distention, waveform within normal  limits, carotid upstroke brisk and symmetric, no carotid bruits LUNGS:  Clear to auscultation bilaterally HEART:  RRR.  PMI not displaced or sustained,S1 and S2 within normal limits, no S3, no S4, no clicks, no rubs, III/VI systolic murmur at the RUSB ABD:  Flat, positive bowel sounds normal in frequency in pitch, no bruits, no rebound, no guarding, no midline pulsatile mass, no hepatomegaly, no splenomegaly EXT:  2 plus pulses throughout, no edema, no cyanosis no clubbing SKIN:  No rashes no nodules NEURO:  Cranial nerves II through XII grossly intact, motor grossly intact throughout PSYCH:  Cognitively intact, oriented to person place and time   ASSESSMENT:    1. Nonrheumatic aortic valve stenosis   2. Essential hypertension   3. Persistent atrial fibrillation ([place])   4. Aortic valve stenosis, etiology of cardiac valve disease unspecified     PLAN:    Essential hypertension BP remains labile.  It was controlled in the office today.  At home it has ranged from 99-153/56-74.  She will increase carvedilol to '25mg'$  bid.  Continue clonidine.  We are unable to add thiazide diuretics, ACE inhibitors/or ARB's due to her renal dysfunction with CKD 4.  We are also unable to  add any of the other medicine she has had intolerances to.  She will keep checking her blood pressures and bring to follow-up.  Persistent atrial fibrillation ([place]) She is maintaining sinus rhythm on amiodarone.  Continue carvedilol, digoxin, and Eliquis.  Her Eliquis dose was increased due to PE on the 2.5 mg dose.    Disposition:  FU with APP in 1-2 months. FU with Udell Mazzocco C. Oval Linsey, MD, [facility] in March 2024 following repeat Echo.  Medication Adjustments/Labs and Tests Ordered: Current medicines are reviewed at length with the patient today.  Concerns regarding medicines are outlined above.   Orders Placed This Encounter  Procedures   EKG 12-Lead   ECHOCARDIOGRAM COMPLETE   Meds ordered this encounter   Medications   carvedilol (COREG) 25 MG tablet    Sig: Take 1 tablet (25 mg total) by mouth 2 (two) times daily with a meal.    Dispense:  180 tablet    Refill:  3    D/C PREVIOUS RX, NEW DOSE   Patient Instructions  Medication Instructions:  INCREASE- Carvedilol 25 mg by mouth twice a day  *If you need a refill on your cardiac medications before your next appointment, please call your pharmacy*   Lab Work: None Ordered  Testing/Procedures: Your physician has requested that you have an echocardiogram in March 2024. Echocardiography is a painless test that uses sound waves to create images of your heart. It provides your doctor with information about the size and shape of your heart and how well your heart's chambers and valves are working. This procedure takes approximately one hour. There are no restrictions for this procedure.   Follow-Up: At [facility], you and your health needs are our priority.  As part of our continuing mission to provide you with exceptional heart care, we have created designated Provider Care Teams.  These Care Teams include your primary Cardiologist (physician) and Advanced Practice Providers (APPs -  Physician Assistants and Nurse Practitioners) who all work together to provide you with the care you need, when you need it.  We recommend signing up for the patient portal called "MyChart".  Sign up information is provided on this After Visit Summary.  MyChart is used to connect with patients for Virtual Visits (Telemedicine).  Patients are able to view lab/test results, encounter notes, upcoming appointments, etc.  Non-urgent messages can be sent to your provider as well.   To learn more about what you can do with MyChart, go to [email or web address].    Your next appointment:   1-2 month(s)  The format for your next appointment:   In Person  Provider:   Laurann Montana, NP    Other Instructions March 2024 with Dr Oval Linsey after  Echo          [facility],acting as a scribe for Skeet Latch, MD.,have documented all relevant documentation on the behalf of Skeet Latch, MD,as directed by  Skeet Latch, MD while in the presence of Skeet Latch, MD.  I, [place] Oval Linsey, MD have reviewed all documentation for this visit.  The documentation of the exam, diagnosis, procedures, and orders on 08/20/2021 are all accurate and complete.  Signed, Skeet Latch, MD  08/20/2021 3:56 PM    [place]

## 2021-08-23 DIAGNOSIS — R338 Other retention of urine: Secondary | ICD-10-CM | POA: Diagnosis not present

## 2021-08-23 DIAGNOSIS — N319 Neuromuscular dysfunction of bladder, unspecified: Secondary | ICD-10-CM | POA: Diagnosis not present

## 2021-08-30 ENCOUNTER — Other Ambulatory Visit: Payer: Self-pay | Admitting: Family Medicine

## 2021-08-30 ENCOUNTER — Ambulatory Visit
Admission: RE | Admit: 2021-08-30 | Discharge: 2021-08-30 | Disposition: A | Discharge status: Home / Self Care | Payer: Medicare Other | Source: Ambulatory Visit | Attending: Family Medicine | Admitting: Family Medicine

## 2021-08-30 ENCOUNTER — Other Ambulatory Visit: Payer: Self-pay | Admitting: *Deleted

## 2021-08-30 DIAGNOSIS — R058 Other specified cough: Secondary | ICD-10-CM

## 2021-08-30 DIAGNOSIS — R059 Cough, unspecified: Secondary | ICD-10-CM | POA: Diagnosis not present

## 2021-09-04 ENCOUNTER — Telehealth (HOSPITAL_BASED_OUTPATIENT_CLINIC_OR_DEPARTMENT_OTHER): Payer: Self-pay | Admitting: Cardiovascular Disease

## 2021-09-04 MED ORDER — APIXABAN 5 MG PO TABS: 5.0000 mg | ORAL_TABLET | Freq: Two times a day (BID) | ORAL | 11 refills | Status: DC

## 2021-09-04 NOTE — Telephone Encounter (Signed)
Rx(s) sent to pharmacy electronically.  

## 2021-09-04 NOTE — Telephone Encounter (Signed)
Pt c/o medication issue:  1. Name of Medication:   apixaban (ELIQUIS) 5 MG TABS tablet  2. How are you currently taking this medication (dosage and times per day)?  As prescribed  3. Are you having a reaction (difficulty breathing--STAT)?  No  4. What is your medication issue?   3. Do they need a 30 day or 90 day supply?   90 day  Patient stated needs to get a new prescription for this medication sent to [facility], [place].  Patient stated she only has a day or two left of this medication.

## 2021-09-17 ENCOUNTER — Ambulatory Visit (INDEPENDENT_AMBULATORY_CARE_PROVIDER_SITE_OTHER): Payer: Medicare Other | Admitting: Ophthalmology

## 2021-09-17 ENCOUNTER — Encounter (INDEPENDENT_AMBULATORY_CARE_PROVIDER_SITE_OTHER): Payer: Self-pay | Admitting: Ophthalmology

## 2021-09-17 DIAGNOSIS — H353113 Nonexudative age-related macular degeneration, right eye, advanced atrophic without subfoveal involvement: Secondary | ICD-10-CM | POA: Diagnosis not present

## 2021-09-17 DIAGNOSIS — H353124 Nonexudative age-related macular degeneration, left eye, advanced atrophic with subfoveal involvement: Secondary | ICD-10-CM | POA: Diagnosis not present

## 2021-09-17 DIAGNOSIS — H353211 Exudative age-related macular degeneration, right eye, with active choroidal neovascularization: Secondary | ICD-10-CM | POA: Diagnosis not present

## 2021-09-17 MED ORDER — AFLIBERCEPT 2MG/0.05ML IZ SOLN FOR KALEIDOSCOPE
2.0000 mg | INTRAVITREAL | Status: AC | PRN
  Administered 2021-09-17: 2 mg via INTRAVITREAL

## 2021-09-17 NOTE — Progress Notes (Signed)
09/17/2021     CHIEF COMPLAINT Patient presents for  Chief Complaint  Patient presents with   Macular Degeneration      HISTORY OF PRESENT ILLNESS: Allison Norman is a 88 y.o. female who presents to the clinic today for:   HPI   8 week for DILATE OD, EYLEA OCT. Pt stated vision remained stable.  Last edited by Silvestre Moment on 09/17/2021  1:47 PM.      Referring physician: [name], MD [place],  [place] [address]  HISTORICAL INFORMATION:   Selected notes from the MEDICAL RECORD NUMBER       CURRENT MEDICATIONS: Current Outpatient Medications (Ophthalmic Drugs)  Medication Sig   Aflibercept (EYLEA) 2 MG/0.05ML SOLN 2 mg by Intravitreal route every 3 (three) months. Inject in R eye every 11 weeks per Retina specialist for Macular Degenration   No current facility-administered medications for this visit. (Ophthalmic Drugs)   Current Outpatient Medications (Other)  Medication Sig   acetaminophen (TYLENOL) 500 MG tablet Take 1,000 mg by mouth every 6 (six) hours as needed for moderate pain.   amiodarone (PACERONE) 200 MG tablet Take 1/2 (one-half) tablet by mouth once daily   apixaban (ELIQUIS) 5 MG TABS tablet Take 1 tablet (5 mg total) by mouth 2 (two) times daily.   carvedilol (COREG) 25 MG tablet Take 1 tablet (25 mg total) by mouth 2 (two) times daily with a meal.   cloNIDine (CATAPRES) 0.2 MG tablet Take 0.'2mg'$  in the morning, 0.'3mg'$  in the afternoon at 2pm, and 0.'2mg'$  at bedtime.   cloNIDine (CATAPRES) 0.3 MG tablet Take 0.'2mg'$  in the morning, 0.'3mg'$  in the afternoon at 2pm, and 0.'2mg'$  at bedtime.   dextromethorphan (DELSYM) 30 MG/5ML liquid Take 30 mg by mouth daily as needed for cough.   diazepam (VALIUM) 2 MG tablet Take 2 mg by mouth at bedtime as needed for anxiety.   digoxin (LANOXIN) 0.125 MG tablet Take 1 tablet (0.125 mg total) by mouth every other day.   furosemide (LASIX) 40 MG tablet Take 40 mg by mouth daily.   Melatonin 10 MG CAPS  Take 10 mg by mouth at bedtime.   Multiple Vitamins-Minerals (PRESERVISION AREDS 2) CAPS Take 1 capsule by mouth daily.   polyethylene glycol (MIRALAX / GLYCOLAX) 17 g packet Take 17 g by mouth daily.   No current facility-administered medications for this visit. (Other)      REVIEW OF SYSTEMS: ROS   Negative for: Constitutional, Gastrointestinal, Neurological, Skin, Genitourinary, Musculoskeletal, HENT, Endocrine, Cardiovascular, Eyes, Respiratory, Psychiatric, Allergic/Imm, Heme/Lymph Last edited by Silvestre Moment on 09/17/2021  1:47 PM.       ALLERGIES Allergies  Allergen Reactions   Codeine Itching   Penicillins Itching, Rash and Other (See Comments)    Has patient had a PCN reaction causing immediate rash, facial/tongue/throat swelling, SOB or lightheadedness with hypotension: Yes Has patient had a PCN reaction causing severe rash involving mucus membranes or skin necrosis: No Has patient had a PCN reaction that required hospitalization: No Has patient had a PCN reaction occurring within the last 10 years: No If all of the above answers are "NO", then may proceed with Cephalosporin use.   Doxazosin     Heavy feeling in chest, congestion, wheezing   Doxycycline Other (See Comments)    Cause chest tightness   Hydralazine     Felt bad and could hear pulse in head     PAST MEDICAL HISTORY Past Medical History:  Diagnosis Date  Aortic stenosis 08/20/2021   Mild-moderate.  Repeat echo in one year.   Atrial fibrillation ([place]) 2017   a. s/p DCCV in 10/2015  b. recurrent in 08/2016 --> rate-control pursued.    Cancer ([place])    Breast   CKD (chronic kidney disease)    GCA (giant cell arteritis) (HCC)    Hordeolum internum left lower eyelid 06/28/2019   Hypertension    Macular degeneration    Osteoporosis    PMR (polymyalgia rheumatica) (HCC)    Pulmonary hypertension, unspecified ([place]) 02/14/2021   Resistant hypertension 03/15/2019   Shortness of breath 03/15/2019   Skin cancer     Past Surgical History:  Procedure Laterality Date   CARDIOVERSION N/A 10/18/2015   Procedure: CARDIOVERSION;  Surgeon: Jerline Pain, MD;  Location: [facility];  Service: Cardiovascular;  Laterality: N/A;   CARDIOVERSION N/A 02/20/2017   Procedure: CARDIOVERSION;  Surgeon: Pixie Casino, MD;  Location: [facility];  Service: Cardiovascular;  Laterality: N/A;   CARDIOVERSION N/A 06/18/2017   Procedure: CARDIOVERSION;  Surgeon: Sanda Klein, MD;  Location: [facility] ENDOSCOPY;  Service: Cardiovascular;  Laterality: N/A;   CARDIOVERSION N/A 12/21/2018   Procedure: CARDIOVERSION;  Surgeon: Thayer Headings, MD;  Location: [facility] ENDOSCOPY;  Service: Cardiovascular;  Laterality: N/A;   IR CATHETER TUBE CHANGE  02/28/2021   KNEE SURGERY  2013   MASTECTOMY  1998   left side   PACEMAKER IMPLANT N/A 07/22/2019   Procedure: PACEMAKER IMPLANT;  Surgeon: Evans Lance, MD;  Location: [facility] CV LAB;  Service: Cardiovascular;  Laterality: N/A;    FAMILY HISTORY Family History  Problem Relation Age of Onset   Stroke Mother    Kidney failure Father        died at 60   Heart disease Sister        died at 30   Heart disease Sister        died at 67   Breast cancer Daughter     SOCIAL HISTORY Social History   Tobacco Use   Smoking status: Former    Types: Cigarettes    Quit date: 08/31/1966    Years since quitting: 55.0   Smokeless tobacco: Never  Vaping Use   Vaping Use: Never used  Substance Use Topics   Alcohol use: No   Drug use: No         OPHTHALMIC EXAM:  Base Eye Exam     Visual Acuity (ETDRS)       Right Left   Dist cc 20/40 -2+1 CF at 2'    Correction: Glasses         Tonometry (Tonopen, 1:51 PM)       Right Left   Pressure 11 12         Pupils       Pupils APD   Right PERRL None   Left PERRL None         Visual Fields       Left Right   Restrictions Partial inner superior temporal, inferior temporal, superior nasal, inferior nasal  deficiencies          Extraocular Movement       Right Left    Full Full         Neuro/Psych     Oriented x3: Yes   Mood/Affect: Normal         Dilation     Right eye: 2.5% Phenylephrine, 1.0% Mydriacyl @ 1:51 PM  Slit Lamp and Fundus Exam     External Exam       Right Left   External Normal Normal         Slit Lamp Exam       Right Left   Lids/Lashes Normal 1+ Meibomian gland dysfunction   Conjunctiva/Sclera White and quiet White and quiet   Cornea Clear Clear   Anterior Chamber Deep and quiet Deep and quiet   Iris Round and reactive Round and reactive   Lens Posterior chamber intraocular lens, Open posterior capsule Posterior chamber intraocular lens, Open posterior capsule   Anterior Vitreous Normal Normal         Fundus Exam       Right Left   Posterior Vitreous Normal    Disc Normal    C/D Ratio 0.45    Macula Geographic atrophy approaching nasal FAZ, Advanced age related macular degeneration, Disciform scar nasal to faz, , Drusen, Retinal pigment epithelial mottling    Vessels Normal    Periphery Normal             IMAGING AND PROCEDURES  Imaging and Procedures for 09/17/21  OCT, Retina - OU - Both Eyes       Right Eye Quality was good. Scan locations included subfoveal. Central Foveal Thickness: 309. Progression has improved. Findings include disciform scar, intraretinal fluid, pigment epithelial detachment, subretinal scarring, vitreomacular adhesion .   Left Eye Quality was good. Scan locations included subfoveal. Central Foveal Thickness: 294. Progression has been stable. Findings include no SRF, retinal drusen , subretinal hyper-reflective material, subretinal scarring, central retinal atrophy, outer retinal atrophy.   Notes Pigment epithelial detachment with intraretinal fluid nasal to the fovea OD is stable,, at 8 weeks interval follow-up today,repeat injection Eylea today examination next 11 weeks  OS with  advanced dry atrophic ARMD stable, no change in thickness OS, the computer selected lower baseline      Intravitreal Injection, Pharmacologic Agent - OD - Right Eye       Time Out 09/17/2021. 2:14 PM. Confirmed correct patient, procedure, site, and patient consented.   Anesthesia Topical anesthesia was used. Anesthetic medications included Lidocaine 4%.   Procedure Preparation included 5% betadine to ocular surface, 10% betadine to eyelids, Tobramycin 0.3%, Ofloxacin . A 30 gauge needle was used.   Injection: 2 mg aflibercept 2 MG/0.05ML   Route: Intravitreal, Site: Right Eye   NDC: [id number], Lot: [id number], Expiration date: 10/07/2022, Waste: 0 mL   Post-op Post injection exam found visual acuity of at least counting fingers. The patient tolerated the procedure well. There were no complications. The patient received written and verbal post procedure care education. Post injection medications included ocuflox.              ASSESSMENT/PLAN:  Advanced nonexudative age-related macular degeneration of right eye without subfoveal involvement Geographic atrophy not affecting the FAZ  Advanced nonexudative age-related macular degeneration of left eye with subfoveal involvement OS accounts for acuity  Exudative age-related macular degeneration of right eye with active choroidal neovascularization (HCC) Stabilized at 8 weeks post Eylea.  Repeat injection today reevaluate next 11 weeks     ICD-10-CM   1. Exudative age-related macular degeneration of right eye with active choroidal neovascularization (HCC)  H35.3211 OCT, Retina - OU - Both Eyes    Intravitreal Injection, Pharmacologic Agent - OD - Right Eye    aflibercept (EYLEA) SOLN 2 mg    2. Advanced nonexudative age-related macular degeneration of right eye without subfoveal involvement  H35.3113     3. Advanced nonexudative age-related macular degeneration of left eye with subfoveal involvement  H35.3124       1.   OD monocular patient with intact acuity.  Small area of intraretinal fluid noted some 8 weeks previous improved today repeat examination today with improvement and repeat injection today and reevaluate again in 10-11 weeks  2.  3.  Ophthalmic Meds Ordered this visit:  Meds ordered this encounter  Medications   aflibercept (EYLEA) SOLN 2 mg       Return in about 11 weeks (around 12/03/2021) for dilate, EYLEA OCT, OD.  There are no Patient Instructions on file for this visit.   Explained the diagnoses, plan, and follow up with the patient and they expressed understanding.  Patient expressed understanding of the importance of proper follow up care.   Clent Demark Vera Furniss M.D. Diseases & Surgery of the Retina and Vitreous [facility] [place] 09/17/21     Abbreviations: M myopia (nearsighted); A astigmatism; H hyperopia (farsighted); P presbyopia; Mrx spectacle prescription;  CTL contact lenses; OD right eye; OS left eye; OU both eyes  XT exotropia; ET esotropia; PEK punctate epithelial keratitis; PEE punctate epithelial erosions; DES dry eye syndrome; MGD meibomian gland dysfunction; ATs artificial tears; PFAT's preservative free artificial tears; Bee nuclear sclerotic cataract; PSC posterior subcapsular cataract; ERM epi-retinal membrane; PVD posterior vitreous detachment; RD retinal detachment; DM diabetes mellitus; DR diabetic retinopathy; NPDR non-proliferative diabetic retinopathy; PDR proliferative diabetic retinopathy; CSME clinically significant macular edema; DME diabetic macular edema; dbh dot blot hemorrhages; CWS cotton wool spot; POAG primary open angle glaucoma; C/D cup-to-disc ratio; HVF humphrey visual field; GVF goldmann visual field; OCT optical coherence tomography; IOP intraocular pressure; BRVO Branch retinal vein occlusion; CRVO central retinal vein occlusion; CRAO central retinal artery occlusion; BRAO branch retinal artery occlusion; RT retinal tear; SB scleral  buckle; PPV pars plana vitrectomy; VH Vitreous hemorrhage; PRP panretinal laser photocoagulation; IVK intravitreal kenalog; VMT vitreomacular traction; MH Macular hole;  NVD neovascularization of the disc; NVE neovascularization elsewhere; AREDS age related eye disease study; ARMD age related macular degeneration; POAG primary open angle glaucoma; EBMD epithelial/anterior basement membrane dystrophy; ACIOL anterior chamber intraocular lens; IOL intraocular lens; PCIOL posterior chamber intraocular lens; Phaco/IOL phacoemulsification with intraocular lens placement; [place] photorefractive keratectomy; LASIK laser assisted in situ keratomileusis; HTN hypertension; DM diabetes mellitus; COPD chronic obstructive pulmonary disease

## 2021-09-17 NOTE — Assessment & Plan Note (Signed)
Stabilized at 8 weeks post Eylea.  Repeat injection today reevaluate next 11 weeks

## 2021-09-17 NOTE — Assessment & Plan Note (Signed)
Geographic atrophy not affecting the FAZ

## 2021-09-17 NOTE — Assessment & Plan Note (Signed)
OS accounts for acuity

## 2021-09-19 NOTE — Progress Notes (Unsigned)
Synopsis: Referred for cough by [name], MD  Subjective:   PATIENT ID: Allison Norman GENDER: female DOB: September 07, 1924, MRN: [id number]  No chief complaint on file.  89yF with temporal arteritis, AF, tachy-brady, resistant HTN, PH, PE, AS referred for cough  Otherwise pertinent review of systems is negative.  Past Medical History:  Diagnosis Date   Aortic stenosis 08/20/2021   Mild-moderate.  Repeat echo in one year.   Atrial fibrillation ([place]) 2017   a. s/p DCCV in 10/2015  b. recurrent in 08/2016 --> rate-control pursued.    Cancer (HCC)    Breast   CKD (chronic kidney disease)    GCA (giant cell arteritis) (HCC)    Hordeolum internum left lower eyelid 06/28/2019   Hypertension    Macular degeneration    Osteoporosis    PMR (polymyalgia rheumatica) (HCC)    Pulmonary hypertension, unspecified ([place]) 02/14/2021   Resistant hypertension 03/15/2019   Shortness of breath 03/15/2019   Skin cancer      Family History  Problem Relation Age of Onset   Stroke Mother    Kidney failure Father        died at 79   Heart disease Sister        died at 4   Heart disease Sister        died at 44   Breast cancer Daughter      Past Surgical History:  Procedure Laterality Date   CARDIOVERSION N/A 10/18/2015   Procedure: CARDIOVERSION;  Surgeon: Jerline Pain, MD;  Location: [facility];  Service: Cardiovascular;  Laterality: N/A;   CARDIOVERSION N/A 02/20/2017   Procedure: CARDIOVERSION;  Surgeon: Pixie Casino, MD;  Location: [facility] ENDOSCOPY;  Service: Cardiovascular;  Laterality: N/A;   CARDIOVERSION N/A 06/18/2017   Procedure: CARDIOVERSION;  Surgeon: Sanda Klein, MD;  [facility];  Service: Cardiovascular;  Laterality: N/A;   CARDIOVERSION N/A 12/21/2018   Procedure: CARDIOVERSION;  Surgeon: Thayer Headings, MD;  Location[facility] ENDOSCOPY;  Service: Cardiovascular;  Laterality: N/A;   IR CATHETER TUBE CHANGE  02/28/2021   KNEE SURGERY  2013   MASTECTOMY  1998    left side   PACEMAKER IMPLANT N/A 07/22/2019   Procedure: PACEMAKER IMPLANT;  Surgeon: Evans Lance, MD;  Location: [place] CV LAB;  Service: Cardiovascular;  Laterality: N/A;    Social History   Socioeconomic History   Marital status: Married    Spouse name: Not on file   Number of children: 2   Years of education: Not on file   Highest education level: Not on file  Occupational History   Not on file  Tobacco Use   Smoking status: Former    Types: Cigarettes    Quit date: 08/31/1966    Years since quitting: 55.0   Smokeless tobacco: Never  Vaping Use   Vaping Use: Never used  Substance and Sexual Activity   Alcohol use: No   Drug use: No   Sexual activity: Not on file  Other Topics Concern   Not on file  Social History Narrative   epworth sleepiness scale = 8 (08/31/15)   Social Determinants of Health   Financial Resource Strain: Not on file  Food Insecurity: Not on file  Transportation Needs: Not on file  Physical Activity: Not on file  Stress: Not on file  Social Connections: Not on file  Intimate Partner Violence: Not on file     Allergies  Allergen Reactions   Codeine Itching  Penicillins Itching, Rash and Other (See Comments)    Has patient had a PCN reaction causing immediate rash, facial/tongue/throat swelling, SOB or lightheadedness with hypotension: Yes Has patient had a PCN reaction causing severe rash involving mucus membranes or skin necrosis: No Has patient had a PCN reaction that required hospitalization: No Has patient had a PCN reaction occurring within the last 10 years: No If all of the above answers are "NO", then may proceed with Cephalosporin use.   Doxazosin     Heavy feeling in chest, congestion, wheezing   Doxycycline Other (See Comments)    Cause chest tightness   Hydralazine     Felt bad and could hear pulse in head      Outpatient Medications Prior to Visit  Medication Sig Dispense Refill   acetaminophen (TYLENOL) 500  MG tablet Take 1,000 mg by mouth every 6 (six) hours as needed for moderate pain.     Aflibercept (EYLEA) 2 MG/0.05ML SOLN 2 mg by Intravitreal route every 3 (three) months. Inject in R eye every 11 weeks per Retina specialist for Macular Degenration     amiodarone (PACERONE) 200 MG tablet Take 1/2 (one-half) tablet by mouth once daily 45 tablet 1   apixaban (ELIQUIS) 5 MG TABS tablet Take 1 tablet (5 mg total) by mouth 2 (two) times daily. 60 tablet 11   carvedilol (COREG) 25 MG tablet Take 1 tablet (25 mg total) by mouth 2 (two) times daily with a meal. 180 tablet 3   cloNIDine (CATAPRES) 0.2 MG tablet Take 0.'2mg'$  in the morning, 0.'3mg'$  in the afternoon at 2pm, and 0.'2mg'$  at bedtime. 180 tablet 1   cloNIDine (CATAPRES) 0.3 MG tablet Take 0.'2mg'$  in the morning, 0.'3mg'$  in the afternoon at 2pm, and 0.'2mg'$  at bedtime. 90 tablet 1   dextromethorphan (DELSYM) 30 MG/5ML liquid Take 30 mg by mouth daily as needed for cough.     diazepam (VALIUM) 2 MG tablet Take 2 mg by mouth at bedtime as needed for anxiety.  0   digoxin (LANOXIN) 0.125 MG tablet Take 1 tablet (0.125 mg total) by mouth every other day. 45 tablet 3   furosemide (LASIX) 40 MG tablet Take 40 mg by mouth daily.     Melatonin 10 MG CAPS Take 10 mg by mouth at bedtime.     Multiple Vitamins-Minerals (PRESERVISION AREDS 2) CAPS Take 1 capsule by mouth daily.     polyethylene glycol (MIRALAX / GLYCOLAX) 17 g packet Take 17 g by mouth daily. 14 each 0   No facility-administered medications prior to visit.       Objective:   Physical Exam:  General appearance: 88 y.o., female, NAD, conversant  Eyes: anicteric sclerae; PERRL, tracking appropriately HENT: NCAT; MMM Neck: Trachea midline; no lymphadenopathy, no JVD Lungs: CTAB, no crackles, no wheeze, with normal respiratory effort CV: RRR, no murmur  Abdomen: Soft, non-tender; non-distended, BS present  Extremities: No peripheral edema, warm Skin: Normal turgor and texture; no rash Psych:  Appropriate affect Neuro: Alert and oriented to person and place, no focal deficit     There were no vitals filed for this visit.   on *** LPM *** RA BMI Readings from Last 3 Encounters:  08/20/21 20.48 kg/m  08/12/21 20.58 kg/m  06/20/21 20.16 kg/m   Wt Readings from Last 3 Encounters:  08/20/21 125 lb (56.7 kg)  08/12/21 125 lb 9.6 oz (57 kg)  06/20/21 123 lb (55.8 kg)     CBC    Component Value Date/Time  WBC 7.0 08/12/2021 1548   WBC 8.2 03/28/2021 0423   RBC 4.20 08/12/2021 1548   RBC 3.50 (L) 03/28/2021 0423   HGB 12.7 08/12/2021 1548   HCT 39.1 08/12/2021 1548   PLT 252 08/12/2021 1548   MCV 93 08/12/2021 1548   MCH 30.2 08/12/2021 1548   MCH 30.0 03/28/2021 0423   MCHC 32.5 08/12/2021 1548   MCHC 31.0 03/28/2021 0423   RDW 14.3 08/12/2021 1548   LYMPHSABS 1.2 03/28/2021 0423   LYMPHSABS 1.3 07/05/2019 1048   MONOABS 1.1 (H) 03/28/2021 0423   EOSABS 0.4 03/28/2021 0423   EOSABS 0.3 07/05/2019 1048   BASOSABS 0.0 03/28/2021 0423   BASOSABS 0.0 07/05/2019 1048    ***  Chest Imaging: CTA Chest 03/2021 reviewed by me with RLL segmental PE, ggo/consolidation RUL/bilateral lower lobes, 56m RUL nodule, 3.3 cm anterior mediastinal mass  Pulmonary Functions Testing Results:    Latest Ref Rng & Units 04/21/2017   12:49 PM  PFT Results  FVC-Pre L 1.85   FVC-Predicted Pre % 78   Pre FEV1/FVC % % 79   FEV1-Pre L 1.46   FEV1-Predicted Pre % 83   DLCO uncorrected ml/min/mmHg 15.07   DLCO UNC% % 55   DLVA Predicted % 88   TLC L 5.46   TLC % Predicted % 102   RV % Predicted % 130   PFT 2019 with moderately reduced diffusing capacity    Echocardiogram:   TTE 03/25/21:  1. Left ventricular ejection fraction, by estimation, is 60 to 65%. The  left ventricle has normal function. The left ventricle has no regional  wall motion abnormalities. There is mild left ventricular hypertrophy.  Left ventricular diastolic parameters  were normal.   2. Right  ventricular systolic function is normal. The right ventricular  size is normal.   3. Left atrial size was moderately dilated.   4. ? prominent [employer] and pacing wires in RA/RV Cannot r/o right  sided source of embolus . Right atrial size was mildly dilated.   5. The mitral valve is degenerative. Mild mitral valve regurgitation. No  evidence of mitral stenosis. Moderate mitral annular calcification.   6. Sclerotic and calcified non coronary cusp. The aortic valve is normal  in structure. There is moderate calcification of the aortic valve. There  is moderate thickening of the aortic valve. Aortic valve regurgitation is  not visualized. Mild to moderate  aortic valve stenosis.   7. The inferior vena cava is normal in size with greater than 50%  respiratory variability, suggesting right atrial pressure of 3 mmHg.       Assessment & Plan:    Plan:      NMaryjane Hurter MD [facility] 09/19/2021 5:44 PM

## 2021-09-20 ENCOUNTER — Ambulatory Visit (INDEPENDENT_AMBULATORY_CARE_PROVIDER_SITE_OTHER): Payer: Medicare Other | Admitting: Student

## 2021-09-20 ENCOUNTER — Encounter: Payer: Self-pay | Admitting: Student

## 2021-09-20 VITALS — BP 110/68 | HR 60 | Ht 65.5 in | Wt 124.4 lb

## 2021-09-20 DIAGNOSIS — R053 Chronic cough: Secondary | ICD-10-CM

## 2021-09-20 MED ORDER — PANTOPRAZOLE SODIUM 40 MG PO TBEC: 40.0000 mg | DELAYED_RELEASE_TABLET | Freq: Every day | ORAL | 0 refills | Status: DC

## 2021-09-20 NOTE — Patient Instructions (Addendum)
- Resume protonix 40 mg 30 minutes before dinner  - See lifestyle measures below to work on for throat clearing, cough, laryngopharyngeal reflux - if you'd like to pursue modified barium swallow to see if there are any techniques you could work on to lessen trouble swallowing and potentially help with cough/throat clearing, let me know    You may be a chronic throat clearer! You are not alone! The causes of chronic throat clearing include acid reflux (laryngopharyngeal reflux), allergies, environmental irritants such as tobacco smoke and air pollution, and asthma. If present for a long time throat clearing can become habit forming. When you clear your throat, you are transferring mucus from your throat up into your mouth and nose. We all secrete up to 2 liters (imagine a big Coke bottle) of mucus a day. This saliva is usually swallowed and ends up in the toilet eventually. By clearing the mucus back into your mouth and nose you are sending the saliva in the wrong direction. This is counterproductive. Unless you are walking around spitting all day (which most throat clearers do not do), the mucus will work its way back down to the throat and eventually be swallowed. Get the mucus going in the right direction. Swallow! Swallow! Swallow! No throat clearing.  Chronic throat clearing is damaging. The trauma from the throat clearing can cause redness and swelling of your vocal cords. If the clearing is very excessive small growths (granulomas) can form. These granulomas can get so large that they can eventually affect your breathing. Surgical removal may be necessary. The irritation and swelling produced by the clearing can cause saliva to sit in your throat. This causes more throat clearing. More throat clearing causes more stagnant mucus which causes more throat clearing, which causes more mucus, etc. A vicious cycle will ensue and the habit can be very difficult to break. Without your help and a conscious effort  on your part to break the cycle, the throat clearing will never stop.  Your doctor may prescribe medication and behavioral modifications to treat acid reflux disease. Nose and throat sprays may be prescribed to treat underlying allergies or asthma. Avoiding possible irritants will be recommended. Without changes to your behavior these treatments will not be successful. The following alterations are recommended:  Do not clear your throat. Swallow instead. This gets the mucus going in the right direction towards the toilet.  Carry around some water to assist with swallowing and mucus clearance. When you feel the urge to clear your throat take a sip of the water. If you absolutely need to clear your throat perform a non-traumatic throat clear. To do this pant with your mouth open and say "[place], [place], [place]" with a powerful but very breathy voice. This will clear the secretions without causing damage. Increase your water intake. This will thin secretions and make it easier to swallow. Comply with the behavior recommendations for reflux disease. Chew baking soda ([employer]) gum. This can be found on the internet or in the tooth paste isle of your pharmacy. Gum chewing can help with swallowing, reflux, and throat clearing. Chew three pieces a day. If you develop jaw discomfort or headaches decrease the amount of gum chewing. Tell your friends and family to tell you to swallow when you clear your throat. Some people have been clearing so long that they don't even know when they are doing it. Be patient. The urge to clear your throat will not go away overnight. It may take 8  or 12 weeks for the medication and behavior modifications to work.

## 2021-09-23 ENCOUNTER — Encounter (HOSPITAL_BASED_OUTPATIENT_CLINIC_OR_DEPARTMENT_OTHER): Payer: Self-pay | Admitting: Family

## 2021-09-23 ENCOUNTER — Ambulatory Visit (INDEPENDENT_AMBULATORY_CARE_PROVIDER_SITE_OTHER): Payer: Medicare Other | Admitting: Family

## 2021-09-23 VITALS — BP 122/76 | HR 63 | Ht 65.5 in | Wt 126.1 lb

## 2021-09-23 DIAGNOSIS — D6859 Other primary thrombophilia: Secondary | ICD-10-CM | POA: Diagnosis not present

## 2021-09-23 DIAGNOSIS — I10 Essential (primary) hypertension: Secondary | ICD-10-CM

## 2021-09-23 DIAGNOSIS — I4819 Other persistent atrial fibrillation: Secondary | ICD-10-CM | POA: Diagnosis not present

## 2021-09-23 DIAGNOSIS — Z950 Presence of cardiac pacemaker: Secondary | ICD-10-CM

## 2021-09-23 NOTE — Progress Notes (Unsigned)
Office Visit    Patient Name: Allison Norman Date of Encounter: 09/23/2021  PCP:  [name], [place]  Cardiologist:  Skeet Latch, MD  Advanced Practice Provider:  No care team member to display Electrophysiologist:  Cristopher Peru, MD      Chief Complaint    Allison Norman is a 88 y.o. female presents today for elevated blood pressure   Past Medical History    Past Medical History:  Diagnosis Date   Aortic stenosis 08/20/2021   Mild-moderate.  Repeat echo in one year.   Atrial fibrillation ([place]) 2017   a. s/p DCCV in 10/2015  b. recurrent in 08/2016 --> rate-control pursued.    Cancer ([place])    Breast   CKD (chronic kidney disease)    GCA (giant cell arteritis) (HCC)    Hordeolum internum left lower eyelid 06/28/2019   Hypertension    Macular degeneration    Osteoporosis    PMR (polymyalgia rheumatica) (HCC)    Pulmonary hypertension, unspecified ([place]) 02/14/2021   Resistant hypertension 03/15/2019   Shortness of breath 03/15/2019   Skin cancer    Past Surgical History:  Procedure Laterality Date   CARDIOVERSION N/A 10/18/2015   Procedure: CARDIOVERSION;  Surgeon: Jerline Pain, MD;  Location: [place];  Service: Cardiovascular;  Laterality: N/A;   CARDIOVERSION N/A 02/20/2017   Procedure: CARDIOVERSION;  Surgeon: Pixie Casino, MD;  Location: [place];  Service: Cardiovascular;  Laterality: N/A;   CARDIOVERSION N/A 06/18/2017   Procedure: CARDIOVERSION;  Surgeon: Sanda Klein, MD;  Location: [facility] ENDOSCOPY;  Service: Cardiovascular;  Laterality: N/A;   CARDIOVERSION N/A 12/21/2018   Procedure: CARDIOVERSION;  Surgeon: Thayer Headings, MD;  Location: [facility] ENDOSCOPY;  Service: Cardiovascular;  Laterality: N/A;   IR CATHETER TUBE CHANGE  02/28/2021   KNEE SURGERY  2013   MASTECTOMY  1998   left side   PACEMAKER IMPLANT N/A 07/22/2019   Procedure: PACEMAKER IMPLANT;  Surgeon: Evans Lance, MD;  Location: [facility]  CV LAB;  Service: Cardiovascular;  Laterality: N/A;    Allergies  Allergies  Allergen Reactions   Codeine Itching   Penicillins Itching, Rash and Other (See Comments)    Has patient had a PCN reaction causing immediate rash, facial/tongue/throat swelling, SOB or lightheadedness with hypotension: Yes Has patient had a PCN reaction causing severe rash involving mucus membranes or skin necrosis: No Has patient had a PCN reaction that required hospitalization: No Has patient had a PCN reaction occurring within the last 10 years: No If all of the above answers are "NO", then may proceed with Cephalosporin use.   Doxazosin     Heavy feeling in chest, congestion, wheezing   Doxycycline Other (See Comments)    Cause chest tightness   Hydralazine     Felt bad and could hear pulse in head     History of Present Illness    Allison Norman is a 88 y.o. female with a hx of persistent atrial fibrillation, HTN, CKD IV, PPM, pulmonary hypertension, diastolic dysfunction, mild aortic stenosis, moderate to severe TR last seen 08/20/21.   Initially established with advanced hypertension clinic 02/2019.  She had been diagnosed with hypertension several years prior.  Prior to developing atrial fibrillation BP was better controlled.  Medications were adjusted for improved rate control.  Medic occasion intolerance as detailed below.  Carotid Doppler 01/2020 bilateral 1 to 39% stenosis.  Echo 01/2020 LVEF 65 to 70%, severe  LVH, grade 3 diastolic dysfunction, severe biatrial enlargement, moderate to severe TR, mild aortic stenosis PASP 65 mmHg.  She at that time declined further evaluation of severe pulmonary hypertension and valvular heart disease.  Last seen 02/14/2021 Dr. Oval Linsey.  She noted some dry mouth with clonidine but was willing to continue.  Blood pressures reported elevated at home but well controlled in clinic.  She was recommended to monitor at home and contact office with results.  Her current doses of  carvedilol, clonidine, Lasix were continued.  Admitted 3/19-3/23/23 after presenting with hypoxia found to have pulmonary emboli.  Echo with LVEF 60%.  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  Recommended for outpatient MRI.  She did have community-acquired pneumonia noted by CT and treated with Rocephin and azithromycin with discharged on Omnicef and azithromycin.  She presents today for follow up with her caregiver for follow up after an elevated blood pressure this morning 2 8/105 prior to taking medications.  The night prior 121/92.  Of note blood pressure 06/10/2021 133/68.  Reports no recent hypotensive blood pressure readings at home.  She notes that her blood pressure seems to be elevated in the evenings.  She has been self increasing her clonidine to 0.3 mg in the afternoon and sometimes in the evening.  Her current medication schedule listed below. Reports no shortness of breath nor dyspnea on exertion. Reports no chest pain, pressure, or tightness. No edema, orthopnea, PND. Reports no palpitations.  Mild cough from prior cold but this is overall improving and she has been evaluated by her primary care provider.***  Last seen 08/20/2021 noting persistent labile blood pressure.  Carvedilol increased to 25 mg twice daily.  Clonidine continued.  Unable to use thiazide diuretic, ACE, ARB due to CKD 4.  Saw pulmonology 09/20/21 due to persistent cough, throat clearing. Was recommended to resume Protonix '40mg'$  30 minutes prior to dinner. Suspicion for LPR/irritable larynx. Feels like it is making a difference.   Presents today for follow up independently.   >140/90: 8 Other day just haed a showe with BP 171/55 and then recovered to 146/82.   No lightheadedness, dizziness, chest pain, shortness of breath.   Current medication schedule 9AM carvedilol, clonidine, furosemide 2PM clonidine 6PM carvedilol 10PM clonidine  Previous  antihypertensives: Amlodipine 10 mg (edema, tolerates 5 mg dose) Doxazosin (shortness of breath) Hydralazine (intolerance not specified) High-dose carvedilol (pulsing sensation in ears) Diltiazem (lower extremity edema)   EKGs/Labs/Other Studies Reviewed:   The following studies were reviewed today:  EKG:  No EKG today.  Recent Labs: 03/24/2021: B Natriuretic Peptide 484.3 03/25/2021: Magnesium 2.4 08/12/2021: ALT 11; BUN 34; Creatinine, Ser 1.68; Hemoglobin 12.7; Platelets 252; Potassium 4.7; Sodium 144; TSH 3.290  Recent Lipid Panel    Component Value Date/Time   CHOL  07/04/2009 0418    81        ATP III CLASSIFICATION:  <200     mg/dL   Desirable  200-239  mg/dL   Borderline High  >=240    mg/dL   High          TRIG 64 07/04/2009 0418   HDL 18 (L) 07/04/2009 0418   CHOLHDL 4.5 07/04/2009 0418   VLDL 13 07/04/2009 0418   LDLCALC  07/04/2009 0418    50        Total Cholesterol/HDL:CHD Risk Coronary Heart Disease Risk Table  Men   Women  1/2 Average Risk   3.4   3.3  Average Risk       5.0   4.4  2 X Average Risk   9.6   7.1  3 X Average Risk  23.4   11.0        Use the calculated Patient Ratio above and the CHD Risk Table to determine the patient's CHD Risk.        ATP III CLASSIFICATION (LDL):  <100     mg/dL   Optimal  100-129  mg/dL   Near or Above                    Optimal  130-159  mg/dL   Borderline  160-189  mg/dL   High  >190     mg/dL   Very High    Risk Assessment/Calculations:   CHA2DS2-VASc Score = 5   This indicates a 7.2% annual risk of stroke. The patient's score is based upon: CHF History: 1 HTN History: 1 Diabetes History: 0 Stroke History: 0 Vascular Disease History: 0 Age Score: 2 Gender Score: 1   Home Medications   Current Meds  Medication Sig   acetaminophen (TYLENOL) 500 MG tablet Take 1,000 mg by mouth every 6 (six) hours as needed for moderate pain.   Aflibercept (EYLEA) 2 MG/0.05ML SOLN 2 mg by  Intravitreal route every 3 (three) months. Inject in R eye every 11 weeks per Retina specialist for Macular Degenration   amiodarone (PACERONE) 200 MG tablet Take 1/2 (one-half) tablet by mouth once daily   apixaban (ELIQUIS) 5 MG TABS tablet Take 1 tablet (5 mg total) by mouth 2 (two) times daily.   carvedilol (COREG) 25 MG tablet Take 1 tablet (25 mg total) by mouth 2 (two) times daily with a meal.   cloNIDine (CATAPRES) 0.2 MG tablet Take 0.'2mg'$  in the morning, 0.'3mg'$  in the afternoon at 2pm, and 0.'2mg'$  at bedtime.   cloNIDine (CATAPRES) 0.3 MG tablet Take 0.'2mg'$  in the morning, 0.'3mg'$  in the afternoon at 2pm, and 0.'2mg'$  at bedtime.   dextromethorphan (DELSYM) 30 MG/5ML liquid Take 30 mg by mouth daily as needed for cough.   diazepam (VALIUM) 2 MG tablet Take 2 mg by mouth at bedtime as needed for anxiety.   digoxin (LANOXIN) 0.125 MG tablet Take 1 tablet (0.125 mg total) by mouth every other day.   furosemide (LASIX) 40 MG tablet Take 40 mg by mouth daily.   Melatonin 10 MG CAPS Take 10 mg by mouth at bedtime.   Multiple Vitamins-Minerals (PRESERVISION AREDS 2) CAPS Take 1 capsule by mouth daily.   pantoprazole (PROTONIX) 40 MG tablet Take 1 tablet (40 mg total) by mouth daily.   polyethylene glycol (MIRALAX / GLYCOLAX) 17 g packet Take 17 g by mouth daily.     Review of Systems      All other systems reviewed and are otherwise negative except as noted above.  Physical Exam    VS:  BP 122/76   Pulse 63   Ht 5' 5.5" (1.664 m)   Wt 126 lb 1.6 oz (57.2 kg)   BMI 20.66 kg/m  , BMI Body mass index is 20.66 kg/m.  Wt Readings from Last 3 Encounters:  09/23/21 126 lb 1.6 oz (57.2 kg)  09/20/21 124 lb 6.4 oz (56.4 kg)  08/20/21 125 lb (56.7 kg)     GEN: Well nourished, well developed, in no acute distress. HEENT: normal. Neck: Supple, no JVD,  carotid bruits, or masses. Cardiac: RRR, no murmurs, rubs, or gallops. No clubbing, cyanosis, edema.  Radials/PT 2+ and equal bilaterally.   Respiratory:  Respirations regular and unlabored, clear to auscultation bilaterally. GI: Soft, nontender, nondistended. MS: No deformity or atrophy. Skin: Warm and dry, no rash. Neuro:  Strength and sensation are intact. Psych: Normal affect.  Assessment & Plan    HTN - Elevated at home this morning 208/105 prior to medications though 140/70 in clinic. Discussed to check BP at least 1-2 hours after medications.  Blood pressure has been labile at home that she did not bring log or her blood pressure cuff.  Encouraged to bring cuff to next office visit.  Continue carvedilol 12.5 mg twice daily-encouraged to take her doses 12 hours apart.  Continue furosemide 40 mg daily.  We will change clonidine to take 0 point 2 in the morning, 0 point 3 in the afternoon, 0 point 2 in the evening.  She has been doing this intermittently at home with some improvement in her blood pressure.  MyChart message in one week to check in.  If BP not at goal add low-dose ARB.  Ultimately a BP goal of less than 140/90 may be reasonable. BP at home 85/52- 122/70  PPM - Follows with EP   Persistent atrial fib - Continue Coreg, Digoxin, amiodarone at present doses.  No signs of toxicity.  CHA2DS2-VASc Score = 5 [CHF History: 1, HTN History: 1, Diabetes History: 0, Stroke History: 0, Vascular Disease History: 0, Age Score: 2, Gender Score: 1].  Therefore, the patient's annual risk of stroke is 7.2 %.  She does qualify for reduced dose Eliquis however given recent PE 03/2021 her dose was increased to 5 mg twice daily.  Denies bleeding complications.  Continue Eliquis '5mg'$  BID due to recent PE. May qualify for reduced dose again in the future given age >52, creat >1.5, and weight <60 kg. Will reach out to pharmacy team for input. Addendum 06/24/21: Should be on '5mg'$  BID for at least 6 months, would defer to hematology for dosing afterwards. Since she had a PE while on Eliquis, they may want her to continue the higher dose  indefinitely  HFpEF / Pulmonary hypertension/ Mild AS / Mild to moderate TR -euvolemic on exam.  Previously declined further work-up for pulmonary hypertension, valvular heart disease.  Continue optimal blood pressure control.  Management detailed above.  Continue present dose Lasix.  CKDIV - Careful titration of diuretic and antihypertensive.     Disposition: Follow up as scheduled with Dr. Oval Linsey  Signed, Loel Dubonnet, NP 09/23/2021, 2:45 PM [place]

## 2021-09-23 NOTE — Patient Instructions (Signed)
Medication Instructions:  Your Physician recommend you continue on your current medication as directed.    *If you need a refill on your cardiac medications before your next appointment, please call your pharmacy*  Testing/Procedures: Echocardiogram as scheduled    Follow-Up: At [facility], you and your health needs are our priority.  As part of our continuing mission to provide you with exceptional heart care, we have created designated Provider Care Teams.  These Care Teams include your primary Cardiologist (physician) and Advanced Practice Providers (APPs -  Physician Assistants and Nurse Practitioners) who all work together to provide you with the care you need, when you need it.  We recommend signing up for the patient portal called "MyChart".  Sign up information is provided on this After Visit Summary.  MyChart is used to connect with patients for Virtual Visits (Telemedicine).  Patients are able to view lab/test results, encounter notes, upcoming appointments, etc.  Non-urgent messages can be sent to your provider as well.   To learn more about what you can do with MyChart, go to [email or web address].    Your next appointment:   Follow up as scheduled in March with Dr. Oval Linsey after Echo.

## 2021-09-26 ENCOUNTER — Encounter (HOSPITAL_BASED_OUTPATIENT_CLINIC_OR_DEPARTMENT_OTHER): Payer: Self-pay | Admitting: Family

## 2021-10-14 ENCOUNTER — Other Ambulatory Visit (HOSPITAL_BASED_OUTPATIENT_CLINIC_OR_DEPARTMENT_OTHER): Payer: Self-pay | Admitting: Family

## 2021-10-14 DIAGNOSIS — Z23 Encounter for immunization: Secondary | ICD-10-CM | POA: Diagnosis not present

## 2021-10-14 DIAGNOSIS — F411 Generalized anxiety disorder: Secondary | ICD-10-CM | POA: Diagnosis not present

## 2021-10-14 DIAGNOSIS — Z6820 Body mass index (BMI) 20.0-20.9, adult: Secondary | ICD-10-CM | POA: Diagnosis not present

## 2021-10-14 DIAGNOSIS — I10 Essential (primary) hypertension: Secondary | ICD-10-CM

## 2021-10-14 NOTE — Telephone Encounter (Signed)
Rx(s) sent to pharmacy electronically.  

## 2021-10-18 ENCOUNTER — Ambulatory Visit (INDEPENDENT_AMBULATORY_CARE_PROVIDER_SITE_OTHER): Payer: Medicare Other

## 2021-10-18 DIAGNOSIS — I495 Sick sinus syndrome: Secondary | ICD-10-CM | POA: Diagnosis not present

## 2021-10-18 LAB — CUP PACEART REMOTE DEVICE CHECK
Battery Remaining Longevity: 84 mo
Battery Remaining Percentage: 100 %
Brady Statistic RA Percent Paced: 100 %
Brady Statistic RV Percent Paced: 3 %
Date Time Interrogation Session: 20231013023600
Implantable Lead Implant Date: 20210716
Implantable Lead Implant Date: 20210716
Implantable Lead Location: 753859
Implantable Lead Location: 753860
Implantable Lead Model: 7840
Implantable Lead Model: 7841
Implantable Lead Serial Number: 1017229
Implantable Lead Serial Number: 1090224
Implantable Pulse Generator Implant Date: 20210716
Lead Channel Impedance Value: 610 Ohm
Lead Channel Impedance Value: 632 Ohm
Lead Channel Pacing Threshold Amplitude: 0.4 V
Lead Channel Pacing Threshold Amplitude: 0.8 V
Lead Channel Pacing Threshold Pulse Width: 0.4 ms
Lead Channel Pacing Threshold Pulse Width: 0.4 ms
Lead Channel Setting Pacing Amplitude: 2 V
Lead Channel Setting Pacing Amplitude: 2.5 V
Lead Channel Setting Pacing Pulse Width: 0.4 ms
Lead Channel Setting Sensing Sensitivity: 2.5 mV
Pulse Gen Serial Number: 547553

## 2021-10-23 NOTE — Progress Notes (Signed)
Remote pacemaker transmission.   

## 2021-11-12 ENCOUNTER — Other Ambulatory Visit: Payer: Self-pay | Admitting: Internal Medicine

## 2021-11-15 ENCOUNTER — Other Ambulatory Visit (HOSPITAL_COMMUNITY): Payer: Self-pay | Admitting: Internal Medicine

## 2021-11-20 NOTE — Progress Notes (Unsigned)
Synopsis: Referred for cough by [name], MD  Subjective:   PATIENT ID: Allison Norman: female DOB: August 27, 1924, MRN: [id number]  No chief complaint on file.  88yF with temporal arteritis, AF, tachy-brady, resistant HTN, PH, PE, AS referred for cough  Coughing up clear stuff since May. Never has had covid-19 infection. She did have some heartburn in past/reflux but she has stopped taking those pills over last 4 months. She does have some sinus congestion/postnasal drainage and was prescribed a spray - she thinks flonase but she doesn't think it was very helpful. She notices this especially after eating. Frequently feels sensation of having a lot of phlegm in her throat. She has not tried inhaler for this.   No DOE relative to peers.   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 No MJ, vaping. She has a cat.   Interval HPI:  Resumed ppi last visit   Otherwise pertinent review of systems is negative.   Past Medical History:  Diagnosis Date   Aortic stenosis 08/20/2021   Mild-moderate.  Repeat echo in one year.   Atrial fibrillation ([place]) 2017   a. s/p DCCV in 10/2015  b. recurrent in 08/2016 --> rate-control pursued.    Cancer (HCC)    Breast   CKD (chronic kidney disease)    GCA (giant cell arteritis) (HCC)    Hordeolum internum left lower eyelid 06/28/2019   Hypertension    Macular degeneration    Osteoporosis    PMR (polymyalgia rheumatica) (HCC)    Pulmonary hypertension, unspecified ([place]) 02/14/2021   Resistant hypertension 03/15/2019   Shortness of breath 03/15/2019   Skin cancer      Family History  Problem Relation Age of Onset   Stroke Mother    Kidney failure Father        died at 56   Heart disease Sister        died at 38   Heart disease Sister        died at 9   Breast cancer Daughter      Past Surgical History:  Procedure Laterality Date   CARDIOVERSION N/A 10/18/2015   Procedure:  CARDIOVERSION;  Surgeon: Jerline Pain, MD;  Location: [place];  Service: Cardiovascular;  Laterality: N/A;   CARDIOVERSION N/A 02/20/2017   Procedure: CARDIOVERSION;  Surgeon: Pixie Casino, MD;  Location: [facility] ENDOSCOPY;  Service: Cardiovascular;  Laterality: N/A;   CARDIOVERSION N/A 06/18/2017   Procedure: CARDIOVERSION;  Surgeon: Sanda Klein, MD;  Location: [place] ENDOSCOPY;  Service: Cardiovascular;  Laterality: N/A;   CARDIOVERSION N/A 12/21/2018   Procedure: CARDIOVERSION;  Surgeon: Thayer Headings, MD;  Location: [facility] ENDOSCOPY;  Service: Cardiovascular;  Laterality: N/A;   IR CATHETER TUBE CHANGE  02/28/2021   KNEE SURGERY  2013   MASTECTOMY  1998   left side   PACEMAKER IMPLANT N/A 07/22/2019   Procedure: PACEMAKER IMPLANT;  Surgeon: Evans Lance, MD;  Location: [place] CV LAB;  Service: Cardiovascular;  Laterality: N/A;    Social History   Socioeconomic History   Marital status: Married    Spouse name: Not on file   Number of children: 2   Years of education: Not on file   Highest education level: Not on file  Occupational History   Not on file  Tobacco Use   Smoking status: Former    Types: Cigarettes    Quit date: 08/31/1966  Years since quitting: 55.2   Smokeless tobacco: Never  Vaping Use   Vaping Use: Never used  Substance and Sexual Activity   Alcohol use: No   Drug use: No   Sexual activity: Not on file  Other Topics Concern   Not on file  Social History Narrative   epworth sleepiness scale = 8 (08/31/15)   Social Determinants of Health   Financial Resource Strain: Not on file  Food Insecurity: Not on file  Transportation Needs: Not on file  Physical Activity: Not on file  Stress: Not on file  Social Connections: Not on file  Intimate Partner Violence: Not on file     Allergies  Allergen Reactions   Codeine Itching   Penicillins Itching, Rash and Other (See Comments)    Has patient had a PCN reaction causing immediate rash,  facial/tongue/throat swelling, SOB or lightheadedness with hypotension: Yes Has patient had a PCN reaction causing severe rash involving mucus membranes or skin necrosis: No Has patient had a PCN reaction that required hospitalization: No Has patient had a PCN reaction occurring within the last 10 years: No If all of the above answers are "NO", then may proceed with Cephalosporin use.   Doxazosin     Heavy feeling in chest, congestion, wheezing   Doxycycline Other (See Comments)    Cause chest tightness   Hydralazine     Felt bad and could hear pulse in head      Outpatient Medications Prior to Visit  Medication Sig Dispense Refill   acetaminophen (TYLENOL) 500 MG tablet Take 1,000 mg by mouth every 6 (six) hours as needed for moderate pain.     Aflibercept (EYLEA) 2 MG/0.05ML SOLN 2 mg by Intravitreal route every 3 (three) months. Inject in R eye every 11 weeks per Retina specialist for Macular Degenration     amiodarone (PACERONE) 200 MG tablet Take 1/2 (one-half) tablet by mouth once daily 45 tablet 0   apixaban (ELIQUIS) 5 MG TABS tablet Take 1 tablet (5 mg total) by mouth 2 (two) times daily. 60 tablet 11   carvedilol (COREG) 25 MG tablet Take 1 tablet (25 mg total) by mouth 2 (two) times daily with a meal. 180 tablet 3   cloNIDine (CATAPRES) 0.2 MG tablet Take 0.'2mg'$  in the morning, 0.'3mg'$  in the afternoon at 2pm, and 0.'2mg'$  at bedtime. 180 tablet 1   cloNIDine (CATAPRES) 0.3 MG tablet TAKE 0.'2MG'$  BY MOUTH IN THE MORNING , 0.3 MG IN THE AFTERNOON AND 0.2 MG  AT BEDTIME 90 tablet 1   dextromethorphan (DELSYM) 30 MG/5ML liquid Take 30 mg by mouth daily as needed for cough.     diazepam (VALIUM) 2 MG tablet Take 2 mg by mouth at bedtime as needed for anxiety.  0   digoxin (LANOXIN) 0.125 MG tablet Take 1 tablet (125 mcg total) by mouth every other day. 45 tablet 2   furosemide (LASIX) 40 MG tablet Take 40 mg by mouth daily.     Melatonin 10 MG CAPS Take 10 mg by mouth at bedtime.      Multiple Vitamins-Minerals (PRESERVISION AREDS 2) CAPS Take 1 capsule by mouth daily.     pantoprazole (PROTONIX) 40 MG tablet Take 1 tablet (40 mg total) by mouth daily. 90 tablet 0   polyethylene glycol (MIRALAX / GLYCOLAX) 17 g packet Take 17 g by mouth daily. 14 each 0   No facility-administered medications prior to visit.       Objective:   Physical Exam:  General appearance: 88 y.o., female, NAD, conversant  Eyes: anicteric sclerae; PERRL, tracking appropriately HENT: NCAT; MMM Neck: Trachea midline; no lymphadenopathy, no JVD Lungs: CTAB, no crackles, no wheeze, with normal respiratory effort CV: RRR, no murmur  Abdomen: Soft, non-tender; non-distended, BS present  Extremities: No peripheral edema, warm Skin: Normal turgor and texture; no rash Psych: Appropriate affect Neuro: Alert and oriented to person and place, no focal deficit     There were no vitals filed for this visit.    on RA BMI Readings from Last 3 Encounters:  09/23/21 20.66 kg/m  09/20/21 20.39 kg/m  08/20/21 20.48 kg/m   Wt Readings from Last 3 Encounters:  09/23/21 126 lb 1.6 oz (57.2 kg)  09/20/21 124 lb 6.4 oz (56.4 kg)  08/20/21 125 lb (56.7 kg)     CBC    Component Value Date/Time   WBC 7.0 08/12/2021 1548   WBC 8.2 03/28/2021 0423   RBC 4.20 08/12/2021 1548   RBC 3.50 (L) 03/28/2021 0423   HGB 12.7 08/12/2021 1548   HCT 39.1 08/12/2021 1548   PLT 252 08/12/2021 1548   MCV 93 08/12/2021 1548   MCH 30.2 08/12/2021 1548   MCH 30.0 03/28/2021 0423   MCHC 32.5 08/12/2021 1548   MCHC 31.0 03/28/2021 0423   RDW 14.3 08/12/2021 1548   LYMPHSABS 1.2 03/28/2021 0423   LYMPHSABS 1.3 07/05/2019 1048   MONOABS 1.1 (H) 03/28/2021 0423   EOSABS 0.4 03/28/2021 0423   EOSABS 0.3 07/05/2019 1048   BASOSABS 0.0 03/28/2021 0423   BASOSABS 0.0 07/05/2019 1048    Chest Imaging: CTA Chest 03/2021 reviewed by me with RLL segmental PE, ggo/consolidation RUL/bilateral lower lobes, 53m RUL  nodule, 3.3 cm anterior mediastinal mass  Pulmonary Functions Testing Results:    Latest Ref Rng & Units 04/21/2017   12:49 PM  PFT Results  FVC-Pre L 1.85   FVC-Predicted Pre % 78   Pre FEV1/FVC % % 79   FEV1-Pre L 1.46   FEV1-Predicted Pre % 83   DLCO uncorrected ml/min/mmHg 15.07   DLCO UNC% % 55   DLVA Predicted % 88   TLC L 5.46   TLC % Predicted % 102   RV % Predicted % 130   PFT 2019 with moderately reduced diffusing capacity    Echocardiogram:   TTE 03/25/21:  1. Left ventricular ejection fraction, by estimation, is 60 to 65%. The  left ventricle has normal function. The left ventricle has no regional  wall motion abnormalities. There is mild left ventricular hypertrophy.  Left ventricular diastolic parameters  were normal.   2. Right ventricular systolic function is normal. The right ventricular  size is normal.   3. Left atrial size was moderately dilated.   4. ? prominent Chiari Network and pacing wires in RA/RV Cannot r/o right  sided source of embolus . Right atrial size was mildly dilated.   5. The mitral valve is degenerative. Mild mitral valve regurgitation. No  evidence of mitral stenosis. Moderate mitral annular calcification.   6. Sclerotic and calcified non coronary cusp. The aortic valve is normal  in structure. There is moderate calcification of the aortic valve. There  is moderate thickening of the aortic valve. Aortic valve regurgitation is  not visualized. Mild to moderate  aortic valve stenosis.   7. The inferior vena cava is normal in size with greater than 50%  respiratory variability, suggesting right atrial pressure of 3 mmHg.       Assessment & Plan:   #  chronic cough With frequent throat clearing, association with eating and nocturnal sx, onset around time of discontinuation of ppi strong suspicion for LPR/irritable larynx.   # anterior mediastinal mass  # 79m RUL pulmonary nodule  Plan: - Resume protonix 40 mg 30 minutes before  dinner  - discussed lifestyle measures to work on for throat clearing, cough, laryngopharyngeal reflux - if you'd like to pursue modified barium swallow to see if there are any techniques you could work on to lessen trouble swallowing and potentially help with cough/throat clearing, let me know     NMaryjane Hurter MD [facility] 11/20/2021 12:36 PM

## 2021-11-21 ENCOUNTER — Encounter: Payer: Self-pay | Admitting: Student

## 2021-11-21 ENCOUNTER — Ambulatory Visit (INDEPENDENT_AMBULATORY_CARE_PROVIDER_SITE_OTHER): Payer: Medicare Other | Admitting: Student

## 2021-11-21 VITALS — BP 144/70 | HR 60 | Temp 98.2°F | Ht 65.5 in | Wt 128.0 lb

## 2021-11-21 DIAGNOSIS — R053 Chronic cough: Secondary | ICD-10-CM

## 2021-11-21 DIAGNOSIS — R911 Solitary pulmonary nodule: Secondary | ICD-10-CM

## 2021-11-21 NOTE — Patient Instructions (Signed)
-   would stay on protonix 40 mg daily 30 minutes before dinner - would take shower daily, clear nose of crusting and then flonase 1 spray each nostril every day for at least 3 weeks to see if this improves your cough - if this doesn't improve your cough then would consider starting famotidine (pepcid) 20 mg daily 30 minutes before dinner (in addition to continuing protonix as above) - see you in 8 weeks or sooner if need be!

## 2021-12-03 ENCOUNTER — Encounter (INDEPENDENT_AMBULATORY_CARE_PROVIDER_SITE_OTHER): Payer: Medicare Other | Admitting: Ophthalmology

## 2021-12-10 DIAGNOSIS — H353211 Exudative age-related macular degeneration, right eye, with active choroidal neovascularization: Secondary | ICD-10-CM | POA: Diagnosis not present

## 2021-12-10 DIAGNOSIS — H353124 Nonexudative age-related macular degeneration, left eye, advanced atrophic with subfoveal involvement: Secondary | ICD-10-CM | POA: Diagnosis not present

## 2021-12-10 DIAGNOSIS — H35351 Cystoid macular degeneration, right eye: Secondary | ICD-10-CM | POA: Diagnosis not present

## 2022-01-01 IMAGING — DX DG CHEST 1V PORT
1 series · 1 of 1 positions shown · non-contrast
Comparison: 07/22/2019

CLINICAL DATA: Generalized weakness, fatigue

EXAM:
PORTABLE CHEST 1 VIEW

[chest ap]
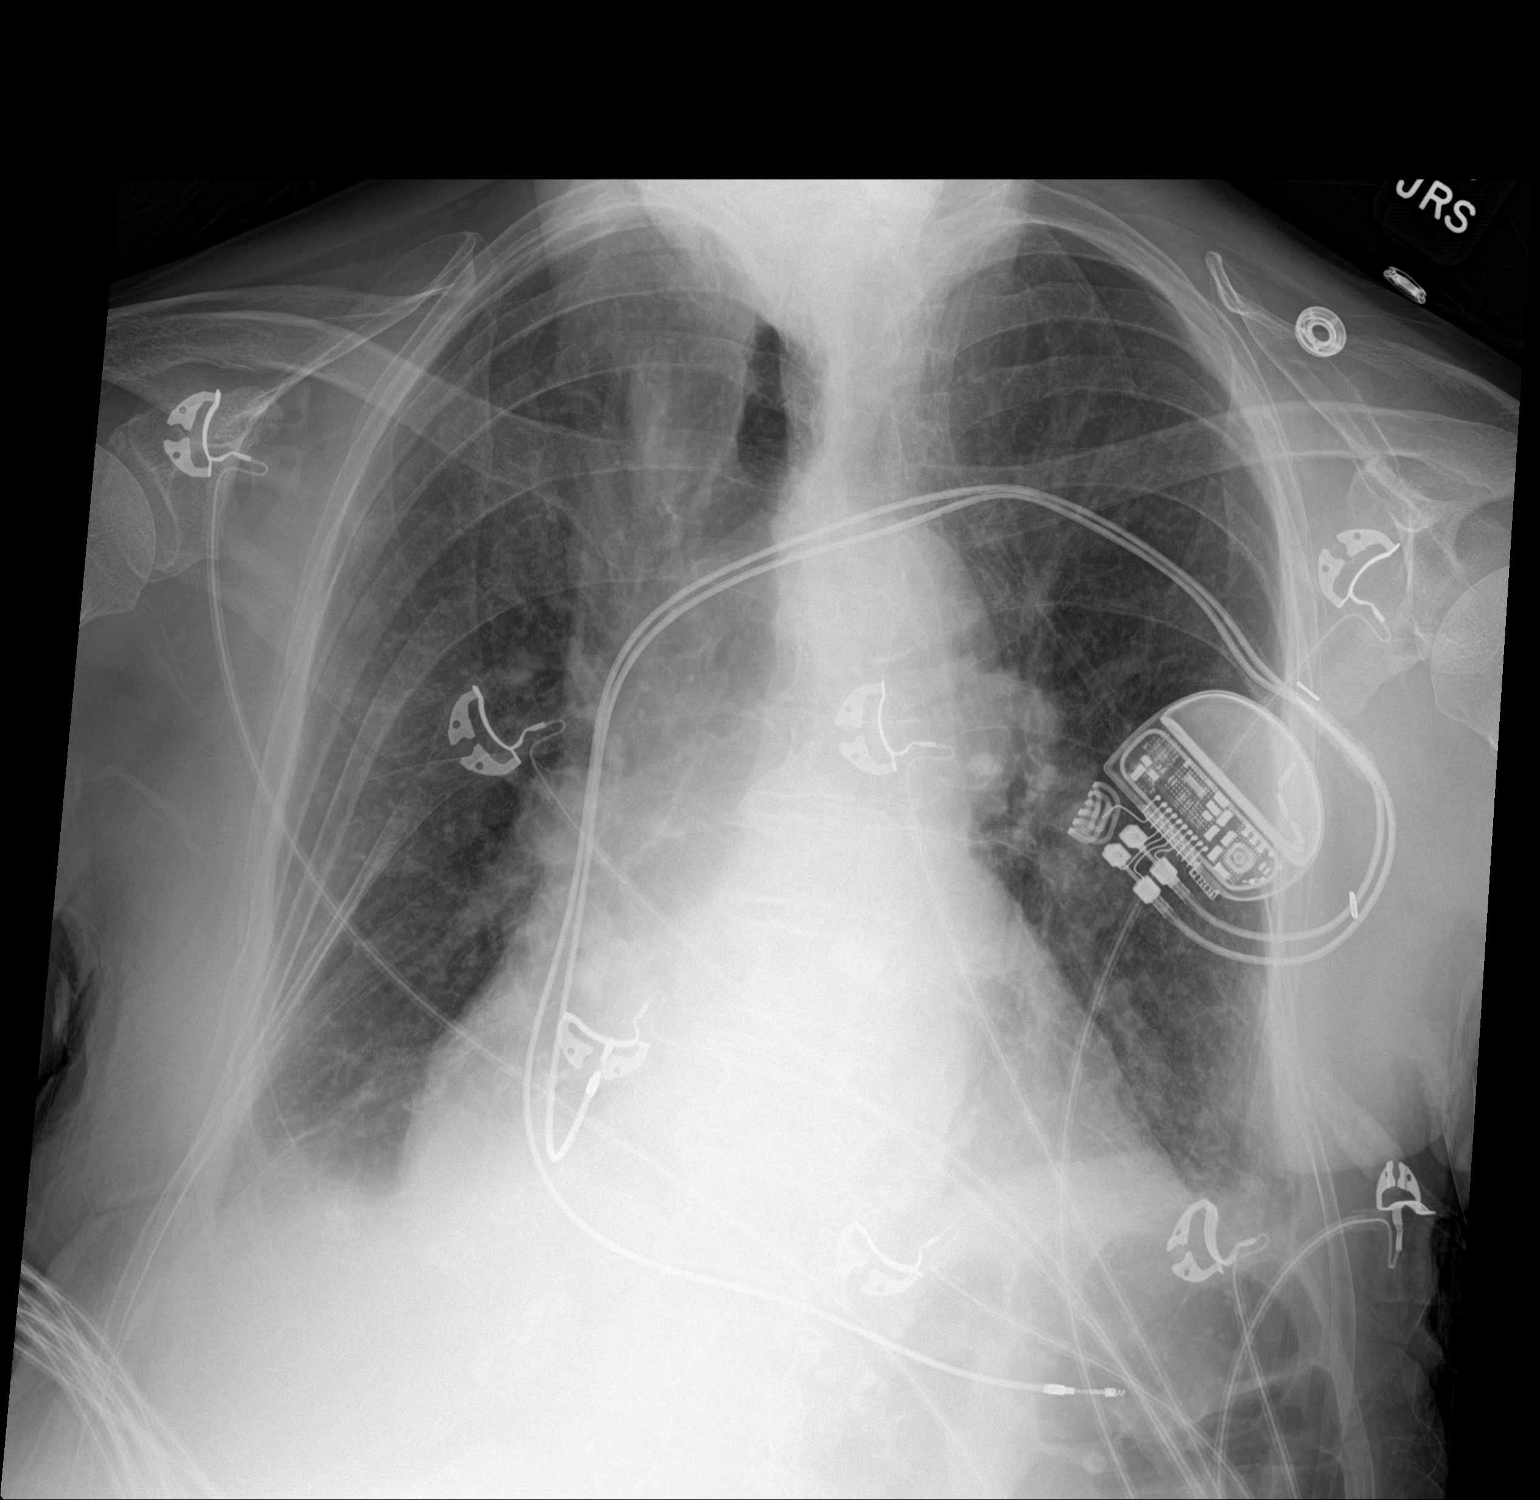

[1 of 1 positions shown; findings below may reference images not displayed]

FINDINGS: Rotated examination. Cardiomegaly with left chest multi lead pacer.
Small, layering bilateral pleural effusions.
IMPRESSION: 1. Small, layering bilateral pleural effusions.
2. Cardiomegaly.

## 2022-01-03 IMAGING — DX DG CHEST 1V PORT
1 series · 1 of 1 positions shown · non-contrast
Comparison: Radiograph 2 days ago 09/05/2020

CLINICAL DATA: Shortness of breath.

EXAM:
PORTABLE CHEST 1 VIEW

[chest ap]
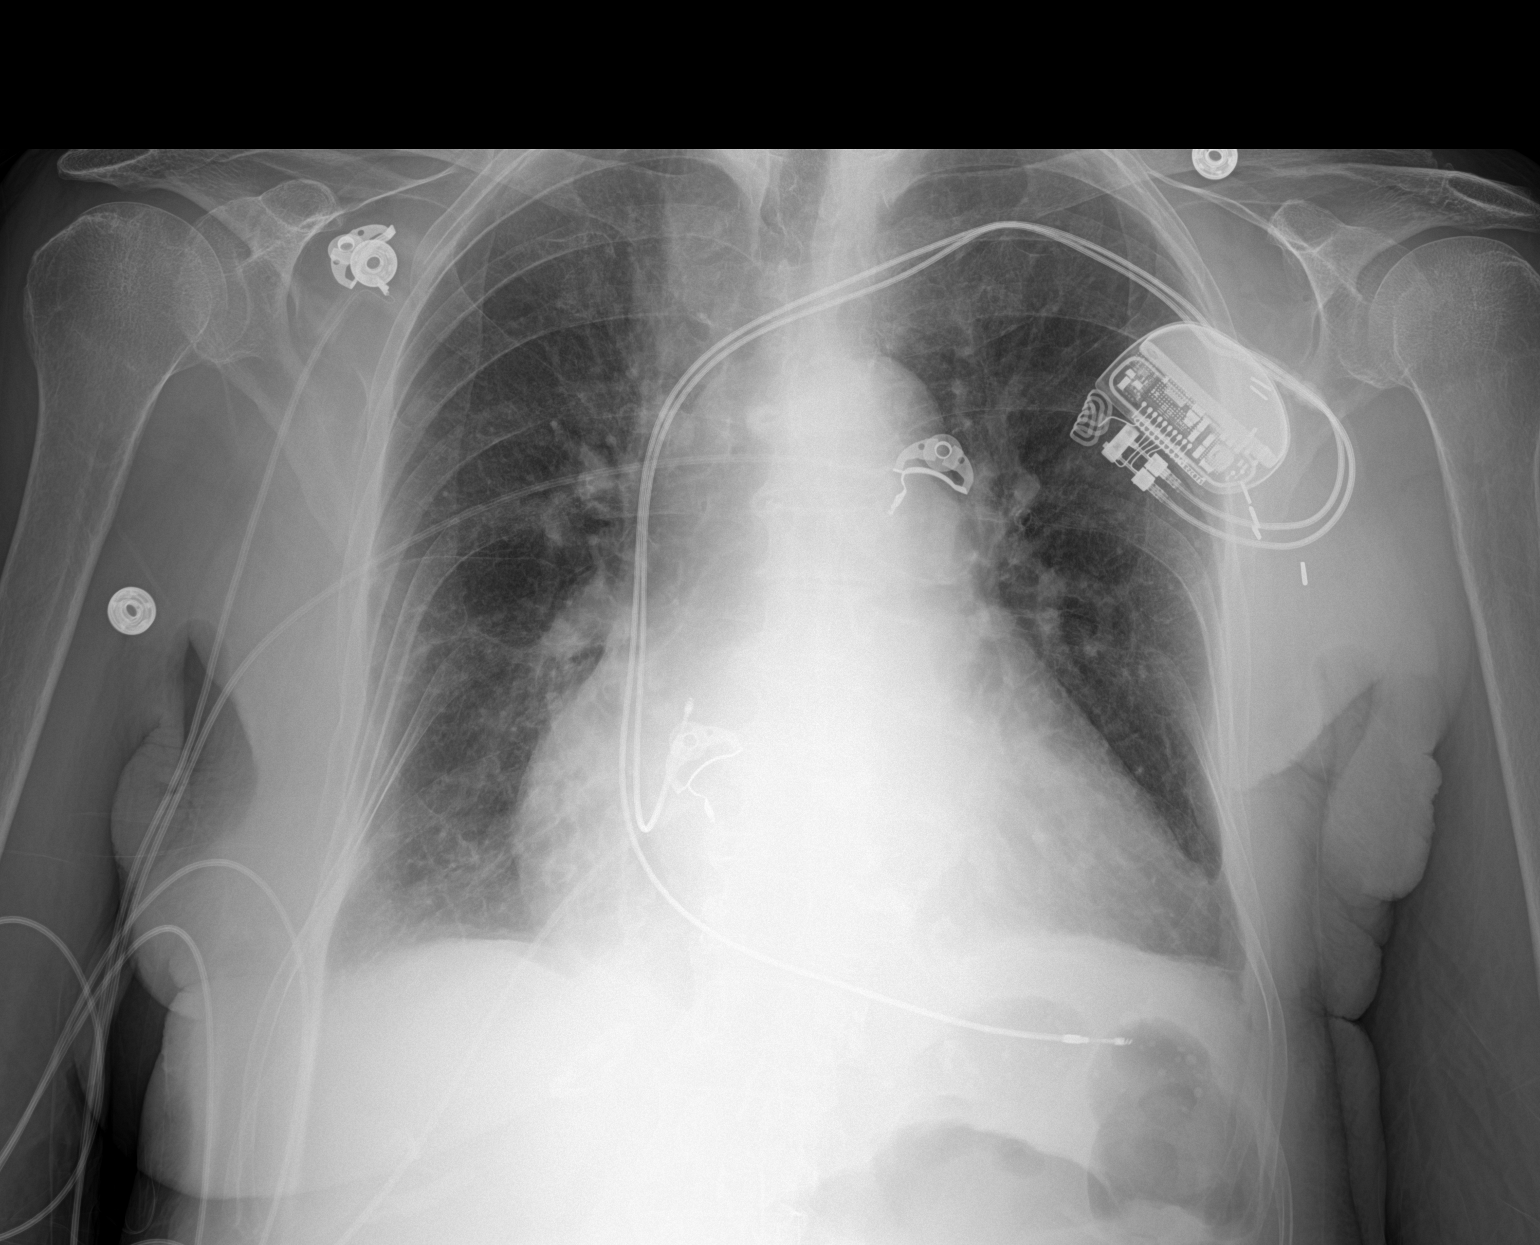

[1 of 1 positions shown; findings below may reference images not displayed]

FINDINGS: Left-sided pacemaker in place. Cardiomegaly is unchanged. Unchanged
mediastinal contours. Small bilateral pleural effusions with
improvement on the right from prior exam. No pulmonary edema. No new
airspace disease. No pneumothorax. Splenic granuloma noted in the
left upper quadrant of the abdomen.
IMPRESSION: Small bilateral pleural effusions with improvement on the right from
prior exam. Stable cardiomegaly.

## 2022-01-03 IMAGING — US US RENAL
1 series · 14 of 25 positions shown · non-contrast
Comparison: CT abdomen pelvis 08/30/2020

CLINICAL DATA: Acute kidney injury

EXAM:
RENAL / URINARY TRACT ULTRASOUND COMPLETE

[Series 1: us renal · 14 of 49 slices shown]
[im 1/49]
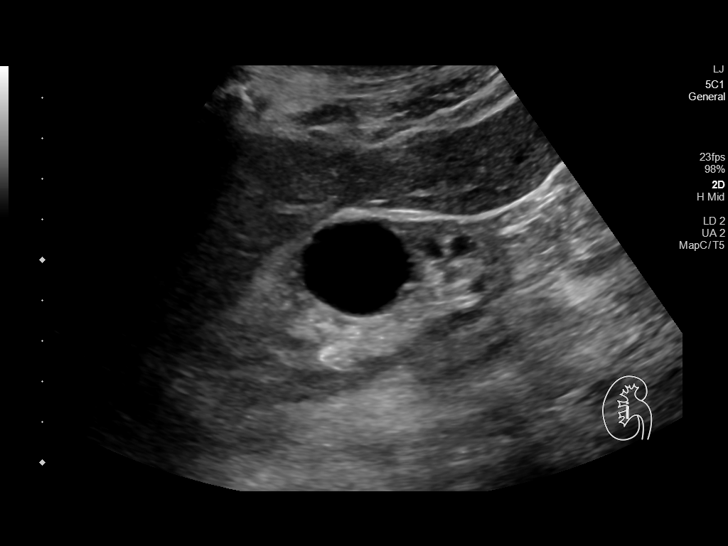
[im 5/49]
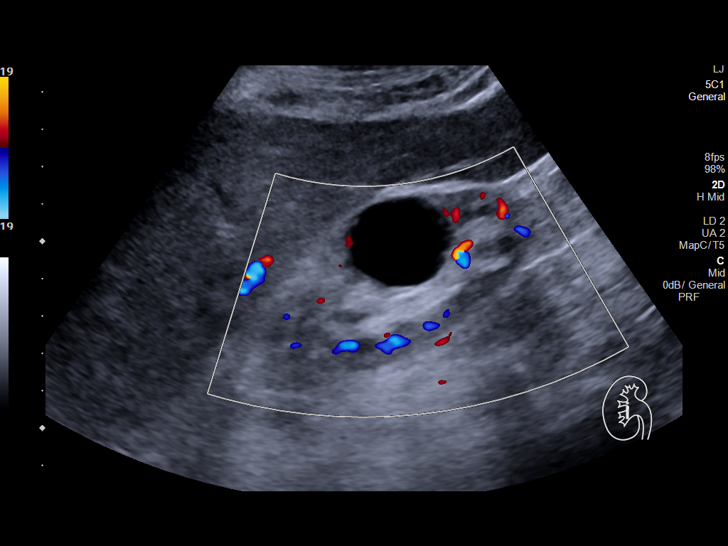
[im 9/49]
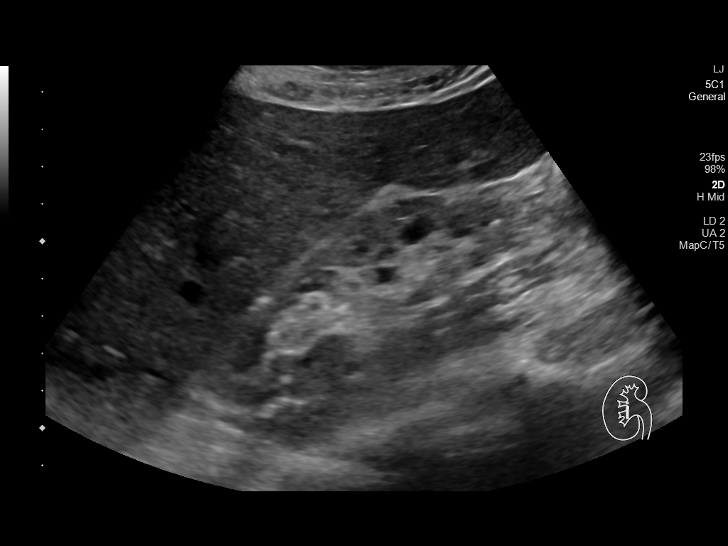
[im 13/49]
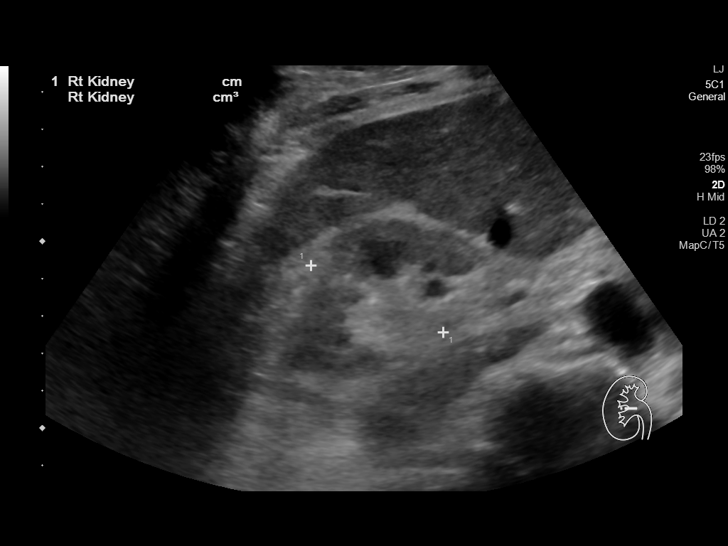
[im 17/49]
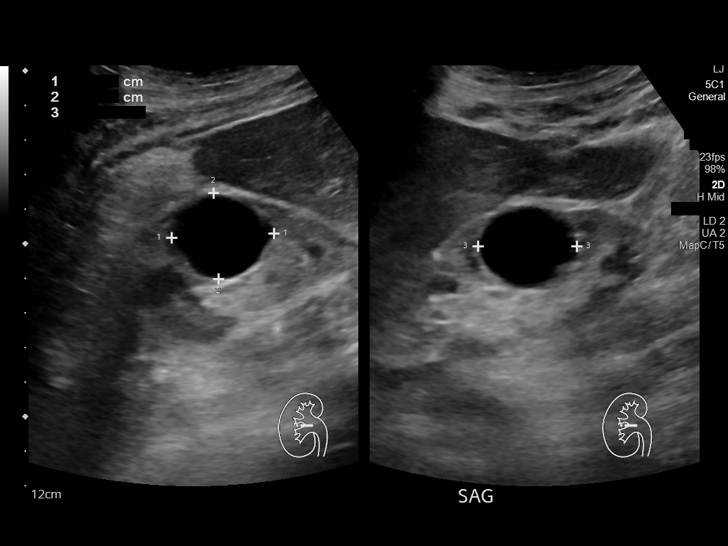
[im 19/49]
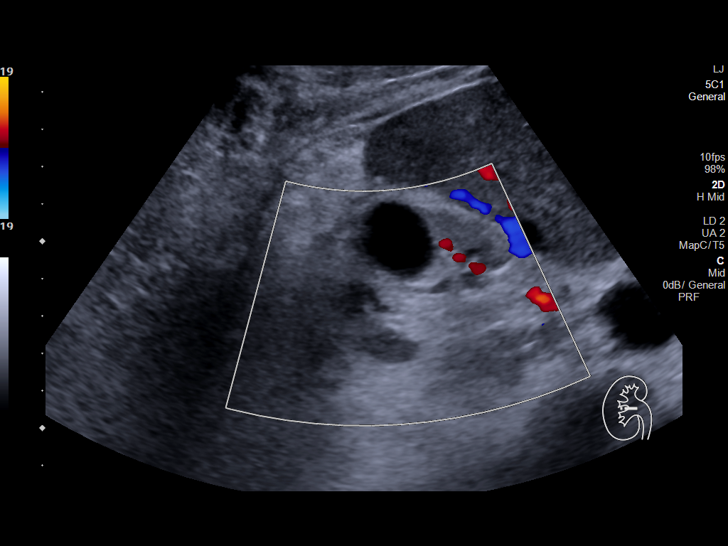
[im 23/49]
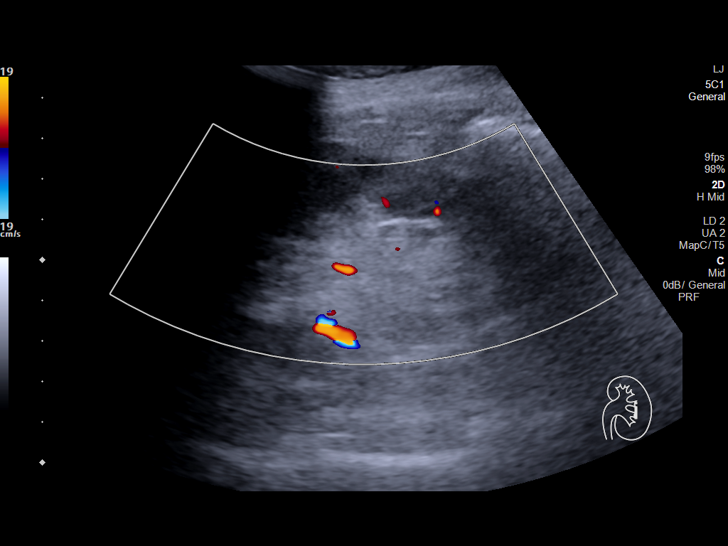
[im 27/49]
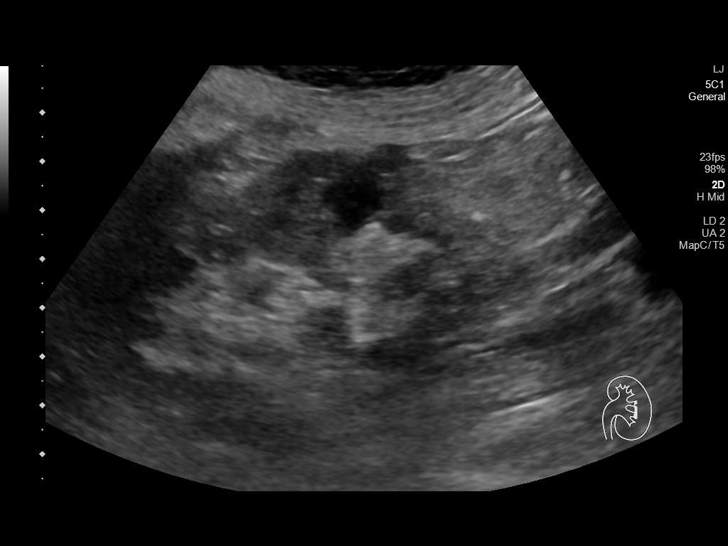
[im 31/49]
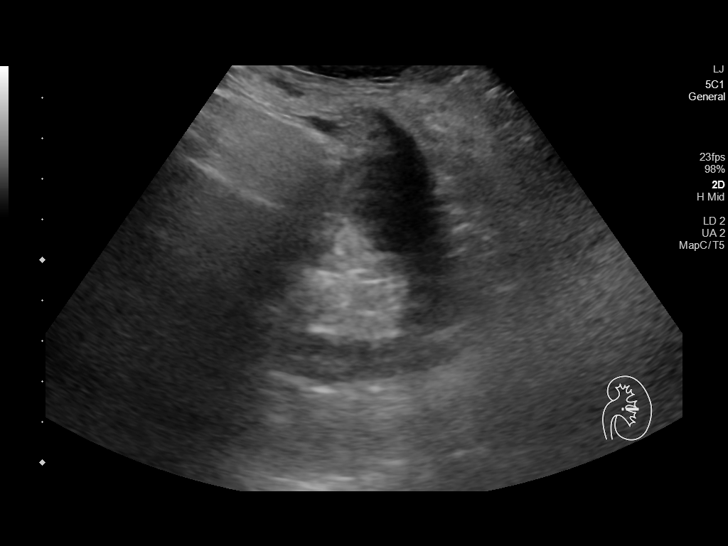
[im 33/49]
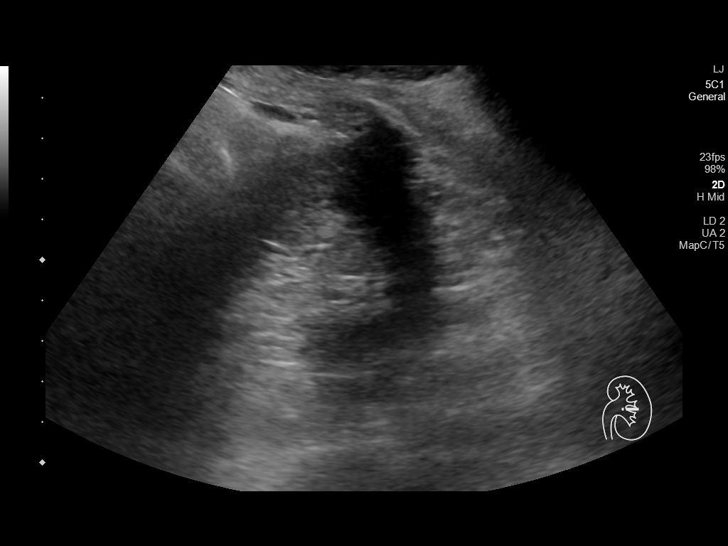
[im 37/49]
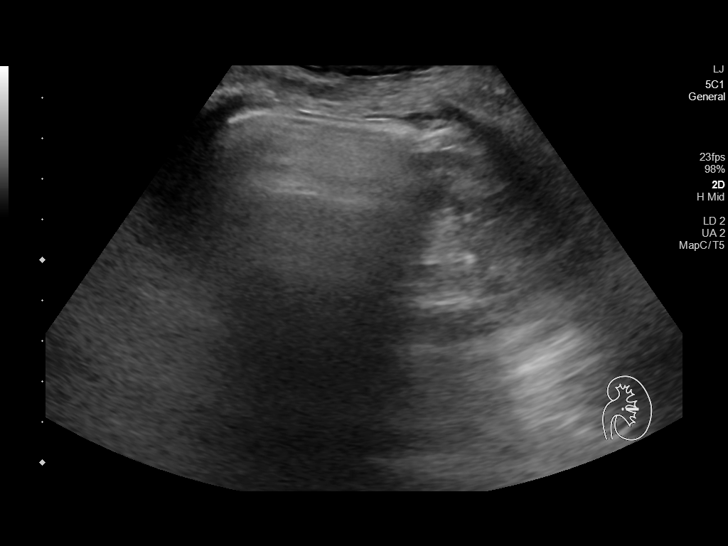
[im 41/49]
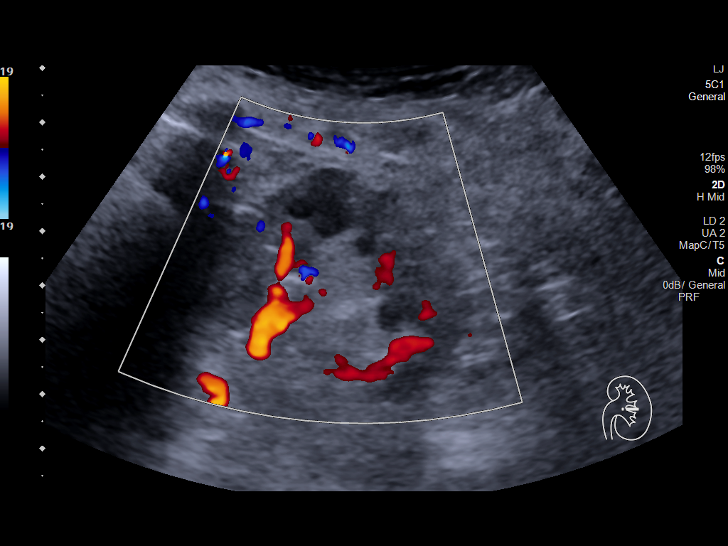
[im 45/49]
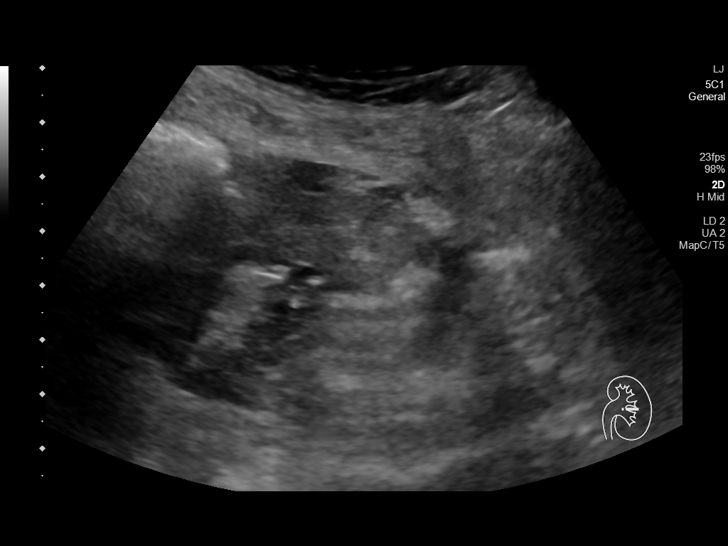
[im 49/49]
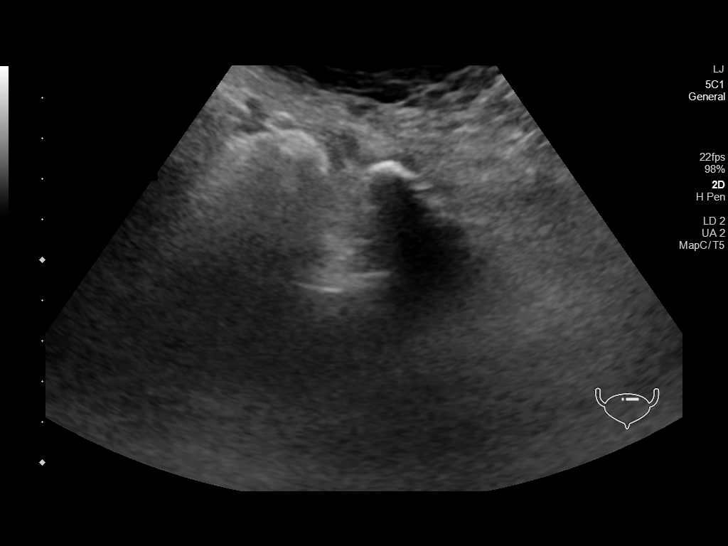

[14 of 25 positions shown; findings below may reference images not displayed]

FINDINGS: Right Kidney:

Renal measurements: 8.9 x 3.8 x 4.0 cm = volume: 70.9 mL. No
hydronephrosis. There is an anechoic, simple appearing cyst which
measures 3.0 x 2.5 x 2.9 cm in the mid kidney. There is an
additional hypoechoic cystic lesion measuring 2.0 x 1.7 x 1.3 cm.

Left Kidney:

Renal measurements: 9.5 x 4.8 x 4.9 cm = volume: 117.3 mL. No
hydronephrosis. There are 2 cystic lesions which measure up to
and 1.0 cm. The cyst in the mid kidney appears anechoic and simple.
The cyst in the lower pole appears hypoechoic.

Bladder:

Decompressed with Foley catheter in place.

Other:

None.
IMPRESSION: No hydronephrosis.

Multiple bilateral renal cysts which are likely simple.

## 2022-01-17 ENCOUNTER — Ambulatory Visit (INDEPENDENT_AMBULATORY_CARE_PROVIDER_SITE_OTHER): Payer: Medicare Other

## 2022-01-17 DIAGNOSIS — I495 Sick sinus syndrome: Secondary | ICD-10-CM

## 2022-01-18 NOTE — Progress Notes (Signed)
Synopsis: Referred for cough by [name], MD  Subjective:   PATIENT ID: Allison Norman GENDER: female DOB: [birth year], MRN: [id number]  Chief Complaint  Patient presents with   Follow-up    Cough is better some days- has more trouble in the night, wakes her up sometimes. Her cough is usually prod with white to clear sputum.    89yF with temporal arteritis, AF, tachy-brady, resistant HTN, PH, PE, AS referred for cough  Coughing up clear stuff since May. Never has had covid-19 infection. She did have some heartburn in past/reflux but she has stopped taking those pills over last 4 months. She does have some sinus congestion/postnasal drainage and was prescribed a spray - she thinks flonase but she doesn't think it was very helpful. She notices this especially after eating. Frequently feels sensation of having a lot of phlegm in her throat. She has not tried inhaler for this.   No DOE relative to peers.   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 No MJ, vaping. She has a cat.   Interval HPI:  Due for surveillance CT Chest we can order today  Added flonase/nasal irrigation last visit, continued ppi, MBS deferred.  Cough is mostly at night.   Otherwise pertinent review of systems is negative.   Past Medical History:  Diagnosis Date   Aortic stenosis 08/20/2021   Mild-moderate.  Repeat echo in one year.   Atrial fibrillation ([place]) 2017   a. s/p DCCV in 10/2015  b. recurrent in 08/2016 --> rate-control pursued.    Cancer (HCC)    Breast   CKD (chronic kidney disease)    GCA (giant cell arteritis) (HCC)    Hordeolum internum left lower eyelid 06/28/2019   Hypertension    Macular degeneration    Osteoporosis    PMR (polymyalgia rheumatica) (HCC)    Pulmonary hypertension, unspecified ([place]) 02/14/2021   Resistant hypertension 03/15/2019   Shortness of breath 03/15/2019   Skin cancer      Family History  Problem  Relation Age of Onset   Stroke Mother    Kidney failure Father        died at 67   Heart disease Sister        died at 86   Heart disease Sister        died at 64   Breast cancer Daughter      Past Surgical History:  Procedure Laterality Date   CARDIOVERSION N/A 10/18/2015   Procedure: CARDIOVERSION;  Surgeon: Jerline Pain, MD;  Location: [place];  Service: Cardiovascular;  Laterality: N/A;   CARDIOVERSION N/A 02/20/2017   Procedure: CARDIOVERSION;  Surgeon: Pixie Casino, MD;  Location[facility] ENDOSCOPY;  Service: Cardiovascular;  Laterality: N/A;   CARDIOVERSION N/A 06/18/2017   Procedure: CARDIOVERSION;  Surgeon: Sanda Klein, MD;  Location: [place] ENDOSCOPY;  Service: Cardiovascular;  Laterality: N/A;   CARDIOVERSION N/A 12/21/2018   Procedure: CARDIOVERSION;  Surgeon: Thayer Headings, MD;  Location: [facility] ENDOSCOPY;  Service: Cardiovascular;  Laterality: N/A;   IR CATHETER TUBE CHANGE  02/28/2021   KNEE SURGERY  2013   MASTECTOMY  1998   left side   PACEMAKER IMPLANT N/A 07/22/2019   Procedure: PACEMAKER IMPLANT;  Surgeon: Evans Lance, MD;  Location: [place] CV LAB;  Service: Cardiovascular;  Laterality: N/A;    Social History   Socioeconomic History   Marital status: Married  Spouse name: Not on file   Number of children: 2   Years of education: Not on file   Highest education level: Not on file  Occupational History   Not on file  Tobacco Use   Smoking status: Former    Types: Cigarettes    Quit date: 08/31/1966    Years since quitting: 55.4   Smokeless tobacco: Never  Vaping Use   Vaping Use: Never used  Substance and Sexual Activity   Alcohol use: No   Drug use: No   Sexual activity: Not on file  Other Topics Concern   Not on file  Social History Narrative   epworth sleepiness scale = 8 (08/31/15)   Social Determinants of Health   Financial Resource Strain: Not on file  Food Insecurity: Not on file  Transportation Needs: Not on file   Physical Activity: Not on file  Stress: Not on file  Social Connections: Not on file  Intimate Partner Violence: Not on file     Allergies  Allergen Reactions   Codeine Itching   Penicillins Itching, Rash and Other (See Comments)    Has patient had a PCN reaction causing immediate rash, facial/tongue/throat swelling, SOB or lightheadedness with hypotension: Yes Has patient had a PCN reaction causing severe rash involving mucus membranes or skin necrosis: No Has patient had a PCN reaction that required hospitalization: No Has patient had a PCN reaction occurring within the last 10 years: No If all of the above answers are "NO", then may proceed with Cephalosporin use.   Doxazosin     Heavy feeling in chest, congestion, wheezing   Doxycycline Other (See Comments)    Cause chest tightness   Hydralazine     Felt bad and could hear pulse in head      Outpatient Medications Prior to Visit  Medication Sig Dispense Refill   acetaminophen (TYLENOL) 500 MG tablet Take 1,000 mg by mouth every 6 (six) hours as needed for moderate pain.     Aflibercept (EYLEA) 2 MG/0.05ML SOLN 2 mg by Intravitreal route every 3 (three) months. Inject in R eye every 11 weeks per Retina specialist for Macular Degenration     amiodarone (PACERONE) 200 MG tablet Take 1/2 (one-half) tablet by mouth once daily 45 tablet 0   apixaban (ELIQUIS) 5 MG TABS tablet Take 1 tablet (5 mg total) by mouth 2 (two) times daily. 60 tablet 11   carvedilol (COREG) 25 MG tablet Take 1 tablet (25 mg total) by mouth 2 (two) times daily with a meal. 180 tablet 3   cloNIDine (CATAPRES) 0.2 MG tablet Take 0.'2mg'$  in the morning, 0.'3mg'$  in the afternoon at 2pm, and 0.'2mg'$  at bedtime. 180 tablet 1   cloNIDine (CATAPRES) 0.3 MG tablet TAKE 0.'2MG'$  BY MOUTH IN THE MORNING , 0.3 MG IN THE AFTERNOON AND 0.2 MG  AT BEDTIME 90 tablet 1   diazepam (VALIUM) 2 MG tablet Take 2 mg by mouth at bedtime as needed for anxiety.  0   digoxin (LANOXIN) 0.125 MG  tablet Take 1 tablet (125 mcg total) by mouth every other day. 45 tablet 2   furosemide (LASIX) 40 MG tablet Take 40 mg by mouth daily.     Melatonin 10 MG CAPS Take 10 mg by mouth at bedtime.     Multiple Vitamins-Minerals (PRESERVISION AREDS 2) CAPS Take 1 capsule by mouth daily.     pantoprazole (PROTONIX) 40 MG tablet Take 1 tablet (40 mg total) by mouth daily. 90 tablet 0  polyethylene glycol (MIRALAX / GLYCOLAX) 17 g packet Take 17 g by mouth daily. 14 each 0   predniSONE (DELTASONE) 5 MG tablet Take 5 mg by mouth every other day.     No facility-administered medications prior to visit.       Objective:   Physical Exam:  General appearance: 89 y.o., female, NAD, conversant  Eyes: anicteric sclerae; PERRL, tracking appropriately HENT: NCAT; MMM Neck: Trachea midline; no lymphadenopathy, no JVD Lungs: CTAB, no crackles, no wheeze, with normal respiratory effort CV: RRR, no murmur  Abdomen: Soft, non-tender; non-distended, BS present  Extremities: No peripheral edema, warm Skin: Normal turgor and texture; no rash Psych: Appropriate affect Neuro: Alert and oriented to person and place, no focal deficit     Vitals:   01/22/22 1410  BP: 122/68  Pulse: 61  Temp: 97.9 F (36.6 C)  TempSrc: Oral  SpO2: 95%  Weight: 128 lb 9.6 oz (58.3 kg)  Height: 5' 5.5" (1.664 m)     95% on RA BMI Readings from Last 3 Encounters:  01/22/22 21.07 kg/m  11/21/21 20.98 kg/m  09/23/21 20.66 kg/m   Wt Readings from Last 3 Encounters:  01/22/22 128 lb 9.6 oz (58.3 kg)  11/21/21 128 lb (58.1 kg)  09/23/21 126 lb 1.6 oz (57.2 kg)     CBC    Component Value Date/Time   WBC 7.0 08/12/2021 1548   WBC 8.2 03/28/2021 0423   RBC 4.20 08/12/2021 1548   RBC 3.50 (L) 03/28/2021 0423   HGB 12.7 08/12/2021 1548   HCT 39.1 08/12/2021 1548   PLT 252 08/12/2021 1548   MCV 93 08/12/2021 1548   MCH 30.2 08/12/2021 1548   MCH 30.0 03/28/2021 0423   MCHC 32.5 08/12/2021 1548   MCHC  31.0 03/28/2021 0423   RDW 14.3 08/12/2021 1548   LYMPHSABS 1.2 03/28/2021 0423   LYMPHSABS 1.3 07/05/2019 1048   MONOABS 1.1 (H) 03/28/2021 0423   EOSABS 0.4 03/28/2021 0423   EOSABS 0.3 07/05/2019 1048   BASOSABS 0.0 03/28/2021 0423   BASOSABS 0.0 07/05/2019 1048    Chest Imaging: CTA Chest 03/2021 reviewed by me with RLL segmental PE, ggo/consolidation RUL/bilateral lower lobes, 25m RUL nodule, 3.3 cm anterior mediastinal mass  Pulmonary Functions Testing Results:    Latest Ref Rng & Units 04/21/2017   12:49 PM  PFT Results  FVC-Pre L 1.85   FVC-Predicted Pre % 78   Pre FEV1/FVC % % 79   FEV1-Pre L 1.46   FEV1-Predicted Pre % 83   DLCO uncorrected ml/min/mmHg 15.07   DLCO UNC% % 55   DLVA Predicted % 88   TLC L 5.46   TLC % Predicted % 102   RV % Predicted % 130   PFT 2019 with moderately reduced diffusing capacity    Echocardiogram:   TTE 03/25/21:  1. Left ventricular ejection fraction, by estimation, is 60 to 65%. The  left ventricle has normal function. The left ventricle has no regional  wall motion abnormalities. There is mild left ventricular hypertrophy.  Left ventricular diastolic parameters  were normal.   2. Right ventricular systolic function is normal. The right ventricular  size is normal.   3. Left atrial size was moderately dilated.   4. ? prominent Chiari Network and pacing wires in RA/RV Cannot r/o right  sided source of embolus . Right atrial size was mildly dilated.   5. The mitral valve is degenerative. Mild mitral valve regurgitation. No  evidence of mitral stenosis. Moderate  mitral annular calcification.   6. Sclerotic and calcified non coronary cusp. The aortic valve is normal  in structure. There is moderate calcification of the aortic valve. There  is moderate thickening of the aortic valve. Aortic valve regurgitation is  not visualized. Mild to moderate  aortic valve stenosis.   7. The inferior vena cava is normal in size with greater  than 50%  respiratory variability, suggesting right atrial pressure of 3 mmHg.       Assessment & Plan:   # chronic cough With frequent throat clearing, association with eating and nocturnal sx, onset around time of discontinuation of ppi strong suspicion for LPR/irritable larynx. Think sinus congestion/PND may be contributory but she is reluctant to stick with flonase.  # anterior mediastinal mass  # 14m RUL pulmonary nodule  Plan: - ok to stop protonix 40 mg. If cough worsens after stopping would resume. Call our clinic if you need new prescription for it - ok to continue famotidine (pepcid) 20 mg daily 30 minutes before dinner - we will try an inhaler. I will price them out with pharmacy before prescribing and let you know how to use it - declined surveillance CT Chest, encouraged her to think about whether or not she wants [place] order CT chest to see if there's anything that explains cough (like chronic low grade infection) or increase size in nodules that could be concerning for cancer.  - see you in 8 weeks or sooner if need be!     NMaryjane Hurter MD [facility] 01/22/2022 2:18 PM

## 2022-01-21 LAB — CUP PACEART REMOTE DEVICE CHECK
Battery Remaining Longevity: 78 mo
Battery Remaining Percentage: 100 %
Brady Statistic RA Percent Paced: 98 %
Brady Statistic RV Percent Paced: 6 %
Date Time Interrogation Session: 20240112022100
Implantable Lead Connection Status: 753985
Implantable Lead Connection Status: 753985
Implantable Lead Implant Date: 20210716
Implantable Lead Implant Date: 20210716
Implantable Lead Location: 753859
Implantable Lead Location: 753860
Implantable Lead Model: 7840
Implantable Lead Model: 7841
Implantable Lead Serial Number: 1017229
Implantable Lead Serial Number: 1090224
Implantable Pulse Generator Implant Date: 20210716
Lead Channel Impedance Value: 609 Ohm
Lead Channel Impedance Value: 617 Ohm
Lead Channel Pacing Threshold Amplitude: 0.6 V
Lead Channel Pacing Threshold Amplitude: 1.1 V
Lead Channel Pacing Threshold Pulse Width: 0.4 ms
Lead Channel Pacing Threshold Pulse Width: 0.4 ms
Lead Channel Setting Pacing Amplitude: 2 V
Lead Channel Setting Pacing Amplitude: 2.5 V
Lead Channel Setting Pacing Pulse Width: 0.4 ms
Lead Channel Setting Sensing Sensitivity: 2.5 mV
Pulse Gen Serial Number: 547553
Zone Setting Status: 755011

## 2022-01-22 ENCOUNTER — Telehealth: Payer: Self-pay | Admitting: Student

## 2022-01-22 ENCOUNTER — Ambulatory Visit (INDEPENDENT_AMBULATORY_CARE_PROVIDER_SITE_OTHER): Payer: Medicare Other | Admitting: Student

## 2022-01-22 ENCOUNTER — Encounter: Payer: Self-pay | Admitting: Student

## 2022-01-22 VITALS — BP 122/68 | HR 61 | Temp 97.9°F | Ht 65.5 in | Wt 128.6 lb

## 2022-01-22 DIAGNOSIS — R911 Solitary pulmonary nodule: Secondary | ICD-10-CM | POA: Diagnosis not present

## 2022-01-22 DIAGNOSIS — R053 Chronic cough: Secondary | ICD-10-CM

## 2022-01-22 NOTE — Patient Instructions (Signed)
-  ok to stop protonix 40 mg. If cough worsens after stopping would resume. Call our clinic if you need new prescription for it - ok to continue famotidine (pepcid) 20 mg daily 30 minutes before dinner - we will try an inhaler. I will price them out with pharmacy before prescribing and let you know how to use it - think about whether or not you want [place] to order CT chest to see if there's anything that explains cough (like chronic low grade infection) or increase size in nodules that could be concerning for cancer. For now we're holding off.  - see you in 8 weeks or sooner if need be!

## 2022-01-22 NOTE — Telephone Encounter (Signed)
Least expensive LABA/ICS?  Thanks!

## 2022-01-23 ENCOUNTER — Other Ambulatory Visit (HOSPITAL_COMMUNITY): Payer: Self-pay

## 2022-01-23 MED ORDER — BUDESONIDE-FORMOTEROL FUMARATE 80-4.5 MCG/ACT IN AERO: 2.0000 | INHALATION_SPRAY | Freq: Two times a day (BID) | RESPIRATORY_TRACT | 12 refills | Status: DC

## 2022-01-23 NOTE — Telephone Encounter (Signed)
Per benefits investigation the covered ICS/LABA are [place] (generic Advair Diksus), Adair Patter, Dulera, Adviar HFA, and Symbicort HFA. All seem to be the same price under the patients insurance plan at this time.

## 2022-01-23 NOTE — Telephone Encounter (Signed)
Prescription for symbicort sent to [facility]

## 2022-01-29 DIAGNOSIS — E781 Pure hyperglyceridemia: Secondary | ICD-10-CM | POA: Diagnosis not present

## 2022-01-29 DIAGNOSIS — K219 Gastro-esophageal reflux disease without esophagitis: Secondary | ICD-10-CM | POA: Diagnosis not present

## 2022-01-29 DIAGNOSIS — I4891 Unspecified atrial fibrillation: Secondary | ICD-10-CM | POA: Diagnosis not present

## 2022-01-29 DIAGNOSIS — N184 Chronic kidney disease, stage 4 (severe): Secondary | ICD-10-CM | POA: Diagnosis not present

## 2022-01-29 DIAGNOSIS — D649 Anemia, unspecified: Secondary | ICD-10-CM | POA: Diagnosis not present

## 2022-01-29 DIAGNOSIS — M179 Osteoarthritis of knee, unspecified: Secondary | ICD-10-CM | POA: Diagnosis not present

## 2022-01-29 DIAGNOSIS — G4700 Insomnia, unspecified: Secondary | ICD-10-CM | POA: Diagnosis not present

## 2022-01-29 DIAGNOSIS — I10 Essential (primary) hypertension: Secondary | ICD-10-CM | POA: Diagnosis not present

## 2022-02-04 ENCOUNTER — Other Ambulatory Visit (HOSPITAL_COMMUNITY): Payer: Self-pay | Admitting: Internal Medicine

## 2022-02-04 NOTE — Progress Notes (Signed)
Remote pacemaker transmission.   

## 2022-02-13 ENCOUNTER — Encounter (HOSPITAL_COMMUNITY): Payer: Self-pay | Admitting: *Deleted

## 2022-03-11 DIAGNOSIS — Z6820 Body mass index (BMI) 20.0-20.9, adult: Secondary | ICD-10-CM | POA: Diagnosis not present

## 2022-03-11 DIAGNOSIS — K219 Gastro-esophageal reflux disease without esophagitis: Secondary | ICD-10-CM | POA: Diagnosis not present

## 2022-03-11 DIAGNOSIS — M353 Polymyalgia rheumatica: Secondary | ICD-10-CM | POA: Diagnosis not present

## 2022-03-11 DIAGNOSIS — R059 Cough, unspecified: Secondary | ICD-10-CM | POA: Diagnosis not present

## 2022-03-17 DIAGNOSIS — H353211 Exudative age-related macular degeneration, right eye, with active choroidal neovascularization: Secondary | ICD-10-CM | POA: Diagnosis not present

## 2022-03-17 DIAGNOSIS — H35351 Cystoid macular degeneration, right eye: Secondary | ICD-10-CM | POA: Diagnosis not present

## 2022-03-17 DIAGNOSIS — H353124 Nonexudative age-related macular degeneration, left eye, advanced atrophic with subfoveal involvement: Secondary | ICD-10-CM | POA: Diagnosis not present

## 2022-03-20 ENCOUNTER — Ambulatory Visit (INDEPENDENT_AMBULATORY_CARE_PROVIDER_SITE_OTHER): Payer: Medicare Other

## 2022-03-20 DIAGNOSIS — I350 Nonrheumatic aortic (valve) stenosis: Secondary | ICD-10-CM

## 2022-03-20 LAB — ECHOCARDIOGRAM COMPLETE
AR max vel: 1.3 cm2
AV Area VTI: 1.23 cm2
AV Area mean vel: 1.19 cm2
AV Mean grad: 8 mmHg
AV Peak grad: 14.4 mmHg
Ao pk vel: 1.9 m/s
Area-P 1/2: 3.72 cm2
MV M vel: 6.4 m/s
MV Peak grad: 163.6 mmHg
S' Lateral: 1.82 cm

## 2022-03-24 ENCOUNTER — Encounter (HOSPITAL_BASED_OUTPATIENT_CLINIC_OR_DEPARTMENT_OTHER): Payer: Self-pay | Admitting: Cardiovascular Disease

## 2022-03-24 ENCOUNTER — Ambulatory Visit: Payer: Medicare Other | Admitting: Student

## 2022-03-24 ENCOUNTER — Ambulatory Visit (INDEPENDENT_AMBULATORY_CARE_PROVIDER_SITE_OTHER): Payer: Medicare Other | Admitting: Cardiovascular Disease

## 2022-03-24 VITALS — BP 134/60 | HR 60 | Ht 65.5 in | Wt 127.5 lb

## 2022-03-24 DIAGNOSIS — I1A0 Resistant hypertension: Secondary | ICD-10-CM | POA: Diagnosis not present

## 2022-03-24 DIAGNOSIS — I2699 Other pulmonary embolism without acute cor pulmonale: Secondary | ICD-10-CM

## 2022-03-24 DIAGNOSIS — I480 Paroxysmal atrial fibrillation: Secondary | ICD-10-CM | POA: Diagnosis not present

## 2022-03-24 DIAGNOSIS — I350 Nonrheumatic aortic (valve) stenosis: Secondary | ICD-10-CM | POA: Diagnosis not present

## 2022-03-24 NOTE — Assessment & Plan Note (Signed)
Mild aortic stenosis.  Mean gradient was 8 mmHg on her recent echo.  She is asymptomatic.

## 2022-03-24 NOTE — Assessment & Plan Note (Addendum)
Continue Eliquis, carvedilol, amiodarone, and digoxin.  Currently in sinus rhythm.

## 2022-03-24 NOTE — Patient Instructions (Signed)
Medication Instructions:  Your physician recommends that you continue on your current medications as directed. Please refer to the Current Medication list given to you today.  *If you need a refill on your cardiac medications before your next appointment, please call your pharmacy*  Lab Work: NONE  Testing/Procedures: NONE  Follow-Up: At [facility], you and your health needs are our priority.  As part of our continuing mission to provide you with exceptional heart care, we have created designated Provider Care Teams.  These Care Teams include your primary Cardiologist (physician) and Advanced Practice Providers (APPs -  Physician Assistants and Nurse Practitioners) who all work together to provide you with the care you need, when you need it.  We recommend signing up for the patient portal called "MyChart".  Sign up information is provided on this After Visit Summary.  MyChart is used to connect with patients for Virtual Visits (Telemedicine).  Patients are able to view lab/test results, encounter notes, upcoming appointments, etc.  Non-urgent messages can be sent to your provider as well.   To learn more about what you can do with MyChart, go to [email or web address].    Your next appointment:   6 month(s)  Provider:   Laurann Montana, NP    DR [facility] IN 1 YEAR

## 2022-03-24 NOTE — Progress Notes (Unsigned)
Synopsis: Referred for cough by [name], MD  Subjective:   PATIENT ID: Allison Norman GENDER: female DOB: 08-08-1924, MRN: [id number]  No chief complaint on file.  CC: cough  89yF with temporal arteritis, AF, tachy-brady, resistant HTN, PH, PE, AS referred for cough  Coughing up clear stuff since May. Never has had covid-19 infection. She did have some heartburn in past/reflux but she has stopped taking those pills over last 4 months. She does have some sinus congestion/postnasal drainage and was prescribed a spray - she thinks flonase but she doesn't think it was very helpful. She notices this especially after eating. Frequently feels sensation of having a lot of phlegm in her throat. She has not tried inhaler for this.   No DOE relative to peers.   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 No MJ, vaping. She has a cat.   Interval HPI:   Stopped ppi, continued pepcid and started symbicort 80 last visit. At first she felt like it may have helped though cough now persists. She does have sinus congestion, postnasal drainage. Has been using flonase for a couple weeks now.   Stable TTE  Otherwise pertinent review of systems is negative.   Past Medical History:  Diagnosis Date   Aortic stenosis 08/20/2021   Mild-moderate.  Repeat echo in one year.   Atrial fibrillation ([place]) 2017   a. s/p DCCV in 10/2015  b. recurrent in 08/2016 --> rate-control pursued.    Cancer (HCC)    Breast   CKD (chronic kidney disease)    GCA (giant cell arteritis) (HCC)    Hordeolum internum left lower eyelid 06/28/2019   Hypertension    Macular degeneration    Osteoporosis    PMR (polymyalgia rheumatica) (HCC)    Pulmonary hypertension, unspecified ([place]) 02/14/2021   Resistant hypertension 03/15/2019   Shortness of breath 03/15/2019   Skin cancer      Family History  Problem Relation Age of Onset   Stroke Mother    Kidney failure  Father        died at 75   Heart disease Sister        died at 58   Heart disease Sister        died at 49   Breast cancer Daughter      Past Surgical History:  Procedure Laterality Date   CARDIOVERSION N/A 10/18/2015   Procedure: CARDIOVERSION;  Surgeon: Jerline Pain, MD;  Location: [place];  Service: Cardiovascular;  Laterality: N/A;   CARDIOVERSION N/A 02/20/2017   Procedure: CARDIOVERSION;  Surgeon: Pixie Casino, MD;  Location: [facility] ENDOSCOPY;  Service: Cardiovascular;  Laterality: N/A;   CARDIOVERSION N/A 06/18/2017   Procedure: CARDIOVERSION;  Surgeon: Sanda Klein, MD;  Location: [place] ENDOSCOPY;  Service: Cardiovascular;  Laterality: N/A;   CARDIOVERSION N/A 12/21/2018   Procedure: CARDIOVERSION;  Surgeon: Thayer Headings, MD;  Location: [facility] ENDOSCOPY;  Service: Cardiovascular;  Laterality: N/A;   IR CATHETER TUBE CHANGE  02/28/2021   KNEE SURGERY  2013   MASTECTOMY  1998   left side   PACEMAKER IMPLANT N/A 07/22/2019   Procedure: PACEMAKER IMPLANT;  Surgeon: Evans Lance, MD;  Location: [place] CV LAB;  Service: Cardiovascular;  Laterality: N/A;    Social History   Socioeconomic History   Marital status: Married    Spouse name: Not on file   Number of children: 2  Years of education: Not on file   Highest education level: Not on file  Occupational History   Not on file  Tobacco Use   Smoking status: Former    Types: Cigarettes    Quit date: 08/31/1966    Years since quitting: 55.6   Smokeless tobacco: Never  Vaping Use   Vaping Use: Never used  Substance and Sexual Activity   Alcohol use: No   Drug use: No   Sexual activity: Not on file  Other Topics Concern   Not on file  Social History Narrative   epworth sleepiness scale = 8 (08/31/15)   Social Determinants of Health   Financial Resource Strain: Not on file  Food Insecurity: Not on file  Transportation Needs: Not on file  Physical Activity: Not on file  Stress: Not on file  Social  Connections: Not on file  Intimate Partner Violence: Not on file     Allergies  Allergen Reactions   Codeine Itching   Penicillins Itching, Rash and Other (See Comments)    Has patient had a PCN reaction causing immediate rash, facial/tongue/throat swelling, SOB or lightheadedness with hypotension: Yes Has patient had a PCN reaction causing severe rash involving mucus membranes or skin necrosis: No Has patient had a PCN reaction that required hospitalization: No Has patient had a PCN reaction occurring within the last 10 years: No If all of the above answers are "NO", then may proceed with Cephalosporin use.   Doxazosin     Heavy feeling in chest, congestion, wheezing   Doxycycline Other (See Comments)    Cause chest tightness   Hydralazine     Felt bad and could hear pulse in head      Outpatient Medications Prior to Visit  Medication Sig Dispense Refill   acetaminophen (TYLENOL) 500 MG tablet Take 1,000 mg by mouth every 6 (six) hours as needed for moderate pain.     Aflibercept (EYLEA) 2 MG/0.05ML SOLN 2 mg by Intravitreal route every 3 (three) months. Inject in R eye every 11 weeks per Retina specialist for Macular Degenration     amiodarone (PACERONE) 200 MG tablet Take 1/2 (one-half) tablet by mouth once daily 45 tablet 8   apixaban (ELIQUIS) 5 MG TABS tablet Take 1 tablet (5 mg total) by mouth 2 (two) times daily. 60 tablet 11   budesonide-formoterol (SYMBICORT) 80-4.5 MCG/ACT inhaler Inhale 2 puffs into the lungs in the morning and at bedtime. 1 each 12   carvedilol (COREG) 25 MG tablet Take 1 tablet (25 mg total) by mouth 2 (two) times daily with a meal. 180 tablet 3   cloNIDine (CATAPRES) 0.2 MG tablet Take 0.2mg  in the morning, 0.3mg  in the afternoon at 2pm, and 0.2mg  at bedtime. 180 tablet 1   cloNIDine (CATAPRES) 0.3 MG tablet TAKE 0.2MG  BY MOUTH IN THE MORNING , 0.3 MG IN THE AFTERNOON AND 0.2 MG  AT BEDTIME 90 tablet 1   diazepam (VALIUM) 2 MG tablet Take 2 mg by mouth  at bedtime as needed for anxiety.  0   digoxin (LANOXIN) 0.125 MG tablet Take 1 tablet (125 mcg total) by mouth every other day. 45 tablet 2   famotidine (PEPCID) 40 MG tablet Take 40 mg by mouth daily.     furosemide (LASIX) 40 MG tablet Take 40 mg by mouth daily.     Melatonin 10 MG CAPS Take 10 mg by mouth at bedtime.     Multiple Vitamins-Minerals (PRESERVISION AREDS 2) CAPS Take 1 capsule by  mouth daily.     pantoprazole (PROTONIX) 40 MG tablet Take 1 tablet (40 mg total) by mouth daily. 90 tablet 0   polyethylene glycol (MIRALAX / GLYCOLAX) 17 g packet Take 17 g by mouth daily. 14 each 0   predniSONE (DELTASONE) 5 MG tablet Take 5 mg by mouth every other day.     No facility-administered medications prior to visit.       Objective:   Physical Exam:  General appearance: 89 y.o., female, NAD, conversant  Eyes: anicteric sclerae; PERRL, tracking appropriately HENT: NCAT; MMM Neck: Trachea midline; no lymphadenopathy, no JVD Lungs: CTAB, no crackles, no wheeze, with normal respiratory effort CV: RRR, no murmur  Abdomen: Soft, non-tender; non-distended, BS present  Extremities: No peripheral edema, warm Skin: Normal turgor and texture; no rash Psych: Appropriate affect Neuro: Alert and oriented to person and place, no focal deficit     Vitals:   03/25/22 1356  BP: (!) 142/62  Pulse: 60  Temp: 98 F (36.7 C)  TempSrc: Oral  SpO2: 91%  Weight: 127 lb (57.6 kg)  Height: 5' 5.5" (1.664 m)      91% on RA BMI Readings from Last 3 Encounters:  03/25/22 20.81 kg/m  03/24/22 20.89 kg/m  01/22/22 21.07 kg/m   Wt Readings from Last 3 Encounters:  03/25/22 127 lb (57.6 kg)  03/24/22 127 lb 8 oz (57.8 kg)  01/22/22 128 lb 9.6 oz (58.3 kg)     CBC    Component Value Date/Time   WBC 7.0 08/12/2021 1548   WBC 8.2 03/28/2021 0423   RBC 4.20 08/12/2021 1548   RBC 3.50 (L) 03/28/2021 0423   HGB 12.7 08/12/2021 1548   HCT 39.1 08/12/2021 1548   PLT 252 08/12/2021  1548   MCV 93 08/12/2021 1548   MCH 30.2 08/12/2021 1548   MCH 30.0 03/28/2021 0423   MCHC 32.5 08/12/2021 1548   MCHC 31.0 03/28/2021 0423   RDW 14.3 08/12/2021 1548   LYMPHSABS 1.2 03/28/2021 0423   LYMPHSABS 1.3 07/05/2019 1048   MONOABS 1.1 (H) 03/28/2021 0423   EOSABS 0.4 03/28/2021 0423   EOSABS 0.3 07/05/2019 1048   BASOSABS 0.0 03/28/2021 0423   BASOSABS 0.0 07/05/2019 1048    Chest Imaging: CTA Chest 03/2021 reviewed by me with RLL segmental PE, ggo/consolidation RUL/bilateral lower lobes, 39mm RUL nodule, 3.3 cm anterior mediastinal mass  Pulmonary Functions Testing Results:    Latest Ref Rng & Units 04/21/2017   12:49 PM  PFT Results  FVC-Pre L 1.85   FVC-Predicted Pre % 78   Pre FEV1/FVC % % 79   FEV1-Pre L 1.46   FEV1-Predicted Pre % 83   DLCO uncorrected ml/min/mmHg 15.07   DLCO UNC% % 55   DLVA Predicted % 88   TLC L 5.46   TLC % Predicted % 102   RV % Predicted % 130   PFT 2019 with moderately reduced diffusing capacity    Echocardiogram:   TTE 03/20/22:  1. Left ventricular ejection fraction, by estimation, is 65 to 70%. The  left ventricle has normal function. The left ventricle has no regional  wall motion abnormalities. There is mild concentric left ventricular  hypertrophy. Left ventricular diastolic  parameters are consistent with Grade II diastolic dysfunction  (pseudonormalization). The average left ventricular global longitudinal  strain is -16.8 %. The global longitudinal strain is abnormal.   2. Right ventricular systolic function is normal. The right ventricular  size is normal. There is normal pulmonary artery  systolic pressure. The  estimated right ventricular systolic pressure is 99991111 mmHg.   3. Left atrial size was severely dilated.   4. The mitral valve is degenerative. Mild to moderate mitral valve  regurgitation. No evidence of mitral stenosis.   5. The aortic valve is tricuspid. There is moderate calcification of the  aortic  valve. There is moderate thickening of the aortic valve. Aortic  valve regurgitation is not visualized. Mild aortic valve stenosis. Aortic  valve mean gradient measures 8.0  mmHg. Aortic valve Vmax measures 1.90 m/s.   6. The inferior vena cava is dilated in size with >50% respiratory  variability, suggesting right atrial pressure of 8 mmHg.       Assessment & Plan:   # chronic cough With frequent throat clearing, association with eating and nocturnal sx, onset around time of discontinuation of ppi strong suspicion for LPR/irritable larynx. Think sinus congestion/PND may be contributory but she has been reluctant to stick with flonase.  # anterior mediastinal mass  # 10mm RUL pulmonary nodule  Plan: - take shower, clear nose of crusting, then flonase 1 spray each nostril everyday at least till we get through pollen season - would try claritin 10 mg daily for a week, if no better then STOP this medication and START singulair 10 mg daily. If feeing better on claritin though, would simply continue claritin at least until next visit.  - symbicort 2 puffs twice daily - rinse mouth and brush teeth/tongue after each use - CT Chest you'll be called to schedule - if cough no better next visit consider referral to ENT - see you in 8 weeks or sooner if need be!     Maryjane Hurter, MD [facility] 03/25/2022 2:14 PM

## 2022-03-24 NOTE — Assessment & Plan Note (Signed)
-   Continue Eliquis 

## 2022-03-24 NOTE — Progress Notes (Signed)
Cardiology Office Note:   Date:  03/24/2022   ID:  Allison Norman, DOB April 23, 1924, MRN [id number]  PCP:  [name], MD  Cardiologist:  Skeet Latch, MD    Referring MD: [name], MD   CC: Hypertension  History of Present Illness:    Allison Norman is a 89 y.o. female with a hx of paroxysmal atrial fibrillation, hypertension, and CKD IV here for follow up. She initially established care in the hypertension clinic 02/2019. She reports that she was diagnosed with hypertension several years ago. Prior to developing atrial fibrillation her BP was better controlled. Her medications were changed for improved rate control. She has been working with [name], PA-C for her hypertension lately. Amlodipine was increased to 10mg  but she developed LE edema, so amlodipine was reduced back to 5mg  and lasix was started. She only uses lasix sporadically. She noted that she sometimes forgets to take her evening dose of clonidine. At that time hydralazine was increased to 50 mg 3 times daily. She was also started on doxazosin. Metoprolol was switched to carvedilol. Doxazosin was discontinued due to shortness of breath and heaviness in her chest.  Of note, she had also stopped taking her furosemide. Once this restarted her breathing improved. Her blood pressure in the office seemed to be consistently elevated. When she last our pharmacist on 06/2019 her average blood pressure in the morning was 143/75 and 139/75 in the evenings. She was started on minoxidil and the dose was increased to 10 mg. Since that time she had stopped both hydralazine and doxazosin due to intolerances.  She was started on carvedilol 12.5 mg. She noted pulsing in her ears that didn't improve with flonase and Claritin. Her dose of carvedilol was reduced and the pulsing sensation in her ears improved. She was started on diltiazem but did not tolerate it due to lower extremity edema. Her clonidine was increased to 0.3mg   tid. She had an abnormal pulmonary function test in 2019 but had never seen pulmonology. Carotid dopplers 01/2020 showed 1-39% stenosis bilaterally. She had an echo 01/2020 that revealed LVEF 65-70% with severe LVH and grade 3 diastolic dysfunction. It also showed severe bi-atrial enlargement and moderate to severe tricuspid regurgitation. Aortic stenosis was mild. PASP was 33mmHg. We reccommended increasing her Lasix to daily. She declined further evaluation of her severe pulmonary hypertension and valvular heart disease. She saw Dr. Lovena Le 11/2020 and digoxin was added.   Her blood pressure was controlled in the office, but she noted it was high at home. She was hospitalized 03/2021 with pulmonary embolism. Hematology recommended increasing her Eliquis to 5 mg twice daily. She was also treated for pneumonia.  She saw Laurann Montana, NP 06/2021 and noted that her blood pressures were high in the evenings, so the patient decided to increase her afternoon and evening doses of clonidine. Caitlin recommended that she take 0.2 mg in the morning and evening, and 0.3 mg in the afternoons.  At her last visit, she was struggling with a lingering cough since her PE. At home she noted labile blood pressures, ranging 99-153/56-74. Carvedilol was increased to 25 mg BID. She was maintaining sinus rhythm on amiodarone. At her follow-up with Laurann Montana, NP 09/2021 her blood pressure was overall controlled at home. It was noted that she had seen pulmonology and there was suspicion for LPR/irritable larynx. Protonix 40 mg had been resumed and she had seen improvement in her cough. She had eventually stopped taking Protonix and  was later recommended to take famotidine. She had an echocardiogram 03/2022 revealing LVEF 65-70%, mild LVH, and grade 2 diastolic dysfunction.   Today, she is feeling alright but continues to suffer from intermittent coughing spells. The spells are severe, and then she may not cough for a while until the  next spell. She has seen pulmonology but her treatments have not been very effective. In clinic today her blood pressure was initially 149/70 and improved to 134/60 on recheck. She monitors her BP inconsistently at home. It has been labile, ranging [id number] systolic. If she takes her clonidine in the afternoon, then her readings are more controlled. If she takes the afternoon dose later at night she will notice higher blood pressures. Lately she is not completing much exercise. She believes it will be easier to exercise more frequently in the warmer weather. She denies any palpitations, chest pain, shortness of breath, or peripheral edema. No lightheadedness, headaches, syncope, orthopnea, or PND.  Previous antihypertensives: Amlodipine - edema at 10 mg Doxazosin Hydralazine Diltiazem - leg swelling  Past Medical History:  Diagnosis Date   Aortic stenosis 08/20/2021   Mild-moderate.  Repeat echo in one year.   Atrial fibrillation ([place]) 2017   a. s/p DCCV in 10/2015  b. recurrent in 08/2016 --> rate-control pursued.    Cancer ([place])    Breast   CKD (chronic kidney disease)    GCA (giant cell arteritis) (HCC)    Hordeolum internum left lower eyelid 06/28/2019   Hypertension    Macular degeneration    Osteoporosis    PMR (polymyalgia rheumatica) (HCC)    Pulmonary hypertension, unspecified ([place]) 02/14/2021   Resistant hypertension 03/15/2019   Shortness of breath 03/15/2019   Skin cancer     Past Surgical History:  Procedure Laterality Date   CARDIOVERSION N/A 10/18/2015   Procedure: CARDIOVERSION;  Surgeon: Jerline Pain, MD;  Location: [facility];  Service: Cardiovascular;  Laterality: N/A;   CARDIOVERSION N/A 02/20/2017   Procedure: CARDIOVERSION;  Surgeon: Pixie Casino, MD;  Location: [facility];  Service: Cardiovascular;  Laterality: N/A;   CARDIOVERSION N/A 06/18/2017   Procedure: CARDIOVERSION;  Surgeon: Sanda Klein, MD;  Location: [facility] ENDOSCOPY;  Service: Cardiovascular;   Laterality: N/A;   CARDIOVERSION N/A 12/21/2018   Procedure: CARDIOVERSION;  Surgeon: Thayer Headings, MD;  Location: [facility] ENDOSCOPY;  Service: Cardiovascular;  Laterality: N/A;   IR CATHETER TUBE CHANGE  02/28/2021   KNEE SURGERY  2013   MASTECTOMY  1998   left side   PACEMAKER IMPLANT N/A 07/22/2019   Procedure: PACEMAKER IMPLANT;  Surgeon: Evans Lance, MD;  Location: [facility];  Service: Cardiovascular;  Laterality: N/A;    Current Medications: Current Meds  Medication Sig   acetaminophen (TYLENOL) 500 MG tablet Take 1,000 mg by mouth every 6 (six) hours as needed for moderate pain.   Aflibercept (EYLEA) 2 MG/0.05ML SOLN 2 mg by Intravitreal route every 3 (three) months. Inject in R eye every 11 weeks per Retina specialist for Macular Degenration   amiodarone (PACERONE) 200 MG tablet Take 1/2 (one-half) tablet by mouth once daily   apixaban (ELIQUIS) 5 MG TABS tablet Take 1 tablet (5 mg total) by mouth 2 (two) times daily.   budesonide-formoterol (SYMBICORT) 80-4.5 MCG/ACT inhaler Inhale 2 puffs into the lungs in the morning and at bedtime.   carvedilol (COREG) 25 MG tablet Take 1 tablet (25 mg total) by mouth 2 (two) times daily with a meal.   cloNIDine (CATAPRES) 0.2 MG  tablet Take 0.2mg  in the morning, 0.3mg  in the afternoon at 2pm, and 0.2mg  at bedtime.   cloNIDine (CATAPRES) 0.3 MG tablet TAKE 0.2MG  BY MOUTH IN THE MORNING , 0.3 MG IN THE AFTERNOON AND 0.2 MG  AT BEDTIME   diazepam (VALIUM) 2 MG tablet Take 2 mg by mouth at bedtime as needed for anxiety.   digoxin (LANOXIN) 0.125 MG tablet Take 1 tablet (125 mcg total) by mouth every other day.   furosemide (LASIX) 40 MG tablet Take 40 mg by mouth daily.   Melatonin 10 MG CAPS Take 10 mg by mouth at bedtime.   Multiple Vitamins-Minerals (PRESERVISION AREDS 2) CAPS Take 1 capsule by mouth daily.   pantoprazole (PROTONIX) 40 MG tablet Take 1 tablet (40 mg total) by mouth daily.   polyethylene glycol (MIRALAX / GLYCOLAX) 17  g packet Take 17 g by mouth daily.   predniSONE (DELTASONE) 5 MG tablet Take 5 mg by mouth every other day.     Allergies:   Codeine, Penicillins, Doxazosin, Doxycycline, and Hydralazine   Social History   Socioeconomic History   Marital status: Married    Spouse name: Not on file   Number of children: 2   Years of education: Not on file   Highest education level: Not on file  Occupational History   Not on file  Tobacco Use   Smoking status: Former    Types: Cigarettes    Quit date: 08/31/1966    Years since quitting: 55.6   Smokeless tobacco: Never  Vaping Use   Vaping Use: Never used  Substance and Sexual Activity   Alcohol use: No   Drug use: No   Sexual activity: Not on file  Other Topics Concern   Not on file  Social History Narrative   epworth sleepiness scale = 8 (08/31/15)   Social Determinants of Health   Financial Resource Strain: Not on file  Food Insecurity: Not on file  Transportation Needs: Not on file  Physical Activity: Not on file  Stress: Not on file  Social Connections: Not on file     Family History: The patient's family history includes Breast cancer in her daughter; Heart disease in her sister and sister; Kidney failure in her father; Stroke in her mother.  ROS:   Please see the history of present illness.    (+) Cough All other systems reviewed and are negative.  EKGs/Labs/Other Studies Reviewed:    Echocardiogram  03/20/2022:  1. Left ventricular ejection fraction, by estimation, is 65 to 70%. The  left ventricle has normal function. The left ventricle has no regional  wall motion abnormalities. There is mild concentric left ventricular  hypertrophy. Left ventricular diastolic  parameters are consistent with Grade II diastolic dysfunction  (pseudonormalization). The average left ventricular global longitudinal  strain is -16.8 %. The global longitudinal strain is abnormal.   2. Right ventricular systolic function is normal. The right  ventricular  size is normal. There is normal pulmonary artery systolic pressure. The  estimated right ventricular systolic pressure is 99991111 mmHg.   3. Left atrial size was severely dilated.   4. The mitral valve is degenerative. Mild to moderate mitral valve  regurgitation. No evidence of mitral stenosis.   5. The aortic valve is tricuspid. There is moderate calcification of the  aortic valve. There is moderate thickening of the aortic valve. Aortic  valve regurgitation is not visualized. Mild aortic valve stenosis. Aortic  valve mean gradient measures 8.0  mmHg. Aortic valve Vmax  measures 1.90 m/s.   6. The inferior vena cava is dilated in size with >50% respiratory  variability, suggesting right atrial pressure of 8 mmHg.   Comparison(s): No significant change from prior study. 03/25/21: Aortic  valve mean gradient measures 13.3 mmHg. Aortic valve peak gradient  measures 22.8 mmHg. Aortic valve area, by VTI measures 1.27 cm.   Echo  03/25/2021:  1. Left ventricular ejection fraction, by estimation, is 60 to 65%. The  left ventricle has normal function. The left ventricle has no regional  wall motion abnormalities. There is mild left ventricular hypertrophy.  Left ventricular diastolic parameters  were normal.   2. Right ventricular systolic function is normal. The right ventricular  size is normal.   3. Left atrial size was moderately dilated.   4. ? prominent [employer] and pacing wires in RA/RV Cannot r/o right  sided source of embolus . Right atrial size was mildly dilated.   5. The mitral valve is degenerative. Mild mitral valve regurgitation. No  evidence of mitral stenosis. Moderate mitral annular calcification.   6. Sclerotic and calcified non coronary cusp. The aortic valve is normal  in structure. There is moderate calcification of the aortic valve. There  is moderate thickening of the aortic valve. Aortic valve regurgitation is  not visualized. Mild to moderate   aortic valve stenosis.   7. The inferior vena cava is normal in size with greater than 50%  respiratory variability, suggesting right atrial pressure of 3 mmHg.   Bilateral LE Venous Doppler 03/25/2021: Summary:  BILATERAL:  -No evidence of popliteal cyst, bilaterally.  RIGHT:  - There is no evidence of deep vein thrombosis in the lower extremity.  However, portions of this examination were limited- see technologist  comments above.     LEFT:  - There is no evidence of deep vein thrombosis in the lower extremity.  However, portions of this examination were limited- see technologist  comments above.   CTA Chest  03/24/2021: IMPRESSION: 1. Positive for nonocclusive thrombus within proximal segmental right lower lobe branch. 2. Patchy ground-glass and consolidative opacities within the dependent right upper lobe and bilateral lower lobes, concerning for pneumonia and/or aspiration. 3. Approximately 8 mm pulmonary nodule in the right upper lobe. Non-contrast chest CT at 6-12 months is recommended. If the nodule is stable at time of repeat CT, then future CT at 18-24 months (from today's scan) is considered optional for low-risk patients, but is recommended for high-risk patients. This recommendation follows the consensus statement: Guidelines for Management of Incidental Pulmonary Nodules Detected on CT Images: From the Fleischner Society 2017; Radiology 2017; 284:228-243. 4. Approximately 3.3 x 2.2 cm discrete anterior mediastinal mass, potentially a proteinaceous thymic cyst given intermediate density. If clinically warranted, nonemergent, outpatient MRI of the chest with and without contrast. 5. Cardiomegaly. 6. Enlarged main pulmonary artery, which can be seen with pulmonary arterial hypertension. 7. Aortic Atherosclerosis (ICD10-I70.0).  ADDENDUM REPORT: 03/24/2021 16:18 ADDENDUM: Impression # 4 was accidentally truncated.  It should read:   Approximately 3.3 x 2.2 cm  discrete anterior mediastinal mass, potentially a proteinaceous thymic cyst given intermediate density. If clinically warranted, nonemergent, outpatient MRI of the chest with and without contrast could further evaluate.  Echo 01/30/20 1. Left ventricular ejection fraction, by estimation, is 65 to 70%. The  left ventricle has normal function. The left ventricle has no regional  wall motion abnormalities. There is severe concentric left ventricular  hypertrophy. Left ventricular diastolic   parameters are consistent with Grade  III diastolic dysfunction  (restrictive). Elevated left atrial pressure.   2. Right ventricular systolic function is mildly reduced. The right  ventricular size is mildly enlarged. There is severely elevated pulmonary  artery systolic pressure. The estimated right ventricular systolic  pressure is 0000000 mmHg.   3. Left atrial size was severely dilated.   4. Right atrial size was severely dilated.   5. The mitral valve is normal in structure. Mild to moderate mitral valve  regurgitation. No evidence of mitral stenosis. Moderate mitral annular  calcification.   6. Tricuspid valve regurgitation is moderate to severe.   7. The aortic valve is normal in structure. There is severe calcifcation  of the aortic valve. There is severe thickening of the aortic valve.  Aortic valve regurgitation is not visualized. Mild aortic valve stenosis.  Aortic valve mean gradient measures  12.0 mmHg.   8. The inferior vena cava is normal in size with greater than 50%  respiratory variability, suggesting right atrial pressure of 3 mmHg.   Carotid Doppler 01/17/20 Right Carotid: Velocities in the right ICA are consistent with a 1-39%  stenosis. The ECA appears >50% stenosed.   Left Carotid: Velocities in the left ICA are consistent with a 1-39%  stenosis.   Vertebrals:  Bilateral vertebral arteries demonstrate antegrade flow.  Subclavians: Right subclavian artery flow was disturbed.  Normal flow hemodynamics were seen in the left subclavian artery.  Tortuous right subclavian artery.    EKG:  EKG is personally reviewed. 03/24/2022:  EKG was not ordered. 08/20/2021:  A-paced, V-sensed. Rate 60 bpm. LVH with repolarization.  Recent Labs: 03/25/2021: Magnesium 2.4 08/12/2021: ALT 11; BUN 34; Creatinine, Ser 1.68; Hemoglobin 12.7; Platelets 252; Potassium 4.7; Sodium 144; TSH 3.290   Recent Lipid Panel    Component Value Date/Time   CHOL  07/04/2009 0418    81        ATP III CLASSIFICATION:  <200     mg/dL   Desirable  200-239  mg/dL   Borderline High  >=240    mg/dL   High          TRIG 64 07/04/2009 0418   HDL 18 (L) 07/04/2009 0418   CHOLHDL 4.5 07/04/2009 0418   VLDL 13 07/04/2009 0418   LDLCALC  07/04/2009 0418    50        Total Cholesterol/HDL:CHD Risk Coronary Heart Disease Risk Table                     Men   Women  1/2 Average Risk   3.4   3.3  Average Risk       5.0   4.4  2 X Average Risk   9.6   7.1  3 X Average Risk  23.4   11.0        Use the calculated Patient Ratio above and the CHD Risk Table to determine the patient's CHD Risk.        ATP III CLASSIFICATION (LDL):  <100     mg/dL   Optimal  100-129  mg/dL   Near or Above                    Optimal  130-159  mg/dL   Borderline  160-189  mg/dL   High  >190     mg/dL   Very High    Physical Exam:      Wt Readings from Last 3 Encounters:  03/24/22 127 lb 8 oz (  57.8 kg)  01/22/22 128 lb 9.6 oz (58.3 kg)  11/21/21 128 lb (58.1 kg)    VS:  BP 134/60 (BP Location: Right Arm, Patient Position: Sitting, Cuff Size: Normal)   Pulse 60   Ht 5' 5.5" (1.664 m)   Wt 127 lb 8 oz (57.8 kg)   SpO2 92%   BMI 20.89 kg/m  , BMI Body mass index is 20.89 kg/m. GENERAL:  Well appearing HEENT: Pupils equal round and reactive, fundi not visualized, oral mucosa unremarkable NECK:  No jugular venous distention, waveform within normal limits, carotid upstroke brisk and symmetric, no carotid  bruits LUNGS:  Clear to auscultation bilaterally HEART:  RRR.  PMI not displaced or sustained,S1 and S2 within normal limits, no S3, no S4, no clicks, no rubs, III/VI systolic murmur at the RUSB ABD:  Flat, positive bowel sounds normal in frequency in pitch, no bruits, no rebound, no guarding, no midline pulsatile mass, no hepatomegaly, no splenomegaly EXT:  2 plus pulses throughout, no edema, no cyanosis no clubbing SKIN:  No rashes no nodules NEURO:  Cranial nerves II through XII grossly intact, motor grossly intact throughout PSYCH:  Cognitively intact, oriented to person place and time   ASSESSMENT:    1. Resistant hypertension   2. PAF (paroxysmal atrial fibrillation) ([place])   3. Aortic valve stenosis, etiology of cardiac valve disease unspecified   4. Acute pulmonary embolism without acute cor pulmonale, unspecified pulmonary embolism type (HCC)     PLAN:    Resistant hypertension Her blood pressure was initially elevated but better on repeat.  In general it has been better controlled.  Will not make any changes at this time.  Continue carvedilol and clonidine.  She will work on increasing her walking now that the weather is improving.  PAF (paroxysmal atrial fibrillation) (HCC) Continue Eliquis, carvedilol, amiodarone, and digoxin.  Currently in sinus rhythm.  Aortic stenosis Mild aortic stenosis.  Mean gradient was 8 mmHg on her recent echo.  She is asymptomatic.  Acute pulmonary embolism (HCC) Continue Eliquis.  Disposition:  FU with APP in 6 months. FU with Cameren Earnest C. Oval Linsey, MD, [facility] in 1 year.  Medication Adjustments/Labs and Tests Ordered: Current medicines are reviewed at length with the patient today.  Concerns regarding medicines are outlined above.   No orders of the defined types were placed in this encounter.  No orders of the defined types were placed in this encounter.  Patient Instructions  Medication Instructions:  Your physician recommends that  you continue on your current medications as directed. Please refer to the Current Medication list given to you today.  *If you need a refill on your cardiac medications before your next appointment, please call your pharmacy*  Lab Work: NONE  Testing/Procedures: NONE  Follow-Up: At [facility], you and your health needs are our priority.  As part of our continuing mission to provide you with exceptional heart care, we have created designated Provider Care Teams.  These Care Teams include your primary Cardiologist (physician) and Advanced Practice Providers (APPs -  Physician Assistants and Nurse Practitioners) who all work together to provide you with the care you need, when you need it.  We recommend signing up for the patient portal called "MyChart".  Sign up information is provided on this After Visit Summary.  MyChart is used to connect with patients for Virtual Visits (Telemedicine).  Patients are able to view lab/test results, encounter notes, upcoming appointments, etc.  Non-urgent messages can be sent to your  provider as well.   To learn more about what you can do with MyChart, go to [email or web address].    Your next appointment:   6 month(s)  Provider:   Laurann Montana, NP    [facility] IN 1 YEAR    I,Mathew Stumpf,acting as a scribe for Skeet Latch, MD.,have documented all relevant documentation on the behalf of Skeet Latch, MD,as directed by  Skeet Latch, MD while in the presence of Skeet Latch, MD.  I, [place] Oval Linsey, MD have reviewed all documentation for this visit.  The documentation of the exam, diagnosis, procedures, and orders on 03/24/2022 are all accurate and complete.  Signed, Skeet Latch, MD  03/24/2022 5:02 PM    [place]

## 2022-03-24 NOTE — Assessment & Plan Note (Signed)
Her blood pressure was initially elevated but better on repeat.  In general it has been better controlled.  Will not make any changes at this time.  Continue carvedilol and clonidine.  She will work on increasing her walking now that the weather is improving.

## 2022-03-25 ENCOUNTER — Ambulatory Visit (INDEPENDENT_AMBULATORY_CARE_PROVIDER_SITE_OTHER): Payer: Medicare Other | Admitting: Student

## 2022-03-25 ENCOUNTER — Encounter: Payer: Self-pay | Admitting: Student

## 2022-03-25 VITALS — BP 142/62 | HR 60 | Temp 98.0°F | Ht 65.5 in | Wt 127.0 lb

## 2022-03-25 DIAGNOSIS — R911 Solitary pulmonary nodule: Secondary | ICD-10-CM | POA: Diagnosis not present

## 2022-03-25 DIAGNOSIS — R053 Chronic cough: Secondary | ICD-10-CM

## 2022-03-25 MED ORDER — MONTELUKAST SODIUM 10 MG PO TABS: 10.0000 mg | ORAL_TABLET | Freq: Every day | ORAL | 11 refills | Status: DC

## 2022-03-25 MED ORDER — LORATADINE 10 MG PO TABS: 10.0000 mg | ORAL_TABLET | Freq: Every day | ORAL | 11 refills | Status: DC

## 2022-03-25 NOTE — Patient Instructions (Addendum)
-   take shower, clear nose of crusting, then flonase 1 spray each nostril everyday at least till we get through pollen season - would try claritin 10 mg daily for a week, if no better then STOP this medication and START singulair 10 mg daily. If feeing better on claritin though, would simply continue claritin at least until next visit.  - symbicort 2 puffs twice daily - rinse mouth and brush teeth/tongue after each use - CT Chest you'll be called to schedule - see you in 8 weeks or sooner if need be!

## 2022-04-10 DIAGNOSIS — G479 Sleep disorder, unspecified: Secondary | ICD-10-CM | POA: Diagnosis not present

## 2022-04-10 DIAGNOSIS — N184 Chronic kidney disease, stage 4 (severe): Secondary | ICD-10-CM | POA: Diagnosis not present

## 2022-04-10 DIAGNOSIS — D649 Anemia, unspecified: Secondary | ICD-10-CM | POA: Diagnosis not present

## 2022-04-10 DIAGNOSIS — F411 Generalized anxiety disorder: Secondary | ICD-10-CM | POA: Diagnosis not present

## 2022-04-10 DIAGNOSIS — K219 Gastro-esophageal reflux disease without esophagitis: Secondary | ICD-10-CM | POA: Diagnosis not present

## 2022-04-10 DIAGNOSIS — M353 Polymyalgia rheumatica: Secondary | ICD-10-CM | POA: Diagnosis not present

## 2022-04-10 DIAGNOSIS — E781 Pure hyperglyceridemia: Secondary | ICD-10-CM | POA: Diagnosis not present

## 2022-04-10 DIAGNOSIS — Z6820 Body mass index (BMI) 20.0-20.9, adult: Secondary | ICD-10-CM | POA: Diagnosis not present

## 2022-04-10 DIAGNOSIS — I10 Essential (primary) hypertension: Secondary | ICD-10-CM | POA: Diagnosis not present

## 2022-04-13 ENCOUNTER — Other Ambulatory Visit: Payer: Self-pay | Admitting: Cardiovascular Disease

## 2022-04-13 DIAGNOSIS — I10 Essential (primary) hypertension: Secondary | ICD-10-CM

## 2022-04-18 ENCOUNTER — Ambulatory Visit (INDEPENDENT_AMBULATORY_CARE_PROVIDER_SITE_OTHER): Payer: Medicare Other

## 2022-04-18 DIAGNOSIS — I495 Sick sinus syndrome: Secondary | ICD-10-CM | POA: Diagnosis not present

## 2022-04-18 LAB — CUP PACEART REMOTE DEVICE CHECK
Battery Remaining Longevity: 78 mo
Battery Remaining Percentage: 100 %
Brady Statistic RA Percent Paced: 98 %
Brady Statistic RV Percent Paced: 6 %
Date Time Interrogation Session: 20240412022100
Implantable Lead Connection Status: 753985
Implantable Lead Connection Status: 753985
Implantable Lead Implant Date: 20210716
Implantable Lead Implant Date: 20210716
Implantable Lead Location: 753859
Implantable Lead Location: 753860
Implantable Lead Model: 7840
Implantable Lead Model: 7841
Implantable Lead Serial Number: 1017229
Implantable Lead Serial Number: 1090224
Implantable Pulse Generator Implant Date: 20210716
Lead Channel Impedance Value: 578 Ohm
Lead Channel Impedance Value: 647 Ohm
Lead Channel Pacing Threshold Amplitude: 0.4 V
Lead Channel Pacing Threshold Amplitude: 1.3 V
Lead Channel Pacing Threshold Pulse Width: 0.4 ms
Lead Channel Pacing Threshold Pulse Width: 0.4 ms
Lead Channel Setting Pacing Amplitude: 2 V
Lead Channel Setting Pacing Amplitude: 2.5 V
Lead Channel Setting Pacing Pulse Width: 0.4 ms
Lead Channel Setting Sensing Sensitivity: 2.5 mV
Pulse Gen Serial Number: 547553
Zone Setting Status: 755011

## 2022-05-06 ENCOUNTER — Ambulatory Visit (HOSPITAL_BASED_OUTPATIENT_CLINIC_OR_DEPARTMENT_OTHER)
Admission: RE | Admit: 2022-05-06 | Discharge: 2022-05-06 | Disposition: A | Discharge status: Home / Self Care | Payer: Medicare Other | Source: Ambulatory Visit | Attending: Student | Admitting: Student

## 2022-05-06 DIAGNOSIS — J181 Lobar pneumonia, unspecified organism: Secondary | ICD-10-CM | POA: Diagnosis not present

## 2022-05-06 DIAGNOSIS — R911 Solitary pulmonary nodule: Secondary | ICD-10-CM

## 2022-05-06 DIAGNOSIS — R918 Other nonspecific abnormal finding of lung field: Secondary | ICD-10-CM | POA: Diagnosis not present

## 2022-05-08 ENCOUNTER — Other Ambulatory Visit (HOSPITAL_COMMUNITY): Payer: Self-pay | Admitting: Internal Medicine

## 2022-05-19 NOTE — Progress Notes (Unsigned)
Synopsis: Referred for cough by Daisy Floro, MD  Subjective:   PATIENT ID: Allison Norman GENDER: female DOB: Apr 12, 1924, MRN: [id number]  No chief complaint on file.  CC: cough  89yF with temporal arteritis, AF, tachy-brady, resistant HTN, PH, PE, AS referred for cough  Coughing up clear stuff since May. Never has had covid-19 infection. She did have some heartburn in past/reflux but she has stopped taking those pills over last 4 months. She does have some sinus congestion/postnasal drainage and was prescribed a spray - she thinks flonase but she doesn't think it was very helpful. She notices this especially after eating. Frequently feels sensation of having a lot of phlegm in her throat. She has not tried inhaler for this.   No DOE relative to peers.   No family history of lung disease  She was a Arts development officer. Smoked 50 ya half ppd for 10-15 years. No MJ, vaping. She has a cat.   Interval HPI:   Stopped ppi, continued pepcid and started symbicort 80 last visit. At first she felt like it may have helped though cough now persists. She does have sinus congestion, postnasal drainage. Has been using flonase for a couple weeks now.   Stable TTE ---------------------------- Encouraged flonase but not using consistently, trial of nonsedating antihistamine and/or singulair  CT Chest with interval multifocal consolidative opacities and TIB, stable 7mm RUL nodule, stable anterior mediastinal mass  Otherwise pertinent review of systems is negative.   Past Medical History:  Diagnosis Date   Aortic stenosis 08/20/2021   Mild-moderate.  Repeat echo in one year.   Atrial fibrillation (HCC) 2017   a. s/p DCCV in 10/2015  b. recurrent in 08/2016 --> rate-control pursued.    Cancer (HCC)    Breast   CKD (chronic kidney disease)    GCA (giant cell arteritis) (HCC)    Hordeolum internum left lower eyelid 06/28/2019   Hypertension    Macular degeneration    Osteoporosis    PMR  (polymyalgia rheumatica) (HCC)    Pulmonary hypertension, unspecified (HCC) 02/14/2021   Resistant hypertension 03/15/2019   Shortness of breath 03/15/2019   Skin cancer      Family History  Problem Relation Age of Onset   Stroke Mother    Kidney failure Father        died at 45   Heart disease Sister        died at 33   Heart disease Sister        died at 50   Breast cancer Daughter      Past Surgical History:  Procedure Laterality Date   CARDIOVERSION N/A 10/18/2015   Procedure: CARDIOVERSION;  Surgeon: Jake Bathe, MD;  Location: [facility] ENDOSCOPY;  Service: Cardiovascular;  Laterality: N/A;   CARDIOVERSION N/A 02/20/2017   Procedure: CARDIOVERSION;  Surgeon: Chrystie Nose, MD;  Location: [facility] ENDOSCOPY;  Service: Cardiovascular;  Laterality: N/A;   CARDIOVERSION N/A 06/18/2017   Procedure: CARDIOVERSION;  Surgeon: Thurmon Fair, MD;  Location: [facility] ENDOSCOPY;  Service: Cardiovascular;  Laterality: N/A;   CARDIOVERSION N/A 12/21/2018   Procedure: CARDIOVERSION;  Surgeon: Vesta Mixer, MD;  Location: [facility] ENDOSCOPY;  Service: Cardiovascular;  Laterality: N/A;   IR CATHETER TUBE CHANGE  02/28/2021   KNEE SURGERY  2013   MASTECTOMY  1998   left side   PACEMAKER IMPLANT N/A 07/22/2019   Procedure: PACEMAKER IMPLANT;  Surgeon: Marinus Maw, MD;  Location: [facility] INVASIVE CV LAB;  Service: Cardiovascular;  Laterality:  N/A;    Social History   Socioeconomic History   Marital status: Married    Spouse name: Not on file   Number of children: 2   Years of education: Not on file   Highest education level: Not on file  Occupational History   Not on file  Tobacco Use   Smoking status: Former    Types: Cigarettes    Quit date: 08/31/1966    Years since quitting: 55.7   Smokeless tobacco: Never  Vaping Use   Vaping Use: Never used  Substance and Sexual Activity   Alcohol use: No   Drug use: No   Sexual activity: Not on file  Other Topics Concern   Not on file  Social History  Narrative   epworth sleepiness scale = 8 (08/31/15)   Social Determinants of Health   Financial Resource Strain: Not on file  Food Insecurity: Not on file  Transportation Needs: Not on file  Physical Activity: Not on file  Stress: Not on file  Social Connections: Not on file  Intimate Partner Violence: Not on file     Allergies  Allergen Reactions   Codeine Itching   Penicillins Itching, Rash and Other (See Comments)    Has patient had a PCN reaction causing immediate rash, facial/tongue/throat swelling, SOB or lightheadedness with hypotension: Yes Has patient had a PCN reaction causing severe rash involving mucus membranes or skin necrosis: No Has patient had a PCN reaction that required hospitalization: No Has patient had a PCN reaction occurring within the last 10 years: No If all of the above answers are "NO", then may proceed with Cephalosporin use.   Doxazosin     Heavy feeling in chest, congestion, wheezing   Doxycycline Other (See Comments)    Cause chest tightness   Hydralazine     Felt bad and could hear pulse in head      Outpatient Medications Prior to Visit  Medication Sig Dispense Refill   acetaminophen (TYLENOL) 500 MG tablet Take 1,000 mg by mouth every 6 (six) hours as needed for moderate pain.     Aflibercept (EYLEA) 2 MG/0.05ML SOLN 2 mg by Intravitreal route every 3 (three) months. Inject in R eye every 11 weeks per Retina specialist for Macular Degenration     amiodarone (PACERONE) 200 MG tablet Take 1/2 (one-half) tablet by mouth once daily 45 tablet 0   apixaban (ELIQUIS) 5 MG TABS tablet Take 1 tablet (5 mg total) by mouth 2 (two) times daily. 60 tablet 11   budesonide-formoterol (SYMBICORT) 80-4.5 MCG/ACT inhaler Inhale 2 puffs into the lungs in the morning and at bedtime. 1 each 12   carvedilol (COREG) 25 MG tablet Take 1 tablet (25 mg total) by mouth 2 (two) times daily with a meal. 180 tablet 3   cloNIDine (CATAPRES) 0.2 MG tablet Take 1 tablet (0.2  mg total) by mouth in the morning and at bedtime. 180 tablet 2   cloNIDine (CATAPRES) 0.3 MG tablet TAKE 0.2MG  BY MOUTH IN THE MORNING , 0.3 MG IN THE AFTERNOON AND 0.2 MG  AT BEDTIME 90 tablet 1   diazepam (VALIUM) 2 MG tablet Take 2 mg by mouth at bedtime as needed for anxiety.  0   digoxin (LANOXIN) 0.125 MG tablet Take 1 tablet (125 mcg total) by mouth every other day. 45 tablet 2   famotidine (PEPCID) 40 MG tablet Take 40 mg by mouth daily.     furosemide (LASIX) 40 MG tablet Take 40 mg by mouth  daily.     loratadine (CLARITIN) 10 MG tablet Take 1 tablet (10 mg total) by mouth daily. 30 tablet 11   Melatonin 10 MG CAPS Take 10 mg by mouth at bedtime.     montelukast (SINGULAIR) 10 MG tablet Take 1 tablet (10 mg total) by mouth at bedtime. 30 tablet 11   Multiple Vitamins-Minerals (PRESERVISION AREDS 2) CAPS Take 1 capsule by mouth daily.     pantoprazole (PROTONIX) 40 MG tablet Take 1 tablet (40 mg total) by mouth daily. 90 tablet 0   polyethylene glycol (MIRALAX / GLYCOLAX) 17 g packet Take 17 g by mouth daily. 14 each 0   predniSONE (DELTASONE) 5 MG tablet Take 5 mg by mouth every other day.     No facility-administered medications prior to visit.       Objective:   Physical Exam:  General appearance: 89 y.o., female, NAD, conversant  Eyes: anicteric sclerae; PERRL, tracking appropriately HENT: NCAT; MMM Neck: Trachea midline; no lymphadenopathy, no JVD Lungs: CTAB, no crackles, no wheeze, with normal respiratory effort CV: RRR, no murmur  Abdomen: Soft, non-tender; non-distended, BS present  Extremities: No peripheral edema, warm Skin: Normal turgor and texture; no rash Psych: Appropriate affect Neuro: Alert and oriented to person and place, no focal deficit     There were no vitals filed for this visit.       on RA BMI Readings from Last 3 Encounters:  03/25/22 20.81 kg/m  03/24/22 20.89 kg/m  01/22/22 21.07 kg/m   Wt Readings from Last 3 Encounters:   03/25/22 127 lb (57.6 kg)  03/24/22 127 lb 8 oz (57.8 kg)  01/22/22 128 lb 9.6 oz (58.3 kg)     CBC    Component Value Date/Time   WBC 7.0 08/12/2021 1548   WBC 8.2 03/28/2021 0423   RBC 4.20 08/12/2021 1548   RBC 3.50 (L) 03/28/2021 0423   HGB 12.7 08/12/2021 1548   HCT 39.1 08/12/2021 1548   PLT 252 08/12/2021 1548   MCV 93 08/12/2021 1548   MCH 30.2 08/12/2021 1548   MCH 30.0 03/28/2021 0423   MCHC 32.5 08/12/2021 1548   MCHC 31.0 03/28/2021 0423   RDW 14.3 08/12/2021 1548   LYMPHSABS 1.2 03/28/2021 0423   LYMPHSABS 1.3 07/05/2019 1048   MONOABS 1.1 (H) 03/28/2021 0423   EOSABS 0.4 03/28/2021 0423   EOSABS 0.3 07/05/2019 1048   BASOSABS 0.0 03/28/2021 0423   BASOSABS 0.0 07/05/2019 1048    Chest Imaging: CTA Chest 03/2021 reviewed by me with RLL segmental PE, ggo/consolidation RUL/bilateral lower lobes, 8mm RUL nodule, 3.3 cm anterior mediastinal mass  Pulmonary Functions Testing Results:    Latest Ref Rng & Units 04/21/2017   12:49 PM  PFT Results  FVC-Pre L 1.85   FVC-Predicted Pre % 78   Pre FEV1/FVC % % 79   FEV1-Pre L 1.46   FEV1-Predicted Pre % 83   DLCO uncorrected ml/min/mmHg 15.07   DLCO UNC% % 55   DLVA Predicted % 88   TLC L 5.46   TLC % Predicted % 102   RV % Predicted % 130   PFT 2019 with moderately reduced diffusing capacity    Echocardiogram:   TTE 03/20/22:  1. Left ventricular ejection fraction, by estimation, is 65 to 70%. The  left ventricle has normal function. The left ventricle has no regional  wall motion abnormalities. There is mild concentric left ventricular  hypertrophy. Left ventricular diastolic  parameters are consistent with Grade II diastolic  dysfunction  (pseudonormalization). The average left ventricular global longitudinal  strain is -16.8 %. The global longitudinal strain is abnormal.   2. Right ventricular systolic function is normal. The right ventricular  size is normal. There is normal pulmonary artery  systolic pressure. The  estimated right ventricular systolic pressure is 35.2 mmHg.   3. Left atrial size was severely dilated.   4. The mitral valve is degenerative. Mild to moderate mitral valve  regurgitation. No evidence of mitral stenosis.   5. The aortic valve is tricuspid. There is moderate calcification of the  aortic valve. There is moderate thickening of the aortic valve. Aortic  valve regurgitation is not visualized. Mild aortic valve stenosis. Aortic  valve mean gradient measures 8.0  mmHg. Aortic valve Vmax measures 1.90 m/s.   6. The inferior vena cava is dilated in size with >50% respiratory  variability, suggesting right atrial pressure of 8 mmHg.       Assessment & Plan:   # chronic cough With frequent throat clearing, association with eating and nocturnal sx, onset around time of discontinuation of ppi strong suspicion for LPR/irritable larynx. Think sinus congestion/PND may be contributory but she has been reluctant to stick with flonase.  # anterior mediastinal mass  # 8mm RUL pulmonary nodule  Plan: - take shower, clear nose of crusting, then flonase 1 spray each nostril everyday at least till we get through pollen season - would try claritin 10 mg daily for a week, if no better then STOP this medication and START singulair 10 mg daily. If feeing better on claritin though, would simply continue claritin at least until next visit.  - symbicort 2 puffs twice daily - rinse mouth and brush teeth/tongue after each use - CT Chest you'll be called to schedule - if cough no better next visit consider referral to ENT - see you in 8 weeks or sooner if need be!     Omar Person, MD [facility] 05/19/2022 12:38 PM

## 2022-05-21 ENCOUNTER — Ambulatory Visit (INDEPENDENT_AMBULATORY_CARE_PROVIDER_SITE_OTHER): Payer: Medicare Other | Admitting: Student

## 2022-05-21 ENCOUNTER — Encounter: Payer: Self-pay | Admitting: Student

## 2022-05-21 VITALS — BP 130/74 | HR 60 | Temp 97.9°F | Ht 65.5 in | Wt 125.0 lb

## 2022-05-21 DIAGNOSIS — J479 Bronchiectasis, uncomplicated: Secondary | ICD-10-CM | POA: Diagnosis not present

## 2022-05-21 DIAGNOSIS — R918 Other nonspecific abnormal finding of lung field: Secondary | ICD-10-CM

## 2022-05-21 NOTE — Patient Instructions (Addendum)
-   sputum cultures  - dextromethorphan as needed for cough - if coughing up a lot of phlegm again let us know, may need to consider prolonged course of antibiotics (2 weeks or so) - take shower, clear nose of crusting, then flonase 1 spray each nostril everyday at least till we get through pollen season - try montelukast for postnasal drainage - stop symbicort, ineffective and LABA/ICS may increase risk for pneumonia especially in setting of your bronchiectasis - CT Chest in a year for pulmonary nodules - See you in 3 months or sooner if need be

## 2022-05-22 DIAGNOSIS — H35351 Cystoid macular degeneration, right eye: Secondary | ICD-10-CM | POA: Diagnosis not present

## 2022-05-22 DIAGNOSIS — H353113 Nonexudative age-related macular degeneration, right eye, advanced atrophic without subfoveal involvement: Secondary | ICD-10-CM | POA: Diagnosis not present

## 2022-05-22 DIAGNOSIS — H353124 Nonexudative age-related macular degeneration, left eye, advanced atrophic with subfoveal involvement: Secondary | ICD-10-CM | POA: Diagnosis not present

## 2022-05-22 DIAGNOSIS — H353211 Exudative age-related macular degeneration, right eye, with active choroidal neovascularization: Secondary | ICD-10-CM | POA: Diagnosis not present

## 2022-05-22 NOTE — Progress Notes (Signed)
Remote pacemaker transmission.   

## 2022-05-30 ENCOUNTER — Other Ambulatory Visit (HOSPITAL_BASED_OUTPATIENT_CLINIC_OR_DEPARTMENT_OTHER): Payer: Self-pay | Admitting: Family

## 2022-05-30 DIAGNOSIS — I10 Essential (primary) hypertension: Secondary | ICD-10-CM

## 2022-07-01 DIAGNOSIS — H353113 Nonexudative age-related macular degeneration, right eye, advanced atrophic without subfoveal involvement: Secondary | ICD-10-CM | POA: Diagnosis not present

## 2022-07-01 DIAGNOSIS — H353211 Exudative age-related macular degeneration, right eye, with active choroidal neovascularization: Secondary | ICD-10-CM | POA: Diagnosis not present

## 2022-07-01 DIAGNOSIS — H35351 Cystoid macular degeneration, right eye: Secondary | ICD-10-CM | POA: Diagnosis not present

## 2022-07-01 DIAGNOSIS — H353124 Nonexudative age-related macular degeneration, left eye, advanced atrophic with subfoveal involvement: Secondary | ICD-10-CM | POA: Diagnosis not present

## 2022-07-18 ENCOUNTER — Ambulatory Visit (INDEPENDENT_AMBULATORY_CARE_PROVIDER_SITE_OTHER): Payer: Medicare Other

## 2022-07-18 DIAGNOSIS — I495 Sick sinus syndrome: Secondary | ICD-10-CM

## 2022-07-18 LAB — CUP PACEART REMOTE DEVICE CHECK
Battery Remaining Longevity: 72 mo
Battery Remaining Percentage: 100 %
Brady Statistic RA Percent Paced: 98 %
Brady Statistic RV Percent Paced: 8 %
Date Time Interrogation Session: 20240712022100
Implantable Lead Connection Status: 753985
Implantable Lead Connection Status: 753985
Implantable Lead Implant Date: 20210716
Implantable Lead Implant Date: 20210716
Implantable Lead Location: 753859
Implantable Lead Location: 753860
Implantable Lead Model: 7840
Implantable Lead Model: 7841
Implantable Lead Serial Number: 1017229
Implantable Lead Serial Number: 1090224
Implantable Pulse Generator Implant Date: 20210716
Lead Channel Impedance Value: 587 Ohm
Lead Channel Impedance Value: 619 Ohm
Lead Channel Pacing Threshold Amplitude: 0.7 V
Lead Channel Pacing Threshold Amplitude: 1.1 V
Lead Channel Pacing Threshold Pulse Width: 0.4 ms
Lead Channel Pacing Threshold Pulse Width: 0.4 ms
Lead Channel Setting Pacing Amplitude: 2 V
Lead Channel Setting Pacing Amplitude: 2.5 V
Lead Channel Setting Pacing Pulse Width: 0.4 ms
Lead Channel Setting Sensing Sensitivity: 2.5 mV
Pulse Gen Serial Number: 547553
Zone Setting Status: 755011

## 2022-07-28 DIAGNOSIS — R229 Localized swelling, mass and lump, unspecified: Secondary | ICD-10-CM | POA: Diagnosis not present

## 2022-07-29 DIAGNOSIS — C44629 Squamous cell carcinoma of skin of left upper limb, including shoulder: Secondary | ICD-10-CM | POA: Diagnosis not present

## 2022-07-29 DIAGNOSIS — D485 Neoplasm of uncertain behavior of skin: Secondary | ICD-10-CM | POA: Diagnosis not present

## 2022-07-29 DIAGNOSIS — Z85828 Personal history of other malignant neoplasm of skin: Secondary | ICD-10-CM | POA: Diagnosis not present

## 2022-08-04 ENCOUNTER — Other Ambulatory Visit: Payer: Self-pay | Admitting: Family

## 2022-08-04 NOTE — Telephone Encounter (Signed)
Rx request sent to pharmacy.  

## 2022-08-04 NOTE — Progress Notes (Signed)
Remote pacemaker transmission.   

## 2022-08-07 ENCOUNTER — Telehealth: Payer: Self-pay | Admitting: Cardiovascular Disease

## 2022-08-07 NOTE — Telephone Encounter (Signed)
   Pre-operative Risk Assessment    Patient Name: Allison Norman  DOB: 1924-06-16 MRN: [id number]      Request for Surgical Clearance    Procedure:   MOHS (removing skin cancer on left hand)  Date of Surgery:  Clearance 08/25/22                                 Surgeon:  Dr. Mathews Robinsons Surgeon's Group or Practice Name:  [facility] Phone number:  [phone] Fax number:  [phone]   Type of Clearance Requested:   - Medical  - Pharmacy:  Hold Apixaban (Eliquis)     Type of Anesthesia:  Local    Additional requests/questions:      SignedFilomena Jungling   08/07/2022, 11:34 AM

## 2022-08-07 NOTE — Telephone Encounter (Signed)
Please advise holding Eliquis prior to MOHS left hand scheduled for 08/25/2022.   Thank you!  DW

## 2022-08-08 ENCOUNTER — Telehealth: Payer: Self-pay

## 2022-08-08 NOTE — Telephone Encounter (Signed)
Patient with diagnosis of atrial fibrillation and prior PE on Eliquis for anticoagulation.    Procedure:   MOHS (removing skin cancer on left hand)   Date of Surgery:  Clearance 08/25/22    CHA2DS2-VASc Score = 5   This indicates a 7.2% annual risk of stroke. The patient's score is based upon: CHF History: 1 HTN History: 1 Diabetes History: 0 Stroke History: 0 Vascular Disease History: 0 Age Score: 2 Gender Score: 1   Patient has history of "non-occlusive PE" 03/2021  CrCl 19 Platelet count 252  Per office protocol, patient can hold Eliquis for 1 days prior to procedure.   Patient will not need bridging with Lovenox (enoxaparin) around procedure.  **This guidance is not considered finalized until pre-operative APP has relayed final recommendations.**

## 2022-08-08 NOTE — Telephone Encounter (Signed)
   Name: Allison Norman  DOB: August 03, 1924  MRN: [id number]  Primary Cardiologist: Chilton Si, MD   Preoperative team, please contact this patient and set up a phone call appointment for further preoperative risk assessment. Please obtain consent and complete medication review. Thank you for your help.  I confirm that guidance regarding antiplatelet and oral anticoagulation therapy has been completed and, if necessary, noted below.  Per pharm D, may hold Eliquis 1 day prior to procedure. Will not need bridging.    Carlos Levering, NP 08/08/2022, 11:36 AM [facility]

## 2022-08-08 NOTE — Telephone Encounter (Signed)
Pt is scheduled for tele 08/14 at 2:40 pm. Med rec and consent done.    Patient Consent for Virtual Visit        Allison Norman has provided verbal consent on 08/08/2022 for a virtual visit (video or telephone).   CONSENT FOR VIRTUAL VISIT FOR:  Allison Norman  By participating in this virtual visit I agree to the following:  I hereby voluntarily request, consent and authorize [facility] and its employed or contracted physicians, physician assistants, nurse practitioners or other licensed health care professionals (the Practitioner), to provide me with telemedicine health care services (the "Services") as deemed necessary by the treating Practitioner. I acknowledge and consent to receive the Services by the Practitioner via telemedicine. I understand that the telemedicine visit will involve communicating with the Practitioner through live audiovisual communication technology and the disclosure of certain medical information by electronic transmission. I acknowledge that I have been given the opportunity to request an in-person assessment or other available alternative prior to the telemedicine visit and am voluntarily participating in the telemedicine visit.  I understand that I have the right to withhold or withdraw my consent to the use of telemedicine in the course of my care at any time, without affecting my right to future care or treatment, and that the Practitioner or I may terminate the telemedicine visit at any time. I understand that I have the right to inspect all information obtained and/or recorded in the course of the telemedicine visit and may receive copies of available information for a reasonable fee.  I understand that some of the potential risks of receiving the Services via telemedicine include:  Delay or interruption in medical evaluation due to technological equipment failure or disruption; Information transmitted may not be sufficient (e.g. poor resolution of  images) to allow for appropriate medical decision making by the Practitioner; and/or  In rare instances, security protocols could fail, causing a breach of personal health information.  Furthermore, I acknowledge that it is my responsibility to provide information about my medical history, conditions and care that is complete and accurate to the best of my ability. I acknowledge that Practitioner's advice, recommendations, and/or decision may be based on factors not within their control, such as incomplete or inaccurate data provided by me or distortions of diagnostic images or specimens that may result from electronic transmissions. I understand that the practice of medicine is not an exact science and that Practitioner makes no warranties or guarantees regarding treatment outcomes. I acknowledge that a copy of this consent can be made available to me via my patient portal [facility] MyChart), or I can request a printed copy by calling the office of [facility].    I understand that my insurance will be billed for this visit.   I have read or had this consent read to me. I understand the contents of this consent, which adequately explains the benefits and risks of the Services being provided via telemedicine.  I have been provided ample opportunity to ask questions regarding this consent and the Services and have had my questions answered to my satisfaction. I give my informed consent for the services to be provided through the use of telemedicine in my medical care

## 2022-08-08 NOTE — Telephone Encounter (Signed)
Pt is scheduled for tele 08/14 at 2:40 pm. Med rec and consent done.

## 2022-08-18 DIAGNOSIS — H35351 Cystoid macular degeneration, right eye: Secondary | ICD-10-CM | POA: Diagnosis not present

## 2022-08-18 DIAGNOSIS — H353211 Exudative age-related macular degeneration, right eye, with active choroidal neovascularization: Secondary | ICD-10-CM | POA: Diagnosis not present

## 2022-08-18 DIAGNOSIS — H353124 Nonexudative age-related macular degeneration, left eye, advanced atrophic with subfoveal involvement: Secondary | ICD-10-CM | POA: Diagnosis not present

## 2022-08-18 DIAGNOSIS — H353113 Nonexudative age-related macular degeneration, right eye, advanced atrophic without subfoveal involvement: Secondary | ICD-10-CM | POA: Diagnosis not present

## 2022-08-19 ENCOUNTER — Other Ambulatory Visit (HOSPITAL_BASED_OUTPATIENT_CLINIC_OR_DEPARTMENT_OTHER): Payer: Self-pay | Admitting: Cardiovascular Disease

## 2022-08-19 DIAGNOSIS — I480 Paroxysmal atrial fibrillation: Secondary | ICD-10-CM

## 2022-08-19 NOTE — Telephone Encounter (Signed)
Hx of afib which she qualifies for reduced Eliquis dose for, however she also had a PE 03/2021 while on Eliquis. Her dose was increased at that time. Per APP note last September, pt to be on 5mg  BID dose for at least 6 months for PE treatment (she has completed this) but wanted to defer to hematology after for dosing - "since she had a PE while on Eliquis, they may want her to continue higher dose indefinitely." I don't see that she's seen hematology since her PE but she has followed with pulmonology. Could see if they would weigh in, otherwise would have Dr Duke Salvia weigh in.

## 2022-08-19 NOTE — Telephone Encounter (Signed)
Please review for refill. Thank you! 

## 2022-08-19 NOTE — Telephone Encounter (Signed)
Prescription refill request for Eliquis received. Indication: Afib  Last office visit: 03/24/22 Duke Salvia)  Scr: 1.56 (04/10/22 via [facility])  Age: 89 Weight: 56.7kg   Per dosing criteria, current dose is not appropriate. Will forward to PharmD to review.

## 2022-08-20 ENCOUNTER — Ambulatory Visit: Payer: Medicare Other | Attending: Internal Medicine

## 2022-08-20 DIAGNOSIS — Z01810 Encounter for preprocedural cardiovascular examination: Secondary | ICD-10-CM | POA: Diagnosis not present

## 2022-08-20 NOTE — Progress Notes (Signed)
Virtual Visit via Telephone Note   Because of [name] co-morbid illnesses, she is at least at moderate risk for complications without adequate follow up.  This format is felt to be most appropriate for this patient at this time.  The patient did not have access to video technology/had technical difficulties with video requiring transitioning to audio format only (telephone).  All issues noted in this document were discussed and addressed.  No physical exam could be performed with this format.  Please refer to the patient's chart for her consent to telehealth for [name].  Evaluation Performed:  Preoperative cardiovascular risk assessment _____________   Date:  08/20/2022   Patient ID:  Allison Norman, DOB 04-27-1924, MRN [id number] Patient Location:  Home Provider location:   Office  Primary Care Provider:  Daisy Floro, MD Primary Cardiologist:  Chilton Si, MD  Chief Complaint / Patient Profile   89 y.o. y/o female with a h/o mild aortic stenosis per recent echocardiogram 3/24, atrial fibrillation status post DCCV in 2017 with reoccurrence in 2018, rate control pursued on chronic anticoagulation, hypertension, pulmonary hypertension, chronic shortness of breath, breast cancer who is pending Mohs surgery on her left hand for cancer and presents today for telephonic preoperative cardiovascular risk assessment.  History of Present Illness    Allison Norman is a 89 y.o. female who presents via audio/video conferencing for a telehealth visit today.  Pt was last seen in cardiology clinic on 03/24/2022 by Dr. Duke Salvia.  At that time Allison Norman was doing well .  The patient is now pending procedure as outlined above. Since her last visit, she tells me she has not had any issues with chest pains or shortness of breath.  She uses her walker to get around and she thinks she can walk 2 blocks with her walker.  No stairs in her home.  She does not like doing  stairs but does feel like she could do 5 or 6 stairs.  She has caregivers during the day that help her with her day-to-day tasks.  For this reason she has surpassed a 4 METS on the DASI.  She is taking 3 mg of Eliquis twice a day since she has a history of pulmonary embolism on a lower dose.  Per office protocol, patient can hold Eliquis for 1 days prior to procedure.   Patient will not need bridging with Lovenox (enoxaparin) around procedure.  Please resume Eliquis when medically safe to do so.  Past Medical History    Past Medical History:  Diagnosis Date   Aortic stenosis 08/20/2021   Mild-moderate.  Repeat echo in one year.   Atrial fibrillation (HCC) 2017   a. s/p DCCV in 10/2015  b. recurrent in 08/2016 --> rate-control pursued.    Cancer (HCC)    Breast   CKD (chronic kidney disease)    GCA (giant cell arteritis) (HCC)    Hordeolum internum left lower eyelid 06/28/2019   Hypertension    Macular degeneration    Osteoporosis    PMR (polymyalgia rheumatica) (HCC)    Pulmonary hypertension, unspecified (HCC) 02/14/2021   Resistant hypertension 03/15/2019   Shortness of breath 03/15/2019   Skin cancer    Past Surgical History:  Procedure Laterality Date   CARDIOVERSION N/A 10/18/2015   Procedure: CARDIOVERSION;  Surgeon: Jake Bathe, MD;  Location: [facility] ENDOSCOPY;  Service: Cardiovascular;  Laterality: N/A;   CARDIOVERSION N/A 02/20/2017   Procedure: CARDIOVERSION;  Surgeon: Chrystie Nose,  MD;  Location: [facility] ENDOSCOPY;  Service: Cardiovascular;  Laterality: N/A;   CARDIOVERSION N/A 06/18/2017   Procedure: CARDIOVERSION;  Surgeon: Thurmon Fair, MD;  Location: [facility] ENDOSCOPY;  Service: Cardiovascular;  Laterality: N/A;   CARDIOVERSION N/A 12/21/2018   Procedure: CARDIOVERSION;  Surgeon: Vesta Mixer, MD;  Location[facility]COPY;  Service: Cardiovascular;  Laterality: N/A;   IR CATHETER TUBE CHANGE  02/28/2021   KNEE SURGERY  2013   MASTECTOMY  1998   left side   PACEMAKER IMPLANT  N/A 07/22/2019   Procedure: PACEMAKER IMPLANT;  Surgeon: Marinus Maw, MD;  Location: [facility] INVASIVE CV LAB;  Service: Cardiovascular;  Laterality: N/A;    Allergies  Allergies  Allergen Reactions   Codeine Itching   Penicillins Itching, Rash and Other (See Comments)    Has patient had a PCN reaction causing immediate rash, facial/tongue/throat swelling, SOB or lightheadedness with hypotension: Yes Has patient had a PCN reaction causing severe rash involving mucus membranes or skin necrosis: No Has patient had a PCN reaction that required hospitalization: No Has patient had a PCN reaction occurring within the last 10 years: No If all of the above answers are "NO", then may proceed with Cephalosporin use.   Doxazosin     Heavy feeling in chest, congestion, wheezing   Doxycycline Other (See Comments)    Cause chest tightness   Hydralazine     Felt bad and could hear pulse in head     Home Medications    Prior to Admission medications   Medication Sig Start Date End Date Taking? Authorizing Provider  acetaminophen (TYLENOL) 500 MG tablet Take 1,000 mg by mouth every 6 (six) hours as needed for moderate pain.    [provider]  Aflibercept (EYLEA) 2 MG/0.05ML SOLN 2 mg by Intravitreal route every 3 (three) months. Inject in R eye every 11 weeks per Retina specialist for Macular Degenration    [provider]  amiodarone (PACERONE) 200 MG tablet Take 1/2 (one-half) tablet by mouth once daily 05/08/22   Marinus Maw, MD  apixaban (ELIQUIS) 5 MG TABS tablet Take 1 tablet (5 mg total) by mouth 2 (two) times daily. 09/04/21   Chilton Si, MD  budesonide-formoterol [facility]) 80-4.5 MCG/ACT inhaler Inhale 2 puffs into the lungs in the morning and at bedtime. 01/23/22   Omar Person, MD  carvedilol (COREG) 25 MG tablet Take 1 tablet (25 mg total) by mouth 2 (two) times daily with a meal. 08/20/21 08/15/22  Chilton Si, MD  cloNIDine (CATAPRES) 0.2 MG tablet Take  1 tablet (0.2 mg total) by mouth in the morning and at bedtime. 04/14/22   Alver Sorrow, NP  cloNIDine (CATAPRES) 0.3 MG tablet TAKE 0.2 MG TABLET BY MOUTH IN THE MORNING, 0.3 MG IN THE AFTERNOON, 0.2 MG AT BEDTIME 05/30/22   Alver Sorrow, NP  diazepam (VALIUM) 2 MG tablet Take 2 mg by mouth at bedtime as needed for anxiety. 06/23/16   [provider]  digoxin (LANOXIN) 0.125 MG tablet TAKE 1 TABLET BY MOUTH EVERY OTHER DAY 08/04/22   Chilton Si, MD  famotidine (PEPCID) 40 MG tablet Take 40 mg by mouth daily.    [provider]  furosemide (LASIX) 40 MG tablet Take 40 mg by mouth daily.    [provider]  loratadine (CLARITIN) 10 MG tablet Take 1 tablet (10 mg total) by mouth daily. 03/25/22   Omar Person, MD  Melatonin 10 MG CAPS Take 10 mg by mouth  at bedtime.    [provider]  montelukast (SINGULAIR) 10 MG tablet Take 1 tablet (10 mg total) by mouth at bedtime. 03/25/22   Omar Person, MD  Multiple Vitamins-Minerals (PRESERVISION AREDS 2) CAPS Take 1 capsule by mouth daily.    [provider]  pantoprazole (PROTONIX) 40 MG tablet Take 1 tablet (40 mg total) by mouth daily. 09/20/21   Omar Person, MD  polyethylene glycol (MIRALAX / GLYCOLAX) 17 g packet Take 17 g by mouth daily. 09/09/20   Erick Blinks, MD  predniSONE (DELTASONE) 5 MG tablet Take 5 mg by mouth every other day. 12/03/21   [provider]    Physical Exam    Vital Signs:  Allison Norman does not have vital signs available for review today.  Given telephonic nature of communication, physical exam is limited. AAOx3. NAD. Normal affect.  Speech and respirations are unlabored.  Accessory Clinical Findings    None  Assessment & Plan    1.  Preoperative Cardiovascular Risk Assessment:  Allison Norman perioperative risk of a major cardiac event is 0.4% according to the Revised Cardiac Risk Index (RCRI).  Therefore, she is at low risk for  perioperative complications.   Her functional capacity is good at 4.64 METs according to the [facility] Activity Status Index (DASI). Recommendations: According to ACC/AHA guidelines, no further cardiovascular testing needed.  The patient may proceed to surgery at acceptable risk.   Antiplatelet and/or Anticoagulation Recommendations:  Eliquis (Apixaban) can be held for 1 days prior to surgery.  Please resume post op when felt to be safe.     The patient was advised that if she develops new symptoms prior to surgery to contact our office to arrange for a follow-up visit, and she verbalized understanding.   A copy of this note will be routed to requesting surgeon.  Time:   Today, I have spent 9 minutes with the patient with telehealth technology discussing medical history, symptoms, and management plan.     Sharlene Dory, PA-C  08/20/2022, 2:41 PM

## 2022-08-25 MED ORDER — APIXABAN 2.5 MG PO TABS
2.5000 mg | ORAL_TABLET | Freq: Two times a day (BID) | ORAL | 5 refills | Status: DC
Start: 2022-08-25 — End: 2023-02-23

## 2022-08-25 NOTE — Telephone Encounter (Signed)
Chilton Si, MD   08/21/22 12:52 PM I recommend 2.5mg .  TCR   Called pt and advised that eliquis dose was being reduced from eliquis 5mg  twice a day to 2.5mg  twice a day. She verbalized understanding and will pick up soon.

## 2022-09-02 DIAGNOSIS — Z85828 Personal history of other malignant neoplasm of skin: Secondary | ICD-10-CM | POA: Diagnosis not present

## 2022-09-02 DIAGNOSIS — C44629 Squamous cell carcinoma of skin of left upper limb, including shoulder: Secondary | ICD-10-CM | POA: Diagnosis not present

## 2022-09-04 ENCOUNTER — Ambulatory Visit: Payer: Medicare Other | Admitting: Physician Assistant

## 2022-09-09 DIAGNOSIS — H353211 Exudative age-related macular degeneration, right eye, with active choroidal neovascularization: Secondary | ICD-10-CM | POA: Diagnosis not present

## 2022-09-09 DIAGNOSIS — H353124 Nonexudative age-related macular degeneration, left eye, advanced atrophic with subfoveal involvement: Secondary | ICD-10-CM | POA: Diagnosis not present

## 2022-09-09 DIAGNOSIS — H35351 Cystoid macular degeneration, right eye: Secondary | ICD-10-CM | POA: Diagnosis not present

## 2022-09-09 DIAGNOSIS — H353113 Nonexudative age-related macular degeneration, right eye, advanced atrophic without subfoveal involvement: Secondary | ICD-10-CM | POA: Diagnosis not present

## 2022-09-22 ENCOUNTER — Other Ambulatory Visit (HOSPITAL_BASED_OUTPATIENT_CLINIC_OR_DEPARTMENT_OTHER): Payer: Self-pay | Admitting: Cardiovascular Disease

## 2022-09-25 ENCOUNTER — Ambulatory Visit (HOSPITAL_BASED_OUTPATIENT_CLINIC_OR_DEPARTMENT_OTHER): Payer: Medicare Other | Admitting: Family

## 2022-10-08 ENCOUNTER — Emergency Department (HOSPITAL_BASED_OUTPATIENT_CLINIC_OR_DEPARTMENT_OTHER)
Admission: EM | Admit: 2022-10-08 | Discharge: 2022-10-08 | Disposition: A | Discharge status: Home / Self Care | Payer: Medicare Other | Attending: Emergency Medicine | Admitting: Emergency Medicine

## 2022-10-08 ENCOUNTER — Emergency Department (HOSPITAL_BASED_OUTPATIENT_CLINIC_OR_DEPARTMENT_OTHER): Payer: Medicare Other

## 2022-10-08 ENCOUNTER — Encounter (HOSPITAL_BASED_OUTPATIENT_CLINIC_OR_DEPARTMENT_OTHER): Payer: Self-pay

## 2022-10-08 ENCOUNTER — Other Ambulatory Visit: Payer: Self-pay

## 2022-10-08 DIAGNOSIS — I159 Secondary hypertension, unspecified: Secondary | ICD-10-CM

## 2022-10-08 DIAGNOSIS — Z7901 Long term (current) use of anticoagulants: Secondary | ICD-10-CM | POA: Insufficient documentation

## 2022-10-08 DIAGNOSIS — I10 Essential (primary) hypertension: Secondary | ICD-10-CM | POA: Insufficient documentation

## 2022-10-08 DIAGNOSIS — I517 Cardiomegaly: Secondary | ICD-10-CM | POA: Diagnosis not present

## 2022-10-08 LAB — CBC WITH DIFFERENTIAL/PLATELET
Abs Immature Granulocytes: 0.02 10*3/uL (ref 0.00–0.07)
Basophils Absolute: 0 10*3/uL (ref 0.0–0.1)
Basophils Relative: 0 %
Eosinophils Absolute: 0.2 10*3/uL (ref 0.0–0.5)
Eosinophils Relative: 3 %
HCT: 39.4 % (ref 36.0–46.0)
Hemoglobin: 12.4 g/dL (ref 12.0–15.0)
Immature Granulocytes: 0 %
Lymphocytes Relative: 23 %
Lymphs Abs: 1.8 10*3/uL (ref 0.7–4.0)
MCH: 30.1 pg (ref 26.0–34.0)
MCHC: 31.5 g/dL (ref 30.0–36.0)
MCV: 95.6 fL (ref 80.0–100.0)
Monocytes Absolute: 0.8 10*3/uL (ref 0.1–1.0)
Monocytes Relative: 10 %
Neutro Abs: 4.8 10*3/uL (ref 1.7–7.7)
Neutrophils Relative %: 64 %
Platelets: 211 10*3/uL (ref 150–400)
RBC: 4.12 MIL/uL (ref 3.87–5.11)
RDW: 15.3 % (ref 11.5–15.5)
WBC: 7.6 10*3/uL (ref 4.0–10.5)
nRBC: 0 % (ref 0.0–0.2)

## 2022-10-08 LAB — BASIC METABOLIC PANEL
Anion gap: 9 (ref 5–15)
BUN: 29 mg/dL — ABNORMAL HIGH (ref 8–23)
CO2: 30 mmol/L (ref 22–32)
Calcium: 8.6 mg/dL — ABNORMAL LOW (ref 8.9–10.3)
Chloride: 99 mmol/L (ref 98–111)
Creatinine, Ser: 1.57 mg/dL — ABNORMAL HIGH (ref 0.44–1.00)
GFR, Estimated: 30 mL/min — ABNORMAL LOW (ref >60)
Glucose, Bld: 114 mg/dL — ABNORMAL HIGH (ref 70–99)
Potassium: 4.2 mmol/L (ref 3.5–5.1)
Sodium: 138 mmol/L (ref 135–145)

## 2022-10-08 MED ORDER — CARVEDILOL 12.5 MG PO TABS
25.0000 mg | ORAL_TABLET | Freq: Once | ORAL | Status: AC
  Administered 2022-10-08: 25 mg via ORAL
  Filled 2022-10-08: qty 2

## 2022-10-08 MED ORDER — MELATONIN 5 MG PO TABS
10.0000 mg | ORAL_TABLET | Freq: Once | ORAL | Status: DC
  Filled 2022-10-08: qty 2

## 2022-10-08 MED ORDER — CLONIDINE HCL 0.1 MG PO TABS: 0.3000 mg | ORAL_TABLET | Freq: Once | ORAL | Status: DC

## 2022-10-08 MED ORDER — APIXABAN 2.5 MG PO TABS
2.5000 mg | ORAL_TABLET | Freq: Once | ORAL | Status: AC
  Administered 2022-10-08: 2.5 mg via ORAL
  Filled 2022-10-08: qty 1

## 2022-10-08 MED ORDER — HYDRALAZINE HCL 20 MG/ML IJ SOLN
5.0000 mg | Freq: Once | INTRAMUSCULAR | Status: AC
  Administered 2022-10-08: 5 mg via INTRAVENOUS
  Filled 2022-10-08: qty 1

## 2022-10-08 NOTE — ED Notes (Signed)
Patient ambulated to room 5 using personal walker. Patient reports she regularly checks her blood pressure at home and noticed it was high, so she came to the ED. Patient denies any weakness, dizziness, vision changes, chest pain, or shortness of breath. Patient reports "I am just getting over a cold so my right ear hurts". Patient in NAD.

## 2022-10-08 NOTE — ED Provider Notes (Signed)
[place] EMERGENCY DEPARTMENT AT [facility] Provider Note   CSN: [id number] Arrival date & time: 10/08/22  1927     History  Chief Complaint  Patient presents with   Hypertension    Allison Norman is a 89 y.o. female.  HPI   89 year old female presents emergency department with concern for hypertension.  Patient states that she typically checks it once a day but she has not for the past couple days.  When she checked it this morning the systolic was over 200.  Typically she states her top numbers around 1 50-1 60.  She is otherwise been feeling well and baseline.  She denies any headache, vision changes, neurosymptoms, chest pain, shortness of breath, leg swelling.  She states has been eating and drinking at baseline and compliant with medications.  She has no acute complaints other than the elevated blood pressure.  Home Medications Prior to Admission medications   Medication Sig Start Date End Date Taking? Authorizing Provider  acetaminophen (TYLENOL) 500 MG tablet Take 1,000 mg by mouth every 6 (six) hours as needed for moderate pain.    [provider]  Aflibercept (EYLEA) 2 MG/0.05ML SOLN 2 mg by Intravitreal route every 3 (three) months. Inject in R eye every 11 weeks per Retina specialist for Macular Degenration    [provider]  amiodarone (PACERONE) 200 MG tablet Take 1/2 (one-half) tablet by mouth once daily 05/08/22   Marinus Maw, MD  apixaban (ELIQUIS) 2.5 MG TABS tablet Take 1 tablet (2.5 mg total) by mouth 2 (two) times daily. 08/25/22   Chilton Si, MD  budesonide-formoterol [facility]) 80-4.5 MCG/ACT inhaler Inhale 2 puffs into the lungs in the morning and at bedtime. 01/23/22   Omar Person, MD  carvedilol (COREG) 25 MG tablet TAKE 1 TABLET BY MOUTH TWICE DAILY WITH A MEAL 09/22/22   Chilton Si, MD  cloNIDine (CATAPRES) 0.2 MG tablet Take 1 tablet (0.2 mg total) by mouth in the morning and at bedtime. 04/14/22   Alver Sorrow, NP  cloNIDine (CATAPRES) 0.3 MG tablet TAKE 0.2 MG TABLET BY MOUTH IN THE MORNING, 0.3 MG IN THE AFTERNOON, 0.2 MG AT BEDTIME 05/30/22   Alver Sorrow, NP  diazepam (VALIUM) 2 MG tablet Take 2 mg by mouth at bedtime as needed for anxiety. 06/23/16   [provider]  digoxin (LANOXIN) 0.125 MG tablet TAKE 1 TABLET BY MOUTH EVERY OTHER DAY 08/04/22   Chilton Si, MD  famotidine (PEPCID) 40 MG tablet Take 40 mg by mouth daily.    [provider]  furosemide (LASIX) 40 MG tablet Take 40 mg by mouth daily.    [provider]  loratadine (CLARITIN) 10 MG tablet Take 1 tablet (10 mg total) by mouth daily. 03/25/22   Omar Person, MD  Melatonin 10 MG CAPS Take 10 mg by mouth at bedtime.    [provider]  montelukast (SINGULAIR) 10 MG tablet Take 1 tablet (10 mg total) by mouth at bedtime. 03/25/22   Omar Person, MD  Multiple Vitamins-Minerals (PRESERVISION AREDS 2) CAPS Take 1 capsule by mouth daily.    [provider]  pantoprazole (PROTONIX) 40 MG tablet Take 1 tablet (40 mg total) by mouth daily. 09/20/21   Omar Person, MD  polyethylene glycol (MIRALAX / GLYCOLAX) 17 g packet Take 17 g by mouth daily. 09/09/20   Erick Blinks, MD  predniSONE (DELTASONE) 5 MG tablet Take 5 mg by mouth every other day.  12/03/21   [provider]      Allergies    Codeine, Penicillins, Doxazosin, Doxycycline, and Hydralazine    Review of Systems   Review of Systems  Constitutional:  Negative for fatigue and fever.  Respiratory:  Negative for chest tightness and shortness of breath.   Cardiovascular:  Negative for chest pain and leg swelling.       Elevated BP  Gastrointestinal:  Negative for abdominal pain, diarrhea and vomiting.  Skin:  Negative for rash.  Neurological:  Negative for headaches.    Physical Exam Updated Vital Signs BP (!) 207/84   Pulse (!) 59   Temp 98.7 F (37.1 C)   Resp (!) 22   Ht 5\' 5"  (1.651  m)   Wt 54 kg   SpO2 90%   BMI 19.80 kg/m  Physical Exam Vitals and nursing note reviewed.  Constitutional:      General: She is not in acute distress.    Appearance: Normal appearance. She is not ill-appearing.  HENT:     Head: Normocephalic.     Mouth/Throat:     Mouth: Mucous membranes are moist.  Cardiovascular:     Rate and Rhythm: Normal rate.  Pulmonary:     Effort: Pulmonary effort is normal. No respiratory distress.  Abdominal:     Palpations: Abdomen is soft.     Tenderness: There is no abdominal tenderness.  Musculoskeletal:        General: No swelling.     Cervical back: No rigidity.  Skin:    General: Skin is warm.  Neurological:     Mental Status: She is alert and oriented to person, place, and time. Mental status is at baseline.     Cranial Nerves: No cranial nerve deficit.  Psychiatric:        Mood and Affect: Mood normal.     ED Results / Procedures / Treatments   Labs (all labs ordered are listed, but only abnormal results are displayed) Labs Reviewed  CBC WITH DIFFERENTIAL/PLATELET  BASIC METABOLIC PANEL    EKG EKG Interpretation Date/Time:  Wednesday October 08 2022 19:42:12 EDT Ventricular Rate:  60 PR Interval:  330 QRS Duration:  90 QT Interval:  408 QTC Calculation: 408 R Axis:   46  Text Interpretation: Atrial-paced rhythm with prolonged AV conduction RSR' or QR pattern in V1 suggests right ventricular conduction delay Left ventricular hypertrophy with repolarization abnormality ( Sokolow-Lyon ) Anteroseptal infarct , age undetermined Abnormal ECG When compared with ECG of 24-Mar-2021 13:00, PREVIOUS ECG IS PRESENT Confirmed by Coralee Pesa [phone]) on 10/08/2022 9:00:48 PM  Radiology No results found.  Procedures Procedures    Medications Ordered in ED Medications  hydrALAZINE (APRESOLINE) injection 5 mg (5 mg Intravenous Given 10/08/22 2133)    ED Course/ Medical Decision Making/ A&P                                  Medical Decision Making Amount and/or Complexity of Data Reviewed Labs: ordered. Radiology: ordered.  Risk OTC drugs. Prescription drug management.   89 year old female presents emergency department concern for HTN.  Baseline systolic blood pressures usually around 1 50-1 60.  She was getting readings over 200 here.  On arrival blood pressure is elevated over 200.  She denies any acute symptoms including headache, vision changes, neurosymptoms, chest pain or shortness of breath.  She is otherwise very well-appearing.  EKG shows no acute  concerning changes.  Chest x-ray is unremarkable.  Blood work is normal for the patient.  Patient was given an IV dose of medication which appropriately reduced her blood pressure.  Patient continues to feel well, blood pressure is downtrending and acceptable.  Patient otherwise feels well and we will plan for outpatient follow-up.  Patient has asked for a dose of her night medications that she will be staying at her daughter's house tonight.  Patient at this time appears safe and stable for discharge and close outpatient follow up. Discharge plan and strict return to ED precautions discussed, patient verbalizes understanding and agreement.        Final Clinical Impression(s) / ED Diagnoses Final diagnoses:  None    Rx / DC Orders ED Discharge Orders     None         Rozelle Logan, DO 10/08/22 2315

## 2022-10-08 NOTE — ED Triage Notes (Signed)
Pov from home, A&o x 4, gcs 15, amb to triage with walker  202/113 BP at home, sts cold over the last couple of days and taking OTC meds at home, no missed BP meds. Denies headache, vision changes or HTN symptoms.

## 2022-10-08 NOTE — Discharge Instructions (Signed)
You have been seen and discharged from the emergency department. You chest xray, heart work up and labs were normal. You were given IV meds and night time home meds.   Follow-up with your primary provider for further evaluation and further care. Take home medications as prescribed. If you have any worsening symptoms or further concerns for your health please return to an emergency department for further evaluation.

## 2022-10-09 DIAGNOSIS — I159 Secondary hypertension, unspecified: Secondary | ICD-10-CM | POA: Diagnosis not present

## 2022-10-13 ENCOUNTER — Ambulatory Visit (HOSPITAL_BASED_OUTPATIENT_CLINIC_OR_DEPARTMENT_OTHER): Payer: Medicare Other | Admitting: Family

## 2022-10-14 NOTE — Progress Notes (Deleted)
Cardiology Office Note Date:  10/14/2022  Patient ID:  Allison Norman, Allison Norman 22-Sep-1924, MRN [id number] PCP:  Daisy Floro, MD  Cardiologist:  Dr. Duke Salvia Electrophysiologist: Dr. Ladona Ridgel    Chief Complaint:  *** annual visit  History of Present Illness: Allison Norman is a 89 y.o. female with history of resistant HTN, CKD (IV), giant cell arteritis, PMR, Afib, chronic CHF (diastolic), tachy-brady w/PPM, PE.  Severe p.HTN , on her echo jan 2022, grade III DD > pt preferred not to pursue further eval/management   She saw Dr. Ladona Ridgel, last 11/26/20, she was sedentary, doing OK though, issues with incontinence/UTIs and had a foley She was on amiodarone in an atypical flutter and noted AF burden about 1/3 of the time, with increased rates, dig was added QOD, perhaps my need to increase her amio. Planned for f/u  Pt has called with concerns of unusually elevated BP of late and concerned about the dig  I saw her 01/29/21 She is accompanied by her caregiver, Zenaida Niece who has been with her for a few years now. She has felt better, when checking her HR is steady at 70. No CP, palpitations She has some SOB, particularly when rolling over/changing positions in bed, settles quickly, no SOB with casual/her usual pace of walking, ADLS Her BP is better, says back in TID clonidine in communication with Dr. Duke Salvia. No bleeding or signs of bleeding Remains w/foley, follows w/urology Her Afib burden had been 99% but recently converted No changes were made, planned for a dig level  Dig was 0.6   She was hospitalized 03/24/21 - 03/28/21 with SOB, she was hypoxic and found with acute PE.  Her eliquis held > heparin. Hematology recommended up-titration of her Eliquis to 5mg  BID She was also treated for pneumonia She discharged on O2  She saw C. Walker NP 06/20/21 with concerns of high BPs at home self increasing her clonidine, her clonidine was adjusted and no other changes were  made. Eliquis dosing deferred to hematology.  I saw her 08/12/21 She is doing well from a cardiac perspective Whishes she was more steady on her feet Wakes with a hoarse voice. No CP, SOB She doesn't think she has had much if any Afib No near syncope or syncope. No bleeding or signs of bleeding No changes made  Has seen cards a couple times since Most recently Dr. Duke Salvia 03/24/22, reported intermittent coughing spells, following with pulm team.  BP looked ok, using some PRN clonidine. Not much exercise in the cooler weather   ER visit 10/08/22 for HTN reporting SBP >200 at home tx with IV hydralazine   *** symptoms *** BP since the ER *** any missed meds?  PRN use? *** AFib burden *** eliquis, bleeding, dose, labs *** amio and dig labs   Device information BSci dual chamber PPM implanted 07/22/2019   Past Medical History:  Diagnosis Date   Aortic stenosis 08/20/2021   Mild-moderate.  Repeat echo in one year.   Atrial fibrillation (HCC) 2017   a. s/p DCCV in 10/2015  b. recurrent in 08/2016 --> rate-control pursued.    Cancer (HCC)    Breast   CKD (chronic kidney disease)    GCA (giant cell arteritis) (HCC)    Hordeolum internum left lower eyelid 06/28/2019   Hypertension    Macular degeneration    Osteoporosis    PMR (polymyalgia rheumatica) (HCC)    Pulmonary hypertension, unspecified (HCC) 02/14/2021   Resistant hypertension 03/15/2019   Shortness  of breath 03/15/2019   Skin cancer     Past Surgical History:  Procedure Laterality Date   CARDIOVERSION N/A 10/18/2015   Procedure: CARDIOVERSION;  Surgeon: Jake Bathe, MD;  Location: [facility] ENDOSCOPY;  Service: Cardiovascular;  Laterality: N/A;   CARDIOVERSION N/A 02/20/2017   Procedure: CARDIOVERSION;  Surgeon: Chrystie Nose, MD;  Location: [facility] ENDOSCOPY;  Service: Cardiovascular;  Laterality: N/A;   CARDIOVERSION N/A 06/18/2017   Procedure: CARDIOVERSION;  Surgeon: Thurmon Fair, MD;  Location: [facility] ENDOSCOPY;  Service:  Cardiovascular;  Laterality: N/A;   CARDIOVERSION N/A 12/21/2018   Procedure: CARDIOVERSION;  Surgeon: Vesta Mixer, MD;  Location: [facility] ENDOSCOPY;  Service: Cardiovascular;  Laterality: N/A;   IR CATHETER TUBE CHANGE  02/28/2021   KNEE SURGERY  2013   MASTECTOMY  1998   left side   PACEMAKER IMPLANT N/A 07/22/2019   Procedure: PACEMAKER IMPLANT;  Surgeon: Marinus Maw, MD;  Location: [facility] INVASIVE CV LAB;  Service: Cardiovascular;  Laterality: N/A;    Current Outpatient Medications  Medication Sig Dispense Refill   acetaminophen (TYLENOL) 500 MG tablet Take 1,000 mg by mouth every 6 (six) hours as needed for moderate pain.     Aflibercept (EYLEA) 2 MG/0.05ML SOLN 2 mg by Intravitreal route every 3 (three) months. Inject in R eye every 11 weeks per Retina specialist for Macular Degenration     amiodarone (PACERONE) 200 MG tablet Take 1/2 (one-half) tablet by mouth once daily 45 tablet 0   apixaban (ELIQUIS) 2.5 MG TABS tablet Take 1 tablet (2.5 mg total) by mouth 2 (two) times daily. 60 tablet 5   budesonide-formoterol (SYMBICORT) 80-4.5 MCG/ACT inhaler Inhale 2 puffs into the lungs in the morning and at bedtime. 1 each 12   carvedilol (COREG) 25 MG tablet TAKE 1 TABLET BY MOUTH TWICE DAILY WITH A MEAL 180 tablet 1   cloNIDine (CATAPRES) 0.2 MG tablet Take 1 tablet (0.2 mg total) by mouth in the morning and at bedtime. 180 tablet 2   cloNIDine (CATAPRES) 0.3 MG tablet TAKE 0.2 MG TABLET BY MOUTH IN THE MORNING, 0.3 MG IN THE AFTERNOON, 0.2 MG AT BEDTIME 90 tablet 3   diazepam (VALIUM) 2 MG tablet Take 2 mg by mouth at bedtime as needed for anxiety.  0   digoxin (LANOXIN) 0.125 MG tablet TAKE 1 TABLET BY MOUTH EVERY OTHER DAY 45 tablet 3   famotidine (PEPCID) 40 MG tablet Take 40 mg by mouth daily.     furosemide (LASIX) 40 MG tablet Take 40 mg by mouth daily.     loratadine (CLARITIN) 10 MG tablet Take 1 tablet (10 mg total) by mouth daily. 30 tablet 11   Melatonin 10 MG CAPS Take 10 mg by  mouth at bedtime.     montelukast (SINGULAIR) 10 MG tablet Take 1 tablet (10 mg total) by mouth at bedtime. 30 tablet 11   Multiple Vitamins-Minerals (PRESERVISION AREDS 2) CAPS Take 1 capsule by mouth daily.     pantoprazole (PROTONIX) 40 MG tablet Take 1 tablet (40 mg total) by mouth daily. 90 tablet 0   polyethylene glycol (MIRALAX / GLYCOLAX) 17 g packet Take 17 g by mouth daily. 14 each 0   predniSONE (DELTASONE) 5 MG tablet Take 5 mg by mouth every other day.     No current facility-administered medications for this visit.    Allergies:   Codeine, Penicillins, Doxazosin, Doxycycline, and Hydralazine   Social History:  The patient  reports that she quit smoking about 59  years ago. Her smoking use included cigarettes. She has never used smokeless tobacco. She reports that she does not drink alcohol and does not use drugs.   Family History:  The patient's family history includes Breast cancer in her daughter; Heart disease in her sister and sister; Kidney failure in her father; Stroke in her mother.  ROS:  Please see the history of present illness.    All other systems are reviewed and otherwise negative.   PHYSICAL EXAM:  VS:  There were no vitals taken for this visit. BMI: There is no height or weight on file to calculate BMI. Well nourished, well developed, in no acute distress, chronically ill appearing HEENT: normocephalic, atraumatic Neck: no JVD, carotid bruits or masses Cardiac:  *** RRR; no significant murmurs, no rubs, or gallops Lungs: *** CTA b/l, no wheezing, rhonchi or rales Abd: soft, nontender MS: no deformity, age appropriate/perhaps advanced atrophy Ext: *** no edema Skin: warm and dry, no rash Neuro:  No gross deficits appreciated Psych: euthymic mood, full affect   *** PPM site is stable, no tethering or discomfort, no thinning or erosion   EKG:  not done today  Device interrogation done today and reviewed by myself:  ***Battery and lead measurements are  stable ***   03/20/22: TTE 1. Left ventricular ejection fraction, by estimation, is 65 to 70%. The  left ventricle has normal function. The left ventricle has no regional  wall motion abnormalities. There is mild concentric left ventricular  hypertrophy. Left ventricular diastolic  parameters are consistent with Grade II diastolic dysfunction  (pseudonormalization). The average left ventricular global longitudinal  strain is -16.8 %. The global longitudinal strain is abnormal.   2. Right ventricular systolic function is normal. The right ventricular  size is normal. There is normal pulmonary artery systolic pressure. The  estimated right ventricular systolic pressure is 35.2 mmHg.   3. Left atrial size was severely dilated.   4. The mitral valve is degenerative. Mild to moderate mitral valve  regurgitation. No evidence of mitral stenosis.   5. The aortic valve is tricuspid. There is moderate calcification of the  aortic valve. There is moderate thickening of the aortic valve. Aortic  valve regurgitation is not visualized. Mild aortic valve stenosis. Aortic  valve mean gradient measures 8.0  mmHg. Aortic valve Vmax measures 1.90 m/s.   6. The inferior vena cava is dilated in size with >50% respiratory  variability, suggesting right atrial pressure of 8 mmHg.   Comparison(s): No significant change from prior study. 03/25/21: Aortic  valve mean gradient measures 13.3 mmHg. Aortic valve peak gradient  measures 22.8 mmHg. Aortic valve area, by VTI measures 1.27 cm.     03/25/21: TTE 1. Left ventricular ejection fraction, by estimation, is 60 to 65%. The  left ventricle has normal function. The left ventricle has no regional  wall motion abnormalities. There is mild left ventricular hypertrophy.  Left ventricular diastolic parameters  were normal.   2. Right ventricular systolic function is normal. The right ventricular  size is normal.   3. Left atrial size was moderately dilated.    4. ? prominent [employer] and pacing wires in RA/RV Cannot r/o right  sided source of embolus . Right atrial size was mildly dilated.   5. The mitral valve is degenerative. Mild mitral valve regurgitation. No  evidence of mitral stenosis. Moderate mitral annular calcification.   6. Sclerotic and calcified non coronary cusp. The aortic valve is normal  in structure. There is  moderate calcification of the aortic valve. There  is moderate thickening of the aortic valve. Aortic valve regurgitation is  not visualized. Mild to moderate  aortic valve stenosis.   7. The inferior vena cava is normal in size with greater than 50%  respiratory variability, suggesting right atrial pressure of 3 mmHg.   01/30/2020: TTE 1. Left ventricular ejection fraction, by estimation, is 65 to 70%. The  left ventricle has normal function. The left ventricle has no regional  wall motion abnormalities. There is severe concentric left ventricular  hypertrophy. Left ventricular diastolic   parameters are consistent with Grade III diastolic dysfunction  (restrictive). Elevated left atrial pressure.   2. Right ventricular systolic function is mildly reduced. The right  ventricular size is mildly enlarged. There is severely elevated pulmonary  artery systolic pressure. The estimated right ventricular systolic  pressure is 65.1 mmHg.   3. Left atrial size was severely dilated.   4. Right atrial size was severely dilated.   5. The mitral valve is normal in structure. Mild to moderate mitral valve  regurgitation. No evidence of mitral stenosis. Moderate mitral annular  calcification.   6. Tricuspid valve regurgitation is moderate to severe.   7. The aortic valve is normal in structure. There is severe calcifcation  of the aortic valve. There is severe thickening of the aortic valve.  Aortic valve regurgitation is not visualized. Mild aortic valve stenosis.  Aortic valve mean gradient measures  12.0 mmHg.   8. The  inferior vena cava is normal in size with greater than 50%  respiratory variability, suggesting right atrial pressure of 3 mmHg.   Recent Labs: 10/08/2022: BUN 29; Creatinine, Ser 1.57; Hemoglobin 12.4; Platelets 211; Potassium 4.2; Sodium 138  No results found for requested labs within last 365 days.   Estimated Creatinine Clearance: 17.9 mL/min (A) (by C-G formula based on SCr of 1.57 mg/dL (H)).   Wt Readings from Last 3 Encounters:  10/08/22 119 lb (54 kg)  05/21/22 125 lb (56.7 kg)  03/25/22 127 lb (57.6 kg)     Other studies reviewed: Additional studies/records reviewed today include: summarized above  ASSESSMENT AND PLAN:  PPM *** Intact function *** No programming changes made  Persistent AFib CHA2DS2Vasc is 5, on eliquis, *** current dosing as per hematology s/p PE ***  % burden *** Chronic low dose amio *** Dig ***  Chronic CHF P.HTN *** No symptoms or exam findings of volume OL Dr. Duke Salvia    Arterial HTN (resistant) ***  Dr. Rollen Sox     Disposition: ***  Current medicines are reviewed at length with the patient today.  The patient did not have any concerns regarding medicines.  Norma Fredrickson, PA-C 10/14/2022 7:42 AM     [facility] [address] [place] [address] [phone] (office)  [phone] (fax)

## 2022-10-15 ENCOUNTER — Ambulatory Visit: Payer: Medicare Other | Admitting: Physician Assistant

## 2022-10-16 ENCOUNTER — Telehealth: Payer: Self-pay | Admitting: Cardiovascular Disease

## 2022-10-16 NOTE — Telephone Encounter (Signed)
Takes Clonidine 0.2 mg am and pm, 0.3 mg at 2:00 pm   12:30 pm blood pressure running 140's/70's  5:30 pm 180/94, 169/91   Had been taking Tessalon Perles when SBP was in 200's, has not taken any since.  She is wanting to know if it is ok to resume Currently has cold/cough that is why she cancelled last weeks appointment  Last week was first time taking blood pressure in while and when she checked SBP above 200 so she went to ED.  She is concerned with blood pressure readings and not being able to reschedule until December  Will forward to Ronn Melena NP for review

## 2022-10-16 NOTE — Telephone Encounter (Signed)
Pt c/o BP issue: STAT if pt c/o blurred vision, one-sided weakness or slurred speech  1. What are your last 5 BP readings? 169/91 - Yesterday evening  2. Are you having any other symptoms (ex. Dizziness, headache, blurred vision, passed out)? No  3. What is your BP issue? Pt states that BP elevates in the evenings. He would like a callback regarding this matter. Please advise

## 2022-10-16 NOTE — Telephone Encounter (Signed)
Can we please put her on a cancellation list, please.  Recommend take Clonidine 0.2mg  am and 0.3mg  in the afternoon and evening. Okay to use Tessalon PRN for cough. Report back BP readings in one week.   Alver Sorrow, NP

## 2022-10-16 NOTE — Telephone Encounter (Signed)
Advised patient, verbalized understanding   Message sent to schedulers to get put on cancellation list

## 2022-10-17 ENCOUNTER — Ambulatory Visit (INDEPENDENT_AMBULATORY_CARE_PROVIDER_SITE_OTHER): Payer: Medicare Other

## 2022-10-17 DIAGNOSIS — I495 Sick sinus syndrome: Secondary | ICD-10-CM

## 2022-10-20 DIAGNOSIS — H353211 Exudative age-related macular degeneration, right eye, with active choroidal neovascularization: Secondary | ICD-10-CM | POA: Diagnosis not present

## 2022-10-20 DIAGNOSIS — H353113 Nonexudative age-related macular degeneration, right eye, advanced atrophic without subfoveal involvement: Secondary | ICD-10-CM | POA: Diagnosis not present

## 2022-10-20 DIAGNOSIS — H353124 Nonexudative age-related macular degeneration, left eye, advanced atrophic with subfoveal involvement: Secondary | ICD-10-CM | POA: Diagnosis not present

## 2022-10-20 DIAGNOSIS — H35351 Cystoid macular degeneration, right eye: Secondary | ICD-10-CM | POA: Diagnosis not present

## 2022-10-21 DIAGNOSIS — H353211 Exudative age-related macular degeneration, right eye, with active choroidal neovascularization: Secondary | ICD-10-CM | POA: Diagnosis not present

## 2022-10-21 DIAGNOSIS — H353113 Nonexudative age-related macular degeneration, right eye, advanced atrophic without subfoveal involvement: Secondary | ICD-10-CM | POA: Diagnosis not present

## 2022-10-21 DIAGNOSIS — H353124 Nonexudative age-related macular degeneration, left eye, advanced atrophic with subfoveal involvement: Secondary | ICD-10-CM | POA: Diagnosis not present

## 2022-10-21 DIAGNOSIS — H35351 Cystoid macular degeneration, right eye: Secondary | ICD-10-CM | POA: Diagnosis not present

## 2022-10-21 LAB — CUP PACEART REMOTE DEVICE CHECK
Battery Remaining Longevity: 72 mo
Battery Remaining Percentage: 100 %
Brady Statistic RA Percent Paced: 98 %
Brady Statistic RV Percent Paced: 17 %
Date Time Interrogation Session: 20241015123900
Implantable Lead Connection Status: 753985
Implantable Lead Connection Status: 753985
Implantable Lead Implant Date: 20210716
Implantable Lead Implant Date: 20210716
Implantable Lead Location: 753859
Implantable Lead Location: 753860
Implantable Lead Model: 7840
Implantable Lead Model: 7841
Implantable Lead Serial Number: 1017229
Implantable Lead Serial Number: 1090224
Implantable Pulse Generator Implant Date: 20210716
Lead Channel Impedance Value: 605 Ohm
Lead Channel Impedance Value: 653 Ohm
Lead Channel Pacing Threshold Amplitude: 0.5 V
Lead Channel Pacing Threshold Amplitude: 1.1 V
Lead Channel Pacing Threshold Pulse Width: 0.4 ms
Lead Channel Pacing Threshold Pulse Width: 0.4 ms
Lead Channel Setting Pacing Amplitude: 2 V
Lead Channel Setting Pacing Amplitude: 2.5 V
Lead Channel Setting Pacing Pulse Width: 0.4 ms
Lead Channel Setting Sensing Sensitivity: 2.5 mV
Pulse Gen Serial Number: 547553
Zone Setting Status: 755011

## 2022-10-22 DIAGNOSIS — I10 Essential (primary) hypertension: Secondary | ICD-10-CM | POA: Diagnosis not present

## 2022-10-22 DIAGNOSIS — R059 Cough, unspecified: Secondary | ICD-10-CM | POA: Diagnosis not present

## 2022-10-22 DIAGNOSIS — Z0000 Encounter for general adult medical examination without abnormal findings: Secondary | ICD-10-CM | POA: Diagnosis not present

## 2022-10-22 DIAGNOSIS — Z23 Encounter for immunization: Secondary | ICD-10-CM | POA: Diagnosis not present

## 2022-10-22 DIAGNOSIS — M353 Polymyalgia rheumatica: Secondary | ICD-10-CM | POA: Diagnosis not present

## 2022-10-22 DIAGNOSIS — M179 Osteoarthritis of knee, unspecified: Secondary | ICD-10-CM | POA: Diagnosis not present

## 2022-10-22 DIAGNOSIS — F411 Generalized anxiety disorder: Secondary | ICD-10-CM | POA: Diagnosis not present

## 2022-10-22 DIAGNOSIS — Z6820 Body mass index (BMI) 20.0-20.9, adult: Secondary | ICD-10-CM | POA: Diagnosis not present

## 2022-10-22 DIAGNOSIS — N184 Chronic kidney disease, stage 4 (severe): Secondary | ICD-10-CM | POA: Diagnosis not present

## 2022-10-27 NOTE — Progress Notes (Signed)
Remote pacemaker transmission.   

## 2022-11-03 ENCOUNTER — Telehealth: Payer: Self-pay | Admitting: Cardiovascular Disease

## 2022-11-03 NOTE — Telephone Encounter (Signed)
Pt currently on carvedilol 25mg  BID and clonidine 0.2mg  in the AM and 0.3mg  in the afternoon and evening.  Previously intolerant to doxazosin, hydralazine, and amlodipine which are the only other BP medication options that would not affect her kidneys. Has stage IV CKD so avoiding ACEi/ARB and MRA.  She is already on max dose carvedilol and a high dose of clonidine (both meds can cause bradycardia and her HR is also already 60). Really don't have any great new options unless she's willing to try something she was previously intolerant to. If BP is only up in the evening, would recommend trying a low dose of hydralazine 10mg  around 2pm since elevated BP readings start after that time and are controlled again by morning.

## 2022-11-03 NOTE — Telephone Encounter (Signed)
Pt would like a callback from nurse to discuss BP reading that she was to monitor and keep record of. Please advise

## 2022-11-03 NOTE — Telephone Encounter (Signed)
Returned call to patient,   Patient states she is doing alright minus her blood pressure. She takes clonidine 0.2mg  am (10am), 2pm 0.3mg , and 0.3mg  (5:30) in the evening. She states her BP is always fine in the morning. She states she takes a pill at 2 and then checks her BP and its always up. Checks BP after that around 5pm 150/81, 153/58, 174/93, 182/96, 194/105 ( all taken different days). She does not check her pressure anymore in the evening. She states this has been going on for a while and wants to know if there is something else she can take. No symptoms when blood pressure is high. She does know there is a lot she cannot take because of kidneys.   Advised patient Dr. Duke Salvia out of office but will send to pharmacy team and call her back with recommendations.

## 2022-11-03 NOTE — Telephone Encounter (Signed)
Returned call to patient,   Reviewed PharmD recommendations with patient. Patient states she was recently given one dose of the hydralazine and was not able to tolerate it and does not wish to try it again.   Will defer to MD at this point with limited options.

## 2022-11-13 NOTE — Progress Notes (Signed)
Cardiology Office Note Date:  11/13/2022  Patient ID:  Allison Norman, Allison Norman [birth year], MRN [id number] PCP:  Daisy Floro, MD  Cardiologist:  Dr. Duke Salvia Electrophysiologist: Dr. Ladona Ridgel    Chief Complaint:   annual visit  History of Present Illness: Allison Norman is a 89 y.o. female with history of resistant HTN, CKD (IV), giant cell arteritis, PMR, Afib, chronic CHF (diastolic), tachy-brady w/PPM, PE.  Severe p.HTN , on her echo jan 2022, grade III DD > pt preferred not to pursue further eval/management   She saw Dr. Ladona Ridgel, last 11/26/20, she was sedentary, doing OK though, issues with incontinence/UTIs and had a foley She was on amiodarone in an atypical flutter and noted AF burden about 1/3 of the time, with increased rates, dig was added QOD, perhaps my need to increase her amio. Planned for f/u  Pt has called with concerns of unusually elevated BP of late and concerned about the dig  I saw her 01/29/21 She is accompanied by her caregiver, Allison Norman who has been with her for a few years now. She has felt better, when checking her HR is steady at 70. No CP, palpitations She has some SOB, particularly when rolling over/changing positions in bed, settles quickly, no SOB with casual/her usual pace of walking, ADLS Her BP is better, says back in TID clonidine in communication with Dr. Duke Salvia. No bleeding or signs of bleeding Remains w/foley, follows w/urology Her Afib burden had been 99% but recently converted No changes were made, planned for a dig level  Dig was 0.6   She was hospitalized 03/24/21 - 03/28/21 with SOB, she was hypoxic and found with acute PE.  Her eliquis held > heparin. Hematology recommended up-titration of her Eliquis to 5mg  BID She was also treated for pneumonia She discharged on O2  She saw C. Walker NP 06/20/21 with concerns of high BPs at home self increasing her clonidine, her clonidine was adjusted and no other changes were made. Eliquis  dosing deferred to hematology.  I saw her 08/12/21 She is doing well from a cardiac perspective Whishes she was more steady on her feet Wakes with a hoarse voice. No CP, SOB She doesn't think she has had much if any Afib No near syncope or syncope. No bleeding or signs of bleeding No changes made  Has seen cards a couple times since Most recently Dr. Duke Salvia 03/24/22, reported intermittent coughing spells, following with pulm team.  BP looked ok, using some PRN clonidine. Not much exercise in the cooler weather   ER visit 10/08/22 for HTN reporting SBP >200 at home tx with IV hydralazine  Pharmacy/HTN clinic working with her on her BP   TODAY She feels well No CP, palpitations Ambulates with a walker but gest around well and feels sturdy with it. No CP, palpitations Denies SOB No near syncope or syncope No bleeding or signs of bleeding  BP much like today at home, sometimes better A recheck by myself 166/80 Has not taken clonidine today, says it starts to get higher towards afternoon and will take her clonidine in the afternoon   Device information BSci dual chamber PPM implanted 07/22/2019   Past Medical History:  Diagnosis Date   Aortic stenosis 08/20/2021   Mild-moderate.  Repeat echo in one year.   Atrial fibrillation (HCC) 2017   a. s/p DCCV in 10/2015  b. recurrent in 08/2016 --> rate-control pursued.    Cancer (HCC)    Breast   CKD (chronic kidney  disease)    GCA (giant cell arteritis) (HCC)    Hordeolum internum left lower eyelid 06/28/2019   Hypertension    Macular degeneration    Osteoporosis    PMR (polymyalgia rheumatica) (HCC)    Pulmonary hypertension, unspecified (HCC) 02/14/2021   Resistant hypertension 03/15/2019   Shortness of breath 03/15/2019   Skin cancer     Past Surgical History:  Procedure Laterality Date   CARDIOVERSION N/A 10/18/2015   Procedure: CARDIOVERSION;  Surgeon: Jake Bathe, MD;  Location: [facility] ENDOSCOPY;  Service: Cardiovascular;   Laterality: N/A;   CARDIOVERSION N/A 02/20/2017   Procedure: CARDIOVERSION;  Surgeon: Chrystie Nose, MD;  Location: [facility] ENDOSCOPY;  Service: Cardiovascular;  Laterality: N/A;   CARDIOVERSION N/A 06/18/2017   Procedure: CARDIOVERSION;  Surgeon: Thurmon Fair, MD;  Location: [facility] ENDOSCOPY;  Service: Cardiovascular;  Laterality: N/A;   CARDIOVERSION N/A 12/21/2018   Procedure: CARDIOVERSION;  Surgeon: Vesta Mixer, MD;  Location: [facility] ENDOSCOPY;  Service: Cardiovascular;  Laterality: N/A;   IR CATHETER TUBE CHANGE  02/28/2021   KNEE SURGERY  2013   MASTECTOMY  1998   left side   PACEMAKER IMPLANT N/A 07/22/2019   Procedure: PACEMAKER IMPLANT;  Surgeon: Marinus Maw, MD;  Location: [facility] INVASIVE CV LAB;  Service: Cardiovascular;  Laterality: N/A;    Current Outpatient Medications  Medication Sig Dispense Refill   acetaminophen (TYLENOL) 500 MG tablet Take 1,000 mg by mouth every 6 (six) hours as needed for moderate pain.     Aflibercept (EYLEA) 2 MG/0.05ML SOLN 2 mg by Intravitreal route every 3 (three) months. Inject in R eye every 11 weeks per Retina specialist for Macular Degenration     amiodarone (PACERONE) 200 MG tablet Take 1/2 (one-half) tablet by mouth once daily 45 tablet 0   apixaban (ELIQUIS) 2.5 MG TABS tablet Take 1 tablet (2.5 mg total) by mouth 2 (two) times daily. 60 tablet 5   budesonide-formoterol (SYMBICORT) 80-4.5 MCG/ACT inhaler Inhale 2 puffs into the lungs in the morning and at bedtime. 1 each 12   carvedilol (COREG) 25 MG tablet TAKE 1 TABLET BY MOUTH TWICE DAILY WITH A MEAL 180 tablet 1   cloNIDine (CATAPRES) 0.2 MG tablet Take 0.2 mg by mouth in the morning.     cloNIDine (CATAPRES) 0.3 MG tablet Take 0.3 mg by mouth 2 (two) times daily. (Midday and evening)     diazepam (VALIUM) 2 MG tablet Take 2 mg by mouth at bedtime as needed for anxiety.  0   digoxin (LANOXIN) 0.125 MG tablet TAKE 1 TABLET BY MOUTH EVERY OTHER DAY 45 tablet 3   famotidine (PEPCID) 40 MG  tablet Take 40 mg by mouth daily.     furosemide (LASIX) 40 MG tablet Take 40 mg by mouth daily.     loratadine (CLARITIN) 10 MG tablet Take 1 tablet (10 mg total) by mouth daily. 30 tablet 11   Melatonin 10 MG CAPS Take 10 mg by mouth at bedtime.     montelukast (SINGULAIR) 10 MG tablet Take 1 tablet (10 mg total) by mouth at bedtime. 30 tablet 11   Multiple Vitamins-Minerals (PRESERVISION AREDS 2) CAPS Take 1 capsule by mouth daily.     pantoprazole (PROTONIX) 40 MG tablet Take 1 tablet (40 mg total) by mouth daily. 90 tablet 0   polyethylene glycol (MIRALAX / GLYCOLAX) 17 g packet Take 17 g by mouth daily. 14 each 0   predniSONE (DELTASONE) 5 MG tablet Take 5 mg by mouth every other  day.     No current facility-administered medications for this visit.    Allergies:   Codeine, Penicillins, Doxazosin, Doxycycline, and Hydralazine   Social History:  The patient  reports that she quit smoking about 56 years ago. Her smoking use included cigarettes. She has never used smokeless tobacco. She reports that she does not drink alcohol and does not use drugs.   Family History:  The patient's family history includes Breast cancer in her daughter; Heart disease in her sister and sister; Kidney failure in her father; Stroke in her mother.  ROS:  Please see the history of present illness.    All other systems are reviewed and otherwise negative.   PHYSICAL EXAM:  VS:  There were no vitals taken for this visit. BMI: There is no height or weight on file to calculate BMI. Well nourished, well developed, in no acute distress, chronically ill appearing HEENT: normocephalic, atraumatic Neck: no JVD, carotid bruits or masses Cardiac:  RRR; no significant murmurs, no rubs, or gallops Lungs: CTA b/l, no wheezing, rhonchi or rales Abd: soft, nontender MS: no deformity, age appropriate/perhaps advanced atrophy Ext: no edema Skin: warm and dry, no rash Neuro:  No gross deficits appreciated Psych: euthymic  mood, full affect   PPM site is stable, no tethering or discomfort, no thinning or erosion   EKG:  not done today  Device interrogation done today and reviewed by myself:  Battery and lead measurements are stable AF <1% No VT Dependent today in both A and V when pacing at 40 AP 97% VP 18% Intermittent AS/VS beats observed   03/20/22: TTE 1. Left ventricular ejection fraction, by estimation, is 65 to 70%. The  left ventricle has normal function. The left ventricle has no regional  wall motion abnormalities. There is mild concentric left ventricular  hypertrophy. Left ventricular diastolic  parameters are consistent with Grade II diastolic dysfunction  (pseudonormalization). The average left ventricular global longitudinal  strain is -16.8 %. The global longitudinal strain is abnormal.   2. Right ventricular systolic function is normal. The right ventricular  size is normal. There is normal pulmonary artery systolic pressure. The  estimated right ventricular systolic pressure is 35.2 mmHg.   3. Left atrial size was severely dilated.   4. The mitral valve is degenerative. Mild to moderate mitral valve  regurgitation. No evidence of mitral stenosis.   5. The aortic valve is tricuspid. There is moderate calcification of the  aortic valve. There is moderate thickening of the aortic valve. Aortic  valve regurgitation is not visualized. Mild aortic valve stenosis. Aortic  valve mean gradient measures 8.0  mmHg. Aortic valve Vmax measures 1.90 m/s.   6. The inferior vena cava is dilated in size with >50% respiratory  variability, suggesting right atrial pressure of 8 mmHg.   Comparison(s): No significant change from prior study. 03/25/21: Aortic  valve mean gradient measures 13.3 mmHg. Aortic valve peak gradient  measures 22.8 mmHg. Aortic valve area, by VTI measures 1.27 cm.     03/25/21: TTE 1. Left ventricular ejection fraction, by estimation, is 60 to 65%. The  left ventricle  has normal function. The left ventricle has no regional  wall motion abnormalities. There is mild left ventricular hypertrophy.  Left ventricular diastolic parameters  were normal.   2. Right ventricular systolic function is normal. The right ventricular  size is normal.   3. Left atrial size was moderately dilated.   4. ? prominent [employer] and pacing wires in  RA/RV Cannot r/o right  sided source of embolus . Right atrial size was mildly dilated.   5. The mitral valve is degenerative. Mild mitral valve regurgitation. No  evidence of mitral stenosis. Moderate mitral annular calcification.   6. Sclerotic and calcified non coronary cusp. The aortic valve is normal  in structure. There is moderate calcification of the aortic valve. There  is moderate thickening of the aortic valve. Aortic valve regurgitation is  not visualized. Mild to moderate  aortic valve stenosis.   7. The inferior vena cava is normal in size with greater than 50%  respiratory variability, suggesting right atrial pressure of 3 mmHg.   01/30/2020: TTE 1. Left ventricular ejection fraction, by estimation, is 65 to 70%. The  left ventricle has normal function. The left ventricle has no regional  wall motion abnormalities. There is severe concentric left ventricular  hypertrophy. Left ventricular diastolic   parameters are consistent with Grade III diastolic dysfunction  (restrictive). Elevated left atrial pressure.   2. Right ventricular systolic function is mildly reduced. The right  ventricular size is mildly enlarged. There is severely elevated pulmonary  artery systolic pressure. The estimated right ventricular systolic  pressure is 65.1 mmHg.   3. Left atrial size was severely dilated.   4. Right atrial size was severely dilated.   5. The mitral valve is normal in structure. Mild to moderate mitral valve  regurgitation. No evidence of mitral stenosis. Moderate mitral annular  calcification.   6. Tricuspid  valve regurgitation is moderate to severe.   7. The aortic valve is normal in structure. There is severe calcifcation  of the aortic valve. There is severe thickening of the aortic valve.  Aortic valve regurgitation is not visualized. Mild aortic valve stenosis.  Aortic valve mean gradient measures  12.0 mmHg.   8. The inferior vena cava is normal in size with greater than 50%  respiratory variability, suggesting right atrial pressure of 3 mmHg.   Recent Labs: 10/08/2022: BUN 29; Creatinine, Ser 1.57; Hemoglobin 12.4; Platelets 211; Potassium 4.2; Sodium 138  No results found for requested labs within last 365 days.   CrCl cannot be calculated (Patient's most recent lab result is older than the maximum 21 days allowed.).   Wt Readings from Last 3 Encounters:  10/08/22 119 lb (54 kg)  05/21/22 125 lb (56.7 kg)  03/25/22 127 lb (57.6 kg)     Other studies reviewed: Additional studies/records reviewed today include: summarized above  ASSESSMENT AND PLAN:  PPM Intact function No programming changes made  Persistent AFib CHA2DS2Vasc is 5, on eliquis,  appropriately dosed <2  % burden Chronic low dose amio, labs are UTD Dig level today   Chronic CHF P.HTN No symptoms or exam findings of volume OL C/w Dr. Rollen Sox    HTN (resistant)  Dr. Rollen Sox Will send my not to her today, pt concerned about her BP  6.  Secondary hypercoagulable state    Disposition: back with EP again in a year, sooner if needed  Current medicines are reviewed at length with the patient today.  The patient did not have any concerns regarding medicines.  Norma Fredrickson, PA-C 11/13/2022 4:33 PM     [facility] [address] [place] [address] [phone] (office)  [phone] (fax)

## 2022-11-14 ENCOUNTER — Ambulatory Visit: Payer: Medicare Other | Attending: Physician Assistant | Admitting: Physician Assistant

## 2022-11-14 ENCOUNTER — Encounter: Payer: Self-pay | Admitting: Physician Assistant

## 2022-11-14 VITALS — BP 178/76 | HR 60 | Ht 65.5 in | Wt 121.8 lb

## 2022-11-14 DIAGNOSIS — Z950 Presence of cardiac pacemaker: Secondary | ICD-10-CM | POA: Diagnosis not present

## 2022-11-14 DIAGNOSIS — D6869 Other thrombophilia: Secondary | ICD-10-CM | POA: Diagnosis not present

## 2022-11-14 DIAGNOSIS — I4819 Other persistent atrial fibrillation: Secondary | ICD-10-CM | POA: Insufficient documentation

## 2022-11-14 DIAGNOSIS — Z79899 Other long term (current) drug therapy: Secondary | ICD-10-CM | POA: Insufficient documentation

## 2022-11-14 DIAGNOSIS — I4891 Unspecified atrial fibrillation: Secondary | ICD-10-CM | POA: Diagnosis not present

## 2022-11-14 LAB — CUP PACEART INCLINIC DEVICE CHECK
Date Time Interrogation Session: 20241108170844
Implantable Lead Connection Status: 753985
Implantable Lead Connection Status: 753985
Implantable Lead Implant Date: 20210716
Implantable Lead Implant Date: 20210716
Implantable Lead Location: 753859
Implantable Lead Location: 753860
Implantable Lead Model: 7840
Implantable Lead Model: 7841
Implantable Lead Serial Number: 1017229
Implantable Lead Serial Number: 1090224
Implantable Pulse Generator Implant Date: 20210716
Lead Channel Pacing Threshold Amplitude: 0.7 V
Lead Channel Pacing Threshold Amplitude: 1 V
Lead Channel Pacing Threshold Pulse Width: 0.4 ms
Lead Channel Pacing Threshold Pulse Width: 0.4 ms
Pulse Gen Serial Number: 547553

## 2022-11-14 NOTE — Patient Instructions (Addendum)
Medication Instructions:  Your physician recommends that you continue on your current medications as directed. Please refer to the Current Medication list given to you today. *If you need a refill on your cardiac medications before your next appointment, please call your pharmacy*   Lab Work: TODAY-DIGOXIN LEVEL  If you have labs (blood work) drawn today and your tests are completely normal, you will receive your results only by: MyChart Message (if you have MyChart) OR A paper copy in the mail If you have any lab test that is abnormal or we need to change your treatment, we will call you to review the results.   Testing/Procedures: NONE ORDERED   Follow-Up: At [facility], you and your health needs are our priority.  As part of our continuing mission to provide you with exceptional heart care, we have created designated Provider Care Teams.  These Care Teams include your primary Cardiologist (physician) and Advanced Practice Providers (APPs -  Physician Assistants and Nurse Practitioners) who all work together to provide you with the care you need, when you need it.  We recommend signing up for the patient portal called "MyChart".  Sign up information is provided on this After Visit Summary.  MyChart is used to connect with patients for Virtual Visits (Telemedicine).  Patients are able to view lab/test results, encounter notes, upcoming appointments, etc.  Non-urgent messages can be sent to your provider as well.   To learn more about what you can do with MyChart, go to [email or web address].    Your next appointment:   12 month(s)  Provider:   Lewayne Bunting, MD or Francis Dowse, PA-C    SCHEDULE FOLLOW UP APPOINTMENT WITH DR [facility] FOR MARCH OR SOONER IF AVAILABLE Other Instructions

## 2022-11-15 LAB — DIGOXIN LEVEL: Digoxin, Serum: 1.3 ng/mL — ABNORMAL HIGH (ref 0.5–0.9)

## 2022-11-17 ENCOUNTER — Other Ambulatory Visit: Payer: Self-pay | Admitting: *Deleted

## 2022-11-17 ENCOUNTER — Telehealth: Payer: Self-pay | Admitting: *Deleted

## 2022-11-17 DIAGNOSIS — R7889 Finding of other specified substances, not normally found in blood: Secondary | ICD-10-CM

## 2022-11-17 DIAGNOSIS — Z79899 Other long term (current) drug therapy: Secondary | ICD-10-CM

## 2022-11-17 NOTE — Telephone Encounter (Signed)
-----   Message from Sheilah Pigeon sent at 11/17/2022  7:16 AM EST ----- Dig level is elevated, stop digoxin and repeat level in a week please

## 2022-11-17 NOTE — Telephone Encounter (Signed)
Patient notified.  She will stop by office for lab work next Monday

## 2022-11-24 DIAGNOSIS — R7889 Finding of other specified substances, not normally found in blood: Secondary | ICD-10-CM | POA: Diagnosis not present

## 2022-11-24 DIAGNOSIS — Z79899 Other long term (current) drug therapy: Secondary | ICD-10-CM | POA: Diagnosis not present

## 2022-11-25 LAB — DIGOXIN LEVEL: Digoxin, Serum: 0.4 ng/mL — ABNORMAL LOW (ref 0.5–0.9)

## 2022-12-22 ENCOUNTER — Encounter (HOSPITAL_BASED_OUTPATIENT_CLINIC_OR_DEPARTMENT_OTHER): Payer: Self-pay | Admitting: Family

## 2022-12-22 ENCOUNTER — Ambulatory Visit (HOSPITAL_BASED_OUTPATIENT_CLINIC_OR_DEPARTMENT_OTHER): Payer: Medicare Other | Admitting: Family

## 2022-12-22 VITALS — BP 180/82 | HR 60 | Ht 65.5 in | Wt 124.0 lb

## 2022-12-22 DIAGNOSIS — I1A0 Resistant hypertension: Secondary | ICD-10-CM | POA: Diagnosis not present

## 2022-12-22 DIAGNOSIS — D6859 Other primary thrombophilia: Secondary | ICD-10-CM

## 2022-12-22 DIAGNOSIS — I495 Sick sinus syndrome: Secondary | ICD-10-CM | POA: Diagnosis not present

## 2022-12-22 DIAGNOSIS — Z79899 Other long term (current) drug therapy: Secondary | ICD-10-CM

## 2022-12-22 DIAGNOSIS — I4819 Other persistent atrial fibrillation: Secondary | ICD-10-CM | POA: Diagnosis not present

## 2022-12-22 DIAGNOSIS — Z950 Presence of cardiac pacemaker: Secondary | ICD-10-CM

## 2022-12-22 DIAGNOSIS — I350 Nonrheumatic aortic (valve) stenosis: Secondary | ICD-10-CM | POA: Diagnosis not present

## 2022-12-22 MED ORDER — CLONIDINE HCL 0.3 MG PO TABS
0.3000 mg | ORAL_TABLET | Freq: Three times a day (TID) | ORAL | 1 refills | Status: DC
Start: 2022-12-22 — End: 2023-01-20

## 2022-12-22 NOTE — Progress Notes (Signed)
Cardiology Office Note:  .   Date:  12/22/2022  ID:  Guido Norman, DOB 08/12/1924, MRN [id number] PCP: Daisy Floro, MD  [facility] Providers Cardiologist:  Chilton Si, MD Electrophysiologist:  Lewayne Bunting, MD    History of Present Illness: [name]   Allison Norman is a 89 y.o. female with history of paroxysmal atrial fibrillation on Amiodarone, hypertension, CKD IV, aortic stenosis, bilateral carotid stenosis (01/2020 1-39% stenosis bilaterally), pulmonary hypertension, pulmonary embolism (06/2021), chronic diastolic heart failure, tachy-brady w/ PPM.   At visit 03/2022 with Dr. Duke Salvia present antihypertensive regimen continued and she was recommended to continue to increase physical activity.   In October called noting elevated BP readings, she trialed one dose of Hydralazine and did not tolerate. Seen by EP 11/14/22 with noted elevated digoxin level 1.3. Digoxin was discontinued with repeat labs with appropriate levels recommended to remain off Digoxin.   Reports BP at home often 170/86. Has been checking sporadically. She is taking Clonidine 0.2mg  at 10am, then 0.3mg  at 4pm, then at bedtime 11:30pm. She is taking her Coreg around 10am and after dinner. She notes some pain in her lower neck which comes and goes, encouraged to discuss with PCP. No symptoms attributed to high blood pressure. Feels her blood pressure goes up in the afternoon but only intermittent readings - she has moved afternoon meds a bit later in the afternoon to 4pm. No chest pain, no exertional dyspnea, no palpitations. She does sit most of the day. After her caregivers will sometimes walk around her home. Her lasix 40mg  is controlling her LE edema. No orthopnea, PND.   Previous antihypertensives Amlodipine- reduced from 10mg  to 5mg  due to edema Metoprolol - switched to Carvedilol Doxazosin - SOB, heaviness in chest Hydralazine - feels poorly Diltaizem - leg swelling  ROS: Please see the history of  present illness.    All other systems reviewed and are negative.   Studies Reviewed: [name]   EKG Interpretation Date/Time:  Monday December 22 2022 15:40:26 EST Ventricular Rate:  60 PR Interval:  300 QRS Duration:  90 QT Interval:  456 QTC Calculation: 456 R Axis:   80  Text Interpretation: Atrial-paced rhythm with prolonged AV conduction No acute changes Confirmed by Gillian Shields ([id number]) on 12/22/2022 3:52:31 PM    Cardiac Studies & Procedures      ECHOCARDIOGRAM  ECHOCARDIOGRAM COMPLETE 03/20/2022  Narrative ECHOCARDIOGRAM REPORT    Patient Name:   Allison Norman Date of Exam: 03/20/2022 Medical Rec #:  [id number]       Height:       65.5 in Accession #:    [id number]      Weight:       128.6 lb Date of Birth:  01-07-1924       BSA:          1.649 m Patient Age:    89 years        BP:           184/84 mmHg Patient Gender: F               HR:           60 bpm. Exam Location:  Outpatient  Procedure: 2D Echo, 3D Echo, Color Doppler, Cardiac Doppler and Strain Analysis  Indications:    Aortic Stenosis  History:        Patient has prior history of Echocardiogram examinations. Pacemaker, Arrythmias:Atrial Fibrillation, Atrial Flutter and Tachybradia syndrome; Risk Factors:Hypertension and Former Smoker.  Pulmonary Embolism.  Sonographer:    Jeryl Columbia RDCS Referring Phys: [id number] TIFFANY Ivor  IMPRESSIONS   1. Left ventricular ejection fraction, by estimation, is 65 to 70%. The left ventricle has normal function. The left ventricle has no regional wall motion abnormalities. There is mild concentric left ventricular hypertrophy. Left ventricular diastolic parameters are consistent with Grade II diastolic dysfunction (pseudonormalization). The average left ventricular global longitudinal strain is -16.8 %. The global longitudinal strain is abnormal. 2. Right ventricular systolic function is normal. The right ventricular size is normal. There is normal pulmonary  artery systolic pressure. The estimated right ventricular systolic pressure is 35.2 mmHg. 3. Left atrial size was severely dilated. 4. The mitral valve is degenerative. Mild to moderate mitral valve regurgitation. No evidence of mitral stenosis. 5. The aortic valve is tricuspid. There is moderate calcification of the aortic valve. There is moderate thickening of the aortic valve. Aortic valve regurgitation is not visualized. Mild aortic valve stenosis. Aortic valve mean gradient measures 8.0 mmHg. Aortic valve Vmax measures 1.90 m/s. 6. The inferior vena cava is dilated in size with >50% respiratory variability, suggesting right atrial pressure of 8 mmHg.  Comparison(s): No significant change from prior study. 03/25/21: Aortic valve mean gradient measures 13.3 mmHg. Aortic valve peak gradient measures 22.8 mmHg. Aortic valve area, by VTI measures 1.27 cm.  FINDINGS Left Ventricle: Left ventricular ejection fraction, by estimation, is 65 to 70%. The left ventricle has normal function. The left ventricle has no regional wall motion abnormalities. The average left ventricular global longitudinal strain is -16.8 %. The global longitudinal strain is abnormal. 3D left ventricular ejection fraction analysis performed but not reported based on interpreter judgement due to suboptimal tracking. The left ventricular internal cavity size was normal in size. There is mild concentric left ventricular hypertrophy. Left ventricular diastolic parameters are consistent with Grade II diastolic dysfunction (pseudonormalization).  Right Ventricle: The right ventricular size is normal. No increase in right ventricular wall thickness. Right ventricular systolic function is normal. There is normal pulmonary artery systolic pressure. The tricuspid regurgitant velocity is 2.61 m/s, and with an assumed right atrial pressure of 8 mmHg, the estimated right ventricular systolic pressure is 35.2 mmHg.  Left Atrium: Left atrial  size was severely dilated.  Right Atrium: Right atrial size was normal in size.  Pericardium: There is no evidence of pericardial effusion.  Mitral Valve: The mitral valve is degenerative in appearance. Mild to moderate mitral annular calcification. Mild to moderate mitral valve regurgitation. No evidence of mitral valve stenosis.  Tricuspid Valve: The tricuspid valve is grossly normal. Tricuspid valve regurgitation is mild . No evidence of tricuspid stenosis.  Aortic Valve: The aortic valve is tricuspid. There is moderate calcification of the aortic valve. There is moderate thickening of the aortic valve. Aortic valve regurgitation is not visualized. Mild aortic stenosis is present. Aortic valve mean gradient measures 8.0 mmHg. Aortic valve peak gradient measures 14.4 mmHg. Aortic valve area, by VTI measures 1.23 cm.  Pulmonic Valve: The pulmonic valve was grossly normal. Pulmonic valve regurgitation is trivial. No evidence of pulmonic stenosis.  Aorta: The aortic root and ascending aorta are structurally normal, with no evidence of dilitation.  Venous: The inferior vena cava is dilated in size with greater than 50% respiratory variability, suggesting right atrial pressure of 8 mmHg.  IAS/Shunts: The atrial septum is grossly normal.  Additional Comments: A device lead is visualized in the right atrium and right ventricle.   LEFT VENTRICLE PLAX 2D LVIDd:  3.75 cm   Diastology LVIDs:         1.82 cm   LV e' medial:    5.33 cm/s LV PW:         1.19 cm   LV E/e' medial:  17.3 LV IVS:        1.33 cm   LV e' lateral:   9.79 cm/s LVOT diam:     1.70 cm   LV E/e' lateral: 9.4 LV SV:         52 LV SV Index:   32        2D Longitudinal Strain LVOT Area:     2.27 cm  2D Strain GLS Avg:     -16.8 %  3D Volume EF: 3D EF:        58 % LV EDV:       87 ml LV ESV:       36 ml LV SV:        51 ml  RIGHT VENTRICLE RV Basal diam:  4.05 cm RV Mid diam:    3.20 cm RV S prime:      10.60 cm/s TAPSE (M-mode): 2.1 cm  LEFT ATRIUM              Index        RIGHT ATRIUM           Index LA diam:        5.30 cm  3.21 cm/m   RA Area:     18.90 cm LA Vol (A2C):   95.5 ml  57.91 ml/m  RA Volume:   48.00 ml  29.11 ml/m LA Vol (A4C):   99.7 ml  60.46 ml/m LA Biplane Vol: 102.0 ml 61.86 ml/m AORTIC VALVE AV Area (Vmax):    1.30 cm AV Area (Vmean):   1.19 cm AV Area (VTI):     1.23 cm AV Vmax:           190.00 cm/s AV Vmean:          130.000 cm/s AV VTI:            0.423 m AV Peak Grad:      14.4 mmHg AV Mean Grad:      8.0 mmHg LVOT Vmax:         109.00 cm/s LVOT Vmean:        68.100 cm/s LVOT VTI:          0.230 m LVOT/AV VTI ratio: 0.54  AORTA Ao Root diam: 2.80 cm Ao Asc diam:  3.10 cm  MITRAL VALVE               TRICUSPID VALVE MV Area (PHT): 3.72 cm    TR Peak grad:   27.2 mmHg MV Decel Time: 204 msec    TR Vmax:        261.00 cm/s MR Peak grad: 163.6 mmHg MR Mean grad: 109.0 mmHg   SHUNTS MR Vmax:      639.50 cm/s  Systemic VTI:  0.23 m MR Vmean:     497.5 cm/s   Systemic Diam: 1.70 cm MV E velocity: 92.20 cm/s MV A velocity: 57.10 cm/s MV E/A ratio:  1.61  Lennie Odor MD Electronically signed by Lennie Odor MD Signature Date/Time: 03/20/2022/4:16:40 PM    Final   MONITORS  LONG TERM MONITOR (3-14 DAYS) 06/28/2019  Narrative 1. Sinus bradycardia and NSR 2. Atrial tachycardia competing with atypical atrial flutter and atrial fib with a RVR/CVR  3. Periods of marked sinus bradycardia with competing junctional pacemaker with rates in the 20's and 30's.  Gregg Taylor,M.D.           Risk Assessment/Calculations:    CHA2DS2-VASc Score = 5   This indicates a 7.2% annual risk of stroke. The patient's score is based upon: CHF History: 1 HTN History: 1 Diabetes History: 0 Stroke History: 0 Vascular Disease History: 0 Age Score: 2 Gender Score: 1    HYPERTENSION CONTROL Vitals:   12/22/22 1544 12/22/22 1605  BP: 138/76 (!)  180/82    The patient's blood pressure is elevated above target today.  In order to address the patient's elevated BP: A current anti-hypertensive medication was adjusted today.; Follow up with general cardiology has been recommended.          Physical Exam:   VS:  BP (!) 180/82   Pulse 60   Ht 5' 5.5" (1.664 m)   Wt 124 lb (56.2 kg)   SpO2 97%   BMI 20.32 kg/m    Wt Readings from Last 3 Encounters:  12/22/22 124 lb (56.2 kg)  11/14/22 121 lb 12.8 oz (55.2 kg)  10/08/22 119 lb (54 kg)    GEN: Well nourished, well developed in no acute distress NECK: No JVD; No carotid bruits CARDIAC: RRR, no murmurs, rubs, gallops RESPIRATORY:  Clear to auscultation without rales, wheezing or rhonchi  ABDOMEN: Soft, non-tender, non-distended EXTREMITIES:  No edema; No deformity   ASSESSMENT AND PLAN: .    Resistant hypertension - Reports home BP 170s/80s. BP elevated in clinic (initial by CMP 138/76 but manual repeat by provider 180/82 which was checked twice). Multiple intolerances and CKD limit titration of medications.  Increase clonidine to 0.3 mg 3 times daily.  Continue Coreg 25 mg twice daily.  Her PPM will prevent bradycardia.  Reports no fatigue with clonidine.  We discussed possible transition to clonidine patch at next clinic visit if 0.3 mg dose is effective.  Persistent atrial fibrillation / High risk medication use / Hypercoagulable state - CHA2DS2-VASc Score = 5 [CHF History: 1, HTN History: 1, Diabetes History: 0, Stroke History: 0, Vascular Disease History: 0, Age Score: 2, Gender Score: 1].  Therefore, the patient's annual risk of stroke is 7.2 %.    Eliquis 2.5mg  BID (reduced dose due to age, renal function). Digoxin previously discontinued due to elevated levels. Continue Amiodarone 100mg  daily, Coreg 25mg  BID.  AS - Echo 03/2022 normal LVEF, mild AS, mild to moderate MR. Continue optimal BP control, as above. Would not be interested in surgical correction.   Hx of PE -  Continue Eliquis.  PPM - Per EP.  Diastolic HF - GDMT limited by CKDIV. Continue Furosemide 40mg  daily.  CKD IV - Careful titration of diuretic and antihypertensive.  Continue to follow with PCP.        Dispo: follow up as scheduled with Dr. Duke Salvia  Signed, Alver Sorrow, NP

## 2022-12-22 NOTE — Patient Instructions (Signed)
Medication Instructions:   CHANGE Clonidine to 0.3mg  three times per day  *If you need a refill on your cardiac medications before your next appointment, please call your pharmacy*  Follow-Up: At [facility], you and your health needs are our priority.  As part of our continuing mission to provide you with exceptional heart care, we have created designated Provider Care Teams.  These Care Teams include your primary Cardiologist (physician) and Advanced Practice Providers (APPs -  Physician Assistants and Nurse Practitioners) who all work together to provide you with the care you need, when you need it.  We recommend signing up for the patient portal called "MyChart".  Sign up information is provided on this After Visit Summary.  MyChart is used to connect with patients for Virtual Visits (Telemedicine).  Patients are able to view lab/test results, encounter notes, upcoming appointments, etc.  Non-urgent messages can be sent to your provider as well.   To learn more about what you can do with MyChart, go to [email or web address].    Your next appointment:   As scheduled with Dr. Duke Salvia  Other Instructions  Tips to Measure your Blood Pressure Correctly  Here's what you can do to ensure a correct reading:  Don't drink a caffeinated beverage or smoke during the 30 minutes before the test.  Sit quietly for five minutes before the test begins.  During the measurement, sit in a chair with your feet on the floor and your arm supported so your elbow is at about heart level.  The inflatable part of the cuff should completely cover at least 80% of your upper arm, and the cuff should be placed on bare skin, not over a shirt.  Don't talk during the measurement.  Blood pressure categories  Blood pressure category SYSTOLIC (upper number)  DIASTOLIC (lower number)  Normal Less than 120 mm Hg and Less than 80 mm Hg  Elevated 120-129 mm Hg and Less than 80 mm Hg  High blood pressure:  Stage 1 hypertension 130-139 mm Hg or 80-89 mm Hg  High blood pressure: Stage 2 hypertension 140 mm Hg or higher or 90 mm Hg or higher  Hypertensive crisis (consult your doctor immediately) Higher than 180 mm Hg and/or Higher than 120 mm Hg  Source: American Heart Association and American Stroke Association. For more on getting your blood pressure under control, buy Controlling Your Blood Pressure, a Special Health Report from [facility].   Blood Pressure Log   Date   Time  Blood Pressure  Example: Nov 1 9 AM 124/78

## 2022-12-23 DIAGNOSIS — H353211 Exudative age-related macular degeneration, right eye, with active choroidal neovascularization: Secondary | ICD-10-CM | POA: Diagnosis not present

## 2022-12-23 DIAGNOSIS — H353113 Nonexudative age-related macular degeneration, right eye, advanced atrophic without subfoveal involvement: Secondary | ICD-10-CM | POA: Diagnosis not present

## 2022-12-23 DIAGNOSIS — H35351 Cystoid macular degeneration, right eye: Secondary | ICD-10-CM | POA: Diagnosis not present

## 2022-12-23 DIAGNOSIS — H353124 Nonexudative age-related macular degeneration, left eye, advanced atrophic with subfoveal involvement: Secondary | ICD-10-CM | POA: Diagnosis not present

## 2023-01-16 ENCOUNTER — Ambulatory Visit (INDEPENDENT_AMBULATORY_CARE_PROVIDER_SITE_OTHER): Payer: Medicare Other

## 2023-01-16 DIAGNOSIS — I4819 Other persistent atrial fibrillation: Secondary | ICD-10-CM

## 2023-01-19 ENCOUNTER — Other Ambulatory Visit (HOSPITAL_BASED_OUTPATIENT_CLINIC_OR_DEPARTMENT_OTHER): Payer: Self-pay | Admitting: Family

## 2023-01-19 DIAGNOSIS — I1A0 Resistant hypertension: Secondary | ICD-10-CM

## 2023-01-20 ENCOUNTER — Telehealth: Payer: Self-pay | Admitting: Cardiovascular Disease

## 2023-01-20 LAB — CUP PACEART REMOTE DEVICE CHECK
Battery Remaining Longevity: 66 mo
Battery Remaining Percentage: 100 %
Brady Statistic RA Percent Paced: 91 %
Brady Statistic RV Percent Paced: 13 %
Date Time Interrogation Session: 20250110022200
Implantable Lead Connection Status: 753985
Implantable Lead Connection Status: 753985
Implantable Lead Implant Date: 20210716
Implantable Lead Implant Date: 20210716
Implantable Lead Location: 753859
Implantable Lead Location: 753860
Implantable Lead Model: 7840
Implantable Lead Model: 7841
Implantable Lead Serial Number: 1017229
Implantable Lead Serial Number: 1090224
Implantable Pulse Generator Implant Date: 20210716
Lead Channel Impedance Value: 559 Ohm
Lead Channel Impedance Value: 608 Ohm
Lead Channel Pacing Threshold Amplitude: 0.6 V
Lead Channel Pacing Threshold Amplitude: 1.2 V
Lead Channel Pacing Threshold Pulse Width: 0.4 ms
Lead Channel Pacing Threshold Pulse Width: 0.4 ms
Lead Channel Setting Pacing Amplitude: 2 V
Lead Channel Setting Pacing Amplitude: 2.5 V
Lead Channel Setting Pacing Pulse Width: 0.4 ms
Lead Channel Setting Sensing Sensitivity: 2.5 mV
Pulse Gen Serial Number: 547553
Zone Setting Status: 755011

## 2023-01-20 MED ORDER — CLONIDINE 0.3 MG/24HR TD PTWK: 0.3000 mg | MEDICATED_PATCH | TRANSDERMAL | 12 refills | Status: DC

## 2023-01-20 NOTE — Telephone Encounter (Signed)
 Difficult to make changes without specific blood pressure readings. Would not suggest further escalating Clonidine  dose. At her last OV we discussed switching Clonidine  0.3mg  weekly patch for better continuity of Clonidine  in her system throughout the day. Would recommend transition from tablet to patch if she is agreeable. Please emphasize that she cannot take the tablet and the patch together, would solely switch to patch and monitor BP for at least a week prior to making other changes.   Okay to reschedule her HTN f/u to 1/27 with Dr. Raford. (Okay to override hold)  Reche GORMAN Finder, NP

## 2023-01-20 NOTE — Telephone Encounter (Signed)
 Spoke with pt, aware of the recommendations. New script sent to the pharmacy and office visit scheduled.

## 2023-01-20 NOTE — Telephone Encounter (Signed)
 Spoke with pt, she has been taking the clonidine  0.3 mg TID and reports around 4 pm her blood pressure will elevate. She does take the clonidine  around 4 pm but she feels it is not helping her and thinks she needs something else. Is currently taking the carvedilol  and furosemide  as directed. She is aware of the problems with medications for her but feels the clonidine  needs to be changed. Aware will forward to catilin and dr raford for their recommendations. Her follow up appointment is February.

## 2023-01-20 NOTE — Telephone Encounter (Signed)
 Pt c/o BP issue: STAT if pt c/o blurred vision, one-sided weakness or slurred speech  1. What are your last 5 BP readings?  168/86 173/87  2. Are you having any other symptoms (ex. Dizziness, headache, blurred vision, passed out)? States she is having not symptoms  3. What is your BP issue? States her BP is high

## 2023-01-22 DIAGNOSIS — H353211 Exudative age-related macular degeneration, right eye, with active choroidal neovascularization: Secondary | ICD-10-CM | POA: Diagnosis not present

## 2023-01-22 DIAGNOSIS — H353113 Nonexudative age-related macular degeneration, right eye, advanced atrophic without subfoveal involvement: Secondary | ICD-10-CM | POA: Diagnosis not present

## 2023-01-22 DIAGNOSIS — H35351 Cystoid macular degeneration, right eye: Secondary | ICD-10-CM | POA: Diagnosis not present

## 2023-01-22 DIAGNOSIS — H353124 Nonexudative age-related macular degeneration, left eye, advanced atrophic with subfoveal involvement: Secondary | ICD-10-CM | POA: Diagnosis not present

## 2023-02-02 ENCOUNTER — Ambulatory Visit (INDEPENDENT_AMBULATORY_CARE_PROVIDER_SITE_OTHER): Payer: Medicare Other | Admitting: Cardiovascular Disease

## 2023-02-02 VITALS — BP 154/84 | HR 58 | Ht 65.5 in | Wt 118.0 lb

## 2023-02-02 DIAGNOSIS — I5032 Chronic diastolic (congestive) heart failure: Secondary | ICD-10-CM | POA: Diagnosis not present

## 2023-02-02 DIAGNOSIS — I2699 Other pulmonary embolism without acute cor pulmonale: Secondary | ICD-10-CM

## 2023-02-02 DIAGNOSIS — I1A0 Resistant hypertension: Secondary | ICD-10-CM

## 2023-02-02 DIAGNOSIS — N1832 Chronic kidney disease, stage 3b: Secondary | ICD-10-CM

## 2023-02-02 DIAGNOSIS — Z5181 Encounter for therapeutic drug level monitoring: Secondary | ICD-10-CM

## 2023-02-02 DIAGNOSIS — I350 Nonrheumatic aortic (valve) stenosis: Secondary | ICD-10-CM | POA: Diagnosis not present

## 2023-02-02 DIAGNOSIS — I4891 Unspecified atrial fibrillation: Secondary | ICD-10-CM

## 2023-02-02 MED ORDER — VALSARTAN 160 MG PO TABS: 160.0000 mg | ORAL_TABLET | Freq: Every day | ORAL | 1 refills | Status: DC

## 2023-02-02 MED ORDER — CLONIDINE HCL 0.1 MG PO TABS: ORAL_TABLET | ORAL | 0 refills | Status: DC

## 2023-02-02 MED ORDER — MINOXIDIL 2.5 MG PO TABS: 2.5000 mg | ORAL_TABLET | Freq: Every day | ORAL | 1 refills | Status: DC

## 2023-02-02 NOTE — Patient Instructions (Addendum)
  Medication Instructions:  STOP CLONIDINE PATCH   START CLONIDINE 0.3 MG TWICE A DAY FOR 2 DAYS, THEN 0.2 MG TWICE A DAY FOR 2 DAYS THEN DAILY FOR 2 DAYS   START MINOXIDIL 2.5 MG TWICE A DAY   START VALSARTAN 160 MG DAILY   Labwork: BMET IN 1 WEEK   Testing/Procedures: NONE  Follow-Up: 1 TO 2 MONTHS WITH CAITLIN W NP OR DR [place] IN [facility]   Any Other Special Instructions Will Be Listed Below (If Applicable). MONITOR YOUR BLOOD PRESSURE AND BRING YOUR READINGS TO YOUR FOLLOW UP APPOINTMENT    If you need a refill on your cardiac medications before your next appointment, please call your pharmacy.

## 2023-02-02 NOTE — Progress Notes (Unsigned)
Cardiology Office Note:   Date:  02/03/2023   ID:  Allison Norman, DOB 12/11/1924, MRN [id number]  PCP:  Daisy Floro, MD  Cardiologist:  Chilton Si, MD    Referring MD: Daisy Floro, MD   CC: Hypertension  History of Present Illness:    Allison Norman is a 89 y.o. female with a hx of paroxysmal atrial fibrillation, hypertension, mild aortic stenosis, and CKD IV here for follow up. She initially established care in the hypertension clinic 02/2019. She reports that she was diagnosed with hypertension several years ago. Prior to developing atrial fibrillation her BP was better controlled. Her medications were changed for improved rate control. She has been working with Corine Shelter, PA-C for her hypertension lately. Amlodipine was increased to 10mg  but she developed LE edema, so amlodipine was reduced back to 5mg  and lasix was started. She only uses lasix sporadically. She noted that she sometimes forgets to take her evening dose of clonidine. At that time hydralazine was increased to 50 mg 3 times daily. She was also started on doxazosin. Metoprolol was switched to carvedilol. Doxazosin was discontinued due to shortness of breath and heaviness in her chest.  Of note, she had also stopped taking her furosemide. Once this restarted her breathing improved. Her blood pressure in the office seemed to be consistently elevated. When she last our pharmacist on 06/2019 her average blood pressure in the morning was 143/75 and 139/75 in the evenings. She was started on minoxidil and the dose was increased to 10 mg. Since that time she had stopped both hydralazine and doxazosin due to intolerances.  She was started on carvedilol 12.5 mg. She noted pulsing in her ears that didn't improve with flonase and Claritin. Her dose of carvedilol was reduced and the pulsing sensation in her ears improved. She was started on diltiazem but did not tolerate it due to lower extremity edema. Her clonidine was  increased to 0.3mg  tid. She had an abnormal pulmonary function test in 2019 but had never seen pulmonology. Carotid dopplers 01/2020 showed 1-39% stenosis bilaterally. She had an echo 01/2020 that revealed LVEF 65-70% with severe LVH and grade 3 diastolic dysfunction. It also showed severe bi-atrial enlargement and moderate to severe tricuspid regurgitation. Aortic stenosis was mild. PASP was . We reccommended increasing her Lasix to daily. She declined further evaluation of her severe pulmonary hypertension and valvular heart disease. She saw Dr. Ladona Ridgel 11/2020 and digoxin was added.   Her blood pressure was controlled in the office, but she noted it was high at home. She was hospitalized 03/2021 with pulmonary embolism. Hematology recommended increasing her Eliquis to 5 mg twice daily. She was also treated for pneumonia.  She saw Gillian Shields, NP 06/2021 and noted that her blood pressures were high in the evenings, so the patient decided to increase her afternoon and evening doses of clonidine. Caitlin recommended that she take 0.2 mg in the morning and evening, and 0.3 mg in the afternoons.  At her last visit, she was struggling with a lingering cough since her PE. At home she noted labile blood pressures, ranging 99-153/56-74. Carvedilol was increased to 25 mg BID. She was maintaining sinus rhythm on amiodarone. At her follow-up with Gillian Shields, NP 09/2021 her blood pressure was overall controlled at home. It was noted that she had seen pulmonology and there was suspicion for LPR/irritable larynx. Protonix 40 mg had been resumed and she had seen improvement in her cough. She had eventually stopped  taking Protonix and was later recommended to take famotidine. She had an echocardiogram 03/2022 revealing LVEF 65-70%, mild LVH, and grade 2 diastolic dysfunction.   At her visit 03/2022 BP was labile ranging 110s-140s.  She called 10/2022 with elevated BP.  She was given hydralazine but idd not tolerate it.   Digoxin was discontinued due to elevated levels.    Her BP was running in the 170s. Clonidine was increased to 0.3mg  with consideration for switching to a patch.  This was changed 01/20/23.  Allison Norman presents with a chief complaint of a skin reaction to a clonidine patch, which was prescribed for hypertension. She reports that the patch has caused itching and redness at the site of application. Allison Norman also mentions that the patch took about a week to show any effect on her blood pressure, which remained high despite its use.  Recently, she reports that her blood pressure readings have been consistently high, particularly in the afternoons.  She and her daughter note that in 2022 she was on minoxidil, which was stopped due to an unspecified issue. She thinks her BP was better controlled at that time and is unsure why it was stopped.   Allison Norman denies any swelling and reports that her kidneys started working on their own after a recent hospitalization. She also reports becoming a great-great-grandmother recently, indicating a level of social support and potential stressors.     Previous antihypertensives: Amlodipine - edema at 10 mg Doxazosin Hydralazine Diltiazem - leg swelling  Past Medical History:  Diagnosis Date   Aortic stenosis 08/20/2021   Mild-moderate.  Repeat echo in one year.   Atrial fibrillation (HCC) 2017   a. s/p DCCV in 10/2015  b. recurrent in 08/2016 --> rate-control pursued.    Cancer (HCC)    Breast   CKD (chronic kidney disease)    GCA (giant cell arteritis) (HCC)    Hordeolum internum left lower eyelid 06/28/2019   Hypertension    Macular degeneration    Osteoporosis    PMR (polymyalgia rheumatica) (HCC)    Pulmonary hypertension, unspecified (HCC) 02/14/2021   Resistant hypertension 03/15/2019   Shortness of breath 03/15/2019   Skin cancer     Past Surgical History:  Procedure Laterality Date   CARDIOVERSION N/A 10/18/2015   Procedure: CARDIOVERSION;   Surgeon: Jake Bathe, MD;  Location: [facility] ENDOSCOPY;  Service: Cardiovascular;  Laterality: N/A;   CARDIOVERSION N/A 02/20/2017   Procedure: CARDIOVERSION;  Surgeon: Chrystie Nose, MD;  Location: [facility] ENDOSCOPY;  Service: Cardiovascular;  Laterality: N/A;   CARDIOVERSION N/A 06/18/2017   Procedure: CARDIOVERSION;  Surgeon: Thurmon Fair, MD;  Location: [facility] ENDOSCOPY;  Service: Cardiovascular;  Laterality: N/A;   CARDIOVERSION N/A 12/21/2018   Procedure: CARDIOVERSION;  Surgeon: Vesta Mixer, MD;  Location: [facility] ENDOSCOPY;  Service: Cardiovascular;  Laterality: N/A;   IR CATHETER TUBE CHANGE  02/28/2021   KNEE SURGERY  2013   MASTECTOMY  1998   left side   PACEMAKER IMPLANT N/A 07/22/2019   Procedure: PACEMAKER IMPLANT;  Surgeon: Marinus Maw, MD;  Location: [facility] INVASIVE CV LAB;  Service: Cardiovascular;  Laterality: N/A;    Current Medications: Current Meds  Medication Sig   acetaminophen (TYLENOL) 500 MG tablet Take 1,000 mg by mouth every 6 (six) hours as needed for moderate pain.   Aflibercept (EYLEA) 2 MG/0.05ML SOLN 2 mg by Intravitreal route every 3 (three) months. Inject in R eye every 11 weeks per Retina specialist for Macular Degenration  amiodarone (PACERONE) 200 MG tablet Take 1/2 (one-half) tablet by mouth once daily   apixaban (ELIQUIS) 2.5 MG TABS tablet Take 1 tablet (2.5 mg total) by mouth 2 (two) times daily.   budesonide-formoterol (SYMBICORT) 80-4.5 MCG/ACT inhaler Inhale 2 puffs into the lungs in the morning and at bedtime.   carvedilol (COREG) 25 MG tablet TAKE 1 TABLET BY MOUTH TWICE DAILY WITH A MEAL   cloNIDine (CATAPRES) 0.1 MG tablet TAKE 3 TABLETS TWICE A DAY FOR 2 DAYS, THEN 2 TABLETS TWICE A DAY FOR 2 DAYS THEN DAILY FOR 2 DAYS   diazepam (VALIUM) 2 MG tablet Take 2 mg by mouth at bedtime as needed for anxiety.   famotidine (PEPCID) 40 MG tablet Take 40 mg by mouth daily.   furosemide (LASIX) 40 MG tablet Take 40 mg by mouth daily.   loratadine (CLARITIN)  10 MG tablet Take 1 tablet (10 mg total) by mouth daily.   Melatonin 10 MG CAPS Take 10 mg by mouth at bedtime.   minoxidil (LONITEN) 2.5 MG tablet Take 1 tablet (2.5 mg total) by mouth daily.   montelukast (SINGULAIR) 10 MG tablet Take 1 tablet (10 mg total) by mouth at bedtime.   Multiple Vitamins-Minerals (PRESERVISION AREDS 2) CAPS Take 1 capsule by mouth daily.   pantoprazole (PROTONIX) 40 MG tablet Take 1 tablet (40 mg total) by mouth daily.   polyethylene glycol (MIRALAX / GLYCOLAX) 17 g packet Take 17 g by mouth daily.   predniSONE (DELTASONE) 5 MG tablet Take 5 mg by mouth every other day.   valsartan (DIOVAN) 160 MG tablet Take 1 tablet (160 mg total) by mouth daily.   [DISCONTINUED] cloNIDine (CATAPRES - DOSED IN MG/24 HR) 0.3 mg/24hr patch Place 1 patch (0.3 mg total) onto the skin once a week.     Allergies:   Codeine, Penicillins, Doxazosin, Doxycycline, and Hydralazine   Social History   Socioeconomic History   Marital status: Married    Spouse name: Not on file   Number of children: 2   Years of education: Not on file   Highest education level: Not on file  Occupational History   Not on file  Tobacco Use   Smoking status: Former    Current packs/day: 0.00    Types: Cigarettes    Quit date: 08/31/1966    Years since quitting: 56.4   Smokeless tobacco: Never  Vaping Use   Vaping status: Never Used  Substance and Sexual Activity   Alcohol use: No   Drug use: No   Sexual activity: Not on file  Other Topics Concern   Not on file  Social History Narrative   epworth sleepiness scale = 8 (08/31/15)   Social Drivers of Corporate investment banker Strain: Not on file  Food Insecurity: Not on file  Transportation Needs: Not on file  Physical Activity: Not on file  Stress: Not on file  Social Connections: Not on file     Family History: The patient's family history includes Breast cancer in her daughter; Heart disease in her sister and sister; Kidney failure in  her father; Stroke in her mother.  ROS:   Please see the history of present illness.    (+) Cough All other systems reviewed and are negative.  EKGs/Labs/Other Studies Reviewed:    Echocardiogram  03/20/2022:  1. Left ventricular ejection fraction, by estimation, is 65 to 70%. The  left ventricle has normal function. The left ventricle has no regional  wall motion abnormalities. There  is mild concentric left ventricular  hypertrophy. Left ventricular diastolic  parameters are consistent with Grade II diastolic dysfunction  (pseudonormalization). The average left ventricular global longitudinal  strain is -16.8 %. The global longitudinal strain is abnormal.   2. Right ventricular systolic function is normal. The right ventricular  size is normal. There is normal pulmonary artery systolic pressure. The  estimated right ventricular systolic pressure is 35.2 mmHg.   3. Left atrial size was severely dilated.   4. The mitral valve is degenerative. Mild to moderate mitral valve  regurgitation. No evidence of mitral stenosis.   5. The aortic valve is tricuspid. There is moderate calcification of the  aortic valve. There is moderate thickening of the aortic valve. Aortic  valve regurgitation is not visualized. Mild aortic valve stenosis. Aortic  valve mean gradient measures 8.0  mmHg. Aortic valve Vmax measures 1.90 m/s.   6. The inferior vena cava is dilated in size with >50% respiratory  variability, suggesting right atrial pressure of 8 mmHg.   Comparison(s): No significant change from prior study. 03/25/21: Aortic  valve mean gradient measures 13.3 mmHg. Aortic valve peak gradient  measures 22.8 mmHg. Aortic valve area, by VTI measures 1.27 cm.   Echo  03/25/2021:  1. Left ventricular ejection fraction, by estimation, is 60 to 65%. The  left ventricle has normal function. The left ventricle has no regional  wall motion abnormalities. There is mild left ventricular hypertrophy.  Left  ventricular diastolic parameters  were normal.   2. Right ventricular systolic function is normal. The right ventricular  size is normal.   3. Left atrial size was moderately dilated.   4. ? prominent [employer] and pacing wires in RA/RV Cannot r/o right  sided source of embolus . Right atrial size was mildly dilated.   5. The mitral valve is degenerative. Mild mitral valve regurgitation. No  evidence of mitral stenosis. Moderate mitral annular calcification.   6. Sclerotic and calcified non coronary cusp. The aortic valve is normal  in structure. There is moderate calcification of the aortic valve. There  is moderate thickening of the aortic valve. Aortic valve regurgitation is  not visualized. Mild to moderate  aortic valve stenosis.   7. The inferior vena cava is normal in size with greater than 50%  respiratory variability, suggesting right atrial pressure of 3 mmHg.   Bilateral LE Venous Doppler 03/25/2021: Summary:  BILATERAL:  -No evidence of popliteal cyst, bilaterally.  RIGHT:  - There is no evidence of deep vein thrombosis in the lower extremity.  However, portions of this examination were limited- see technologist  comments above.     LEFT:  - There is no evidence of deep vein thrombosis in the lower extremity.  However, portions of this examination were limited- see technologist  comments above.   CTA Chest  03/24/2021: IMPRESSION: 1. Positive for nonocclusive thrombus within proximal segmental right lower lobe branch. 2. Patchy ground-glass and consolidative opacities within the dependent right upper lobe and bilateral lower lobes, concerning for pneumonia and/or aspiration. 3. Approximately 8 mm pulmonary nodule in the right upper lobe. Non-contrast chest CT at 6-12 months is recommended. If the nodule is stable at time of repeat CT, then future CT at 18-24 months (from today's scan) is considered optional for low-risk patients, but is recommended for  high-risk patients. This recommendation follows the consensus statement: Guidelines for Management of Incidental Pulmonary Nodules Detected on CT Images: From the Fleischner Society 2017; Radiology 2017; 284:228-243. 4. Approximately  3.3 x 2.2 cm discrete anterior mediastinal mass, potentially a proteinaceous thymic cyst given intermediate density. If clinically warranted, nonemergent, outpatient MRI of the chest with and without contrast. 5. Cardiomegaly. 6. Enlarged main pulmonary artery, which can be seen with pulmonary arterial hypertension. 7. Aortic Atherosclerosis (ICD10-I70.0).  ADDENDUM REPORT: 03/24/2021 16:18 ADDENDUM: Impression # 4 was accidentally truncated.  It should read:   Approximately 3.3 x 2.2 cm discrete anterior mediastinal mass, potentially a proteinaceous thymic cyst given intermediate density. If clinically warranted, nonemergent, outpatient MRI of the chest with and without contrast could further evaluate.  Echo 01/30/20 1. Left ventricular ejection fraction, by estimation, is 65 to 70%. The  left ventricle has normal function. The left ventricle has no regional  wall motion abnormalities. There is severe concentric left ventricular  hypertrophy. Left ventricular diastolic   parameters are consistent with Grade III diastolic dysfunction  (restrictive). Elevated left atrial pressure.   2. Right ventricular systolic function is mildly reduced. The right  ventricular size is mildly enlarged. There is severely elevated pulmonary  artery systolic pressure. The estimated right ventricular systolic  pressure is 65.1 mmHg.   3. Left atrial size was severely dilated.   4. Right atrial size was severely dilated.   5. The mitral valve is normal in structure. Mild to moderate mitral valve  regurgitation. No evidence of mitral stenosis. Moderate mitral annular  calcification.   6. Tricuspid valve regurgitation is moderate to severe.   7. The aortic valve is normal  in structure. There is severe calcifcation  of the aortic valve. There is severe thickening of the aortic valve.  Aortic valve regurgitation is not visualized. Mild aortic valve stenosis.  Aortic valve mean gradient measures  12.0 mmHg.   8. The inferior vena cava is normal in size with greater than 50%  respiratory variability, suggesting right atrial pressure of 3 mmHg.   Carotid Doppler 01/17/20 Right Carotid: Velocities in the right ICA are consistent with a 1-39%  stenosis. The ECA appears >50% stenosed.   Left Carotid: Velocities in the left ICA are consistent with a 1-39%  stenosis.   Vertebrals:  Bilateral vertebral arteries demonstrate antegrade flow.  Subclavians: Right subclavian artery flow was disturbed. Normal flow hemodynamics were seen in the left subclavian artery.  Tortuous right subclavian artery.    EKG:  EKG is personally reviewed. 03/24/2022:  EKG was not ordered. 08/20/2021:  A-paced, V-sensed. Rate 60 bpm. LVH with repolarization.  Recent Labs: 10/08/2022: BUN 29; Creatinine, Ser 1.57; Hemoglobin 12.4; Platelets 211; Potassium 4.2; Sodium 138   Recent Lipid Panel    Component Value Date/Time   CHOL  07/04/2009 0418    81        ATP III CLASSIFICATION:  <200     mg/dL   Desirable  119-147  mg/dL   Borderline High  >=829    mg/dL   High          TRIG 64 07/04/2009 0418   HDL 18 (L) 07/04/2009 0418   CHOLHDL 4.5 07/04/2009 0418   VLDL 13 07/04/2009 0418   LDLCALC  07/04/2009 0418    50        Total Cholesterol/HDL:CHD Risk Coronary Heart Disease Risk Table                     Men   Women  1/2 Average Risk   3.4   3.3  Average Risk       5.0   4.4  2 X Average Risk   9.6   7.1  3 X Average Risk  23.4   11.0        Use the calculated Patient Ratio above and the CHD Risk Table to determine the patient's CHD Risk.        ATP III CLASSIFICATION (LDL):  <100     mg/dL   Optimal  161-096  mg/dL   Near or Above                    Optimal  130-159   mg/dL   Borderline  045-409  mg/dL   High  >811     mg/dL   Very High    Physical Exam:      Wt Readings from Last 3 Encounters:  02/02/23 118 lb (53.5 kg)  12/22/22 124 lb (56.2 kg)  11/14/22 121 lb 12.8 oz (55.2 kg)    VS:  BP (!) 154/84   Pulse (!) 58   Ht 5' 5.5" (1.664 m)   Wt 118 lb (53.5 kg)   SpO2 97%   BMI 19.34 kg/m  , BMI Body mass index is 19.34 kg/m. GENERAL:  Well appearing HEENT: Pupils equal round and reactive, fundi not visualized, oral mucosa unremarkable NECK:  No jugular venous distention, waveform within normal limits, carotid upstroke brisk and symmetric, no carotid bruits LUNGS:  Clear to auscultation bilaterally HEART:  RRR.  PMI not displaced or sustained,S1 and S2 within normal limits, no S3, no S4, no clicks, no rubs, III/VI systolic murmur at the RUSB ABD:  Flat, positive bowel sounds normal in frequency in pitch, no bruits, no rebound, no guarding, no midline pulsatile mass, no hepatomegaly, no splenomegaly EXT:  2 plus pulses throughout, no edema, no cyanosis no clubbing SKIN:  No rashes no nodules NEURO:  Cranial nerves II through XII grossly intact, motor grossly intact throughout PSYCH:  Cognitively intact, oriented to person place and time   ASSESSMENT:    1. Atrial fibrillation with RVR (HCC)   2. Resistant hypertension   3. Atrial fibrillation, unspecified type (HCC)   4. Chronic diastolic CHF (congestive heart failure) (HCC)   5. Other acute pulmonary embolism without acute cor pulmonale (HCC)   6. Aortic valve stenosis, etiology of cardiac valve disease unspecified   7. Stage 3b chronic kidney disease (CKD) (HCC)   8. Therapeutic drug monitoring      PLAN:    # Hypertension Uncontrolled blood pressure despite current regimen of carvedilol and clonidine. Allergic reaction to clonidine patch. Previous regimen under a different pharmacist was reportedly more effective. -Discontinue clonidine patch due to allergic reaction. Taper oral  clonidine over the next 5-6 days to avoid withdrawal. -Initiate minoxidil 2.5mg  twice daily and valsartan 160mg  once daily. -Check blood pressure regularly at home. -Perform BMP in one week to assess kidney function after initiation of valsartan. -Follow-up in 1-2 months to assess blood pressure control with new regimen.   # PAF:  Currently in sinus rhythm.  Continue amiodarone and Eliquis.  Digoxin discontinued due to elevated levels.    # Aortic stenosis: Mean gradient 8 mmHg.  Most consistent with aortic valve sclerosis.  Continue to monitor.   # CKD IV:  Check BMP in one week      Disposition:  FU with Jamoni Broadfoot C. Duke Salvia, MD, [facility] or APP in 1-2 months.   Medication Adjustments/Labs and Tests Ordered: Current medicines are reviewed at length with the patient today.  Concerns regarding medicines are  outlined above.   Orders Placed This Encounter  Procedures   Basic metabolic panel   Meds ordered this encounter  Medications   cloNIDine (CATAPRES) 0.1 MG tablet    Sig: TAKE 3 TABLETS TWICE A DAY FOR 2 DAYS, THEN 2 TABLETS TWICE A DAY FOR 2 DAYS THEN DAILY FOR 2 DAYS    Dispense:  22 tablet    Refill:  0    D/C CLONIDINE PATCH   valsartan (DIOVAN) 160 MG tablet    Sig: Take 1 tablet (160 mg total) by mouth daily.    Dispense:  90 tablet    Refill:  1   minoxidil (LONITEN) 2.5 MG tablet    Sig: Take 1 tablet (2.5 mg total) by mouth daily.    Dispense:  180 tablet    Refill:  1   Patient Instructions   Medication Instructions:  STOP CLONIDINE PATCH   START CLONIDINE 0.3 MG TWICE A DAY FOR 2 DAYS, THEN 0.2 MG TWICE A DAY FOR 2 DAYS THEN DAILY FOR 2 DAYS   START MINOXIDIL 2.5 MG TWICE A DAY   START VALSARTAN 160 MG DAILY   Labwork: BMET IN 1 WEEK   Testing/Procedures: NONE  Follow-Up: 1 TO 2 MONTHS WITH CAITLIN W NP OR DR Byrd Rushlow IN [facility]   Any Other Special Instructions Will Be Listed Below (If Applicable). MONITOR YOUR BLOOD PRESSURE AND BRING  YOUR READINGS TO YOUR FOLLOW UP APPOINTMENT    If you need a refill on your cardiac medications before your next appointment, please call your pharmacy.    Signed, Chilton Si, MD  02/03/2023 4:41 PM    [facility]

## 2023-02-03 ENCOUNTER — Telehealth: Payer: Self-pay | Admitting: Cardiology

## 2023-02-03 ENCOUNTER — Encounter (HOSPITAL_BASED_OUTPATIENT_CLINIC_OR_DEPARTMENT_OTHER): Payer: Self-pay | Admitting: Cardiovascular Disease

## 2023-02-03 NOTE — Telephone Encounter (Signed)
Patient called with low BP readings. Was in the office yesterday and started on valsartan 160mg  and minoxidil 2.5mg  daily with continuation of coreg 25mg  BID. Plans to taper clonidine over the next couple of days. BP was 171/46 this morning but dropped to 99/54 the afternoon. I advised to hold evening dose of coreg and clonidine 0.3mg . In the morning would continue coreg, drop clonidine to 0.2mg  BID x2 days (continuing taper) and minoxidil. Follow up BPs, if stable/elevated then would take the valsartan towards the afternoon. She voiced understanding and wrote things down while on the phone.

## 2023-02-04 ENCOUNTER — Telehealth: Payer: Self-pay | Admitting: Cardiovascular Disease

## 2023-02-04 ENCOUNTER — Encounter: Payer: Self-pay | Admitting: Cardiovascular Disease

## 2023-02-04 NOTE — Telephone Encounter (Signed)
Patient's daughter called after hours service yesterday and spoke with Laverda Page. Called patient to check on her this morning. She states that she is feeling fine this morning. She is eating breakfast currently and has not taken any of her blood pressure medications yet. She will check BP once she is done with breakfast and continue to monitor. She will let us know if it remains low.

## 2023-02-04 NOTE — Telephone Encounter (Signed)
Spoke with pt, her blood pressure this morning was 91/47, she is not going to take the new pill until later in the day because that seems to be when her blood pressure starts going up. Will make dr Duke Salvia aware.

## 2023-02-04 NOTE — Telephone Encounter (Signed)
This encounter was created in error - please disregard.

## 2023-02-04 NOTE — Telephone Encounter (Signed)
Pt Daughter sent this via mychart to the scheduling pool:    I am Allison Norman and my Mother is Allison Norman. Yesterday Dr Duke Salvia put Mom on two new blood pressure medication. Today her pressure was 95/56. she is too weak to stand. She is also still taking clonidine 2 /.3 today and tomorrow Then 2 /.02 for 2 days and then 2/.1 for two days. She is afraid to take any more. Please advise on what to do. You can call her at [phone]. If she doesn't answer call me [phone]. Thank you.

## 2023-02-04 NOTE — Telephone Encounter (Signed)
Patient was calling to talk with Dr. Duke Salvia or nurse

## 2023-02-05 NOTE — Telephone Encounter (Signed)
Spoke with patient regarding blood pressure readings Did not take Minoxidil or Valsartan yesterday  Last night blood pressure 132/72 This am blood pressure 139/69, did take Minoxidil and this afternoon blood pressure 109/69 Did remove Clonidine patch and started the tablets and tapering dose to d/c   Will print for Dr Duke Salvia to review tomorrow when she returns to office

## 2023-02-06 NOTE — Telephone Encounter (Signed)
Pt calling in to speak back with nurse about her bp

## 2023-02-06 NOTE — Telephone Encounter (Signed)
Discussed with Dr Duke Salvia  Will have patient stop Clonidine completely, continue Minoxidil, and start Valsartan 80 mg  Advised patient verbalized understanding

## 2023-02-06 NOTE — Addendum Note (Signed)
Addended by: Regis Bill B on: 02/06/2023 06:01 PM   Modules accepted: Orders

## 2023-02-10 DIAGNOSIS — H02833 Dermatochalasis of right eye, unspecified eyelid: Secondary | ICD-10-CM | POA: Diagnosis not present

## 2023-02-10 DIAGNOSIS — H02836 Dermatochalasis of left eye, unspecified eyelid: Secondary | ICD-10-CM | POA: Diagnosis not present

## 2023-02-10 DIAGNOSIS — H353124 Nonexudative age-related macular degeneration, left eye, advanced atrophic with subfoveal involvement: Secondary | ICD-10-CM | POA: Diagnosis not present

## 2023-02-10 DIAGNOSIS — H353211 Exudative age-related macular degeneration, right eye, with active choroidal neovascularization: Secondary | ICD-10-CM | POA: Diagnosis not present

## 2023-02-10 DIAGNOSIS — H353113 Nonexudative age-related macular degeneration, right eye, advanced atrophic without subfoveal involvement: Secondary | ICD-10-CM | POA: Diagnosis not present

## 2023-02-10 DIAGNOSIS — H35351 Cystoid macular degeneration, right eye: Secondary | ICD-10-CM | POA: Diagnosis not present

## 2023-02-12 DIAGNOSIS — Z5181 Encounter for therapeutic drug level monitoring: Secondary | ICD-10-CM | POA: Diagnosis not present

## 2023-02-12 DIAGNOSIS — I1A0 Resistant hypertension: Secondary | ICD-10-CM | POA: Diagnosis not present

## 2023-02-13 ENCOUNTER — Other Ambulatory Visit (HOSPITAL_COMMUNITY): Payer: Self-pay | Admitting: Internal Medicine

## 2023-02-13 ENCOUNTER — Telehealth (HOSPITAL_BASED_OUTPATIENT_CLINIC_OR_DEPARTMENT_OTHER): Payer: Self-pay | Admitting: Cardiovascular Disease

## 2023-02-13 LAB — BASIC METABOLIC PANEL
BUN/Creatinine Ratio: 16 (ref 12–28)
BUN: 32 mg/dL (ref 10–36)
CO2: 28 mmol/L (ref 20–29)
Calcium: 8.5 mg/dL — ABNORMAL LOW (ref 8.7–10.3)
Chloride: 98 mmol/L (ref 96–106)
Creatinine, Ser: 1.97 mg/dL — ABNORMAL HIGH (ref 0.57–1.00)
Glucose: 130 mg/dL — ABNORMAL HIGH (ref 70–99)
Potassium: 4.6 mmol/L (ref 3.5–5.2)
Sodium: 139 mmol/L (ref 134–144)
eGFR: 23 mL/min/{1.73_m2} — ABNORMAL LOW (ref >59)

## 2023-02-13 NOTE — Telephone Encounter (Signed)
 Spoke to [place] and verified that digoxin  was discontinued.

## 2023-02-13 NOTE — Telephone Encounter (Signed)
 Pt c/o medication issue:  1. Name of Medication:   digoxin  (LANOXIN ) 0.125 MG tablet [[id number]]   2. How are you currently taking this medication (dosage and times per day)?   3. Are you having a reaction (difficulty breathing--STAT)?   4. What is your medication issue?   Caller [place]) wants to confirm patient's digoxin  medication has been discontinued.

## 2023-02-23 ENCOUNTER — Other Ambulatory Visit (HOSPITAL_BASED_OUTPATIENT_CLINIC_OR_DEPARTMENT_OTHER): Payer: Self-pay | Admitting: Cardiovascular Disease

## 2023-02-23 ENCOUNTER — Ambulatory Visit (HOSPITAL_BASED_OUTPATIENT_CLINIC_OR_DEPARTMENT_OTHER): Payer: Medicare Other | Admitting: Cardiovascular Disease

## 2023-02-23 DIAGNOSIS — I480 Paroxysmal atrial fibrillation: Secondary | ICD-10-CM

## 2023-02-23 NOTE — Telephone Encounter (Signed)
Prescription refill request for Eliquis received. Indication:AFIB Last office visit:1/25 Scr:1.97  2/25 Age: 89 Weight:53.5  kg  Prescription refilled

## 2023-02-24 NOTE — Addendum Note (Signed)
Addended by: Elease Etienne A on: 02/24/2023 11:50 AM   Modules accepted: Orders

## 2023-02-24 NOTE — Progress Notes (Signed)
 Remote pacemaker transmission.

## 2023-02-25 ENCOUNTER — Encounter (HOSPITAL_BASED_OUTPATIENT_CLINIC_OR_DEPARTMENT_OTHER): Payer: Self-pay

## 2023-02-26 ENCOUNTER — Other Ambulatory Visit (HOSPITAL_BASED_OUTPATIENT_CLINIC_OR_DEPARTMENT_OTHER): Payer: Self-pay | Admitting: *Deleted

## 2023-02-26 DIAGNOSIS — N289 Disorder of kidney and ureter, unspecified: Secondary | ICD-10-CM

## 2023-02-26 DIAGNOSIS — I10 Essential (primary) hypertension: Secondary | ICD-10-CM

## 2023-03-02 DIAGNOSIS — I10 Essential (primary) hypertension: Secondary | ICD-10-CM | POA: Diagnosis not present

## 2023-03-02 DIAGNOSIS — N289 Disorder of kidney and ureter, unspecified: Secondary | ICD-10-CM | POA: Diagnosis not present

## 2023-03-03 LAB — BASIC METABOLIC PANEL
BUN/Creatinine Ratio: 17 (ref 12–28)
BUN: 34 mg/dL (ref 10–36)
CO2: 24 mmol/L (ref 20–29)
Calcium: 8.3 mg/dL — ABNORMAL LOW (ref 8.7–10.3)
Chloride: 103 mmol/L (ref 96–106)
Creatinine, Ser: 2.02 mg/dL — ABNORMAL HIGH (ref 0.57–1.00)
Glucose: 70 mg/dL (ref 70–99)
Potassium: 4.5 mmol/L (ref 3.5–5.2)
Sodium: 141 mmol/L (ref 134–144)
eGFR: 22 mL/min/{1.73_m2} — ABNORMAL LOW (ref >59)

## 2023-03-04 ENCOUNTER — Ambulatory Visit (HOSPITAL_BASED_OUTPATIENT_CLINIC_OR_DEPARTMENT_OTHER): Payer: Medicare Other | Admitting: Cardiovascular Disease

## 2023-03-04 VITALS — BP 120/60 | HR 62 | Wt 125.0 lb

## 2023-03-04 DIAGNOSIS — I10 Essential (primary) hypertension: Secondary | ICD-10-CM

## 2023-03-04 DIAGNOSIS — I484 Atypical atrial flutter: Secondary | ICD-10-CM

## 2023-03-04 DIAGNOSIS — I4891 Unspecified atrial fibrillation: Secondary | ICD-10-CM

## 2023-03-04 DIAGNOSIS — I350 Nonrheumatic aortic (valve) stenosis: Secondary | ICD-10-CM

## 2023-03-04 DIAGNOSIS — I1A0 Resistant hypertension: Secondary | ICD-10-CM

## 2023-03-04 DIAGNOSIS — Z950 Presence of cardiac pacemaker: Secondary | ICD-10-CM | POA: Diagnosis not present

## 2023-03-04 DIAGNOSIS — I5032 Chronic diastolic (congestive) heart failure: Secondary | ICD-10-CM | POA: Diagnosis not present

## 2023-03-04 MED ORDER — FUROSEMIDE 20 MG PO TABS: 20.0000 mg | ORAL_TABLET | Freq: Every day | ORAL | 3 refills | Status: DC

## 2023-03-04 NOTE — Progress Notes (Unsigned)
 Cardiology Office Note:  .   Date:  03/05/2023  ID:  Allison Norman, DOB 1924/09/18, MRN [id number] PCP: Allison Floro, MD  [facility] Providers Cardiologist:  Allison Si, MD Electrophysiologist:  Allison Bunting, MD    History of Present Illness: Allison Norman Kitchen    Allison Norman is a 89 y.o. female with a hx of paroxysmal atrial fibrillation, hypertension, aortic valve sclerosis, and CKD IV here for follow up. She initially established care in the hypertension clinic 02/2019. She reports that she was diagnosed with hypertension several years ago. Prior to developing atrial fibrillation her BP was better controlled. Her medications were changed for improved rate control. She has been working with Allison Shelter, PA-C for her hypertension lately. Amlodipine was increased to 10mg  but she developed LE edema, so amlodipine was reduced back to 5mg  and lasix was started. She only uses lasix sporadically. She noted that she sometimes forgets to take her evening dose of clonidine. At that time hydralazine was increased to 50 mg 3 times daily. She was also started on doxazosin. Metoprolol was switched to carvedilol. Doxazosin was discontinued due to shortness of breath and heaviness in her chest.  Of note, she had also stopped taking her furosemide. Once this restarted her breathing improved. Her blood pressure in the office seemed to be consistently elevated. When she last our pharmacist on 06/2019 her average blood pressure in the morning was 143/75 and 139/75 in the evenings. She was started on minoxidil and the dose was increased to 10 mg. Since that time she had stopped both hydralazine and doxazosin due to intolerances.   She was started on carvedilol 12.5 mg. She noted pulsing in her ears that didn't improve with flonase and Claritin. Her dose of carvedilol was reduced and the pulsing sensation in her ears improved. She was started on diltiazem but did not tolerate it due to lower extremity edema. Her  clonidine was increased to 0.3mg  tid. She had an abnormal pulmonary function test in 2019 but had never seen pulmonology. Carotid dopplers 01/2020 showed 1-39% stenosis bilaterally. She had an echo 01/2020 that revealed LVEF 65-70% with severe LVH and grade 3 diastolic dysfunction. It also showed severe bi-atrial enlargement and moderate to severe tricuspid regurgitation. Aortic stenosis was mild. PASP was . We reccommended increasing her Lasix to daily. She declined further evaluation of her severe pulmonary hypertension and valvular heart disease. She saw Allison Norman 11/2020 and digoxin was added.    Her blood pressure was controlled in the office, but she noted it was high at home. She was hospitalized 03/2021 with pulmonary embolism. Hematology recommended increasing her Eliquis to 5 mg twice daily. She was also treated for pneumonia.  She saw Allison Shields, NP 06/2021 and noted that her blood pressures were high in the evenings, so the patient decided to increase her afternoon and evening doses of clonidine. Allison Norman recommended that she take 0.2 mg in the morning and evening, and 0.3 mg in the afternoons.   Allison Norman struggled with a lingering cough since her PE. At home she noted labile blood pressures, ranging 99-153/56-74. Carvedilol was increased to 25 mg BID. She was maintaining sinus rhythm on amiodarone. At her follow-up with Allison Shields, NP 09/2021 her blood pressure was overall controlled at home. It was noted that she had seen pulmonology and there was suspicion for LPR/irritable larynx. Protonix 40 mg had been resumed and she had seen improvement in her cough. She had eventually stopped taking Protonix and was  later recommended to take famotidine. She had an echocardiogram 03/2022 revealing LVEF 65-70%, mild LVH, and grade 2 diastolic dysfunction.    At her visit 03/2022 BP was labile ranging 110s-140s.  She called 10/2022 with elevated BP.  She was given hydralazine but idd not tolerate it.   Digoxin was discontinued due to elevated levels.    Her BP was running in the 170s. Clonidine was increased to 0.3mg  and then switched to a patch.  However she had skin irritation.  Her daughter noted that blood pressure previously been better managed on minoxidil so this was restarted and clonidine was discontinued.  Valsartan was added.  She subsequently developed low blood pressures.   Previous antihypertensives: Amlodipine - edema at 10 mg Doxazosin Hydralazine Diltiazem - leg swelling Clonidine patch-sin irritation  Allison Norman manages her blood pressure with carvedilol twice daily and minoxidil around lunchtime. Her blood pressure tends to rise around 5:30 or 6:00 PM, and she uses valsartan as a rescue medication if it exceeds 160/83 mmHg. Recently, she has only needed valsartan four or five times, with her blood pressure stabilizing between 120 to 150 mmHg, averaging in the 130s.  She notes less energy since stopping clonidine, attributing it to aging, with most energy occurring at night. No significant breathing difficulties except when getting situated in bed. No significant leg swelling, which was previously an issue, and she no longer carries a bag for this purpose.  Her current medications include carvedilol twice daily, minoxidil around lunchtime, and valsartan as needed for high blood pressure. She also takes Lasix, reduced to 20 mg from 40 mg to manage kidney function, with an option for an extra 20 mg if swelling occurs.     ROS:  As per HPI  Studies Reviewed: .        Risk Assessment/Calculations:    CHA2DS2-VASc Score = 5   This indicates a 7.2% annual risk of stroke. The patient's score is based upon: CHF History: 1 HTN History: 1 Diabetes History: 0 Stroke History: 0 Vascular Disease History: 0 Age Score: 2 Gender Score: 1            Physical Exam:   VS:  BP 120/60 (BP Location: Left Arm, Patient Position: Sitting, Cuff Size: Normal)   Pulse 62   Wt 125 lb  (56.7 kg)   BMI 20.48 kg/m  , BMI Body mass index is 20.48 kg/m. GENERAL:  Well appearing HEENT: Pupils equal round and reactive, fundi not visualized, oral mucosa unremarkable NECK:  No jugular venous distention, waveform within normal limits, carotid upstroke brisk and symmetric, no bruits, no thyromegaly LUNGS:  Clear to auscultation bilaterally HEART:  RRR.  PMI not displaced or sustained,S1 and S2 within normal limits, no S3, no S4, no clicks, no rubs, II/VI systolic murmur at the LUSB ABD:  Flat, positive bowel sounds normal in frequency in pitch, no bruits, no rebound, no guarding, no midline pulsatile mass, no hepatomegaly, no splenomegaly EXT:  2 plus pulses throughout, no edema, no cyanosis no clubbing SKIN:  No rashes no nodules NEURO:  Cranial nerves II through XII grossly intact, motor grossly intact throughout PSYCH:  Cognitively intact, oriented to person place and time   ASSESSMENT AND PLAN: .    # PAF: Currently in sinus rhythm.  Continue Eliquis, amiodarone, and carvedilol.  # Hypertension Improved control with current regimen of carvedilol and minoxidil. Valsartan used sparingly as rescue medication. Blood pressure readings range from 120 to 150, averaging in the 130s. -Continue  current regimen:Carvedilol twice daily, Minoxidil around lunchtime, and Valsartan as needed if BP >160. -Change Valsartan prescription to as needed instead of daily.  # Chronic Kidney Disease Slight elevation in creatinine, possibly due to antihypertensive medications. -Reduce Lasix dose to 20mg  from 40mg  to lessen impact on kidney function. Patient can take an extra 20mg  as needed for swelling.  Follow-up in 6 months. If any issues arise before then, patient is advised to call the office.        Dispo: f/u 6 months  Signed, Allison Si, MD

## 2023-03-04 NOTE — Patient Instructions (Signed)
 Medication Instructions:  Decrease Lasix to 20 mg once a day you can take an additional 20 mg tablet as needed for swelling. *If you need a refill on your cardiac medications before your next appointment, please call your pharmacy*  Lab Work: No labs  Testing/Procedures: No testing  Follow-Up: At [facility], you and your health needs are our priority.  As part of our continuing mission to provide you with exceptional heart care, we have created designated Provider Care Teams.  These Care Teams include your primary Cardiologist (physician) and Advanced Practice Providers (APPs -  Physician Assistants and Nurse Practitioners) who all work together to provide you with the care you need, when you need it.  We recommend signing up for the patient portal called "MyChart".  Sign up information is provided on this After Visit Summary.  MyChart is used to connect with patients for Virtual Visits (Telemedicine).  Patients are able to view lab/test results, encounter notes, upcoming appointments, etc.  Non-urgent messages can be sent to your provider as well.   To learn more about what you can do with MyChart, go to [email or web address].    Your next appointment:   6 month(s)  Provider:   Chilton Si, MD

## 2023-03-05 ENCOUNTER — Encounter (HOSPITAL_BASED_OUTPATIENT_CLINIC_OR_DEPARTMENT_OTHER): Payer: Self-pay | Admitting: Cardiovascular Disease

## 2023-03-09 ENCOUNTER — Encounter (HOSPITAL_COMMUNITY): Payer: Self-pay

## 2023-03-09 ENCOUNTER — Other Ambulatory Visit: Payer: Self-pay

## 2023-03-09 ENCOUNTER — Observation Stay (HOSPITAL_COMMUNITY)
Admission: EM | Admit: 2023-03-09 | Discharge: 2023-03-11 | Disposition: A | Discharge status: Home / Self Care | Attending: Internal Medicine | Admitting: Internal Medicine

## 2023-03-09 ENCOUNTER — Emergency Department (HOSPITAL_COMMUNITY)

## 2023-03-09 DIAGNOSIS — Z7901 Long term (current) use of anticoagulants: Secondary | ICD-10-CM | POA: Insufficient documentation

## 2023-03-09 DIAGNOSIS — R001 Bradycardia, unspecified: Secondary | ICD-10-CM | POA: Diagnosis present

## 2023-03-09 DIAGNOSIS — R079 Chest pain, unspecified: Secondary | ICD-10-CM | POA: Diagnosis not present

## 2023-03-09 DIAGNOSIS — M1712 Unilateral primary osteoarthritis, left knee: Secondary | ICD-10-CM | POA: Diagnosis not present

## 2023-03-09 DIAGNOSIS — J9811 Atelectasis: Secondary | ICD-10-CM | POA: Diagnosis not present

## 2023-03-09 DIAGNOSIS — S8002XA Contusion of left knee, initial encounter: Secondary | ICD-10-CM | POA: Insufficient documentation

## 2023-03-09 DIAGNOSIS — L0390 Cellulitis, unspecified: Secondary | ICD-10-CM | POA: Diagnosis not present

## 2023-03-09 DIAGNOSIS — I10 Essential (primary) hypertension: Secondary | ICD-10-CM | POA: Diagnosis not present

## 2023-03-09 DIAGNOSIS — R58 Hemorrhage, not elsewhere classified: Secondary | ICD-10-CM | POA: Diagnosis not present

## 2023-03-09 DIAGNOSIS — Z87891 Personal history of nicotine dependence: Secondary | ICD-10-CM | POA: Diagnosis not present

## 2023-03-09 DIAGNOSIS — Z853 Personal history of malignant neoplasm of breast: Secondary | ICD-10-CM | POA: Diagnosis not present

## 2023-03-09 DIAGNOSIS — M25562 Pain in left knee: Secondary | ICD-10-CM | POA: Diagnosis not present

## 2023-03-09 DIAGNOSIS — I891 Lymphangitis: Secondary | ICD-10-CM | POA: Insufficient documentation

## 2023-03-09 DIAGNOSIS — D62 Acute posthemorrhagic anemia: Secondary | ICD-10-CM | POA: Diagnosis not present

## 2023-03-09 DIAGNOSIS — N1832 Chronic kidney disease, stage 3b: Secondary | ICD-10-CM | POA: Insufficient documentation

## 2023-03-09 DIAGNOSIS — I130 Hypertensive heart and chronic kidney disease with heart failure and stage 1 through stage 4 chronic kidney disease, or unspecified chronic kidney disease: Secondary | ICD-10-CM | POA: Insufficient documentation

## 2023-03-09 DIAGNOSIS — M160 Bilateral primary osteoarthritis of hip: Secondary | ICD-10-CM | POA: Diagnosis not present

## 2023-03-09 DIAGNOSIS — I480 Paroxysmal atrial fibrillation: Secondary | ICD-10-CM | POA: Diagnosis present

## 2023-03-09 DIAGNOSIS — S81019A Laceration without foreign body, unspecified knee, initial encounter: Secondary | ICD-10-CM | POA: Diagnosis present

## 2023-03-09 DIAGNOSIS — W19XXXA Unspecified fall, initial encounter: Principal | ICD-10-CM

## 2023-03-09 DIAGNOSIS — M353 Polymyalgia rheumatica: Secondary | ICD-10-CM | POA: Diagnosis not present

## 2023-03-09 DIAGNOSIS — S81812A Laceration without foreign body, left lower leg, initial encounter: Secondary | ICD-10-CM | POA: Insufficient documentation

## 2023-03-09 DIAGNOSIS — R918 Other nonspecific abnormal finding of lung field: Secondary | ICD-10-CM | POA: Diagnosis not present

## 2023-03-09 DIAGNOSIS — I5032 Chronic diastolic (congestive) heart failure: Secondary | ICD-10-CM | POA: Diagnosis present

## 2023-03-09 DIAGNOSIS — D649 Anemia, unspecified: Secondary | ICD-10-CM | POA: Insufficient documentation

## 2023-03-09 DIAGNOSIS — M25561 Pain in right knee: Secondary | ICD-10-CM | POA: Diagnosis not present

## 2023-03-09 DIAGNOSIS — Z86711 Personal history of pulmonary embolism: Secondary | ICD-10-CM | POA: Diagnosis present

## 2023-03-09 DIAGNOSIS — N184 Chronic kidney disease, stage 4 (severe): Secondary | ICD-10-CM | POA: Diagnosis present

## 2023-03-09 DIAGNOSIS — I495 Sick sinus syndrome: Secondary | ICD-10-CM | POA: Diagnosis not present

## 2023-03-09 DIAGNOSIS — Z85828 Personal history of other malignant neoplasm of skin: Secondary | ICD-10-CM | POA: Insufficient documentation

## 2023-03-09 DIAGNOSIS — I482 Chronic atrial fibrillation, unspecified: Secondary | ICD-10-CM | POA: Insufficient documentation

## 2023-03-09 DIAGNOSIS — I4892 Unspecified atrial flutter: Secondary | ICD-10-CM | POA: Insufficient documentation

## 2023-03-09 DIAGNOSIS — J90 Pleural effusion, not elsewhere classified: Secondary | ICD-10-CM | POA: Diagnosis not present

## 2023-03-09 DIAGNOSIS — Z950 Presence of cardiac pacemaker: Secondary | ICD-10-CM | POA: Diagnosis present

## 2023-03-09 DIAGNOSIS — S81012A Laceration without foreign body, left knee, initial encounter: Secondary | ICD-10-CM | POA: Diagnosis not present

## 2023-03-09 DIAGNOSIS — Z79899 Other long term (current) drug therapy: Secondary | ICD-10-CM | POA: Insufficient documentation

## 2023-03-09 DIAGNOSIS — M85861 Other specified disorders of bone density and structure, right lower leg: Secondary | ICD-10-CM | POA: Diagnosis not present

## 2023-03-09 DIAGNOSIS — D509 Iron deficiency anemia, unspecified: Secondary | ICD-10-CM | POA: Diagnosis present

## 2023-03-09 DIAGNOSIS — W06XXXA Fall from bed, initial encounter: Secondary | ICD-10-CM | POA: Diagnosis not present

## 2023-03-09 DIAGNOSIS — M25559 Pain in unspecified hip: Secondary | ICD-10-CM | POA: Diagnosis not present

## 2023-03-09 DIAGNOSIS — Z96651 Presence of right artificial knee joint: Secondary | ICD-10-CM | POA: Diagnosis not present

## 2023-03-09 DIAGNOSIS — I484 Atypical atrial flutter: Secondary | ICD-10-CM | POA: Diagnosis present

## 2023-03-09 LAB — COMPREHENSIVE METABOLIC PANEL
ALT: 22 U/L (ref 0–44)
AST: 24 U/L (ref 15–41)
Albumin: 2.7 g/dL — ABNORMAL LOW (ref 3.5–5.0)
Alkaline Phosphatase: 83 U/L (ref 38–126)
Anion gap: 14 (ref 5–15)
BUN: 38 mg/dL — ABNORMAL HIGH (ref 8–23)
CO2: 22 mmol/L (ref 22–32)
Calcium: 8.2 mg/dL — ABNORMAL LOW (ref 8.9–10.3)
Chloride: 106 mmol/L (ref 98–111)
Creatinine, Ser: 2.02 mg/dL — ABNORMAL HIGH (ref 0.44–1.00)
GFR, Estimated: 22 mL/min — ABNORMAL LOW (ref >60)
Glucose, Bld: 111 mg/dL — ABNORMAL HIGH (ref 70–99)
Potassium: 4.5 mmol/L (ref 3.5–5.1)
Sodium: 142 mmol/L (ref 135–145)
Total Bilirubin: 0.7 mg/dL (ref 0.0–1.2)
Total Protein: 6.1 g/dL — ABNORMAL LOW (ref 6.5–8.1)

## 2023-03-09 LAB — CBC WITH DIFFERENTIAL/PLATELET
Abs Immature Granulocytes: 0.01 10*3/uL (ref 0.00–0.07)
Basophils Absolute: 0 10*3/uL (ref 0.0–0.1)
Basophils Relative: 1 %
Eosinophils Absolute: 0.2 10*3/uL (ref 0.0–0.5)
Eosinophils Relative: 3 %
HCT: 32.9 % — ABNORMAL LOW (ref 36.0–46.0)
Hemoglobin: 10 g/dL — ABNORMAL LOW (ref 12.0–15.0)
Immature Granulocytes: 0 %
Lymphocytes Relative: 15 %
Lymphs Abs: 0.9 10*3/uL (ref 0.7–4.0)
MCH: 29.2 pg (ref 26.0–34.0)
MCHC: 30.4 g/dL (ref 30.0–36.0)
MCV: 96.2 fL (ref 80.0–100.0)
Monocytes Absolute: 0.8 10*3/uL (ref 0.1–1.0)
Monocytes Relative: 12 %
Neutro Abs: 4.5 10*3/uL (ref 1.7–7.7)
Neutrophils Relative %: 69 %
Platelets: 265 10*3/uL (ref 150–400)
RBC: 3.42 MIL/uL — ABNORMAL LOW (ref 3.87–5.11)
RDW: 16.2 % — ABNORMAL HIGH (ref 11.5–15.5)
WBC: 6.4 10*3/uL (ref 4.0–10.5)
nRBC: 0 % (ref 0.0–0.2)

## 2023-03-09 LAB — CBC
HCT: 29.7 % — ABNORMAL LOW (ref 36.0–46.0)
Hemoglobin: 9.2 g/dL — ABNORMAL LOW (ref 12.0–15.0)
MCH: 29.5 pg (ref 26.0–34.0)
MCHC: 31 g/dL (ref 30.0–36.0)
MCV: 95.2 fL (ref 80.0–100.0)
Platelets: 276 10*3/uL (ref 150–400)
RBC: 3.12 MIL/uL — ABNORMAL LOW (ref 3.87–5.11)
RDW: 16.2 % — ABNORMAL HIGH (ref 11.5–15.5)
WBC: 7.7 10*3/uL (ref 4.0–10.5)
nRBC: 0 % (ref 0.0–0.2)

## 2023-03-09 LAB — HEMOGLOBIN AND HEMATOCRIT, BLOOD
HCT: 31.1 % — ABNORMAL LOW (ref 36.0–46.0)
Hemoglobin: 9.6 g/dL — ABNORMAL LOW (ref 12.0–15.0)

## 2023-03-09 MED ORDER — MORPHINE SULFATE (PF) 4 MG/ML IV SOLN
4.0000 mg | Freq: Once | INTRAVENOUS | Status: AC
  Administered 2023-03-09: 4 mg via INTRAVENOUS
  Filled 2023-03-09: qty 1

## 2023-03-09 MED ORDER — POLYETHYLENE GLYCOL 3350 17 G PO PACK: 17.0000 g | PACK | Freq: Every day | ORAL | Status: DC | PRN

## 2023-03-09 MED ORDER — CARVEDILOL 25 MG PO TABS
25.0000 mg | ORAL_TABLET | Freq: Two times a day (BID) | ORAL | Status: DC
  Administered 2023-03-09 – 2023-03-11 (×3): 25 mg via ORAL
  Filled 2023-03-09 (×2): qty 1
  Filled 2023-03-09: qty 2
  Filled 2023-03-09: qty 1

## 2023-03-09 MED ORDER — SODIUM CHLORIDE 0.9% FLUSH
3.0000 mL | Freq: Two times a day (BID) | INTRAVENOUS | Status: DC
  Administered 2023-03-09 – 2023-03-10 (×2): 3 mL via INTRAVENOUS

## 2023-03-09 MED ORDER — ONDANSETRON HCL 4 MG/2ML IJ SOLN
4.0000 mg | Freq: Once | INTRAMUSCULAR | Status: AC
  Administered 2023-03-09: 4 mg via INTRAVENOUS
  Filled 2023-03-09: qty 2

## 2023-03-09 MED ORDER — FAMOTIDINE 20 MG PO TABS
40.0000 mg | ORAL_TABLET | Freq: Every day | ORAL | Status: DC
Start: 2023-03-10 — End: 2023-03-11
  Administered 2023-03-10 – 2023-03-11 (×2): 40 mg via ORAL
  Filled 2023-03-09 (×2): qty 2

## 2023-03-09 MED ORDER — LIDOCAINE-EPINEPHRINE (PF) 2 %-1:200000 IJ SOLN
10.0000 mL | Freq: Once | INTRAMUSCULAR | Status: AC
  Administered 2023-03-09: 10 mL
  Filled 2023-03-09: qty 20

## 2023-03-09 MED ORDER — APIXABAN 2.5 MG PO TABS
2.5000 mg | ORAL_TABLET | Freq: Two times a day (BID) | ORAL | Status: DC
  Administered 2023-03-10: 2.5 mg via ORAL
  Filled 2023-03-09: qty 1

## 2023-03-09 MED ORDER — ACETAMINOPHEN 325 MG PO TABS: 650.0000 mg | ORAL_TABLET | Freq: Four times a day (QID) | ORAL | Status: DC | PRN

## 2023-03-09 MED ORDER — SODIUM CHLORIDE 0.9 % IV SOLN: 1.0000 g | INTRAVENOUS | Status: DC

## 2023-03-09 MED ORDER — FUROSEMIDE 20 MG PO TABS
20.0000 mg | ORAL_TABLET | Freq: Every day | ORAL | Status: DC
Start: 2023-03-10 — End: 2023-03-11
  Administered 2023-03-10 – 2023-03-11 (×2): 20 mg via ORAL
  Filled 2023-03-09 (×2): qty 1

## 2023-03-09 MED ORDER — SODIUM CHLORIDE 0.9 % IV SOLN
1.0000 g | Freq: Once | INTRAVENOUS | Status: AC
  Administered 2023-03-09: 1 g via INTRAVENOUS
  Filled 2023-03-09: qty 10

## 2023-03-09 MED ORDER — SODIUM CHLORIDE 0.9 % IV BOLUS
500.0000 mL | Freq: Once | INTRAVENOUS | Status: AC
  Administered 2023-03-09: 500 mL via INTRAVENOUS

## 2023-03-09 MED ORDER — ACETAMINOPHEN 650 MG RE SUPP: 650.0000 mg | Freq: Four times a day (QID) | RECTAL | Status: DC | PRN

## 2023-03-09 MED ORDER — TRAMADOL HCL 50 MG PO TABS
25.0000 mg | ORAL_TABLET | Freq: Four times a day (QID) | ORAL | Status: DC | PRN
  Administered 2023-03-09 – 2023-03-10 (×2): 25 mg via ORAL
  Filled 2023-03-09 (×2): qty 1

## 2023-03-09 MED ORDER — AMIODARONE HCL 200 MG PO TABS
100.0000 mg | ORAL_TABLET | Freq: Every day | ORAL | Status: DC
  Administered 2023-03-10 – 2023-03-11 (×2): 100 mg via ORAL
  Filled 2023-03-09 (×2): qty 1

## 2023-03-09 MED ORDER — APIXABAN 2.5 MG PO TABS: 2.5000 mg | ORAL_TABLET | Freq: Two times a day (BID) | ORAL | Status: DC

## 2023-03-09 MED ORDER — PANTOPRAZOLE SODIUM 40 MG PO TBEC
40.0000 mg | DELAYED_RELEASE_TABLET | Freq: Every day | ORAL | Status: DC
  Administered 2023-03-10 – 2023-03-11 (×2): 40 mg via ORAL
  Filled 2023-03-09 (×2): qty 1

## 2023-03-09 MED ORDER — MINOXIDIL 2.5 MG PO TABS
2.5000 mg | ORAL_TABLET | Freq: Every day | ORAL | Status: DC
  Administered 2023-03-10 – 2023-03-11 (×2): 2.5 mg via ORAL
  Filled 2023-03-09 (×2): qty 1

## 2023-03-09 MED ORDER — MELATONIN 5 MG PO TABS
10.0000 mg | ORAL_TABLET | Freq: Every day | ORAL | Status: DC
  Administered 2023-03-09 – 2023-03-10 (×2): 10 mg via ORAL
  Filled 2023-03-09 (×2): qty 2

## 2023-03-09 MED ORDER — MORPHINE SULFATE (PF) 2 MG/ML IV SOLN
2.0000 mg | Freq: Once | INTRAVENOUS | Status: AC
  Administered 2023-03-09: 2 mg via INTRAVENOUS
  Filled 2023-03-09: qty 1

## 2023-03-09 NOTE — ED Notes (Signed)
 Pt placed on 3L [place] due to low oxygen saturation following pain medication administration.

## 2023-03-09 NOTE — ED Notes (Signed)
 Tourniquet and dressings removed to assess wounds. Skin tear noted to right knee, bleeding controlled. Skin tear noted to left lower thigh with some bleeding noted after tourniquet removal. Abd pad and coban applied to contolr bleeding. Additional skin tear noted to outer left knee with controlled bleeding.

## 2023-03-09 NOTE — H&P (Signed)
 History and Physical   Allison Norman ZOX:[id number] DOB: 1924/10/28 DOA: 03/09/2023  PCP: Daisy Floro, MD   Patient coming from: Home  Chief Complaint: Fall  HPI: Allison Norman is a 89 y.o. female with medical history significant of CKD 3B, atrial fibrillation, atrial flutter, bradycardia, pacemaker in place, anemia, diastolic CHF, macular degeneration, PMR, PE presenting after a fall at home.  Patient fell when trying to get out of her bed in the morning.  She did not hit her head.  She fell on both her knees.  She normally walks with a walker.  She had significant lacerations on bilateral knees requiring repair in the ED.   Denies fevers, chills, chest pain, shortness breath, abdominal pain, constipation, diarrhea, nausea, vomiting.  ED Course: Vital signs in the ED notable for blood pressure in the 110s to 170 systolic, heart rate in the 50s to 60s, respirate in the teens to 20s, placed on supplemental oxygen.  Lab workup included CMP with BUN 38, creatinine stable at 2.02, calcium 8.2, protein 6.1, albumin 2.7.  CBC with hemoglobin of 10, 9.6 down from baseline of 12 in the setting of significant bleeding from the wounds.  Urinalysis pending.  Blood culture pending.  Chest x-ray showed cardiomegaly with some vascular congestion, small effusions, retrocardiac opacity representing atelectasis versus infiltrate.  Pelvis x-ray showed no acute normality.  Left knee x-ray and right knee x-ray both showed no acute abnormality.  Right knee x-ray showed patient is status post intervention.  Patient received morphine, 500 cc IV fluid, Zofran, ceftriaxone in the ED.  Review of Systems: As per HPI otherwise all other systems reviewed and are negative.  Past Medical History:  Diagnosis Date   Acute blood loss anemia 08/27/2020   Acute hypoxemic respiratory failure (HCC) 03/24/2021   Acute urinary retention 09/07/2020   Aortic stenosis 08/20/2021   Mild-moderate.  Repeat echo in one year.    Atrial fibrillation (HCC) 2017   a. s/p DCCV in 10/2015  b. recurrent in 08/2016 --> rate-control pursued.    Atrial fibrillation with RVR (HCC) 08/31/2015   Cancer (HCC)    Breast   CAP (community acquired pneumonia) 03/24/2021   CKD (chronic kidney disease)    GCA (giant cell arteritis) (HCC)    Hordeolum internum left lower eyelid 06/28/2019   Hypertension    Laceration of right lower extremity 08/28/2020   Macular degeneration    Methicillin resistant Staphylococcus aureus infection 07/02/2009   Annotation: septic right knee  Qualifier: Diagnosis of   By: Ileene Musa      IMO SNOMED Dx Update Oct 2024     Osteoporosis    PMR (polymyalgia rheumatica) (HCC)    Pulmonary hypertension, unspecified (HCC) 02/14/2021   Resistant hypertension 03/15/2019   Shortness of breath 03/15/2019   Skin cancer     Past Surgical History:  Procedure Laterality Date   CARDIOVERSION N/A 10/18/2015   Procedure: CARDIOVERSION;  Surgeon: Jake Bathe, MD;  Location: [facility] ENDOSCOPY;  Service: Cardiovascular;  Laterality: N/A;   CARDIOVERSION N/A 02/20/2017   Procedure: CARDIOVERSION;  Surgeon: Chrystie Nose, MD;  Location: [facility] ENDOSCOPY;  Service: Cardiovascular;  Laterality: N/A;   CARDIOVERSION N/A 06/18/2017   Procedure: CARDIOVERSION;  Surgeon: Thurmon Fair, MD;  Location: [facility] ENDOSCOPY;  Service: Cardiovascular;  Laterality: N/A;   CARDIOVERSION N/A 12/21/2018   Procedure: CARDIOVERSION;  Surgeon: Vesta Mixer, MD;  Location: [facility] ENDOSCOPY;  Service: Cardiovascular;  Laterality: N/A;   IR CATHETER TUBE CHANGE  02/28/2021   KNEE SURGERY  2013   MASTECTOMY  1998   left side   PACEMAKER IMPLANT N/A 07/22/2019   Procedure: PACEMAKER IMPLANT;  Surgeon: Marinus Maw, MD;  Location: [facility] INVASIVE CV LAB;  Service: Cardiovascular;  Laterality: N/A;    Social History  reports that she quit smoking about 56 years ago. Her smoking use included cigarettes. She has never used smokeless tobacco.  She reports that she does not drink alcohol and does not use drugs.  Allergies  Allergen Reactions   Codeine Itching   Penicillins Itching, Rash and Other (See Comments)    Has patient had a PCN reaction causing immediate rash, facial/tongue/throat swelling, SOB or lightheadedness with hypotension: Yes Has patient had a PCN reaction causing severe rash involving mucus membranes or skin necrosis: No Has patient had a PCN reaction that required hospitalization: No Has patient had a PCN reaction occurring within the last 10 years: No If all of the above answers are "NO", then may proceed with Cephalosporin use.   Doxazosin     Heavy feeling in chest, congestion, wheezing   Doxycycline Other (See Comments)    Cause chest tightness   Hydralazine     Felt bad and could hear pulse in head     Family History  Problem Relation Age of Onset   Stroke Mother    Kidney failure Father        died at 72   Heart disease Sister        died at 71   Heart disease Sister        died at 1   Breast cancer Daughter   Reviewed on admission  Prior to Admission medications   Medication Sig Start Date End Date Taking? Authorizing Provider  acetaminophen (TYLENOL) 500 MG tablet Take 1,000 mg by mouth every 6 (six) hours as needed for moderate pain.    [provider]  Aflibercept (EYLEA) 2 MG/0.05ML SOLN 2 mg by Intravitreal route every 3 (three) months. Inject in R eye every 11 weeks per Retina specialist for Macular Degenration    [provider]  amiodarone (PACERONE) 200 MG tablet Take 1/2 (one-half) tablet by mouth once daily 02/13/23   Marinus Maw, MD  apixaban Everlene Balls) 2.5 MG TABS tablet Take 1 tablet by mouth twice daily 02/23/23   Chilton Si, MD  carvedilol (COREG) 25 MG tablet TAKE 1 TABLET BY MOUTH TWICE DAILY WITH A MEAL 09/22/22   Chilton Si, MD  diazepam (VALIUM) 2 MG tablet Take 2 mg by mouth at bedtime as needed for anxiety. 06/23/16   [provider]  famotidine (PEPCID) 40 MG tablet Take 40 mg by mouth daily.    [provider]  furosemide (LASIX) 20 MG tablet Take 1 tablet (20 mg total) by mouth daily. Can take an additional 20 mg tablet once a day as needed 03/04/23 06/02/23  Chilton Si, MD  Melatonin 10 MG CAPS Take 10 mg by mouth at bedtime.    [provider]  minoxidil (LONITEN) 2.5 MG tablet Take 1 tablet (2.5 mg total) by mouth daily. 02/02/23   Chilton Si, MD  montelukast (SINGULAIR) 10 MG tablet Take 1 tablet (10 mg total) by mouth at bedtime. 03/25/22   Omar Person, MD  Multiple Vitamins-Minerals (PRESERVISION AREDS 2) CAPS Take 1 capsule by mouth daily.    [provider]  pantoprazole (PROTONIX) 40 MG tablet Take 1 tablet (40 mg total) by mouth daily. 09/20/21  Omar Person, MD  polyethylene glycol (MIRALAX / GLYCOLAX) 17 g packet Take 17 g by mouth daily. 09/09/20   Erick Blinks, MD  predniSONE (DELTASONE) 5 MG tablet Take 5 mg by mouth every other day. 12/03/21   [provider]  valsartan (DIOVAN) 160 MG tablet Take 160 mg by mouth as directed. 1/2 tablet every evening    [provider]    Physical Exam: Vitals:   03/09/23 1545 03/09/23 1600 03/09/23 1615 03/09/23 1630  BP: 122/60 (!) 125/54 (!) 125/59 132/67  Pulse: (!) 58 (!) 59 62 (!) 57  Resp: 16 14 13 15   Temp:      TempSrc:      SpO2: 100% 100% 100% 98%  Weight:      Height:        Physical Exam Constitutional:      General: She is not in acute distress.    Appearance: Normal appearance.  HENT:     Head: Normocephalic and atraumatic.     Mouth/Throat:     Mouth: Mucous membranes are moist.     Pharynx: Oropharynx is clear.  Eyes:     Extraocular Movements: Extraocular movements intact.     Pupils: Pupils are equal, round, and reactive to light.  Cardiovascular:     Rate and Rhythm: Normal rate and regular rhythm.     Pulses: Normal pulses.     Heart sounds: Normal heart sounds.   Pulmonary:     Effort: Pulmonary effort is normal. No respiratory distress.     Breath sounds: Normal breath sounds.  Abdominal:     General: Bowel sounds are normal. There is no distension.     Palpations: Abdomen is soft.     Tenderness: There is no abdominal tenderness.  Musculoskeletal:        General: No swelling or deformity.     Comments: Bilateral knee bandaged and wrapped.  Evidence of bleeding noted (crusted blood).  Bruising left inner thigh.  Skin:    General: Skin is warm and dry.     Findings: Bruising present.  Neurological:     General: No focal deficit present.     Mental Status: Mental status is at baseline.    Labs on Admission: I have personally reviewed following labs and imaging studies  CBC: Recent Labs  Lab 03/09/23 1030 03/09/23 1410  WBC 6.4  --   NEUTROABS 4.5  --   HGB 10.0* 9.6*  HCT 32.9* 31.1*  MCV 96.2  --   PLT 265  --     Basic Metabolic Panel: Recent Labs  Lab 03/09/23 1030  NA 142  K 4.5  CL 106  CO2 22  GLUCOSE 111*  BUN 38*  CREATININE 2.02*  CALCIUM 8.2*    GFR: Estimated Creatinine Clearance: 14.6 mL/min (A) (by C-G formula based on SCr of 2.02 mg/dL (H)).  Liver Function Tests: Recent Labs  Lab 03/09/23 1030  AST 24  ALT 22  ALKPHOS 83  BILITOT 0.7  PROT 6.1*  ALBUMIN 2.7*    Urine analysis:    Component Value Date/Time   COLORURINE YELLOW 03/25/2021 0551   APPEARANCEUR CLEAR 03/25/2021 0551   LABSPEC 1.015 03/25/2021 0551   PHURINE 6.0 03/25/2021 0551   GLUCOSEU NEGATIVE 03/25/2021 0551   HGBUR SMALL (A) 03/25/2021 0551   BILIRUBINUR NEGATIVE 03/25/2021 0551   KETONESUR NEGATIVE 03/25/2021 0551   PROTEINUR TRACE (A) 03/25/2021 0551   UROBILINOGEN 0.2 08/14/2010 1245   NITRITE NEGATIVE 03/25/2021 0551  LEUKOCYTESUR NEGATIVE 03/25/2021 0551    Radiological Exams on Admission: DG Pelvis 1-2 Views Result Date: 03/09/2023 CLINICAL DATA:  Pain. EXAM: PELVIS - 1-2 VIEW COMPARISON:  CT abdomen/pelvis  dated 03/25/2021. FINDINGS: Diffuse osseous demineralization. Evaluation of the sacrum is limited due to overlying bowel-gas. No acute fracture or diastasis identified. Mild-to-moderate degenerative changes of the bilateral hips. IMPRESSION: 1. No acute osseous abnormality identified. 2. Mild-to-moderate degenerative changes of the bilateral hips. Electronically Signed   By: Hart Robinsons M.D.   On: 03/09/2023 16:27   DG Chest 2 View Result Date: 03/09/2023 CLINICAL DATA:  Pain. EXAM: CHEST - 2 VIEW COMPARISON:  Chest radiograph dated 10/08/2022. FINDINGS: Stable cardiomegaly. Stable left chest wall pacemaker in place. Bilateral central perihilar interstitial prominence. Blunting of the left-greater-than-right costophrenic angles may reflect small bilateral pleural effusions. Mild bibasilar atelectasis. Retrocardiac opacities. No pneumothorax. Diffuse osseous demineralization. No acute osseous abnormality identified. IMPRESSION: 1. Cardiomegaly with findings suggestive of central pulmonary vascular congestion. 2. Small bilateral pleural effusions with bibasilar atelectasis. 3. Retrocardiac opacities could reflect atelectasis or infiltrate. Electronically Signed   By: Hart Robinsons M.D.   On: 03/09/2023 16:25   DG Knee Complete 4 Views Left Result Date: 03/09/2023 CLINICAL DATA:  Pain. EXAM: LEFT KNEE - COMPLETE 4+ VIEW COMPARISON:  None Available. FINDINGS: Diffuse osseous demineralization. No acute fracture, dislocation, or significant joint effusion. Mild-to-moderate degenerative changes of the knee with joint space narrowing and marginal osteophytosis most pronounced in the medial femorotibial compartment. Prepatellar soft tissue swelling. IMPRESSION: 1. No acute osseous abnormality. 2. Mild-to-moderate degenerative changes of the knee. Electronically Signed   By: Hart Robinsons M.D.   On: 03/09/2023 16:17   DG Knee Complete 4 Views Right Result Date: 03/09/2023 CLINICAL DATA:  Knee pain EXAM: RIGHT  KNEE - COMPLETE 4 VIEW COMPARISON:  None Available. FINDINGS: Postsurgical changes of right knee arthroplasty. Hardware appears intact and well seated. No evidence of acute displaced fracture, dislocation, or joint effusion. Diffuse osteopenia. Soft tissues are unremarkable. IMPRESSION: Postsurgical changes of right knee arthroplasty without evidence of hardware complication. Electronically Signed   By: Agustin Cree M.D.   On: 03/09/2023 16:13   EKG: Independently reviewed.  Paced rhythm at 69 bpm.  Nonspecific T wave changes.  QTc 503.  Assessment/Plan Principal Problem:   Deep laceration of knee Active Problems:   History of pulmonary embolus (PE)   Essential hypertension   PAF (paroxysmal atrial fibrillation) (HCC)   Stage 3b chronic kidney disease (CKD) (HCC)   Iron deficiency anemia   Pacemaker   Polymyalgia rheumatica (HCC)   Atypical atrial flutter (HCC)   Bradycardia   Chronic diastolic CHF (congestive heart failure) (HCC)   Anemia   Cellulitis   Deep laceration of knee Fall > Patient presenting after a fall at home.  Landed on bilateral knees.  Had significant lacerations to both knees.  Had some spurting blood initially per EDP.  Hemoglobin is dropped baseline of 12-10, 9.6 on repeat. > EDP requesting monitoring overnight concerning significant drop in hemoglobin and also possible cellulitis as below. - Trend CBC overnight - Transfuse as indicated  ?Cellulitis ?Lymphangitis > Skin changes in her left inner thigh.  Look to be more likely bruising to [place], will monitor. > No leukocytosis and no fever.  Patient started on ceftriaxone in the ED. - Hold on further antibiotics for now. - Trend fever curve and WBC  CKD 3B > Creatinine stable 2.2 in the ED. - Trend renal function and electrolytes  Atrial  fibrillation Atrial flutter Bradycardia Pacemaker > Currently in paced rhythm in the ED. - Continue amiodarone, carvedilol - Resume Eliquis tomorrow morning, hold for  now given significant bleeding  Anemia > Acute on chronic currently.  As above as low as 9.6 from previous baseline of 12 after significant bleeding from the laceration. - Trending CBC as above  Chronic diastolic CHF > Last echo showed EF 65-70%, G2 DD, normal RV function. - Continue home carvedilol, Lasix  PMR - Continue prednisone  History of PE - Holding Eliquis tonight and resuming in the morning as above  DVT prophylaxis: Eliquis Code Status:   Full Family Communication:  None on admission  Disposition Plan:   Patient is from:  Home  Anticipated DC to:  Home  Anticipated DC date:  1 to 2 days  Anticipated DC barriers: None  Consults called:  None Admission status:  Observation, MedSurg  Severity of Illness: The appropriate patient status for this patient is OBSERVATION. Observation status is judged to be reasonable and necessary in order to provide the required intensity of service to ensure the patient's safety. The patient's presenting symptoms, physical exam findings, and initial radiographic and laboratory data in the context of their medical condition is felt to place them at decreased risk for further clinical deterioration. Furthermore, it is anticipated that the patient will be medically stable for discharge from the hospital within 2 midnights of admission.    Synetta Fail MD [facility]  How to contact the [facility] Attending or Consulting provider 7A - 7P or covering provider during after hours 7P -7A, for this patient?   Check the care team in [facility] and look for a) attending/consulting [facility] provider listed and b) the [facility] team listed Log into [email or web address] and use [place]'s universal password to access. If you do not have the password, please contact the hospital operator. Locate the [facility] provider you are looking for under [facility] and page to a number that you can be directly reached. If you still have difficulty reaching the provider, please  page the [facility] (Director on Call) for the Hospitalists listed on amion for assistance.  03/09/2023, 5:18 PM

## 2023-03-09 NOTE — ED Triage Notes (Signed)
 Pt presents to ED via EMS from home with complaint of fall this AM out of bed. Pt on blood thinners, did not hit her head. Lacerations noted to bilateral knee area. Covered with dressings. Tourniquet placed on left thigh by EMS to control bleeding at 0924. Alert and oriented x4. Only complaining of pain in the legs. Bleeding controlled at this time.

## 2023-03-09 NOTE — ED Provider Notes (Signed)
.  Laceration Repair  Date/Time: 03/09/2023 1:12 PM  Performed by: Silva Bandy, PA-C Authorized by: Silva Bandy, PA-C   Consent:    Consent obtained:  Verbal   Consent given by:  Patient   Risks, benefits, and alternatives were discussed: yes     Risks discussed:  Infection, pain, retained foreign body, tendon damage, vascular damage, poor wound healing, poor cosmetic result, need for additional repair and nerve damage   Alternatives discussed:  No treatment, delayed treatment, observation and referral Universal protocol:    Procedure explained and questions answered to patient or proxy's satisfaction: yes     Patient identity confirmed:  Verbally with patient Anesthesia:    Anesthesia method:  Local infiltration   Local anesthetic:  Lidocaine 2% WITH epi Laceration details:    Location:  Leg   Leg location:  L knee   Length (cm):  10 Exploration:    Hemostasis achieved with:  Tied off vessels and epinephrine   Wound extent: vascular damage     Contaminated: yes   Treatment:    Area cleansed with:  Saline   Amount of cleaning:  Extensive   Irrigation solution:  Sterile saline   Irrigation volume:  1 L   Irrigation method:  Pressure wash   Layers/structures repaired:  Deep subcutaneous Deep subcutaneous:    Suture size:  3-0   Suture material:  Vicryl   Suture technique:  Simple interrupted   Number of sutures:  8 Skin repair:    Repair method:  Sutures   Suture size:  4-0   Suture material:  Prolene   Suture technique:  Simple interrupted   Number of sutures:  20 Approximation:    Approximation:  Loose Repair type:    Repair type:  Complex Post-procedure details:    Dressing:  Antibiotic ointment and non-adherent dressing   Procedure completion:  Tolerated well, no immediate complications     Silva Bandy, PA-C 03/09/23 1315    Jacalyn Lefevre, MD 03/09/23 1738

## 2023-03-09 NOTE — ED Notes (Signed)
 Mild bleeding noted to sutured wound on inner lower thigh area. PA and MD notified and at bedside to assess. Wound cleaned off. Combat gauze applied per verbal order from [facility] MD. Covered with gauze roll and tape.

## 2023-03-09 NOTE — ED Provider Notes (Signed)
 [place] EMERGENCY DEPARTMENT AT [facility] Provider Note   CSN: [id number] Arrival date & time: 03/09/23  2841     History  Chief Complaint  Patient presents with   [name]    Allison Norman is a 89 y.o. female.  Pt is a 89 yo female with pmhx significant for htn, afib (on eliquis), ckd, htn, breast cancer, and aortic stenosis.  Pt fell today and hit both of her knees.  She said she was trying to get out of bed and fell.  She denies hitting her head. She normally walks with a walker.  She was able to the phone to call EMS.        Home Medications Prior to Admission medications   Medication Sig Start Date End Date Taking? Authorizing Provider  acetaminophen (TYLENOL) 500 MG tablet Take 1,000 mg by mouth every 6 (six) hours as needed for moderate pain.    [provider]  Aflibercept (EYLEA) 2 MG/0.05ML SOLN 2 mg by Intravitreal route every 3 (three) months. Inject in R eye every 11 weeks per Retina specialist for Macular Degenration    [provider]  amiodarone (PACERONE) 200 MG tablet Take 1/2 (one-half) tablet by mouth once daily 02/13/23   Marinus Maw, MD  apixaban Everlene Balls) 2.5 MG TABS tablet Take 1 tablet by mouth twice daily 02/23/23   Chilton Si, MD  carvedilol (COREG) 25 MG tablet TAKE 1 TABLET BY MOUTH TWICE DAILY WITH A MEAL 09/22/22   Chilton Si, MD  diazepam (VALIUM) 2 MG tablet Take 2 mg by mouth at bedtime as needed for anxiety. 06/23/16   [provider]  famotidine (PEPCID) 40 MG tablet Take 40 mg by mouth daily.    [provider]  furosemide (LASIX) 20 MG tablet Take 1 tablet (20 mg total) by mouth daily. Can take an additional 20 mg tablet once a day as needed 03/04/23 06/02/23  Chilton Si, MD  Melatonin 10 MG CAPS Take 10 mg by mouth at bedtime.    [provider]  minoxidil (LONITEN) 2.5 MG tablet Take 1 tablet (2.5 mg total) by mouth daily. 02/02/23   Chilton Si, MD  montelukast  (SINGULAIR) 10 MG tablet Take 1 tablet (10 mg total) by mouth at bedtime. 03/25/22   Omar Person, MD  Multiple Vitamins-Minerals (PRESERVISION AREDS 2) CAPS Take 1 capsule by mouth daily.    [provider]  pantoprazole (PROTONIX) 40 MG tablet Take 1 tablet (40 mg total) by mouth daily. 09/20/21   Omar Person, MD  polyethylene glycol (MIRALAX / GLYCOLAX) 17 g packet Take 17 g by mouth daily. 09/09/20   Erick Blinks, MD  predniSONE (DELTASONE) 5 MG tablet Take 5 mg by mouth every other day. 12/03/21   [provider]  valsartan (DIOVAN) 160 MG tablet Take 160 mg by mouth as directed. 1/2 tablet every evening    [provider]      Allergies    Codeine, Penicillins, Doxazosin, Doxycycline, and Hydralazine    Review of Systems   Review of Systems  Musculoskeletal:        Bilateral knee pain  All other systems reviewed and are negative.   Physical Exam Updated Vital Signs BP 132/67   Pulse (!) 57   Temp 98.4 F (36.9 C)   Resp 15   Ht 5\' 5"  (1.651 m)   Wt 56.7 kg   SpO2 98%   BMI 20.80 kg/m  Physical Exam Vitals and  nursing note reviewed.  Constitutional:      Appearance: Normal appearance.  HENT:     Head: Normocephalic and atraumatic.     Right Ear: External ear normal.     Left Ear: External ear normal.     Nose: Nose normal.     Mouth/Throat:     Mouth: Mucous membranes are moist.     Pharynx: Oropharynx is clear.  Eyes:     Extraocular Movements: Extraocular movements intact.     Conjunctiva/sclera: Conjunctivae normal.     Pupils: Pupils are equal, round, and reactive to light.  Cardiovascular:     Rate and Rhythm: Normal rate and regular rhythm.     Pulses: Normal pulses.     Heart sounds: Normal heart sounds.  Pulmonary:     Effort: Pulmonary effort is normal.     Breath sounds: Normal breath sounds.  Abdominal:     General: Abdomen is flat. Bowel sounds are normal.     Palpations: Abdomen is soft.  Musculoskeletal:      Cervical back: Normal range of motion and neck supple.  Skin:    Capillary Refill: Capillary refill takes less than 2 seconds.     Comments: Large skin tears to bilateral knees.  Left skin tear with a small arteriole bleed.  Neurological:     General: No focal deficit present.     Mental Status: She is alert and oriented to person, place, and time.  Psychiatric:        Mood and Affect: Mood normal.        Behavior: Behavior normal.     ED Results / Procedures / Treatments   Labs (all labs ordered are listed, but only abnormal results are displayed) Labs Reviewed  CBC WITH DIFFERENTIAL/PLATELET - Abnormal; Notable for the following components:      Result Value   RBC 3.42 (*)    Hemoglobin 10.0 (*)    HCT 32.9 (*)    RDW 16.2 (*)    All other components within normal limits  COMPREHENSIVE METABOLIC PANEL - Abnormal; Notable for the following components:   Glucose, Bld 111 (*)    BUN 38 (*)    Creatinine, Ser 2.02 (*)    Calcium 8.2 (*)    Total Protein 6.1 (*)    Albumin 2.7 (*)    GFR, Estimated 22 (*)    All other components within normal limits  HEMOGLOBIN AND HEMATOCRIT, BLOOD - Abnormal; Notable for the following components:   Hemoglobin 9.6 (*)    HCT 31.1 (*)    All other components within normal limits  CULTURE, BLOOD (ROUTINE X 2)  CULTURE, BLOOD (ROUTINE X 2)  URINALYSIS, ROUTINE W REFLEX MICROSCOPIC  COMPREHENSIVE METABOLIC PANEL  CBC  CBC    EKG EKG Interpretation Date/Time:  Monday March 09 2023 10:57:07 EST Ventricular Rate:  69 PR Interval:  276 QRS Duration:  92 QT Interval:  470 QTC Calculation: 503 R Axis:   112  Text Interpretation: Atrial-paced rhythm with prolonged AV conduction Right axis deviation Biventricular hypertrophy T wave abnormality, consider inferior ischemia Prolonged QT Abnormal ECG When compared with ECG of 22-Dec-2022 15:40, PREVIOUS ECG IS PRESENT No significant change since last tracing Confirmed by Jacalyn Lefevre  [phone]) on 03/09/2023 2:32:07 PM  Radiology DG Pelvis 1-2 Views Result Date: 03/09/2023 CLINICAL DATA:  Pain. EXAM: PELVIS - 1-2 VIEW COMPARISON:  CT abdomen/pelvis dated 03/25/2021. FINDINGS: Diffuse osseous demineralization. Evaluation of the sacrum is limited due to overlying bowel-gas.  No acute fracture or diastasis identified. Mild-to-moderate degenerative changes of the bilateral hips. IMPRESSION: 1. No acute osseous abnormality identified. 2. Mild-to-moderate degenerative changes of the bilateral hips. Electronically Signed   By: Hart Robinsons M.D.   On: 03/09/2023 16:27   DG Chest 2 View Result Date: 03/09/2023 CLINICAL DATA:  Pain. EXAM: CHEST - 2 VIEW COMPARISON:  Chest radiograph dated 10/08/2022. FINDINGS: Stable cardiomegaly. Stable left chest wall pacemaker in place. Bilateral central perihilar interstitial prominence. Blunting of the left-greater-than-right costophrenic angles may reflect small bilateral pleural effusions. Mild bibasilar atelectasis. Retrocardiac opacities. No pneumothorax. Diffuse osseous demineralization. No acute osseous abnormality identified. IMPRESSION: 1. Cardiomegaly with findings suggestive of central pulmonary vascular congestion. 2. Small bilateral pleural effusions with bibasilar atelectasis. 3. Retrocardiac opacities could reflect atelectasis or infiltrate. Electronically Signed   By: Hart Robinsons M.D.   On: 03/09/2023 16:25   DG Knee Complete 4 Views Left Result Date: 03/09/2023 CLINICAL DATA:  Pain. EXAM: LEFT KNEE - COMPLETE 4+ VIEW COMPARISON:  None Available. FINDINGS: Diffuse osseous demineralization. No acute fracture, dislocation, or significant joint effusion. Mild-to-moderate degenerative changes of the knee with joint space narrowing and marginal osteophytosis most pronounced in the medial femorotibial compartment. Prepatellar soft tissue swelling. IMPRESSION: 1. No acute osseous abnormality. 2. Mild-to-moderate degenerative changes of the knee.  Electronically Signed   By: Hart Robinsons M.D.   On: 03/09/2023 16:17   DG Knee Complete 4 Views Right Result Date: 03/09/2023 CLINICAL DATA:  Knee pain EXAM: RIGHT KNEE - COMPLETE 4 VIEW COMPARISON:  None Available. FINDINGS: Postsurgical changes of right knee arthroplasty. Hardware appears intact and well seated. No evidence of acute displaced fracture, dislocation, or joint effusion. Diffuse osteopenia. Soft tissues are unremarkable. IMPRESSION: Postsurgical changes of right knee arthroplasty without evidence of hardware complication. Electronically Signed   By: Agustin Cree M.D.   On: 03/09/2023 16:13    Procedures Procedures    Medications Ordered in ED Medications  cefTRIAXone (ROCEPHIN) 1 g in sodium chloride 0.9 % 100 mL IVPB (has no administration in time range)  Melatonin CAPS 10 mg (has no administration in time range)  apixaban (ELIQUIS) tablet 2.5 mg (has no administration in time range)  pantoprazole (PROTONIX) EC tablet 40 mg (has no administration in time range)  famotidine (PEPCID) tablet 40 mg (has no administration in time range)  amiodarone (PACERONE) tablet 100 mg (has no administration in time range)  carvedilol (COREG) tablet 25 mg (has no administration in time range)  furosemide (LASIX) tablet 20 mg (has no administration in time range)  minoxidil (LONITEN) tablet 2.5 mg (has no administration in time range)  sodium chloride flush (NS) 0.9 % injection 3 mL (has no administration in time range)  acetaminophen (TYLENOL) tablet 650 mg (has no administration in time range)    Or  acetaminophen (TYLENOL) suppository 650 mg (has no administration in time range)  polyethylene glycol (MIRALAX / GLYCOLAX) packet 17 g (has no administration in time range)  cefTRIAXone (ROCEPHIN) 1 g in sodium chloride 0.9 % 100 mL IVPB (has no administration in time range)  traMADol (ULTRAM) tablet 25 mg (has no administration in time range)  sodium chloride 0.9 % bolus 500 mL (0 mLs  Intravenous Stopped 03/09/23 1139)  lidocaine-EPINEPHrine (XYLOCAINE W/EPI) 2 %-1:200000 (PF) injection 10 mL (10 mLs Infiltration Given 03/09/23 1032)  ondansetron (ZOFRAN) injection 4 mg (4 mg Intravenous Given 03/09/23 1139)  morphine (PF) 2 MG/ML injection 2 mg (2 mg Intravenous Given 03/09/23 1138)  morphine (PF) 4  MG/ML injection 4 mg (4 mg Intravenous Given 03/09/23 1420)    ED Course/ Medical Decision Making/ A&P                                 Medical Decision Making Amount and/or Complexity of Data Reviewed Labs: ordered. Radiology: ordered.  Risk Prescription drug management. Decision regarding hospitalization.   This patient presents to the ED for concern of fall, this involves an extensive number of treatment options, and is a complaint that carries with it a high risk of complications and morbidity.  The differential diagnosis includes multiple trauma   Co morbidities that complicate the patient evaluation  htn, afib (on eliquis), ckd, htn, breast cancer, and aortic stenosis   Additional history obtained:  Additional history obtained from epic chart review External records from outside source obtained and reviewed including EMS report/family   Lab Tests:  I Ordered, and personally interpreted labs.  The pertinent results include:  cbc with hgb 10.0 (hgb 12.4 in October) and dropped to 9.6, cmp with cr 2.02 (stable),    Imaging Studies ordered:  I ordered imaging studies including cxr, B knee, pelvis  I independently visualized and interpreted imaging which showed  R knee: Postsurgical changes of right knee arthroplasty without evidence of  hardware complication.   L knee: No acute osseous abnormality.  2. Mild-to-moderate degenerative changes of the knee.  CXR: Cardiomegaly with findings suggestive of central pulmonary  vascular congestion.  2. Small bilateral pleural effusions with bibasilar atelectasis.  3. Retrocardiac opacities could reflect atelectasis  or infiltrate.   Pelvis: No acute osseous abnormality identified.  2. Mild-to-moderate degenerative changes of the bilateral hips.   I agree with the radiologist interpretation   Cardiac Monitoring:  The patient was maintained on a cardiac monitor.  I personally viewed and interpreted the cardiac monitored which showed an underlying rhythm of: nsr   Medicines ordered and prescription drug management:  I ordered medication including morphine/zofran/rocephin  for sx  Reevaluation of the patient after these medicines showed that the patient improved I have reviewed the patients home medicines and have made adjustments as needed   Test Considered:  xr   Critical Interventions:  Lac repair/abx   Consultations Obtained:  I requested consultation with the hospitalists (Dr. Alinda Money),  and discussed lab and imaging findings as well as pertinent plan -he will admit   Problem List / ED Course:  Skin tear/lac/knee hematoma:  repaired by PA Smoot.  This has required re-dressing several times due to oozing from wound.  Pt will likely need PT as she walks with a walker and can hardly move her knees now.   Lymphangitis:  pt now has a red streak coming up her left thigh from the wound.  I have started her on abx.     Reevaluation:  After the interventions noted above, I reevaluated the patient and found that they have :improved   Social Determinants of Health:  Lives alone   Dispostion:  After consideration of the diagnostic results and the patients response to treatment, I feel that the patent would benefit from admission.          Final Clinical Impression(s) / ED Diagnoses Final diagnoses:  Fall, initial encounter  Laceration of left knee, initial encounter  Skin tear of lower leg without complication, left, initial encounter  Traumatic hematoma of left knee, initial encounter  Lymphangitis    Rx / DC  Orders ED Discharge Orders     None          Jacalyn Lefevre, MD 03/09/23 1719

## 2023-03-10 DIAGNOSIS — S81012A Laceration without foreign body, left knee, initial encounter: Secondary | ICD-10-CM | POA: Diagnosis not present

## 2023-03-10 DIAGNOSIS — S81019A Laceration without foreign body, unspecified knee, initial encounter: Secondary | ICD-10-CM | POA: Diagnosis not present

## 2023-03-10 LAB — COMPREHENSIVE METABOLIC PANEL
ALT: 18 U/L (ref 0–44)
AST: 16 U/L (ref 15–41)
Albumin: 2.4 g/dL — ABNORMAL LOW (ref 3.5–5.0)
Alkaline Phosphatase: 65 U/L (ref 38–126)
Anion gap: 8 (ref 5–15)
BUN: 30 mg/dL — ABNORMAL HIGH (ref 8–23)
CO2: 24 mmol/L (ref 22–32)
Calcium: 7.8 mg/dL — ABNORMAL LOW (ref 8.9–10.3)
Chloride: 109 mmol/L (ref 98–111)
Creatinine, Ser: 1.77 mg/dL — ABNORMAL HIGH (ref 0.44–1.00)
GFR, Estimated: 26 mL/min — ABNORMAL LOW (ref >60)
Glucose, Bld: 92 mg/dL (ref 70–99)
Potassium: 4.6 mmol/L (ref 3.5–5.1)
Sodium: 141 mmol/L (ref 135–145)
Total Bilirubin: 0.6 mg/dL (ref 0.0–1.2)
Total Protein: 5.3 g/dL — ABNORMAL LOW (ref 6.5–8.1)

## 2023-03-10 LAB — CBC
HCT: 27.3 % — ABNORMAL LOW (ref 36.0–46.0)
Hemoglobin: 8.3 g/dL — ABNORMAL LOW (ref 12.0–15.0)
MCH: 29.5 pg (ref 26.0–34.0)
MCHC: 30.4 g/dL (ref 30.0–36.0)
MCV: 97.2 fL (ref 80.0–100.0)
Platelets: 247 10*3/uL (ref 150–400)
RBC: 2.81 MIL/uL — ABNORMAL LOW (ref 3.87–5.11)
RDW: 16.3 % — ABNORMAL HIGH (ref 11.5–15.5)
WBC: 6.9 10*3/uL (ref 4.0–10.5)
nRBC: 0 % (ref 0.0–0.2)

## 2023-03-10 LAB — MRSA NEXT GEN BY PCR, NASAL: MRSA by PCR Next Gen: DETECTED — AB

## 2023-03-10 MED ORDER — IRON SUCROSE 200 MG IVPB - SIMPLE MED
200.0000 mg | Freq: Once | Status: AC
  Administered 2023-03-10: 200 mg via INTRAVENOUS
  Filled 2023-03-10: qty 200

## 2023-03-10 MED ORDER — CEPHALEXIN 500 MG PO CAPS
500.0000 mg | ORAL_CAPSULE | Freq: Two times a day (BID) | ORAL | Status: DC
  Administered 2023-03-10 – 2023-03-11 (×3): 500 mg via ORAL
  Filled 2023-03-10 (×4): qty 1

## 2023-03-10 MED ORDER — ACETAMINOPHEN 325 MG PO TABS: 650.0000 mg | ORAL_TABLET | Freq: Four times a day (QID) | ORAL | Status: DC | PRN

## 2023-03-10 MED ORDER — PANTOPRAZOLE SODIUM 40 MG PO TBEC: 40.0000 mg | DELAYED_RELEASE_TABLET | Freq: Every day | ORAL | 0 refills | Status: DC

## 2023-03-10 MED ORDER — TRAMADOL HCL 50 MG PO TABS: 25.0000 mg | ORAL_TABLET | Freq: Four times a day (QID) | ORAL | 0 refills | Status: AC | PRN

## 2023-03-10 NOTE — Progress Notes (Signed)
 PROGRESS NOTE    Allison Norman  NGE:[id number] DOB: 12/12/1924 DOA: 03/09/2023 PCP: Daisy Floro, MD    Brief Narrative:  89 year old with history of CKD stage IIIb, chronic A-fib, pacemaker in place, chronic anemia, presented from home with mechanical fall, she was trying to get out of her bed and fell on both her knees.  Significant both knees were injured.  Sutured in the ER, admitted for observation due to significant injury and disposition planning.  Subjective: Patient seen in the morning rounds.  Moderate pain better with tramadol 25 mg.  Mobilized with therapies.  Wants to go home once she has 24-hour care arranged at home.  Daughter at the bedside.  Agreeable for patient to go home.  They may have enough around-the-clock support tomorrow.  Assessment & Plan:   Mechanical injury Right infrapatellar skin abrasion Left prepatellar skin tear status post sutures Left infrapatellar skin avulsion.  Currently stable.  Sutured in the ER.  Nonstick he dressing and compression bandage.  Work with PT OT.  Pain management.  Given significant operation, will use antibiotics for 7 days.  Received 1 dose of Rocephin.  Keflex 500 mg twice daily for 6 days. Patient will need dressing changes at home, family can do it.  Will need suture removal, can follow-up at PCP in 2 weeks.  Acute blood loss anemia: Recent known baseline hemoglobin of 10.  Hemoglobin 8.3 today.  Currently no evidence of active bleeding.  Does not want oral iron therapy.  Will give iron sucrose x 1 today 200 mg.  Chronic A-fib, a flutter, sick sinus syndrome status post pacemaker: Patient is fairly stable.  She is on amiodarone and carvedilol.  In sinus rhythm now.  Will hold Eliquis for 2 days given significant bleeding.  Chronic diastolic heart failure: Fairly stable.  On home dose of carvedilol and Lasix.  PMR: On chronic prednisone therapy.  History of PE: Holding Eliquis per 48 hours.  Currently no indication for  bridging.  Pulmonary embolism was more than 2 years ago.  DVT prophylaxis: Eliquis, on hold   Code Status: Full code Family Communication: Daughter at the bedside Disposition Plan: Status is: Observation The patient will require care spanning > 2 midnights and should be moved to inpatient because: Significant injury, needs monitoring     Consultants:  None  Procedures:  Suturing left knee  Antimicrobials:  Rocephin 1 g-- Keflex 3/4---     Objective: Vitals:   03/09/23 1853 03/09/23 1950 03/10/23 0459 03/10/23 0748  BP: (!) 121/53 (!) 110/49 (!) 104/52 (!) 138/56  Pulse: 60 (!) 59 60 60  Resp: 18   17  Temp: 98.2 F (36.8 C) 98.3 F (36.8 C) 97.7 F (36.5 C) 97.8 F (36.6 C)  TempSrc: Oral Oral Oral Oral  SpO2: 97% 96% 95% 94%  Weight:      Height:        Intake/Output Summary (Last 24 hours) at 03/10/2023 1240 Last data filed at 03/10/2023 0900 Gross per 24 hour  Intake 220 ml  Output 750 ml  Net -530 ml   Filed Weights   03/09/23 0945  Weight: 56.7 kg    Examination:  General exam: Appears calm and comfortable  Patient is pleasant interactive.  Alert awake and oriented.  Looks younger than his stated age. Respiratory system: Clear to auscultation. Respiratory effort normal. Cardiovascular system: S1 & S2 heard, RRR.  Pacemaker in place. Gastrointestinal system: Abdomen is nondistended, soft and nontender. No organomegaly or masses felt.  Normal bowel sounds heard. Central nervous system: Alert and oriented. No focal neurological deficits. Extremities: Symmetric 5 x 5 power. Skin:  Right infrapatellar region she has a skin avulsion.  No active bleeding. Left anterior knee suture line with some oozing. Left infrapatellar region skin avulsion without bleeding.    Data Reviewed: I have personally reviewed following labs and imaging studies  CBC: Recent Labs  Lab 03/09/23 1030 03/09/23 1410 03/09/23 2104 03/10/23 0732  WBC 6.4  --  7.7 6.9   NEUTROABS 4.5  --   --   --   HGB 10.0* 9.6* 9.2* 8.3*  HCT 32.9* 31.1* 29.7* 27.3*  MCV 96.2  --  95.2 97.2  PLT 265  --  276 247   Basic Metabolic Panel: Recent Labs  Lab 03/09/23 1030 03/10/23 0732  NA 142 141  K 4.5 4.6  CL 106 109  CO2 22 24  GLUCOSE 111* 92  BUN 38* 30*  CREATININE 2.02* 1.77*  CALCIUM 8.2* 7.8*   GFR: Estimated Creatinine Clearance: 16.6 mL/min (A) (by C-G formula based on SCr of 1.77 mg/dL (H)). Liver Function Tests: Recent Labs  Lab 03/09/23 1030 03/10/23 0732  AST 24 16  ALT 22 18  ALKPHOS 83 65  BILITOT 0.7 0.6  PROT 6.1* 5.3*  ALBUMIN 2.7* 2.4*   No results for input(s): "LIPASE", "AMYLASE" in the last 168 hours. No results for input(s): "AMMONIA" in the last 168 hours. Coagulation Profile: No results for input(s): "INR", "PROTIME" in the last 168 hours. Cardiac Enzymes: No results for input(s): "CKTOTAL", "CKMB", "CKMBINDEX", "TROPONINI" in the last 168 hours. BNP (last 3 results) No results for input(s): "PROBNP" in the last 8760 hours. HbA1C: No results for input(s): "HGBA1C" in the last 72 hours. CBG: No results for input(s): "GLUCAP" in the last 168 hours. Lipid Profile: No results for input(s): "CHOL", "HDL", "LDLCALC", "TRIG", "CHOLHDL", "LDLDIRECT" in the last 72 hours. Thyroid Function Tests: No results for input(s): "TSH", "T4TOTAL", "FREET4", "T3FREE", "THYROIDAB" in the last 72 hours. Anemia Panel: No results for input(s): "VITAMINB12", "FOLATE", "FERRITIN", "TIBC", "IRON", "RETICCTPCT" in the last 72 hours. Sepsis Labs: No results for input(s): "PROCALCITON", "LATICACIDVEN" in the last 168 hours.  Recent Results (from the past 240 hours)  Culture, blood (routine x 2)     Status: None (Preliminary result)   Collection Time: 03/09/23  9:04 PM   Specimen: BLOOD RIGHT ARM  Result Value Ref Range Status   Specimen Description BLOOD RIGHT ARM  Final   Special Requests   Final    BOTTLES DRAWN AEROBIC ONLY Blood  Culture results may not be optimal due to an inadequate volume of blood received in culture bottles   Culture   Final    NO GROWTH < 12 HOURS Performed at [facility], [address] [place], [place] [address]    Report Status PENDING  Incomplete  Culture, blood (routine x 2)     Status: None (Preliminary result)   Collection Time: 03/09/23  9:06 PM   Specimen: BLOOD RIGHT ARM  Result Value Ref Range Status   Specimen Description BLOOD RIGHT ARM  Final   Special Requests   Final    BOTTLES DRAWN AEROBIC ONLY Blood Culture results may not be optimal due to an inadequate volume of blood received in culture bottles   Culture   Final    NO GROWTH < 12 HOURS Performed at [facility], [address], [place], [place] [address]  Report Status PENDING  Incomplete  MRSA Next Gen by PCR, Nasal     Status: Abnormal   Collection Time: 03/10/23 10:26 AM   Specimen: Nasal Mucosa; Nasal Swab  Result Value Ref Range Status   MRSA by PCR Next Gen DETECTED (A) NOT DETECTED Final    Comment: RESULT CALLED TO, READ BACK BY AND VERIFIED WITH: GINA DAVANNA 3/4@1217  MSG (NOTE) The GeneXpert MRSA Assay (FDA approved for NASAL specimens only), is one component of a comprehensive MRSA colonization surveillance program. It is not intended to diagnose MRSA infection nor to guide or monitor treatment for MRSA infections. Test performance is not FDA approved in patients less than 62 years old. Performed at [facility], [address] [place], [place] [address]          Radiology Studies: DG Pelvis 1-2 Views Result Date: 03/09/2023 CLINICAL DATA:  Pain. EXAM: PELVIS - 1-2 VIEW COMPARISON:  CT abdomen/pelvis dated 03/25/2021. FINDINGS: Diffuse osseous demineralization. Evaluation of the sacrum is limited due to overlying bowel-gas. No acute fracture or diastasis identified. Mild-to-moderate degenerative changes of the bilateral hips. IMPRESSION: 1. No acute osseous abnormality  identified. 2. Mild-to-moderate degenerative changes of the bilateral hips. Electronically Signed   By: Hart Robinsons M.D.   On: 03/09/2023 16:27   DG Chest 2 View Result Date: 03/09/2023 CLINICAL DATA:  Pain. EXAM: CHEST - 2 VIEW COMPARISON:  Chest radiograph dated 10/08/2022. FINDINGS: Stable cardiomegaly. Stable left chest wall pacemaker in place. Bilateral central perihilar interstitial prominence. Blunting of the left-greater-than-right costophrenic angles may reflect small bilateral pleural effusions. Mild bibasilar atelectasis. Retrocardiac opacities. No pneumothorax. Diffuse osseous demineralization. No acute osseous abnormality identified. IMPRESSION: 1. Cardiomegaly with findings suggestive of central pulmonary vascular congestion. 2. Small bilateral pleural effusions with bibasilar atelectasis. 3. Retrocardiac opacities could reflect atelectasis or infiltrate. Electronically Signed   By: Hart Robinsons M.D.   On: 03/09/2023 16:25   DG Knee Complete 4 Views Left Result Date: 03/09/2023 CLINICAL DATA:  Pain. EXAM: LEFT KNEE - COMPLETE 4+ VIEW COMPARISON:  None Available. FINDINGS: Diffuse osseous demineralization. No acute fracture, dislocation, or significant joint effusion. Mild-to-moderate degenerative changes of the knee with joint space narrowing and marginal osteophytosis most pronounced in the medial femorotibial compartment. Prepatellar soft tissue swelling. IMPRESSION: 1. No acute osseous abnormality. 2. Mild-to-moderate degenerative changes of the knee. Electronically Signed   By: Hart Robinsons M.D.   On: 03/09/2023 16:17   DG Knee Complete 4 Views Right Result Date: 03/09/2023 CLINICAL DATA:  Knee pain EXAM: RIGHT KNEE - COMPLETE 4 VIEW COMPARISON:  None Available. FINDINGS: Postsurgical changes of right knee arthroplasty. Hardware appears intact and well seated. No evidence of acute displaced fracture, dislocation, or joint effusion. Diffuse osteopenia. Soft tissues are  unremarkable. IMPRESSION: Postsurgical changes of right knee arthroplasty without evidence of hardware complication. Electronically Signed   By: Agustin Cree M.D.   On: 03/09/2023 16:13        Scheduled Meds:  amiodarone  100 mg Oral Daily   carvedilol  25 mg Oral BID WC   famotidine  40 mg Oral Daily   furosemide  20 mg Oral Daily   melatonin  10 mg Oral QHS   minoxidil  2.5 mg Oral Daily   pantoprazole  40 mg Oral Daily   sodium chloride flush  3 mL Intravenous Q12H   Continuous Infusions:  iron sucrose       LOS: 0 days    Time spent: 40 minutes  Dorcas Carrow, MD [facility]

## 2023-03-10 NOTE — Evaluation (Signed)
 Occupational Therapy Evaluation Patient Details Name: Allison Norman MRN: [id number] DOB: Jul 04, 1924 Today's Date: 03/10/2023   History of Present Illness   Patient is a 89 year old female presenting after fall at home. Found to have deep laceration of knee requiring repair in the ED. History of CKD 3B, atrial fibrillation, atrial flutter, bradycardia, pacemaker in place, anemia, diastolic CHF, macular degeneration, PMR, PE     Clinical Impressions Pt c/o mild pain to B knees at rest, random muscle spasms to L knee increasing pain at times. Pt daughter present during session. Pt lives alone, level entry, has PCA 8hrs/day at baseline for IADLs. PLOF independent with ADLs. Pt currently requires min A for bed mobility, transfers, and mobility using RW to maintain balance due to frequent LOB. Pt requires assistance with LB ADLs due to pain in knees.  Pt and daughter plan to return home and are setting up 24/7 care assistance, instructed on safety awareness and importance of using RW over rollator for maximal stability. Pt would benefit from further acute OT, Pt and daughter deny need for follow up OT, no follow up needed. They state they have all DME needed to remain safe and independent.      If plan is discharge home, recommend the following:   A little help with walking and/or transfers;A little help with bathing/dressing/bathroom;Assistance with cooking/housework;Assistance with feeding;Assist for transportation;Help with stairs or ramp for entrance     Functional Status Assessment   Patient has had a recent decline in their functional status and demonstrates the ability to make significant improvements in function in a reasonable and predictable amount of time.     Equipment Recommendations   None recommended by OT     Recommendations for Other Services         Precautions/Restrictions   Precautions Precautions: Fall Recall of Precautions/Restrictions:  Intact Restrictions Weight Bearing Restrictions Per Provider Order: No     Mobility Bed Mobility Overal bed mobility: Needs Assistance Bed Mobility: Supine to Sit, Sit to Supine     Supine to sit: Min assist Sit to supine: Min assist   General bed mobility comments: min A in/out of bed for BLEs    Transfers Overall transfer level: Needs assistance Equipment used: Rolling walker (2 wheels) Transfers: Sit to/from Stand Sit to Stand: Min assist           General transfer comment: min A to maintain balance with RW      Balance Overall balance assessment: Needs assistance Sitting-balance support: No upper extremity supported, Feet supported Sitting balance-Leahy Scale: Fair     Standing balance support: Bilateral upper extremity supported, During functional activity, Reliant on assistive device for balance Standing balance-Leahy Scale: Poor Standing balance comment: reliant on RW for support, min A from therapist to maintain balance                           ADL either performed or assessed with clinical judgement   ADL Overall ADL's : Needs assistance/impaired Eating/Feeding: Independent   Grooming: Supervision/safety;Standing   Upper Body Bathing: Set up;Sitting   Lower Body Bathing: Set up;Sitting/lateral leans   Upper Body Dressing : Set up;Sitting   Lower Body Dressing: Minimal assistance;Sit to/from stand   Toilet Transfer: Minimal assistance;Rolling walker (2 wheels)   Toileting- Clothing Manipulation and Hygiene: Minimal assistance;Sitting/lateral lean;Sit to/from stand   Tub/ Shower Transfer: Minimal assistance;Rolling walker (2 wheels);Shower seat   Functional mobility during ADLs: Minimal assistance;Rolling  walker (2 wheels) General ADL Comments: Pt min A for LB ADLs and mobility to maintain balance. Pt set up for UB ADLs.     Vision Baseline Vision/History: 6 Macular Degeneration;1 Wears glasses Ability to See in Adequate Light: 1  Impaired Patient Visual Report: No change from baseline       Perception         Praxis         Pertinent Vitals/Pain Pain Assessment Pain Assessment: 0-10 Pain Score: 2  Pain Location: bilateral knees Pain Descriptors / Indicators: Discomfort, Grimacing Pain Intervention(s): Monitored during session     Extremity/Trunk Assessment Upper Extremity Assessment Upper Extremity Assessment: Overall WFL for tasks assessed   Lower Extremity Assessment Lower Extremity Assessment: Defer to PT evaluation       Communication Communication Communication: No apparent difficulties   Cognition Arousal: Alert Behavior During Therapy: WFL for tasks assessed/performed                                 Following commands: Intact       Cueing  General Comments   Cueing Techniques: Verbal cues  patient is fatigued with activity. she reports she is not interested in going to any rehab facility but is planning to arrange 24/7 care and is open to home health   Exercises     Shoulder Instructions      Home Living Family/patient expects to be discharged to:: Private residence Living Arrangements: Alone Available Help at Discharge: Personal care attendant;Available PRN/intermittently Type of Home: House Home Access: Level entry     Home Layout: One level     Bathroom Shower/Tub: Producer, television/film/video: Handicapped height     Home Equipment: Agricultural consultant (2 wheels);Rollator (4 wheels);Shower seat;BSC/3in1   Additional Comments: caregivers for 8 hours during the day. No assistance at night time currently, plans to have assistance 24/7      Prior Functioning/Environment Prior Level of Function : Needs assist;History of Falls (last six months)             Mobility Comments: Mod I with 4 wheeled walker ADLs Comments: patient has caregivers that assist with cleaning the home. She reports she is independent with bathing and dressing but could have  assistance if needed    OT Problem List: Decreased strength;Decreased range of motion;Decreased activity tolerance;Impaired balance (sitting and/or standing);Pain   OT Treatment/Interventions: Self-care/ADL training;Therapeutic exercise;Energy conservation;DME and/or AE instruction;Therapeutic activities;Balance training;Patient/family education      OT Goals(Current goals can be found in the care plan section)   Acute Rehab OT Goals Patient Stated Goal: to return home OT Goal Formulation: With patient/family Time For Goal Achievement: 03/24/23 Potential to Achieve Goals: Good   OT Frequency:  Min 2X/week    Co-evaluation              AM-PAC OT "6 Clicks" Daily Activity     Outcome Measure Help from another person eating meals?: None Help from another person taking care of personal grooming?: A Little Help from another person toileting, which includes using toliet, bedpan, or urinal?: A Little Help from another person bathing (including washing, rinsing, drying)?: A Little Help from another person to put on and taking off regular upper body clothing?: A Little Help from another person to put on and taking off regular lower body clothing?: A Little 6 Click Score: 19   End of Session Equipment Utilized During Treatment:  Gait belt;Rolling walker (2 wheels) Nurse Communication: Mobility status  Activity Tolerance: Patient tolerated treatment well Patient left: in bed;with call bell/phone within reach;with family/visitor present  OT Visit Diagnosis: Unsteadiness on feet (R26.81);Other abnormalities of gait and mobility (R26.89);Muscle weakness (generalized) (M62.81);Pain Pain - part of body: Knee                Time: 1210-1239 OT Time Calculation (min): 29 min Charges:  OT General Charges $OT Visit: 1 Visit OT Evaluation $OT Eval Low Complexity: 1 Low OT Treatments $Self Care/Home Management : 8-22 mins  [place], OTR/L   Alexis Goodell 03/10/2023, 1:05 PM

## 2023-03-10 NOTE — TOC Initial Note (Addendum)
 Transition of Care (TOC) - Initial/Assessment Note   Spoke to patient at bedside. Patient from home alone. Has walker at home. Patient has caregivers through the day, however she is alone at night. She plans to have 24/7 caregivers when she discharges from hospital.   await PT recommendations    1220 Orders for HHPT/OT/RN. Patient's daughter Kelton Pillar at bedside. They prefer [facility]. Kandee Keen with [facility] accepted referral.  Patient Details  Name: Allison Norman MRN: [id number] Date of Birth: 13-Oct-1924  [facility]) CM/SW Contact:    Kingsley Plan, RN Phone Number: 03/10/2023, 10:18 AM  Clinical Narrative:                   Expected Discharge Plan:  (await PT recommendations) Barriers to Discharge: Continued Medical Work up   Patient Goals and CMS Choice Patient states their goals for this hospitalization and ongoing recovery are:: to return home          Expected Discharge Plan and Services   Discharge Planning Services: CM Consult Post Acute Care Choice: NA Living arrangements for the past 2 months: Single Family Home                 DME Arranged:  (await PT recommendations) DME Agency:  (await PT recommendations)       HH Arranged:  (await PT recommendations)          Prior Living Arrangements/Services Living arrangements for the past 2 months: Single Family Home Lives with:: Self Patient language and need for interpreter reviewed:: Yes Do you feel safe going back to the place where you live?: Yes      Need for Family Participation in Patient Care: Yes (Comment) Care giver support system in place?: Yes (comment) Current home services: DME Criminal Activity/Legal Involvement Pertinent to Current Situation/Hospitalization: No - Comment as needed  Activities of Daily Living   ADL Screening (condition at time of admission) Independently performs ADLs?: No Does the patient have a NEW difficulty with  bathing/dressing/toileting/self-feeding that is expected to last >3 days?: No (needs assist) Does the patient have a NEW difficulty with getting in/out of bed, walking, or climbing stairs that is expected to last >3 days?: No (needs assist) Does the patient have a NEW difficulty with communication that is expected to last >3 days?: No Is the patient deaf or have difficulty hearing?: Yes Does the patient have difficulty seeing, even when wearing glasses/contacts?: Yes Does the patient have difficulty concentrating, remembering, or making decisions?: No  Permission Sought/Granted   Permission granted to share information with : No              Emotional Assessment Appearance:: Appears stated age Attitude/Demeanor/Rapport: Engaged Affect (typically observed): Appropriate Orientation: : Oriented to Self, Oriented to Place, Oriented to  Time, Oriented to Situation Alcohol / Substance Use: Not Applicable Psych Involvement: No (comment)  Admission diagnosis:  Lymphangitis [I89.1] Fall, initial encounter [W19.XXXA] Laceration of left knee, initial encounter [S81.012A] Skin tear of lower leg without complication, left, initial encounter [S81.812A] Traumatic hematoma of left knee, initial encounter [S80.02XA] Deep laceration of knee [S81.019A] Patient Active Problem List   Diagnosis Date Noted   Anemia 03/09/2023   Cellulitis 03/09/2023   Deep laceration of knee 03/09/2023   Aortic stenosis 08/20/2021   Blepharitis of lower eyelids of both eyes 07/23/2021   Protein-calorie malnutrition, severe 03/27/2021   History of pulmonary embolus (PE) 03/24/2021   Pulmonary nodule 03/24/2021   Chronic diastolic  CHF (congestive heart failure) (HCC) 03/24/2021   Pulmonary hypertension, unspecified (HCC) 02/14/2021   Vitreomacular traction syndrome of right eye 01/28/2021   Iron deficiency anemia 09/07/2020   Stage 3b chronic kidney disease (CKD) (HCC)    Advanced nonexudative age-related macular  degeneration of right eye without subfoveal involvement 03/15/2020   Pacemaker 11/01/2019   Tachycardia-bradycardia syndrome (HCC) 08/05/2019   Bradycardia 07/05/2019   Meibomian blepharitis, left 06/28/2019   Exudative age-related macular degeneration of right eye with active choroidal neovascularization (HCC) 05/03/2019   Cystoid macular edema of right eye 05/03/2019   Advanced nonexudative age-related macular degeneration of left eye with subfoveal involvement 05/03/2019   Fusion beats 08/03/2017   Atypical atrial flutter (HCC)    On anticoagulant therapy 07/10/2016   PAF (paroxysmal atrial fibrillation) (HCC)    Essential hypertension 07/16/2009   TEMPORAL ARTERITIS 07/16/2009   Polymyalgia rheumatica (HCC) 07/16/2009   PYOGENIC ARTHRITIS, LOWER LEG 07/02/2009   PCP:  Daisy Floro, MD Pharmacy:   [facility] [address] [place] - [address] [place] [address] Phone: [phone] Fax: [phone]     Social Drivers of Health (SDOH) Social History: SDOH Screenings   Food Insecurity: No Food Insecurity (03/09/2023)  Housing: Low Risk  (03/09/2023)  Transportation Needs: No Transportation Needs (03/09/2023)  Utilities: Not At Risk (03/09/2023)  Social Connections: Socially Isolated (03/09/2023)  Tobacco Use: Medium Risk (03/09/2023)   SDOH Interventions:     Readmission Risk Interventions     No data to display

## 2023-03-10 NOTE — Evaluation (Signed)
 Physical Therapy Evaluation Patient Details Name: Allison Norman MRN: [id number] DOB: [birth year] Today's Date: 03/10/2023  History of Present Illness  Patient is a 89 year old female presenting after fall at home. Found to have deep laceration of knee requiring repair in the ED. History of CKD 3B, atrial fibrillation, atrial flutter, bradycardia, pacemaker in place, anemia, diastolic CHF, macular degeneration, PMR, PE   Clinical Impression  Patient is agreeable to PT session. She lives alone at home with daytime caregiver support. She is Mod I with ambulation with a 4 wheeled walker at baseline. She reports other than this fall, her last fall was around a year ago.   Today the patient required CGA for bed mobility and for standing from various surfaces. Patient ambulated in the room with rolling walker with occasional steadying assistance provided. Mild increased knee pain reported with standing. She was fatigued with activity. The patient reports she wants to return home with caregiver support. Anticipate the need for initial 24/7 care at home. Home health is recommended. PT will continue to follow to maximize independence and decrease caregiver burden.       If plan is discharge home, recommend the following: A little help with walking and/or transfers;A little help with bathing/dressing/bathroom;Help with stairs or ramp for entrance;Assistance with cooking/housework;Assist for transportation   Can travel by private vehicle        Equipment Recommendations None recommended by PT  Recommendations for Other Services       Functional Status Assessment Patient has had a recent decline in their functional status and demonstrates the ability to make significant improvements in function in a reasonable and predictable amount of time.     Precautions / Restrictions Precautions Precautions: Fall Recall of Precautions/Restrictions: Intact Restrictions Weight Bearing Restrictions Per Provider  Order: No      Mobility  Bed Mobility Overal bed mobility: Needs Assistance Bed Mobility: Supine to Sit, Sit to Supine     Supine to sit: Contact guard Sit to supine: Contact guard assist   General bed mobility comments: increased time required    Transfers Overall transfer level: Needs assistance Equipment used: Rolling walker (2 wheels) Transfers: Sit to/from Stand Sit to Stand: Contact guard assist           General transfer comment: CGA for standing from bed in lowest position and from toilet. verbal cues for LLE positioning for comfort with standing and sitting    Ambulation/Gait Ambulation/Gait assistance: Contact guard assist, Min assist Gait Distance (Feet): 25 Feet Assistive device: Rolling walker (2 wheels) Gait Pattern/deviations: Step-through pattern, Step-to pattern, Decreased stride length, Staggering right Gait velocity: decreased     General Gait Details: intermittent steadying assistance provided with walking. mild increased knee pain with mobility  Stairs            Wheelchair Mobility     Tilt Bed    Modified Rankin (Stroke Patients Only)       Balance Overall balance assessment: Needs assistance Sitting-balance support: Feet supported, No upper extremity supported Sitting balance-Leahy Scale: Fair     Standing balance support: Bilateral upper extremity supported Standing balance-Leahy Scale: Poor Standing balance comment: heavy reliance on rolling walker for support with occasional external support provided from therapist                             Pertinent Vitals/Pain Pain Assessment Pain Assessment: Faces Faces Pain Scale: Hurts little more Pain Location: bilateral knees  Pain Descriptors / Indicators: Discomfort, Grimacing Pain Intervention(s): Limited activity within patient's tolerance, Monitored during session, Repositioned    Home Living Family/patient expects to be discharged to:: Private  residence Living Arrangements: Alone Available Help at Discharge: Personal care attendant;Available PRN/intermittently Type of Home:  [facility]) Home Access: Level entry       Home Layout: One level Home Equipment: Agricultural consultant (2 wheels);Rollator (4 wheels) Additional Comments: caregivers for 8 hours during the day. No assistance at night time currently    Prior Function Prior Level of Function : Needs assist;History of Falls (last six months)             Mobility Comments: Mod I with 4 wheeled walker ADLs Comments: patient has caregivers that assist with cleaning the home. She reports she is independent with bathing and dressing but could have assistance if needed     Extremity/Trunk Assessment   Upper Extremity Assessment Upper Extremity Assessment: Defer to OT evaluation    Lower Extremity Assessment Lower Extremity Assessment: Generalized weakness (intermittent guarding with knee flexion for comfort)       Communication   Communication Communication: No apparent difficulties    Cognition Arousal: Alert Behavior During Therapy: WFL for tasks assessed/performed   PT - Cognitive impairments: No apparent impairments                         Following commands: Intact       Cueing Cueing Techniques: Verbal cues     General Comments General comments (skin integrity, edema, etc.): patient is fatigued with activity. she reports she is not interested in going to any rehab facility but is planning to arrange 24/7 care and is open to home health    Exercises     Assessment/Plan    PT Assessment Patient needs continued PT services  PT Problem List Decreased strength;Decreased range of motion;Decreased balance;Decreased activity tolerance;Decreased mobility;Decreased safety awareness;Pain       PT Treatment Interventions DME instruction;Gait training;Stair training;Functional mobility training;Therapeutic activities;Therapeutic  exercise;Neuromuscular re-education;Balance training    PT Goals (Current goals can be found in the Care Plan section)  Acute Rehab PT Goals Patient Stated Goal: to go home with 24/7 support PT Goal Formulation: With patient Time For Goal Achievement: 03/24/23 Potential to Achieve Goals: Good    Frequency Min 2X/week     Co-evaluation               AM-PAC PT "6 Clicks" Mobility  Outcome Measure Help needed turning from your back to your side while in a flat bed without using bedrails?: None Help needed moving from lying on your back to sitting on the side of a flat bed without using bedrails?: A Little Help needed moving to and from a bed to a chair (including a wheelchair)?: A Little Help needed standing up from a chair using your arms (e.g., wheelchair or bedside chair)?: A Little Help needed to walk in hospital room?: A Little Help needed climbing 3-5 steps with a railing? : A Lot 6 Click Score: 18    End of Session Equipment Utilized During Treatment: Gait belt Activity Tolerance: Patient tolerated treatment well;Patient limited by fatigue Patient left: in bed;with call bell/phone within reach;with bed alarm set Nurse Communication: Mobility status PT Visit Diagnosis: Unsteadiness on feet (R26.81);Muscle weakness (generalized) (M62.81)    Time: 1610-9604 PT Time Calculation (min) (ACUTE ONLY): 34 min   Charges:   PT Evaluation $PT Eval Moderate Complexity: 1 Mod  PT General Charges $$ ACUTE PT VISIT: 1 Visit        Donna Bernard, PT, MPT   Ina Homes 03/10/2023, 11:28 AM

## 2023-03-10 NOTE — Plan of Care (Signed)
  Problem: Education: Goal: Knowledge of General Education information will improve Description: Including pain rating scale, medication(s)/side effects and non-pharmacologic comfort measures Outcome: Progressing   Problem: Health Behavior/Discharge Planning: Goal: Ability to manage health-related needs will improve Outcome: Progressing   Problem: Clinical Measurements: Goal: Ability to maintain clinical measurements within normal limits will improve Outcome: Progressing Goal: Will remain free from infection Outcome: Progressing Goal: Diagnostic test results will improve Outcome: Progressing Goal: Respiratory complications will improve Outcome: Progressing Goal: Cardiovascular complication will be avoided Outcome: Progressing   Problem: Activity: Goal: Risk for activity intolerance will decrease Outcome: Progressing   Problem: Nutrition: Goal: Adequate nutrition will be maintained Outcome: Progressing   Problem: Coping: Goal: Level of anxiety will decrease Outcome: Progressing   Problem: Elimination: Goal: Will not experience complications related to bowel motility Outcome: Progressing Goal: Will not experience complications related to urinary retention Outcome: Progressing   Problem: Pain Managment: Goal: General experience of comfort will improve and/or be controlled Outcome: Progressing   Problem: Safety: Goal: Ability to remain free from injury will improve Outcome: Progressing   Problem: Skin Integrity: Goal: Risk for impaired skin integrity will decrease Outcome: Progressing    Pt stated she hasn't voiding since the morning of 3/3, then grandaughter called stating pt has not voided since the night before, BS pt @2200  for 257.  BS @0145  for 475, notified on call doctor, instructed to [place] pt, 750 out.

## 2023-03-10 NOTE — Consult Note (Signed)
 WOC Nurse Consult Note: Reason for Consult: requested to assess bilateral skin tear on the knees after fall. Wound type: Skin tear type 2. Sutures on the left side, skin tear type 2 on the base of the left knee. Pressure Injury POA: NA Measurement: Left knee: 1 - sutures 8 x 5 cm Type 2 skin tear - 10 x 4.5 cm Right knee - skin tear type 2 - 5 x 6.2 cm Wound bed: 100% red. Drainage (amount, consistency, odor) Minimum amount, serosanguinous. Periwound: intact, bruises surrounding the sutures. Dressing procedure/placement/frequency: Apply non adherent dressing (petrolatum) on the skin tear and non adherent pad on the suture. Change daily.  WOC team will not plan to follow further.  Please reconsult if further assistance is needed. Thank-you,  Denyse Amass BSN, RN, [employer], WOC  (Pager: [phone])

## 2023-03-10 NOTE — TOC CAGE-AID Note (Signed)
 Transition of Care [facility]) - CAGE-AID Screening  Patient Details  Name: Allison Norman MRN: [id number] Date of Birth: 1924/06/23  Clinical Narrative:  Patient denies any alcohol or drug use, no need for substance abuse resources at this time.  CAGE-AID Screening:   Have You Ever Felt You Ought to Cut Down on Your Drinking or Drug Use?: No Have People Annoyed You By Critizing Your Drinking Or Drug Use?: No Have You Felt Bad Or Guilty About Your Drinking Or Drug Use?: No Have You Ever Had a Drink or Used Drugs First Thing In The Morning to Steady Your Nerves or to Get Rid of a Hangover?: No CAGE-AID Score: 0  Substance Abuse Education Offered: No

## 2023-03-10 NOTE — Care Management Obs Status (Signed)
 MEDICARE OBSERVATION STATUS NOTIFICATION   Patient Details  Name: Allison Norman MRN: [id number] Date of Birth: 28-Sep-1924   Medicare Observation Status Notification Given:  Yes    Kingsley Plan, RN 03/10/2023, 10:15 AM

## 2023-03-11 ENCOUNTER — Other Ambulatory Visit (HOSPITAL_COMMUNITY): Payer: Self-pay

## 2023-03-11 DIAGNOSIS — I891 Lymphangitis: Secondary | ICD-10-CM

## 2023-03-11 DIAGNOSIS — S81019A Laceration without foreign body, unspecified knee, initial encounter: Secondary | ICD-10-CM | POA: Diagnosis not present

## 2023-03-11 DIAGNOSIS — W19XXXA Unspecified fall, initial encounter: Secondary | ICD-10-CM | POA: Diagnosis not present

## 2023-03-11 DIAGNOSIS — S81012A Laceration without foreign body, left knee, initial encounter: Secondary | ICD-10-CM | POA: Diagnosis not present

## 2023-03-11 MED ORDER — FAMOTIDINE 20 MG PO TABS: 20.0000 mg | ORAL_TABLET | Freq: Every day | ORAL | Status: DC

## 2023-03-11 MED ORDER — CEPHALEXIN 500 MG PO CAPS
500.0000 mg | ORAL_CAPSULE | Freq: Two times a day (BID) | ORAL | 0 refills | Status: AC
  Filled 2023-03-11: qty 14, 7d supply, fill #0

## 2023-03-11 NOTE — Progress Notes (Signed)
 Mobility Specialist Progress Note:  Nurse requested Mobility Specialist to perform oxygen saturation test with pt which includes removing pt from oxygen both at rest and while ambulating.  Below are the results from that testing.     Patient Saturations on Room Air at Rest = spO2 87% required 1L/min to keep SPO2 above 88%  Patient Saturations on 1 Liters of oxygen while Ambulating = sp02 96%  At end of testing pt left in room on 1  Liters of oxygen.  Reported results to nurse.    Thompson Grayer Mobility Specialist  Please contact vis Secure Chat or  Rehab Office [phone]

## 2023-03-11 NOTE — Discharge Summary (Signed)
 Physician Discharge Summary   Patient: Allison Norman MRN: [id number] DOB: 01/31/1924  Admit date:     03/09/2023  Discharge date: 03/11/23  Discharge Physician: Rickey Barbara   PCP: Daisy Floro, MD   Recommendations at discharge:    Follow up with PCP in 1-2 weeks Follow up with Wound Care as scheduled  Follow up with Orthopedic Surgery as needed Recommend suture removal with PCP in 2 weeks  Discharge Diagnoses: Principal Problem:   Deep laceration of knee Active Problems:   History of pulmonary embolus (PE)   Essential hypertension   PAF (paroxysmal atrial fibrillation) (HCC)   Stage 3b chronic kidney disease (CKD) (HCC)   Iron deficiency anemia   Pacemaker   Polymyalgia rheumatica (HCC)   Atypical atrial flutter (HCC)   Bradycardia   Chronic diastolic CHF (congestive heart failure) (HCC)   Anemia   Cellulitis  Resolved Problems:   * No resolved hospital problems. *  Hospital Course: 89 year old with history of CKD stage IIIb, chronic A-fib, pacemaker in place, chronic anemia, presented from home with mechanical fall, she was trying to get out of her bed and fell on both her knees. Significant both knees were injured. Sutured in the ER, admitted for observation due to significant injury and disposition planning.   Assessment and Plan: Mechanical injury Right infrapatellar skin abrasion Left prepatellar skin tear status post sutures Left infrapatellar skin avulsion.   Currently stable.  Sutured in the ER.  Continued with nonstick dressing and compression bandage.  Pt was prescribed Keflex 500 mg twice daily for 6 days. Patient will need dressing changes at home, family can do it.  Will need suture removal, can follow-up at PCP in 2 weeks.   Acute blood loss anemia:  -Likely secondary to bleeding from wounds to knees. Now s/p sutures in ED with only scant blood at this time -Pt s/p dose of IV iron on 3/4 -Eliquis was held this admit -Discussed with Orthopedic  Surgery. OK to resume eliquis on d/c   Chronic A-fib, a flutter, sick sinus syndrome status post pacemaker:  -Patient is fairly stable.  She is on amiodarone and carvedilol.  In sinus rhythm now. -Eliquis was on hold initially -Discussed with Orthopedic Surgery, OK to resume eliquis   Chronic diastolic heart failure:  -Cont on home dose of carvedilol and Lasix.   PMR: On chronic prednisone therapy.   History of PE: Holding Eliquis per 48 hours. Pulmonary embolism was more than 2 years ago.    Consultants: Discussed case with Orthopedic Surgery on call Procedures performed:   Disposition: Home Diet recommendation:  Discharge Diet Orders (From admission, onward)     Start     Ordered   03/10/23 0000  Diet - low sodium heart healthy        03/10/23 1142           Regular diet DISCHARGE MEDICATION: Allergies as of 03/11/2023       Reactions   Codeine Itching   Penicillins Itching, Rash   Apresoline [hydralazine] Other (See Comments)   Felt bad Could hear/feel pulse in head    Cardura [doxazosin] Other (See Comments)   Congestion Wheezing Heavy feeling in chest   Vibramycin [doxycycline] Other (See Comments)   Chest tightness         Medication List     PAUSE taking these medications    Eliquis 2.5 MG Tabs tablet Wait to take this until: March 12, 2023 Generic drug: apixaban Take 1 tablet  by mouth twice daily       TAKE these medications    acetaminophen 325 MG tablet Commonly known as: TYLENOL Take 2 tablets (650 mg total) by mouth every 6 (six) hours as needed for mild pain (pain score 1-3) (or Fever >/= 101).   amiodarone 200 MG tablet Commonly known as: PACERONE Take 1/2 (one-half) tablet by mouth once daily   carvedilol 25 MG tablet Commonly known as: COREG TAKE 1 TABLET BY MOUTH TWICE DAILY WITH A MEAL   cephALEXin 500 MG capsule Commonly known as: KEFLEX Take 1 capsule (500 mg total) by mouth every 12 (twelve) hours for 7 days.    diazepam 2 MG tablet Commonly known as: VALIUM Take 2 mg by mouth at bedtime.   furosemide 20 MG tablet Commonly known as: LASIX Take 1 tablet (20 mg total) by mouth daily. Can take an additional 20 mg tablet once a day as needed What changed:  when to take this additional instructions   minoxidil 2.5 MG tablet Commonly known as: LONITEN Take 2.5 mg by mouth daily at 12 noon.   pantoprazole 40 MG tablet Commonly known as: Protonix Take 1 tablet (40 mg total) by mouth daily.   PreserVision AREDS 2 Caps Take 1 capsule by mouth daily.   traMADol 50 MG tablet Commonly known as: ULTRAM Take 0.5 tablets (25 mg total) by mouth every 6 (six) hours as needed for up to 5 days for moderate pain (pain score 4-6) or severe pain (pain score 7-10).   valsartan 160 MG tablet Commonly known as: DIOVAN Take 160 mg by mouth See admin instructions. Take 1 tablet (160mg ) by mouth in the evening as needed for sBP > 160.               Durable Medical Equipment  (From admission, onward)           Start     Ordered   03/11/23 1329  For home use only DME oxygen  Once       Question Answer Comment  Length of Need 6 Months   Mode or (Route) Nasal cannula   Liters per Minute 1   Frequency Continuous (stationary and portable oxygen unit needed)   Oxygen delivery system Gas      03/11/23 1328   03/11/23 1040  For home use only DME Bedside commode  Once       Question:  Patient needs a bedside commode to treat with the following condition  Answer:  Weakness   03/11/23 1039              Discharge Care Instructions  (From admission, onward)           Start     Ordered   03/10/23 0000  Discharge wound care:       Comments: Bilateral knee: Apply non adherent dressing (petrolatum) on the skin tear and non adherent pad on the suture. Reinforce with padding , apply ACE wrap to keep dressing in place .   03/10/23 1142            Follow-up Information     Daisy Floro, MD Follow up in 2 week(s).   Specialty: Family Medicine Why: Hospital follow up Contact information: [address] [place] [address] [phone]         Ollen Gross, MD Follow up.   Specialty: Orthopedic Surgery Why: As needed Contact information: [address] [place] [address] [phone]  Follow up with outpatient wound care. Schedule an appointment as soon as possible for a visit.   Why: Hospital follow up               Discharge Exam: Filed Weights   03/09/23 0945  Weight: 56.7 kg   General exam: Awake, laying in bed, in nad Respiratory system: Normal respiratory effort, no wheezing Cardiovascular system: regular rate, s1, s2 Gastrointestinal system: Soft, nondistended, positive BS Central nervous system: CN2-12 grossly intact, strength intact Extremities: Perfused, no clubbing Skin: Normal skin turgor, no notable skin lesions seen Psychiatry: Mood normal // no visual hallucinations   Condition at discharge: fair  The results of significant diagnostics from this hospitalization (including imaging, microbiology, ancillary and laboratory) are listed below for reference.   Imaging Studies: DG Pelvis 1-2 Views Result Date: 03/09/2023 CLINICAL DATA:  Pain. EXAM: PELVIS - 1-2 VIEW COMPARISON:  CT abdomen/pelvis dated 03/25/2021. FINDINGS: Diffuse osseous demineralization. Evaluation of the sacrum is limited due to overlying bowel-gas. No acute fracture or diastasis identified. Mild-to-moderate degenerative changes of the bilateral hips. IMPRESSION: 1. No acute osseous abnormality identified. 2. Mild-to-moderate degenerative changes of the bilateral hips. Electronically Signed   By: Hart Robinsons M.D.   On: 03/09/2023 16:27   DG Chest 2 View Result Date: 03/09/2023 CLINICAL DATA:  Pain. EXAM: CHEST - 2 VIEW COMPARISON:  Chest radiograph dated 10/08/2022. FINDINGS: Stable cardiomegaly. Stable left chest wall  pacemaker in place. Bilateral central perihilar interstitial prominence. Blunting of the left-greater-than-right costophrenic angles may reflect small bilateral pleural effusions. Mild bibasilar atelectasis. Retrocardiac opacities. No pneumothorax. Diffuse osseous demineralization. No acute osseous abnormality identified. IMPRESSION: 1. Cardiomegaly with findings suggestive of central pulmonary vascular congestion. 2. Small bilateral pleural effusions with bibasilar atelectasis. 3. Retrocardiac opacities could reflect atelectasis or infiltrate. Electronically Signed   By: Hart Robinsons M.D.   On: 03/09/2023 16:25   DG Knee Complete 4 Views Left Result Date: 03/09/2023 CLINICAL DATA:  Pain. EXAM: LEFT KNEE - COMPLETE 4+ VIEW COMPARISON:  None Available. FINDINGS: Diffuse osseous demineralization. No acute fracture, dislocation, or significant joint effusion. Mild-to-moderate degenerative changes of the knee with joint space narrowing and marginal osteophytosis most pronounced in the medial femorotibial compartment. Prepatellar soft tissue swelling. IMPRESSION: 1. No acute osseous abnormality. 2. Mild-to-moderate degenerative changes of the knee. Electronically Signed   By: Hart Robinsons M.D.   On: 03/09/2023 16:17   DG Knee Complete 4 Views Right Result Date: 03/09/2023 CLINICAL DATA:  Knee pain EXAM: RIGHT KNEE - COMPLETE 4 VIEW COMPARISON:  None Available. FINDINGS: Postsurgical changes of right knee arthroplasty. Hardware appears intact and well seated. No evidence of acute displaced fracture, dislocation, or joint effusion. Diffuse osteopenia. Soft tissues are unremarkable. IMPRESSION: Postsurgical changes of right knee arthroplasty without evidence of hardware complication. Electronically Signed   By: Agustin Cree M.D.   On: 03/09/2023 16:13    Microbiology: Results for orders placed or performed during the hospital encounter of 03/09/23  Culture, blood (routine x 2)     Status: None (Preliminary  result)   Collection Time: 03/09/23  9:04 PM   Specimen: BLOOD RIGHT ARM  Result Value Ref Range Status   Specimen Description BLOOD RIGHT ARM  Final   Special Requests   Final    BOTTLES DRAWN AEROBIC ONLY Blood Culture results may not be optimal due to an inadequate volume of blood received in culture bottles   Culture   Final    NO GROWTH 2 DAYS Performed at [facility]  [facility], [address], [place], [place] [address]    Report Status PENDING  Incomplete  Culture, blood (routine x 2)     Status: None (Preliminary result)   Collection Time: 03/09/23  9:06 PM   Specimen: BLOOD RIGHT ARM  Result Value Ref Range Status   Specimen Description BLOOD RIGHT ARM  Final   Special Requests   Final    BOTTLES DRAWN AEROBIC ONLY Blood Culture results may not be optimal due to an inadequate volume of blood received in culture bottles   Culture   Final    NO GROWTH 2 DAYS Performed at [facility], [address] [place], [place] [address]    Report Status PENDING  Incomplete  MRSA Next Gen by PCR, Nasal     Status: Abnormal   Collection Time: 03/10/23 10:26 AM   Specimen: Nasal Mucosa; Nasal Swab  Result Value Ref Range Status   MRSA by PCR Next Gen DETECTED (A) NOT DETECTED Final    Comment: RESULT CALLED TO, READ BACK BY AND VERIFIED WITH: GINA DAVANNA 3/4@1217  MSG (NOTE) The GeneXpert MRSA Assay (FDA approved for NASAL specimens only), is one component of a comprehensive MRSA colonization surveillance program. It is not intended to diagnose MRSA infection nor to guide or monitor treatment for MRSA infections. Test performance is not FDA approved in patients less than 34 years old. Performed at [facility], [address], [place], [place] [address]     Labs: CBC: Recent Labs  Lab 03/09/23 1030 03/09/23 1410 03/09/23 2104 03/10/23 0732  WBC 6.4  --  7.7 6.9  NEUTROABS 4.5  --   --   --   HGB 10.0* 9.6* 9.2* 8.3*  HCT 32.9* 31.1* 29.7* 27.3*  MCV 96.2  --   95.2 97.2  PLT 265  --  276 247   Basic Metabolic Panel: Recent Labs  Lab 03/09/23 1030 03/10/23 0732  NA 142 141  K 4.5 4.6  CL 106 109  CO2 22 24  GLUCOSE 111* 92  BUN 38* 30*  CREATININE 2.02* 1.77*  CALCIUM 8.2* 7.8*   Liver Function Tests: Recent Labs  Lab 03/09/23 1030 03/10/23 0732  AST 24 16  ALT 22 18  ALKPHOS 83 65  BILITOT 0.7 0.6  PROT 6.1* 5.3*  ALBUMIN 2.7* 2.4*   CBG: No results for input(s): "GLUCAP" in the last 168 hours.  Discharge time spent: less than 30 minutes.  Signed: Rickey Barbara, MD [facility] 03/11/2023

## 2023-03-11 NOTE — Progress Notes (Addendum)
 SATURATION QUALIFICATIONS: (This note is used to comply with regulatory documentation for home oxygen)  Patient Saturations on Room Air at Rest = 86%  Patient Saturations on 1 Liters of oxygen while Ambulating = 94%  Please briefly explain why patient needs home oxygen: Patient's O2 saturation continues to trend below the required threshold for safe discharge to home without oxygen.

## 2023-03-11 NOTE — Progress Notes (Signed)
 Mobility Specialist Progress Note:   03/11/23 1124  Mobility  Activity Ambulated with assistance in room  Level of Assistance Standby assist, set-up cues, supervision of patient - no hands on  Assistive Device Front wheel walker  Distance Ambulated (ft) 50 ft  Activity Response Tolerated well  Mobility Referral Yes  Mobility visit 1 Mobility  Mobility Specialist Start Time (ACUTE ONLY) 0940  Mobility Specialist Stop Time (ACUTE ONLY) 1000  Mobility Specialist Time Calculation (min) (ACUTE ONLY) 20 min   Pre Mobility: 89% SpO2 1 L During Mobility:  85%- 91% SpO2 1 - 2 L  Pt received in bed, agreeable to mobility. Pt was able to stand and ambulate with RW without physical assistance. No unsteadiness or LOB present during session. Pt desat to 85% on 1 L. Pursed lip breathing encouraged. O2 increased to 2 L to keep sats above 90%. Pt c/o slight fatigue, otherwise asx throughout. Pt left in chair with call bell in reach and all needs met. Chair alarm on.    Leory Plowman  Mobility Specialist Please contact via SecureChat Rehab office at [phone]

## 2023-03-11 NOTE — Hospital Course (Signed)
 89 year old with history of CKD stage IIIb, chronic A-fib, pacemaker in place, chronic anemia, presented from home with mechanical fall, she was trying to get out of her bed and fell on both her knees. Significant both knees were injured. Sutured in the ER, admitted for observation due to significant injury and disposition planning.

## 2023-03-11 NOTE — TOC CM/SW Note (Signed)
 Patient confined to a room with no bathroom therefore needs a bedside commode

## 2023-03-11 NOTE — TOC Progression Note (Addendum)
 Transition of Care (TOC) - Progression Note   Grand daughter asking for another bedside commode and concerned patient may need home oxygen. NCM secure chatted team and asked for ambulation oxygen saturation note   1300 Have messaged MD to co sign ambulation saturation note and bedside commode note. Also, asked for oxygen order.  Spoke to patient and grand daughter at bedside. Will order BSC and oxygen , once notes are signed and orders.   Ordered oxygen with Earna Coder with [employer] .   Cory with [facility] aware discharge today   Patient told [employer] she already had a bedside commode so she did not want another . Messaged Kristie  Patient Details  Name: Allison Norman MRN: [id number] Date of Birth: 1924/07/18  Transition of Care [facility]) CM/SW Contact  Saagar Tortorella, Adria Devon, RN Phone Number: 03/11/2023, 10:28 AM  Clinical Narrative:       Expected Discharge Plan:  (await PT recommendations) Barriers to Discharge: Continued Medical Work up  Expected Discharge Plan and Services   Discharge Planning Services: CM Consult Post Acute Care Choice: NA Living arrangements for the past 2 months: Single Family Home                 DME Arranged:  (await PT recommendations) DME Agency:  (await PT recommendations)       HH Arranged:  (await PT recommendations)           Social Determinants of Health (SDOH) Interventions SDOH Screenings   Food Insecurity: No Food Insecurity (03/09/2023)  Housing: Low Risk  (03/09/2023)  Transportation Needs: No Transportation Needs (03/09/2023)  Utilities: Not At Risk (03/09/2023)  Social Connections: Socially Isolated (03/09/2023)  Tobacco Use: Medium Risk (03/09/2023)    Readmission Risk Interventions     No data to display

## 2023-03-11 NOTE — Plan of Care (Signed)

## 2023-03-14 LAB — CULTURE, BLOOD (ROUTINE X 2)
Culture: NO GROWTH
Culture: NO GROWTH

## 2023-03-15 DIAGNOSIS — I5032 Chronic diastolic (congestive) heart failure: Secondary | ICD-10-CM | POA: Diagnosis not present

## 2023-03-15 DIAGNOSIS — J9811 Atelectasis: Secondary | ICD-10-CM | POA: Diagnosis not present

## 2023-03-15 DIAGNOSIS — I130 Hypertensive heart and chronic kidney disease with heart failure and stage 1 through stage 4 chronic kidney disease, or unspecified chronic kidney disease: Secondary | ICD-10-CM | POA: Diagnosis not present

## 2023-03-15 DIAGNOSIS — M353 Polymyalgia rheumatica: Secondary | ICD-10-CM | POA: Diagnosis not present

## 2023-03-15 DIAGNOSIS — Z7901 Long term (current) use of anticoagulants: Secondary | ICD-10-CM | POA: Diagnosis not present

## 2023-03-15 DIAGNOSIS — H3530 Unspecified macular degeneration: Secondary | ICD-10-CM | POA: Diagnosis not present

## 2023-03-15 DIAGNOSIS — I484 Atypical atrial flutter: Secondary | ICD-10-CM | POA: Diagnosis not present

## 2023-03-15 DIAGNOSIS — I480 Paroxysmal atrial fibrillation: Secondary | ICD-10-CM | POA: Diagnosis not present

## 2023-03-15 DIAGNOSIS — D62 Acute posthemorrhagic anemia: Secondary | ICD-10-CM | POA: Diagnosis not present

## 2023-03-15 DIAGNOSIS — M1712 Unilateral primary osteoarthritis, left knee: Secondary | ICD-10-CM | POA: Diagnosis not present

## 2023-03-15 DIAGNOSIS — I495 Sick sinus syndrome: Secondary | ICD-10-CM | POA: Diagnosis not present

## 2023-03-15 DIAGNOSIS — M316 Other giant cell arteritis: Secondary | ICD-10-CM | POA: Diagnosis not present

## 2023-03-15 DIAGNOSIS — S81012D Laceration without foreign body, left knee, subsequent encounter: Secondary | ICD-10-CM | POA: Diagnosis not present

## 2023-03-15 DIAGNOSIS — M160 Bilateral primary osteoarthritis of hip: Secondary | ICD-10-CM | POA: Diagnosis not present

## 2023-03-15 DIAGNOSIS — I350 Nonrheumatic aortic (valve) stenosis: Secondary | ICD-10-CM | POA: Diagnosis not present

## 2023-03-15 DIAGNOSIS — I272 Pulmonary hypertension, unspecified: Secondary | ICD-10-CM | POA: Diagnosis not present

## 2023-03-15 DIAGNOSIS — D509 Iron deficiency anemia, unspecified: Secondary | ICD-10-CM | POA: Diagnosis not present

## 2023-03-15 DIAGNOSIS — I1A0 Resistant hypertension: Secondary | ICD-10-CM | POA: Diagnosis not present

## 2023-03-15 DIAGNOSIS — H00025 Hordeolum internum left lower eyelid: Secondary | ICD-10-CM | POA: Diagnosis not present

## 2023-03-15 DIAGNOSIS — M810 Age-related osteoporosis without current pathological fracture: Secondary | ICD-10-CM | POA: Diagnosis not present

## 2023-03-15 DIAGNOSIS — D631 Anemia in chronic kidney disease: Secondary | ICD-10-CM | POA: Diagnosis not present

## 2023-03-15 DIAGNOSIS — Z9981 Dependence on supplemental oxygen: Secondary | ICD-10-CM | POA: Diagnosis not present

## 2023-03-15 DIAGNOSIS — S81011D Laceration without foreign body, right knee, subsequent encounter: Secondary | ICD-10-CM | POA: Diagnosis not present

## 2023-03-15 DIAGNOSIS — Z86711 Personal history of pulmonary embolism: Secondary | ICD-10-CM | POA: Diagnosis not present

## 2023-03-15 DIAGNOSIS — N1832 Chronic kidney disease, stage 3b: Secondary | ICD-10-CM | POA: Diagnosis not present

## 2023-03-17 DIAGNOSIS — I130 Hypertensive heart and chronic kidney disease with heart failure and stage 1 through stage 4 chronic kidney disease, or unspecified chronic kidney disease: Secondary | ICD-10-CM | POA: Diagnosis not present

## 2023-03-17 DIAGNOSIS — M160 Bilateral primary osteoarthritis of hip: Secondary | ICD-10-CM | POA: Diagnosis not present

## 2023-03-17 DIAGNOSIS — S81011D Laceration without foreign body, right knee, subsequent encounter: Secondary | ICD-10-CM | POA: Diagnosis not present

## 2023-03-17 DIAGNOSIS — D62 Acute posthemorrhagic anemia: Secondary | ICD-10-CM | POA: Diagnosis not present

## 2023-03-17 DIAGNOSIS — S81012D Laceration without foreign body, left knee, subsequent encounter: Secondary | ICD-10-CM | POA: Diagnosis not present

## 2023-03-17 DIAGNOSIS — M1712 Unilateral primary osteoarthritis, left knee: Secondary | ICD-10-CM | POA: Diagnosis not present

## 2023-03-19 DIAGNOSIS — M160 Bilateral primary osteoarthritis of hip: Secondary | ICD-10-CM | POA: Diagnosis not present

## 2023-03-19 DIAGNOSIS — M1712 Unilateral primary osteoarthritis, left knee: Secondary | ICD-10-CM | POA: Diagnosis not present

## 2023-03-19 DIAGNOSIS — S81012D Laceration without foreign body, left knee, subsequent encounter: Secondary | ICD-10-CM | POA: Diagnosis not present

## 2023-03-19 DIAGNOSIS — D62 Acute posthemorrhagic anemia: Secondary | ICD-10-CM | POA: Diagnosis not present

## 2023-03-19 DIAGNOSIS — S81011D Laceration without foreign body, right knee, subsequent encounter: Secondary | ICD-10-CM | POA: Diagnosis not present

## 2023-03-19 DIAGNOSIS — I130 Hypertensive heart and chronic kidney disease with heart failure and stage 1 through stage 4 chronic kidney disease, or unspecified chronic kidney disease: Secondary | ICD-10-CM | POA: Diagnosis not present

## 2023-03-23 ENCOUNTER — Other Ambulatory Visit (HOSPITAL_BASED_OUTPATIENT_CLINIC_OR_DEPARTMENT_OTHER): Payer: Self-pay | Admitting: Cardiovascular Disease

## 2023-03-23 DIAGNOSIS — S81819A Laceration without foreign body, unspecified lower leg, initial encounter: Secondary | ICD-10-CM | POA: Diagnosis not present

## 2023-03-23 DIAGNOSIS — Z6821 Body mass index (BMI) 21.0-21.9, adult: Secondary | ICD-10-CM | POA: Diagnosis not present

## 2023-03-23 DIAGNOSIS — Z09 Encounter for follow-up examination after completed treatment for conditions other than malignant neoplasm: Secondary | ICD-10-CM | POA: Diagnosis not present

## 2023-03-23 DIAGNOSIS — S81012D Laceration without foreign body, left knee, subsequent encounter: Secondary | ICD-10-CM | POA: Diagnosis not present

## 2023-03-24 ENCOUNTER — Telehealth (HOSPITAL_BASED_OUTPATIENT_CLINIC_OR_DEPARTMENT_OTHER): Payer: Self-pay | Admitting: Cardiovascular Disease

## 2023-03-24 DIAGNOSIS — S81012D Laceration without foreign body, left knee, subsequent encounter: Secondary | ICD-10-CM | POA: Diagnosis not present

## 2023-03-24 DIAGNOSIS — I130 Hypertensive heart and chronic kidney disease with heart failure and stage 1 through stage 4 chronic kidney disease, or unspecified chronic kidney disease: Secondary | ICD-10-CM | POA: Diagnosis not present

## 2023-03-24 DIAGNOSIS — S81011D Laceration without foreign body, right knee, subsequent encounter: Secondary | ICD-10-CM | POA: Diagnosis not present

## 2023-03-24 DIAGNOSIS — M1712 Unilateral primary osteoarthritis, left knee: Secondary | ICD-10-CM | POA: Diagnosis not present

## 2023-03-24 DIAGNOSIS — D62 Acute posthemorrhagic anemia: Secondary | ICD-10-CM | POA: Diagnosis not present

## 2023-03-24 DIAGNOSIS — M160 Bilateral primary osteoarthritis of hip: Secondary | ICD-10-CM | POA: Diagnosis not present

## 2023-03-24 NOTE — Telephone Encounter (Signed)
 Beth an Charity fundraiser with [employer] is calling stating the patient's BP today read 92/46 when she first arrived at the home. She rechecked at the end of visit, and BP was 98/49. Patient was asymptomatic. She reports pt had taken minoxidil 1 hr before she came. Due to this she advised the patient to begin taking her BP 1 hr/an hr and a half after taking BP meds for her to callback and report to the office next week.   Per RN requesting message be sent as an FYI for the office.

## 2023-03-31 DIAGNOSIS — D62 Acute posthemorrhagic anemia: Secondary | ICD-10-CM | POA: Diagnosis not present

## 2023-03-31 DIAGNOSIS — M1712 Unilateral primary osteoarthritis, left knee: Secondary | ICD-10-CM | POA: Diagnosis not present

## 2023-03-31 DIAGNOSIS — S81012D Laceration without foreign body, left knee, subsequent encounter: Secondary | ICD-10-CM | POA: Diagnosis not present

## 2023-03-31 DIAGNOSIS — S81011D Laceration without foreign body, right knee, subsequent encounter: Secondary | ICD-10-CM | POA: Diagnosis not present

## 2023-03-31 DIAGNOSIS — M160 Bilateral primary osteoarthritis of hip: Secondary | ICD-10-CM | POA: Diagnosis not present

## 2023-03-31 DIAGNOSIS — I130 Hypertensive heart and chronic kidney disease with heart failure and stage 1 through stage 4 chronic kidney disease, or unspecified chronic kidney disease: Secondary | ICD-10-CM | POA: Diagnosis not present

## 2023-03-31 NOTE — Telephone Encounter (Signed)
 Message received, reminder set to check on pt BP in one week

## 2023-04-02 DIAGNOSIS — Z6821 Body mass index (BMI) 21.0-21.9, adult: Secondary | ICD-10-CM | POA: Diagnosis not present

## 2023-04-02 DIAGNOSIS — Z09 Encounter for follow-up examination after completed treatment for conditions other than malignant neoplasm: Secondary | ICD-10-CM | POA: Diagnosis not present

## 2023-04-02 DIAGNOSIS — S81812D Laceration without foreign body, left lower leg, subsequent encounter: Secondary | ICD-10-CM | POA: Diagnosis not present

## 2023-04-06 ENCOUNTER — Encounter (HOSPITAL_BASED_OUTPATIENT_CLINIC_OR_DEPARTMENT_OTHER): Payer: Self-pay

## 2023-04-07 DIAGNOSIS — H35351 Cystoid macular degeneration, right eye: Secondary | ICD-10-CM | POA: Diagnosis not present

## 2023-04-07 DIAGNOSIS — H353124 Nonexudative age-related macular degeneration, left eye, advanced atrophic with subfoveal involvement: Secondary | ICD-10-CM | POA: Diagnosis not present

## 2023-04-07 DIAGNOSIS — S81011D Laceration without foreign body, right knee, subsequent encounter: Secondary | ICD-10-CM | POA: Diagnosis not present

## 2023-04-07 DIAGNOSIS — S81012D Laceration without foreign body, left knee, subsequent encounter: Secondary | ICD-10-CM | POA: Diagnosis not present

## 2023-04-07 DIAGNOSIS — H02833 Dermatochalasis of right eye, unspecified eyelid: Secondary | ICD-10-CM | POA: Diagnosis not present

## 2023-04-07 DIAGNOSIS — M1712 Unilateral primary osteoarthritis, left knee: Secondary | ICD-10-CM | POA: Diagnosis not present

## 2023-04-07 DIAGNOSIS — D62 Acute posthemorrhagic anemia: Secondary | ICD-10-CM | POA: Diagnosis not present

## 2023-04-07 DIAGNOSIS — H353211 Exudative age-related macular degeneration, right eye, with active choroidal neovascularization: Secondary | ICD-10-CM | POA: Diagnosis not present

## 2023-04-07 DIAGNOSIS — M160 Bilateral primary osteoarthritis of hip: Secondary | ICD-10-CM | POA: Diagnosis not present

## 2023-04-07 DIAGNOSIS — H02836 Dermatochalasis of left eye, unspecified eyelid: Secondary | ICD-10-CM | POA: Diagnosis not present

## 2023-04-07 DIAGNOSIS — I130 Hypertensive heart and chronic kidney disease with heart failure and stage 1 through stage 4 chronic kidney disease, or unspecified chronic kidney disease: Secondary | ICD-10-CM | POA: Diagnosis not present

## 2023-04-07 DIAGNOSIS — H353113 Nonexudative age-related macular degeneration, right eye, advanced atrophic without subfoveal involvement: Secondary | ICD-10-CM | POA: Diagnosis not present

## 2023-04-14 DIAGNOSIS — M160 Bilateral primary osteoarthritis of hip: Secondary | ICD-10-CM | POA: Diagnosis not present

## 2023-04-14 DIAGNOSIS — D649 Anemia, unspecified: Secondary | ICD-10-CM | POA: Diagnosis not present

## 2023-04-14 DIAGNOSIS — I350 Nonrheumatic aortic (valve) stenosis: Secondary | ICD-10-CM | POA: Diagnosis not present

## 2023-04-14 DIAGNOSIS — E781 Pure hyperglyceridemia: Secondary | ICD-10-CM | POA: Diagnosis not present

## 2023-04-14 DIAGNOSIS — H00025 Hordeolum internum left lower eyelid: Secondary | ICD-10-CM | POA: Diagnosis not present

## 2023-04-14 DIAGNOSIS — R062 Wheezing: Secondary | ICD-10-CM | POA: Diagnosis not present

## 2023-04-14 DIAGNOSIS — S81012D Laceration without foreign body, left knee, subsequent encounter: Secondary | ICD-10-CM | POA: Diagnosis not present

## 2023-04-14 DIAGNOSIS — F411 Generalized anxiety disorder: Secondary | ICD-10-CM | POA: Diagnosis not present

## 2023-04-14 DIAGNOSIS — D62 Acute posthemorrhagic anemia: Secondary | ICD-10-CM | POA: Diagnosis not present

## 2023-04-14 DIAGNOSIS — Z86711 Personal history of pulmonary embolism: Secondary | ICD-10-CM | POA: Diagnosis not present

## 2023-04-14 DIAGNOSIS — J309 Allergic rhinitis, unspecified: Secondary | ICD-10-CM | POA: Diagnosis not present

## 2023-04-14 DIAGNOSIS — H3530 Unspecified macular degeneration: Secondary | ICD-10-CM | POA: Diagnosis not present

## 2023-04-14 DIAGNOSIS — M1712 Unilateral primary osteoarthritis, left knee: Secondary | ICD-10-CM | POA: Diagnosis not present

## 2023-04-14 DIAGNOSIS — Z7901 Long term (current) use of anticoagulants: Secondary | ICD-10-CM | POA: Diagnosis not present

## 2023-04-14 DIAGNOSIS — Z9981 Dependence on supplemental oxygen: Secondary | ICD-10-CM | POA: Diagnosis not present

## 2023-04-14 DIAGNOSIS — S81011D Laceration without foreign body, right knee, subsequent encounter: Secondary | ICD-10-CM | POA: Diagnosis not present

## 2023-04-14 DIAGNOSIS — M810 Age-related osteoporosis without current pathological fracture: Secondary | ICD-10-CM | POA: Diagnosis not present

## 2023-04-14 DIAGNOSIS — I480 Paroxysmal atrial fibrillation: Secondary | ICD-10-CM | POA: Diagnosis not present

## 2023-04-14 DIAGNOSIS — I495 Sick sinus syndrome: Secondary | ICD-10-CM | POA: Diagnosis not present

## 2023-04-14 DIAGNOSIS — D631 Anemia in chronic kidney disease: Secondary | ICD-10-CM | POA: Diagnosis not present

## 2023-04-14 DIAGNOSIS — I130 Hypertensive heart and chronic kidney disease with heart failure and stage 1 through stage 4 chronic kidney disease, or unspecified chronic kidney disease: Secondary | ICD-10-CM | POA: Diagnosis not present

## 2023-04-14 DIAGNOSIS — D509 Iron deficiency anemia, unspecified: Secondary | ICD-10-CM | POA: Diagnosis not present

## 2023-04-14 DIAGNOSIS — I272 Pulmonary hypertension, unspecified: Secondary | ICD-10-CM | POA: Diagnosis not present

## 2023-04-14 DIAGNOSIS — I5032 Chronic diastolic (congestive) heart failure: Secondary | ICD-10-CM | POA: Diagnosis not present

## 2023-04-14 DIAGNOSIS — N184 Chronic kidney disease, stage 4 (severe): Secondary | ICD-10-CM | POA: Diagnosis not present

## 2023-04-14 DIAGNOSIS — J9811 Atelectasis: Secondary | ICD-10-CM | POA: Diagnosis not present

## 2023-04-14 DIAGNOSIS — M316 Other giant cell arteritis: Secondary | ICD-10-CM | POA: Diagnosis not present

## 2023-04-14 DIAGNOSIS — M353 Polymyalgia rheumatica: Secondary | ICD-10-CM | POA: Diagnosis not present

## 2023-04-14 DIAGNOSIS — I484 Atypical atrial flutter: Secondary | ICD-10-CM | POA: Diagnosis not present

## 2023-04-14 DIAGNOSIS — I1A0 Resistant hypertension: Secondary | ICD-10-CM | POA: Diagnosis not present

## 2023-04-14 DIAGNOSIS — N1832 Chronic kidney disease, stage 3b: Secondary | ICD-10-CM | POA: Diagnosis not present

## 2023-04-15 ENCOUNTER — Telehealth: Payer: Self-pay | Admitting: Cardiovascular Disease

## 2023-04-15 DIAGNOSIS — S81012D Laceration without foreign body, left knee, subsequent encounter: Secondary | ICD-10-CM | POA: Diagnosis not present

## 2023-04-15 DIAGNOSIS — M1712 Unilateral primary osteoarthritis, left knee: Secondary | ICD-10-CM | POA: Diagnosis not present

## 2023-04-15 DIAGNOSIS — M160 Bilateral primary osteoarthritis of hip: Secondary | ICD-10-CM | POA: Diagnosis not present

## 2023-04-15 DIAGNOSIS — D62 Acute posthemorrhagic anemia: Secondary | ICD-10-CM | POA: Diagnosis not present

## 2023-04-15 DIAGNOSIS — I130 Hypertensive heart and chronic kidney disease with heart failure and stage 1 through stage 4 chronic kidney disease, or unspecified chronic kidney disease: Secondary | ICD-10-CM | POA: Diagnosis not present

## 2023-04-15 DIAGNOSIS — S81011D Laceration without foreign body, right knee, subsequent encounter: Secondary | ICD-10-CM | POA: Diagnosis not present

## 2023-04-15 MED ORDER — MINOXIDIL 2.5 MG PO TABS: ORAL_TABLET | ORAL | Status: DC

## 2023-04-15 NOTE — Telephone Encounter (Signed)
 Returned call to [place],   Update prescription to take as needed at noon for SBP > 150.   No answer, left detailed message on secure line with the above instructions. Rx updated

## 2023-04-15 NOTE — Telephone Encounter (Signed)
 Returned call to [employer],   [employer], she has minoxidil 2.5mg  to take daily at noon. She states pt has just been taking the medication as needed. RN had her check BP before mid day dose for 1 week.   BP readings at noon each day  112/55 110/56 126/60 128/64 141/81  106/54 117/64 112/54  She states that she hasn't been taking the midday medication. Beth wanted to know should she use it for PRN with specific parameters or discontinue the medication all together

## 2023-04-15 NOTE — Telephone Encounter (Signed)
 Pt c/o medication issue:  1. Name of Medication:   minoxidil (LONITEN) 2.5 MG tablet    2. How are you currently taking this medication (dosage and times per day)?  Take 2.5 mg by mouth daily at 12 noon.     3. Are you having a reaction (difficulty breathing--STAT)? No  4. What is your medication issue? Waynetta Sandy is requesting a callback regarding her needing clarity on when pt should take medication and if she should continue to take it.    Waynetta Sandy is requesting a callback regarding her wanting to provide BP readings to nurse. Please advise

## 2023-04-17 ENCOUNTER — Ambulatory Visit (INDEPENDENT_AMBULATORY_CARE_PROVIDER_SITE_OTHER): Payer: Medicare Other

## 2023-04-17 DIAGNOSIS — I495 Sick sinus syndrome: Secondary | ICD-10-CM

## 2023-04-18 LAB — CUP PACEART REMOTE DEVICE CHECK
Battery Remaining Longevity: 66 mo
Battery Remaining Percentage: 100 %
Brady Statistic RA Percent Paced: 84 %
Brady Statistic RV Percent Paced: 6 %
Date Time Interrogation Session: 20250411024600
Implantable Lead Connection Status: 753985
Implantable Lead Connection Status: 753985
Implantable Lead Implant Date: 20210716
Implantable Lead Implant Date: 20210716
Implantable Lead Location: 753859
Implantable Lead Location: 753860
Implantable Lead Model: 7840
Implantable Lead Model: 7841
Implantable Lead Serial Number: 1017229
Implantable Lead Serial Number: 1090224
Implantable Pulse Generator Implant Date: 20210716
Lead Channel Impedance Value: 502 Ohm
Lead Channel Impedance Value: 562 Ohm
Lead Channel Pacing Threshold Amplitude: 0.5 V
Lead Channel Pacing Threshold Amplitude: 1.3 V
Lead Channel Pacing Threshold Pulse Width: 0.4 ms
Lead Channel Pacing Threshold Pulse Width: 0.4 ms
Lead Channel Setting Pacing Amplitude: 2 V
Lead Channel Setting Pacing Amplitude: 2.5 V
Lead Channel Setting Pacing Pulse Width: 0.4 ms
Lead Channel Setting Sensing Sensitivity: 2.5 mV
Pulse Gen Serial Number: 547553
Zone Setting Status: 755011

## 2023-04-21 ENCOUNTER — Encounter: Payer: Self-pay | Admitting: Internal Medicine

## 2023-04-21 DIAGNOSIS — H35351 Cystoid macular degeneration, right eye: Secondary | ICD-10-CM | POA: Diagnosis not present

## 2023-04-21 DIAGNOSIS — H353124 Nonexudative age-related macular degeneration, left eye, advanced atrophic with subfoveal involvement: Secondary | ICD-10-CM | POA: Diagnosis not present

## 2023-04-21 DIAGNOSIS — H02833 Dermatochalasis of right eye, unspecified eyelid: Secondary | ICD-10-CM | POA: Diagnosis not present

## 2023-04-21 DIAGNOSIS — H353113 Nonexudative age-related macular degeneration, right eye, advanced atrophic without subfoveal involvement: Secondary | ICD-10-CM | POA: Diagnosis not present

## 2023-04-21 DIAGNOSIS — H353211 Exudative age-related macular degeneration, right eye, with active choroidal neovascularization: Secondary | ICD-10-CM | POA: Diagnosis not present

## 2023-04-21 DIAGNOSIS — H02836 Dermatochalasis of left eye, unspecified eyelid: Secondary | ICD-10-CM | POA: Diagnosis not present

## 2023-04-22 ENCOUNTER — Ambulatory Visit: Admitting: Physician Assistant

## 2023-04-22 DIAGNOSIS — D62 Acute posthemorrhagic anemia: Secondary | ICD-10-CM | POA: Diagnosis not present

## 2023-04-22 DIAGNOSIS — I130 Hypertensive heart and chronic kidney disease with heart failure and stage 1 through stage 4 chronic kidney disease, or unspecified chronic kidney disease: Secondary | ICD-10-CM | POA: Diagnosis not present

## 2023-04-22 DIAGNOSIS — M1712 Unilateral primary osteoarthritis, left knee: Secondary | ICD-10-CM | POA: Diagnosis not present

## 2023-04-22 DIAGNOSIS — S81011D Laceration without foreign body, right knee, subsequent encounter: Secondary | ICD-10-CM | POA: Diagnosis not present

## 2023-04-22 DIAGNOSIS — M160 Bilateral primary osteoarthritis of hip: Secondary | ICD-10-CM | POA: Diagnosis not present

## 2023-04-22 DIAGNOSIS — S81012D Laceration without foreign body, left knee, subsequent encounter: Secondary | ICD-10-CM | POA: Diagnosis not present

## 2023-04-23 ENCOUNTER — Telehealth: Payer: Self-pay | Admitting: Cardiovascular Disease

## 2023-04-23 DIAGNOSIS — M160 Bilateral primary osteoarthritis of hip: Secondary | ICD-10-CM | POA: Diagnosis not present

## 2023-04-23 DIAGNOSIS — I130 Hypertensive heart and chronic kidney disease with heart failure and stage 1 through stage 4 chronic kidney disease, or unspecified chronic kidney disease: Secondary | ICD-10-CM | POA: Diagnosis not present

## 2023-04-23 DIAGNOSIS — M1712 Unilateral primary osteoarthritis, left knee: Secondary | ICD-10-CM | POA: Diagnosis not present

## 2023-04-23 DIAGNOSIS — S81012D Laceration without foreign body, left knee, subsequent encounter: Secondary | ICD-10-CM | POA: Diagnosis not present

## 2023-04-23 DIAGNOSIS — S81011D Laceration without foreign body, right knee, subsequent encounter: Secondary | ICD-10-CM | POA: Diagnosis not present

## 2023-04-23 DIAGNOSIS — D62 Acute posthemorrhagic anemia: Secondary | ICD-10-CM | POA: Diagnosis not present

## 2023-04-23 NOTE — Telephone Encounter (Signed)
 Pt c/o Shortness Of Breath: STAT if SOB developed within the last 24 hours or pt is noticeably SOB on the phone  1. Are you currently SOB (can you hear that pt is SOB on the phone)? No  2. How long have you been experiencing SOB? About a month   3. Are you SOB when sitting or when up moving around? Moving around  4. Are you currently experiencing any other symptoms? Oxygen dropped to 75% just walking to her bed and it took two minutes to get back to 90%. Please advise

## 2023-04-23 NOTE — Telephone Encounter (Signed)
 Called and spoke to pt of APP's recommendations. Pt is in agreement and verbalized understanding.

## 2023-04-23 NOTE — Telephone Encounter (Signed)
 If no increased edema, no persistent dyspnea low suspicion for volume overload. If she feels her volume status is increased from prior, we can schedule sooner OV. Her diastolic heart failure was noted to be well compensated in ED visit.   She may turn O2 up to 3L with ambulation. Agree with follow up with PCP team. If persistent low oxygen levels, may be best to follow up with pulmonology as well.   Allison Leoni S Jameah Rouser, NP

## 2023-04-23 NOTE — Telephone Encounter (Signed)
 Called and spoke to [facility] PT, Josiah Nigh. Josiah Nigh noticed that when patient climbed into bed yesterday, pt's O2 sats dropped to 75%. For the past month, pt has been on supplemental O2 since ED visit. Pt currently on 2L O2 via [place] continuously; sats were 90s. Pt does have hx CHF but does not check her weight. Josiah Nigh also sent message to PCP as well.   Called and spoke to pt. Pt only felt out of breath when her O2 decreased to 75%. Denies dizziness / lightheadedness. Denies increased sodium in diet. Denies checking daily weight. Bilateral ankles are swollen but pt states she elevates them and wears compression stockings. Denies any pain. Will send to APP for any further recommendations. Pt verbalized understanding.

## 2023-04-24 ENCOUNTER — Telehealth: Payer: Self-pay | Admitting: Cardiovascular Disease

## 2023-04-24 DIAGNOSIS — M160 Bilateral primary osteoarthritis of hip: Secondary | ICD-10-CM | POA: Diagnosis not present

## 2023-04-24 DIAGNOSIS — I130 Hypertensive heart and chronic kidney disease with heart failure and stage 1 through stage 4 chronic kidney disease, or unspecified chronic kidney disease: Secondary | ICD-10-CM | POA: Diagnosis not present

## 2023-04-24 DIAGNOSIS — M1712 Unilateral primary osteoarthritis, left knee: Secondary | ICD-10-CM | POA: Diagnosis not present

## 2023-04-24 DIAGNOSIS — S81011D Laceration without foreign body, right knee, subsequent encounter: Secondary | ICD-10-CM | POA: Diagnosis not present

## 2023-04-24 DIAGNOSIS — S81012D Laceration without foreign body, left knee, subsequent encounter: Secondary | ICD-10-CM | POA: Diagnosis not present

## 2023-04-24 DIAGNOSIS — D62 Acute posthemorrhagic anemia: Secondary | ICD-10-CM | POA: Diagnosis not present

## 2023-04-24 NOTE — Telephone Encounter (Signed)
 Gasper Karst said they are suppose to call in if pt is above 125 pd. Today she was 126.9 Please advise

## 2023-04-28 DIAGNOSIS — D62 Acute posthemorrhagic anemia: Secondary | ICD-10-CM | POA: Diagnosis not present

## 2023-04-28 DIAGNOSIS — S81012D Laceration without foreign body, left knee, subsequent encounter: Secondary | ICD-10-CM | POA: Diagnosis not present

## 2023-04-28 DIAGNOSIS — M1712 Unilateral primary osteoarthritis, left knee: Secondary | ICD-10-CM | POA: Diagnosis not present

## 2023-04-28 DIAGNOSIS — M160 Bilateral primary osteoarthritis of hip: Secondary | ICD-10-CM | POA: Diagnosis not present

## 2023-04-28 DIAGNOSIS — S81011D Laceration without foreign body, right knee, subsequent encounter: Secondary | ICD-10-CM | POA: Diagnosis not present

## 2023-04-28 DIAGNOSIS — I130 Hypertensive heart and chronic kidney disease with heart failure and stage 1 through stage 4 chronic kidney disease, or unspecified chronic kidney disease: Secondary | ICD-10-CM | POA: Diagnosis not present

## 2023-04-30 DIAGNOSIS — S81012D Laceration without foreign body, left knee, subsequent encounter: Secondary | ICD-10-CM | POA: Diagnosis not present

## 2023-04-30 DIAGNOSIS — I130 Hypertensive heart and chronic kidney disease with heart failure and stage 1 through stage 4 chronic kidney disease, or unspecified chronic kidney disease: Secondary | ICD-10-CM | POA: Diagnosis not present

## 2023-04-30 DIAGNOSIS — S81011D Laceration without foreign body, right knee, subsequent encounter: Secondary | ICD-10-CM | POA: Diagnosis not present

## 2023-04-30 DIAGNOSIS — M1712 Unilateral primary osteoarthritis, left knee: Secondary | ICD-10-CM | POA: Diagnosis not present

## 2023-04-30 DIAGNOSIS — D62 Acute posthemorrhagic anemia: Secondary | ICD-10-CM | POA: Diagnosis not present

## 2023-04-30 DIAGNOSIS — M160 Bilateral primary osteoarthritis of hip: Secondary | ICD-10-CM | POA: Diagnosis not present

## 2023-05-04 DIAGNOSIS — S81011D Laceration without foreign body, right knee, subsequent encounter: Secondary | ICD-10-CM | POA: Diagnosis not present

## 2023-05-04 DIAGNOSIS — D62 Acute posthemorrhagic anemia: Secondary | ICD-10-CM | POA: Diagnosis not present

## 2023-05-04 DIAGNOSIS — S81012D Laceration without foreign body, left knee, subsequent encounter: Secondary | ICD-10-CM | POA: Diagnosis not present

## 2023-05-04 DIAGNOSIS — I130 Hypertensive heart and chronic kidney disease with heart failure and stage 1 through stage 4 chronic kidney disease, or unspecified chronic kidney disease: Secondary | ICD-10-CM | POA: Diagnosis not present

## 2023-05-04 DIAGNOSIS — M160 Bilateral primary osteoarthritis of hip: Secondary | ICD-10-CM | POA: Diagnosis not present

## 2023-05-04 DIAGNOSIS — M1712 Unilateral primary osteoarthritis, left knee: Secondary | ICD-10-CM | POA: Diagnosis not present

## 2023-05-05 DIAGNOSIS — M160 Bilateral primary osteoarthritis of hip: Secondary | ICD-10-CM | POA: Diagnosis not present

## 2023-05-05 DIAGNOSIS — I130 Hypertensive heart and chronic kidney disease with heart failure and stage 1 through stage 4 chronic kidney disease, or unspecified chronic kidney disease: Secondary | ICD-10-CM | POA: Diagnosis not present

## 2023-05-05 DIAGNOSIS — S81012D Laceration without foreign body, left knee, subsequent encounter: Secondary | ICD-10-CM | POA: Diagnosis not present

## 2023-05-05 DIAGNOSIS — D62 Acute posthemorrhagic anemia: Secondary | ICD-10-CM | POA: Diagnosis not present

## 2023-05-05 DIAGNOSIS — S81011D Laceration without foreign body, right knee, subsequent encounter: Secondary | ICD-10-CM | POA: Diagnosis not present

## 2023-05-05 DIAGNOSIS — M1712 Unilateral primary osteoarthritis, left knee: Secondary | ICD-10-CM | POA: Diagnosis not present

## 2023-05-07 DIAGNOSIS — S81012D Laceration without foreign body, left knee, subsequent encounter: Secondary | ICD-10-CM | POA: Diagnosis not present

## 2023-05-07 DIAGNOSIS — I130 Hypertensive heart and chronic kidney disease with heart failure and stage 1 through stage 4 chronic kidney disease, or unspecified chronic kidney disease: Secondary | ICD-10-CM | POA: Diagnosis not present

## 2023-05-07 DIAGNOSIS — S81011D Laceration without foreign body, right knee, subsequent encounter: Secondary | ICD-10-CM | POA: Diagnosis not present

## 2023-05-07 DIAGNOSIS — M1712 Unilateral primary osteoarthritis, left knee: Secondary | ICD-10-CM | POA: Diagnosis not present

## 2023-05-07 DIAGNOSIS — M160 Bilateral primary osteoarthritis of hip: Secondary | ICD-10-CM | POA: Diagnosis not present

## 2023-05-07 DIAGNOSIS — D62 Acute posthemorrhagic anemia: Secondary | ICD-10-CM | POA: Diagnosis not present

## 2023-05-12 DIAGNOSIS — D62 Acute posthemorrhagic anemia: Secondary | ICD-10-CM | POA: Diagnosis not present

## 2023-05-12 DIAGNOSIS — S81012D Laceration without foreign body, left knee, subsequent encounter: Secondary | ICD-10-CM | POA: Diagnosis not present

## 2023-05-12 DIAGNOSIS — M160 Bilateral primary osteoarthritis of hip: Secondary | ICD-10-CM | POA: Diagnosis not present

## 2023-05-12 DIAGNOSIS — S81011D Laceration without foreign body, right knee, subsequent encounter: Secondary | ICD-10-CM | POA: Diagnosis not present

## 2023-05-12 DIAGNOSIS — M1712 Unilateral primary osteoarthritis, left knee: Secondary | ICD-10-CM | POA: Diagnosis not present

## 2023-05-12 DIAGNOSIS — I130 Hypertensive heart and chronic kidney disease with heart failure and stage 1 through stage 4 chronic kidney disease, or unspecified chronic kidney disease: Secondary | ICD-10-CM | POA: Diagnosis not present

## 2023-05-15 ENCOUNTER — Other Ambulatory Visit

## 2023-05-15 DIAGNOSIS — D649 Anemia, unspecified: Secondary | ICD-10-CM | POA: Diagnosis not present

## 2023-05-20 ENCOUNTER — Other Ambulatory Visit

## 2023-05-25 NOTE — Progress Notes (Signed)
 Remote pacemaker transmission.

## 2023-05-25 NOTE — Addendum Note (Signed)
 Addended by: Lott Rouleau A on: 05/25/2023 09:33 AM   Modules accepted: Orders

## 2023-06-04 ENCOUNTER — Telehealth: Payer: Self-pay | Admitting: Cardiovascular Disease

## 2023-06-04 NOTE — Telephone Encounter (Signed)
 Pt would like to know if she could use nasal spray. Please advise

## 2023-06-04 NOTE — Telephone Encounter (Signed)
 Any contraindication to nasal sprays with current med list?

## 2023-06-05 NOTE — Telephone Encounter (Signed)
 Per Chris Pavero,   "Would depend on the nasal spray. If it is just saline then ok to take"    Returned call to pt, no answer, unable to leave VM- no DPR on file.

## 2023-06-08 NOTE — Telephone Encounter (Signed)
 2nd call attempt- no answer, left detailed message (ok per DPR)

## 2023-06-11 ENCOUNTER — Telehealth: Payer: Self-pay | Admitting: Internal Medicine

## 2023-06-11 NOTE — Telephone Encounter (Signed)
 Spoke with Pt. Pt has no symptoms of Afib but her HR is "going up and down". States it is getting worse the past few weeks. Pt does not have symptoms. Pt states she was taken off digoxin  and its been doing this since shortly after. Will forward to Dr Carolynne Citron for recommendations.

## 2023-06-11 NOTE — Telephone Encounter (Signed)
 Patient c/o Palpitations:  STAT if patient reporting lightheadedness, shortness of breath, or chest pain  How long have you had palpitations/irregular HR/ Afib? Are you having the symptoms now?   No  Are you currently experiencing lightheadedness, SOB or CP?   No  Do you have a history of afib (atrial fibrillation) or irregular heart rhythm?   Yes  Have you checked your BP or HR? (document readings if available):    HR 103  Are you experiencing any other symptoms?   No   Patient stated she thinks she is in afib but as long as she has her oxygen  on it stays fair.

## 2023-06-14 NOTE — Telephone Encounter (Signed)
 Records suggest that I have not seen her in 2 years. If she is bothered by her heart rate fluctuating, I ll need to see her. If she is asymptomatic, no change in treatment.

## 2023-06-19 ENCOUNTER — Telehealth: Payer: Self-pay | Admitting: Cardiovascular Disease

## 2023-06-19 DIAGNOSIS — I5032 Chronic diastolic (congestive) heart failure: Secondary | ICD-10-CM

## 2023-06-19 DIAGNOSIS — Z5181 Encounter for therapeutic drug level monitoring: Secondary | ICD-10-CM

## 2023-06-19 DIAGNOSIS — I10 Essential (primary) hypertension: Secondary | ICD-10-CM

## 2023-06-19 DIAGNOSIS — M7989 Other specified soft tissue disorders: Secondary | ICD-10-CM

## 2023-06-19 NOTE — Telephone Encounter (Signed)
 Dr Theodis Fiscal reviewed, recommending compression socks and BMET/BNP   Advised patient, verbalized understanding

## 2023-06-19 NOTE — Telephone Encounter (Signed)
 Spoke with pt. Pt stated she is not having symptoms but will call back if she develops symptoms.

## 2023-06-19 NOTE — Addendum Note (Signed)
 Addended by: Marci Setter B on: 06/19/2023 02:01 PM   Modules accepted: Orders

## 2023-06-19 NOTE — Telephone Encounter (Signed)
 Pt c/o swelling/edema: STAT if pt has developed SOB within 24 hours  If swelling, where is the swelling located? legs  How much weight have you gained and in what time span? no  Have you gained 2 pounds in a day or 5 pounds in a week? no  Do you have a log of your daily weights (if so, list)? no  Are you currently taking a fluid pill? yes  Are you currently SOB? no  Have you traveled recently in a car or plane for an extended period of time?

## 2023-06-19 NOTE — Telephone Encounter (Signed)
 Returned call to patient,   She states she was told to go back to lasix  20mg . She states she has been taking 40mg  and it is wasn't working. She notes worsening swelling since her last appointment. She states her weight is stable. She states they are only not swollen right now but that is because she hasn't been standing. Advised patient to take her prescribed 20mg  with additional 20mg  as prescribed and we will check with Dr. Theodis Fiscal.

## 2023-06-22 DIAGNOSIS — Z5181 Encounter for therapeutic drug level monitoring: Secondary | ICD-10-CM | POA: Diagnosis not present

## 2023-06-22 DIAGNOSIS — I10 Essential (primary) hypertension: Secondary | ICD-10-CM | POA: Diagnosis not present

## 2023-06-22 DIAGNOSIS — I5032 Chronic diastolic (congestive) heart failure: Secondary | ICD-10-CM | POA: Diagnosis not present

## 2023-06-22 DIAGNOSIS — M7989 Other specified soft tissue disorders: Secondary | ICD-10-CM | POA: Diagnosis not present

## 2023-06-23 LAB — BRAIN NATRIURETIC PEPTIDE: BNP: 954.9 pg/mL — ABNORMAL HIGH (ref 0.0–100.0)

## 2023-06-23 LAB — BASIC METABOLIC PANEL WITH GFR
BUN/Creatinine Ratio: 20 (ref 12–28)
BUN: 33 mg/dL (ref 10–36)
CO2: 25 mmol/L (ref 20–29)
Calcium: 8 mg/dL — ABNORMAL LOW (ref 8.7–10.3)
Chloride: 102 mmol/L (ref 96–106)
Creatinine, Ser: 1.62 mg/dL — ABNORMAL HIGH (ref 0.57–1.00)
Glucose: 73 mg/dL (ref 70–99)
Potassium: 4.4 mmol/L (ref 3.5–5.2)
Sodium: 141 mmol/L (ref 134–144)
eGFR: 29 mL/min/{1.73_m2} — ABNORMAL LOW (ref >59)

## 2023-06-26 ENCOUNTER — Ambulatory Visit: Payer: Self-pay | Admitting: Cardiovascular Disease

## 2023-07-01 ENCOUNTER — Telehealth (HOSPITAL_BASED_OUTPATIENT_CLINIC_OR_DEPARTMENT_OTHER): Payer: Self-pay | Admitting: Cardiovascular Disease

## 2023-07-01 NOTE — Telephone Encounter (Signed)
 Hi there,  As providers in the office today can one of you look at this for me?   Pt called last week and was instructed to have labs done- labs have resulted but not yet reviewed- BNP elevated- please advise

## 2023-07-01 NOTE — Telephone Encounter (Signed)
 Per Dr. Mona-   Would increase to 80 mg daily -repeat BMET in 2 weeks and schedule follow-up.    Called patient, provided recommendations- patinet states she doesn't like to do that because it makes her use the bathroom too much- advised that is what we want to get that fluid off of her.    Scheduled pt for 6/30 @ 1:55pm with Rosaline Bane, NP ( will get repeat labs at appointment)

## 2023-07-01 NOTE — Telephone Encounter (Signed)
 Pt c/o swelling/edema: STAT if pt has developed SOB within 24 hours  If swelling, where is the swelling located? legs  How much weight have you gained and in what time span?  Not sure , but think she have gained a little weight  Have you gained 2 pounds in a day or 5 pounds in a week?   Do you have a log of your daily weights (if so, list)?   Are you currently taking a fluid pill? yes  Are you currently SOB?  Already on oxygen  full time  Have you traveled recently in a car or plane for an extended period of time? no

## 2023-07-05 NOTE — Progress Notes (Signed)
 Cardiology Office Note   Date:  07/06/2023  ID:  Raenell, Mensing Nov 04, 1924, MRN [id number] PCP: Okey Carlin Redbird, MD  [facility] Providers Cardiologist:  Annabella Scarce, MD Electrophysiologist:  Danelle Birmingham, MD     [facility] Atrial fibrillation on chronic anticoagulation Hypertension Aortic valve sclerosis CKD Stage IV Pulmonary embolus Tachy-brady syndrome s/p PPM implant On chronic O2  She initially established with hypertension clinic 02/2019.  Reporting history of hypertension for several years.  History of atrial fibrillation followed by heart care since 2017.  She reported BP was better controlled prior to developing A-fib.  She had lower extremity swelling on amlodipine  10 mg daily.  Doxazosin  was discontinued due to shortness of breath and heaviness in her chest.  She also stopped hydralazine  due to intolerance.  Diltiazem  also caused lower extremity edema.  Abnormal pulmonary function test in 2019.  Carotid Dopplers 01/2020 showed 1 to 39% stenosis bilaterally.  Echo 01/2020 revealed LVEF 65 to 70% with severe LVH and grade 3 diastolic dysfunction.  It showed severe biatrial enlargement and moderate to severe tricuspid regurgitation.  Aortic stenosis was mild.  PASP was 65 mmHg.  She was prescribed daily Lasix .  Hospitalization 03/2021 with pulmonary embolism.  Hematology recommended increasing Eliquis  to 5 mg twice daily.  She was also treated for pneumonia. Echo 03/2022 EF 65-70%, mild LVH, grade 2 diastolic dysfunction.  Elevated blood pressure 10/2022.  She was given hydralazine  but did not tolerate it.  Digoxin  had been discontinued due to elevated levels.  SBP running in the 170s.  Clonidine  was increased to 0.3 mg and then switched to patch, which unfortunately caused skin irritation.  Her daughter noted that BP previously better managed on minoxidil  so this was resumed and clonidine  was discontinued.  Valsartan  was added and she subsequently developed low blood  pressures.  Previous antihypertensives: Amlodipine  - edema at 10 mg Diltiazem  - leg swelling Doxazosin  Hydralazine  Clonidine  patch - skin irritation  Last cardiology clinic visit was 03/04/2023 with Dr. Scarce.  She was managing BP with carvedilol  twice daily and minoxidil  around lunchtime.  Her blood pressure tends to rise around 530 or 6 PM and she uses valsartan  as a rescue medication if BP exceeds 160/80 mmHg.  She had taken valsartan  4 or 5 times recently with stabilization of BP between 120 to 150 mmHg, averaging in the 130s.  She reported feeling less energetic since stopping clonidine  which she attributed to aging.  She has the most energy at night.  No significant leg swelling.  She was also taking Lasix  20 mg daily with an option for an extra 20 mg if swelling occurs.  On amiodarone  for rhythm control.  Follow-up in 6 months was recommended.  Home health nurse called in April 2025 to report SBP's in 110s. Plan to change minoxidil  to prn for SBP > 150 mmHg.   Called the office 06/19/23 to note increased leg edema. BNP 954.9 on 06/22/23, recommendation per Dr. Mona to increase Lasix  to 80 mg daily and soon follow-up.   History of Present Illness Discussed the use of AI scribe software for clinical note transcription with the patient, who gave verbal consent to proceed.  History of Present Illness LABREA ECCLESTON is a very pleasant 89 year old female who is here today for evaluation of leg edema. She is accompanied by one of her care givers (she has 24 hour care).  She is on chronic O2 at 2L which she states has been present during hospital  admission in 2023.  She reports increased leg edema for approximately 2 weeks despite consistently elevating her legs.  She admits she was not wearing compression stockings until today.  She denies increased sodium intake and admits she often does not eat very much.  She has not been monitoring weight consistently, but caregiver reports home weight  typically 120-125 lbs with increase last week to 131 lbs. Lasix  was increased from 40 mg to 80 mg daily five days ago without significant diuresis or reduction in swelling. A small leg wound leaks fluid continuously. She does not report dyspnea but caregiver feels that she has a more difficult getting her breath when talking. She sleeps on one and a half pillows and denies orthopnea or PND but feels exhausted after getting into bed because she feels that her bed is too high. Reports she often feels full before eating. Due to previous kidney issues and external catheter, she does not feel the urge to urinate or feel empty after urination.  She denies chest pain, palpitations, presyncope, syncope.  BP has been well-controlled at home.   ROS: + leg edema  Studies Reviewed       No results found for: LIPOA  Risk Assessment/Calculations  CHA2DS2-VASc Score = 5   This indicates a 7.2% annual risk of stroke. The patient's score is based upon: CHF History: 1 HTN History: 1 Diabetes History: 0 Stroke History: 0 Vascular Disease History: 0 Age Score: 2 Gender Score: 1            Physical Exam VS:  BP 118/70 (BP Location: Right Arm, Patient Position: Sitting, Cuff Size: Normal)   Pulse 79   Ht 5' 5 (1.651 m)   Wt 141 lb (64 kg)   SpO2 90%   BMI 23.46 kg/m    Wt Readings from Last 3 Encounters:  07/06/23 141 lb (64 kg)  03/09/23 125 lb (56.7 kg)  03/04/23 125 lb (56.7 kg)    GEN: Well nourished, well developed in no acute distress NECK: No JVD; No carotid bruits CARDIAC: RRR, no murmurs, rubs, gallops RESPIRATORY:  Clear to auscultation without rales, wheezing or rhonchi  ABDOMEN: Soft, non-tender, non-distended EXTREMITIES:  No edema; No deformity   Assessment & Plan Bilateral leg edema Chronic HFpEF Dyspnea on exertion Persistent bilateral leg edema  x 2 weeks despite increased Lasix  dose to 80 mg daily. BNP obtained 6/16 elevated > 900. She reports no significant  improvement in urine output on higher dose Lasix . On chronic O2 therapy will possible increase in SOB but no orthopnea or PND. Weight has increased, although unclear how significantly given she is not monitoring daily weight. Suspicion for possible gut edema. Consistent leg elevation but just put on compression stockings today. We will discontinue Lasix  and start torsemide 40 mg daily. We will recheck kidney function and BNP in one week. Continue fluid restriction and low sodium diet. Monitor weight daily. Wear compression stockings during the day and remove them at night, and continue leg elevation. We will call to check on her in 3 days to see how she is responding to change in diuretic therapy.   Persistent Atrial Fibrillation on Chronic Anticoagulation Atrial fib may be contributing to fluid retention, however HR is well controlled. Continue amiodarone  100 mg daily for rhythm control, continue carvedilol  for rate control.  No bleeding concerns.  Continue Eliquis  2.5 mg twice daily which is appropriate dose for stroke prevention for CHA2DS2-VASc score of 5.  Hypertension BP has been difficult to control  in the past but is well controlled today. We are changing diuretic therapy. Continue to monitor BP routinely.   Chronic Kidney Disease   BMET 06/22/23 with stable renal function, Scr 1.62. Creatinine has to been up to 2. She has been taking increased Lasix  80 mg x 5 days with no significant improvement in diuresis. We will have her d/c furosemide  and start torsemide 40 mg daily and recheck kidney function in one week.          Dispo: TBD after lab testing/responsiveness to change in therapy  Signed, Rosaline Bane, NP-C

## 2023-07-06 ENCOUNTER — Encounter (HOSPITAL_BASED_OUTPATIENT_CLINIC_OR_DEPARTMENT_OTHER): Payer: Self-pay | Admitting: Nurse Practitioner

## 2023-07-06 ENCOUNTER — Ambulatory Visit (INDEPENDENT_AMBULATORY_CARE_PROVIDER_SITE_OTHER): Admitting: Nurse Practitioner

## 2023-07-06 VITALS — BP 118/70 | HR 79 | Ht 65.0 in | Wt 141.0 lb

## 2023-07-06 DIAGNOSIS — Z7901 Long term (current) use of anticoagulants: Secondary | ICD-10-CM

## 2023-07-06 DIAGNOSIS — N1832 Chronic kidney disease, stage 3b: Secondary | ICD-10-CM | POA: Diagnosis not present

## 2023-07-06 DIAGNOSIS — I4819 Other persistent atrial fibrillation: Secondary | ICD-10-CM

## 2023-07-06 DIAGNOSIS — I10 Essential (primary) hypertension: Secondary | ICD-10-CM

## 2023-07-06 DIAGNOSIS — R600 Localized edema: Secondary | ICD-10-CM | POA: Diagnosis not present

## 2023-07-06 DIAGNOSIS — I5032 Chronic diastolic (congestive) heart failure: Secondary | ICD-10-CM

## 2023-07-06 MED ORDER — TORSEMIDE 20 MG PO TABS: ORAL_TABLET | ORAL | 3 refills | Status: DC

## 2023-07-06 NOTE — Patient Instructions (Signed)
 Medication Instructions:   DISCONTINUE Lasix .  START Torsemide two (2) tablets by mouth ( 40 mg) X 3 days and Rosaline will call you on Thursday to check on you.   *If you need a refill on your cardiac medications before your next appointment, please call your pharmacy*  Lab Work:  Your physician recommends that you return for lab work on Monday July 7 . No fasting.    If you have labs (blood work) drawn today and your tests are completely normal, you will receive your results only by: MyChart Message (if you have MyChart) OR A paper copy in the mail If you have any lab test that is abnormal or we need to change your treatment, we will call you to review the results.  Testing/Procedures:  None ordered.   Follow-Up: At [facility], you and your health needs are our priority.  As part of our continuing mission to provide you with exceptional heart care, our providers are all part of one team.  This team includes your primary Cardiologist (physician) and Advanced Practice Providers or APPs (Physician Assistants and Nurse Practitioners) who all work together to provide you with the care you need, when you need it.  Your next appointment:      Provider:        We recommend signing up for the patient portal called MyChart.  Sign up information is provided on this After Visit Summary.  MyChart is used to connect with patients for Virtual Visits (Telemedicine).  Patients are able to view lab/test results, encounter notes, upcoming appointments, etc.  Non-urgent messages can be sent to your provider as well.   To learn more about what you can do with MyChart, go to [email or web address].

## 2023-07-08 DIAGNOSIS — R0902 Hypoxemia: Secondary | ICD-10-CM | POA: Diagnosis not present

## 2023-07-08 DIAGNOSIS — D509 Iron deficiency anemia, unspecified: Secondary | ICD-10-CM | POA: Diagnosis not present

## 2023-07-09 ENCOUNTER — Telehealth: Payer: Self-pay | Admitting: Nurse Practitioner

## 2023-07-09 NOTE — Telephone Encounter (Signed)
 Called and spoke with patient who states leg edema has not improved significantly since changing to torsemide from furosemide  on 7/1. Her daily weights are; 7/1 131 lb 7/2 138 lb 7/3 139 lb She is not having increased shortness of breath, chest discomfort,  orthopnea or PND. Admits some of the weights may have been taken later in the day. Advised her to try to weigh every morning at the same time in the same amount of clothing. Advised her to continue torsemide 40 mg daily until she gets lab work completed on 7/7. We will give guidance for diuretic therapy and follow-up after lab results have been reviewed.

## 2023-07-13 DIAGNOSIS — Z5181 Encounter for therapeutic drug level monitoring: Secondary | ICD-10-CM | POA: Diagnosis not present

## 2023-07-13 DIAGNOSIS — I4891 Unspecified atrial fibrillation: Secondary | ICD-10-CM | POA: Diagnosis not present

## 2023-07-13 DIAGNOSIS — I5032 Chronic diastolic (congestive) heart failure: Secondary | ICD-10-CM | POA: Diagnosis not present

## 2023-07-14 ENCOUNTER — Ambulatory Visit: Payer: Self-pay | Admitting: Nurse Practitioner

## 2023-07-14 ENCOUNTER — Telehealth (HOSPITAL_BASED_OUTPATIENT_CLINIC_OR_DEPARTMENT_OTHER): Payer: Self-pay | Admitting: Cardiovascular Disease

## 2023-07-14 LAB — PRO B NATRIURETIC PEPTIDE: NT-Pro BNP: 6812 pg/mL — ABNORMAL HIGH (ref 0–738)

## 2023-07-14 LAB — BASIC METABOLIC PANEL WITH GFR
BUN/Creatinine Ratio: 20 (ref 12–28)
BUN: 35 mg/dL (ref 10–36)
CO2: 26 mmol/L (ref 20–29)
Calcium: 8 mg/dL — ABNORMAL LOW (ref 8.7–10.3)
Chloride: 100 mmol/L (ref 96–106)
Creatinine, Ser: 1.77 mg/dL — ABNORMAL HIGH (ref 0.57–1.00)
Glucose: 64 mg/dL — ABNORMAL LOW (ref 70–99)
Potassium: 4.3 mmol/L (ref 3.5–5.2)
Sodium: 141 mmol/L (ref 134–144)
eGFR: 26 mL/min/1.73 — ABNORMAL LOW (ref >59)

## 2023-07-14 NOTE — Telephone Encounter (Signed)
 Pt c/o swelling/edema: STAT if pt has developed SOB within 24 hours  If swelling, where is the swelling located?   Right knee  How much weight have you gained and in what time span?   Yes  Have you gained 2 pounds in a day or 5 pounds in a week?   Unknown  Do you have a log of your daily weights (if so, list)?   Are you currently taking a fluid pill?   yes  Are you currently SOB?   No  Have you traveled recently in a car or plane for an extended period of time?  No  Patient is concerned she has been having swelling in her knee.

## 2023-07-14 NOTE — Telephone Encounter (Signed)
 Spoke with patient regarding swelling  She just wanted ok to remove support stocking  Patient stated her knee is swelling and feels like support stocking contributing to it  Denies any pain  Did have sandwich last night that was probably high in sodium  Takes 2 torsemide  daily  Wt up 3 lbs today   Discussed with Dr Raford and will have patient increase Torsemide  to 40 mg twice a day for 3 days only   Advised patient, verbalized understanding

## 2023-07-17 ENCOUNTER — Ambulatory Visit: Payer: Medicare Other

## 2023-07-17 DIAGNOSIS — I495 Sick sinus syndrome: Secondary | ICD-10-CM

## 2023-07-18 LAB — CUP PACEART REMOTE DEVICE CHECK
Battery Remaining Longevity: 48 mo
Battery Remaining Percentage: 75 %
Brady Statistic RA Percent Paced: 30 %
Brady Statistic RV Percent Paced: 14 %
Date Time Interrogation Session: 20250711111300
Implantable Lead Connection Status: 753985
Implantable Lead Connection Status: 753985
Implantable Lead Implant Date: 20210716
Implantable Lead Implant Date: 20210716
Implantable Lead Location: 753859
Implantable Lead Location: 753860
Implantable Lead Model: 7840
Implantable Lead Model: 7841
Implantable Lead Serial Number: 1017229
Implantable Lead Serial Number: 1090224
Implantable Pulse Generator Implant Date: 20210716
Lead Channel Impedance Value: 519 Ohm
Lead Channel Impedance Value: 610 Ohm
Lead Channel Pacing Threshold Amplitude: 1.3 V
Lead Channel Pacing Threshold Pulse Width: 0.4 ms
Lead Channel Setting Pacing Amplitude: 2 V
Lead Channel Setting Pacing Amplitude: 2.5 V
Lead Channel Setting Pacing Pulse Width: 0.4 ms
Lead Channel Setting Sensing Sensitivity: 2.5 mV
Pulse Gen Serial Number: 547553
Zone Setting Status: 755011

## 2023-07-20 ENCOUNTER — Ambulatory Visit: Payer: Self-pay | Admitting: Internal Medicine

## 2023-07-20 DIAGNOSIS — H02833 Dermatochalasis of right eye, unspecified eyelid: Secondary | ICD-10-CM | POA: Diagnosis not present

## 2023-07-20 DIAGNOSIS — H02836 Dermatochalasis of left eye, unspecified eyelid: Secondary | ICD-10-CM | POA: Diagnosis not present

## 2023-07-20 DIAGNOSIS — H353211 Exudative age-related macular degeneration, right eye, with active choroidal neovascularization: Secondary | ICD-10-CM | POA: Diagnosis not present

## 2023-07-20 DIAGNOSIS — H353113 Nonexudative age-related macular degeneration, right eye, advanced atrophic without subfoveal involvement: Secondary | ICD-10-CM | POA: Diagnosis not present

## 2023-07-20 DIAGNOSIS — H353124 Nonexudative age-related macular degeneration, left eye, advanced atrophic with subfoveal involvement: Secondary | ICD-10-CM | POA: Diagnosis not present

## 2023-07-20 DIAGNOSIS — H35351 Cystoid macular degeneration, right eye: Secondary | ICD-10-CM | POA: Diagnosis not present

## 2023-07-22 NOTE — Progress Notes (Unsigned)
 Cardiology Office Note   Date:  07/23/2023  ID:  DANARIA LARSEN, DOB 10-10-1924, MRN [id number] PCP: Okey Carlin Redbird, MD  [facility] Providers Cardiologist:  Annabella Scarce, MD Electrophysiologist:  Danelle Birmingham, MD     [facility] Atrial fibrillation on chronic anticoagulation Hypertension Aortic valve sclerosis CKD Stage IV Pulmonary embolus Tachy-brady syndrome s/p PPM implant On chronic O2  She initially established with hypertension clinic 02/2019.  Reporting history of hypertension for several years.  History of atrial fibrillation followed by heart care since 2017.  She reported BP was better controlled prior to developing A-fib.  She had lower extremity swelling on amlodipine  10 mg daily.  Doxazosin  was discontinued due to shortness of breath and heaviness in her chest.  She also stopped hydralazine  due to intolerance.  Diltiazem  also caused lower extremity edema.  Abnormal pulmonary function test in 2019.  Carotid Dopplers 01/2020 showed 1 to 39% stenosis bilaterally.  Echo 01/2020 revealed LVEF 65 to 70% with severe LVH and grade 3 diastolic dysfunction.  It showed severe biatrial enlargement and moderate to severe tricuspid regurgitation.  Aortic stenosis was mild.  PASP was 65 mmHg.  She was prescribed daily Lasix .  Hospitalization 03/2021 with pulmonary embolism.  Hematology recommended increasing Eliquis  to 5 mg twice daily.  She was also treated for pneumonia. Echo 03/2022 EF 65-70%, mild LVH, grade 2 diastolic dysfunction.  Elevated blood pressure 10/2022.  She was given hydralazine  but did not tolerate it.  Digoxin  had been discontinued due to elevated levels.  SBP running in the 170s.  Clonidine  was increased to 0.3 mg and then switched to patch, which unfortunately caused skin irritation.  Her daughter noted that BP previously better managed on minoxidil  so this was resumed and clonidine  was discontinued.  Valsartan  was added and she subsequently developed low blood  pressures.  Previous antihypertensives: Amlodipine  - edema at 10 mg Diltiazem  - leg swelling Doxazosin  Hydralazine  Clonidine  patch - skin irritation  Last cardiology clinic visit was 03/04/2023 with Dr. Scarce.  She was managing BP with carvedilol  twice daily and minoxidil  around lunchtime.  Her blood pressure tends to rise around 530 or 6 PM and she uses valsartan  as a rescue medication if BP exceeds 160/80 mmHg.  She had taken valsartan  4 or 5 times recently with stabilization of BP between 120 to 150 mmHg, averaging in the 130s.  She reported feeling less energetic since stopping clonidine  which she attributed to aging.  She has the most energy at night.  No significant leg swelling.  She was also taking Lasix  20 mg daily with an option for an extra 20 mg if swelling occurs.  On amiodarone  for rhythm control.  Follow-up in 6 months was recommended.  Home health nurse called in April 2025 to report SBP's in 110s. Plan to change minoxidil  to prn for SBP > 150 mmHg.   Called the office 06/19/23 to note increased leg edema. BNP 954.9 on 06/22/23, recommendation per Dr. Mona to increase Lasix  to 80 mg daily and soon follow-up.   Seen in clinic by me on 07/06/23 for leg edema and accompanied by a caregiver. She has 24 hour care. She is on chronic O2 at 2L which she states has been present during hospital admission in 2023.  She reports increased leg edema for approximately 2 weeks despite consistently elevating her legs.  She admits she was not wearing compression stockings until today.  She denies increased sodium intake and admits she often does not eat very much.  She has not been monitoring weight consistently, but caregiver reports home weight typically 120-125 lbs with increase last week to 131 lbs. Lasix  was increased from 40 mg to 80 mg daily five days ago without significant diuresis or reduction in swelling. A small leg wound leaks fluid continuously. She does not report dyspnea but caregiver  feels that she has a more difficult getting her breath when talking. She sleeps on one and a half pillows and denies orthopnea or PND but feels exhausted after getting into bed because she feels that her bed is too high. Often feels full before eating. Due to previous kidney issues and external catheter, she does not feel the urge to urinate or feel empty after urination.  She denies chest pain, palpitations, presyncope, syncope.  BP well-controlled at home. We changed her Lasix  to torsemide , start 40 mg daily x 3 days then reduce to 20 mg daily. Follow-up pro-BNP was elevated and kidney function was diminished but stable. She was advised to continue torsemide  40 mg daily because weight remained elevated. She noted slight improvement  in leg edema.   She contacted our office on 07/14/23 with swelling up to knee, weight gain of 3 lbs and compression felt to be causing increased swelling. She was advised to continue leg elevation and take torsemide  40 mg twice daily for 3 days.   Note, remote device check 07/18/23 revealed 100% a fib burden since May.   History of Present Illness Discussed the use of AI scribe software for clinical note transcription with the patient, who gave verbal consent to proceed.  History of Present Illness JAMYLA ARD is a very pleasant 89 year old female who is here today with her daughter for follow-up of edema. She is having persistent leg swelling up to her thighs, fluid leakage, and swelling of her left hand. She wrapped her leg to avoid leakage but it has continued to seep. She has been wearing compression stockings but there is concern that they are impeding blood flow at times.  She says that despite higher doses of diuretics, the swelling persist and she does not have brisk urine output. She occasionally experiences shortness of breath, though she did not feel short of breath getting from the car to the exam room today.  She denies difficulty laying flat, PND, chest  discomfort.  Oxygen  saturation was low initially but improved with rest. She is on chronic O2. Her daughter reports that pt prepares her own medications and does not like when family attempts to help.    ROS: + leg edema  Studies Reviewed EKG Interpretation Date/Time:  Thursday July 23 2023 14:35:30 EDT Ventricular Rate:  101 PR Interval:    QRS Duration:  100 QT Interval:  352 QTC Calculation: 456 R Axis:   -21  Text Interpretation: Atrial fibrillation with rapid ventricular response Incomplete right bundle branch block Septal infarct , age undetermined ST & T wave abnormality, consider inferior ischemia Confirmed by Percy Browning [phone]) on 07/23/2023 2:38:34 PM     No results found for: LIPOA  Risk Assessment/Calculations  CHA2DS2-VASc Score = 5   This indicates a 7.2% annual risk of stroke. The patient's score is based upon: CHF History: 1 HTN History: 1 Diabetes History: 0 Stroke History: 0 Vascular Disease History: 0 Age Score: 2 Gender Score: 1            Physical Exam VS:  BP 104/68 (BP Location: Right Arm, Patient Position: Sitting, Cuff Size: Normal)   Pulse ROLLEN)  55   Ht 5' 5 (1.651 m)   Wt 149 lb (67.6 kg)   SpO2 (!) 84%   BMI 24.79 kg/m    Wt Readings from Last 3 Encounters:  07/23/23 149 lb (67.6 kg)  07/06/23 141 lb (64 kg)  03/09/23 125 lb (56.7 kg)    GEN: Frail, chronically ill appearing in no acute distress NECK: No JVD; No carotid bruits CARDIAC: RRR, no murmurs, rubs, gallops RESPIRATORY:  Clear to auscultation without rales, wheezing or rhonchi  ABDOMEN: Soft, non-tender, non-distended EXTREMITIES:  bilateral pitting edema from feet to thighs    Assessment & Plan Bilateral leg edema Chronic HFpEF Dyspnea on exertion Hand swelling Persistent bilateral leg edema  We changed her diuretic from furosemide  to torsemide  40 mg daily on 07/06/23 with instructions to take 40 mg BID for 3 days on two separate occasions.  Despite these  changes, she has persistent leg edema with weeping of fluid.  She feels shortness of breath is about the same and denies orthopnea or PND.  She reports urine output is not brisk. She does not have clear documentation of daily weights, but according to our scale she is up 8 pounds in 2-1/2 weeks. There is a question as to whether she is taking torsemide  40 mg. Daughter will attempt to check home medications but pt gets angry when family gets involved with medications. We discussed hospital admission for fluid management but pt prefers to stay at home. PCP checked her kidney function a few days ago per pt and family and there were no significant concerns. Orthopedics supplied materials for patient to better wrap her legs and we provided appropriate technique. Encouraged arm elevation for swelling of left hand. -Home health referral placed -I do not see benefit of continuing to increase diuretics if there is no improvement in swelling or output. Continue torsemide  40 mg daily. -Continue fluid restriction and low sodium diet.  -Monitor weight daily.  -Wear leg wraps until home health evaluation    Persistent Atrial Fibrillation on Chronic Anticoagulation Atrial fib may be contributing to fluid retention, however HR is not significantly elevated and overall appears well controlled. No bleeding concerns.   -Continue Eliquis  2.5 mg twice daily which is appropriate dose for stroke prevention for CHA2DS2-VASc score of 5. -Continue amiodarone  100 mg daily for rhythm control, continue carvedilol  for rate control.    Hypertension BP is well controlled in clinic, somewhat soft -Continue to monitor BP routinely  Chronic Kidney Disease   BMET 06/22/23 with stable renal function, Scr 1.62. Creatinine has to been up to 2. She has been taking increased Lasix  80 mg x 5 days with no significant improvement in diuresis. We will have her d/c furosemide  and start torsemide  40 mg daily and recheck kidney function in one  week.  Goals of Care We have placed home health referral for management of edema. Consider palliative care referral.          Dispo: 3 months with me  Signed, Rosaline Bane, NP-C

## 2023-07-23 ENCOUNTER — Telehealth: Payer: Self-pay | Admitting: Cardiovascular Disease

## 2023-07-23 ENCOUNTER — Telehealth (HOSPITAL_BASED_OUTPATIENT_CLINIC_OR_DEPARTMENT_OTHER): Payer: Self-pay | Admitting: Nurse Practitioner

## 2023-07-23 ENCOUNTER — Encounter (HOSPITAL_BASED_OUTPATIENT_CLINIC_OR_DEPARTMENT_OTHER): Payer: Self-pay | Admitting: Nurse Practitioner

## 2023-07-23 ENCOUNTER — Ambulatory Visit (HOSPITAL_BASED_OUTPATIENT_CLINIC_OR_DEPARTMENT_OTHER): Admitting: Nurse Practitioner

## 2023-07-23 VITALS — BP 104/68 | HR 55 | Ht 65.0 in | Wt 149.0 lb

## 2023-07-23 DIAGNOSIS — I10 Essential (primary) hypertension: Secondary | ICD-10-CM

## 2023-07-23 DIAGNOSIS — N1832 Chronic kidney disease, stage 3b: Secondary | ICD-10-CM

## 2023-07-23 DIAGNOSIS — I4819 Other persistent atrial fibrillation: Secondary | ICD-10-CM | POA: Diagnosis not present

## 2023-07-23 DIAGNOSIS — Z950 Presence of cardiac pacemaker: Secondary | ICD-10-CM

## 2023-07-23 DIAGNOSIS — R600 Localized edema: Secondary | ICD-10-CM

## 2023-07-23 DIAGNOSIS — M7989 Other specified soft tissue disorders: Secondary | ICD-10-CM

## 2023-07-23 DIAGNOSIS — I5032 Chronic diastolic (congestive) heart failure: Secondary | ICD-10-CM | POA: Diagnosis not present

## 2023-07-23 MED ORDER — TORSEMIDE 20 MG PO TABS
ORAL_TABLET | ORAL | Status: DC
Start: 2023-07-23 — End: 2023-07-23

## 2023-07-23 MED ORDER — TORSEMIDE 20 MG PO TABS: ORAL_TABLET | ORAL | 0 refills | Status: DC

## 2023-07-23 NOTE — Telephone Encounter (Signed)
 Pt c/o swelling/edema: STAT if pt has developed SOB within 24 hours  If swelling, where is the swelling located? Legs,hand  How much weight have you gained and in what time span?    Have you gained 2 pounds in a day or 5 pounds in a week?    Do you have a log of your daily weights (if so, list)?   Are you currently taking a fluid pill? yes  Are you currently SOB? no  Have you traveled recently in a car or plane for an extended period of time?  Pt states legs were weeping last night

## 2023-07-23 NOTE — Telephone Encounter (Signed)
.  Pt c/o medication issue:  1. Name of Medication:   torsemide  (DEMADEX ) 20 MG tablet  furosemide   2. How are you currently taking this medication (dosage and times per day)? N/a  3. Are you having a reaction (difficulty breathing--STAT)? no  4. What is your medication issue? Patients daughter wants to know which medication Rosaline wanted her mother to take. She does not have any torsemide  if that is the correct medication. Please advise.

## 2023-07-23 NOTE — Patient Instructions (Signed)
 Medication Instructions:   PLEASE take your torsemide  two (2) tablets by mouth ( 40 mg) daily in the am.   *If you need a refill on your cardiac medications before your next appointment, please call your pharmacy*  Lab Work:  None ordered.  If you have labs (blood work) drawn today and your tests are completely normal, you will receive your results only by: MyChart Message (if you have MyChart) OR A paper copy in the mail If you have any lab test that is abnormal or we need to change your treatment, we will call you to review the results.  Testing/Procedures:  None ordered.  Follow-Up: At [facility] you and your health needs are our priority.  As part of our continuing mission to provide you with exceptional heart care, our providers are all part of one team.  This team includes your primary Cardiologist (physician) and Advanced Practice Providers or APPs (Physician Assistants and Nurse Practitioners) who all work together to provide you with the care you need, when you need it.  Your next appointment:   3 month(s)  Provider:   Rosaline Bane, NP    We recommend signing up for the patient portal called MyChart.  Sign up information is provided on this After Visit Summary.  MyChart is used to connect with patients for Virtual Visits (Telemedicine).  Patients are able to view lab/test results, encounter notes, upcoming appointments, etc.  Non-urgent messages can be sent to your provider as well.   To learn more about what you can do with MyChart, go to [email or web address].   Other Instructions  You have been referred to [facility].  You will hear from someone at that office.

## 2023-07-23 NOTE — Telephone Encounter (Signed)
 Last night her left leg above the knee above the knee was weeping (outside of leg), but not weeping this morning.    Her weights are the same every day.  She said she was not going to be able to make her appointment due to leg weeping and potentially being wet.  I encouraged her to come and bring a wash cloth incase it starts to weep but the provider really needed to see if it was.  She will keep her appointment today.

## 2023-07-23 NOTE — Telephone Encounter (Signed)
 Returned a call back to both the pt and daughter Allison Norman about torsemide  dosing/regimen, as advise at today's OV appt with Allison Bane NP.   Advised both the pt and daughter that per Allison Bane NP and as copied/referenced below, pt is to continue torsemide  40 mg po daily, with a repeat BMET in one week.  Daughter states the pt will need a refill of the torsemide  sent to [facility] pharmacy now, for the pt is almost completely out.  Torsemide  40 mg po daily was sent to the pts confirmed pharmacy of choice.   Both the pt and daughter verbalized understanding and agrees with this plan.      Assessment & Plan Bilateral leg edema Chronic HFpEF Dyspnea on exertion Hand swelling Persistent bilateral leg edema  We changed her diuretic from furosemide  to torsemide  40 mg daily on 07/06/23 with instructions to take 40 mg BID for 3 days on two separate occasions.  Despite these changes, she has persistent leg edema with weeping of fluid.  She feels shortness of breath is about the same and denies orthopnea or PND.  She reports urine output is not brisk. She does not have clear documentation of daily weights, but according to our scale she is up 8 pounds in 2-1/2 weeks. There is a question as to whether she is taking torsemide  40 mg. Daughter will attempt to check home medications but pt gets angry when family gets involved with medications. We discussed hospital admission for fluid management but pt prefers to stay at home. PCP checked her kidney function a few days ago per pt and family and there were no significant concerns. Orthopedics supplied materials for patient to better wrap her legs and we provided appropriate technique. Encouraged arm elevation for swelling of left hand. -Home health referral placed -I do not see benefit of continuing to increase diuretics if there is no improvement in swelling or output. Continue torsemide  40 mg daily. -Continue fluid restriction and low sodium diet.   -Monitor weight daily.  -Wear leg wraps until home health evaluation     Persistent Atrial Fibrillation on Chronic Anticoagulation Atrial fib may be contributing to fluid retention, however HR is not significantly elevated and overall appears well controlled. No bleeding concerns.   -Continue Eliquis  2.5 mg twice daily which is appropriate dose for stroke prevention for CHA2DS2-VASc score of 5. -Continue amiodarone  100 mg daily for rhythm control, continue carvedilol  for rate control.     Hypertension BP is well controlled in clinic, somewhat soft -Continue to monitor BP routinely   Chronic Kidney Disease   BMET 06/22/23 with stable renal function, Scr 1.62. Creatinine has to been up to 2. She has been taking increased Lasix  80 mg x 5 days with no significant improvement in diuresis. We will have her d/c furosemide  and start torsemide  40 mg daily and recheck kidney function in one week.

## 2023-07-28 ENCOUNTER — Emergency Department (HOSPITAL_COMMUNITY)

## 2023-07-28 ENCOUNTER — Inpatient Hospital Stay (HOSPITAL_COMMUNITY)
Admission: EM | Admit: 2023-07-28 | Discharge: 2023-08-05 | DRG: 291 | Disposition: A | Discharge status: Home / Self Care | Attending: Internal Medicine | Admitting: Internal Medicine

## 2023-07-28 DIAGNOSIS — I4819 Other persistent atrial fibrillation: Secondary | ICD-10-CM | POA: Diagnosis present

## 2023-07-28 DIAGNOSIS — I5033 Acute on chronic diastolic (congestive) heart failure: Secondary | ICD-10-CM | POA: Diagnosis not present

## 2023-07-28 DIAGNOSIS — Z9012 Acquired absence of left breast and nipple: Secondary | ICD-10-CM

## 2023-07-28 DIAGNOSIS — Z7901 Long term (current) use of anticoagulants: Secondary | ICD-10-CM | POA: Diagnosis not present

## 2023-07-28 DIAGNOSIS — Z888 Allergy status to other drugs, medicaments and biological substances status: Secondary | ICD-10-CM

## 2023-07-28 DIAGNOSIS — Z841 Family history of disorders of kidney and ureter: Secondary | ICD-10-CM

## 2023-07-28 DIAGNOSIS — R918 Other nonspecific abnormal finding of lung field: Secondary | ICD-10-CM | POA: Diagnosis not present

## 2023-07-28 DIAGNOSIS — Z7189 Other specified counseling: Secondary | ICD-10-CM | POA: Diagnosis not present

## 2023-07-28 DIAGNOSIS — I509 Heart failure, unspecified: Principal | ICD-10-CM

## 2023-07-28 DIAGNOSIS — I4891 Unspecified atrial fibrillation: Secondary | ICD-10-CM | POA: Diagnosis not present

## 2023-07-28 DIAGNOSIS — J9811 Atelectasis: Secondary | ICD-10-CM | POA: Diagnosis present

## 2023-07-28 DIAGNOSIS — D509 Iron deficiency anemia, unspecified: Secondary | ICD-10-CM | POA: Diagnosis present

## 2023-07-28 DIAGNOSIS — Z8614 Personal history of Methicillin resistant Staphylococcus aureus infection: Secondary | ICD-10-CM

## 2023-07-28 DIAGNOSIS — K219 Gastro-esophageal reflux disease without esophagitis: Secondary | ICD-10-CM | POA: Diagnosis present

## 2023-07-28 DIAGNOSIS — E8809 Other disorders of plasma-protein metabolism, not elsewhere classified: Secondary | ICD-10-CM | POA: Diagnosis not present

## 2023-07-28 DIAGNOSIS — J9620 Acute and chronic respiratory failure, unspecified whether with hypoxia or hypercapnia: Secondary | ICD-10-CM | POA: Diagnosis not present

## 2023-07-28 DIAGNOSIS — N179 Acute kidney failure, unspecified: Secondary | ICD-10-CM | POA: Diagnosis present

## 2023-07-28 DIAGNOSIS — Z85828 Personal history of other malignant neoplasm of skin: Secondary | ICD-10-CM | POA: Diagnosis not present

## 2023-07-28 DIAGNOSIS — Z950 Presence of cardiac pacemaker: Secondary | ICD-10-CM

## 2023-07-28 DIAGNOSIS — Z880 Allergy status to penicillin: Secondary | ICD-10-CM

## 2023-07-28 DIAGNOSIS — M810 Age-related osteoporosis without current pathological fracture: Secondary | ICD-10-CM | POA: Diagnosis not present

## 2023-07-28 DIAGNOSIS — M353 Polymyalgia rheumatica: Secondary | ICD-10-CM | POA: Diagnosis not present

## 2023-07-28 DIAGNOSIS — J9621 Acute and chronic respiratory failure with hypoxia: Secondary | ICD-10-CM | POA: Diagnosis present

## 2023-07-28 DIAGNOSIS — I272 Pulmonary hypertension, unspecified: Secondary | ICD-10-CM | POA: Diagnosis present

## 2023-07-28 DIAGNOSIS — Z66 Do not resuscitate: Secondary | ICD-10-CM | POA: Diagnosis present

## 2023-07-28 DIAGNOSIS — I110 Hypertensive heart disease with heart failure: Secondary | ICD-10-CM | POA: Diagnosis not present

## 2023-07-28 DIAGNOSIS — R54 Age-related physical debility: Secondary | ICD-10-CM | POA: Diagnosis present

## 2023-07-28 DIAGNOSIS — Z79899 Other long term (current) drug therapy: Secondary | ICD-10-CM

## 2023-07-28 DIAGNOSIS — R609 Edema, unspecified: Secondary | ICD-10-CM | POA: Diagnosis not present

## 2023-07-28 DIAGNOSIS — R000 Tachycardia, unspecified: Secondary | ICD-10-CM | POA: Diagnosis not present

## 2023-07-28 DIAGNOSIS — J90 Pleural effusion, not elsewhere classified: Secondary | ICD-10-CM | POA: Diagnosis not present

## 2023-07-28 DIAGNOSIS — Z853 Personal history of malignant neoplasm of breast: Secondary | ICD-10-CM | POA: Diagnosis not present

## 2023-07-28 DIAGNOSIS — I10 Essential (primary) hypertension: Secondary | ICD-10-CM | POA: Diagnosis not present

## 2023-07-28 DIAGNOSIS — R0602 Shortness of breath: Secondary | ICD-10-CM | POA: Diagnosis not present

## 2023-07-28 DIAGNOSIS — N184 Chronic kidney disease, stage 4 (severe): Secondary | ICD-10-CM | POA: Diagnosis not present

## 2023-07-28 DIAGNOSIS — Z881 Allergy status to other antibiotic agents status: Secondary | ICD-10-CM

## 2023-07-28 DIAGNOSIS — H3530 Unspecified macular degeneration: Secondary | ICD-10-CM | POA: Diagnosis present

## 2023-07-28 DIAGNOSIS — I130 Hypertensive heart and chronic kidney disease with heart failure and stage 1 through stage 4 chronic kidney disease, or unspecified chronic kidney disease: Secondary | ICD-10-CM | POA: Diagnosis present

## 2023-07-28 DIAGNOSIS — Z8249 Family history of ischemic heart disease and other diseases of the circulatory system: Secondary | ICD-10-CM

## 2023-07-28 DIAGNOSIS — Z515 Encounter for palliative care: Secondary | ICD-10-CM | POA: Diagnosis not present

## 2023-07-28 DIAGNOSIS — Z885 Allergy status to narcotic agent status: Secondary | ICD-10-CM

## 2023-07-28 DIAGNOSIS — Z8701 Personal history of pneumonia (recurrent): Secondary | ICD-10-CM

## 2023-07-28 DIAGNOSIS — Z87891 Personal history of nicotine dependence: Secondary | ICD-10-CM | POA: Diagnosis not present

## 2023-07-28 LAB — BRAIN NATRIURETIC PEPTIDE: B Natriuretic Peptide: 822.1 pg/mL — ABNORMAL HIGH (ref 0.0–100.0)

## 2023-07-28 LAB — COMPREHENSIVE METABOLIC PANEL WITH GFR
ALT: 15 U/L (ref 0–44)
AST: 23 U/L (ref 15–41)
Albumin: 2.7 g/dL — ABNORMAL LOW (ref 3.5–5.0)
Alkaline Phosphatase: 99 U/L (ref 38–126)
Anion gap: 10 (ref 5–15)
BUN: 41 mg/dL — ABNORMAL HIGH (ref 8–23)
CO2: 28 mmol/L (ref 22–32)
Calcium: 8 mg/dL — ABNORMAL LOW (ref 8.9–10.3)
Chloride: 100 mmol/L (ref 98–111)
Creatinine, Ser: 2.09 mg/dL — ABNORMAL HIGH (ref 0.44–1.00)
GFR, Estimated: 21 mL/min — ABNORMAL LOW (ref >60)
Glucose, Bld: 108 mg/dL — ABNORMAL HIGH (ref 70–99)
Potassium: 3.8 mmol/L (ref 3.5–5.1)
Sodium: 138 mmol/L (ref 135–145)
Total Bilirubin: 1.1 mg/dL (ref 0.0–1.2)
Total Protein: 6.3 g/dL — ABNORMAL LOW (ref 6.5–8.1)

## 2023-07-28 LAB — CBC WITH DIFFERENTIAL/PLATELET
Abs Immature Granulocytes: 0 K/uL (ref 0.00–0.07)
Basophils Absolute: 0.1 K/uL (ref 0.0–0.1)
Basophils Relative: 2 %
Eosinophils Absolute: 0 K/uL (ref 0.0–0.5)
Eosinophils Relative: 0 %
HCT: 37.2 % (ref 36.0–46.0)
Hemoglobin: 10.9 g/dL — ABNORMAL LOW (ref 12.0–15.0)
Lymphocytes Relative: 12 %
Lymphs Abs: 0.6 K/uL — ABNORMAL LOW (ref 0.7–4.0)
MCH: 26.8 pg (ref 26.0–34.0)
MCHC: 29.3 g/dL — ABNORMAL LOW (ref 30.0–36.0)
MCV: 91.6 fL (ref 80.0–100.0)
Monocytes Absolute: 0.3 K/uL (ref 0.1–1.0)
Monocytes Relative: 6 %
Neutro Abs: 3.8 K/uL (ref 1.7–7.7)
Neutrophils Relative %: 80 %
Platelets: 155 K/uL (ref 150–400)
RBC: 4.06 MIL/uL (ref 3.87–5.11)
RDW: 21.4 % — ABNORMAL HIGH (ref 11.5–15.5)
WBC: 4.7 K/uL (ref 4.0–10.5)
nRBC: 0 % (ref 0.0–0.2)
nRBC: 0 /100{WBCs}

## 2023-07-28 LAB — CREATININE, SERUM
Creatinine, Ser: 2.04 mg/dL — ABNORMAL HIGH (ref 0.44–1.00)
GFR, Estimated: 22 mL/min — ABNORMAL LOW (ref >60)

## 2023-07-28 LAB — TROPONIN I (HIGH SENSITIVITY)
Troponin I (High Sensitivity): 26 ng/L — ABNORMAL HIGH (ref <18)
Troponin I (High Sensitivity): 28 ng/L — ABNORMAL HIGH (ref <18)

## 2023-07-28 MED ORDER — ENOXAPARIN SODIUM 40 MG/0.4ML IJ SOSY: 40.0000 mg | PREFILLED_SYRINGE | INTRAMUSCULAR | Status: DC

## 2023-07-28 MED ORDER — FUROSEMIDE 10 MG/ML IJ SOLN
80.0000 mg | Freq: Two times a day (BID) | INTRAMUSCULAR | Status: DC
  Administered 2023-07-29: 80 mg via INTRAVENOUS
  Filled 2023-07-28: qty 8

## 2023-07-28 MED ORDER — FUROSEMIDE 10 MG/ML IJ SOLN
60.0000 mg | Freq: Once | INTRAMUSCULAR | Status: AC
  Administered 2023-07-28: 60 mg via INTRAVENOUS
  Filled 2023-07-28: qty 6

## 2023-07-28 MED ORDER — PANTOPRAZOLE SODIUM 40 MG PO TBEC
40.0000 mg | DELAYED_RELEASE_TABLET | Freq: Every day | ORAL | Status: DC
  Administered 2023-07-28 – 2023-07-31 (×4): 40 mg via ORAL
  Filled 2023-07-28 (×7): qty 1

## 2023-07-28 MED ORDER — APIXABAN 2.5 MG PO TABS
2.5000 mg | ORAL_TABLET | Freq: Two times a day (BID) | ORAL | Status: DC
  Administered 2023-07-28 – 2023-08-05 (×16): 2.5 mg via ORAL
  Filled 2023-07-28 (×18): qty 1

## 2023-07-28 MED ORDER — CARVEDILOL 12.5 MG PO TABS
25.0000 mg | ORAL_TABLET | Freq: Two times a day (BID) | ORAL | Status: DC
  Administered 2023-07-28 – 2023-07-29 (×2): 25 mg via ORAL
  Filled 2023-07-28 (×2): qty 2

## 2023-07-28 MED ORDER — AMIODARONE HCL 100 MG PO TABS
100.0000 mg | ORAL_TABLET | Freq: Every day | ORAL | Status: DC
Start: 2023-07-29 — End: 2023-08-05
  Administered 2023-07-29 – 2023-08-05 (×8): 100 mg via ORAL
  Filled 2023-07-28 (×3): qty 1
  Filled 2023-07-28: qty 0.5
  Filled 2023-07-28 (×5): qty 1

## 2023-07-28 NOTE — Plan of Care (Signed)

## 2023-07-28 NOTE — H&P (Signed)
 History and Physical    Patient: Allison Norman FMW:[id number] DOB: [birth year] DOA: 07/28/2023 DOS: the patient was seen and examined on 07/28/2023 PCP: Okey Carlin Redbird, MD  Patient coming from: Home  Chief Complaint:  Chief Complaint  Patient presents with   Shortness of Breath   HPI: Allison Norman is a 89 y.o. female with medical history significant of Afib s/p DCCV in 2017 with re-occurrence in 2018; now rate controlled, HTN, CKD3b, and breast/skin cancer p/w HFpEF exacerbation and AKI.  Pt reports being in her USOH until this past week. Per pt her breathing and LE edema has worsened. Pt notes that she does not eat fast food, and only limits her self to a few snacks (cheese and crackers). Of note, pt was evaluated by her OP cards on 7/17 and has been on increasing doses of diuretics for some time with little to no relief; in fact, PO lasix  40mg  daily increased to 80mg  daily, before being switched to torsemide  40mg  daily and recently increased to torsemide  80mg  daily.  In the ED, pt tachypneic on 2L [place]. Labs notable for Cr 2.09 (Cr 1.77 on 7/7), and BNP 822. CXR showed Small bilateral pleural effusions with bibasilar atelectasis. EDP ordered IV lasix  60mg  and requested medicine admission for ongoing care.   Review of Systems: As mentioned in the history of present illness. All other systems reviewed and are negative. Past Medical History:  Diagnosis Date   Acute blood loss anemia 08/27/2020   Acute hypoxemic respiratory failure (HCC) 03/24/2021   Acute urinary retention 09/07/2020   Aortic stenosis 08/20/2021   Mild-moderate.  Repeat echo in one year.   Atrial fibrillation (HCC) 2017   a. s/p DCCV in 10/2015  b. recurrent in 08/2016 --> rate-control pursued.    Atrial fibrillation with RVR (HCC) 08/31/2015   Cancer (HCC)    Breast   CAP (community acquired pneumonia) 03/24/2021   CKD (chronic kidney disease)    GCA (giant cell arteritis) (HCC)    Hordeolum internum left  lower eyelid 06/28/2019   Hypertension    Laceration of right lower extremity 08/28/2020   Macular degeneration    Methicillin resistant Staphylococcus aureus infection 07/02/2009   Annotation: septic right knee  Qualifier: Diagnosis of   By: Lutricia OBIE Aquas      IMO SNOMED Dx Update Oct 2024     Osteoporosis    PMR (polymyalgia rheumatica) (HCC)    Pulmonary hypertension, unspecified (HCC) 02/14/2021   Resistant hypertension 03/15/2019   Shortness of breath 03/15/2019   Skin cancer    Past Surgical History:  Procedure Laterality Date   CARDIOVERSION N/A 10/18/2015   Procedure: CARDIOVERSION;  Surgeon: Oneil JAYSON Parchment, MD;  Location: [facility] ENDOSCOPY;  Service: Cardiovascular;  Laterality: N/A;   CARDIOVERSION N/A 02/20/2017   Procedure: CARDIOVERSION;  Surgeon: Mona Vinie JAYSON, MD;  Location: [facility] ENDOSCOPY;  Service: Cardiovascular;  Laterality: N/A;   CARDIOVERSION N/A 06/18/2017   Procedure: CARDIOVERSION;  Surgeon: Francyne Headland, MD;  Location: [facility] ENDOSCOPY;  Service: Cardiovascular;  Laterality: N/A;   CARDIOVERSION N/A 12/21/2018   Procedure: CARDIOVERSION;  Surgeon: Alveta Aleene PARAS, MD;  Location: [facility] ENDOSCOPY;  Service: Cardiovascular;  Laterality: N/A;   IR CATHETER TUBE CHANGE  02/28/2021   KNEE SURGERY  2013   MASTECTOMY  1998   left side   PACEMAKER IMPLANT N/A 07/22/2019   Procedure: PACEMAKER IMPLANT;  Surgeon: Waddell Danelle ORN, MD;  Location: [facility] INVASIVE CV LAB;  Service: Cardiovascular;  Laterality: N/A;  Social History:  reports that she quit smoking about 56 years ago. Her smoking use included cigarettes. She has never used smokeless tobacco. She reports that she does not drink alcohol and does not use drugs.  Allergies  Allergen Reactions   Codeine Itching   Penicillins Itching and Rash   Cardura  [Doxazosin ] Other (See Comments)    Congestion Wheezing Heavy feeling in chest   Hydralazine  Other (See Comments)    Felt bad  Could hear/feel pulse in  head  Other Reaction(s): severe ringing in ears   Vibramycin [Doxycycline] Other (See Comments)    Chest tightness     Family History  Problem Relation Age of Onset   Stroke Mother    Kidney failure Father        died at 30   Heart disease Sister        died at 24   Heart disease Sister        died at 62   Breast cancer Daughter     Prior to Admission medications   Medication Sig Start Date End Date Taking? Authorizing Provider  acetaminophen  (TYLENOL ) 325 MG tablet Take 2 tablets (650 mg total) by mouth every 6 (six) hours as needed for mild pain (pain score 1-3) (or Fever >/= 101). 03/10/23   Raenelle Coria, MD  amiodarone  (PACERONE ) 200 MG tablet Take 1/2 (one-half) tablet by mouth once daily 02/13/23   Waddell Danelle ORN, MD  apixaban  (ELIQUIS ) 2.5 MG TABS tablet Take 1 tablet by mouth twice daily 02/23/23   Raford Riggs, MD  apixaban  (ELIQUIS ) 2.5 MG TABS tablet Take 2.5 mg by mouth 2 (two) times daily. 04/02/21   [provider]  carvedilol  (COREG ) 25 MG tablet TAKE 1 TABLET BY MOUTH TWICE DAILY WITH A MEAL 03/24/23   Raford Riggs, MD  diazepam  (VALIUM ) 2 MG tablet Take 2 mg by mouth at bedtime. 06/23/16   [provider]  minoxidil  (LONITEN ) 2.5 MG tablet Take one tablet daily as needed at noon for SBP greater than 150. 04/15/23   Pavero, Lonni, RPH  Multiple Vitamins-Minerals (PRESERVISION AREDS 2) CAPS Take 1 capsule by mouth daily.    [provider]  pantoprazole  (PROTONIX ) 40 MG tablet Take 1 tablet (40 mg total) by mouth daily. 03/10/23   Raenelle Coria, MD  torsemide  (DEMADEX ) 20 MG tablet Take two (2) tablets by mouth ( 40 mg) daily in the am. 07/23/23   Swinyer, Rosaline HERO, NP  valsartan  (DIOVAN ) 160 MG tablet Take 160 mg by mouth See admin instructions. Take 1 tablet (160mg ) by mouth in the evening as needed for sBP > 160.    [provider]    Physical Exam: Vitals:   07/28/23 1415 07/28/23 1430 07/28/23 1445 07/28/23 1500   BP:      Pulse:   81 86  Resp: (!) 24 (!) 21 20 (!) 25  Temp:      TempSrc:      SpO2: 100% 100% 100% 100%   General: Alert, oriented x3, resting comfortably in no acute distress Respiratory: Bibasilar rales; no wheezing Cardiovascular: Regular rate and rhythm w/o m/r/g   Data Reviewed:  Lab Results  Component Value Date   WBC 4.7 07/28/2023   HGB 10.9 (L) 07/28/2023   HCT 37.2 07/28/2023   MCV 91.6 07/28/2023   PLT 155 07/28/2023   Lab Results  Component Value Date   GLUCOSE 108 (H) 07/28/2023   CALCIUM  8.0 (L) 07/28/2023   NA 138 07/28/2023  K 3.8 07/28/2023   CO2 28 07/28/2023   CL 100 07/28/2023   BUN 41 (H) 07/28/2023   CREATININE 2.09 (H) 07/28/2023   Lab Results  Component Value Date   ALT 15 07/28/2023   AST 23 07/28/2023   ALKPHOS 99 07/28/2023   BILITOT 1.1 07/28/2023   Lab Results  Component Value Date   INR 1.2 01/15/2021   INR 1.5 (H) 08/28/2020   INR 1.1 10/11/2015    Radiology: DG Chest Portable 1 View Result Date: 07/28/2023 CLINICAL DATA:  Shortness of breath. EXAM: PORTABLE CHEST 1 VIEW COMPARISON:  03/09/2023. FINDINGS: Stable cardiomegaly. Stable left chest wall pacemaker. Small bilateral pleural effusions with bibasilar atelectasis. Bilateral interstitial prominence, most pronounced at the lung bases, suspicious for interstitial pulmonary edema. No pneumothorax. Diffuse osseous demineralization. No acute osseous abnormality. IMPRESSION: 1. Small bilateral pleural effusions with bibasilar atelectasis. 2. Cardiomegaly with findings suggestive of interstitial pulmonary edema. Electronically Signed   By: Harrietta Sherry M.D.   On: 07/28/2023 13:35    Assessment and Plan: 51F h/o Afibv s/p DCCV in 2017 with re-occurrence in 2018; now rate controlled, HTN, CKD3b, and breast/skin cancer p/w HFpEF exacerbation and AKI.  AKI Presumed post-renal congestion iso ADHF -IV diuretics per below -Strict I&Os and daily weights (standing  preferred) -F/u BMP daily -Renally dose medications for CrCl -Avoid lovenox , NSAIDs, morphine , Fleet's phosphate enema, regular insulin, contrast; no gadolinium for MRI to avoid nephrogenic systemic fibrosis -Consider renal US  and nephrology consult if worsening AKI or lack of improvementening AKI or lack of improvement  HFpEF exacerbation -Palliative care consult per OP cards provider note on 7/17 -RD consulted; apprec eval/recs -IV lasix  80mg  BID for now; goal net neg 1-2L/d; strict I/Os; daily standing weights; K>4/Mg>2 -Wean O2 as tolerated -Ambulatory pulse ox prior to d/c -PTA amiodarone  100mg  daily, coreg  25mg  BID -HOLD pta valsartan   GERD -PTA PPI   Advance Care Planning:   Code Status: Full Code   Consults: N/A  Family Communication: N/A  Severity of Illness: The appropriate patient status for this patient is INPATIENT. Inpatient status is judged to be reasonable and necessary in order to provide the required intensity of service to ensure the patient's safety. The patient's presenting symptoms, physical exam findings, and initial radiographic and laboratory data in the context of their chronic comorbidities is felt to place them at high risk for further clinical deterioration. Furthermore, it is not anticipated that the patient will be medically stable for discharge from the hospital within 2 midnights of admission.   * I certify that at the point of admission it is my clinical judgment that the patient will require inpatient hospital care spanning beyond 2 midnights from the point of admission due to high intensity of service, high risk for further deterioration and high frequency of surveillance required.*   ------- I spent 55 minutes reviewing previous labs/notes, obtaining separate history at the bedside, counseling/discussing the treatment plan outlined above, ordering medications/tests, and performing clinical documentation.  Author: Marsha Ada, MD 07/28/2023 3:23  PM  For on call review [email or web address].

## 2023-07-28 NOTE — ED Triage Notes (Signed)
 BIB EMS from home, patient was having increased SHOB that started today. Patient wears baseline 2LNC at home, granddaughter who is a nurse increased her oxygen  to 4L and patient saturated to 97%, unsure of what her sats were at baseline. Patient states she is feeling weak and tired this week.

## 2023-07-28 NOTE — ED Provider Notes (Signed)
 [place] EMERGENCY DEPARTMENT AT [facility] Provider Note   CSN: [id number] Arrival date & time: 07/28/23  1257     Patient presents with: Shortness of Breath   Allison Norman is a 89 y.o. female.   Patient here with ongoing shortness of breath.  She has been volume overloaded here recently.  Has been titrating her torsemide .  She has a history of A-fib on Eliquis  Coreg  amiodarone .  Having hard time walking around.  Having hard time breathing at rest now.  Has been retaining fluid in her arms and legs.  Has been compliant with her medications otherwise.  She has had some general weakness.  She is normally on 2 L of oxygen  and having increased oxygen  at times of 4 L.  She denies any chest pain cough fever or chills.  The history is provided by the patient and a caregiver.       Prior to Admission medications   Medication Sig Start Date End Date Taking? Authorizing Provider  acetaminophen  (TYLENOL ) 325 MG tablet Take 2 tablets (650 mg total) by mouth every 6 (six) hours as needed for mild pain (pain score 1-3) (or Fever >/= 101). 03/10/23   Raenelle Coria, MD  amiodarone  (PACERONE ) 200 MG tablet Take 1/2 (one-half) tablet by mouth once daily 02/13/23   Waddell Danelle ORN, MD  apixaban  (ELIQUIS ) 2.5 MG TABS tablet Take 1 tablet by mouth twice daily 02/23/23   Raford Riggs, MD  apixaban  (ELIQUIS ) 2.5 MG TABS tablet Take 2.5 mg by mouth 2 (two) times daily. 04/02/21   [provider]  carvedilol  (COREG ) 25 MG tablet TAKE 1 TABLET BY MOUTH TWICE DAILY WITH A MEAL 03/24/23   Raford Riggs, MD  diazepam  (VALIUM ) 2 MG tablet Take 2 mg by mouth at bedtime. 06/23/16   [provider]  minoxidil  (LONITEN ) 2.5 MG tablet Take one tablet daily as needed at noon for SBP greater than 150. 04/15/23   Pavero, Lonni, RPH  Multiple Vitamins-Minerals (PRESERVISION AREDS 2) CAPS Take 1 capsule by mouth daily.    [provider]  pantoprazole  (PROTONIX ) 40 MG tablet  Take 1 tablet (40 mg total) by mouth daily. 03/10/23   Raenelle Coria, MD  torsemide  (DEMADEX ) 20 MG tablet Take two (2) tablets by mouth ( 40 mg) daily in the am. 07/23/23   Swinyer, Rosaline HERO, NP  valsartan  (DIOVAN ) 160 MG tablet Take 160 mg by mouth See admin instructions. Take 1 tablet (160mg ) by mouth in the evening as needed for sBP > 160.    [provider]    Allergies: Codeine, Penicillins, Cardura  [doxazosin ], Hydralazine , and Vibramycin [doxycycline]    Review of Systems  Updated Vital Signs BP 113/76   Pulse 81   Temp 98 F (36.7 C) (Oral)   Resp 20   SpO2 100%   Physical Exam Vitals and nursing note reviewed.  Constitutional:      General: She is not in acute distress.    Appearance: She is well-developed. She is not ill-appearing.  HENT:     Head: Normocephalic and atraumatic.     Mouth/Throat:     Mouth: Mucous membranes are moist.  Eyes:     Extraocular Movements: Extraocular movements intact.     Conjunctiva/sclera: Conjunctivae normal.     Pupils: Pupils are equal, round, and reactive to light.  Cardiovascular:     Rate and Rhythm: Normal rate and regular rhythm.     Pulses: Normal pulses.     Heart  sounds: Normal heart sounds. No murmur heard. Pulmonary:     Effort: Tachypnea present. No respiratory distress.     Breath sounds: Decreased breath sounds and rhonchi present.  Abdominal:     Palpations: Abdomen is soft.     Tenderness: There is no abdominal tenderness.  Musculoskeletal:        General: No swelling.     Cervical back: Normal range of motion and neck supple.     Right lower leg: Edema present.     Left lower leg: Edema present.  Skin:    General: Skin is warm and dry.     Capillary Refill: Capillary refill takes less than 2 seconds.  Neurological:     General: No focal deficit present.     Mental Status: She is alert.  Psychiatric:        Mood and Affect: Mood normal.     (all labs ordered are listed, but only abnormal  results are displayed) Labs Reviewed  CBC WITH DIFFERENTIAL/PLATELET - Abnormal; Notable for the following components:      Result Value   Hemoglobin 10.9 (*)    MCHC 29.3 (*)    RDW 21.4 (*)    Lymphs Abs 0.6 (*)    All other components within normal limits  COMPREHENSIVE METABOLIC PANEL WITH GFR  BRAIN NATRIURETIC PEPTIDE  TROPONIN I (HIGH SENSITIVITY)  TROPONIN I (HIGH SENSITIVITY)    EKG: EKG Interpretation Date/Time:  Tuesday July 28 2023 13:14:56 EDT Ventricular Rate:  88 PR Interval:    QRS Duration:  107 QT Interval:  412 QTC Calculation: 467 R Axis:   105  Text Interpretation: Atrial fibrillation Probable RVH w/ secondary repol abnormality Confirmed by Ruthe Cornet [phone]) on 07/28/2023 1:20:52 PM  Radiology: [facility] Chest Portable 1 View Result Date: 07/28/2023 CLINICAL DATA:  Shortness of breath. EXAM: PORTABLE CHEST 1 VIEW COMPARISON:  03/09/2023. FINDINGS: Stable cardiomegaly. Stable left chest wall pacemaker. Small bilateral pleural effusions with bibasilar atelectasis. Bilateral interstitial prominence, most pronounced at the lung bases, suspicious for interstitial pulmonary edema. No pneumothorax. Diffuse osseous demineralization. No acute osseous abnormality. IMPRESSION: 1. Small bilateral pleural effusions with bibasilar atelectasis. 2. Cardiomegaly with findings suggestive of interstitial pulmonary edema. Electronically Signed   By: Harrietta Sherry M.D.   On: 07/28/2023 13:35     Procedures   Medications Ordered in the ED  furosemide  (LASIX ) injection 60 mg (has no administration in time range)                                    Medical Decision Making Amount and/or Complexity of Data Reviewed Labs: ordered. Radiology: ordered.  Risk Prescription drug management.   Allison Norman is here with shortness of breath.  History of heart failure, A-fib on Eliquis  amiodarone  Coreg .  Been trying to titrate her torsemide  recently.  Continue to notice weight  gain in the arms and legs.  Increased oxygen  requirement.  Increased work of breathing.  She lives at home with caregivers.  Saw cardiology recently tried to titrate her torsemide  but has not had a great response.  She has got slightly increased work of breathing here.  She looks volume overloaded.  She denies any chest pain.  Differential diagnosis likely worsening heart failure/volume overload/CKD/less likely ACS.  Have no concern for PE as she is anticoagulated.  Could be infectious/pneumonia process.  Will check CBC CMP BNP troponin chest x-ray.  Chest  x-ray consistent with volume overload.  EKG shows rate controlled atrial fibrillation.  I do think this is likely volume overload and will give her a dose of IV Lasix .  \Anticipate admission for close monitoring of her electrolytes kidney function and her breathing.   Awaiting remaining lab work.  Handed off to oncoming ED staff with patient pending remaining labs.  I do anticipate admission given her failed outpatient treatment.  This chart was dictated using voice recognition software.  Despite best efforts to proofread,  errors can occur which can change the documentation meaning.      Final diagnoses:  Acute on chronic congestive heart failure, unspecified heart failure type [facility])    ED Discharge Orders     None          Ruthe Cornet, DO 07/28/23 1456

## 2023-07-28 NOTE — ED Notes (Signed)
 Pt alert, NAD, calm, interactive, resps e/u, speaking in clear complete sentences. VSS. Family at [facility]. Given lasix , and sip of water. Repeat trop sent.

## 2023-07-28 NOTE — Plan of Care (Signed)
 Was initially called about Hospital [facility] home pre-screen.  Given age and comorbidities, case discussed w/ Dr. Georgina and will plan for formal palliative care evaluation.  Notification of palliative care evaluation discussed w/ patient's daughter [name] Sharps ([phone]) who also reports to be patients POA who expressed understanding.  Attempted to call [name] Stain with no answer.  Will also consult PT to evaluate.

## 2023-07-29 ENCOUNTER — Other Ambulatory Visit: Payer: Self-pay

## 2023-07-29 ENCOUNTER — Encounter (HOSPITAL_COMMUNITY)

## 2023-07-29 ENCOUNTER — Other Ambulatory Visit (HOSPITAL_COMMUNITY)

## 2023-07-29 ENCOUNTER — Encounter (HOSPITAL_COMMUNITY): Payer: Self-pay | Admitting: Hospitalist

## 2023-07-29 DIAGNOSIS — Z515 Encounter for palliative care: Secondary | ICD-10-CM | POA: Diagnosis not present

## 2023-07-29 DIAGNOSIS — I5033 Acute on chronic diastolic (congestive) heart failure: Secondary | ICD-10-CM

## 2023-07-29 DIAGNOSIS — Z7189 Other specified counseling: Secondary | ICD-10-CM

## 2023-07-29 DIAGNOSIS — Z66 Do not resuscitate: Secondary | ICD-10-CM | POA: Diagnosis not present

## 2023-07-29 MED ORDER — CARVEDILOL 25 MG PO TABS
25.0000 mg | ORAL_TABLET | Freq: Two times a day (BID) | ORAL | Status: DC
  Administered 2023-07-29: 25 mg via ORAL
  Filled 2023-07-29 (×5): qty 1

## 2023-07-29 MED ORDER — FUROSEMIDE 10 MG/ML IJ SOLN
80.0000 mg | Freq: Two times a day (BID) | INTRAMUSCULAR | Status: DC
  Administered 2023-07-29: 80 mg via INTRAVENOUS
  Filled 2023-07-29 (×4): qty 8

## 2023-07-29 MED ORDER — MELATONIN 5 MG PO TABS: 5.0000 mg | ORAL_TABLET | Freq: Every day | ORAL | Status: DC

## 2023-07-29 MED ORDER — POTASSIUM CHLORIDE CRYS ER 20 MEQ PO TBCR
40.0000 meq | EXTENDED_RELEASE_TABLET | Freq: Once | ORAL | Status: AC
  Administered 2023-07-29: 40 meq via ORAL
  Filled 2023-07-29: qty 2

## 2023-07-29 MED ORDER — ONDANSETRON HCL 4 MG PO TABS
4.0000 mg | ORAL_TABLET | Freq: Four times a day (QID) | ORAL | Status: DC | PRN
  Filled 2023-07-29: qty 1

## 2023-07-29 MED ORDER — MELATONIN 5 MG PO TABS
10.0000 mg | ORAL_TABLET | Freq: Every day | ORAL | Status: DC
  Administered 2023-07-29 – 2023-08-04 (×7): 10 mg via ORAL
  Filled 2023-07-29 (×8): qty 2

## 2023-07-29 MED ORDER — FUROSEMIDE 10 MG/ML IJ SOLN: 80.0000 mg | Freq: Two times a day (BID) | INTRAMUSCULAR | Status: DC

## 2023-07-29 MED ORDER — SALINE SPRAY 0.65 % NA SOLN
1.0000 | NASAL | Status: DC | PRN
  Filled 2023-07-29: qty 44

## 2023-07-29 NOTE — Plan of Care (Signed)

## 2023-07-29 NOTE — Progress Notes (Addendum)
 PROGRESS NOTE    Allison Norman  FMW:[id number] DOB: 1924-03-09 DOA: 07/28/2023 PCP: Okey Carlin Redbird, MD  89/F with history of persistent atrial fibrillation, chronic diastolic CHF, CKD 3B/4, breast cancer.  Presented to the ED with worsening dyspnea and edema, prior to admission seen by cardiology, diuretic dose increased without much benefit. - In the ER, tachypneic, mildly hypoxic, placed on 2 L O2, A-fib largely rate controlled, proBNP 6812, troponin 26, 28, creatinine 2.09, GFR 21 Chest x-ray noted small pleural effusions and interstitial edema  Subjective: -Feels fair, oral intake has been poor, continues to have dyspnea today  Assessment and Plan:  Acute on chronic diastolic CHF - She has hypoalbuminemia as well contributing to third spacing - Last echo 3/24 with a EF 65-70%, grade 2 DD, normal RV - Continue IV Lasix  80 Mg twice daily likely 1 to 2 days, transition to torsemide  at discharge considering hypoalbuminemia - GDMT limited by CKD 4 - Conservative management on account of advanced age frailty and multiple medical problems - Discussed CODE STATUS with the patient this morning, she is agreeable to DNR - Palliative care consulted and following, continue conservative management for now, consider hospice when she declines further  Persistent atrial fibrillation -Prior history of cardioversions but appears to have been in persistent A-fib for years now - Continue amiodarone , carvedilol  and Eliquis   CKD 4 - Creatinine relatively stable at baseline of 1.6-2 - GFR is 21, monitor with diuresis - Will discontinue valsartan   History of breast cancer Noted   DVT prophylaxis: Eliquis  Code Status: Now changed to DNR Family Communication: Granddaughter Development worker, community at bedside Disposition Plan: Possibly hospital at home   Objective: Vitals:   07/28/23 2143 07/29/23 0057 07/29/23 0520 07/29/23 0910  BP: 127/88 120/74 131/88 99/65  Pulse: 70 (!) 49 (!) 101 98  Resp: 20  17 19 18   Temp:  (!) 97.5 F (36.4 C) (!) 97.4 F (36.3 C) 98 F (36.7 C)  TempSrc:  Oral Oral Oral  SpO2: 95% 94% 94% 91%  Weight:   66.5 kg   Height:        Intake/Output Summary (Last 24 hours) at 07/29/2023 0934 Last data filed at 07/29/2023 0908 Gross per 24 hour  Intake 240 ml  Output 750 ml  Net -510 ml   Filed Weights   07/28/23 1728 07/29/23 0520  Weight: 64.9 kg 66.5 kg    Examination:  General exam: Does not chronically ill elderly female sitting up in the recliner, AAO x 2 HEENT: Positive JVD Respiratory system: Few basilar Rales, otherwise clear Cardiovascular system: S1 & S2 heard, irregular Abd: nondistended, soft and nontender.Normal bowel sounds heard. Central nervous system: Alert and oriented. No focal neurological deficits. Extremities: 2+ edema in upper thighs, 1+ in lower legs Skin: No rashes Psychiatry: Flat affect    Data Reviewed:   CBC: Recent Labs  Lab 07/28/23 1402  WBC 4.7  NEUTROABS 3.8  HGB 10.9*  HCT 37.2  MCV 91.6  PLT 155   Basic Metabolic Panel: Recent Labs  Lab 07/28/23 1402 07/28/23 1507  NA 138  --   K 3.8  --   CL 100  --   CO2 28  --   GLUCOSE 108*  --   BUN 41*  --   CREATININE 2.09* 2.04*  CALCIUM  8.0*  --    GFR: Estimated Creatinine Clearance: 14.5 mL/min (A) (by C-G formula based on SCr of 2.04 mg/dL (H)). Liver Function Tests: Recent Labs  Lab 07/28/23  1402  AST 23  ALT 15  ALKPHOS 99  BILITOT 1.1  PROT 6.3*  ALBUMIN 2.7*   No results for input(s): LIPASE, AMYLASE in the last 168 hours. No results for input(s): AMMONIA in the last 168 hours. Coagulation Profile: No results for input(s): INR, PROTIME in the last 168 hours. Cardiac Enzymes: No results for input(s): CKTOTAL, CKMB, CKMBINDEX, TROPONINI in the last 168 hours. BNP (last 3 results) Recent Labs    07/13/23 1408  PROBNP 6,812*   HbA1C: No results for input(s): HGBA1C in the last 72 hours. CBG: No results  for input(s): GLUCAP in the last 168 hours. Lipid Profile: No results for input(s): CHOL, HDL, LDLCALC, TRIG, CHOLHDL, LDLDIRECT in the last 72 hours. Thyroid  Function Tests: No results for input(s): TSH, T4TOTAL, FREET4, T3FREE, THYROIDAB in the last 72 hours. Anemia Panel: No results for input(s): VITAMINB12, FOLATE, FERRITIN, TIBC, IRON , RETICCTPCT in the last 72 hours. Urine analysis:    Component Value Date/Time   COLORURINE YELLOW 03/25/2021 0551   APPEARANCEUR CLEAR 03/25/2021 0551   LABSPEC 1.015 03/25/2021 0551   PHURINE 6.0 03/25/2021 0551   GLUCOSEU NEGATIVE 03/25/2021 0551   HGBUR SMALL (A) 03/25/2021 0551   BILIRUBINUR NEGATIVE 03/25/2021 0551   KETONESUR NEGATIVE 03/25/2021 0551   PROTEINUR TRACE (A) 03/25/2021 0551   UROBILINOGEN 0.2 08/14/2010 1245   NITRITE NEGATIVE 03/25/2021 0551   LEUKOCYTESUR NEGATIVE 03/25/2021 0551   Sepsis Labs: @LABRCNTIP (procalcitonin:4,lacticidven:4)  )No results found for this or any previous visit (from the past 240 hours).   Radiology Studies: DG Chest Portable 1 View Result Date: 07/28/2023 CLINICAL DATA:  Shortness of breath. EXAM: PORTABLE CHEST 1 VIEW COMPARISON:  03/09/2023. FINDINGS: Stable cardiomegaly. Stable left chest wall pacemaker. Small bilateral pleural effusions with bibasilar atelectasis. Bilateral interstitial prominence, most pronounced at the lung bases, suspicious for interstitial pulmonary edema. No pneumothorax. Diffuse osseous demineralization. No acute osseous abnormality. IMPRESSION: 1. Small bilateral pleural effusions with bibasilar atelectasis. 2. Cardiomegaly with findings suggestive of interstitial pulmonary edema. Electronically Signed   By: Harrietta Sherry M.D.   On: 07/28/2023 13:35     Scheduled Meds:  amiodarone   100 mg Oral Daily   apixaban   2.5 mg Oral BID   carvedilol   25 mg Oral BID WC   furosemide   80 mg Intravenous BID   pantoprazole   40 mg Oral Daily    Continuous Infusions:   LOS: 1 day    Time spent:    Sigurd Pac, MD [facility]   07/29/2023, 9:34 AM

## 2023-07-29 NOTE — Plan of Care (Signed)
  Problem: Clinical Measurements: Goal: Ability to maintain clinical measurements within normal limits will improve Outcome: Progressing   Problem: Clinical Measurements: Goal: Will remain free from infection Outcome: Progressing   Problem: Clinical Measurements: Goal: Cardiovascular complication will be avoided Outcome: Progressing   Problem: Education: Goal: Knowledge of General Education information will improve Description: Including pain rating scale, medication(s)/side effects and non-pharmacologic comfort measures Outcome: Progressing   Problem: Nutrition: Goal: Adequate nutrition will be maintained Outcome: Progressing   Problem: Safety: Goal: Ability to remain free from injury will improve Outcome: Progressing

## 2023-07-29 NOTE — Progress Notes (Signed)
 Spoke with patient's daughter [name] Sharps at [phone]. She is currently gathering her mother's toiletries/clothing to take to daughter's home . Pt will be staying with daughter. [name] stated she will not be at the home until 430-500pm and is to call us  when she is there so we can transport patient to her home.

## 2023-07-29 NOTE — Progress Notes (Signed)
 Pt was brought to the home by EMT and brought into the home via wheelchair without incident. Pt used the bedside commode once arriving at home and was assisted to the recliner chair via walker and 1 person assist. Pt sat down in the recliner without incident. Pt is on home O2 @ 2lpm at baseline. Pt denied any SOB while moving. Pt was on supplemental O2 throughout transport.   Physical Ax found Pt alert to her baseline and tired. Pt denied any pain or increase of SOB. Eye conjunctiva is noted to be red, denied any pain. Chest expansion is equal with respirations. L/S are c/e in all fields. Pt denied any dysuria or hematuria. Increased urination due to lasix . Lymphedema noted to left arm, at baseline her arm is swollen however family stated that the arm is more swollen than usual. Wound bandage noted to left elbow, family stated that there is a small wound there but mostly it is on for cushioning of arm when she puts it up. Bilateral +1 pitting pedal edema noted. Bilateral pedal pulses are present. Weeping wound x1 on right shin. Weeping wound x2 on lateral left shin. All vitals were obtained and documented appropriately. Weight was documented.   Home Ax found no hazards to be noted. All walkways are clear from clutter. Pt is staying in a recliner with chux pad placed and family provided the family with a water resistant blanket that is also placed on the chair. Bedside commode is available for use and able to be placed easily.   All equipment was placed in the living room and medications were placed next to the tablet. The H@H  binder was explained to the family and Pt. All questions were answered and the phone number was displayed for all. Family denied having any other questions.   Pt and family denied having any other questions and reminded to call the H[facility]H  virtual RN if they need anything.

## 2023-07-29 NOTE — Plan of Care (Signed)
 Nutrition Education Note  RD consulted for nutrition education regarding acute on chronic heart failure.  RD provided Low Sodium Nutrition Therapy handout from the [employer]. Provided examples on ways to decrease sodium intake in diet. Discouraged intake of processed foods and use of salt shaker. Encouraged fresh or frozen fruits and vegetables as well as whole grain sources of carbohydrates to maximize fiber intake.   RD discussed why it is important for patient to adhere to diet recommendations, and emphasized the role of fluids, foods to avoid, and importance of weighing self daily. Teach back method used.  Expect good compliance.  Body mass index is 24.4 kg/m. BMI within goal range.   Current diet order is 2g Na+menu, regular texture, thin liquids. She has consumed approximately 95% of meals at this time. Labs and medications reviewed. No further nutrition interventions warranted at this time. RD contact information provided. If additional nutrition issues arise, please re-consult RD.   Brant Peets Daml-Budig, RDN, LDN Registered Dietitian Nutritionist RD Inpatient Contact Info in [place]

## 2023-07-29 NOTE — Progress Notes (Signed)
 1900-Introductory contact call and assessment completed. Patient SPO2 reading 78-80% per current health not alerting. Condition assessed Daughter confirmed concentrator was moved and unsure if O2 is flowing properly. Daughter/caregiver informed of best practice and safety to reduce 57ft O2 tubing to support better flow and prevention of possible escalation back to the hospital.   1945-Patient not showing any signs of distress during video call. Patient and daughter/caregiver confirmed unsure if O2 was supporting patient since moving the equipment. Outreach to current health representative via message for troubleshooting support. Per Current health representative connection is working, armband may need to be repositioned.   2000-2018- contact call. Daughter confirmed use of portable home pulse Ox reading at 93% at 1900 with another reading at 2018 reading 93%. Caregiver reported daughter was there and confirmed reading above 90%. Daughter made aware [facility] paramedic would be arriving to reassess patient O2 therapy. APP, Paramedic, and [facility] leader aware of change in SPO2 readings via current health and plan.   2039- Paramedic arrived to patient home O2 equipment and current health data reassessed. RN on contact call with paramedic. Patient stable, armband was repositioned several times. Size also adjusted, patient can only have wearable on right arm. Wearable is as snug as it can get for the size of the patient's arm. Patient has been reading 93% on portable pulse oximeter during contact call with paramedic. Patient remained stable and will continue to be monitored.   2122-SPO2 per Current Health 93%  2213- Contact call to patient and caregiver/daughter to review plan for the night. Patient still remains stable, currently in bed, denies pain, shortness of breath or medical concerns. RN [facility] contact information and plan for when to seek emergency services reviewed.

## 2023-07-29 NOTE — Progress Notes (Signed)
 Per current health, Pt's SPO2 was reading in the 70s and family was concerned that the O2 was not reaching the Pt.   Arrived to find the Pt sitting in the recliner, no obvious distress noted. Oxygen  tubing is at an adequate length and concentrator is plugged into a wall.   Pt's SPO2 reading with H@H  equipment was 92%. Due to O2 tubing being longer than norml, O2 was increased from 3lpm to 4lpm. Pt stated that she can feel the O2 blowing in her nose. After the increase in O2, Pt's SPO2 was reading at 94-95%. Current health wearable was moved from the side on his arm and reading wasn't accurate. Had to move wearable to the inside of the bicep and started to get an accurate reading. While there, PM medications were administered PO.   While leaving the house, RN Veal contacted [employer] and stated that the wearable was then reading in the mid 80s. During the visit continuous SPO2 reading was done and never went below 94%. Pt was showing no signs of distress and skin was appropriate color. RN and medic agreed that due to the Pt's arm size being small and mostly bone, the wearable was not reading accurate. Pt was left alone and family was told if there were any other issues to alert the RN.

## 2023-07-29 NOTE — Plan of Care (Addendum)
 Progress Note    Allison Norman   FMW:[id number]  DOB: September 16, 1924  DOA: 07/28/2023     1 Date of Service: 07/29/2023    Interval History:  89/F with history of persistent atrial fibrillation, chronic diastolic CHF, CKD 3B/4, breast cancer, chronic resp failure on 2L.  Presented to the ED with worsening dyspnea and edema. Patient was noted to have been seen by her primary cardiologist on 7/17 for persistent LE edema. Diuretic dose was continued at torsemide  40mg  daily with some ? Medication compliance at home. Recommendations also included fluid restriction.  Unclear about sodium intake at home. Pt lives at home though has consistent caregivers as needed.  - In the ER,  afebrile, hemodynamcally stable. O2 sats stable on 2L. A-fib largely rate controlled, proBNP 6812, troponin 26, 28, creatinine 2.09, GFR 21 Chest x-ray noted small pleural effusions and interstitial edema. As a part of hospital home prescreening, palliative care was formally consulted.  Patient as well as family.  In discussion with palliative care per report, family is agreeable to trial of Lasix  with tentative plan for home hospice if no clinical improvement versus outpatient palliative care. Subjective:  Mild improvement in shortness of breath at present though with persistent lower extremity as well as left upper extremity swelling.  Appears to be fairly chronic. Urine output 750 cc thus far. Active family discussion with palliative care.  Overall family including patient, daughters as well as granddaughters are agreeable to hospital at  home program.  Overall plan will be for trial of IV lasix  over next 2-3 days. If no improvement, family to consider home hospice vs. Outpatient palliative care. Consent signed the patient's granddaughter [place] Norman who works as a Cytogeneticist  The family also wishes for patient to stay at daughters house [place] Norman until home repairs can be made over next 2-3 days.    Hospital  Problems Assessment and Plan: Acute on chronic diastolic CHF -2D ECHO 03/2022 w/ EF 65070% and grade 2 diastolic dysfunction -Noted persistent mild SOB and LE swelling with some concern for medical and sodium compliance by outpatient cardiology  - She has hypoalbuminemia as well contributing to third spacing - Plan to continue IV Lasix  80 Mg twice daily likely 1 to 2 days, transition to torsemide  at discharge considering  - Palliative care consulted and following, continue conservative management for now, consider hospice when she declines further - GDMT limited by CKD 4 - Conservative management on account of advanced age frailty and multiple medical problems - Discussed CODE STATUS with the patient this morning, she is agreeable to DNR  Chronic Respiratory Failure:  On 2L [place] chronically -Stable in setting of acute on chronic HFpEF  -No overt infection noted on imaging  -Monitor with treatment   -Persistent atrial fibrillation -Prior history of cardioversions but appears to have been in persistent A-fib for years now - Continue amiodarone , carvedilol  and Eliquis  -Rate controlled at present  -Follow    CKD 4 -Cr 2 today  - Creatinine relatively stable at baseline of 1.6-2 - GFR is 21, monitor with diuresis - Hold offending medications    History of breast cancer Noted  Palliative Care Discussion:  Active family discussion with palliative care.  Overall family including patient, daughters as well as granddaughters are agreeable to hospital at  home program.  Overall plan will be for trial of IV lasix  over next 2-3 days. If no improvement, family to consider home hospice vs. Outpatient palliative care. Consent signed the patient's granddaughter  [place] Norman who works as a Marketing executive Vital signs were reviewed and unremarkable. Vitals:   07/29/23 0057 07/29/23 0520 07/29/23 0910 07/29/23 1154  BP: 120/74 131/88 99/65 120/69  Pulse: (!) 49 (!) 101 98   Temp: (!) 97.5  F (36.4 C) (!) 97.4 F (36.3 C) 98 F (36.7 C) 98 F (36.7 C)  Resp: 17 19 18 19   Height:      Weight:  66.5 kg    SpO2: 94% 94% 91% 97%  TempSrc: Oral Oral Oral Oral  BMI (Calculated):  24.4      Exam Physical Exam Constitutional:      Appearance: She is normal weight.  HENT:     Head: Normocephalic and atraumatic.     Mouth/Throat:     Mouth: Mucous membranes are moist.  Eyes:     Pupils: Pupils are equal, round, and reactive to light.  Cardiovascular:     Rate and Rhythm: Normal rate and regular rhythm.  Pulmonary:     Effort: Pulmonary effort is normal.  Abdominal:     General: Bowel sounds are normal.  Musculoskeletal:     Right lower leg: Edema present.     Left lower leg: Edema present.     Comments: + mild LUE swelling  Neurological:     Comments: Minimal to mild generalized confusion  Otherwise grossly nonfocal neuro exam    Psychiatric:        Mood and Affect: Mood normal.      Labs / Other Information There are no new results to review at this time. DG Chest Portable 1 View CLINICAL DATA:  Shortness of breath.  EXAM: PORTABLE CHEST 1 VIEW  COMPARISON:  03/09/2023.  FINDINGS: Stable cardiomegaly. Stable left chest wall pacemaker. Small bilateral pleural effusions with bibasilar atelectasis. Bilateral interstitial prominence, most pronounced at the lung bases, suspicious for interstitial pulmonary edema. No pneumothorax. Diffuse osseous demineralization. No acute osseous abnormality.  IMPRESSION: 1. Small bilateral pleural effusions with bibasilar atelectasis. 2. Cardiomegaly with findings suggestive of interstitial pulmonary edema.  Electronically Signed   By: Harrietta Sherry M.D.   On: 07/28/2023 13:35  Lab Results  Component Value Date   WBC 4.7 07/28/2023   HGB 10.9 (L) 07/28/2023   HCT 37.2 07/28/2023   MCV 91.6 07/28/2023   PLT 155 07/28/2023   Last metabolic panel Lab Results  Component Value Date   GLUCOSE 108 (H)  07/28/2023   NA 138 07/28/2023   K 3.8 07/28/2023   CL 100 07/28/2023   CO2 28 07/28/2023   BUN 41 (H) 07/28/2023   CREATININE 2.04 (H) 07/28/2023   GFRNONAA 22 (L) 07/28/2023   CALCIUM  8.0 (L) 07/28/2023   PHOS 3.7 03/25/2021   PROT 6.3 (L) 07/28/2023   ALBUMIN 2.7 (L) 07/28/2023   LABGLOB 3.0 08/12/2021   AGRATIO 1.3 08/12/2021   BILITOT 1.1 07/28/2023   ALKPHOS 99 07/28/2023   AST 23 07/28/2023   ALT 15 07/28/2023   ANIONGAP 10 07/28/2023      Time spent: 60+ minutes  [facility] 07/29/2023, 1:07 PM  Hospital at Home Admission Criteria Checklist:  Formal consent explained in detail and signed at the bedside:yes Patient meets inpatient admission criteria (see below for further details)  Is pt medicare FFS( required for initial launch with plan to expand)? yes Lives within 25 mil/ 30 min from [facility] within [place](pt may stay with family member during admission who lives within 25 miles or 30 min from  [facility]) ? yes  Hemodynamically stable with relatively low risk of clinical deterioration-not requiring ICU? yes Age >55?yes Does not require frequent touch-points or complex interventions/medications (ie Titrated Infusions (IV insulin, heparin  drips, vasoactive drips, use of infused or injectable controlled substances, patients on insulin)?  Any Behavioral Health comorbidities likely to increase risk for in-home care (ie Acute delirium or experiencing a marked altered mental status and cause is not a treatable condition in the home)? no  Has the patient been on BIPAP during course of ED evaluation or hospitalization?no IF YES, Has the patient been off of BIPAP for >24 hours(If NO-THEN PATIENT DOES MEET INCLUSION CRITERIA)?n/a Needs oxygen  at home (<6L)? 2L [place] chronically  Active safety concerns (ie Unable to use bedside commode independently and lacks caregiver support for safety- needs SNF placement, unable to obtain IV access)? No  Has skin check  been performed?yes  Has Physical Therapy screened the patient?n/a Common admission diagnoses including: CAP, COPD Exacerbation, Acute on chronic heart failure, Cellulitis, UTI , dehydration, acute resp failure with hypoxia (requiring <5L)   Social Screening: Denies significant ETOH intake? No  Does not smoke and understands may not smoke in the presence of oxygen ? yes  Patient states able to use iPad/phone for communication/has family who is able to use? yes  Patient has agreed to be compliant with medication and treatment regimen of the program?yes Any active drug use in patient or primary caregiver including daily dosing of methadone?  no Stable home environment ( access to appropriate heating in cold conditions and/or appropriate air conditioning in hot conditions and/or no running water/electricity)? yes  No aggressive pets at home? no Firearm present  with ability or willingness to store them unloaded in a locked case for duration of hospitalization? None reported  Ambulatory? Partially  (independently cane, walker  wheelchair bound, bed bound) Bed bugs present on home evaluation?no  Family support system in place? yes  Patient feels safe at home does not endorse any violence? yes Any actively decompensated behavioral healthy issues including agitation/aggressive behavior? no   Patient requests food to be provided by hospital home program?unsure PT/OT eval completed and not requiring SNF, ALF, inpatient rehab? no   To be admitted to the Hospital at [facility] program, a patient generally must meet the following: 1. Requirement for Inpatient Level of Care: The patient's condition must necessitate an inpatient level of care. This is typically indicated by one or more of the following, depending on their specific diagnosis: -Persistent tachycardia despite appropriate treatment (e.g., for Heart Failure, UTI). - Persistent tachypnea (rapid breathing) or dyspnea (shortness of breath) that  hasn't improved sufficiently with observation care (e.g., for Heart Failure, Pneumonia, Viral Illness, COVID). - Hypoxemia (low oxygen  levels), such as a new need for oxygen , an increased need from baseline, or specific oxygen  saturation levels (e.g., SpO2 <90-94% depending on the condition) that persist despite observation (e.g., for Heart Failure, COPD, Pneumonia, Viral Illness, COVID). - Need for Intravenous (IV) hydration due to an inability to maintain oral hydration, which persists despite observation care (e.g., for Cellulitis, UTI, Viral Illness, COVID). - Specific to Heart Failure: Persistent pulmonary edema, indicated by a new oxygen  need, lack of improvement with IV diuretics, and ongoing tachypnea/dyspnea. - Specific to COPD: A decrease in known baseline resting oxygen  saturation (SpO2) by 4% or more, or an increase in pre-existing supplemental oxygen  requirements, which persists despite observation and requires continued close monitoring. - Specific to Pneumonia: A Pneumonia Severity Index (PSI) class IV (moderate  risk). - Specific to Cellulitis: Failure of outpatient antibiotic therapy (indicated by progression or no improvement after a minimum of 48 hours on an adequate regimen) or a clinical presentation (like acuity or rapidity of progression) that requires the intensity of monitoring found in an inpatient setting. - Specific to UTI: Persistence or worsening of clinical findings like fever, pain, or dehydration despite observation care; presence of significant uropathy; suspected infection of an indwelling prosthetic device, stent, implant, or graft; or pregnancy with suspected pyelonephritis. 2. Appropriateness for Hospital at Home Setting: The patient's overall clinical picture, including the severity of their illness, their care needs, and their medical history and comorbidities, must be suitable for management in the Hospital at Home environment. This essentially  means that none of the exclusion criteria (listed below) are met.  Unified Exclusion Criteria for Hospital at Home Admission: A patient would not be eligible for Hospital at Home if any of the following are present: - Hemodynamic Instability: - Hypotension (low blood pressure) is present. - Respiratory Instability or Needs Beyond Program Capability: - There is a new need for invasive or noninvasive ventilatory assistance (like BiPAP or a ventilator). - Oxygenation is not sufficient, generally indicated if an FiO2 (fraction of inspired oxygen ) of 45% (which is about 6 Liters/minute via nasal cannula) or more is required to keep oxygen  saturation (SpO2) at 90% or greater. - Monitoring or Procedural Needs Beyond Program Capability: - There is a need for invasive monitoring, such as a pulmonary artery catheter or an arterial line. - There is a need for immediate-response telemetry monitoring (for dangerous arrhythmia detection and subsequent immediate intervention). - The required medication regimen is beyond the capabilities of Hospital at Home (e.g., dosing intervals are too frequent for home administration). - There is a need for a procedure that cannot be performed by the Hospital at [facility] team (e.g., significant wound debridement or abscess drainage for cellulitis, or percutaneous nephrostomy for a complicated UTI). - Significant Organ Dysfunction or Markers of Severe Illness: - Mental status is not at baseline, or there is altered mental status suggestive of inadequate perfusion. - Renal (kidney) function is unstable or showing an ongoing decline. - There is evidence of inadequate perfusion, such as metabolic acidosis or myocardial ischemia. - Uncompensated acidosis is present. - Condition-Specific Severity or Complications Making Home Care Unsuitable: -For Heart Failure: Known severe cardiac valvular disease (e.g., aortic stenosis, mitral regurgitation); or severe peripheral edema  that impairs the ability to urinate or ambulate. -For COPD: Known concurrent comorbidity or finding that indicates a higher-risk COPD exacerbation (e.g., pulmonary fibrosis, cavitation, pleural effusion, pneumothorax, rib fracture). -For Pneumonia: Pneumonia Severity Index (PSI) class V (indicating high risk for inpatient mortality); known concurrent comorbidity or finding that indicates higher-risk pneumonia (e.g., pulmonary fibrosis, cavitation, large or loculated pleural effusion); or a concomitant serious infectious process like endocarditis or empyema. -For Cellulitis: Orbital, periorbital, or necrotizing infection is suspected; or a concomitant serious infectious process like endocarditis, septic emboli, or septic joint space infection. -For UTI: Urinary tract obstruction (e.g., kidney stone, bladder outlet obstruction); or a concomitant serious infectious process like endocarditis or septic emboli. -For Viral Illness & COVID-19: A concomitant serious infectious process like endocarditis or empyema. General Comorbidities or Status: -The patient is significantly immunosuppressed (this applies to Pneumonia, Cellulitis, UTI, Viral Illness, and COVID-19). -The patient meets inpatient admission criteria for a second diagnosis, or has care needs beyond the capabilities of Hospital at Home due to an active clinically significant comorbidity. (This is a general  exclusion across all listed conditions)

## 2023-07-29 NOTE — Progress Notes (Signed)
 Pt's family had requested dose of Melatonin 10mg . Dr. Fausto was notified.

## 2023-07-29 NOTE — Consult Note (Signed)
 Palliative Medicine Inpatient Consult Note  Consulting Provider:  Eldonna Elspeth PARAS, MD   Reason for consult:   Palliative Care Consult Services Palliative Medicine Consult  Reason for Consult? goals of care- hospital at home pre-screen   07/29/2023  HPI:  Per intake H&P --> Allison Norman is a 89 y.o. female with medical history significant of Afib s/p DCCV in 2017 with re-occurrence in 2018; now rate controlled, HTN, CKD3b, and breast/skin cancer p/w HFpEF exacerbation and AKI. The Palliative care team has been asked to support additional goals of care conversations.   Clinical Assessment/Goals of Care:  *Please note that this is a verbal dictation therefore any spelling or grammatical errors are due to the Dragon Medical One system interpretation.  I have reviewed medical records including EPIC notes, labs and imaging, received report from bedside RN, assessed the patient who is sitting in the recliner chair in NAD.    I met with Brae, her granddaughter, [name], and daughter on Speakerphone to further discuss diagnosis prognosis, GOC, EOL wishes, disposition and options.   I introduced Palliative Medicine as specialized medical care for people living with serious illness. It focuses on providing relief from the symptoms and stress of a serious illness. The goal is to improve quality of life for both the patient and the family.  Medical History Review and Understanding:  A review of Allison Norman's past medical history significant for atrial fibrillation, hypertension, chronic kidney disease, heart failure, and breast cancer was completed  Social History:  [place] Norman shares that she is from [place]  originally though moved to [place] in the setting of her husband's job.  She shares she was married to her husband for 73 years.  She has 2 daughters, 6 grandchildren, 75 great-grandchildren, and 1 great great grandchild.  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  Allison Norman gets joy out of  watching [place]  Norman football games and spending time with family.  Allison Norman is a woman of faith practicing within the [employer] denomination.  Functional and Nutritional State:  Allison Norman in the home is able to care for herself and perform basic activities of living.  She has caregivers throughout the majority of the day.  Her appetite as of recently has been so-so.  Advance Directives:  A detailed discussion was had today regarding advanced directives.  [name] surrogate decision makers are her daughters.  Code Status:  Encouraged patient/family to consider DNR/DNI status understanding evidenced based poor outcomes in similar hospitalized patient, as the cause of arrest is likely associated with advanced chronic/terminal illness rather than an easily reversible acute cardio-pulmonary event. I explained that DNR/DNI does not change the medical plan and it only comes into effect after a person has arrested (died).  It is a protective measure to keep us  from harming the patient in their last moments of life.  [name] was agreeable to DNR/DNI with understanding that patient would not receive CPR, defibrillation, ACLS medications, or intubation.   I completed a MOST form today. The patient and family outlined their wishes for the following treatment decisions:  Cardiopulmonary Resuscitation: Do Not Attempt Resuscitation (DNR/No CPR)  Medical Interventions: Limited Additional Interventions: Use medical treatment, IV fluids and cardiac monitoring as indicated, DO NOT USE intubation or mechanical ventilation. May consider use of less invasive airway support such as BiPAP or CPAP. Also provide comfort measures. Transfer to the hospital if indicated. Avoid intensive care.   Antibiotics: Antibiotics if indicated  IV Fluids: IV fluids if indicated  Feeding Tube: No feeding tube  Discussion:  Sahily shares with me she came to the hospital begrudgingly so in the setting of feeling more weak and debilitated  from the perspective of her breathing.  Her granddaughter shares at home she was desaturating to 85%.  Allison Norman had been consistently going to and from the doctor's office for checkups most notably her cardiologist for management and titration of diuretic medications.  To date Allison Norman shares nothing has really helped.  Allison Norman shares knowledge of her heart failure and the effect this has on her kidneys.  She is understanding of her disease severity.   We reviewed what Allison Norman's wishes are at this point in time she shares that she has spoken to the cardiology and primary care team.  She would like to continue with the trial of diuretics to see if they are successful at alleviating her symptom burden and volume overload.  Alternatively if these measures are not successful we did broach the topic of hospice care.  I described hospice as a service for patients who have a life expectancy of 6 months or less. The goal of hospice is the preservation of dignity and quality at the end phases of life. Under hospice care, the focus changes from curative to symptom relief.   We reviewed in the meanwhile having outpatient palliative care follow along patient's granddaughter, daughter and the patient are going to determine which agency they would like to elect.  We also discussed that Allison Norman is being considered for the hospital at home program for which they will be informed further of from the program physician.  Allison Norman would like to get home as soon as possible she notes that she has her 89th birthday coming up in 2 days.  Discussed the importance of continued conversation with family and their  medical providers regarding overall plan of care and treatment options, ensuring decisions are within the context of the patients values and GOCs.  Decision Maker: Allison Norman (Daughter): [phone] (Home Phone)   SUMMARY OF RECOMMENDATIONS   DNAR/DNI  MOST/DNR Form Completed, paper copy placed onto the chart  electric copy can be found in [employer]  Open and honest conversations held in the setting of patient's advanced heart failure and what to do if diuresis is not effective  Discussions of palliative care versus hospice care were held --> patient's family plan to determine an outpatient palliative company they would like to work with  Patient would like to go home with the hospital at home program if deemed a candidate  The palliative medicine team will remain to follow along as needed  Code Status/Advance Care Planning: DNAR/DNI  Palliative Prophylaxis:  Aspiration, Bowel Regimen, Delirium Protocol, Frequent Pain Assessment, Oral Care, Palliative Wound Care, and Turn Reposition  Additional Recommendations (Limitations, Scope, Preferences): Continue present care  Psycho-social/Spiritual:  Desire for further Chaplaincy support: Patient is Methodist Additional Recommendations: Education on heart failure and chronic disease burden associated with this   Prognosis: Unclear at this time will depend upon patient's tolerance of diuretics  Discharge Planning: Discharge plan to home once medically stable  Vitals:   07/29/23 0057 07/29/23 0520  BP: 120/74 131/88  Pulse: (!) 49 (!) 101  Resp: 17 19  Temp: (!) 97.5 F (36.4 C) (!) 97.4 F (36.3 C)  SpO2: 94% 94%    Intake/Output Summary (Last 24 hours) at 07/29/2023 0741 Last data filed at 07/29/2023 0520 Gross per 24 hour  Intake --  Output 750 ml  Net -750 ml   Last Weight  Most recent update:  07/29/2023  5:40 AM    Weight  66.5 kg (146 lb 9.7 oz)            Gen: Elderly Caucasian female chronically ill-appearing HEENT: moist mucous membranes CV: Regular rate and irregular rhythm PULM: On 2 L nasal cannula breathing is even and nonlabored ABD: soft/nontender EXT: Left upper extremity edema Neuro: Alert and oriented x3  PPS: 50%   This conversation/these recommendations were discussed with patient primary care team, Dr.  Fairy ______________________________________________________ Rosaline Becton [facility] Palliative Medicine Team Team Cell Phone: [phone] Please utilize secure chat with additional questions, if there is no response within 30 minutes please call the above phone number  Total Time: 75 Billing based on MDM: High  Palliative Medicine Team providers are available by phone from 7am to 7pm daily and can be reached through the team cell phone.  Should this patient require assistance outside of these hours, please call the patient's attending physician.

## 2023-07-29 NOTE — Evaluation (Signed)
 Physical Therapy Evaluation Patient Details Name: Allison Norman MRN: [id number] DOB: [birth year] Today's Date: 07/29/2023  History of Present Illness  Pt is 89 year old presented to [facility] on  07/28/23 for acute on chronic CHF. Pt to trial IV lasix  for several days and is going to participate in hospital at home program. PMH - afib, chf, ckd, breast CA, chronic resp failure on 2L, pacer  Clinical Impression  Pt admitted with above diagnosis and presents to PT with functional limitations due to deficits listed below (See PT problem list). Pt needs skilled PT to maximize independence and safety. Typically pt is using rollator to mobilize and has hired caregivers. Pt plans to take part in hospital at home program and will be going to her daughter's home. Recommend continued PT with hospital at home program and then HHPT after dc from that program.          If plan is discharge home, recommend the following: A little help with walking and/or transfers;A little help with bathing/dressing/bathroom;Assistance with cooking/housework;Assist for transportation   Can travel by private vehicle        Equipment Recommendations None recommended by PT  Recommendations for Other Services       Functional Status Assessment Patient has had a recent decline in their functional status and/or demonstrates limited ability to make significant improvements in function in a reasonable and predictable amount of time     Precautions / Restrictions Precautions Precautions: Fall Recall of Precautions/Restrictions: Intact Restrictions Weight Bearing Restrictions Per Provider Order: No      Mobility  Bed Mobility               General bed mobility comments: Pt up in chair    Transfers Overall transfer level: Needs assistance Equipment used: Rolling walker (2 wheels), Rollator (4 wheels) Transfers: Sit to/from Stand, Bed to chair/wheelchair/BSC Sit to Stand: Min assist   Step pivot transfers: Min  assist       General transfer comment: Assist to power up. Chair to [facility] with assist for balance.    Ambulation/Gait Ambulation/Gait assistance: Contact guard assist, Min assist Gait Distance (Feet): 80 Feet Assistive device: Rollator (4 wheels), Rolling walker (2 wheels) Gait Pattern/deviations: Step-through pattern, Decreased stride length, Trunk flexed Gait velocity: decr Gait velocity interpretation: <1.8 ft/sec, indicate of risk for recurrent falls   General Gait Details: Assist for balance which incr as pt fatigued. Used rollator and then rolling walker. Pt with incr stability using rolling walker.  Stairs            Wheelchair Mobility     Tilt Bed    Modified Rankin (Stroke Patients Only)       Balance Overall balance assessment: Needs assistance Sitting-balance support: No upper extremity supported, Feet supported Sitting balance-Leahy Scale: Good     Standing balance support: Bilateral upper extremity supported, During functional activity, Reliant on assistive device for balance Standing balance-Leahy Scale: Poor Standing balance comment: UE support and CGA for static standing                             Pertinent Vitals/Pain Pain Assessment Pain Assessment: No/denies pain    Home Living Family/patient expects to be discharged to:: Private residence Living Arrangements: Alone (has caregivers and will be going to daughters home at dc.) Available Help at Discharge: Family;Personal care attendant;Available 24 hours/day Type of Home: House Home Access: Level entry  Home Layout: One level Home Equipment: Agricultural consultant (2 wheels);Rollator (4 wheels);Shower seat;BSC/3in1;Transport chair      Prior Function Prior Level of Function : Needs assist;History of Falls (last six months)       Physical Assist : ADLs (physical)   ADLs (physical): IADLs Mobility Comments: Mod I with rollator       Extremity/Trunk Assessment   Upper  Extremity Assessment Upper Extremity Assessment: Generalized weakness    Lower Extremity Assessment Lower Extremity Assessment: Generalized weakness       Communication   Communication Communication: Impaired Factors Affecting Communication: Hearing impaired    Cognition Arousal: Alert Behavior During Therapy: WFL for tasks assessed/performed   PT - Cognitive impairments: No apparent impairments                         Following commands: Intact       Cueing Cueing Techniques: Verbal cues, Tactile cues     General Comments General comments (skin integrity, edema, etc.): SpO285-86% on 2L O2 with amb. Incr O2 to 3L for amb    Exercises     Assessment/Plan    PT Assessment Patient needs continued PT services  PT Problem List Decreased strength;Decreased activity tolerance;Decreased balance;Decreased mobility       PT Treatment Interventions DME instruction;Gait training;Functional mobility training;Therapeutic activities;Therapeutic exercise;Balance training;Patient/family education    PT Goals (Current goals can be found in the Care Plan section)  Acute Rehab PT Goals Patient Stated Goal: Go home. PT Goal Formulation: With patient Time For Goal Achievement: 08/12/23 Potential to Achieve Goals: Good    Frequency Min 2X/week     Co-evaluation               AM-PAC PT 6 Clicks Mobility  Outcome Measure Help needed turning from your back to your side while in a flat bed without using bedrails?: A Little Help needed moving from lying on your back to sitting on the side of a flat bed without using bedrails?: A Little Help needed moving to and from a bed to a chair (including a wheelchair)?: A Little Help needed standing up from a chair using your arms (e.g., wheelchair or bedside chair)?: A Little Help needed to walk in hospital room?: A Little Help needed climbing 3-5 steps with a railing? : Total 6 Click Score: 16    End of Session  Equipment Utilized During Treatment: Gait belt;Oxygen  Activity Tolerance: Patient limited by fatigue Patient left: in chair;with call bell/phone within reach;with family/visitor present   PT Visit Diagnosis: Unsteadiness on feet (R26.81);Other abnormalities of gait and mobility (R26.89);Repeated falls (R29.6);Muscle weakness (generalized) (M62.81)    Time: 8596-8566 PT Time Calculation (min) (ACUTE ONLY): 30 min   Charges:   PT Evaluation $PT Eval Moderate Complexity: 1 Mod PT Treatments $Gait Training: 8-22 mins PT General Charges $$ ACUTE PT VISIT: 1 Visit         [facility] PT Acute Rehabilitation Services Office [phone]   Rodgers ORN [facility] 07/29/2023, 3:08 PM

## 2023-07-30 LAB — BASIC METABOLIC PANEL WITH GFR
Anion gap: 9 (ref 5–15)
BUN: 37 mg/dL — ABNORMAL HIGH (ref 8–23)
CO2: 31 mmol/L (ref 22–32)
Calcium: 7.9 mg/dL — ABNORMAL LOW (ref 8.9–10.3)
Chloride: 98 mmol/L (ref 98–111)
Creatinine, Ser: 1.95 mg/dL — ABNORMAL HIGH (ref 0.44–1.00)
GFR, Estimated: 23 mL/min — ABNORMAL LOW (ref >60)
Glucose, Bld: 174 mg/dL — ABNORMAL HIGH (ref 70–99)
Potassium: 4.1 mmol/L (ref 3.5–5.1)
Sodium: 138 mmol/L (ref 135–145)

## 2023-07-30 LAB — CBC
HCT: 35.4 % — ABNORMAL LOW (ref 36.0–46.0)
Hemoglobin: 10.4 g/dL — ABNORMAL LOW (ref 12.0–15.0)
MCH: 26.3 pg (ref 26.0–34.0)
MCHC: 29.4 g/dL — ABNORMAL LOW (ref 30.0–36.0)
MCV: 89.6 fL (ref 80.0–100.0)
Platelets: 146 K/uL — ABNORMAL LOW (ref 150–400)
RBC: 3.95 MIL/uL (ref 3.87–5.11)
RDW: 21.4 % — ABNORMAL HIGH (ref 11.5–15.5)
WBC: 4 K/uL (ref 4.0–10.5)
nRBC: 0 % (ref 0.0–0.2)

## 2023-07-30 MED ORDER — FLUTICASONE PROPIONATE 50 MCG/ACT NA SUSP
2.0000 | Freq: Every day | NASAL | Status: DC
  Administered 2023-07-31 – 2023-08-01 (×2): 2 via NASAL
  Filled 2023-07-30: qty 16

## 2023-07-30 MED ORDER — FUROSEMIDE 10 MG/ML IJ SOLN
40.0000 mg | Freq: Two times a day (BID) | INTRAMUSCULAR | Status: AC
  Administered 2023-07-30: 40 mg via INTRAVENOUS
  Filled 2023-07-30 (×2): qty 4

## 2023-07-30 MED ORDER — ACETAMINOPHEN 325 MG PO TABS
650.0000 mg | ORAL_TABLET | Freq: Four times a day (QID) | ORAL | Status: DC | PRN
  Administered 2023-07-30 (×2): 650 mg via ORAL
  Filled 2023-07-30 (×3): qty 2

## 2023-07-30 MED ORDER — MIDODRINE HCL 5 MG PO TABS
5.0000 mg | ORAL_TABLET | Freq: Two times a day (BID) | ORAL | Status: DC | PRN
  Filled 2023-07-30: qty 1

## 2023-07-30 MED ORDER — DICLOFENAC SODIUM 1 % EX GEL
2.0000 g | Freq: Four times a day (QID) | CUTANEOUS | Status: DC
  Administered 2023-07-30 – 2023-08-03 (×9): 2 g via TOPICAL
  Filled 2023-07-30: qty 100

## 2023-07-30 MED ORDER — CARVEDILOL 6.25 MG PO TABS
6.2500 mg | ORAL_TABLET | Freq: Two times a day (BID) | ORAL | Status: DC
  Administered 2023-07-30 – 2023-08-04 (×10): 6.25 mg via ORAL
  Filled 2023-07-30 (×14): qty 1

## 2023-07-30 NOTE — Progress Notes (Signed)
 2322- Current Health Data Alert: Tachypnea for Respiratory Rate: Patient and caregiver reached via video chat both confirmed patient is stable, denies SOB  CP distress or need for emergent care. Caregiver confirmed skin under wearable is dry and patient just used the bathroom and got in bed. Current health data is still not reading properly. Caregiver used patient's portable pulse ox, which produced a reading of 96% SPO2 and 80HR and confirmed as accurate reading.  Patient will continue to be monitored.

## 2023-07-30 NOTE — Progress Notes (Addendum)
 Heart Failure Navigator Progress Note  Assessed for Heart & Vascular TOC clinic readiness.  Patient does not meet criteria due to discharge  to [facility] and possible palliative vs hospice if no improvement in a few days, EF  Last 65-70% Palliative consult - may transition to hospice if declines further, No HF TOC , Has a [facility] appointment on 10/19/2023.   Navigator will sign off at this time.   Stephane Haddock, BSN, Scientist, clinical (histocompatibility and immunogenetics) Only

## 2023-07-30 NOTE — Progress Notes (Addendum)
 Current Health Data:  Resp rate 20 rpm  Jul 24, 4:01am  SPO2 95 %  Jul 24, 3:57am    0619--Hypoxia Alert per Current Health data- Patient/daughter called. Daughter confirmed SPO2 reading of 94% with home portable pulse ox, not in the 80s. Daughter reported patient was in another room with caregiver most of the night, patient had some coughing throughout the night but stable and showed no signs of respiratory distress or chest pain.

## 2023-07-30 NOTE — Progress Notes (Signed)
 1922- RN introduction, partial assessment, and self-administration medication plan, and [facility] contact information reviewed. Patient preferred and agreed to a video call for medications at 2230. Patient identifiers completed, patient alert and oriented, denied pain, respiratory, and cardiac concerns at time of call. Current health data is being captured. Patient encouraged to call RN if needed before upcoming video call; patient will continue to be monitored.   SPO2 92%   Resp 21  Pulse 75 per Current Health data

## 2023-07-30 NOTE — Progress Notes (Addendum)
 Progress Note   Patient: Allison Allison Norman FMW:[id number] DOB: [birth year] DOA: 07/28/2023     2 DOS: the patient was seen and examined on 07/30/2023    Patient identified herself as Allison Allison Norman DOB [birth year] Paramedic Allison Norman present at bedside during encounter and performed the physical exam. Patient was seen today via video chat; my physical location [place], [place]   Brief hospital course:  89 yr old F with history of persistent atrial fibrillation, chronic diastolic CHF, CKD 3B/4, breast cancer s/p L mastectomy, chronic resp failure on 2 L/min.  Presented to the ED with worsening dyspnea and edema. Patient was noted to have been seen by her primary cardiologist on 7/17 for persistent LE edema. Diuretic dose was continued at torsemide  40mg  daily with some ? Medication compliance at home. Recommendations also included fluid restriction.  Unclear about sodium intake at home. Pt lives at home though has consistent caregivers as needed.    In the ER, pt afebrile, hemodynamcally stable. O2 sats stable on 2L. A-fib largely rate controlled, proBNP 6812, troponin 26, 28, creatinine 2.09, GFR 21 Chest x-ray noted small pleural effusions and interstitial edema.  As a part of hospital home prescreening, palliative care was formally consulted.  Patient as well as family.  In discussion with palliative care, family were agreeable to trial of diuresis with IV Lasix  with tentative plan for home hospice if no clinical improvement versus outpatient palliative care once medically optimized.  Patient was admitted to Hospital at [facility] program on 07/29/2023.  Further hospital course and management as outlined below.   Assessment and Plan:  Acute on chronic diastolic CHF -2D ECHO 03/2022 w/ EF 65-70% and grade 2 diastolic dysfunction Noted persistent mild SOB and LE swelling with some concern for medical and sodium compliance by outpatient cardiology  Hypoalbuminemia also contributing to third spacing edema -  Hold AM dose of IV Lasix  today, given BP soft - Will reduce IV Lasix  from 80 >> 40 mg BID given soft BP's today, resume with afternoon dose if BP allows - Reduced Coreg  from 25 >> 6.25 mg BID to get BP room for diuresis - Add PRN midodrine  5 mg BID PRN if BP before IV Lasix  is < 110/60 - Monitor daily weights, edema for improvement - Palliative care consulted and following, continue conservative management for now, considering hospice when she declines further - GDMT limited by CKD 4 - Conservative management given advanced age frailty and multiple medical problems -CODE STATUS was discussed with the patient on AM of 7/23, she is agreeable to DNR/DNI    Acute on Chronic Respiratory Failure: On 2L [place] chronically Oxygen  needs currently 4 L/min with O2 sats in low/mid 90's, dyspnea due to CHF decompensation.  --Continue diuresis as above --Supplement O2 for goal spO2 > 90%, wean as tolerated   -Persistent atrial fibrillation -- HR's controlled Prior history of cardioversions but appears to have been in persistent A-fib for years now --Continue amiodarone , carvedilol  and Eliquis    CKD stage 4 -- Baseline Cr 1.77 on 07/13/23 (range 1.6--2 typically). Cr on admission 2.09 >> 2.04 >> improving with diuresis 1.95 today --Monitor closely with diuresis   Left elbow pain, Left upper extremity pain/swelling - suspect from edema CHF with underlying history of left mastectomy limiting lymphatic flow.  No history of gout. Pt is on Eliquis , making DVT less likely --Voltaren  gel and Tylenol  PRN --Trial of ice and elevation --Diuresis as above --Will consider LUE doppler U/S to definitively rule out DVT if pt/family  wish -- for now, U/S has been declined after discussion with family today  History of breast cancer status post Left mastectomy Noted      Subjective: Pt seen virtually with Medic present in the home, family also present, this AM.  Pt had reported to RN and Medic that she feels worse  today, with increased work on breathing.  When I ask patient how she's feeling today, she reports better and states feeling fine.  Pt has left elbow pain she reports comes and goes when she's in the hospital.  Later, family report this seems more an acute issue.  Family also note patient intermittently confused since coming to the hospital. For example, granddaughter visited her yesterday before transfer to [facility] and pt did not initially recognize who she was.    Pt expressing frustration about her usual medications always being changed by doctors in the hospital.  Pt reports leg swelling feels better, on exam is similar to yesterday per medic report.  Physical Exam: Vitals:   07/29/23 1745 07/29/23 1901 07/29/23 2159 07/30/23 1000  BP:    98/63  Pulse:      Resp:      Temp:      TempSrc:      SpO2:  93% 95%   Weight: 64.9 kg     Height:       General exam: awake, alert, no acute distress HEENT: moist mucus membranes, hearing grossly normal  Respiratory system: CTAB with fine crackles at posterior bases, no expiratory wheezes, normal respiratory effort at rest on 4 L/min [place] O2. Cardiovascular system: normal S1/S2, RRR, 1+ pitting BLE edema Gastrointestinal system: soft, NT, ND, no HSM felt, +bowel sounds. Central nervous system:  no gross focal neurologic deficits, normal speech Extremities: 1+ BLE edema, b/l feet discoloration/cyanosis, left elbow tenderness dependent edema of LUE noted Skin: dry, intact, normal temperature Psychiatry: normal mood, congruent affect, judgement and insight appear normal   Data Reviewed:  Notable labs -- glucose 174, BUN 37, Cr 2.04 >> 1.95, Ca 7.9, Hbg stable 10.4  Family Communication: family were present during virtual encounter, and I spoke with granddaughter again by phone this afternoon   Disposition: Status is: Inpatient Remains inpatient appropriate because: requires further IV diuresis and clinical improvement   Planned Discharge  Destination: Home     Time spent: 55 minutes including time during virtual encounter and in coordination of care  Author: Burnard DELENA Cunning, DO 07/30/2023 2:12 PM  For on call review [email or web address].

## 2023-07-30 NOTE — Progress Notes (Addendum)
 Pt seen for afternoon routine Hath visit. Pt was using the bathroom with assistance of in home aide upon arrival. Pt was able to ambulate to and from bathroom using her walker and minimal assistance from her caregiver. Pt's granddaughter is present and states Pt looks a lot stronger today.  Pt c/o increased swelling to left arm and hand and reports a sore pain in her elbow. Pt has mepilex dressing on elbow placed yesterday to help pad the area in hopes that would decrease the pain. Dressing removed, no wound present. Swelling to the left elbow noted. Photos taken of left upper extremity and uploaded to the media tab in epic. Voltaren  gel applied as noted in [facility] and tylenol  administered for pain as noted. Pt's ted hose readjusted to smooth out any wrinkles. There is a small area that appears to have been weeping on left lateral lower leg. This area appears to be a small scab that may have gotten rubbed off/ disturbed by ted hose.  Some dried blood noted at IV catheter site but area is not inflamed and does not appear infiltrated. Pt's granddaughter states she believes the dried blood was present yesterday as well. Pt denies any pain to IV site and same flushes well. Pt also states she doesn't want to be called at 6 am for meds tomorrow morning. Pt's granddaughter stated earlier that pt will be transported back to her own home. Pt's granddaughter made aware [facility] will have to transport pt tomorrow. Pt's granddaughter agrees and states she will call with a time tomorrow.

## 2023-07-30 NOTE — Discharge Planning (Signed)
 RNCM consulted regarding pt and family requesting BioButton (a medical-grade wearable device, about the size of a silver dollar, that continuously monitors vital signs and biometric) for home use.  RNCM reached out to [employer], [employer] rep who states they can provide for pt by mid week next week if pt is still interested.  Dejaun Vidrio J. Debarah, BSN, RN, [facility]  IP Care Management  Nurse Case Manager  [facility] Emergency Departments  Operative Services  [phone]

## 2023-07-30 NOTE — Progress Notes (Signed)
 Called patient related to hypoxia alarms and current health parameters not populating. Current health and providers aware of interrupted data transmission related to patients moist skin (CHF). Patient reports feeling tired and wanted to rest refuse coreg  will take later.  Hypoxic alarm at 5 - daughter and patient report patient very tired. Spoke to daughter reports O2 - on home monitor 96%, HR 88. Denies pain. Sent message to provider for flonase  nasal spray patient request.

## 2023-07-30 NOTE — Discharge Planning (Signed)
 Transition of Care [facility]) - Inpatient Brief Assessment   Patient Details  Name: Allison Norman MRN: [id number] Date of Birth: 13-May-1924  [facility] CM/SW Contact:    Debarah Saunas, RN Phone Number: 07/30/2023, 8:50 AM   Clinical Narrative: 89 yo female presented with worsening dyspnea and edema.  Admitted to Hospital at [facility] 07/29/2023.  Discharge plan is Home with Palliative.  Inpatient Care Management will continue to follow for additional needs.   Transition of Care Asessment: Insurance and Status: Insurance coverage has been reviewed Patient has primary care physician: Yes Home environment has been reviewed: from home with 24 hour caretakers Prior level of function:: home DME oxygen  ([employer]), walker rollator, Bedside commode Prior/Current Home Services: Current home services (private care nurses) Social Drivers of Health Review: SDOH reviewed no interventions necessary Readmission risk has been reviewed: Yes Transition of care needs: transition of care needs identified, TOC will continue to follow

## 2023-07-30 NOTE — Progress Notes (Signed)
 Patient virtual encounter with provider, medic, and Tax inspector. Beta Blocker held BP at 98/63, RR 16, HR 75. Provider educated holding am Lasix  as well, labs being pulled to check renal function before continued diuresis. Speaking to daughter it was suggested to put on TED hose monitor for increased difficulty breathing and remove TED hose.Plan to improve elbow pain as the patient has Hx mastectomy on left side has intermittent left elbow swelling. Orders in for voltaren  gel, flonase  nasal spray, and midodrine  to help with BP. Daughter is planning to have mother move to her home tomorrow afternoon if her repairs in the bathroom have been complete. Notifying provider.

## 2023-07-30 NOTE — Plan of Care (Signed)
  Problem: Education: Goal: Knowledge of General Education information will improve Description: Including pain rating scale, medication(s)/side effects and non-pharmacologic comfort measures Outcome: Progressing   Problem: Health Behavior/Discharge Planning: Goal: Ability to manage health-related needs will improve Outcome: Progressing   Problem: Clinical Measurements: Goal: Ability to maintain clinical measurements within normal limits will improve Outcome: Progressing Goal: Will remain free from infection Outcome: Progressing Goal: Cardiovascular complication will be avoided Outcome: Progressing   Problem: Nutrition: Goal: Adequate nutrition will be maintained Outcome: Progressing   Problem: Elimination: Goal: Will not experience complications related to bowel motility Outcome: Progressing   Problem: Pain Managment: Goal: General experience of comfort will improve and/or be controlled Outcome: Progressing   Problem: Safety: Goal: Ability to remain free from injury will improve Outcome: Progressing

## 2023-07-30 NOTE — Progress Notes (Signed)
 2221--Contact to patient to inform a call will be coming from the tablet for medication administration support.  Patient reached.   2222- Patient contact via Video call. Patient identifiers review  patient sitting up in chair with feet elevated. Medication administration assistance and remaining assessment completed. Caregiver with patient, provided patient assistance for medication administration. Patient denies the need for additional support at this time. Overnight monitoring plan and [facility] contact information reviewed. When to call RN vs 911 for emergency needs and s/sx of change in condition, plan for AM medications reviewed. Patient will continue to be monitored.

## 2023-07-31 LAB — BASIC METABOLIC PANEL WITH GFR
Anion gap: 8 (ref 5–15)
BUN: 36 mg/dL — ABNORMAL HIGH (ref 8–23)
CO2: 32 mmol/L (ref 22–32)
Calcium: 8.1 mg/dL — ABNORMAL LOW (ref 8.9–10.3)
Chloride: 98 mmol/L (ref 98–111)
Creatinine, Ser: 1.94 mg/dL — ABNORMAL HIGH (ref 0.44–1.00)
GFR, Estimated: 23 mL/min — ABNORMAL LOW
Glucose, Bld: 85 mg/dL (ref 70–99)
Potassium: 4.3 mmol/L (ref 3.5–5.1)
Sodium: 138 mmol/L (ref 135–145)

## 2023-07-31 MED ORDER — FUROSEMIDE 10 MG/ML IJ SOLN
40.0000 mg | Freq: Two times a day (BID) | INTRAMUSCULAR | Status: DC
  Administered 2023-07-31 (×2): 40 mg via INTRAVENOUS
  Filled 2023-07-31 (×4): qty 4

## 2023-07-31 NOTE — Plan of Care (Signed)
   Problem: Education: Goal: Knowledge of General Education information will improve Description Including pain rating scale, medication(s)/side effects and non-pharmacologic comfort measures Outcome: Progressing

## 2023-07-31 NOTE — TOC Progression Note (Signed)
 Transition of Care [facility]) - Progression Note    Patient Details  Name: Allison Norman MRN: [id number] Date of Birth: 1924-02-12  [facility]) CM/SW Contact  Debarah Saunas, RN Phone Number: 07/31/2023, 11:27 AM  Clinical Narrative:    RNCM spoke with pt via telephone regarding setting up hospital follow up appointment.  Pt states she prefers the earliest appointment available.  RNCM obtained appointment on August 13, 2023  at 10:30 with  Dr Okey and placed on After Visit Summary paperwork.        Expected Discharge Plan: Home/Self Care (24 hour private care) Barriers to Discharge: Continued Medical Work up               Expected Discharge Plan and Services In-house Referral: Hospice / Palliative Care Discharge Planning Services: CM Consult, Follow-up appt scheduled   Living arrangements for the past 2 months: Single Family Home                   DME Agency: NA       HH Arranged: NA           Social Drivers of Health (SDOH) Interventions SDOH Screenings   Food Insecurity: No Food Insecurity (07/29/2023)  Housing: Low Risk  (07/29/2023)  Transportation Needs: No Transportation Needs (07/29/2023)  Utilities: Not At Risk (07/29/2023)  Social Connections: Socially Isolated (07/29/2023)  Tobacco Use: Medium Risk (07/29/2023)    Readmission Risk Interventions    07/31/2023   11:26 AM 07/29/2023    2:54 PM 07/29/2023   10:12 AM  Readmission Risk Prevention Plan  Transportation Screening Complete Complete   PCP or Specialist Appt within 5-7 Days Complete    Home Care Screening Complete Complete Complete  Medication Review (RN CM) Complete Complete Complete

## 2023-07-31 NOTE — Progress Notes (Signed)
 Virtual visit with patient, her daughter [name] and caregiver at bedside. Paramedic Sarah at bedside to assist with assessment. Pt's medications reviewed,answered questions, PT also joined and performed assessment, patient was able to perform a few exercises and tolerated well. Confirmed evening paramedic visit for 6 pm/ Pt requests to take all her evening meds at that time and to receive no routine calls after 9pm.

## 2023-07-31 NOTE — Progress Notes (Signed)
 Pt appears well at evening visit but says she is tired from birthday celebration with family today. Pt assisted to be weighed and was slightly unsteady on scale, weight charted may not be perfectly accurate. Pt's LUE/ hand looked much less edematous than this morning. Meds given and IV flushed and assessed.  Pt and family request transport tomorrow morning at 10:30am, back to pt's own home. I will plan on arriving at current location (Pt daughter's home) at 10:30 am, obtaining v/s and administering morning meds, transporting, setting up in pt's home and having provider visit in pt's home at that point. [facility] team made aware of same.

## 2023-07-31 NOTE — Progress Notes (Signed)
 Progress Note   Patient: Allison Norman FMW:[id number] DOB: 09/28/1924 DOA: 07/28/2023     3 DOS: the patient was seen and examined on 07/31/2023    Patient identified herself as Allison Norman DOB January 15, 1924 Paramedic Sarah present at bedside during encounter and performed the physical exam. Patient was seen today via video chat; my physical location [place], [place]   Brief hospital course:  89 yr old F with history of persistent atrial fibrillation, chronic diastolic CHF, CKD 3B/4, breast cancer s/p L mastectomy, chronic resp failure on 2 L/min.  Presented to the ED with worsening dyspnea and edema. Patient was noted to have been seen by her primary cardiologist on 7/17 for persistent LE edema. Diuretic dose was continued at torsemide  40mg  daily with some ? Medication compliance at home. Recommendations also included fluid restriction.  Unclear about sodium intake at home. Pt lives at home though has consistent caregivers as needed.    In the ER, pt afebrile, hemodynamcally stable. O2 sats stable on 2L. A-fib largely rate controlled, proBNP 6812, troponin 26, 28, creatinine 2.09, GFR 21 Chest x-ray noted small pleural effusions and interstitial edema.  As a part of hospital home prescreening, palliative care was formally consulted.  Patient as well as family.  In discussion with palliative care, family were agreeable to trial of diuresis with IV Lasix  with tentative plan for home hospice if no clinical improvement versus outpatient palliative care once medically optimized.  Patient was admitted to [facility] program on 07/29/2023.  Further hospital course and management as outlined below.   Assessment and Plan:  Acute on chronic diastolic CHF -2D ECHO 03/2022 w/ EF 65-70% and grade 2 diastolic dysfunction Noted persistent mild SOB and LE swelling with some concern for medical and sodium compliance by outpatient cardiology  Hypoalbuminemia also contributing to third spacing  edema --Continue diuresis with Lasix  40 mg IV BID --Continue reduced Coreg  from 25 >> 6.25 mg BID (to give BP extra room for diuresis) - If soft BP before Lasix  --  PRN midodrine  5 mg BID PRN if BP before IV Lasix  is < 110/60 - Monitor daily weights, edema for improvement - Palliative care consulted and following, continue conservative management for now, considering hospice if declining further - GDMT limited by CKD 4 - Conservative management given advanced age frailty and multiple medical problems -CODE STATUS was discussed with the patient on AM of 7/23, she is agreeable to DNR/DNI    Acute on Chronic Respiratory Failure: On 2L [place] chronically Oxygen  needs currently 4 L/min with O2 sats in low/mid 90's, dyspnea due to CHF decompensation.  --Continue diuresis as above --Supplement O2 for goal spO2 > 90%, wean as tolerated   -Persistent atrial fibrillation -- HR's controlled Prior history of cardioversions but appears to have been in persistent A-fib for years now --Continue amiodarone , carvedilol  and Eliquis    CKD stage 4 -- Baseline Cr 1.77 on 07/13/23 (range 1.6--2 typically). Cr on admission 2.09 >> 2.04 >> slightly improving  Cr 1.95 >> 1.94 today --Monitor closely with diuresis   Left elbow pain, Left upper extremity pain/swelling - suspect from edema CHF with underlying history of left mastectomy limiting lymphatic flow.  No history of gout. Pt is on Eliquis , making DVT less likely --Voltaren  gel and Tylenol  PRN --Trial of ice and elevation --Diuresis as above --Will consider LUE doppler U/S to definitively rule out DVT if pt/family wish -- for now, U/S has been declined after discussion with family today  History of  breast cancer status post Left mastectomy Noted      Subjective: Pt seen virtually with Medic in the home this  morning.  Pt reports feeling about the same today.  Denies shortness of breath.  Leg swelling is slowly improving.  Her left elbow is still tender  but improved since yesterday somewhat with Voltaren  and Tylenol .  Pt celebrating her 72 birthday with family today.  She denies other new issues or other concerns.   Physical Exam: Vitals:   07/30/23 2322 07/31/23 0900 07/31/23 1012 07/31/23 1400  BP:  (!) 144/76    Pulse:  88 94   Resp:  20    Temp:  (!) 97.4 F (36.3 C)    TempSrc:  Oral    SpO2: 96% 94% 94% 92%  Weight:      Height:       General exam: awake, alert, no acute distress HEENT: moist mucus membranes, hearing grossly normal  Respiratory system: CTAB, no expiratory wheezes, normal respiratory effort at rest on 4 L/min [place] O2. No accessory muscle use seen or conversational dyspnea noted Cardiovascular system: normal S1/S2, RRR, trace+ BLE edema (improved) Gastrointestinal system: soft, NT, ND, no HSM felt, +bowel sounds. Central nervous system:  no gross focal neurologic deficits, normal speech Extremities: left hand and upper extremity edema, tenderness over left olecrannon without warmth Psychiatry: normal mood, congruent affect, judgement and insight appear normal   Data Reviewed:  Notable labs -- glucose 174, BUN 37, Cr 2.04 >> 1.95, Ca 7.9, Hbg stable 10.4  Family Communication: family were present during virtual encounter, and I spoke with granddaughter again by phone this afternoon   Disposition: Status is: Inpatient Remains inpatient appropriate because: requires further IV diuresis and clinical improvement   Planned Discharge Destination: Home     Time spent: 42 minutes  Author: Burnard DELENA Cunning, DO 07/31/2023 3:59 PM  For on call review [email or web address].

## 2023-07-31 NOTE — Progress Notes (Signed)
 Spoke with patient's daughter [name] via phone. RN was informed pt is still sleeping. Communicated to the daughter paramedic visit will be between 9 and 10. Voiced agreement.

## 2023-07-31 NOTE — Plan of Care (Signed)
  Problem: Skin Integrity: Goal: Risk for impaired skin integrity will decrease 07/31/2023 1834 by Jonne Slough, RN Outcome: Progressing   Problem: Safety: Goal: Ability to remain free from injury will improve 07/31/2023 1834 by Jonne Slough, RN Outcome: Progressing   Problem: Skin Integrity: Goal: Risk for impaired skin integrity will decrease 07/31/2023 1834 by Jonne Slough, RN Outcome: Progressing 07/31/2023 1834 by Jonne Slough, RN Outcome: Progressing Education: Add All Knowledge of General Education information will improve Add Today at 1834 - Progressing by Jonne Slough, RN Add  Today at [phone] - Progressing by Jonne Slough, RN Add Health Behavior/Discharge Planning: Add All Ability to manage health-related needs will improve Add Today at 1834 - Progressing by Jonne Slough, RN Add  Today at 1834 - Progressing by Jonne Slough, RN Add Clinical Measurements: Add All Ability to maintain clinical measurements within normal limits will improve Add Today at 1834 - Progressing by Jonne Slough, RN Add  Today at [phone] - Progressing by Jonne Slough, RN Add Will remain free from infection Add Today at 1834 - Progressing by Jonne Slough, RN Add  Today at [phone] - Progressing by Jonne Slough, RN Add Diagnostic test results will improve Add Today at 1834 - Progressing by Jonne Slough, RN Add  Today at [phone] - Progressing by Jonne Slough, RN Add Respiratory complications will improve Add Today at 1834 - Progressing by Jonne Slough, RN Add  Today at [phone] - Progressing by Jonne Slough, RN Add Cardiovascular complication will be avoided Add Today at 1834 - Progressing by Jonne Slough, RN Add  Today at [phone] - Progressing by Jonne Slough, RN Add Activity: Add All Risk for activity intolerance will decrease Add Today at 1834 - Progressing by Jonne Slough, RN Add  Today at [phone] - Progressing by Jonne Slough,  RN Add Nutrition: Add All Adequate nutrition will be maintained Add Today at 1834 - Progressing by Jonne Slough, RN Add  Today at [phone] - Progressing by Jonne Slough, RN Add Coping: Add All Level of anxiety will decrease Add Today at 1834 - Progressing by Jonne Slough, RN Add  Today at [phone] - Progressing by Jonne Slough, RN Add Elimination: Add All Will not experience complications related to bowel motility Add Today at 1834 - Progressing by Jonne Slough, RN Add  Today at [phone] - Progressing by Jonne Slough, RN Add Will not experience complications related to urinary retention Add Today at 1834 - Progressing by Jonne Slough, RN Add  Today at [phone] - Progressing by Jonne Slough, RN Add Pain Managment: Add All General experience of comfort will improve and/or be controlled Add Today at 1834 - Progressing by Jonne Slough, RN Add  Today at [phone] - Progressing by Jonne Slough, RN Add Safety: Add All Ability to remain free from injury will improve Add Today at 1834 - Progressing by Jonne Slough, RN Add  Today at [phone] - Progressing by Jonne Slough, RN Add Skin Integrity: Add All Risk for impaired skin integrity will decrease Add Today at 1834 - Progressing by Jonne Slough, RN Add  Today at [phone] - Progressing by B

## 2023-07-31 NOTE — Evaluation (Signed)
 Physical Therapy Evaluation Patient Details Name: Allison Norman MRN: [id number] DOB: 03/27/1924 Today's Date: 07/31/2023  History of Present Illness  Pt is 89 year old presented to [facility] on  07/28/23 for acute on chronic CHF. Pt to trial IV lasix  for several days and is going to participate in hospital at home program. PMH - afib, chf, ckd, breast CA, chronic resp failure on 2L, pacer  Clinical Impression  Virtual evaluation performed for patient in the Hospital at [facility] program. Paramedic present in the home during evaluation. Pt reports feeling tired this morning after recently waking up, declines ambulation but is able to transfer with support of RW. Pt has support of caregivers 24/7 and plans to return to her home tomorrow. The pt is encouraged to continue mobilizing as tolerated within the home, with frequent bouts encouraged to maintain or progress activity tolerance. HHPT recommended at time of discharge form the hospital at home program.        If plan is discharge home, recommend the following: A little help with walking and/or transfers;A little help with bathing/dressing/bathroom;Assistance with cooking/housework;Assist for transportation   Can travel by private vehicle        Equipment Recommendations None recommended by PT  Recommendations for Other Services       Functional Status Assessment Patient has had a recent decline in their functional status and/or demonstrates limited ability to make significant improvements in function in a reasonable and predictable amount of time     Precautions / Restrictions Precautions Precautions: Fall Recall of Precautions/Restrictions: Intact Restrictions Weight Bearing Restrictions Per Provider Order: No      Mobility  Bed Mobility               General bed mobility comments: pt up in chair    Transfers Overall transfer level: Needs assistance Equipment used: Rolling walker (2 wheels) Transfers: Sit to/from Stand Sit  to Stand: Supervision           General transfer comment: pt performs 5 sit to stands during evaluation, 3 consecutive sit to stands to end evaluation    Ambulation/Gait             Pre-gait activities: pt marches in place with support of RW for 5 steps with each leg. pt declines ambulation, reports still feeling tired after recently waking up    Stairs            Wheelchair Mobility     Tilt Bed    Modified Rankin (Stroke Patients Only)       Balance Overall balance assessment: Needs assistance Sitting-balance support: No upper extremity supported, Feet supported Sitting balance-Leahy Scale: Fair     Standing balance support: Bilateral upper extremity supported, Reliant on assistive device for balance Standing balance-Leahy Scale: Poor                               Pertinent Vitals/Pain Pain Assessment Pain Assessment: No/denies pain    Home Living Family/patient expects to be discharged to:: Private residence Living Arrangements: Alone Available Help at Discharge: Family;Personal care attendant;Available 24 hours/day (pt is currently at her daughter's home, plans to transition to her home tomorrow) Type of Home: House Home Access: Level entry       Home Layout: One level Home Equipment: Agricultural consultant (2 wheels);Rollator (4 wheels);Shower seat;BSC/3in1;Transport chair Additional Comments: pt reports she will have 24/7 caregiver assistance    Prior Function Prior Level of  Function : Needs assist;History of Falls (last six months)             Mobility Comments: Mod I with rollator ADLs Comments: patient has caregivers that assist with cleaning the home. She reports she is independent with bathing and dressing but could have assistance if needed     Extremity/Trunk Assessment   Upper Extremity Assessment Upper Extremity Assessment: Generalized weakness    Lower Extremity Assessment Lower Extremity Assessment: Generalized  weakness    Cervical / Trunk Assessment Cervical / Trunk Assessment: Kyphotic  Communication   Communication Communication: No apparent difficulties    Cognition Arousal: Alert Behavior During Therapy: WFL for tasks assessed/performed   PT - Cognitive impairments: No apparent impairments                         Following commands: Intact       Cueing Cueing Techniques: Verbal cues     General Comments General comments (skin integrity, edema, etc.): Paramedic reports pt's oxygen  sturation at 97% on 4L [place], HR 94    Exercises     Assessment/Plan    PT Assessment Patient needs continued PT services  PT Problem List Decreased activity tolerance;Decreased strength;Cardiopulmonary status limiting activity       PT Treatment Interventions DME instruction;Gait training;Stair training;Functional mobility training;Therapeutic activities;Therapeutic exercise;Balance training;Neuromuscular re-education;Patient/family education    PT Goals (Current goals can be found in the Care Plan section)  Acute Rehab PT Goals Patient Stated Goal: to improve activity tolerance PT Goal Formulation: With patient Time For Goal Achievement: 08/14/23 Potential to Achieve Goals: Fair    Frequency Min 1X/week     Co-evaluation               AM-PAC PT 6 Clicks Mobility  Outcome Measure Help needed turning from your back to your side while in a flat bed without using bedrails?: A Little Help needed moving from lying on your back to sitting on the side of a flat bed without using bedrails?: A Little Help needed moving to and from a bed to a chair (including a wheelchair)?: A Little Help needed standing up from a chair using your arms (e.g., wheelchair or bedside chair)?: A Little Help needed to walk in hospital room?: A Little Help needed climbing 3-5 steps with a railing? : A Lot 6 Click Score: 17    End of Session Equipment Utilized During Treatment: Oxygen  Activity  Tolerance: Patient tolerated treatment well Patient left: in chair Nurse Communication: Mobility status PT Visit Diagnosis: Other abnormalities of gait and mobility (R26.89);Muscle weakness (generalized) (M62.81)    Time: 9043-8990 PT Time Calculation (min) (ACUTE ONLY): 13 min   Charges:   PT Evaluation $PT Eval Low Complexity: 1 Low   PT General Charges $$ ACUTE PT VISIT: 1 Visit         Bernardino JINNY Ruth, PT, DPT Acute Rehabilitation Office [phone]   Bernardino JINNY Ruth 07/31/2023, 11:01 AM

## 2023-07-31 NOTE — Progress Notes (Signed)
 Spoke with patient's caregiver via phone. Pt had just finished washing up and sats were 92% on 4 liters by the portable oxygen  saturation monitor. No needs at this time

## 2023-07-31 NOTE — Plan of Care (Addendum)
  Problem: Nutrition: Goal: Adequate nutrition will be maintained Outcome: Progressing   Problem: Elimination: Goal: Will not experience complications related to bowel motility Outcome: Progressing Goal: Will not experience complications related to urinary retention Outcome: Progressing   Problem: Pain Managment: Goal: General experience of comfort will improve and/or be controlled Outcome: Progressing   Problem: Safety: Goal: Ability to remain free from injury will improve Outcome: Progressing   Problem: Skin Integrity: Goal: Risk for impaired skin integrity will decrease Outcome: Progressing   Problem: Clinical Measurements: Goal: Ability to maintain clinical measurements within normal limits will improve Outcome: Progressing Goal: Will remain free from infection Outcome: Progressing Goal: Diagnostic test results will improve Outcome: Progressing Goal: Respiratory complications will improve Outcome: Progressing Goal: Cardiovascular complication will be avoided Outcome: Progressing

## 2023-07-31 NOTE — Progress Notes (Signed)
 Bedtime medication administration completed for patient with the assistance of patient's private caregiver. Patient with no complaints of pain tonight. Only concern was heartburn and RN explained to patient that she had already taken her daily Protonix  pill. Patient expressed that she would like to take it at night. Pulse ox not currently showing up in Current health. Oxygen  level with patient's pulse ox was 92% on 4l.

## 2023-07-31 NOTE — Progress Notes (Signed)
 Phone call made to patient. Spoke with patient's daughter, [name], who stated patient was currently on the phone getting Birthday wishes. They denied any needs at this time and stated that they would call RN back later this evening when ready to take her bedtime medications.

## 2023-07-31 NOTE — Progress Notes (Addendum)
 Arrived for [facility] routine visit. Pt is attempting to use the restroom when I arrived and required some assistance from me to transfer from wheelchair to toilet. Pt does not feel like walking and states she is still tired and just woke up. Pt refuses weight this morning and feels like she is unable to stand on the scale. We will attempt at 2nd visit.  Current Health [employer] main kit switched out for new one due to issues with o2 sats not picking up accurately.   Assessment and vital signs obtained as noted in flowsheets.   Pt requests plain oatmeal and EMT Candi was able to bring same to Pt after visit. Pt will be having birthday celebration around 3pm today and requests 2nd Paramedic visit at 6pm today. Pt will not be going back to her own home until tomorrow and family reminded we will arrange transport for that.  IMM letter and photo consent signed at 10:00 am. Copy of IMM letter left with pt and one brought to hub and placed in shadow chart.

## 2023-08-01 LAB — BASIC METABOLIC PANEL WITH GFR
Anion gap: 9 (ref 5–15)
BUN: 35 mg/dL — ABNORMAL HIGH (ref 8–23)
CO2: 32 mmol/L (ref 22–32)
Calcium: 8.1 mg/dL — ABNORMAL LOW (ref 8.9–10.3)
Chloride: 99 mmol/L (ref 98–111)
Creatinine, Ser: 1.76 mg/dL — ABNORMAL HIGH (ref 0.44–1.00)
GFR, Estimated: 26 mL/min — ABNORMAL LOW (ref >60)
Glucose, Bld: 115 mg/dL — ABNORMAL HIGH (ref 70–99)
Potassium: 4.2 mmol/L (ref 3.5–5.1)
Sodium: 140 mmol/L (ref 135–145)

## 2023-08-01 MED ORDER — PANTOPRAZOLE SODIUM 40 MG PO TBEC
40.0000 mg | DELAYED_RELEASE_TABLET | Freq: Every evening | ORAL | Status: DC
  Administered 2023-08-01 – 2023-08-04 (×4): 40 mg via ORAL
  Filled 2023-08-01 (×4): qty 1

## 2023-08-01 MED ORDER — BENZONATATE 100 MG PO CAPS
200.0000 mg | ORAL_CAPSULE | Freq: Three times a day (TID) | ORAL | Status: DC | PRN
  Administered 2023-08-02 (×2): 200 mg via ORAL
  Filled 2023-08-01 (×3): qty 2

## 2023-08-01 MED ORDER — CALCIUM CARBONATE ANTACID 500 MG PO CHEW
1.0000 | CHEWABLE_TABLET | Freq: Two times a day (BID) | ORAL | Status: DC | PRN
  Filled 2023-08-01: qty 1

## 2023-08-01 MED ORDER — ALUM & MAG HYDROXIDE-SIMETH 200-200-20 MG/5ML PO SUSP
15.0000 mL | Freq: Four times a day (QID) | ORAL | Status: DC | PRN
  Filled 2023-08-01: qty 30

## 2023-08-01 MED ORDER — FUROSEMIDE 10 MG/ML IJ SOLN
40.0000 mg | Freq: Two times a day (BID) | INTRAMUSCULAR | Status: DC
  Administered 2023-08-01 – 2023-08-02 (×3): 40 mg via INTRAVENOUS
  Filled 2023-08-01 (×5): qty 4

## 2023-08-01 NOTE — Plan of Care (Signed)
 Patient alert & oriented x3. Transferred to patient domicile from daughters home with round the clock agency caregiver staff. Appetite consistent. Was placed on trial of 3L O2 could not maintain O2 Sat >/= 92% patient is back on 4L.  Patient has skin break between breasts provider prescribed probiotic and nystatin powder.  Problem: Education: Goal: Knowledge of General Education information will improve Description: Including pain rating scale, medication(s)/side effects and non-pharmacologic comfort measures Outcome: Progressing   Problem: Clinical Measurements: Goal: Ability to maintain clinical measurements within normal limits will improve Outcome: Progressing Goal: Will remain free from infection Outcome: Progressing Goal: Respiratory complications will improve Outcome: Progressing Goal: Cardiovascular complication will be avoided Outcome: Progressing   Problem: Activity: Goal: Risk for activity intolerance will decrease Outcome: Progressing   Problem: Nutrition: Goal: Adequate nutrition will be maintained Outcome: Progressing Note: Pt eating consistently med-small amounts   Problem: Elimination: Goal: Will not experience complications related to bowel motility Outcome: Progressing   Problem: Pain Managment: Goal: General experience of comfort will improve and/or be controlled Outcome: Progressing Note: Transferred to her domicile    Problem: Safety: Goal: Ability to remain free from injury will improve Outcome: Progressing

## 2023-08-01 NOTE — Progress Notes (Signed)
 On virtual call with patient Identification completed self administered with agency caregiver Mathilda will be with patient next 3 days) morning medications well tolerated. Pt BP 104/64 taken by Carvedilol  held SBP < 105. Medic to retake BP. Patient ate english muffin w/ cream cheese.for breakfast.

## 2023-08-01 NOTE — Progress Notes (Signed)
 Patient's heart rate with missing data in current health and oxygen  level not currently showing up. Call placed to patient and her caregiver. Patient stable and O2 level 91% with heart rate at 92 using patient's pulse ox.

## 2023-08-01 NOTE — Progress Notes (Signed)
 Progress Note   Patient: Allison Norman FMW:[id number] DOB: Mar 16, 1924 DOA: 07/28/2023     4 DOS: the patient was seen and examined on 08/01/2023    Patient identified herself as Allison Norman DOB Aug 30, 1924 Paramedic Sarah present at bedside during encounter and performed the physical exam. Patient was seen today via video chat; my physical location [place], [place]   Brief hospital course:  89 yr old F with history of persistent atrial fibrillation, chronic diastolic CHF, CKD 3B/4, breast cancer s/p L mastectomy, chronic resp failure on 2 L/min.  Presented to the ED with worsening dyspnea and edema. Patient was noted to have been seen by her primary cardiologist on 7/17 for persistent LE edema. Diuretic dose was continued at torsemide  40mg  daily with some ? Medication compliance at home. Recommendations also included fluid restriction.  Unclear about sodium intake at home. Pt lives at home though has consistent caregivers as needed.    In the ER, pt afebrile, hemodynamcally stable. O2 sats stable on 2L. A-fib largely rate controlled, proBNP 6812, troponin 26, 28, creatinine 2.09, GFR 21 Chest x-ray noted small pleural effusions and interstitial edema.  As a part of hospital home prescreening, palliative care was formally consulted.  Patient as well as family.  In discussion with palliative care, family were agreeable to trial of diuresis with IV Lasix  with tentative plan for home hospice if no clinical improvement versus outpatient palliative care once medically optimized.  Patient was admitted to Hospital at [facility] program on 07/29/2023.  Further hospital course and management as outlined below.   Assessment and Plan:  Acute on chronic diastolic CHF -2D ECHO 03/2022 w/ EF 65-70% and grade 2 diastolic dysfunction Noted persistent mild SOB and LE swelling with some concern for medical and sodium compliance by outpatient cardiology  Hypoalbuminemia also contributing to third spacing  edema 7/26 -- weight trending down 66.5 kg >> 64.5 kg yesterday, edema slowly improving, O2 needs stable --Continue diuresis with Lasix  40 mg IV BID --Continue reduced Coreg  from 25 >> 6.25 mg BID (to give BP extra room for diuresis) (AM Coreg  held today for soft BP) - If soft BP before Lasix  --  PRN midodrine  5 mg BID PRN if BP before IV Lasix  is < 110/60 - Monitor daily weights, edema for improvement - GDMT limited by CKD 4  Goals of Care - Conservative management given advanced age frailty and multiple medical problems - Palliative care consulted prior to admission to [facility] program -- they will follow outpatient after d/c vs transition to hospice pending clinical course -CODE STATUS was discussed with the patient on AM of 7/23, she is agreeable to DNR/DNI    Acute on Chronic Respiratory Failure: On 2L [place] chronically Oxygen  needs currently 4 L/min with O2 sats in low/mid 90's, dyspnea due to CHF decompensation.  --Continue diuresis as above --Supplement O2 for goal spO2 > 90%, wean as tolerated   Persistent atrial fibrillation -- HR's controlled Prior history of cardioversions but appears to have been in persistent A-fib for years now --Continue amiodarone , carvedilol  and Eliquis  --Coreg  reduced 25 >> 6.25 mg due to soft BP's with diuresis --AM Coreg  dose was held today   CKD stage 4 -- Baseline Cr 1.77 on 07/13/23 (range 1.6--2 typically). Cr on admission 2.09 >> 2.04 >> slightly improving  Cr 1.95 >> 1.94 >> repeat for today is pending --Monitor closely with diuresis   Left elbow pain, Left upper extremity pain/swelling - suspect from edema CHF with underlying history of  left mastectomy limiting lymphatic flow.  No history of gout. Pt is on Eliquis , making DVT less likely --Voltaren  gel and Tylenol  PRN --Trial of ice and elevation --Diuresis as above --Discussed LUE doppler U/S to definitively rule out DVT after discussion family, will defer for now and monitor.  History of  breast cancer status post Left mastectomy Noted      Subjective: Pt seen virtually with Medic in the home this  morning.  Pt reports feeling about the same today.  Denies shortness of breath.  Leg swelling is slowly improving.  Her left elbow is still tender but improved since yesterday somewhat with Voltaren  and Tylenol .  Pt celebrating her 64 birthday with family today.  She denies other new issues or other concerns.   Physical Exam: Vitals:   08/01/23 0900 08/01/23 1033 08/01/23 1100 08/01/23 1120  BP:  104/64 116/71 116/71  Pulse:   88 88  Resp:   (!) 22   Temp:   (!) 97.3 F (36.3 C) (!) 97.3 F (36.3 C)  TempSrc:   Oral Oral  SpO2:   94% 94%  Weight: 109.9 kg     Height:       General exam: awake, alert, no acute distress HEENT: moist mucus membranes, hearing grossly normal  Respiratory system: CTAB, no expiratory wheezes, normal respiratory effort at rest on 4 L/min [place] O2. No accessory muscle use seen or conversational dyspnea noted Cardiovascular system: normal S1/S2, RRR, trace+ BLE edema (slowly improving) Gastrointestinal system: soft, NT, ND, no HSM felt, +bowel sounds. Central nervous system:  no gross focal neurologic deficits, normal speech Extremities: less left hand and upper extremity edema, ongoing tenderness over left olecrannon but no warmth or erythema Psychiatry: normal mood, congruent affect, judgement and insight appear normal   Data Reviewed:  Notable labs --   PENDING labs for today  Family Communication: family were present in the home during virtual encounter   Disposition: Status is: Inpatient Remains inpatient appropriate because: requires further IV diuresis and clinical improvement   Planned Discharge Destination: Home     Time spent: 38 minutes  Author: Burnard DELENA Cunning, DO 08/01/2023 1:51 PM  For on call review [email or web address].

## 2023-08-01 NOTE — Plan of Care (Signed)
  Problem: Education: Goal: Knowledge of General Education information will improve Description: Including pain rating scale, medication(s)/side effects and non-pharmacologic comfort measures Outcome: Progressing   Problem: Health Behavior/Discharge Planning: Goal: Ability to manage health-related needs will improve Outcome: Progressing   Problem: Clinical Measurements: Goal: Ability to maintain clinical measurements within normal limits will improve Outcome: Progressing Goal: Respiratory complications will improve Outcome: Progressing   Problem: Elimination: Goal: Will not experience complications related to bowel motility Outcome: Progressing   Problem: Pain Managment: Goal: General experience of comfort will improve and/or be controlled Outcome: Progressing   Problem: Safety: Goal: Ability to remain free from injury will improve Outcome: Progressing

## 2023-08-01 NOTE — Progress Notes (Signed)
 Pt seen for PM routine Hath visit. Meds administered. Pt assisted to check weight. Pt ambulated to bathroom with walker and caregiver assistance. Pt had a bm and states it was normal for her. Pt's caregiver will call virtual RN for bedtime meds and has been shown where they are.

## 2023-08-01 NOTE — Progress Notes (Signed)
 Call made to patient to introduce myself as the RN for the night. Patient answered and stated that she was doing fine and just has some slight discomfort in her elbow. Patient declined any prn medication at this time. Patient stated that she and her caregiver will call back later this evening when she was ready for her bedtime medications.

## 2023-08-01 NOTE — Progress Notes (Signed)
 Called patient 1345 to Check wearable not getting readings. Grand daughter assisted wearable was malpositioned once corrected patient O2 was at 88% we put  O2 back to 4L and sats increased to 92% on current health and 93% on patients pulse oximeter.Patient awake and speaking denies any distress or pain. Provider was notified.

## 2023-08-01 NOTE — TOC Progression Note (Addendum)
 Transition of Care [facility]) - Progression Note    Patient Details  Name: Allison Norman MRN: [id number] Date of Birth: 29-Nov-1924  [facility]) CM/SW Contact  Allison JAYSON Canary, RN Phone Number: 08/01/2023, 12:20 PM  Clinical Narrative:     Called patients  mobile number  (no answer )and daughters number ( left  confidential message) regarding discharge planning . PT assessment revealed  recommendation for home health. Allison Norman had serviced the patient until discharged in May. They are happy to service her again if they would like.  Will await call back to discuss  IP care management will follow for needs, recommendations, and transitions of care 1230 Spoke on the phone to Allison Norman, paitents daughter, who was there with the patient.  She agreed to have [facility] back for home health. No further DME needed at this time. She has a rollator and , a toilet liftThe paramedics were on location, so we stopped the call. She is aware of this RNCM number and can call back with any questions. Oxygen  is being monitored and weaned to get her back to 2L which is her baseline. IF she cannot wean, they are aware of  obtaining oxygen  qualifications.  1530 Had to increase oxygen  back to 4 LPM.  Will discuss patient in IDR at 1600 Will continue to follow   Expected Discharge Plan: Home/Self Care (24 hour private care) Barriers to Discharge: Continued Medical Work up               Expected Discharge Plan and Services In-house Referral: Hospice / Palliative Care Discharge Planning Services: CM Consult, Follow-up appt scheduled   Living arrangements for the past 2 months: Single Family Home                   DME Agency: NA       HH Arranged: NA           Social Drivers of Health (SDOH) Interventions SDOH Screenings   Food Insecurity: No Food Insecurity (07/29/2023)  Housing: Low Risk  (07/29/2023)  Transportation Needs: No Transportation Needs (07/29/2023)  Utilities: Not At Risk  (07/29/2023)  Social Connections: Socially Isolated (07/29/2023)  Tobacco Use: Medium Risk (07/29/2023)    Readmission Risk Interventions    07/31/2023   11:26 AM 07/29/2023    2:54 PM 07/29/2023   10:12 AM  Readmission Risk Prevention Plan  Transportation Screening Complete Complete   PCP or Specialist Appt within 5-7 Days Complete    Home Care Screening Complete Complete Complete  Medication Review (RN CM) Complete Complete Complete

## 2023-08-01 NOTE — Progress Notes (Signed)
 Patient successfully transferred to home. Pharmacist, community, provider, and Tax inspector. Per provider assessing titrating O2 to 3L remain >/=92%. Grand daughter reports patient was on 2L but leading up to hospitalization she desatted to 85% and they put her up to 4L. Patient on 3L O2 currently and satting at 93%. Denies distress or increased work of breathing will monitor.

## 2023-08-01 NOTE — Progress Notes (Signed)
 Virtual visit with Medic, grand daughter & agency caregiver. Pt identification done, coreg  held per provider as Lasix  is given. Transport to patients home in progress to [address], [place], [place] [address].

## 2023-08-01 NOTE — Plan of Care (Signed)
  Problem: Education: Goal: Knowledge of General Education information will improve Description: Including pain rating scale, medication(s)/side effects and non-pharmacologic comfort measures Outcome: Progressing   Problem: Health Behavior/Discharge Planning: Goal: Ability to manage health-related needs will improve Outcome: Progressing   Problem: Clinical Measurements: Goal: Ability to maintain clinical measurements within normal limits will improve Outcome: Progressing Goal: Will remain free from infection Outcome: Progressing Goal: Diagnostic test results will improve Outcome: Progressing Goal: Respiratory complications will improve Outcome: Progressing   Problem: Activity: Goal: Risk for activity intolerance will decrease Outcome: Progressing   Problem: Elimination: Goal: Will not experience complications related to bowel motility Outcome: Progressing   Problem: Pain Managment: Goal: General experience of comfort will improve and/or be controlled Outcome: Progressing   Problem: Safety: Goal: Ability to remain free from injury will improve Outcome: Progressing   Problem: Skin Integrity: Goal: Risk for impaired skin integrity will decrease Outcome: Progressing

## 2023-08-01 NOTE — Progress Notes (Signed)
 Virtual call [phone] patient is out of bed urinated and is sitting in living room. Daughter reports had a good B'day but is tired, reports in the am she is fatigued at baseline. No signs of confusion or distress, denies pain. Will call at 10a for med pass.

## 2023-08-02 LAB — BASIC METABOLIC PANEL WITH GFR
Anion gap: 9 (ref 5–15)
BUN: 36 mg/dL — ABNORMAL HIGH (ref 8–23)
CO2: 30 mmol/L (ref 22–32)
Calcium: 7.9 mg/dL — ABNORMAL LOW (ref 8.9–10.3)
Chloride: 97 mmol/L — ABNORMAL LOW (ref 98–111)
Creatinine, Ser: 1.82 mg/dL — ABNORMAL HIGH (ref 0.44–1.00)
GFR, Estimated: 25 mL/min — ABNORMAL LOW (ref >60)
Glucose, Bld: 116 mg/dL — ABNORMAL HIGH (ref 70–99)
Potassium: 4.2 mmol/L (ref 3.5–5.1)
Sodium: 136 mmol/L (ref 135–145)

## 2023-08-02 MED ORDER — TORSEMIDE 20 MG PO TABS
40.0000 mg | ORAL_TABLET | Freq: Every day | ORAL | Status: DC
  Administered 2023-08-03: 40 mg via ORAL
  Filled 2023-08-02 (×3): qty 2

## 2023-08-02 MED ORDER — FUROSEMIDE 10 MG/ML IJ SOLN
40.0000 mg | Freq: Every day | INTRAMUSCULAR | Status: DC | PRN
  Filled 2023-08-02: qty 4

## 2023-08-02 NOTE — Plan of Care (Signed)
  Problem: Education: Goal: Knowledge of General Education information will improve Description: Including pain rating scale, medication(s)/side effects and non-pharmacologic comfort measures Outcome: Progressing   Problem: Health Behavior/Discharge Planning: Goal: Ability to manage health-related needs will improve Outcome: Progressing   Problem: Clinical Measurements: Goal: Will remain free from infection Outcome: Progressing   Problem: Nutrition: Goal: Adequate nutrition will be maintained Outcome: Progressing   Problem: Coping: Goal: Level of anxiety will decrease Outcome: Progressing   Problem: Elimination: Goal: Will not experience complications related to bowel motility Outcome: Progressing   Problem: Safety: Goal: Ability to remain free from injury will improve Outcome: Progressing   Problem: Skin Integrity: Goal: Risk for impaired skin integrity will decrease Outcome: Progressing

## 2023-08-02 NOTE — Progress Notes (Signed)
 [id number] called patient to check wearable placement as O2 sat data fell off. The current health pad had been left disconnected. Unit is now charging agency caregiver Calton took patient O2sat reading at 95%. Patient reports having a good nights sleep caregiver notes decreased fatigue in walking and speech.

## 2023-08-02 NOTE — Progress Notes (Addendum)
 Progress Note   Patient: Allison Norman FMW:[id number] DOB: 1924/05/13 DOA: 07/28/2023     5 DOS: the patient was seen and examined on 08/02/2023    Patient identified herself as Allison Norman DOB 1924/11/09 Medic Norman present at bedside during encounter and performed the physical exam. Patient was seen today via video chat; my physical location [place], [place]   Brief hospital course:  89 yr old F with history of persistent atrial fibrillation, chronic diastolic CHF, CKD 3B/4, breast cancer s/p L mastectomy, chronic resp failure on 2 L/min.  Presented to the ED with worsening dyspnea and edema. Patient was noted to have been seen by her primary cardiologist on 7/17 for persistent LE edema. Diuretic dose was continued at torsemide  40mg  daily with some ? Medication compliance at home. Recommendations also included fluid restriction.  Unclear about sodium intake at home. Pt lives at home though has consistent caregivers as needed.    In the ER, pt afebrile, hemodynamcally stable. O2 sats stable on 2L. A-fib largely rate controlled, proBNP 6812, troponin 26, 28, creatinine 2.09, GFR 21 Chest x-ray noted small pleural effusions and interstitial edema.  As a part of hospital home prescreening, palliative care was formally consulted.  Patient as well as family.  In discussion with palliative care, family were agreeable to trial of diuresis with IV Lasix  with tentative plan for home hospice if no clinical improvement versus outpatient palliative care once medically optimized.  Patient was admitted to Hospital at [facility] program on 07/29/2023.  Further hospital course and management as outlined below.  Patient demonstrating gradual clinical improvement with diuresis.  Continues to require 4 L/min [place] oxygen , with baseline need of 2 L/min.  Edema slowly improving and no reports of dyspnea.   Assessment and Plan:  Acute on chronic diastolic CHF -2D ECHO 03/2022 w/ EF 65-70% and grade 2 diastolic  dysfunction Noted persistent mild SOB and LE swelling with some concern for medical and sodium compliance by outpatient cardiology  Hypoalbuminemia also contributing to third spacing edema 7/26 -- weight trending down 66.5 kg >> 64.5 kg yesterday, edema slowly improving, O2 needs stable but still on 4 L/min --Continue diuresis with Lasix  40 mg IV BID --Follow today's BMP, likely transition to PO regimen in next 24 hours based on Cr/GFR trend --Continue reduced Coreg  from 25 >> 6.25 mg BID (to give BP extra room for diuresis) - If soft BP before Lasix  --  PRN midodrine  5 mg BID PRN if BP before IV Lasix  is < 110/60.   - Monitor daily weights, edema for improvement - GDMT limited by CKD 4  Goals of Care - Conservative management given advanced age frailty and multiple medical problems - Palliative care consulted prior to admission to [facility] program -- they will follow outpatient after d/c vs transition to hospice pending clinical course -CODE STATUS was discussed with the patient on AM of 7/23, agreeable to DNR/DNI    Acute on Chronic Respiratory Failure: On 2L [place] chronically. Note mention of pulmonary hypertension findings on prior CT chest Oxygen  needs currently 4 L/min with O2 sats in low/mid 90's, dyspnea due to CHF decompensation.  7/26 attempted wean to 3 L/min, but spO2 desatted --Continue diuresis as above --Supplement O2 for goal spO2 > 90%, wean as tolerated   Persistent atrial fibrillation -- HR's controlled Prior history of cardioversions but appears to have been in persistent A-fib for years now --Continue amiodarone , carvedilol  and Eliquis  --Coreg  reduced 25 >> 6.25 mg due to soft BP's  with diuresis --AM Coreg  dose was held today   CKD stage 4 -- Baseline Cr 1.77 on 07/13/23 (range 1.6--2 typically). Cr on admission 2.09 >> 2.04 >> slightly improving  Cr 1.95 >> 1.94 >> 1.76 >> 1.82 today --Monitor closely with diuresis   Left elbow pain, Left upper extremity pain/swelling -  suspect from edema CHF with underlying history of left mastectomy limiting lymphatic flow.  No history of gout. Pt is on Eliquis , making DVT less likely --Voltaren  gel and Tylenol  PRN --Trial of ice and elevation --Diuresis as above --Discussed LUE doppler U/S to definitively rule out DVT after discussion family, will defer for now and monitor.  History of breast cancer status post Left mastectomy Noted      Subjective: Pt seen virtually with Medic in the home this  morning.  She reports getting a good night sleep last night back in her own bed.  Reports overall feeling well, denies any specific complaints including shortness of breath beyond baseline.   Physical Exam: Vitals:   08/01/23 1120 08/01/23 1600 08/01/23 1700 08/02/23 1000  BP: 116/71 (!) 145/91  133/73  Pulse: 88 90  86  Resp:    18  Temp: (!) 97.3 F (36.3 C) 97.8 F (36.6 C)    TempSrc: Oral     SpO2: 94% 93%  95%  Weight:   64.5 kg 66.9 kg  Height:       General exam: awake, alert, no acute distress HEENT: moist mucus membranes, hearing grossly normal  Respiratory system: lungs overall clear with faint right basilar crackles, no expiratory wheezes, normal respiratory effort at rest on 4 L/min [place] O2. No accessory muscle use seen or conversational dyspnea noted Cardiovascular system: normal S1/S2, RRR, trace to 1+ BLE edema improved Gastrointestinal system: soft, NT, ND Central nervous system:  no gross focal neurologic deficits, normal speech Extremities: left arm swelling improving, BLE edema trace-1+ more on the left than right Psychiatry: normal mood, congruent affect, judgement and insight appear normal   Data Reviewed:  Notable labs --   Cr trend: improved with diuresis 2.09>>2.04 >>1.95>>1.94 >> 1.76 yesterday >> 1.82 today  Cl 97 BUN 36 Ca 7.9   Family Communication: caregiver was present in the home during virtual encounter.  Updated granddaughter by phone this  afternoon   Disposition: Status is: Inpatient Remains inpatient appropriate because: requires further close monitoring for improvement and stability with transitioning IV to oral diuresis. Remains with O2 requirement above baseline.    Planned Discharge Destination: Home     Time spent: 40 minutes  Author: Burnard DELENA Cunning, DO 08/02/2023 3:48 PM  For on call review [email or web address].

## 2023-08-02 NOTE — Progress Notes (Addendum)
 Virtual visit with medic evening meds given patient refused PRN meds fpr right arm. Re-adjusted wearable to improve contact. O2 sat 96%. Informed agency caregiver Dickey if patient changes her mind and would like PRNs just hit the flag button and we will call and dispense. Both patient and caregiver verbalized back understanding.

## 2023-08-02 NOTE — Progress Notes (Signed)
 Found the patient for the PM visit, sitting in the same location, in the recliner, residing within the living room.   The patient is alert and oriented with ABCs intact. The patient has no complaints and assessment is largely unchanged.   Patient assessment and vitals signs obtained. Virtual visit with the RN facilitated. Ordered medications completed with virtual nurse oversight.   The patient voices no complaints at this time.

## 2023-08-02 NOTE — TOC Progression Note (Signed)
 Transition of Care [facility]) - Progression Note    Patient Details  Name: Allison Norman MRN: [id number] Date of Birth: May 14, 1924  [facility]) CM/SW Contact  [name], RN Phone Number: 08/02/2023, 4:09 PM  Clinical Narrative:     1600 IDR. Discussed oxygen   she is still requiring 4LPM, all aware she will need qualifications to increase from 2L to 4L., and orders for DME company.  [facility] will be following for palliative  when discharged. [facility] for Home health orders are placed.  IP CM will continue to follow possible DC Tuesday. Expected Discharge Plan: Home/Self Care (24 hour private care) Barriers to Discharge: Continued Medical Work up               Expected Discharge Plan and Services In-house Referral: Hospice / Palliative Care Discharge Planning Services: CM Consult, Follow-up appt scheduled   Living arrangements for the past 2 months: Single Family Home                   DME Agency: NA       HH Arranged: RN, PT   Date HH Agency Contacted: 08/01/23 Time HH Agency Contacted: 1600 Representative spoke with at [facility] Agency: Darleene Gowda   Social Drivers of Health (SDOH) Interventions SDOH Screenings   Food Insecurity: No Food Insecurity (07/29/2023)  Housing: Low Risk  (07/29/2023)  Transportation Needs: No Transportation Needs (07/29/2023)  Utilities: Not At Risk (07/29/2023)  Social Connections: Socially Isolated (07/29/2023)  Tobacco Use: Medium Risk (07/29/2023)    Readmission Risk Interventions    07/31/2023   11:26 AM 07/29/2023    2:54 PM 07/29/2023   10:12 AM  Readmission Risk Prevention Plan  Transportation Screening Complete Complete   PCP or Specialist Appt within 5-7 Days Complete    Home Care Screening Complete Complete Complete  Medication Review (RN CM) Complete Complete Complete

## 2023-08-02 NOTE — Progress Notes (Signed)
 Patients tablet still not charged sufficiently. Allison Norman nonproductive cough was treated with self administered PRN tessalon  pearls after patient was identified and medication identified with caregiver as well.

## 2023-08-02 NOTE — Progress Notes (Signed)
 RN contacted patient and her private caregiver, Lonell, via tablet. All bedtime medications were verified and given to patient with assistance from her caregiver. Patient noted some soreness to her elbow but declined prn Tylenol . Voltaren  cream was applied per orders. Patient and caregiver encouraged to reach out to RN if any needs arise overnight.

## 2023-08-02 NOTE — Plan of Care (Addendum)
 Patient alert & oriented x3. Reports had a good nights sleep feeling much better. Right arm soreness continues offered voltaren  gel and tylenol  patient reports pain is not strong enough. Rates pain as 3-4/10. Educated on how tylenol , ice, and voltaren  gel could provide relief for the level of pain reported. Patient still refuses.  No Lasix  tonight slight increase in Cr. Tomorrow will start on PO torsemide . Patient does have PRN 40mg  lasix  if needed. Will continue to monitor. Staff continues to monitor and O2 sats frequently during virtual calls as current health wearable still inconsistent in reading provider and current health aware and equipment exchanged once already.  Problem: Education: Goal: Knowledge of General Education information will improve Description: Including pain rating scale, medication(s)/side effects and non-pharmacologic comfort measures Outcome: Progressing   Problem: Clinical Measurements: Goal: Ability to maintain clinical measurements within normal limits will improve Outcome: Progressing Goal: Will remain free from infection Outcome: Progressing Goal: Cardiovascular complication will be avoided Outcome: Progressing   Problem: Activity: Goal: Risk for activity intolerance will decrease Outcome: Progressing   Problem: Nutrition: Goal: Adequate nutrition will be maintained Outcome: Progressing   Problem: Safety: Goal: Ability to remain free from injury will improve Outcome: Progressing

## 2023-08-02 NOTE — Progress Notes (Signed)
 Upon arrival to the patients residence, I was directed inside by the patients care giver.  The patient was found in the recliner, sitting upright, in no signs of obvious distress. ABCs intact. Normal work of breathing. Lung sounds reveal clear lungs sounds minus the right lower base. Mild rales noted, suggestive of current pathology.   Lymphopathy still present in the left arm thorughout but improved, with caregiver supporting these observations. Lower extremities still present with pitting edema. Of noted the right leg has improved with the left leg remaining largely the same. The patient has no complaints at this time. She denies chest pain, shortness of breath, or abdominal pain.   The patient voices improvement from last encounter.   Patient assessment, vitals signs obtained. Medication assisted with administration observed by virtual nurse. Virtual visit completed with the physicians. Labs Drawn and weight obtained.

## 2023-08-02 NOTE — Progress Notes (Signed)
 Attempted virtual call N/A called phone caregiver was with patient in the bathroom patient placed nasal cannula on top of nose and desatted to 88% [place] replaced and O2 92%. Patient denies any distress or dyspnea.

## 2023-08-02 NOTE — Progress Notes (Signed)
 No additional calls from patient overnight. Call placed this morning to have caregiver check oxygen  level. Patient was still sleeping but oxygen  level was 90% with oxygen  in place and pulse was 112 using patient's own pulse ox. Caregiver Lonell states no issues overnight and that patient has been sleeping well.

## 2023-08-02 NOTE — Progress Notes (Signed)
 1057 Virtual call with provider, Charity fundraiser ,Architect. Patient reports feeling much better had a good nights sleep in her bed. Reported improved cough after tessalon  pearls taken. Dicussed her trajectory with labs, vitals, showing decreased pulmonary crackles on auscultation, and some decreased swelling in the LLE we will continue diuresis and monitor to assist in determining discharge. VS BP 133/73. HR 103. O2Sat- 96%, Labs completed tablet charged and working.

## 2023-08-02 NOTE — Progress Notes (Signed)
 Bedtime medications given to patient with assistance from caregiver Dickey. Patient with no new complaints tonight. Continues with sore elbow, refused Tylenol , but accepted scheduled Voltaren  cream. Has cough, which patient states is non productive, Tessalon  pearls given. Caregiver noted some bleeding from patient's nose tonight. Patient states it's very slight in Lt nostril. Informed both patient and caregiver to notify RN if increase or continuous bleeding from nose. None noted by RN via camera at time of virtual call.

## 2023-08-02 NOTE — Progress Notes (Signed)
 Phone call made to patient and was able to speak with caregiver Dickey, who will be staying with patient overnight. Explained that the Virtual RN is available at any time and went over the different options for contact. Patient denies any needs at this time. Patient will reach out to RN when she is ready to take her bedtime medications.

## 2023-08-03 ENCOUNTER — Inpatient Hospital Stay (HOSPITAL_COMMUNITY)

## 2023-08-03 LAB — CBC WITH DIFFERENTIAL/PLATELET
Abs Immature Granulocytes: 0 K/uL (ref 0.00–0.07)
Basophils Absolute: 0 K/uL (ref 0.0–0.1)
Basophils Relative: 0 %
Eosinophils Absolute: 0.2 K/uL (ref 0.0–0.5)
Eosinophils Relative: 5 %
HCT: 36.2 % (ref 36.0–46.0)
Hemoglobin: 10.8 g/dL — ABNORMAL LOW (ref 12.0–15.0)
Lymphocytes Relative: 20 %
Lymphs Abs: 0.7 K/uL (ref 0.7–4.0)
MCH: 26.4 pg (ref 26.0–34.0)
MCHC: 29.8 g/dL — ABNORMAL LOW (ref 30.0–36.0)
MCV: 88.5 fL (ref 80.0–100.0)
Monocytes Absolute: 0.2 K/uL (ref 0.1–1.0)
Monocytes Relative: 6 %
Neutro Abs: 2.5 K/uL (ref 1.7–7.7)
Neutrophils Relative %: 69 %
Platelets: 149 K/uL — ABNORMAL LOW (ref 150–400)
RBC: 4.09 MIL/uL (ref 3.87–5.11)
RDW: 21.1 % — ABNORMAL HIGH (ref 11.5–15.5)
WBC: 3.6 K/uL — ABNORMAL LOW (ref 4.0–10.5)
nRBC: 0 % (ref 0.0–0.2)
nRBC: 0 /100{WBCs}

## 2023-08-03 LAB — BASIC METABOLIC PANEL WITH GFR
Anion gap: 9 (ref 5–15)
BUN: 33 mg/dL — ABNORMAL HIGH (ref 8–23)
CO2: 30 mmol/L (ref 22–32)
Calcium: 8.2 mg/dL — ABNORMAL LOW (ref 8.9–10.3)
Chloride: 98 mmol/L (ref 98–111)
Creatinine, Ser: 1.73 mg/dL — ABNORMAL HIGH (ref 0.44–1.00)
GFR, Estimated: 27 mL/min — ABNORMAL LOW (ref >60)
Glucose, Bld: 83 mg/dL (ref 70–99)
Potassium: 4.3 mmol/L (ref 3.5–5.1)
Sodium: 137 mmol/L (ref 135–145)

## 2023-08-03 LAB — BRAIN NATRIURETIC PEPTIDE: B Natriuretic Peptide: 992.9 pg/mL — ABNORMAL HIGH (ref 0.0–100.0)

## 2023-08-03 LAB — PROCALCITONIN: Procalcitonin: <0.1 ng/mL

## 2023-08-03 MED ORDER — FUROSEMIDE 10 MG/ML IJ SOLN
60.0000 mg | Freq: Every day | INTRAMUSCULAR | Status: DC | PRN
  Administered 2023-08-03: 60 mg via INTRAVENOUS
  Filled 2023-08-03 (×2): qty 6

## 2023-08-03 MED ORDER — POLYSACCHARIDE IRON COMPLEX 150 MG PO CAPS
150.0000 mg | ORAL_CAPSULE | Freq: Two times a day (BID) | ORAL | Status: DC
  Administered 2023-08-03 – 2023-08-05 (×5): 150 mg via ORAL
  Filled 2023-08-03 (×7): qty 1

## 2023-08-03 NOTE — Progress Notes (Signed)
 1400 no current health O2 data virtually called patient caregiver answered patient in bathroom and place cannula above nares. Per provider taking BP 119/74 met parameters identification of patient complete and self administered Coreg  with assistance of agency caregiver Cassandra.  Afternoon IDR CXR image populated final report not yet in. Provider will make clinical decision r/t Rt lower lung field fluid/consolidation possible aspiration PNA and if Abx Tx is needed.

## 2023-08-03 NOTE — Progress Notes (Signed)
 Progress Note   Patient: Allison Norman FMW:[id number] DOB: [birth year] DOA: 07/28/2023     6 DOS: the patient was seen and examined on 08/03/2023    Patient identified herself as Allison Norman DOB May 07, 1924 Medic Norman present at bedside during encounter and performed the physical exam. Patient was seen today via video chat; my physical location [place], [place]   Brief hospital course:  89 yr old F with history of persistent atrial fibrillation, chronic diastolic CHF, CKD 3B/4, breast cancer s/p L mastectomy, chronic resp failure on 2 L/min.  Presented to the ED with worsening dyspnea and edema. Patient was noted to have been seen by her primary cardiologist on 7/17 for persistent LE edema. Diuretic dose was continued at torsemide  40mg  daily with some ? Medication compliance at home. Recommendations also included fluid restriction.  Unclear about sodium intake at home. Pt lives at home though has consistent caregivers as needed.    In the ER, pt afebrile, hemodynamcally stable. O2 sats stable on 2L. A-fib largely rate controlled, proBNP 6812, troponin 26, 28, creatinine 2.09, GFR 21 Chest x-ray noted small pleural effusions and interstitial edema.  As a part of hospital home prescreening, palliative care was formally consulted.  Patient as well as family.  In discussion with palliative care, family were agreeable to trial of diuresis with IV Lasix  with tentative plan for home hospice if no clinical improvement versus outpatient palliative care once medically optimized.  Patient was admitted to [facility] program on 07/29/2023.  Further hospital course and management as outlined below.  Patient demonstrating gradual clinical improvement with diuresis.  Continues to require 4 L/min [employer] oxygen , with baseline need of 2 L/min.  Edema slowly improving and no reports of dyspnea.   Assessment and Plan:  Acute on chronic diastolic CHF -2D ECHO 03/2022 w/ EF 65-70% and grade 2 diastolic  dysfunction Noted persistent mild SOB and LE swelling with some concern for medical and sodium compliance by outpatient cardiology  Hypoalbuminemia also contributing to third spacing edema 7/26 -- weight trending down 66.5 kg >> 64.5 kg yesterday, edema slowly improving, O2 needs stable but still on 4 L/min 7/27 - Cr slightly rising, suggests intravascular volume depletion  --Held off PM diuresis yesterday with Cr rising --Home torsemide  40 mg this AM & follow today's labs  -- if stable or improved renal function will resume with further IV diuresis this afternoon --Check BNP and Chest x-ray today  --Continue reduced Coreg  from 25 >> 6.25 mg BID (to give BP extra room for diuresis) - If soft BP before Lasix  --  PRN midodrine  5 mg BID PRN if BP before IV Lasix  is < 110/60.   - Monitor daily weights, edema for improvement - GDMT limited by CKD 4    Acute on Chronic Respiratory Failure:  At baseline, on 2L/min [place] chronically.  Note findings of of pulmonary hypertension on prior CT chest. Oxygen  needs currently 4 L/min with O2 sats in low/mid 90's, dyspnea due to CHF decompensation.  7/26 attempted wean to 3 L/min, but spO2 desatted 7/27--28 - remains on 4 L/min O2 --Continue diuresis as above --Supplement O2 for goal spO2 > 90%, wean as tolerated --Chest-xray and BNP today to compare to admission   Goals of Care - Conservative management given advanced age frailty and multiple medical problems - Palliative care consulted prior to admission to [facility] program -- they will follow outpatient after d/c vs transition to hospice pending clinical course -CODE STATUS was discussed with  the patient on AM of 7/23, agreeable to DNR/DNI     Persistent atrial fibrillation -- HR's controlled Prior history of cardioversions but appears to have been in persistent A-fib for years now 7/28 -- intermittent HR elevations, suspect due to lower dose Coreg  --Continue amiodarone , carvedilol  and  Eliquis  --Coreg  reduced 25 >> 6.25 mg due to soft BP's with diuresis --AM Coreg  dose was held today   CKD stage 4 -- Baseline Cr 1.77 on 07/13/23 (range 1.6--2 typically). Cr on admission 2.09 >> 2.04 >> slightly improving  Cr 1.95 >> 1.94 >> 1.76 >> 1.82 >> 1.73 today --Monitor closely with diuresis   Left elbow pain, Left upper extremity pain/swelling - suspect from edema CHF with underlying history of left mastectomy limiting lymphatic flow.  No history of gout. Pt is on Eliquis , making DVT less likely --Voltaren  gel and Tylenol  PRN --Trial of ice and elevation --Diuresis as above --Discussed LUE doppler U/S to definitively rule out DVT after discussion family, will defer for now and monitor.  History of breast cancer status post Left mastectomy Noted      Subjective: Pt seen virtually with Medic in the home this  morning.  Pt reports being tired, didn't sleep as well last night.  She states cough medication didn't help very much.  Had a BM yesterday.  She does admit to sometimes coughing when eating and drinking.     Physical Exam: Vitals:   08/01/23 1600 08/01/23 1700 08/02/23 1000 08/02/23 1800  BP: (!) 145/91  133/73 (!) 148/88  Pulse: 90  86 (!) 105  Resp:   18 19  Temp: 97.8 F (36.6 C)   (!) 97.3 F (36.3 C)  TempSrc:    Oral  SpO2: 93%  95% 96%  Weight:  64.5 kg 66.9 kg   Height:       General exam: awake, alert, no acute distress HEENT: moist mucus membranes, hearing grossly normal  Respiratory system: lungs clear with persistent right base crackles slightly more prominent today, normal respiratory effort at rest on 4 L/min [place] O2. No accessory muscle use seen or conversational dyspnea noted Cardiovascular system: normal S1/S2, RRR, trace to 1+ BLE edema relatively unchanged Gastrointestinal system: soft, NT, ND Central nervous system:  no gross focal neurologic deficits, normal speech Extremities: persistent BLE edema trace-1+ more on the left than  right Psychiatry: normal mood, congruent affect, judgement and insight appear normal   Data Reviewed:  Notable labs --   Cr trend: improving with diuresis 2.09>>2.04 >>1.95>>1.94 >> 1.76 >> 1.82 >> 1.73 today  BUN 36 >> 33 Ca 8.2  CBC with: WBC 3.6, mild leukopenia Hbg stable 10.4 >> 10.8 Platelets 149k  Family Communication: caregiver was present in the home during virtual encounter.  Updated granddaughter by phone afternoon on 7/27. Will attempt to call again today.   Disposition: Status is: Inpatient Remains inpatient appropriate because: requires further clinical improvement and diuresis. Remains with O2 requirement above baseline.    Planned Discharge Destination: Home     Time spent: 45 minutes  Author: Burnard DELENA Cunning, DO 08/03/2023 1:14 PM  For on call review [email or web address].

## 2023-08-03 NOTE — Progress Notes (Signed)
 Virtual visit with medic and RN patients initial BP per medic incorrect she was poorly positioned and not still. Medic and caregiver repositioned patient and retook BP 144/88, O2Sat 97% medic documented in EPIC. Administered Lasix  40mg . Standing weight 66.5kg/146.3lbs. Remainder of PO meds given well tolerated. Report given to night RN.

## 2023-08-03 NOTE — Progress Notes (Signed)
 Virtual visit with Allison Norman and her caregiver, Lonell. Allison Norman just returned from the bathroom and is breathing without difficulty. She has no questions or concerns. Medications reviewed and administered by the caregiver. Discussed plan for overnight and reviewed the plan for tomorrow. Encouraged to call with any concerns overnight.

## 2023-08-03 NOTE — Progress Notes (Signed)
 Found patient for the PM visit sitting in the recliner, in no signs of distress.   The patient states that she feels back to her normal. ABCs are intact. Pedal edema improved slightly. Lung sounds unchanged. No new complaints of findings.   Vitals taken, assessment obtained. Virtual call with nurse completed. Medications administered with virtual nurse oversight. Lasix  given per order.

## 2023-08-03 NOTE — Progress Notes (Signed)
 9054 Virtual visit with provider, Charity fundraiser, and Tax inspector. Patient not slept well fussing with nose with tissue, alert and oriented x 3, refusing nasal spray and voltaren  gel. Loss adjuster, chartered. New meds torsemide  and iron  today BP 162/95, T98.1 F, O2 94%, more tachy this AM 111BPM, Provider explained pending labs sending patient for CXR rule out PNA. pick up 1300p.  Is asking for breakfast has appetite.

## 2023-08-03 NOTE — Progress Notes (Signed)
 Phone call made to patient due to low oxygen  readings in current health. RN had caregiver Dickey to spot check patient's oxygen  level with patient's home pulse ox, O2 at 92% and pulse was 113. Patient was sleeping and oxygen  was in place. Patient denies any needs at this time and was is no acute distress.

## 2023-08-03 NOTE — Progress Notes (Addendum)
 Called patient O2 wearable data dropped patient's caregiver and RN repositioned device. Patient and caregiver aware of transport to radiology time and report no distress, pain, chills, or increased cough. AM data trend including HR, O2Sat, and RR forwarded to provider for review.  Patient ate  breakfast toast & apple butter. Had short nap.   Team arrive 1250 to pick up patient for transport to radiology for CXR. Radiology instructed patient to go to registration desk inform desk patient is already registered and ask for radiologist to come retrieve patient.  Radiology at: [address] entrance [place] Phone # [phone].

## 2023-08-03 NOTE — Progress Notes (Signed)
 Arrived to the patients home for a scheduled AM visit. My self and one of the virtual nurse were attending this visit.   The patients caregiver facilitated enter into the residence. Found the patient sitting upright in the recliner. The patients ABCs are intact and appeared in no signs of visual distress.   Left arm lymphopathy is unchanged since yesterday evening PM visit. Lung sounds are unchanged with crackles still present in the lower right lung fields.   Vitals and physical assessment was obtained. Virtual visit as conducted with the nurse and physician. Medications were dispensed and provided with virtual RN present.   The patient was explained about the XRAY ordered and voiced no other concerns.

## 2023-08-04 DIAGNOSIS — I5033 Acute on chronic diastolic (congestive) heart failure: Secondary | ICD-10-CM

## 2023-08-04 DIAGNOSIS — J9621 Acute and chronic respiratory failure with hypoxia: Secondary | ICD-10-CM

## 2023-08-04 DIAGNOSIS — I4819 Other persistent atrial fibrillation: Secondary | ICD-10-CM

## 2023-08-04 DIAGNOSIS — N184 Chronic kidney disease, stage 4 (severe): Secondary | ICD-10-CM

## 2023-08-04 LAB — BASIC METABOLIC PANEL WITH GFR
Anion gap: 11 (ref 5–15)
BUN: 32 mg/dL — ABNORMAL HIGH (ref 8–23)
CO2: 32 mmol/L (ref 22–32)
Calcium: 7.8 mg/dL — ABNORMAL LOW (ref 8.9–10.3)
Chloride: 95 mmol/L — ABNORMAL LOW (ref 98–111)
Creatinine, Ser: 1.75 mg/dL — ABNORMAL HIGH (ref 0.44–1.00)
GFR, Estimated: 26 mL/min — ABNORMAL LOW (ref >60)
Glucose, Bld: 147 mg/dL — ABNORMAL HIGH (ref 70–99)
Potassium: 3.9 mmol/L (ref 3.5–5.1)
Sodium: 138 mmol/L (ref 135–145)

## 2023-08-04 LAB — MAGNESIUM: Magnesium: 2.3 mg/dL (ref 1.7–2.4)

## 2023-08-04 MED ORDER — FUROSEMIDE 10 MG/ML IJ SOLN
60.0000 mg | Freq: Two times a day (BID) | INTRAMUSCULAR | Status: DC
  Administered 2023-08-04 – 2023-08-05 (×3): 60 mg via INTRAVENOUS
  Filled 2023-08-04 (×5): qty 6

## 2023-08-04 MED ORDER — ALBUMIN HUMAN 25 % IV SOLN
12.5000 g | Freq: Two times a day (BID) | INTRAVENOUS | Status: DC
  Administered 2023-08-04: 12.5 g via INTRAVENOUS
  Filled 2023-08-04 (×4): qty 50

## 2023-08-04 NOTE — Progress Notes (Signed)
 Pt's evening home visit was conducted and pt was noted to be alert and oriented x4. The pt was given her evening medications and vitals were obtained and are as noted. Pt's Albumin  was given and no reaction noted. The pt and her care giver stated that she will call the nurse do her night time medications.

## 2023-08-04 NOTE — Progress Notes (Signed)
 Spoke with pt via phone. She states she didn't sleep very well, but feels ok this am. Currently eating breakfast. Updated her on plan for paramedic visit between 0930 and 1000 and including RN/MD virtual visit.Pt agreeable.

## 2023-08-04 NOTE — Plan of Care (Signed)
°  Problem: Education: Goal: Knowledge of General Education information will improve Description: Including pain rating scale, medication(s)/side effects and non-pharmacologic comfort measures Outcome: Progressing   Problem: Health Behavior/Discharge Planning: Goal: Ability to manage health-related needs will improve Outcome: Progressing   Problem: Clinical Measurements: Goal: Ability to maintain clinical measurements within normal limits will improve Outcome: Progressing   Problem: Clinical Measurements: Goal: Respiratory complications will improve Outcome: Progressing   Problem: Activity: Goal: Risk for activity intolerance will decrease Outcome: Progressing   Problem: Nutrition: Goal: Adequate nutrition will be maintained Outcome: Progressing   Problem: Coping: Goal: Level of anxiety will decrease Outcome: Progressing

## 2023-08-04 NOTE — Progress Notes (Signed)
 2202- Patient /caregiver contact via Video call. Patient identifiers review  patient sitting up in chair with feet elevated.  Caregiver is present. Medication administration and assessment completed with the assistance of the caregiver.  Patient denies the need for additional support at this time. Denies, pain,  or change in condition after albumin  and Lasix  doses. VS remain stable Overnight monitoring plan and [facility] contact information reviewed. When to call RN vs 911 for emergency needs and s/sx of change in condition, plan for AM medications reviewed. Patient will continue to be monitored.

## 2023-08-04 NOTE — Progress Notes (Addendum)
 Progress Note   Patient: Allison Norman FMW:[id number] DOB: 08/27/1924 DOA: 07/28/2023     7 DOS: the patient was seen and examined on 08/04/2023    Brief hospital course:  89 yr old F with history of persistent atrial fibrillation, chronic diastolic CHF, CKD 3B/4, breast cancer s/p L mastectomy, chronic resp failure on 2 L/min.  Presented to the ED with worsening dyspnea and edema. Patient was noted to have been seen by her primary cardiologist on 7/17 for persistent LE edema. Diuretic dose was continued at torsemide  40mg  daily with some ? Medication compliance at home. Recommendations also included fluid restriction.  Unclear about sodium intake at home. Pt lives at home though has consistent caregivers as needed.    In the ER, pt afebrile, hemodynamcally stable. O2 sats stable on 2L. A-fib largely rate controlled, proBNP 6812, troponin 26, 28, creatinine 2.09, GFR 21 Chest x-ray noted small pleural effusions and interstitial edema.  As a part of hospital home prescreening, palliative care was formally consulted.  Patient as well as family.  In discussion with palliative care, family were agreeable to trial of diuresis with IV Lasix  with tentative plan for home hospice if no clinical improvement versus outpatient palliative care once medically optimized.  Patient was admitted to [facility] program on 07/29/2023.  Further hospital course and management as outlined below.  Patient demonstrating gradual clinical improvement with diuresis.  Continues to require 4 L/min [place] oxygen , with baseline need of 2 L/min.  Edema slowly improving and no reports of dyspnea.   Assessment and Plan:  Acute on chronic diastolic CHF -2D ECHO 03/2022 w/ EF 65-70% and grade 2 diastolic dysfunction Noted persistent mild SOB and LE swelling with some concern for medical and sodium compliance by outpatient cardiology  Hypoalbuminemia also contributing to third spacing edema 7/29 -- escalated Lasix  to 60 mg IV  BID.  Urine output improved.  Albumin  before PM dose this evening.  Clinically remains volume overloaded with O2 need above baseline, persistent edema. Overall feels much better, dyspnea resolved. --Lasix  60 mg IV BID --IV albumin  before lasix  to facilitate diuresis --Continue reduced Coreg  from 25 >> 6.25 mg BID (to give BP extra room for diuresis) - If soft BP before Lasix  --  PRN midodrine  5 mg BID PRN  - Monitor daily weights, edema for improvement - Further GDMT limited by CKD 4     Acute on Chronic Respiratory Failure:  At baseline, on 2L/min [place] chronically.  Note findings of of pulmonary hypertension on prior CT chest.  Oxygen  needs remain at 4 L/min with O2 sats in low/mid 90's, dyspnea due to CHF decompensation.  7/26 attempted wean to 3 L/min, but spO2 desatted 7/27--29 - remains on 4 L/min O2 Chest-xray repeated 7/28 -- similar CHF findings, BNP still elevated. Procal negative & no focal infiltrates, less likely infectious component --Continue diuresis as above --Supplement O2 for goal spO2 > 90%, wean as tolerated --Diuresis escalated as above   Goals of Care - Conservative management given advanced age frailty and multiple medical problems - Palliative care consulted prior to admission to [facility] - Inpatient program -- they will follow outpatient after d/c vs transition to hospice pending clinical course -CODE STATUS was discussed with the patient on AM of 7/23, agreeable to DNR/DNI  -7/29 - Revisited goals of care this afternoon at house visit.  Pt wishes to further improve her edema, understanding escalating efforts to diurese could cause her kidneys to fail or complications of low BP's.  She  understands and agrees to try for now.  She is agreeable to have Palliative Care follow after discharge, but understands she can ask us  to stop and transition to hospice at any time and states she will keep thinking about it.    Persistent atrial fibrillation -- HR's controlled Prior history of  cardioversions but appears to have been in persistent A-fib for years now 7/28-29 -- intermittent HR elevations, suspect due to lower dose Coreg  --Continue amiodarone , carvedilol  and Eliquis  --Coreg  reduced 25 >> 6.25 mg due to soft BP's with diuresis.     CKD stage 4 -- Baseline Cr 1.77 on 07/13/23 (range 1.6--2 typically). Cr on admission 2.09 >> 2.04 >> slightly improving  Cr 1.95 >> 1.94 >> 1.76 >> 1.82 >> 1.73 >> 1.75 today --Monitor closely with diuresis   Left elbow pain, Left upper extremity pain/swelling - suspect from edema CHF with underlying history of left mastectomy limiting lymphatic flow.  No history of gout. Pt is on Eliquis , making DVT less likely. Doppler U/S declined after discussion.   She has point tenderness at the medial epicondyle, but not history of any trauma.  Pt and granddaughter agreeable to conservative mgmt, no imaging needed. --Voltaren  gel and Tylenol  PRN --Ice and elevation --Diuresis as above   History of breast cancer status post Left mastectomy Noted     Subjective: Pt seen in-person today for a house call.  She reports fatigue from not sleeping well last night.  Legs still with swelling, up to thighs maybe little improved but still uncomfortable.   Left elbow still tender on the inside and swelling, but not worsening, and has not felt hot to touch.  She reports breathing feels about normal for her.  Does report increased urination today after this morning's increased lasix  dose.     Physical Exam: Vitals:   08/02/23 1800 08/03/23 0900 08/03/23 1900 08/04/23 0900  BP: (!) 148/88 (!) 162/95 (!) 144/78 135/67  Pulse: (!) 105 (!) 111 88 100  Resp: 19 19 18 20   Temp: (!) 97.3 F (36.3 C)   (!) 97.3 F (36.3 C)  TempSrc: Oral   Oral  SpO2: 96% 94% 96% 93%  Weight:  66.5 kg    Height:       In-Person exam during house call   General exam: awake, alert, no acute distress HEENT: moist mucus membranes, hearing grossly normal  Respiratory system:  lungs overall clear with diminished bases and faint bibasilar crackles more prominent on left, normal respiratory effort at rest, on 4 L/min [place] O2, no expiratory wheezes no rhonchi Cardiovascular system: normal S1/S2, RRR, bilateral LE edema with distal 1+ pitting, proximal dependent edema noted to mid-thighs Gastrointestinal system: soft, NT, ND Central nervous system:  A&O x 3. no gross focal neurologic deficits, normal speech Extremities: moves all, left hand mild swelling, left elbow swelling without warmth or erythema, focal bony tenderness at the medial epicondyle otherwise elbow is non-tender but edematous Psychiatry: normal mood, congruent affect, judgement and insight appear normal   Data Reviewed:  Notable labs --   Cr improving with diuresis 2.09>>2.04 >>1.95>>1.94 >> 1.76 >> 1.82 >> 1.73 >> 1.75 stable  Last CBC 7/28 with: WBC 3.6, mild leukopenia Hbg stable 10.4 >> 10.8 Platelets 149k  Procal < 0.10  Repeat CXR 7/28 -- similar findings of CHF, no focal consolidations to suggest pneumonia. Impression: Small bilateral pleural effusions and patchy bibasilar airspace opacities unchanged from prior.   Family Communication: granddaughter was present during house call this afternoon.  Disposition: Status is: Inpatient Remains inpatient appropriate because: requires further clinical improvement and diuresis. Remains with O2 requirement above baseline and persistent edema and volume overload.    Planned Discharge Destination: Home     Time spent: 60 minutes  Author: Burnard DELENA Cunning, DO 08/04/2023 4:55 PM  For on call review [email or web address].

## 2023-08-04 NOTE — Progress Notes (Signed)
 PT Cancellation Note  Patient Details Name: Allison Norman MRN: [id number] DOB: [birth year]   Cancelled Treatment:    Reason Eval/Treat Not Completed: Other (comment). PT reached out to RN and paramedic via secure chat. Based on reports the pt continues to move well with caregiver assistance within the home, likely not far from her baseline. PT will check in again on Friday, if the pt continues to mobilize well acute PT services will likely sign off as the pt has been regularly mobilizing at home and has necessary caregiver support.   Allison Norman Ruth 08/04/2023, 9:01 AM

## 2023-08-04 NOTE — Plan of Care (Signed)
  Problem: Education: Goal: Knowledge of General Education information will improve Description: Including pain rating scale, medication(s)/side effects and non-pharmacologic comfort measures Outcome: Progressing   Problem: Clinical Measurements: Goal: Will remain free from infection Outcome: Progressing   Problem: Nutrition: Goal: Adequate nutrition will be maintained Outcome: Progressing   Problem: Elimination: Goal: Will not experience complications related to bowel motility Outcome: Progressing Goal: Will not experience complications related to urinary retention Outcome: Progressing   Problem: Safety: Goal: Ability to remain free from injury will improve Outcome: Progressing   

## 2023-08-05 ENCOUNTER — Other Ambulatory Visit: Payer: Self-pay | Admitting: Nurse Practitioner

## 2023-08-05 DIAGNOSIS — N184 Chronic kidney disease, stage 4 (severe): Secondary | ICD-10-CM

## 2023-08-05 DIAGNOSIS — I5032 Chronic diastolic (congestive) heart failure: Secondary | ICD-10-CM

## 2023-08-05 DIAGNOSIS — I4819 Other persistent atrial fibrillation: Secondary | ICD-10-CM

## 2023-08-05 DIAGNOSIS — N1832 Chronic kidney disease, stage 3b: Secondary | ICD-10-CM

## 2023-08-05 DIAGNOSIS — I10 Essential (primary) hypertension: Secondary | ICD-10-CM

## 2023-08-05 DIAGNOSIS — R600 Localized edema: Secondary | ICD-10-CM

## 2023-08-05 DIAGNOSIS — D509 Iron deficiency anemia, unspecified: Secondary | ICD-10-CM

## 2023-08-05 DIAGNOSIS — E8809 Other disorders of plasma-protein metabolism, not elsewhere classified: Secondary | ICD-10-CM

## 2023-08-05 DIAGNOSIS — J9621 Acute and chronic respiratory failure with hypoxia: Secondary | ICD-10-CM

## 2023-08-05 MED ORDER — METOPROLOL TARTRATE 25 MG PO TABS: 12.5000 mg | ORAL_TABLET | Freq: Two times a day (BID) | ORAL | 0 refills | Status: AC

## 2023-08-05 MED ORDER — PANTOPRAZOLE SODIUM 40 MG PO TBEC: 40.0000 mg | DELAYED_RELEASE_TABLET | Freq: Every evening | ORAL | 0 refills | Status: DC

## 2023-08-05 MED ORDER — POTASSIUM CHLORIDE 20 MEQ PO PACK
40.0000 meq | PACK | Freq: Two times a day (BID) | ORAL | Status: DC
  Administered 2023-08-05: 40 meq via ORAL
  Filled 2023-08-05 (×3): qty 2

## 2023-08-05 MED ORDER — POTASSIUM CHLORIDE 20 MEQ PO PACK: 40.0000 meq | PACK | Freq: Every day | ORAL | 0 refills | Status: DC

## 2023-08-05 MED ORDER — METOLAZONE 5 MG PO TABS
5.0000 mg | ORAL_TABLET | Freq: Every day | ORAL | Status: DC | PRN
  Filled 2023-08-05: qty 1

## 2023-08-05 MED ORDER — FUROSEMIDE 40 MG PO TABS: 60.0000 mg | ORAL_TABLET | Freq: Two times a day (BID) | ORAL | 0 refills | Status: DC

## 2023-08-05 MED ORDER — METOPROLOL TARTRATE 12.5 MG HALF TABLET
12.5000 mg | ORAL_TABLET | Freq: Two times a day (BID) | ORAL | Status: DC
  Filled 2023-08-05 (×3): qty 1

## 2023-08-05 NOTE — Progress Notes (Addendum)
 Pt seen for routine [facility] visit. Pt reports sleeping well, still feels slightly groggy and tired. Vital signs and assessment obtained. Pt had virtual visit with provider, RN, and Pt's granddaughter. Plan of care discussed.   Pt refused Voltaren  gel and fluticasone . Metoprolol  held per order parameters and RN and MD made aware.   Pt refused weight this morning but is aware she will need to be weighed before possible discharge.   Labs held per Dr. Laurence.  IMM letter signed at 10:00 am.

## 2023-08-05 NOTE — Progress Notes (Signed)
 Hospital at Home PROGRESS NOTE    TAMU GOLZ  FMW:[id number] DOB: 11-12-1924 DOA: 07/28/2023 PCP: Okey Carlin Redbird, MD  Subjective: Pt seen during video visit. Paramedic Sarah present. Pt declined to get on scale for a weight check stating she was tired this AM. Pt stated she slept well last night. No DOE today. Paramedic states that left hand/arm edema has improved. Similar trace pretibial edema bilateral LE.  Able to arrange for the patient's granddaughter [name] Stain to be present on the video call as well.  I relayed to the patient and her granddaughter that I spoke personally with Dr. Fausto this morning.  1 to be sure that what was conveyed to me verbally by Dr. Fausto   in summary, patient does not want to jeopardize her renal function with aggressive IV diuresis.  She states that she wants to let her faith guide her in that God will do what he wants with her.  Patient is not ready for hospice care.  She wants to remain a DNR.  Patient agreement to receive 1 final dose of IV Lasix .  60 mg this morning.  Patient does not want any further labs drawn.  Patient and the granddaughter [name] are both in agreement to discharge from the hospital at home program later on today.  Will discharge her on 60 mg of Lasix  twice a day.  Patient will follow-up and get a repeat BMP in the cardiology office on Friday.  I will attempt to arrange this with their outpatient cardiology APP.  The granddaughter had voiced concerns that patient did not like her medications being changed.  Also discussed changing her beta-blocker over to Lopressor  12.5 mg twice daily to give her blood pressure more room for diuresis with p.o. Lasix  instead of using Coreg .  Patient stated that she was agreeable to these changes.      Provider Location: [place], [place] Patient examined by: Lauraine, paramedic  [facility] Course: HPI: KARLEY PHO is a 89 y.o. female with medical history significant of Afib s/p DCCV in 2017  with re-occurrence in 2018; now rate controlled, HTN, CKD3b, and breast/skin cancer p/w HFpEF exacerbation and AKI.   Pt reports being in her USOH until this past week. Per pt her breathing and LE edema has worsened. Pt notes that she does not eat fast food, and only limits her self to a few snacks (cheese and crackers). Of note, pt was evaluated by her OP cards on 7/17 and has been on increasing doses of diuretics for some time with little to no relief; in fact, PO lasix  40mg  daily increased to 80mg  daily, before being switched to torsemide  40mg  daily and recently increased to torsemide  80mg  daily.   In the ED, pt tachypneic on 2L [place]. Labs notable for Cr 2.09 (Cr 1.77 on 7/7), and BNP 822. CXR showed Small bilateral pleural effusions with bibasilar atelectasis. EDP ordered IV lasix  60mg  and requested medicine admission for ongoing care.     Significant Events: Admitted 07/28/2023 for acute on chronic diastolic CHF 07-29-2023 Pt transferred to [facility] program. As a part of hospital home prescreening, palliative care was formally consulted.  Patient as well as family.  In discussion with palliative care, family were agreeable to trial of diuresis with IV Lasix  with tentative plan for home hospice if no clinical improvement versus outpatient palliative care once medically optimized.  Code Status Changed to DNR/DNI 07-30-2023 IV lasix  dose reduced from 80 mg-->40 mg bid due to soft Bps.  coreg  25 mg--> 6.25 mg bid to give BP more room for diuresis. Prn midodrine  added for low BP prior to IV lasix  dose. Left UE  U/S considered to evaluate left UE swelling but family declined U/S after management discussion with attending 07-31-2023 pt celebrated 89th birthday at home with her family 08-01-2023 weight decreased from 66.6 kg --> 64.5 kg 08-03-2023 pt transported for CXR 08-04-2023 IV lasix  increased from 40 mg bid --> 60 mg BID. Given IV albumin  prior to IV lasix . H@H  hospitalist made house call to see  patient.  Pt wishes to further improve her edema, understanding escalating efforts to diurese could cause her kidneys to fail or complications of low BP's.   Admission Labs: WBC 4.7, HgB 10.9, plt 155 Na 138, K 3.8, CO2 of 28, BUJN 41, Scr 2.09, glu 108 T. Prot 6.3, alb 2.7, AST 23, ALT 15, alk phos 99. T. Bili 1.1 BNP 822  Admission Imaging Studies: CXR Small bilateral pleural effusions with bibasilar atelectasis. 2. Cardiomegaly with findings suggestive of interstitial pulmonary edema.  Significant Labs: 08-03-2023 procal < 0.10 08-03-2023 BNP 992  Significant Imaging Studies: 08-03-2023 Small bilateral pleural effusions and patchy bibasilar airspace opacities unchanged from prior.  Left-sided pacemaker is present   Antibiotic Therapy: Anti-infectives (From admission, onward)    None       Procedures:   Consultants: Palliative care    Assessment and Plan: * Acute on chronic diastolic CHF (congestive heart failure) (HCC) 08-05-2023 pt has been on IV diuresis for the last 7-8 days. About 6 lb drop in weight(at least as of yesterday). Pt refused to get weighed this AM.  Pt stated she slept well last night. No DOE today. Paramedic states that left hand/arm edema has improved. Similar trace pretibial edema bilateral LE.   Able to arrange for the patient's granddaughter [name] Stain to be present on the video call as well.  I relayed to the patient and her granddaughter that I spoke personally with Dr. Fausto this morning.  1 to be sure that what was conveyed to me verbally by Dr. Fausto   in summary, patient does not want to jeopardize her renal function with aggressive IV diuresis.  She states that she wants to let her faith guide her in that God will do what he wants with her.  Patient is not ready for hospice care.  She wants to remain a DNR/DNI.  Patient agreement to receive 1 final dose of IV Lasix .  60 mg this morning.  Patient does not want any further labs drawn.   Patient and the granddaughter [name] are both in agreement to discharge from the hospital at home program later on today.  Will discharge her on 60 mg of Lasix  twice a day.  Patient will follow-up and get a repeat BMP in the cardiology office on Friday.  I will attempt to arrange this with their outpatient cardiology APP.  The granddaughter had voiced concerns that patient did not like her medications being changed.  Also discussed changing her beta-blocker over to Lopressor  12.5 mg twice daily to give her blood pressure more room for diuresis with p.o. Lasix  instead of using Coreg .  Patient stated that she was agreeable to these changes.  Will send referral to outpatient palliative care.   Acute on chronic respiratory failure with hypoxia (HCC) - chronically on 2 L/min At baseline, on 2L/min [place] chronically.  Note findings of of pulmonary hypertension on prior CT chest.  Oxygen  needs remain at 4 L/min  with O2 sats in low/mid 90's, dyspnea due to CHF decompensation.  7/26 attempted wean to 3 L/min, but spO2 desatted 7/27--29 - remains on 4 L/min O2 Chest-xray repeated 7/28 -- similar CHF findings, BNP still elevated. Procal negative & no focal infiltrates, less likely infectious component --Continue diuresis as above --Supplement O2 for goal spO2 > 90%, wean as tolerated --Diuresis escalated as above 08-05-2023 pt will continue on 2-4 L/min O2. Paramedic reports lung sounds are clear and no rales.  Edema due to hypoalbuminemia 08-05-2023 pt received 1 dose of IV albumin  to help augment her diuresis. Based on conversation with pt and her grand-dtr today, pt does not want to risk any further worsening of her renal function in order to gain additional diuresis with IV diuretics. She will be discharged from [facility] program today. Lasix  60 mg bid. F/u with repeat BMP this Friday. F/u with outpatient cards for further outpatient management of her chronic diastolic CHF.  CKD (chronic kidney disease),  stage IV (HCC) - baseline scr 1.7-2.0 Prior to 08-05-2023 Cr on admission 2.09 >> 2.04 >> slightly improving  Cr 1.95 >> 1.94 >> 1.76 >> 1.82 >> 1.73 >> 1.75  08-05-2023 pt declined to have any further labs drawn. Wants to go to cards office this Friday for repeat BMP. I have sent message to cards APP that saw in the office this month to have this arranged.  Pulmonary hypertension, unspecified (HCC) Prior to 08-05-2023 Note findings of of pulmonary hypertension on prior CT chest.  08-05-2023 pt should f/u with cards regarding findings on CT chest  Iron  deficiency anemia Prior to 08-05-2023. Prior iron  studies in 2022 showed iron  deficiency anemia. Pt remains on oral iron  supplements.  DC HgB of 10.8 Iron /TIBC/Ferritin/ %Sat    Component Value Date/Time   IRON  15 (L) 09/05/2020 1828   TIBC 416 09/05/2020 1828   FERRITIN 33 09/05/2020 1828   IRONPCTSAT 4 (L) 09/05/2020 1828     Persistent atrial fibrillation (HCC) Prior history of cardioversions but appears to have been in persistent A-fib for years now 7/28 to 7-29 -- intermittent HR elevations, suspect due to lower dose Coreg  --Continue amiodarone , carvedilol  and Eliquis  --Coreg  reduced 25 >> 6.25 mg due to soft BP's with diuresis.    08-05-2023 pt remains on eliquis  and amiodarone . Changed to lopressor  12.5 mg bid. DC coreg .  Essential hypertension Prior to 08-05-2023. Pt's coreg  dose reduced from 25 mg bid -->6.25 mg bid in order to give her BP more room for diuresis. Prn midodrine  was also added to help boost her BP prior to any doses of IV lasix . Fortunately, she did not have to use any prn midodrine .  08-05-2023 after further discussion with pharmacy, her beta blocker will be changed to lopressor  12.5 mg bid instead of coreg . This should control her afib heart rate but not drop her blood pressure so much.  The granddaughter had voiced concerns that patient did not like her medications being changed. Also discussed changing her  beta-blocker over to Lopressor  12.5 mg twice daily to give her blood pressure more room for diuresis with p.o. Lasix  instead of using Coreg . Patient stated that she was agreeable to these changes.    DVT prophylaxis: Place TED hose Start: 07/29/23 1315 apixaban  (ELIQUIS ) tablet 2.5 mg Start: 07/28/23 2200 apixaban  (ELIQUIS ) tablet 2.5 mg     Code Status: Limited: Do not attempt resuscitation (DNR) -DNR-LIMITED -Do Not Intubate/DNI  Family Communication: pt's grand-dtr [name] Stain participated in video call Disposition Plan:  home Reason for continuing need for hospitalization: stable for DC today.  Objective: Vitals:   08/04/23 1900 08/04/23 1950 08/05/23 0234 08/05/23 0900  BP: 124/82 126/78  113/78  Pulse: 94 95 89 90  Resp: 20 (!) 22  20  Temp: 97.8 F (36.6 C) 97.8 F (36.6 C)  97.7 F (36.5 C)  TempSrc: Oral Oral  Oral  SpO2: 95%  93% 97%  Weight:      Height:       No intake or output data in the 24 hours ending 08/05/23 1223 Filed Weights   08/02/23 1000 08/03/23 0900 08/04/23 1833  Weight: 66.9 kg 66.5 kg 62.2 kg   Weight Information (since admission)     Date/Time Weight Weight in lbs BSA (Calculated - sq m) BMI (Calculated) Who   08/04/23 1833 62.2 kg 137.19 lbs -- 23.54 SH   08/03/23 0900 66.5 kg 146.61 lbs -- 25.15 TM   08/02/23 1000 66.9 kg 147.49 lbs -- 25.3 TM   08/01/23 1700 64.5 kg 142.1 lbs -- 24.38 SH   08/01/23 0900 109.9 kg 242.29 lbs -- 41.57 KA   07/31/23 1800 64.5 kg 142.2 lbs -- 24.4 [facility]   07/29/23 1745 64.9 kg 143 lbs -- 23.8 VH   07/29/23 1715 64.9 kg 143 lbs 1.71 sq meters 24.53 VH   07/29/23 0520 66.5 kg 146.61 lbs -- 24.4 JR   07/28/23 1728 64.9 kg 143.1 lbs 1.73 sq meters 23.81 VN      Examination: Note that physical examination performed by paramedic on-scene and documented by provider Video Exam performed by video enabled technology  Physical Exam Vitals and nursing note reviewed.  Constitutional:      General: She is not in  acute distress.    Appearance: She is not toxic-appearing or diaphoretic.  HENT:     Head: Normocephalic and atraumatic.     Nose: Nose normal.  Eyes:     General: No scleral icterus. Cardiovascular:     Rate and Rhythm: Normal rate. Rhythm irregular.  Pulmonary:     Effort: Pulmonary effort is normal. No respiratory distress.     Breath sounds: No wheezing.  Abdominal:     General: Bowel sounds are normal.     Palpations: Abdomen is soft.  Musculoskeletal:     Right lower leg: Edema present.     Left lower leg: Edema present.     Comments: Trace LE edema  Less edema of left hand/left arm.  Skin:    Capillary Refill: Capillary refill takes less than 2 seconds.  Neurological:     General: No focal deficit present.     Mental Status: She is alert. She is disoriented.     Data Reviewed: I have personally reviewed following labs and imaging studies  CBC: Recent Labs  Lab 07/30/23 1131 08/03/23 1013  WBC 4.0 3.6*  NEUTROABS  --  2.5  HGB 10.4* 10.8*  HCT 35.4* 36.2  MCV 89.6 88.5  PLT 146* 149*   Basic Metabolic Panel: Recent Labs  Lab 07/31/23 1022 08/01/23 1258 08/02/23 1114 08/03/23 1013 08/04/23 1018  NA 138 140 136 137 138  K 4.3 4.2 4.2 4.3 3.9  CL 98 99 97* 98 95*  CO2 32 32 30 30 32  GLUCOSE 85 115* 116* 83 147*  BUN 36* 35* 36* 33* 32*  CREATININE 1.94* 1.76* 1.82* 1.73* 1.75*  CALCIUM  8.1* 8.1* 7.9* 8.2* 7.8*  MG  --   --   --   --  2.3   GFR: Estimated Creatinine Clearance: 15.9 mL/min (A) (by C-G formula based on SCr of 1.75 mg/dL (H)).  BNP (last 3 results) Recent Labs    06/22/23 1333 07/28/23 1402 08/03/23 1013  BNP 954.9* 822.1* 992.9*   Sepsis Labs: Recent Labs  Lab 08/03/23 1012  PROCALCITON <0.10    Radiology Studies: DG Chest 1 View Result Date: 08/03/2023 CLINICAL DATA:  Acute on chronic respiratory failure. EXAM: CHEST  1 VIEW COMPARISON:  Chest x-ray 07/28/2023 FINDINGS: Left-sided pacemaker is present. The heart is  enlarged. There are small bilateral pleural effusions and patchy bibasilar airspace opacities unchanged from prior. No pneumothorax visualized. The osseous structures are stable. IMPRESSION: Small bilateral pleural effusions and patchy bibasilar airspace opacities unchanged from prior. Electronically Signed   By: Greig Pique M.D.   On: 08/03/2023 17:27    Scheduled Meds:  amiodarone   100 mg Oral Daily   apixaban   2.5 mg Oral BID   diclofenac  Sodium  2 g Topical QID   fluticasone   2 spray Each Nare Daily   furosemide   60 mg Intravenous BID   iron  polysaccharides  150 mg Oral BID   melatonin  10 mg Oral QHS   metoprolol  tartrate  12.5 mg Oral BID   pantoprazole   40 mg Oral QPM   potassium chloride   40 mEq Oral BID   Continuous Infusions:  albumin  human 12.5 g (08/04/23 1841)     LOS: 8 days   Time spent: 60 minutes  Camellia Door, DO  [facility]  08/05/2023, 12:23 PM

## 2023-08-05 NOTE — Assessment & Plan Note (Signed)
 Prior to 08-05-2023. Prior iron  studies in 2022 showed iron  deficiency anemia. Pt remains on oral iron  supplements.  DC HgB of 10.8 Iron /TIBC/Ferritin/ %Sat    Component Value Date/Time   IRON  15 (L) 09/05/2020 1828   TIBC 416 09/05/2020 1828   FERRITIN 33 09/05/2020 1828   IRONPCTSAT 4 (L) 09/05/2020 1828

## 2023-08-05 NOTE — Progress Notes (Addendum)
 Communicated with patient via video tablet. Patient appears comfortable in the chair. PIV removed. AVS paperwork given to the patient. Discharge instructions discussed with the  patient and her daughter, [name]. Per Dr. Laurence, [facility] lab draw appt made by Rosaline Bane, NP for 08/07/23 at her primary cardiology office. Patient and family agreeable to discharge plan.

## 2023-08-05 NOTE — Subjective & Objective (Signed)
 Pt seen during video visit. Paramedic Sarah present. Pt declined to get on scale for a weight check stating she was tired this AM. Pt stated she slept well last night. No DOE today. Paramedic states that left hand/arm edema has improved. Similar trace pretibial edema bilateral LE.  Able to arrange for the patient's granddaughter Allison Norman to be present on the video call as well.  I relayed to the patient and her granddaughter that I spoke personally with Dr. Fausto this morning.  1 to be sure that what was conveyed to me verbally by Dr. Fausto   in summary, patient does not want to jeopardize her renal function with aggressive IV diuresis.  She states that she wants to let her faith guide her in that God will do what he wants with her.  Patient is not ready for hospice care.  She wants to remain a DNR.  Patient agreement to receive 1 final dose of IV Lasix .  60 mg this morning.  Patient does not want any further labs drawn.  Patient and the granddaughter Allison are both in agreement to discharge from the hospital at home program later on today.  Will discharge her on 60 mg of Lasix  twice a day.  Patient will follow-up and get a repeat BMP in the cardiology office on Friday.  I will attempt to arrange this with their outpatient cardiology APP.  The granddaughter had voiced concerns that patient did not like her medications being changed.  Also discussed changing her beta-blocker over to Lopressor  12.5 mg twice daily to give her blood pressure more room for diuresis with p.o. Lasix  instead of using Coreg .  Patient stated that she was agreeable to these changes.

## 2023-08-05 NOTE — Progress Notes (Signed)
 [facility] [place] Liaison Note:  Notified by [facility] manager of patient/family request for [facility]iative services at home after discharge.   Please call with any hospice or outpatient palliative care related questions.   Elouise Husband, BSN, RN, OCN [employer] [phone]

## 2023-08-05 NOTE — Discharge Summary (Signed)
 [facility] Physician Discharge Summary   Patient name: Allison Norman  Admit date:     07/28/2023  Discharge date: 08/05/2023  Attending Physician: GEORGINA BASKET [[id number]]  Discharge Physician: Camellia Door   PCP: Okey Carlin Redbird, MD  Admitted From: Home  Disposition:  Home  Recommendations for Outpatient Follow-up:  Follow up with PCP in 1-2 weeks Follow up with cardiology as outpatient Get repeat BMP this Friday, August 07, 2023 at cardiology office Outpatient referral to palliative care([facility])  Home Health:No Equipment/Devices: Oxygen  2 L/min. Has chronic O2 at home  Discharge Condition:Stable CODE STATUS:DNR/DNI Diet recommendation: Heart Healthy Fluid Restriction: 60 ounces per day  Hospital Summary: HPI: Allison Norman is a 89 y.o. female with medical history significant of Afib s/p DCCV in 2017 with re-occurrence in 2018; now rate controlled, HTN, CKD3b, and breast/skin cancer p/w HFpEF exacerbation and AKI.   Pt reports being in her USOH until this past week. Per pt her breathing and LE edema has worsened. Pt notes that she does not eat fast food, and only limits her self to a few snacks (cheese and crackers). Of note, pt was evaluated by her OP cards on 7/17 and has been on increasing doses of diuretics for some time with little to no relief; in fact, PO lasix  40mg  daily increased to 80mg  daily, before being switched to torsemide  40mg  daily and recently increased to torsemide  80mg  daily.   In the ED, pt tachypneic on 2L [place]. Labs notable for Cr 2.09 (Cr 1.77 on 7/7), and BNP 822. CXR showed Small bilateral pleural effusions with bibasilar atelectasis. EDP ordered IV lasix  60mg  and requested medicine admission for ongoing care.    Significant Events: Admitted 07/28/2023 for acute on chronic diastolic CHF 07-29-2023 Pt transferred to [facility] program. As a part of hospital home prescreening, palliative care was formally consulted.  Patient as well as  family.  In discussion with palliative care, family were agreeable to trial of diuresis with IV Lasix  with tentative plan for home hospice if no clinical improvement versus outpatient palliative care once medically optimized.  Code Status Changed to DNR/DNI 07-30-2023 IV lasix  dose reduced from 80 mg-->40 mg bid due to soft Bps. coreg  25 mg--> 6.25 mg bid to give BP more room for diuresis. Prn midodrine  added for low BP prior to IV lasix  dose. Left UE  U/S considered to evaluate left UE swelling but family declined U/S after management discussion with attending 07-31-2023 pt celebrated 89th birthday at home with her family 08-01-2023 weight decreased from 66.6 kg --> 64.5 kg 08-03-2023 pt transported for CXR 08-04-2023 IV lasix  increased from 40 mg bid --> 60 mg BID. Given IV albumin  prior to IV lasix . H@H  hospitalist made house call to see patient.  Pt wishes to further improve her edema, understanding escalating efforts to diurese could cause her kidneys to fail or complications of low BP's.   Admission Labs: WBC 4.7, HgB 10.9, plt 155 Na 138, K 3.8, CO2 of 28, BUJN 41, Scr 2.09, glu 108 T. Prot 6.3, alb 2.7, AST 23, ALT 15, alk phos 99. T. Bili 1.1 BNP 822  Admission Imaging Studies: CXR Small bilateral pleural effusions with bibasilar atelectasis. 2. Cardiomegaly with findings suggestive of interstitial pulmonary edema.  Significant Labs: 08-03-2023 procal < 0.10 08-03-2023 BNP 992  Significant Imaging Studies: 08-03-2023 Small bilateral pleural effusions and patchy bibasilar airspace opacities unchanged from prior.  Left-sided pacemaker is present   Antibiotic Therapy: Anti-infectives (From admission, onward)  None       Procedures:   Consultants: Palliative care   Hospital Course by Problem: * Acute on chronic diastolic CHF (congestive heart failure) (HCC) 08-05-2023 pt has been on IV diuresis for the last 7-8 days. About 6 lb drop in weight(at least as of yesterday). Pt  refused to get weighed this AM.  Pt stated she slept well last night. No DOE today. Paramedic states that left hand/arm edema has improved. Similar trace pretibial edema bilateral LE.   Able to arrange for the patient's granddaughter Allison Norman to be present on the video call as well.  I relayed to the patient and her granddaughter that I spoke personally with Dr. Fausto this morning.  1 to be sure that what was conveyed to me verbally by Dr. Fausto   in summary, patient does not want to jeopardize her renal function with aggressive IV diuresis.  She states that she wants to let her faith guide her in that God will do what he wants with her.  Patient is not ready for hospice care.  She wants to remain a DNR/DNI.  Patient agreement to receive 1 final dose of IV Lasix .  60 mg this morning.  Patient does not want any further labs drawn.  Patient and the granddaughter Allison are both in agreement to discharge from the hospital at home program later on today.  Will discharge her on 60 mg of Lasix  twice a day.  Patient will follow-up and get a repeat BMP in the cardiology office on Friday.  I will attempt to arrange this with their outpatient cardiology APP.  The granddaughter had voiced concerns that patient did not like her medications being changed.  Also discussed changing her beta-blocker over to Lopressor  12.5 mg twice daily to give her blood pressure more room for diuresis with p.o. Lasix  instead of using Coreg .  Patient stated that she was agreeable to these changes.  Will send referral to outpatient palliative care.   *update. Discharge weight 136.5 lbs. Admission weight is 146.6 lbs.  Acute on chronic respiratory failure with hypoxia (HCC) - chronically on 2 L/min At baseline, on 2L/min [place] chronically.  Note findings of of pulmonary hypertension on prior CT chest.  Oxygen  needs remain at 4 L/min with O2 sats in low/mid 90's, dyspnea due to CHF decompensation.  7/26 attempted wean to 3 L/min,  but spO2 desatted 7/27--29 - remains on 4 L/min O2 Chest-xray repeated 7/28 -- similar CHF findings, BNP still elevated. Procal negative & no focal infiltrates, less likely infectious component --Continue diuresis as above --Supplement O2 for goal spO2 > 90%, wean as tolerated --Diuresis escalated as above 08-05-2023 pt will continue on 2-4 L/min O2. Paramedic reports lung sounds are clear and no rales.  Edema due to hypoalbuminemia 08-05-2023 pt received 1 dose of IV albumin  to help augment her diuresis. Based on conversation with pt and her grand-dtr today, pt does not want to risk any further worsening of her renal function in order to gain additional diuresis with IV diuretics. She will be discharged from [facility] program today. Lasix  60 mg bid. F/u with repeat BMP this Friday. F/u with outpatient cards for further outpatient management of her chronic diastolic CHF.  CKD (chronic kidney disease), stage IV (HCC) - baseline scr 1.7-2.0 Prior to 08-05-2023 Cr on admission 2.09 >> 2.04 >> slightly improving  Cr 1.95 >> 1.94 >> 1.76 >> 1.82 >> 1.73 >> 1.75  08-05-2023 pt declined to have any further labs  drawn. Wants to go to cards office this Friday for repeat BMP. I have sent message to cards APP that saw in the office this month to have this arranged.  Pulmonary hypertension, unspecified (HCC) Prior to 08-05-2023 Note findings of of pulmonary hypertension on prior CT chest.  08-05-2023 pt should f/u with cards regarding findings on CT chest  Iron  deficiency anemia Prior to 08-05-2023. Prior iron  studies in 2022 showed iron  deficiency anemia. Pt remains on oral iron  supplements.  DC HgB of 10.8 Iron /TIBC/Ferritin/ %Sat    Component Value Date/Time   IRON  15 (L) 09/05/2020 1828   TIBC 416 09/05/2020 1828   FERRITIN 33 09/05/2020 1828   IRONPCTSAT 4 (L) 09/05/2020 1828     Persistent atrial fibrillation (HCC) Prior history of cardioversions but appears to have been in  persistent A-fib for years now 7/28 to 7-29 -- intermittent HR elevations, suspect due to lower dose Coreg  --Continue amiodarone , carvedilol  and Eliquis  --Coreg  reduced 25 >> 6.25 mg due to soft BP's with diuresis.    08-05-2023 pt remains on eliquis  and amiodarone . Changed to lopressor  12.5 mg bid. DC coreg .  Essential hypertension Prior to 08-05-2023. Pt's coreg  dose reduced from 25 mg bid -->6.25 mg bid in order to give her BP more room for diuresis. Prn midodrine  was also added to help boost her BP prior to any doses of IV lasix . Fortunately, she did not have to use any prn midodrine .  08-05-2023 after further discussion with pharmacy, her beta blocker will be changed to lopressor  12.5 mg bid instead of coreg . This should control her afib heart rate but not drop her blood pressure so much.  The granddaughter had voiced concerns that patient did not like her medications being changed. Also discussed changing her beta-blocker over to Lopressor  12.5 mg twice daily to give her blood pressure more room for diuresis with p.o. Lasix  instead of using Coreg . Patient stated that she was agreeable to these changes.     Discharge Diagnoses:  Principal Problem:   Acute on chronic diastolic CHF (congestive heart failure) (HCC) Active Problems:   Acute on chronic respiratory failure with hypoxia (HCC) - chronically on 2 L/min   CKD (chronic kidney disease), stage IV (HCC) - baseline scr 1.7-2.0   Edema due to hypoalbuminemia   Essential hypertension   Persistent atrial fibrillation (HCC)   Iron  deficiency anemia   Pulmonary hypertension, unspecified (HCC)   Discharge Instructions  Discharge Instructions     (HEART FAILURE PATIENTS) Call MD:  Anytime you have any of the following symptoms: 1) 3 pound weight gain in 24 hours or 5 pounds in 1 week 2) shortness of breath, with or without a dry hacking cough 3) swelling in the hands, feet or stomach 4) if you have to sleep on extra pillows at night in  order to breathe.   Complete by: As directed    Amb Referral to Palliative Care   Complete by: As directed    Call MD for:  difficulty breathing, headache or visual disturbances   Complete by: As directed    Call MD for:  extreme fatigue   Complete by: As directed    Call MD for:  hives   Complete by: As directed    Call MD for:  persistant dizziness or light-headedness   Complete by: As directed    Call MD for:  persistant nausea and vomiting   Complete by: As directed    Call MD for:  redness, tenderness, or signs of  infection (pain, swelling, redness, odor or green/yellow discharge around incision site)   Complete by: As directed    Call MD for:  severe uncontrolled pain   Complete by: As directed    Call MD for:  temperature >100.4   Complete by: As directed    Diet - low sodium heart healthy   Complete by: As directed    60 ounce per day fluid restriction   Discharge instructions   Complete by: As directed    1. Follow up with your primary care provider in 1-2 weeks following discharge from hospital. 2. Follow up with cardiology office this Friday, August 07, 2023 to have blood work drawn. 3. Outpatient referral made to palliative care.   Increase activity slowly   Complete by: As directed       Allergies as of 08/05/2023       Reactions   Codeine Itching   Penicillins Itching, Rash   Apresoline  [hydralazine ] Other (See Comments)   Felt bad Could hear/feel pulse in head Tinnitus    Cardura  [doxazosin ] Other (See Comments)   Congestion Wheezing Heavy feeling in chest   Vibramycin [doxycycline] Other (See Comments)   Chest tightness         Medication List     STOP taking these medications    carvedilol  25 MG tablet Commonly known as: COREG    torsemide  20 MG tablet Commonly known as: DEMADEX        TAKE these medications    amiodarone  200 MG tablet Commonly known as: PACERONE  Take 1/2 (one-half) tablet by mouth once daily   diazepam  2 MG  tablet Commonly known as: VALIUM  Take 2 mg by mouth 2 (two) times daily as needed for anxiety (sleep).   Eliquis  2.5 MG Tabs tablet Generic drug: apixaban  Take 1 tablet by mouth twice daily   Ferrex 150 150 MG capsule Generic drug: iron  polysaccharides Take 150 mg by mouth 2 (two) times daily.   furosemide  40 MG tablet Commonly known as: Lasix  Take 1.5 tablets (60 mg total) by mouth 2 (two) times daily.   metoprolol  tartrate 25 MG tablet Commonly known as: LOPRESSOR  Take 0.5 tablets (12.5 mg total) by mouth 2 (two) times daily.   pantoprazole  40 MG tablet Commonly known as: Protonix  Take 1 tablet (40 mg total) by mouth every evening. What changed: when to take this   potassium chloride  20 MEQ packet Commonly known as: KLOR-CON  Take 40 mEq by mouth daily.   valsartan  160 MG tablet Commonly known as: DIOVAN  Take 160 mg by mouth See admin instructions. Take 1 tablet (160mg ) by mouth in the evening as needed for sBP > 160.        Follow-up Information     Okey Carlin Redbird, MD. Go on 08/13/2023.   Specialty: Family Medicine Why: Time 1030,  Hospital Follow-up, Please arrive 15 min early to complete paperwork Contact information: [address] [place] [address] [phone]         Care, [facility] Follow up.   Specialty: Home Health Services Why: They will call 24-48 hours post discharge to set up services, PT and RN Contact information: [address] [place] [address] [phone]         Swinyer, Rosaline HERO, NP .   Specialty: Cardiology Contact information: [address] [place] [address] [phone]                Allergies  Allergen Reactions   Codeine Itching  Penicillins Itching and Rash   Apresoline  [Hydralazine ] Other (See Comments)    Felt bad Could hear/feel pulse in head Tinnitus    Cardura  [Doxazosin ] Other (See Comments)    Congestion Wheezing Heavy feeling in chest    Vibramycin [Doxycycline] Other (See Comments)    Chest tightness     Discharge Exam: Vitals:   08/05/23 0900 08/05/23 1400  BP: 113/78 120/74  Pulse: 90 86  Resp: 20 20  Temp: 97.7 F (36.5 C) 97.6 F (36.4 C)  SpO2: 97% 96%   Weight Information (last 9 days) before discharge     Date/Time Weight Weight in lbs BSA (Calculated - sq m) BMI (Calculated) Who   08/05/23 1400 61.9 kg 136.5 lbs -- 23.42 [facility]   08/04/23 1833 62.2 kg 137.19 lbs -- 23.54 SH   08/03/23 0900 66.5 kg 146.61 lbs -- 25.15 TM   08/02/23 1000 66.9 kg 147.49 lbs -- 25.3 TM   08/01/23 1700 64.5 kg 142.1 lbs -- 24.38 SH   08/01/23 0900 109.9 kg 242.29 lbs -- 41.57 KA   07/31/23 1800 64.5 kg 142.2 lbs -- 24.4 [facility]   07/29/23 1745 64.9 kg 143 lbs -- 23.8 VH   07/29/23 1715 64.9 kg 143 lbs 1.71 sq meters 24.53 VH   07/29/23 0520 66.5 kg 146.61 lbs -- 24.4 JR   07/28/23 1728 64.9 kg 143.1 lbs 1.73 sq meters 23.81 VN      Exam performed by Lauraine, (paramedic) but documented by this writer  Physical Exam Vitals and nursing note reviewed.  Constitutional:      General: She is not in acute distress.    Appearance: She is not toxic-appearing or diaphoretic.  HENT:     Head: Normocephalic and atraumatic.     Nose: Nose normal.  Eyes:     General: No scleral icterus. Cardiovascular:     Rate and Rhythm: Normal rate. Rhythm irregular.  Pulmonary:     Effort: Pulmonary effort is normal. No respiratory distress.     Breath sounds: No wheezing.  Abdominal:     General: Bowel sounds are normal.     Palpations: Abdomen is soft.  Musculoskeletal:     Right lower leg: Edema present.     Left lower leg: Edema present.     Comments: Trace LE edema  Less edema of left hand/left arm.  Skin:    Capillary Refill: Capillary refill takes less than 2 seconds.  Neurological:     General: No focal deficit present.     Mental Status: She is alert. She is disoriented.    The results of significant diagnostics from this  hospitalization (including imaging, microbiology, ancillary and laboratory) are listed below for reference.    Labs: BNP (last 3 results) Recent Labs    06/22/23 1333 07/28/23 1402 08/03/23 1013  BNP 954.9* 822.1* 992.9*   Basic Metabolic Panel: Recent Labs  Lab 07/31/23 1022 08/01/23 1258 08/02/23 1114 08/03/23 1013 08/04/23 1018  NA 138 140 136 137 138  K 4.3 4.2 4.2 4.3 3.9  CL 98 99 97* 98 95*  CO2 32 32 30 30 32  GLUCOSE 85 115* 116* 83 147*  BUN 36* 35* 36* 33* 32*  CREATININE 1.94* 1.76* 1.82* 1.73* 1.75*  CALCIUM  8.1* 8.1* 7.9* 8.2* 7.8*  MG  --   --   --   --  2.3   CBC: Recent Labs  Lab 07/30/23 1131 08/03/23 1013  WBC 4.0 3.6*  NEUTROABS  --  2.5  HGB 10.4* 10.8*  HCT 35.4* 36.2  MCV 89.6 88.5  PLT 146* 149*   BNP: Recent Labs  Lab 08/03/23 1013  BNP 992.9*   Sepsis Labs Recent Labs  Lab 07/30/23 1131 08/03/23 1012 08/03/23 1013  PROCALCITON  --  <0.10  --   WBC 4.0  --  3.6*    Procedures/Studies: DG Chest 1 View Result Date: 08/03/2023 CLINICAL DATA:  Acute on chronic respiratory failure. EXAM: CHEST  1 VIEW COMPARISON:  Chest x-ray 07/28/2023 FINDINGS: Left-sided pacemaker is present. The heart is enlarged. There are small bilateral pleural effusions and patchy bibasilar airspace opacities unchanged from prior. No pneumothorax visualized. The osseous structures are stable. IMPRESSION: Small bilateral pleural effusions and patchy bibasilar airspace opacities unchanged from prior. Electronically Signed   By: Greig Pique M.D.   On: 08/03/2023 17:27   DG Chest Portable 1 View Result Date: 07/28/2023 CLINICAL DATA:  Shortness of breath. EXAM: PORTABLE CHEST 1 VIEW COMPARISON:  03/09/2023. FINDINGS: Stable cardiomegaly. Stable left chest wall pacemaker. Small bilateral pleural effusions with bibasilar atelectasis. Bilateral interstitial prominence, most pronounced at the lung bases, suspicious for interstitial pulmonary edema. No pneumothorax.  Diffuse osseous demineralization. No acute osseous abnormality. IMPRESSION: 1. Small bilateral pleural effusions with bibasilar atelectasis. 2. Cardiomegaly with findings suggestive of interstitial pulmonary edema. Electronically Signed   By: Harrietta Sherry M.D.   On: 07/28/2023 13:35   CUP PACEART REMOTE DEVICE CHECK Result Date: 07/18/2023 PPM Scheduled remote reviewed. Normal device function.  Presenting rhythm:  AFL-VS/VP.  Known history of PAF, Eliquis  per Epic.  Overall AF burden 33%, however, has been 100% since May. Routing to triage for presenting AFL, persistent for > 30 days and not previously reported per protocol. Next remote 91 days. - CS, CVRS   Time coordinating discharge: 60 mins  SIGNED:  Camellia Door, DO [facility] 08/05/23, 4:04 PM

## 2023-08-05 NOTE — TOC Progression Note (Signed)
 Transition of Care [facility]) - Progression Note    Patient Details  Name: Allison Norman MRN: [id number] Date of Birth: Mar 12, 1924  [facility]) CM/SW Contact  Debarah Saunas, RN Phone Number: 08/05/2023, 12:12 PM  Clinical Narrative:    89 yo female admitted INPATIENT for Acute on Chronic Diastolic Congestive Heart Failure. RNCM consulted regarding outpatient palliative consult to [facility].  RNCM notified by grand daughter, [name], RN on 7/28 that [facility] is who they will choose for Home Palliative Care.  RNCM contacted Shawn P., RN with [facility] to place referral; Shawn accepted.  Shawn to reach out to family to discuss next steps.     Expected Discharge Plan: Home/Self Care (24 hour private care) Barriers to Discharge: Continued Medical Work up               Expected Discharge Plan and Services In-house Referral: Hospice / Palliative Care [facility]) Discharge Planning Services: CM Consult, Follow-up appt scheduled   Living arrangements for the past 2 months: Single Family Home                   DME Agency: NA       HH Arranged: RN, PT   Date HH Agency Contacted: 08/01/23 Time HH Agency Contacted: 1600 Representative spoke with at [facility] Agency: Darleene Gowda   Social Drivers of Health (SDOH) Interventions SDOH Screenings   Food Insecurity: No Food Insecurity (07/29/2023)  Housing: Low Risk  (07/29/2023)  Transportation Needs: No Transportation Needs (07/29/2023)  Utilities: Not At Risk (07/29/2023)  Social Connections: Socially Isolated (07/29/2023)  Tobacco Use: Medium Risk (07/29/2023)    Readmission Risk Interventions    07/31/2023   11:26 AM 07/29/2023    2:54 PM 07/29/2023   10:12 AM  Readmission Risk Prevention Plan  Transportation Screening Complete Complete   PCP or Specialist Appt within 5-7 Days Complete    Home Care Screening Complete Complete Complete  Medication Review (RN CM) Complete Complete Complete

## 2023-08-05 NOTE — Assessment & Plan Note (Signed)
 At baseline, on 2L/min [place] chronically.  Note findings of of pulmonary hypertension on prior CT chest.  Oxygen  needs remain at 4 L/min with O2 sats in low/mid 90's, dyspnea due to CHF decompensation.  7/26 attempted wean to 3 L/min, but spO2 desatted 7/27--29 - remains on 4 L/min O2 Chest-xray repeated 7/28 -- similar CHF findings, BNP still elevated. Procal negative & no focal infiltrates, less likely infectious component --Continue diuresis as above --Supplement O2 for goal spO2 > 90%, wean as tolerated --Diuresis escalated as above 08-05-2023 pt will continue on 2-4 L/min O2. Paramedic reports lung sounds are clear and no rales.

## 2023-08-05 NOTE — Assessment & Plan Note (Signed)
 08-05-2023 pt received 1 dose of IV albumin  to help augment her diuresis. Based on conversation with pt and her grand-dtr today, pt does not want to risk any further worsening of her renal function in order to gain additional diuresis with IV diuretics. She will be discharged from [facility] program today. Lasix  60 mg bid. F/u with repeat BMP this Friday. F/u with outpatient cards for further outpatient management of her chronic diastolic CHF.

## 2023-08-05 NOTE — Assessment & Plan Note (Signed)
 Prior history of cardioversions but appears to have been in persistent A-fib for years now 7/28 to 7-29 -- intermittent HR elevations, suspect due to lower dose Coreg  --Continue amiodarone , carvedilol  and Eliquis  --Coreg  reduced 25 >> 6.25 mg due to soft BP's with diuresis.    08-05-2023 pt remains on eliquis  and amiodarone . Changed to lopressor  12.5 mg bid. DC coreg .

## 2023-08-05 NOTE — Progress Notes (Addendum)
 0109--0114--- Current Health data has not transmitted data across 40 mins. No alerts generated. Message sent to Current Health Tech. Outbound call to Caregiver who confirmed patient is stable, equipment is working, and wearable is in correct place and position. Will continue to monitor updates from Current Health technician's troubleshooting outcome.    61- Current health dashboard now showing out of range. Outbound call to patient/caregiver, reassessment of patient, equipment, location, placement and wearable. Caregiver confirmed hub is within range.  Caregiver confirmed patient is still stable and will take pulse ox with patient's next bathroom use and will call RN with results or with any non-emergent concerns. Updated current health technician who completed additional troubleshooting and confirmed data will slowly backfill and will catch up to current time. SPO2 still shows lagging data. Will continue to monitor Resp rate, Pulse, and Skin Temp for significant changes.   0234--Inbound call received from caregiver to report pulse ox reading from patient's home pulse ox. SPO2 93% (4L)  Pulse 89 (Current Health data reading SPO2 as 88% and Pulse 77). Caregiver confirmed patient is stable, and no concerns reported at time of call.

## 2023-08-05 NOTE — Assessment & Plan Note (Signed)
 Prior to 08-05-2023. Pt's coreg  dose reduced from 25 mg bid -->6.25 mg bid in order to give her BP more room for diuresis. Prn midodrine  was also added to help boost her BP prior to any doses of IV lasix . Fortunately, she did not have to use any prn midodrine .  08-05-2023 after further discussion with pharmacy, her beta blocker will be changed to lopressor  12.5 mg bid instead of coreg . This should control her afib heart rate but not drop her blood pressure so much.  The granddaughter had voiced concerns that patient did not like her medications being changed. Also discussed changing her beta-blocker over to Lopressor  12.5 mg twice daily to give her blood pressure more room for diuresis with p.o. Lasix  instead of using Coreg . Patient stated that she was agreeable to these changes.

## 2023-08-05 NOTE — Hospital Course (Addendum)
 HPI: Allison Norman is a 89 y.o. female with medical history significant of Afib s/p DCCV in 2017 with re-occurrence in 2018; now rate controlled, HTN, CKD3b, and breast/skin cancer p/w HFpEF exacerbation and AKI.   Pt reports being in her USOH until this past week. Per pt her breathing and LE edema has worsened. Pt notes that she does not eat fast food, and only limits her self to a few snacks (cheese and crackers). Of note, pt was evaluated by her OP cards on 7/17 and has been on increasing doses of diuretics for some time with little to no relief; in fact, PO lasix  40mg  daily increased to 80mg  daily, before being switched to torsemide  40mg  daily and recently increased to torsemide  80mg  daily.   In the ED, pt tachypneic on 2L [place]. Labs notable for Cr 2.09 (Cr 1.77 on 7/7), and BNP 822. CXR showed Small bilateral pleural effusions with bibasilar atelectasis. EDP ordered IV lasix  60mg  and requested medicine admission for ongoing care.    Significant Events: Admitted 07/28/2023 for acute on chronic diastolic CHF 07-29-2023 Pt transferred to [facility] program. As a part of hospital home prescreening, palliative care was formally consulted.  Patient as well as family.  In discussion with palliative care, family were agreeable to trial of diuresis with IV Lasix  with tentative plan for home hospice if no clinical improvement versus outpatient palliative care once medically optimized.  Code Status Changed to DNR/DNI 07-30-2023 IV lasix  dose reduced from 80 mg-->40 mg bid due to soft Bps. coreg  25 mg--> 6.25 mg bid to give BP more room for diuresis. Prn midodrine  added for low BP prior to IV lasix  dose. Left UE  U/S considered to evaluate left UE swelling but family declined U/S after management discussion with attending 07-31-2023 pt celebrated 89th birthday at home with her family 08-01-2023 weight decreased from 66.6 kg --> 64.5 kg 08-03-2023 pt transported for CXR 08-04-2023 IV lasix  increased from 40 mg  bid --> 60 mg BID. Given IV albumin  prior to IV lasix . H@H  hospitalist made house call to see patient.  Pt wishes to further improve her edema, understanding escalating efforts to diurese could cause her kidneys to fail or complications of low BP's.   Admission Labs: WBC 4.7, HgB 10.9, plt 155 Na 138, K 3.8, CO2 of 28, BUJN 41, Scr 2.09, glu 108 T. Prot 6.3, alb 2.7, AST 23, ALT 15, alk phos 99. T. Bili 1.1 BNP 822  Admission Imaging Studies: CXR Small bilateral pleural effusions with bibasilar atelectasis. 2. Cardiomegaly with findings suggestive of interstitial pulmonary edema.  Significant Labs: 08-03-2023 procal < 0.10 08-03-2023 BNP 992  Significant Imaging Studies: 08-03-2023 Small bilateral pleural effusions and patchy bibasilar airspace opacities unchanged from prior.  Left-sided pacemaker is present   Antibiotic Therapy: Anti-infectives (From admission, onward)    None       Procedures:   Consultants: Palliative care

## 2023-08-05 NOTE — Assessment & Plan Note (Addendum)
 08-05-2023 pt has been on IV diuresis for the last 7-8 days. About 6 lb drop in weight(at least as of yesterday). Pt refused to get weighed this AM.  Pt stated she slept well last night. No DOE today. Paramedic states that left hand/arm edema has improved. Similar trace pretibial edema bilateral LE.   Able to arrange for the patient's granddaughter Allison Norman to be present on the video call as well.  I relayed to the patient and her granddaughter that I spoke personally with Dr. Fausto this morning.  1 to be sure that what was conveyed to me verbally by Dr. Fausto   in summary, patient does not want to jeopardize her renal function with aggressive IV diuresis.  She states that she wants to let her faith guide her in that God will do what he wants with her.  Patient is not ready for hospice care.  She wants to remain a DNR/DNI.  Patient agreement to receive 1 final dose of IV Lasix .  60 mg this morning.  Patient does not want any further labs drawn.  Patient and the granddaughter Allison are both in agreement to discharge from the hospital at home program later on today.  Will discharge her on 60 mg of Lasix  twice a day.  Patient will follow-up and get a repeat BMP in the cardiology office on Friday.  I will attempt to arrange this with their outpatient cardiology APP.  The granddaughter had voiced concerns that patient did not like her medications being changed.  Also discussed changing her beta-blocker over to Lopressor  12.5 mg twice daily to give her blood pressure more room for diuresis with p.o. Lasix  instead of using Coreg .  Patient stated that she was agreeable to these changes.  Will send referral to outpatient palliative care.   *update. Discharge weight 136.5 lbs. Admission weight is 146.6 lbs.

## 2023-08-05 NOTE — Assessment & Plan Note (Signed)
 Prior to 08-05-2023 Note findings of of pulmonary hypertension on prior CT chest.  08-05-2023 pt should f/u with cards regarding findings on CT chest

## 2023-08-05 NOTE — Plan of Care (Signed)

## 2023-08-05 NOTE — Assessment & Plan Note (Signed)
 Prior to 08-05-2023 Cr on admission 2.09 >> 2.04 >> slightly improving  Cr 1.95 >> 1.94 >> 1.76 >> 1.82 >> 1.73 >> 1.75  08-05-2023 pt declined to have any further labs drawn. Wants to go to cards office this Friday for repeat BMP. I have sent message to cards APP that saw in the office this month to have this arranged.

## 2023-08-05 NOTE — Progress Notes (Signed)
 Spoke to the patient on the phone. She reports feeling the same overall. Tenderness to the left elbow ongoing. Patient states she is eating and drinking adequately. Oxygen  on at 4L. Plan for paramedics arrival to house between 0930-1000, pt made aware.   @1005 : Communicated with patient via video tablet. Dr. Laurence and pt's granddaughter, Allison Norman present for virtual call. Plan of care discussed. Possible discharge this evening.

## 2023-08-06 DIAGNOSIS — N179 Acute kidney failure, unspecified: Secondary | ICD-10-CM | POA: Diagnosis not present

## 2023-08-06 DIAGNOSIS — D631 Anemia in chronic kidney disease: Secondary | ICD-10-CM | POA: Diagnosis not present

## 2023-08-06 DIAGNOSIS — N184 Chronic kidney disease, stage 4 (severe): Secondary | ICD-10-CM | POA: Diagnosis not present

## 2023-08-06 DIAGNOSIS — I130 Hypertensive heart and chronic kidney disease with heart failure and stage 1 through stage 4 chronic kidney disease, or unspecified chronic kidney disease: Secondary | ICD-10-CM | POA: Diagnosis not present

## 2023-08-06 DIAGNOSIS — I272 Pulmonary hypertension, unspecified: Secondary | ICD-10-CM | POA: Diagnosis not present

## 2023-08-06 DIAGNOSIS — I480 Paroxysmal atrial fibrillation: Secondary | ICD-10-CM | POA: Diagnosis not present

## 2023-08-06 DIAGNOSIS — Z950 Presence of cardiac pacemaker: Secondary | ICD-10-CM | POA: Diagnosis not present

## 2023-08-06 DIAGNOSIS — Z9181 History of falling: Secondary | ICD-10-CM | POA: Diagnosis not present

## 2023-08-06 DIAGNOSIS — I5033 Acute on chronic diastolic (congestive) heart failure: Secondary | ICD-10-CM | POA: Diagnosis not present

## 2023-08-06 DIAGNOSIS — D509 Iron deficiency anemia, unspecified: Secondary | ICD-10-CM | POA: Diagnosis not present

## 2023-08-06 DIAGNOSIS — Z85828 Personal history of other malignant neoplasm of skin: Secondary | ICD-10-CM | POA: Diagnosis not present

## 2023-08-06 DIAGNOSIS — Z7901 Long term (current) use of anticoagulants: Secondary | ICD-10-CM | POA: Diagnosis not present

## 2023-08-06 DIAGNOSIS — J9621 Acute and chronic respiratory failure with hypoxia: Secondary | ICD-10-CM | POA: Diagnosis not present

## 2023-08-06 DIAGNOSIS — Z853 Personal history of malignant neoplasm of breast: Secondary | ICD-10-CM | POA: Diagnosis not present

## 2023-08-06 DIAGNOSIS — J9811 Atelectasis: Secondary | ICD-10-CM | POA: Diagnosis not present

## 2023-08-06 DIAGNOSIS — Z9981 Dependence on supplemental oxygen: Secondary | ICD-10-CM | POA: Diagnosis not present

## 2023-08-10 DIAGNOSIS — R600 Localized edema: Secondary | ICD-10-CM | POA: Diagnosis not present

## 2023-08-10 DIAGNOSIS — I5032 Chronic diastolic (congestive) heart failure: Secondary | ICD-10-CM | POA: Diagnosis not present

## 2023-08-10 DIAGNOSIS — N1832 Chronic kidney disease, stage 3b: Secondary | ICD-10-CM | POA: Diagnosis not present

## 2023-08-11 ENCOUNTER — Ambulatory Visit: Payer: Self-pay | Admitting: Nurse Practitioner

## 2023-08-11 LAB — BASIC METABOLIC PANEL WITH GFR
BUN/Creatinine Ratio: 20 (ref 12–28)
BUN: 32 mg/dL (ref 10–36)
CO2: 27 mmol/L (ref 20–29)
Calcium: 8.5 mg/dL — AB (ref 8.7–10.3)
Chloride: 98 mmol/L (ref 96–106)
Creatinine, Ser: 1.6 mg/dL — AB (ref 0.57–1.00)
Glucose: 97 mg/dL (ref 70–99)
Potassium: 4.8 mmol/L (ref 3.5–5.2)
Sodium: 141 mmol/L (ref 134–144)
eGFR: 29 mL/min/1.73 — AB (ref >59)

## 2023-08-12 DIAGNOSIS — J9621 Acute and chronic respiratory failure with hypoxia: Secondary | ICD-10-CM | POA: Diagnosis not present

## 2023-08-12 DIAGNOSIS — I5033 Acute on chronic diastolic (congestive) heart failure: Secondary | ICD-10-CM | POA: Diagnosis not present

## 2023-08-12 DIAGNOSIS — I480 Paroxysmal atrial fibrillation: Secondary | ICD-10-CM | POA: Diagnosis not present

## 2023-08-12 DIAGNOSIS — I130 Hypertensive heart and chronic kidney disease with heart failure and stage 1 through stage 4 chronic kidney disease, or unspecified chronic kidney disease: Secondary | ICD-10-CM | POA: Diagnosis not present

## 2023-08-12 DIAGNOSIS — N184 Chronic kidney disease, stage 4 (severe): Secondary | ICD-10-CM | POA: Diagnosis not present

## 2023-08-12 DIAGNOSIS — D631 Anemia in chronic kidney disease: Secondary | ICD-10-CM | POA: Diagnosis not present

## 2023-08-18 DIAGNOSIS — J9621 Acute and chronic respiratory failure with hypoxia: Secondary | ICD-10-CM | POA: Diagnosis not present

## 2023-08-18 DIAGNOSIS — D631 Anemia in chronic kidney disease: Secondary | ICD-10-CM | POA: Diagnosis not present

## 2023-08-18 DIAGNOSIS — I130 Hypertensive heart and chronic kidney disease with heart failure and stage 1 through stage 4 chronic kidney disease, or unspecified chronic kidney disease: Secondary | ICD-10-CM | POA: Diagnosis not present

## 2023-08-18 DIAGNOSIS — I480 Paroxysmal atrial fibrillation: Secondary | ICD-10-CM | POA: Diagnosis not present

## 2023-08-18 DIAGNOSIS — N184 Chronic kidney disease, stage 4 (severe): Secondary | ICD-10-CM | POA: Diagnosis not present

## 2023-08-18 DIAGNOSIS — I5033 Acute on chronic diastolic (congestive) heart failure: Secondary | ICD-10-CM | POA: Diagnosis not present

## 2023-08-20 DIAGNOSIS — M25511 Pain in right shoulder: Secondary | ICD-10-CM | POA: Diagnosis not present

## 2023-08-20 DIAGNOSIS — M25551 Pain in right hip: Secondary | ICD-10-CM | POA: Diagnosis not present

## 2023-08-24 DIAGNOSIS — D631 Anemia in chronic kidney disease: Secondary | ICD-10-CM | POA: Diagnosis not present

## 2023-08-24 DIAGNOSIS — I130 Hypertensive heart and chronic kidney disease with heart failure and stage 1 through stage 4 chronic kidney disease, or unspecified chronic kidney disease: Secondary | ICD-10-CM | POA: Diagnosis not present

## 2023-08-24 DIAGNOSIS — I5033 Acute on chronic diastolic (congestive) heart failure: Secondary | ICD-10-CM | POA: Diagnosis not present

## 2023-08-24 DIAGNOSIS — I480 Paroxysmal atrial fibrillation: Secondary | ICD-10-CM | POA: Diagnosis not present

## 2023-08-24 DIAGNOSIS — J9621 Acute and chronic respiratory failure with hypoxia: Secondary | ICD-10-CM | POA: Diagnosis not present

## 2023-08-24 DIAGNOSIS — N184 Chronic kidney disease, stage 4 (severe): Secondary | ICD-10-CM | POA: Diagnosis not present

## 2023-08-26 DIAGNOSIS — N184 Chronic kidney disease, stage 4 (severe): Secondary | ICD-10-CM | POA: Diagnosis not present

## 2023-08-26 DIAGNOSIS — I509 Heart failure, unspecified: Secondary | ICD-10-CM | POA: Diagnosis not present

## 2023-08-26 DIAGNOSIS — S20229A Contusion of unspecified back wall of thorax, initial encounter: Secondary | ICD-10-CM | POA: Diagnosis not present

## 2023-08-26 DIAGNOSIS — M79604 Pain in right leg: Secondary | ICD-10-CM | POA: Diagnosis not present

## 2023-08-26 DIAGNOSIS — W19XXXA Unspecified fall, initial encounter: Secondary | ICD-10-CM | POA: Diagnosis not present

## 2023-08-27 DIAGNOSIS — I480 Paroxysmal atrial fibrillation: Secondary | ICD-10-CM | POA: Diagnosis not present

## 2023-08-27 DIAGNOSIS — D631 Anemia in chronic kidney disease: Secondary | ICD-10-CM | POA: Diagnosis not present

## 2023-08-27 DIAGNOSIS — N184 Chronic kidney disease, stage 4 (severe): Secondary | ICD-10-CM | POA: Diagnosis not present

## 2023-08-27 DIAGNOSIS — M25511 Pain in right shoulder: Secondary | ICD-10-CM | POA: Diagnosis not present

## 2023-08-27 DIAGNOSIS — I5033 Acute on chronic diastolic (congestive) heart failure: Secondary | ICD-10-CM | POA: Diagnosis not present

## 2023-08-27 DIAGNOSIS — M25551 Pain in right hip: Secondary | ICD-10-CM | POA: Diagnosis not present

## 2023-08-27 DIAGNOSIS — I130 Hypertensive heart and chronic kidney disease with heart failure and stage 1 through stage 4 chronic kidney disease, or unspecified chronic kidney disease: Secondary | ICD-10-CM | POA: Diagnosis not present

## 2023-08-27 DIAGNOSIS — J9621 Acute and chronic respiratory failure with hypoxia: Secondary | ICD-10-CM | POA: Diagnosis not present

## 2023-08-31 DIAGNOSIS — I130 Hypertensive heart and chronic kidney disease with heart failure and stage 1 through stage 4 chronic kidney disease, or unspecified chronic kidney disease: Secondary | ICD-10-CM | POA: Diagnosis not present

## 2023-08-31 DIAGNOSIS — I480 Paroxysmal atrial fibrillation: Secondary | ICD-10-CM | POA: Diagnosis not present

## 2023-08-31 DIAGNOSIS — D631 Anemia in chronic kidney disease: Secondary | ICD-10-CM | POA: Diagnosis not present

## 2023-08-31 DIAGNOSIS — I5033 Acute on chronic diastolic (congestive) heart failure: Secondary | ICD-10-CM | POA: Diagnosis not present

## 2023-08-31 DIAGNOSIS — N184 Chronic kidney disease, stage 4 (severe): Secondary | ICD-10-CM | POA: Diagnosis not present

## 2023-08-31 DIAGNOSIS — J9621 Acute and chronic respiratory failure with hypoxia: Secondary | ICD-10-CM | POA: Diagnosis not present

## 2023-09-04 DIAGNOSIS — I480 Paroxysmal atrial fibrillation: Secondary | ICD-10-CM | POA: Diagnosis not present

## 2023-09-04 DIAGNOSIS — I5033 Acute on chronic diastolic (congestive) heart failure: Secondary | ICD-10-CM | POA: Diagnosis not present

## 2023-09-04 DIAGNOSIS — I130 Hypertensive heart and chronic kidney disease with heart failure and stage 1 through stage 4 chronic kidney disease, or unspecified chronic kidney disease: Secondary | ICD-10-CM | POA: Diagnosis not present

## 2023-09-04 DIAGNOSIS — J9621 Acute and chronic respiratory failure with hypoxia: Secondary | ICD-10-CM | POA: Diagnosis not present

## 2023-09-04 DIAGNOSIS — N184 Chronic kidney disease, stage 4 (severe): Secondary | ICD-10-CM | POA: Diagnosis not present

## 2023-09-04 DIAGNOSIS — D631 Anemia in chronic kidney disease: Secondary | ICD-10-CM | POA: Diagnosis not present

## 2023-09-05 DIAGNOSIS — I480 Paroxysmal atrial fibrillation: Secondary | ICD-10-CM | POA: Diagnosis not present

## 2023-09-05 DIAGNOSIS — J9811 Atelectasis: Secondary | ICD-10-CM | POA: Diagnosis not present

## 2023-09-05 DIAGNOSIS — I272 Pulmonary hypertension, unspecified: Secondary | ICD-10-CM | POA: Diagnosis not present

## 2023-09-05 DIAGNOSIS — N184 Chronic kidney disease, stage 4 (severe): Secondary | ICD-10-CM | POA: Diagnosis not present

## 2023-09-05 DIAGNOSIS — Z950 Presence of cardiac pacemaker: Secondary | ICD-10-CM | POA: Diagnosis not present

## 2023-09-05 DIAGNOSIS — Z9181 History of falling: Secondary | ICD-10-CM | POA: Diagnosis not present

## 2023-09-05 DIAGNOSIS — I5033 Acute on chronic diastolic (congestive) heart failure: Secondary | ICD-10-CM | POA: Diagnosis not present

## 2023-09-05 DIAGNOSIS — Z853 Personal history of malignant neoplasm of breast: Secondary | ICD-10-CM | POA: Diagnosis not present

## 2023-09-05 DIAGNOSIS — I130 Hypertensive heart and chronic kidney disease with heart failure and stage 1 through stage 4 chronic kidney disease, or unspecified chronic kidney disease: Secondary | ICD-10-CM | POA: Diagnosis not present

## 2023-09-05 DIAGNOSIS — Z85828 Personal history of other malignant neoplasm of skin: Secondary | ICD-10-CM | POA: Diagnosis not present

## 2023-09-05 DIAGNOSIS — Z7901 Long term (current) use of anticoagulants: Secondary | ICD-10-CM | POA: Diagnosis not present

## 2023-09-05 DIAGNOSIS — N179 Acute kidney failure, unspecified: Secondary | ICD-10-CM | POA: Diagnosis not present

## 2023-09-05 DIAGNOSIS — J9621 Acute and chronic respiratory failure with hypoxia: Secondary | ICD-10-CM | POA: Diagnosis not present

## 2023-09-05 DIAGNOSIS — Z9981 Dependence on supplemental oxygen: Secondary | ICD-10-CM | POA: Diagnosis not present

## 2023-09-05 DIAGNOSIS — D631 Anemia in chronic kidney disease: Secondary | ICD-10-CM | POA: Diagnosis not present

## 2023-09-05 DIAGNOSIS — D509 Iron deficiency anemia, unspecified: Secondary | ICD-10-CM | POA: Diagnosis not present

## 2023-09-08 DIAGNOSIS — J9621 Acute and chronic respiratory failure with hypoxia: Secondary | ICD-10-CM | POA: Diagnosis not present

## 2023-09-08 DIAGNOSIS — N184 Chronic kidney disease, stage 4 (severe): Secondary | ICD-10-CM | POA: Diagnosis not present

## 2023-09-08 DIAGNOSIS — I130 Hypertensive heart and chronic kidney disease with heart failure and stage 1 through stage 4 chronic kidney disease, or unspecified chronic kidney disease: Secondary | ICD-10-CM | POA: Diagnosis not present

## 2023-09-08 DIAGNOSIS — I5033 Acute on chronic diastolic (congestive) heart failure: Secondary | ICD-10-CM | POA: Diagnosis not present

## 2023-09-08 DIAGNOSIS — D631 Anemia in chronic kidney disease: Secondary | ICD-10-CM | POA: Diagnosis not present

## 2023-09-08 DIAGNOSIS — I480 Paroxysmal atrial fibrillation: Secondary | ICD-10-CM | POA: Diagnosis not present

## 2023-09-12 ENCOUNTER — Other Ambulatory Visit (HOSPITAL_BASED_OUTPATIENT_CLINIC_OR_DEPARTMENT_OTHER): Payer: Self-pay | Admitting: Cardiovascular Disease

## 2023-09-12 DIAGNOSIS — D631 Anemia in chronic kidney disease: Secondary | ICD-10-CM | POA: Diagnosis not present

## 2023-09-12 DIAGNOSIS — I480 Paroxysmal atrial fibrillation: Secondary | ICD-10-CM | POA: Diagnosis not present

## 2023-09-12 DIAGNOSIS — I5033 Acute on chronic diastolic (congestive) heart failure: Secondary | ICD-10-CM | POA: Diagnosis not present

## 2023-09-12 DIAGNOSIS — J9621 Acute and chronic respiratory failure with hypoxia: Secondary | ICD-10-CM | POA: Diagnosis not present

## 2023-09-12 DIAGNOSIS — N184 Chronic kidney disease, stage 4 (severe): Secondary | ICD-10-CM | POA: Diagnosis not present

## 2023-09-12 DIAGNOSIS — I130 Hypertensive heart and chronic kidney disease with heart failure and stage 1 through stage 4 chronic kidney disease, or unspecified chronic kidney disease: Secondary | ICD-10-CM | POA: Diagnosis not present

## 2023-09-14 NOTE — Telephone Encounter (Signed)
 Prescription refill request for Eliquis  received. Indication:afib Last office visit:7/25 Scr:1.60  8/25 Age 89  Weight 61.9  kg  Prescription refilled

## 2023-09-16 ENCOUNTER — Telehealth: Payer: Self-pay

## 2023-09-16 NOTE — Telephone Encounter (Signed)
 Called and spoke with patient's caregiver.  She is currently asleep and will have her call the office when she gets up.   Patient needs to be scheduled (9/12 if possible) for [facility] software upgrade to her device with device clinic.

## 2023-09-16 NOTE — Telephone Encounter (Signed)
 Call back received from Pt.  Pt scheduled for software upgrade 09/18/2023 at 3:00 pm.  Directions given to new building.

## 2023-09-16 NOTE — Telephone Encounter (Signed)
 [facility] advisory.

## 2023-09-18 ENCOUNTER — Ambulatory Visit: Attending: Cardiology

## 2023-09-18 DIAGNOSIS — I495 Sick sinus syndrome: Secondary | ICD-10-CM

## 2023-09-18 LAB — CUP PACEART INCLINIC DEVICE CHECK
Date Time Interrogation Session: 20250912151142
Implantable Lead Connection Status: 753985
Implantable Lead Connection Status: 753985
Implantable Lead Implant Date: 20210716
Implantable Lead Implant Date: 20210716
Implantable Lead Location: 753859
Implantable Lead Location: 753860
Implantable Lead Model: 7840
Implantable Lead Model: 7841
Implantable Lead Serial Number: 1017229
Implantable Lead Serial Number: 1090224
Implantable Pulse Generator Implant Date: 20210716
Pulse Gen Serial Number: 547553

## 2023-09-18 NOTE — Patient Instructions (Signed)
Follow up per recall. 

## 2023-09-18 NOTE — Progress Notes (Signed)
 BS software upload completed.

## 2023-09-22 DIAGNOSIS — J9621 Acute and chronic respiratory failure with hypoxia: Secondary | ICD-10-CM | POA: Diagnosis not present

## 2023-09-22 DIAGNOSIS — I480 Paroxysmal atrial fibrillation: Secondary | ICD-10-CM | POA: Diagnosis not present

## 2023-09-22 DIAGNOSIS — N184 Chronic kidney disease, stage 4 (severe): Secondary | ICD-10-CM | POA: Diagnosis not present

## 2023-09-22 DIAGNOSIS — I130 Hypertensive heart and chronic kidney disease with heart failure and stage 1 through stage 4 chronic kidney disease, or unspecified chronic kidney disease: Secondary | ICD-10-CM | POA: Diagnosis not present

## 2023-09-22 DIAGNOSIS — I5033 Acute on chronic diastolic (congestive) heart failure: Secondary | ICD-10-CM | POA: Diagnosis not present

## 2023-09-22 DIAGNOSIS — D631 Anemia in chronic kidney disease: Secondary | ICD-10-CM | POA: Diagnosis not present

## 2023-09-25 DIAGNOSIS — I5033 Acute on chronic diastolic (congestive) heart failure: Secondary | ICD-10-CM | POA: Diagnosis not present

## 2023-09-25 DIAGNOSIS — J9621 Acute and chronic respiratory failure with hypoxia: Secondary | ICD-10-CM | POA: Diagnosis not present

## 2023-09-25 DIAGNOSIS — D631 Anemia in chronic kidney disease: Secondary | ICD-10-CM | POA: Diagnosis not present

## 2023-09-25 DIAGNOSIS — I480 Paroxysmal atrial fibrillation: Secondary | ICD-10-CM | POA: Diagnosis not present

## 2023-09-25 DIAGNOSIS — N184 Chronic kidney disease, stage 4 (severe): Secondary | ICD-10-CM | POA: Diagnosis not present

## 2023-09-25 DIAGNOSIS — I130 Hypertensive heart and chronic kidney disease with heart failure and stage 1 through stage 4 chronic kidney disease, or unspecified chronic kidney disease: Secondary | ICD-10-CM | POA: Diagnosis not present

## 2023-10-15 NOTE — Progress Notes (Signed)
 Remote PPM Transmission

## 2023-10-16 ENCOUNTER — Ambulatory Visit: Payer: Medicare Other | Attending: Internal Medicine

## 2023-10-16 DIAGNOSIS — I480 Paroxysmal atrial fibrillation: Secondary | ICD-10-CM | POA: Diagnosis not present

## 2023-10-19 ENCOUNTER — Encounter (HOSPITAL_BASED_OUTPATIENT_CLINIC_OR_DEPARTMENT_OTHER): Payer: Self-pay | Admitting: Nurse Practitioner

## 2023-10-19 ENCOUNTER — Ambulatory Visit (INDEPENDENT_AMBULATORY_CARE_PROVIDER_SITE_OTHER): Admitting: Nurse Practitioner

## 2023-10-19 VITALS — BP 138/62 | HR 60 | Ht 65.5 in | Wt 107.9 lb

## 2023-10-19 DIAGNOSIS — N1832 Chronic kidney disease, stage 3b: Secondary | ICD-10-CM

## 2023-10-19 DIAGNOSIS — Z5181 Encounter for therapeutic drug level monitoring: Secondary | ICD-10-CM

## 2023-10-19 DIAGNOSIS — I10 Essential (primary) hypertension: Secondary | ICD-10-CM

## 2023-10-19 DIAGNOSIS — Z950 Presence of cardiac pacemaker: Secondary | ICD-10-CM | POA: Diagnosis not present

## 2023-10-19 DIAGNOSIS — Z7901 Long term (current) use of anticoagulants: Secondary | ICD-10-CM | POA: Diagnosis not present

## 2023-10-19 DIAGNOSIS — I480 Paroxysmal atrial fibrillation: Secondary | ICD-10-CM | POA: Diagnosis not present

## 2023-10-19 DIAGNOSIS — I5032 Chronic diastolic (congestive) heart failure: Secondary | ICD-10-CM

## 2023-10-19 MED ORDER — POTASSIUM CHLORIDE CRYS ER 20 MEQ PO TBCR
20.0000 meq | EXTENDED_RELEASE_TABLET | Freq: Every day | ORAL | 3 refills | Status: AC
Start: 2023-10-19 — End: ?

## 2023-10-19 NOTE — Patient Instructions (Signed)
 Medication Instructions:   CHANGE Potassium one ( 1) tablet by mouth ( 20 mEq) daily.   *If you need a refill on your cardiac medications before your next appointment, please call your pharmacy*  Lab Work:  Your physician recommends that you return for lab work on the day of your appointment with Allison Arthur, PA-C.   If you have labs (blood work) drawn today and your tests are completely normal, you will receive your results only by: MyChart Message (if you have MyChart) OR A paper copy in the mail If you have any lab test that is abnormal or we need to change your treatment, we will call you to review the results.  Testing/Procedures:   None ordered.   Follow-Up: At [facility], you and your health needs are our priority.  As part of our continuing mission to provide you with exceptional heart care, our providers are all part of one team.  This team includes your primary Cardiologist (physician) and Advanced Practice Providers or APPs (Physician Assistants and Nurse Practitioners) who all work together to provide you with the care you need, when you need it.  Your next appointment:   1 month(s)  Provider:   Charlies Arthur, PA-C than  Allison Norman    We recommend signing up for the patient portal called MyChart.  Sign up information is provided on this After Visit Summary.  MyChart is used to connect with patients for Virtual Visits (Telemedicine).  Patients are able to view lab/test results, encounter notes, upcoming appointments, etc.  Non-urgent messages can be sent to your provider as well.   To learn more about what you can do with MyChart, go to [email or web address].

## 2023-10-19 NOTE — Progress Notes (Addendum)
 " Cardiology Office Note   Date:  10/20/2023  ID:  Allison Norman, Allison Norman 15-Sep-1924, MRN [id number] PCP: Okey Carlin Redbird, MD  [facility] Providers Cardiologist:  Annabella Scarce, MD Electrophysiologist:  Danelle Birmingham, MD     [facility] Atrial fibrillation on chronic anticoagulation Hypertension Aortic valve sclerosis CKD Stage IV Pulmonary embolus Tachy-brady syndrome s/p PPM implant On chronic O2  She initially established with hypertension clinic 02/2019.  Reporting history of hypertension for several years.  History of atrial fibrillation followed by heart care since 2017.  She reported BP was better controlled prior to developing A-fib.  She had lower extremity swelling on amlodipine  10 mg daily.  Doxazosin  was discontinued due to shortness of breath and heaviness in her chest.  She also stopped hydralazine  due to intolerance.  Diltiazem  also caused lower extremity edema.  Abnormal pulmonary function test in 2019.  Carotid Dopplers 01/2020 showed 1 to 39% stenosis bilaterally.  Echo 01/2020 revealed LVEF 65 to 70% with severe LVH and grade 3 diastolic dysfunction.  It showed severe biatrial enlargement and moderate to severe tricuspid regurgitation.  Aortic stenosis was mild.  PASP was 65 mmHg.  She was prescribed daily Lasix .  Hospitalization 03/2021 with pulmonary embolism.  Hematology recommended increasing Eliquis  to 5 mg twice daily.  She was also treated for pneumonia. Echo 03/2022 EF 65-70%, mild LVH, grade 2 diastolic dysfunction.  Elevated blood pressure 10/2022.  She was given hydralazine  but did not tolerate it.  Digoxin  had been discontinued due to elevated levels.  SBP running in the 170s.  Clonidine  was increased to 0.3 mg and then switched to patch, which unfortunately caused skin irritation.  Her daughter noted that BP previously better managed on minoxidil  so this was resumed and clonidine  was discontinued.  Valsartan  was added and she subsequently developed low blood  pressures.  Previous antihypertensives: Amlodipine  - edema at 10 mg Diltiazem  - leg swelling Doxazosin  Hydralazine  Clonidine  patch - skin irritation  Last cardiology clinic visit was 03/04/2023 with Dr. Scarce.  She was managing BP with carvedilol  twice daily and minoxidil  around lunchtime.  Her blood pressure tends to rise around 530 or 6 PM and she uses valsartan  as a rescue medication if BP exceeds 160/80 mmHg.  She had taken valsartan  4 or 5 times recently with stabilization of BP between 120 to 150 mmHg, averaging in the 130s.  She reported feeling less energetic since stopping clonidine  which she attributed to aging.  She has the most energy at night.  No significant leg swelling.  She was also taking Lasix  20 mg daily with an option for an extra 20 mg if swelling occurs.  On amiodarone  for rhythm control.  Follow-up in 6 months was recommended.  Home health nurse called in April 2025 to report SBP's in 110s. Plan to change minoxidil  to prn for SBP > 150 mmHg.   Called the office 06/19/23 to note increased leg edema. BNP 954.9 on 06/22/23, recommendation per Dr. Mona to increase Lasix  to 80 mg daily and soon follow-up.   Seen in clinic by me on 07/06/23 for leg edema and accompanied by a caregiver. She has 24 hour care. She is on chronic O2 at 2L which she states has been present during hospital admission in 2023.  She reports increased leg edema for approximately 2 weeks despite consistently elevating her legs.  She admits she was not wearing compression stockings until today.  She denies increased sodium intake and admits she often does not eat very  much.  She has not been monitoring weight consistently, but caregiver reports home weight typically 120-125 lbs with increase last week to 131 lbs. Lasix  was increased from 40 mg to 80 mg daily five days ago without significant diuresis or reduction in swelling. A small leg wound leaks fluid continuously. She does not report dyspnea but caregiver  feels that she has a more difficult getting her breath when talking. She sleeps on one and a half pillows and denies orthopnea or PND but feels exhausted after getting into bed because she feels that her bed is too high. Often feels full before eating. Due to previous kidney issues and external catheter, she does not feel the urge to urinate or feel empty after urination.  She denies chest pain, palpitations, presyncope, syncope.  BP well-controlled at home. We changed her Lasix  to torsemide , start 40 mg daily x 3 days then reduce to 20 mg daily. Follow-up pro-BNP was elevated and kidney function was diminished but stable. She was advised to continue torsemide  40 mg daily because weight remained elevated. She noted slight improvement  in leg edema.   She contacted our office on 07/14/23 with swelling up to knee, weight gain of 3 lbs and compression felt to be causing increased swelling. She was advised to continue leg elevation and take torsemide  40 mg twice daily for 3 days.   Note, remote device check 07/18/23 revealed 100% a fib burden since May.  Seen in follow-up by me on 07/23/23 with her daughter for follow-up of edema. She is having persistent leg swelling up to her thighs, fluid leakage, and swelling of her left hand. She wrapped her leg to avoid leakage but it has continued to seep. She has been wearing compression stockings but there is concern that they are impeding blood flow at times. Despite higher doses of diuretics, the swelling persist and she does not have brisk urine output. She occasionally experiences shortness of breath, though she did not feel short of breath getting from the car to the exam room today.  She denies difficulty laying flat, PND, chest discomfort.  Oxygen  saturation was low initially but improved with rest. She is on chronic O2. Her daughter reports that pt prepares her own medications and does not like when family attempts to help. Torsemide  40 mg daily was continued and it  was later suggested that she may not have started the Torsemide  previously.   Admission 7/23-7/30/25 for acute on chronic CHF.  Beta blocker was changed to lopressor  12.5 mg BID instead of carvedilol  to provide for BP room to diuresis with Lasix . She did not want to pursue palliative care options  History of Present Illness Discussed the use of AI scribe software for clinical note transcription with the patient, who gave verbal consent to proceed.  History of Present Illness Allison Norman is a very pleasant 89 year old female who presents with a caregiver for follow-up of heart failure. She has been managing her oxygen  use independently, reducing it to one liter at night, and wishes to discontinue it completely. She experiences no frequent awakenings at night or dyspnea during daily activities, including walking from the car to the clinic. She feels breathing has improved. She does not weigh consistently but denies edema, orthopnea, PND, chest pain.  Able to ambulate at home without difficulty. She takes 60 mg of furosemide  daily in the morning. She was previously on a higher dose and recalls a past dosing mix-up with torsemide . She was switched from carvedilol   to metoprolol  for blood pressure management. Her current blood pressure is 142/64, which she considers satisfactory. She has not been taking potassium supplements recently. She did not like taking the potassium powder that was mixed with water.    ROS: See HPI  Studies Reviewed       No results found for: LIPOA  Risk Assessment/Calculations  CHA2DS2-VASc Score = 5   This indicates a 7.2% annual risk of stroke. The patient's score is based upon: CHF History: 1 HTN History: 1 Diabetes History: 0 Stroke History: 0 Vascular Disease History: 0 Age Score: 2 Gender Score: 1             Physical Exam VS:  BP 138/62   Pulse 60   Ht 5' 5.5 (1.664 m)   Wt 107 lb 14.4 oz (48.9 kg)   SpO2 96%   BMI 17.68 kg/m    Wt Readings  from Last 3 Encounters:  10/19/23 107 lb 14.4 oz (48.9 kg)  08/05/23 136 lb 8 oz (61.9 kg)  07/23/23 149 lb (67.6 kg)    GEN: Frail, chronically ill appearing in no acute distress NECK: No JVD; No carotid bruits CARDIAC: RRR, no murmurs, rubs, gallops RESPIRATORY:  Clear to auscultation without rales, wheezing or rhonchi  ABDOMEN: Soft, non-tender, non-distended EXTREMITIES:  No edema; mild bruising bilaterally with no significant deformity    Assessment & Plan Bilateral leg edema Chronic HFpEF Dyspnea Leg edema has improved significantly since last office visit. She denies dyspnea, orthopnea, chest pain, or PND.  She reports urine output is not brisk. She does not weigh consistently but reports stable weight. She appears euvolemic on exam and feels well. Advised based on her stability, would recommend we continue home medications as they are. We discussed alternating Lasix  60 mg with 40 mg daily, but she feels this will cause confusion.  -Continue Lasix  60 mg daily. -Continue fluid restriction and low sodium diet  -Monitor weight daily  Persistent Atrial Fibrillation on Chronic Anticoagulation Atrial fib may be contributing to fluid retention, however HR is well controlled. She notes easy bleeding with skin tears, but no significant bleeding.    -Continue Eliquis  2.5 mg twice daily which is appropriate dose for stroke prevention for CHA2DS2-VASc score of 5. -Continue amiodarone  100 mg daily for rhythm control -Continue metoprolol  for HR control  Hypertension BP is mildly elevated in clinic, but she reports it is stable for her. She feels improved energy with BP goal 140/80.  -Continue to monitor BP routinely - Continue metoprolol   Pacemaker implanted Normal device function on remote device check 09/18/23. She is due for EP follow-up. No acute concerns today.  -Schedule EP follow-up in one month  Chronic Kidney Disease   AKI during hospital admission 07/2023. Renal function has  improved. Baseline creatinine felt to be around 1.6. Stable renal function on labs completed 09/24/23. Lengthy discussion about the importance of potassium supplementation.  -Continue Lasix  60 mg daily -Resume potassium chloride  20 mEq daily        Dispo: 1 month with EP/3-4 months with Dr. Raford or APP for general cardiology  Signed, Rosaline Bane, NP-C "

## 2023-10-20 DIAGNOSIS — H02836 Dermatochalasis of left eye, unspecified eyelid: Secondary | ICD-10-CM | POA: Diagnosis not present

## 2023-10-20 DIAGNOSIS — H353113 Nonexudative age-related macular degeneration, right eye, advanced atrophic without subfoveal involvement: Secondary | ICD-10-CM | POA: Diagnosis not present

## 2023-10-20 DIAGNOSIS — H353124 Nonexudative age-related macular degeneration, left eye, advanced atrophic with subfoveal involvement: Secondary | ICD-10-CM | POA: Diagnosis not present

## 2023-10-20 DIAGNOSIS — H353211 Exudative age-related macular degeneration, right eye, with active choroidal neovascularization: Secondary | ICD-10-CM | POA: Diagnosis not present

## 2023-10-20 DIAGNOSIS — H02833 Dermatochalasis of right eye, unspecified eyelid: Secondary | ICD-10-CM | POA: Diagnosis not present

## 2023-10-20 DIAGNOSIS — H35351 Cystoid macular degeneration, right eye: Secondary | ICD-10-CM | POA: Diagnosis not present

## 2023-10-20 LAB — CUP PACEART REMOTE DEVICE CHECK
Battery Remaining Longevity: 54 mo
Battery Remaining Percentage: 92 %
Brady Statistic RA Percent Paced: 22 %
Brady Statistic RV Percent Paced: 14 %
Date Time Interrogation Session: 20251010022200
Implantable Lead Connection Status: 753985
Implantable Lead Connection Status: 753985
Implantable Lead Implant Date: 20210716
Implantable Lead Implant Date: 20210716
Implantable Lead Location: 753859
Implantable Lead Location: 753860
Implantable Lead Model: 7840
Implantable Lead Model: 7841
Implantable Lead Serial Number: 1017229
Implantable Lead Serial Number: 1090224
Implantable Pulse Generator Implant Date: 20210716
Lead Channel Impedance Value: 541 Ohm
Lead Channel Impedance Value: 628 Ohm
Lead Channel Pacing Threshold Amplitude: 0.5 V
Lead Channel Pacing Threshold Amplitude: 1.3 V
Lead Channel Pacing Threshold Pulse Width: 0.4 ms
Lead Channel Pacing Threshold Pulse Width: 0.4 ms
Lead Channel Setting Pacing Amplitude: 2 V
Lead Channel Setting Pacing Amplitude: 2.5 V
Lead Channel Setting Pacing Pulse Width: 0.4 ms
Lead Channel Setting Sensing Sensitivity: 2.5 mV
Pulse Gen Serial Number: 547553
Zone Setting Status: 755011

## 2023-10-20 NOTE — Progress Notes (Signed)
 Remote PPM Transmission

## 2023-10-22 ENCOUNTER — Ambulatory Visit: Payer: Self-pay | Admitting: Internal Medicine

## 2023-11-02 ENCOUNTER — Emergency Department (HOSPITAL_BASED_OUTPATIENT_CLINIC_OR_DEPARTMENT_OTHER)

## 2023-11-02 ENCOUNTER — Encounter (HOSPITAL_BASED_OUTPATIENT_CLINIC_OR_DEPARTMENT_OTHER): Payer: Self-pay

## 2023-11-02 ENCOUNTER — Inpatient Hospital Stay (HOSPITAL_BASED_OUTPATIENT_CLINIC_OR_DEPARTMENT_OTHER)
Admission: EM | Admit: 2023-11-02 | Discharge: 2023-11-09 | DRG: 576 | Disposition: A | Discharge status: Home Health | Attending: Internal Medicine | Admitting: Internal Medicine

## 2023-11-02 DIAGNOSIS — Z66 Do not resuscitate: Secondary | ICD-10-CM | POA: Diagnosis present

## 2023-11-02 DIAGNOSIS — E43 Unspecified severe protein-calorie malnutrition: Secondary | ICD-10-CM | POA: Diagnosis present

## 2023-11-02 DIAGNOSIS — Z9012 Acquired absence of left breast and nipple: Secondary | ICD-10-CM

## 2023-11-02 DIAGNOSIS — Z604 Social exclusion and rejection: Secondary | ICD-10-CM | POA: Diagnosis present

## 2023-11-02 DIAGNOSIS — Z87891 Personal history of nicotine dependence: Secondary | ICD-10-CM

## 2023-11-02 DIAGNOSIS — Z881 Allergy status to other antibiotic agents status: Secondary | ICD-10-CM

## 2023-11-02 DIAGNOSIS — N184 Chronic kidney disease, stage 4 (severe): Secondary | ICD-10-CM | POA: Diagnosis not present

## 2023-11-02 DIAGNOSIS — I350 Nonrheumatic aortic (valve) stenosis: Secondary | ICD-10-CM | POA: Diagnosis present

## 2023-11-02 DIAGNOSIS — L7632 Postprocedural hematoma of skin and subcutaneous tissue following other procedure: Secondary | ICD-10-CM | POA: Diagnosis not present

## 2023-11-02 DIAGNOSIS — Z803 Family history of malignant neoplasm of breast: Secondary | ICD-10-CM

## 2023-11-02 DIAGNOSIS — S8992XA Unspecified injury of left lower leg, initial encounter: Secondary | ICD-10-CM | POA: Diagnosis not present

## 2023-11-02 DIAGNOSIS — I495 Sick sinus syndrome: Secondary | ICD-10-CM | POA: Diagnosis present

## 2023-11-02 DIAGNOSIS — Z823 Family history of stroke: Secondary | ICD-10-CM

## 2023-11-02 DIAGNOSIS — J9611 Chronic respiratory failure with hypoxia: Secondary | ICD-10-CM | POA: Diagnosis present

## 2023-11-02 DIAGNOSIS — Z85828 Personal history of other malignant neoplasm of skin: Secondary | ICD-10-CM

## 2023-11-02 DIAGNOSIS — Z880 Allergy status to penicillin: Secondary | ICD-10-CM

## 2023-11-02 DIAGNOSIS — Y93E1 Activity, personal bathing and showering: Secondary | ICD-10-CM

## 2023-11-02 DIAGNOSIS — Z86711 Personal history of pulmonary embolism: Secondary | ICD-10-CM

## 2023-11-02 DIAGNOSIS — W182XXA Fall in (into) shower or empty bathtub, initial encounter: Secondary | ICD-10-CM | POA: Diagnosis present

## 2023-11-02 DIAGNOSIS — Z23 Encounter for immunization: Secondary | ICD-10-CM | POA: Diagnosis not present

## 2023-11-02 DIAGNOSIS — Z8419 Family history of other disorders of kidney and ureter: Secondary | ICD-10-CM

## 2023-11-02 DIAGNOSIS — I272 Pulmonary hypertension, unspecified: Secondary | ICD-10-CM | POA: Diagnosis present

## 2023-11-02 DIAGNOSIS — D62 Acute posthemorrhagic anemia: Secondary | ICD-10-CM | POA: Diagnosis present

## 2023-11-02 DIAGNOSIS — L89151 Pressure ulcer of sacral region, stage 1: Secondary | ICD-10-CM | POA: Diagnosis present

## 2023-11-02 DIAGNOSIS — Z8701 Personal history of pneumonia (recurrent): Secondary | ICD-10-CM

## 2023-11-02 DIAGNOSIS — Z853 Personal history of malignant neoplasm of breast: Secondary | ICD-10-CM

## 2023-11-02 DIAGNOSIS — I4819 Other persistent atrial fibrillation: Secondary | ICD-10-CM | POA: Diagnosis present

## 2023-11-02 DIAGNOSIS — Z950 Presence of cardiac pacemaker: Secondary | ICD-10-CM

## 2023-11-02 DIAGNOSIS — I110 Hypertensive heart disease with heart failure: Secondary | ICD-10-CM | POA: Diagnosis not present

## 2023-11-02 DIAGNOSIS — M353 Polymyalgia rheumatica: Secondary | ICD-10-CM | POA: Diagnosis present

## 2023-11-02 DIAGNOSIS — I130 Hypertensive heart and chronic kidney disease with heart failure and stage 1 through stage 4 chronic kidney disease, or unspecified chronic kidney disease: Secondary | ICD-10-CM | POA: Diagnosis present

## 2023-11-02 DIAGNOSIS — I1A0 Resistant hypertension: Secondary | ICD-10-CM | POA: Diagnosis present

## 2023-11-02 DIAGNOSIS — Z681 Body mass index (BMI) 19 or less, adult: Secondary | ICD-10-CM | POA: Diagnosis not present

## 2023-11-02 DIAGNOSIS — I5032 Chronic diastolic (congestive) heart failure: Secondary | ICD-10-CM | POA: Diagnosis present

## 2023-11-02 DIAGNOSIS — Z885 Allergy status to narcotic agent status: Secondary | ICD-10-CM

## 2023-11-02 DIAGNOSIS — S8012XA Contusion of left lower leg, initial encounter: Principal | ICD-10-CM | POA: Diagnosis present

## 2023-11-02 DIAGNOSIS — H3530 Unspecified macular degeneration: Secondary | ICD-10-CM | POA: Diagnosis present

## 2023-11-02 DIAGNOSIS — M316 Other giant cell arteritis: Secondary | ICD-10-CM | POA: Diagnosis present

## 2023-11-02 DIAGNOSIS — M810 Age-related osteoporosis without current pathological fracture: Secondary | ICD-10-CM | POA: Diagnosis present

## 2023-11-02 DIAGNOSIS — Z8614 Personal history of Methicillin resistant Staphylococcus aureus infection: Secondary | ICD-10-CM

## 2023-11-02 DIAGNOSIS — Z8249 Family history of ischemic heart disease and other diseases of the circulatory system: Secondary | ICD-10-CM

## 2023-11-02 DIAGNOSIS — N179 Acute kidney failure, unspecified: Secondary | ICD-10-CM | POA: Diagnosis present

## 2023-11-02 DIAGNOSIS — Z79899 Other long term (current) drug therapy: Secondary | ICD-10-CM

## 2023-11-02 DIAGNOSIS — Z9889 Other specified postprocedural states: Secondary | ICD-10-CM

## 2023-11-02 DIAGNOSIS — Z7901 Long term (current) use of anticoagulants: Secondary | ICD-10-CM

## 2023-11-02 DIAGNOSIS — I509 Heart failure, unspecified: Secondary | ICD-10-CM | POA: Diagnosis not present

## 2023-11-02 DIAGNOSIS — Z888 Allergy status to other drugs, medicaments and biological substances status: Secondary | ICD-10-CM

## 2023-11-02 LAB — PROTIME-INR
INR: 1.2 (ref 0.8–1.2)
Prothrombin Time: 16.3 s — ABNORMAL HIGH (ref 11.4–15.2)

## 2023-11-02 LAB — CBC WITH DIFFERENTIAL/PLATELET
Abs Immature Granulocytes: 0.02 K/uL (ref 0.00–0.07)
Basophils Absolute: 0.1 K/uL (ref 0.0–0.1)
Basophils Relative: 1 %
Eosinophils Absolute: 0.1 K/uL (ref 0.0–0.5)
Eosinophils Relative: 2 %
HCT: 41.6 % (ref 36.0–46.0)
Hemoglobin: 13.1 g/dL (ref 12.0–15.0)
Immature Granulocytes: 0 %
Lymphocytes Relative: 30 %
Lymphs Abs: 2 K/uL (ref 0.7–4.0)
MCH: 29.4 pg (ref 26.0–34.0)
MCHC: 31.5 g/dL (ref 30.0–36.0)
MCV: 93.5 fL (ref 80.0–100.0)
Monocytes Absolute: 0.7 K/uL (ref 0.1–1.0)
Monocytes Relative: 11 %
Neutro Abs: 3.7 K/uL (ref 1.7–7.7)
Neutrophils Relative %: 56 %
Platelets: 236 K/uL (ref 150–400)
RBC: 4.45 MIL/uL (ref 3.87–5.11)
RDW: 20.1 % — ABNORMAL HIGH (ref 11.5–15.5)
WBC: 6.6 K/uL (ref 4.0–10.5)
nRBC: 0 % (ref 0.0–0.2)

## 2023-11-02 LAB — BASIC METABOLIC PANEL WITH GFR
Anion gap: 12 (ref 5–15)
BUN: 44 mg/dL — ABNORMAL HIGH (ref 8–23)
CO2: 27 mmol/L (ref 22–32)
Calcium: 9.4 mg/dL (ref 8.9–10.3)
Chloride: 99 mmol/L (ref 98–111)
Creatinine, Ser: 1.9 mg/dL — ABNORMAL HIGH (ref 0.44–1.00)
GFR, Estimated: 24 mL/min — ABNORMAL LOW (ref >60)
Glucose, Bld: 97 mg/dL (ref 70–99)
Potassium: 4.6 mmol/L (ref 3.5–5.1)
Sodium: 138 mmol/L (ref 135–145)

## 2023-11-02 MED ORDER — AMIODARONE HCL 200 MG PO TABS
100.0000 mg | ORAL_TABLET | Freq: Every day | ORAL | Status: DC
  Administered 2023-11-03 – 2023-11-09 (×7): 100 mg via ORAL
  Filled 2023-11-02 (×7): qty 1

## 2023-11-02 MED ORDER — FENTANYL CITRATE (PF) 50 MCG/ML IJ SOSY
50.0000 ug | PREFILLED_SYRINGE | INTRAMUSCULAR | Status: AC | PRN
Start: 2023-11-02 — End: 2023-11-03
  Administered 2023-11-03 (×3): 50 ug via INTRAVENOUS
  Filled 2023-11-02 (×4): qty 1

## 2023-11-02 MED ORDER — OXYCODONE-ACETAMINOPHEN 5-325 MG PO TABS
1.0000 | ORAL_TABLET | Freq: Once | ORAL | Status: AC
  Administered 2023-11-02: 1 via ORAL
  Filled 2023-11-02: qty 1

## 2023-11-02 MED ORDER — DIAZEPAM 2 MG PO TABS
2.0000 mg | ORAL_TABLET | Freq: Two times a day (BID) | ORAL | Status: DC | PRN
  Administered 2023-11-02 – 2023-11-08 (×6): 2 mg via ORAL
  Filled 2023-11-02 (×5): qty 1

## 2023-11-02 MED ORDER — FENTANYL CITRATE (PF) 50 MCG/ML IJ SOSY
25.0000 ug | PREFILLED_SYRINGE | Freq: Once | INTRAMUSCULAR | Status: AC
  Administered 2023-11-02: 25 ug via INTRAVENOUS
  Filled 2023-11-02: qty 1

## 2023-11-02 MED ORDER — ACETAMINOPHEN 325 MG PO TABS
650.0000 mg | ORAL_TABLET | Freq: Once | ORAL | Status: DC
  Filled 2023-11-02: qty 2

## 2023-11-02 NOTE — ED Notes (Signed)
Spoke with Marcello Moores at [facility] for hospitalist consult

## 2023-11-02 NOTE — ED Notes (Signed)
 Spoke with Debby at [facility] re: ortho surgery consult

## 2023-11-02 NOTE — ED Notes (Signed)
 Patient placed on [facility] at this time. Patient wears 1L [place] at bedtime at home. RN aware. SpO2 98% following oxygen  placement.

## 2023-11-02 NOTE — ED Notes (Signed)
 ED Provider at bedside.

## 2023-11-02 NOTE — ED Triage Notes (Signed)
 Pt BIB [facility] for eval of large hematoma to L shin. Pt advises she was getting out of the shower, showerhead hit L shin. Pt reports pain to same, w palpation  Daughter at bedside on arrival to ED

## 2023-11-02 NOTE — Discharge Instructions (Signed)
 Please hold your Eliquis  for the next two days. Ice the hematoma, elevate it daily, and continue with compressive wraps. A referral has been placed for follow-up with plastic surgery in the event this becomes a larger wound

## 2023-11-02 NOTE — ED Notes (Signed)
 Placed patient's leg on ice pack for comfort.

## 2023-11-02 NOTE — ED Provider Notes (Signed)
 [place] EMERGENCY DEPARTMENT AT [facility] Provider Note   CSN: [id number] Arrival date & time: 11/02/23  1547     Patient presents with: Leg Injury (L)   Allison Norman is a 89 y.o. female.   HPI   89 year old female with medical history significant for atrial fibrillation on Eliquis  presenting to the emergency department with concern for a sizable left leg hematoma.  The patient states that she was in the shower when the showerhead swung and hit her left lower leg.  She has had a rapidly expanding hematoma that is associated with significant pain in the left lower extremity.  She has tried icing it without relief.  She last took her Eliquis  this morning.  She denies any other injuries or complaints.  Prior to Admission medications   Medication Sig Start Date End Date Taking? Authorizing Provider  amiodarone  (PACERONE ) 200 MG tablet Take 1/2 (one-half) tablet by mouth once daily 02/13/23   Waddell Danelle ORN, MD  apixaban  (ELIQUIS ) 2.5 MG TABS tablet Take 1 tablet by mouth twice daily 09/14/23   Raford Riggs, MD  diazepam  (VALIUM ) 2 MG tablet Take 2 mg by mouth 2 (two) times daily as needed for anxiety (sleep). 06/23/16   [provider]  furosemide  (LASIX ) 40 MG tablet Take 1.5 tablets (60 mg total) by mouth 2 (two) times daily. 08/05/23 10/19/23  Laurence Locus, DO  iron  polysaccharides (FERREX 150) 150 MG capsule Take 150 mg by mouth 2 (two) times daily.    [provider]  metoprolol  tartrate (LOPRESSOR ) 25 MG tablet Take 0.5 tablets (12.5 mg total) by mouth 2 (two) times daily. 08/05/23 10/19/23  Laurence Locus, DO  pantoprazole  (PROTONIX ) 40 MG tablet Take 1 tablet (40 mg total) by mouth every evening. 08/05/23 10/19/23  Laurence Locus, DO  potassium chloride  (KLOR-CON ) 20 MEQ packet Take 40 mEq by mouth daily. Patient not taking: Reported on 10/19/2023 08/05/23 09/04/23  Laurence Locus, DO  potassium chloride  SA (KLOR-CON  M) 20 MEQ tablet Take 1 tablet (20 mEq total) by  mouth daily. 10/19/23   Swinyer, Rosaline HERO, NP    Allergies: Codeine, Penicillins, Apresoline  [hydralazine ], Cardura  [doxazosin ], and Vibramycin [doxycycline]    Review of Systems  All other systems reviewed and are negative.   Updated Vital Signs BP (!) 182/80   Pulse (!) 59   Temp 97.8 F (36.6 C)   Resp 16   SpO2 96%   Physical Exam Vitals and nursing note reviewed.  Constitutional:      General: She is not in acute distress.    Appearance: She is well-developed.     Comments: Frail-appearing  HENT:     Head: Normocephalic and atraumatic.  Eyes:     Conjunctiva/sclera: Conjunctivae normal.  Cardiovascular:     Rate and Rhythm: Normal rate and regular rhythm.     Heart sounds: No murmur heard. Pulmonary:     Effort: Pulmonary effort is normal. No respiratory distress.     Breath sounds: Normal breath sounds.  Abdominal:     Palpations: Abdomen is soft.     Tenderness: There is no abdominal tenderness.  Musculoskeletal:        General: Tenderness present.     Cervical back: Neck supple.     Comments: Sizable 8 cm hematoma along the left anterior shin, mild surrounding tenderness.  Intact distal DP pulses, compartments are notably soft, no pain with passive extension of the toes of the left lower extremity.  Skin:    General:  Skin is warm and dry.     Capillary Refill: Capillary refill takes less than 2 seconds.  Neurological:     Mental Status: She is alert.  Psychiatric:        Mood and Affect: Mood normal.      (all labs ordered are listed, but only abnormal results are displayed) Labs Reviewed  CBC WITH DIFFERENTIAL/PLATELET - Abnormal; Notable for the following components:      Result Value   RDW 20.1 (*)    All other components within normal limits  BASIC METABOLIC PANEL WITH GFR - Abnormal; Notable for the following components:   BUN 44 (*)    Creatinine, Ser 1.90 (*)    GFR, Estimated 24 (*)    All other components within normal limits   PROTIME-INR - Abnormal; Notable for the following components:   Prothrombin Time 16.3 (*)    All other components within normal limits    EKG: None  Radiology: DG Tibia/Fibula Left Result Date: 11/02/2023 EXAM: 1 VIEW XRAY OF THE LEFT TIBIA AND FIBULA 11/02/2023 05:30:00 PM COMPARISON: None available. CLINICAL HISTORY: Shin injury. Patient brought in by [facility] for evaluation of large hematoma to left shin. Patient advises she was getting out of the shower, showerhead hit left shin. Patient reports pain to same, with palpation. FINDINGS: BONES AND JOINTS: Mild degenerative changes of the knee. No acute fracture. No focal osseous lesion. No joint dislocation. SOFT TISSUES: An area of soft tissue density measuring up to 10 cm is seen in the anterior lower extremity, likely related to hematoma. IMPRESSION: 1. No acute fracture. 2. Soft tissue density measuring up to 10 cm in the anterior lower extremity, likely related to hematoma. Electronically signed by: Greig Pique MD 11/02/2023 06:03 PM EDT RP Workstation: [id number]     Procedures   Medications Ordered in the ED  acetaminophen  (TYLENOL ) tablet 650 mg (650 mg Oral Not Given 11/02/23 1610)  fentaNYL  (SUBLIMAZE ) injection 25 mcg (has no administration in time range)  oxyCODONE -acetaminophen  (PERCOCET/ROXICET) 5-325 MG per tablet 1 tablet (1 tablet Oral Given 11/02/23 1800)                                    Medical Decision Making Amount and/or Complexity of Data Reviewed Labs: ordered. Radiology: ordered.  Risk OTC drugs. Prescription drug management. Decision regarding hospitalization.    89 year old female with medical history significant for atrial fibrillation on Eliquis  presenting to the emergency department with concern for a sizable left leg hematoma.  The patient states that she was in the shower when the showerhead swung and hit her left lower leg.  She has had a rapidly expanding hematoma that is associated with  significant pain in the left lower extremity.  She has tried icing it without relief.  She last took her Eliquis  this morning.  She denies any other injuries or complaints.  On arrival, the patient was vitally stable.  Presenting with a large hematoma after leg trauma.  Considered fracture, considered compartment syndrome however patient without significant findings on exam for compartment syndrome.  Presenting with a large hematoma of the left leg.  Percocet administered for pain control after initial Tylenol  was offered.  XR Tib/Fib: IMPRESSION:  1. No acute fracture.  2. Soft tissue density measuring up to 10 cm in the anterior lower extremity,  likely related to hematoma.   Spoke with Dr. Sherida of on-call orthopedics who recommended  compression, no immediate lancing or drainage, elevation of the extremity, ice for the next 24 hours, holding Eliquis  for the next 2 days and outpatient follow-up.  On reassessment, the patient was still experiencing difficult pain, we will plan for admission for pain control.  Administered IV fentanyl .  Patient could also benefit from PT inpatient, potential wound care consult as wound has high rupture risk and potential for development into chronic wound.  Hospitalist medicine consulted for admission, Dr. Tobie accepting.  Patient and family updated regarding plan of care and were in agreement.     Final diagnoses:  Hematoma of left lower extremity, initial encounter    ED Discharge Orders          Ordered    Ambulatory referral to Plastic Surgery        11/02/23 1751               Jerrol Agent, MD 11/02/23 [phone]

## 2023-11-02 NOTE — Progress Notes (Signed)
 Plan of Care Note for accepted transfer   Patient: SRITHA CHAUNCEY MRN: [id number]   DOA: 11/02/2023  Facility requesting transfer: [facility] Requesting Provider: Dr. Jerrol Reason for transfer: LLE hematoma Facility course:  Patient is a 89 year old female with history significant for persistent atrial fibrillation on Eliquis  and amiodarone , chronic HFpEF, tachy-brady syndrome s/p PPM, CKD stage IV, HTN, history of PE who presented to the ED with a large hematoma to her left shin after he hit her leg when getting out of the shower.  Patient hypertensive on arrival.  Labs notable for creatinine 1.90 (recent baseline ~1.6-1.9), hemoglobin 13.1.  Left tibia/fibula x-ray negative for acute fracture.  Soft tissue density measuring up to 10 cm in anterior lower extremity seen related to the hematoma.    EDP spoke with orthopedics (Dr. Sherida) who advised against lancing the hematoma since patient is on Eliquis .  Pain has been uncontrolled.  Patient is given Percocet 5-325 mg and IV fentanyl  25 mcg.  ED provider concerned regarding risk of expanding hematoma and potential rupture but otherwise requesting admission primarily for uncontrolled pain management and consideration of wound/plastics consult.  Plan of care: The patient is accepted for admission to Telemetry unit, at [facility].  Author: Jorie Blanch, MD 11/02/2023  Check [email or web address] for on-call coverage.  Nursing staff, Please call [facility] Admits & Consults System-Wide number on Amion as soon as patient's arrival, so appropriate admitting provider can evaluate the pt.

## 2023-11-03 ENCOUNTER — Other Ambulatory Visit: Payer: Self-pay

## 2023-11-03 DIAGNOSIS — I350 Nonrheumatic aortic (valve) stenosis: Secondary | ICD-10-CM | POA: Diagnosis not present

## 2023-11-03 DIAGNOSIS — J9611 Chronic respiratory failure with hypoxia: Secondary | ICD-10-CM | POA: Insufficient documentation

## 2023-11-03 DIAGNOSIS — S8012XA Contusion of left lower leg, initial encounter: Secondary | ICD-10-CM | POA: Diagnosis not present

## 2023-11-03 DIAGNOSIS — I482 Chronic atrial fibrillation, unspecified: Secondary | ICD-10-CM | POA: Diagnosis not present

## 2023-11-03 DIAGNOSIS — N184 Chronic kidney disease, stage 4 (severe): Secondary | ICD-10-CM | POA: Diagnosis not present

## 2023-11-03 LAB — COMPREHENSIVE METABOLIC PANEL WITH GFR
ALT: 15 U/L (ref 0–44)
AST: 21 U/L (ref 15–41)
Albumin: 3.1 g/dL — ABNORMAL LOW (ref 3.5–5.0)
Alkaline Phosphatase: 95 U/L (ref 38–126)
Anion gap: 9 (ref 5–15)
BUN: 40 mg/dL — ABNORMAL HIGH (ref 8–23)
CO2: 26 mmol/L (ref 22–32)
Calcium: 8.5 mg/dL — ABNORMAL LOW (ref 8.9–10.3)
Chloride: 105 mmol/L (ref 98–111)
Creatinine, Ser: 1.71 mg/dL — ABNORMAL HIGH (ref 0.44–1.00)
GFR, Estimated: 27 mL/min — ABNORMAL LOW (ref >60)
Glucose, Bld: 110 mg/dL — ABNORMAL HIGH (ref 70–99)
Potassium: 5 mmol/L (ref 3.5–5.1)
Sodium: 140 mmol/L (ref 135–145)
Total Bilirubin: 1 mg/dL (ref 0.0–1.2)
Total Protein: 6.8 g/dL (ref 6.5–8.1)

## 2023-11-03 LAB — HEMOGLOBIN AND HEMATOCRIT, BLOOD
HCT: 38.8 % (ref 36.0–46.0)
Hemoglobin: 12.2 g/dL (ref 12.0–15.0)

## 2023-11-03 MED ORDER — PANTOPRAZOLE SODIUM 40 MG PO TBEC
40.0000 mg | DELAYED_RELEASE_TABLET | Freq: Every day | ORAL | Status: DC
  Administered 2023-11-03 – 2023-11-08 (×7): 40 mg via ORAL
  Filled 2023-11-03 (×7): qty 1

## 2023-11-03 MED ORDER — POTASSIUM CHLORIDE CRYS ER 20 MEQ PO TBCR
20.0000 meq | EXTENDED_RELEASE_TABLET | Freq: Every day | ORAL | Status: DC
  Administered 2023-11-03 – 2023-11-04 (×2): 20 meq via ORAL
  Filled 2023-11-03 (×2): qty 1

## 2023-11-03 MED ORDER — ONDANSETRON HCL 4 MG/2ML IJ SOLN: 4.0000 mg | Freq: Four times a day (QID) | INTRAMUSCULAR | Status: DC | PRN

## 2023-11-03 MED ORDER — FUROSEMIDE 40 MG PO TABS: 40.0000 mg | ORAL_TABLET | Freq: Two times a day (BID) | ORAL | Status: DC

## 2023-11-03 MED ORDER — FUROSEMIDE 40 MG PO TABS
60.0000 mg | ORAL_TABLET | Freq: Every day | ORAL | Status: DC
  Administered 2023-11-04: 60 mg via ORAL
  Filled 2023-11-03 (×2): qty 1

## 2023-11-03 MED ORDER — METOPROLOL TARTRATE 12.5 MG HALF TABLET
12.5000 mg | ORAL_TABLET | Freq: Two times a day (BID) | ORAL | Status: DC
  Administered 2023-11-03 – 2023-11-09 (×13): 12.5 mg via ORAL
  Filled 2023-11-03 (×14): qty 1

## 2023-11-03 MED ORDER — FUROSEMIDE 40 MG PO TABS: 40.0000 mg | ORAL_TABLET | Freq: Every day | ORAL | Status: DC

## 2023-11-03 MED ORDER — METOPROLOL TARTRATE 25 MG PO TABS: 25.0000 mg | ORAL_TABLET | Freq: Every day | ORAL | Status: DC

## 2023-11-03 MED ORDER — WHITE PETROLATUM EX OINT: TOPICAL_OINTMENT | CUTANEOUS | Status: DC | PRN

## 2023-11-03 MED ORDER — OXYCODONE-ACETAMINOPHEN 5-325 MG PO TABS
1.0000 | ORAL_TABLET | Freq: Four times a day (QID) | ORAL | Status: DC | PRN
  Administered 2023-11-04 – 2023-11-09 (×9): 1 via ORAL
  Filled 2023-11-03 (×10): qty 1

## 2023-11-03 MED ORDER — ONDANSETRON HCL 4 MG PO TABS: 4.0000 mg | ORAL_TABLET | Freq: Four times a day (QID) | ORAL | Status: DC | PRN

## 2023-11-03 MED ORDER — INFLUENZA VAC SPLIT HIGH-DOSE 0.5 ML IM SUSY
0.5000 mL | PREFILLED_SYRINGE | INTRAMUSCULAR | Status: AC
  Administered 2023-11-04: 0.5 mL via INTRAMUSCULAR
  Filled 2023-11-03: qty 0.5

## 2023-11-03 NOTE — Assessment & Plan Note (Signed)
 LLE hematoma s/p traumatic strike with shower wand  LE plain films stable apart from 10cm soft tissue density concerning for hematoma  Hgb stable at 13  Not felt to need ortho eval in notes from EDP Consider formal ortho eval as appropriate  WOC consult  Follow closely

## 2023-11-03 NOTE — ED Notes (Signed)
 [facility] called for transport

## 2023-11-03 NOTE — Consult Note (Addendum)
 WOC Nurse Consult Note: Reason for Consult: LLE Hematoma Wound Recommendation: surgical consult given the condition of the wound, patient's CG stated she was informed that there would be an ortho consult today, in the meantime I will place antimicrobial wound care orders. Patient stated that dressing was removed and blood burst out of wound yesterday. Provider removed gauze dressing before placing consult. Wound type: Trauma to LLE anterior aspect, gelatinous, with tissue slit down center  Pressure Injury POA: NO Measurement: 15 x 5 cm Wound bed: closed hematoma Drainage: bloody Periwound:ecchymotic tissue with some erythema, edema Left foot without any discoloration, warm to touch, no edema  Dressing procedure/placement/frequency:  Cleanse entire left lower leg with normal saline, pat dry with gauze and apply 1 layer of Aquacel #[id number] over hematoma wound, cover lightly with rolled Kerlix gauze, secure in place with tape. Do not use Mepilex foam dressing to secure in place or adhesives. Daily  LLE Hematoma 11/03/23   Buttock 11/03/23   Wound type: POA PI DTPI formation Wound bed: light maroon; dusky discoloration Drainage none Periwound: intact; dry Dressing procedure/placement/frequency: Use of sacrum Mepilex foam dressing, change every 2-3 day, or as needed, lift and assess every shift  Coordinated care of patient via Secure Chat with primary nurse.  Patient commented that she has been in bed for a while and not walking; this may have contributed to the POA PI, stated that the buttock skin does feel uncomfortable at times.  WOC will not follow and will remove patient from census task list. Please reconsult if wound worsens in condition and notify provider.   Sherrilyn Hals MSN RN CWOCN WOC [facility]  [phone] (Available from 7-3 pm Mon-Friday)

## 2023-11-03 NOTE — Assessment & Plan Note (Signed)
 Cr 1.9 with GFR in the 20s  Near baseline  Monitor

## 2023-11-03 NOTE — H&P (Signed)
 History and Physical    Patient: Allison Norman FMW:[id number] DOB: 1924/02/05 DOA: 11/02/2023 DOS: the patient was seen and examined on 11/03/2023 PCP: Okey Carlin Redbird, MD  Patient coming from: Home  Chief Complaint:  Chief Complaint  Patient presents with   Leg Injury    L   HPI: Allison Norman is a 89 y.o. female with medical history significant of Afib s/p DCCV in 2017 with re-occurrence in 2018; now rate controlled, HTN, CKD3b, and breast/skin cancer, chronic resp failure on 2L [place], aortic stenosis presenting w/ fall, LLE wound.  Per report, patient was taking a shower when a shower line fell out of her hand swing with a folded on her left shin.  Has significant pain from this.  Patient denies any true falls associated with this episode.  No weakness or dizziness.  States that she developed intermediate large bruise over the affected area.  Patient noted to have been admitted July 22 through July 30 for issues including acute on chronic heart failure to which she is placed in the hospital home program and otherwise stable course.  Has a around-the-clock caregiver support at home.  No complaints of shortness of breath, chest pain.  No reports of abdominal pain nausea or vomiting.  Still taking Eliquis  daily.  No reports of any other episodes of bleeding at home. The ER afebrile, hemodynamically stable.  Satting 88% on room air.  Transition to 2 L nasal cannula to keep O2 sats.  96%.  White count 6.6, hemoglobin 13.1, platelets 236.  Creatinine 1.9.  Left tib-fib showing no acute fracture and soft tissue density measuring 10, anterior extremity concerning for hematoma. Review of Systems: As mentioned in the history of present illness. All other systems reviewed and are negative. Past Medical History:  Diagnosis Date   Acute blood loss anemia 08/27/2020   Acute hypoxemic respiratory failure (HCC) 03/24/2021   Acute urinary retention 09/07/2020   Aortic stenosis 08/20/2021    Mild-moderate.  Repeat echo in one year.   Atrial fibrillation (HCC) 2017   a. s/p DCCV in 10/2015  b. recurrent in 08/2016 --> rate-control pursued.    Atrial fibrillation with RVR (HCC) 08/31/2015   Cancer (HCC)    Breast   CAP (community acquired pneumonia) 03/24/2021   CKD (chronic kidney disease)    GCA (giant cell arteritis) (HCC)    Hordeolum internum left lower eyelid 06/28/2019   Hypertension    Laceration of right lower extremity 08/28/2020   Macular degeneration    Methicillin resistant Staphylococcus aureus infection 07/02/2009   Annotation: septic right knee  Qualifier: Diagnosis of   By: Lutricia OBIE Aquas      IMO SNOMED Dx Update Oct 2024     Osteoporosis    PMR (polymyalgia rheumatica)    Pulmonary hypertension, unspecified (HCC) 02/14/2021   Resistant hypertension 03/15/2019   Shortness of breath 03/15/2019   Skin cancer    Past Surgical History:  Procedure Laterality Date   CARDIOVERSION N/A 10/18/2015   Procedure: CARDIOVERSION;  Surgeon: Oneil JAYSON Parchment, MD;  Location: [facility] ENDOSCOPY;  Service: Cardiovascular;  Laterality: N/A;   CARDIOVERSION N/A 02/20/2017   Procedure: CARDIOVERSION;  Surgeon: Mona Vinie JAYSON, MD;  Location: [facility] ENDOSCOPY;  Service: Cardiovascular;  Laterality: N/A;   CARDIOVERSION N/A 06/18/2017   Procedure: CARDIOVERSION;  Surgeon: Francyne Headland, MD;  Location: [facility] ENDOSCOPY;  Service: Cardiovascular;  Laterality: N/A;   CARDIOVERSION N/A 12/21/2018   Procedure: CARDIOVERSION;  Surgeon: Alveta Aleene PARAS, MD;  Location:  [facility] ENDOSCOPY;  Service: Cardiovascular;  Laterality: N/A;   IR CATHETER TUBE CHANGE  02/28/2021   KNEE SURGERY  2013   MASTECTOMY  1998   left side   PACEMAKER IMPLANT N/A 07/22/2019   Procedure: PACEMAKER IMPLANT;  Surgeon: Waddell Danelle ORN, MD;  Location: [facility] INVASIVE CV LAB;  Service: Cardiovascular;  Laterality: N/A;   Social History:  reports that she quit smoking about 57 years ago. Her smoking use included cigarettes. She  has never used smokeless tobacco. She reports that she does not drink alcohol and does not use drugs.  Allergies  Allergen Reactions   Codeine Itching   Penicillins Itching and Rash   Apresoline  [Hydralazine ] Other (See Comments)    Felt bad Could hear/feel pulse in head Tinnitus    Cardura  [Doxazosin ] Other (See Comments)    Congestion Wheezing Heavy feeling in chest   Vibramycin [Doxycycline] Other (See Comments)    Chest tightness     Family History  Problem Relation Age of Onset   Stroke Mother    Kidney failure Father        died at 46   Heart disease Sister        died at 16   Heart disease Sister        died at 36   Breast cancer Daughter     Prior to Admission medications   Medication Sig Start Date End Date Taking? Authorizing Provider  amiodarone  (PACERONE ) 200 MG tablet Take 1/2 (one-half) tablet by mouth once daily 02/13/23  Yes Waddell Danelle ORN, MD  apixaban  (ELIQUIS ) 2.5 MG TABS tablet Take 1 tablet by mouth twice daily 09/14/23  Yes Raford Riggs, MD  diazepam  (VALIUM ) 2 MG tablet Take 2 mg by mouth See admin instructions. Take 1 tablet (2mg ) by mouth every evening. May also take 1 tablet during the day if needed for anxiety. 06/23/16  Yes [provider]  Famotidine  (PEPCID  AC PO) Take 1 tablet by mouth every evening.   Yes [provider]  furosemide  (LASIX ) 40 MG tablet Take 1.5 tablets (60 mg total) by mouth 2 (two) times daily. Patient taking differently: Take 20-60 mg by mouth See admin instructions. Take 1.5 tablets (60mg ) by mouth every morning and take 1/2 tablet (20mg ) every eveing. 08/05/23 12/12/23 Yes Laurence Locus, DO  iron  polysaccharides (FERREX 150) 150 MG capsule Take 150 mg by mouth 2 (two) times daily.   Yes [provider]  metoprolol  tartrate (LOPRESSOR ) 25 MG tablet Take 0.5 tablets (12.5 mg total) by mouth 2 (two) times daily. 08/05/23 12/12/23 Yes Laurence Locus, DO  potassium chloride  SA (KLOR-CON  M) 20 MEQ tablet Take 1  tablet (20 mEq total) by mouth daily. 10/19/23  Yes Swinyer, Rosaline HERO, NP    Physical Exam: Vitals:   11/03/23 1100 11/03/23 1105 11/03/23 1252 11/03/23 1300  BP: (!) 144/66  (!) 183/89   Pulse: (!) 59 (!) 59 63   Resp: 14  16   Temp:   98.3 F (36.8 C)   TempSrc:   Oral   SpO2: 100% 100% 99%   Weight:    47.6 kg  Height:   5' 5.5 (1.664 m)    Physical Exam Constitutional:      Appearance: She is normal weight.  HENT:     Head: Normocephalic and atraumatic.     Nose: Nose normal.     Mouth/Throat:     Mouth: Mucous membranes are moist.  Eyes:     Pupils: Pupils are  equal, round, and reactive to light.  Cardiovascular:     Rate and Rhythm: Normal rate and regular rhythm.  Pulmonary:     Effort: Pulmonary effort is normal.  Abdominal:     General: Bowel sounds are normal.  Musculoskeletal:        General: Normal range of motion.  Skin:    Comments: See picture   Neurological:     General: No focal deficit present.  Psychiatric:        Mood and Affect: Mood normal.        Data Reviewed:  There are no new results to review at this time.  DG Tibia/Fibula Left EXAM: 1 VIEW XRAY OF THE LEFT TIBIA AND FIBULA 11/02/2023 05:30:00 PM  COMPARISON: None available.  CLINICAL HISTORY: Shin injury. Patient brought in by [facility] for evaluation of large hematoma to left shin. Patient advises she was getting out of the shower, showerhead hit left shin. Patient reports pain to same, with palpation.  FINDINGS:  BONES AND JOINTS: Mild degenerative changes of the knee. No acute fracture. No focal osseous lesion. No joint dislocation.  SOFT TISSUES: An area of soft tissue density measuring up to 10 cm is seen in the anterior lower extremity, likely related to hematoma.  IMPRESSION: 1. No acute fracture. 2. Soft tissue density measuring up to 10 cm in the anterior lower extremity, likely related to hematoma.  Electronically signed by: Greig Pique MD 11/02/2023  06:03 PM EDT RP Workstation: [id number]  Lab Results  Component Value Date   WBC 6.6 11/02/2023   HGB 13.1 11/02/2023   HCT 41.6 11/02/2023   MCV 93.5 11/02/2023   PLT 236 11/02/2023   Last metabolic panel Lab Results  Component Value Date   GLUCOSE 97 11/02/2023   NA 138 11/02/2023   K 4.6 11/02/2023   CL 99 11/02/2023   CO2 27 11/02/2023   BUN 44 (H) 11/02/2023   CREATININE 1.90 (H) 11/02/2023   GFRNONAA 24 (L) 11/02/2023   CALCIUM  9.4 11/02/2023   PHOS 3.7 03/25/2021   PROT 6.3 (L) 07/28/2023   ALBUMIN  2.7 (L) 07/28/2023   LABGLOB 3.0 08/12/2021   AGRATIO 1.3 08/12/2021   BILITOT 1.1 07/28/2023   ALKPHOS 99 07/28/2023   AST 23 07/28/2023   ALT 15 07/28/2023   ANIONGAP 12 11/02/2023    Assessment and Plan: * Posttraumatic pretibial hematoma of left lower extremity, initial encounter LLE hematoma s/p traumatic strike with shower wand  LE plain films stable apart from 10cm soft tissue density concerning for hematoma  Hgb stable at 13  Not felt to need ortho eval in notes from EDP Consider formal ortho eval as appropriate  WOC consult  Follow closely  CKD (chronic kidney disease), stage IV (HCC) - baseline scr 1.7-2.0 Cr 1.9 with GFR in the 20s  Near baseline  Monitor   PAF (paroxysmal atrial fibrillation) (HCC) Baseline atrial fibrillation on eliquis  and amiodarone   Holding eliquis  in setting of large LLE traumatic hematoma    Chronic respiratory failure with hypoxia (HCC) On 2L [place] chronically  At baseline  Monitor    Chronic diastolic CHF (congestive heart failure) (HCC) 2D ECHO 03/2022 w/ EF 65-70% and grade 2 diastolic dysfunction  Appears euvolemic  Monitor volume status with treatment  Monitor        Advance Care Planning:   Code Status: Limited: Do not attempt resuscitation (DNR) -DNR-LIMITED -Do Not Intubate/DNI    Consults: WOC- consider ortho as appropriate   Family Communication: Family  at the bedside   Severity of Illness: The  appropriate patient status for this patient is OBSERVATION. Observation status is judged to be reasonable and necessary in order to provide the required intensity of service to ensure the patient's safety. The patient's presenting symptoms, physical exam findings, and initial radiographic and laboratory data in the context of their medical condition is felt to place them at decreased risk for further clinical deterioration. Furthermore, it is anticipated that the patient will be medically stable for discharge from the hospital within 2 midnights of admission.   Author: Elspeth JINNY Masters, MD 11/03/2023 3:32 PM  For on call review [email or web address].

## 2023-11-03 NOTE — ED Notes (Signed)
 Patient's leg hematoma bleeding through dressing. Dr. Cottie at bedside who requests combat gauze be applied to wound. Combat gauze applied at this time.

## 2023-11-03 NOTE — Assessment & Plan Note (Signed)
 2D ECHO 03/2022 w/ EF 65-70% and grade 2 diastolic dysfunction  Appears euvolemic  Monitor volume status with treatment  Monitor

## 2023-11-03 NOTE — Assessment & Plan Note (Signed)
 On 2L [place] chronically  At baseline  Monitor

## 2023-11-03 NOTE — ED Notes (Signed)
Pt cleaned, new brief applied.

## 2023-11-03 NOTE — Assessment & Plan Note (Signed)
 Baseline atrial fibrillation on eliquis  and amiodarone   Holding eliquis  in setting of large LLE traumatic hematoma

## 2023-11-03 NOTE — ED Notes (Signed)
 Offered breakfast options to patient. Patient requests black coffee and orange juice but declines food options at this time. Beverages given to patient per request. Patient states will hit call bell if she changes her mind about food options.

## 2023-11-03 NOTE — Plan of Care (Signed)

## 2023-11-03 NOTE — ED Notes (Signed)
[facility] at bedside to transport patient to [facility] 

## 2023-11-03 NOTE — ED Notes (Signed)
 Report given to Lonni SAUNDERS, RN at [facility].

## 2023-11-04 DIAGNOSIS — L89151 Pressure ulcer of sacral region, stage 1: Secondary | ICD-10-CM | POA: Diagnosis present

## 2023-11-04 DIAGNOSIS — I4819 Other persistent atrial fibrillation: Secondary | ICD-10-CM | POA: Diagnosis present

## 2023-11-04 DIAGNOSIS — I1A0 Resistant hypertension: Secondary | ICD-10-CM | POA: Diagnosis present

## 2023-11-04 DIAGNOSIS — I350 Nonrheumatic aortic (valve) stenosis: Secondary | ICD-10-CM | POA: Diagnosis present

## 2023-11-04 DIAGNOSIS — H3530 Unspecified macular degeneration: Secondary | ICD-10-CM | POA: Diagnosis present

## 2023-11-04 DIAGNOSIS — E43 Unspecified severe protein-calorie malnutrition: Secondary | ICD-10-CM | POA: Diagnosis present

## 2023-11-04 DIAGNOSIS — I272 Pulmonary hypertension, unspecified: Secondary | ICD-10-CM | POA: Diagnosis present

## 2023-11-04 DIAGNOSIS — D62 Acute posthemorrhagic anemia: Secondary | ICD-10-CM | POA: Diagnosis present

## 2023-11-04 DIAGNOSIS — M353 Polymyalgia rheumatica: Secondary | ICD-10-CM | POA: Diagnosis present

## 2023-11-04 DIAGNOSIS — Y93E1 Activity, personal bathing and showering: Secondary | ICD-10-CM | POA: Diagnosis not present

## 2023-11-04 DIAGNOSIS — J9611 Chronic respiratory failure with hypoxia: Secondary | ICD-10-CM | POA: Diagnosis present

## 2023-11-04 DIAGNOSIS — N179 Acute kidney failure, unspecified: Secondary | ICD-10-CM | POA: Diagnosis present

## 2023-11-04 DIAGNOSIS — S8012XA Contusion of left lower leg, initial encounter: Secondary | ICD-10-CM | POA: Diagnosis present

## 2023-11-04 DIAGNOSIS — I495 Sick sinus syndrome: Secondary | ICD-10-CM | POA: Diagnosis present

## 2023-11-04 DIAGNOSIS — Z8249 Family history of ischemic heart disease and other diseases of the circulatory system: Secondary | ICD-10-CM | POA: Diagnosis not present

## 2023-11-04 DIAGNOSIS — N184 Chronic kidney disease, stage 4 (severe): Secondary | ICD-10-CM | POA: Diagnosis present

## 2023-11-04 DIAGNOSIS — M810 Age-related osteoporosis without current pathological fracture: Secondary | ICD-10-CM | POA: Diagnosis present

## 2023-11-04 DIAGNOSIS — Z7901 Long term (current) use of anticoagulants: Secondary | ICD-10-CM | POA: Diagnosis not present

## 2023-11-04 DIAGNOSIS — Z681 Body mass index (BMI) 19 or less, adult: Secondary | ICD-10-CM | POA: Diagnosis not present

## 2023-11-04 DIAGNOSIS — W182XXA Fall in (into) shower or empty bathtub, initial encounter: Secondary | ICD-10-CM | POA: Diagnosis present

## 2023-11-04 DIAGNOSIS — Z23 Encounter for immunization: Secondary | ICD-10-CM | POA: Diagnosis present

## 2023-11-04 DIAGNOSIS — Z604 Social exclusion and rejection: Secondary | ICD-10-CM | POA: Diagnosis present

## 2023-11-04 DIAGNOSIS — I5032 Chronic diastolic (congestive) heart failure: Secondary | ICD-10-CM | POA: Diagnosis present

## 2023-11-04 DIAGNOSIS — Z66 Do not resuscitate: Secondary | ICD-10-CM | POA: Diagnosis present

## 2023-11-04 DIAGNOSIS — M316 Other giant cell arteritis: Secondary | ICD-10-CM | POA: Diagnosis present

## 2023-11-04 DIAGNOSIS — I130 Hypertensive heart and chronic kidney disease with heart failure and stage 1 through stage 4 chronic kidney disease, or unspecified chronic kidney disease: Secondary | ICD-10-CM | POA: Diagnosis present

## 2023-11-04 LAB — COMPREHENSIVE METABOLIC PANEL WITH GFR
ALT: 13 U/L (ref 0–44)
AST: 20 U/L (ref 15–41)
Albumin: 2.7 g/dL — ABNORMAL LOW (ref 3.5–5.0)
Alkaline Phosphatase: 85 U/L (ref 38–126)
Anion gap: 10 (ref 5–15)
BUN: 39 mg/dL — ABNORMAL HIGH (ref 8–23)
CO2: 24 mmol/L (ref 22–32)
Calcium: 8.7 mg/dL — ABNORMAL LOW (ref 8.9–10.3)
Chloride: 103 mmol/L (ref 98–111)
Creatinine, Ser: 1.78 mg/dL — ABNORMAL HIGH (ref 0.44–1.00)
GFR, Estimated: 26 mL/min — ABNORMAL LOW (ref >60)
Glucose, Bld: 99 mg/dL (ref 70–99)
Potassium: 4.7 mmol/L (ref 3.5–5.1)
Sodium: 137 mmol/L (ref 135–145)
Total Bilirubin: 0.8 mg/dL (ref 0.0–1.2)
Total Protein: 6.1 g/dL — ABNORMAL LOW (ref 6.5–8.1)

## 2023-11-04 LAB — SURGICAL PCR SCREEN
MRSA, PCR: POSITIVE — AB
Staphylococcus aureus: POSITIVE — AB

## 2023-11-04 MED ORDER — CHLORHEXIDINE GLUCONATE CLOTH 2 % EX PADS
6.0000 | MEDICATED_PAD | Freq: Every day | CUTANEOUS | Status: AC
  Administered 2023-11-04 – 2023-11-08 (×4): 6 via TOPICAL

## 2023-11-04 MED ORDER — ADULT MULTIVITAMIN W/MINERALS CH
1.0000 | ORAL_TABLET | Freq: Every day | ORAL | Status: DC
  Administered 2023-11-05 – 2023-11-09 (×4): 1 via ORAL
  Filled 2023-11-04 (×4): qty 1

## 2023-11-04 MED ORDER — MUPIROCIN 2 % EX OINT
1.0000 | TOPICAL_OINTMENT | Freq: Two times a day (BID) | CUTANEOUS | Status: AC
  Administered 2023-11-04 – 2023-11-09 (×10): 1 via NASAL
  Filled 2023-11-04 (×3): qty 22

## 2023-11-04 NOTE — H&P (View-Only) (Signed)
 ORTHOPAEDIC CONSULTATION  REQUESTING PHYSICIAN: Christobal Guadalajara, MD  Chief Complaint: Hematoma left leg.  HPI: Allison Norman is a 89 y.o. female who presents with large hematoma left leg.  Patient is on Eliquis  for A-fib.  Her rate is controlled approximately 60.  Patient states that she struck her leg on the shower wand on Monday and developed an immediate large hematoma.  Most recently patient was hospitalized from July 22 to July 30.  Past Medical History:  Diagnosis Date   Acute blood loss anemia 08/27/2020   Acute hypoxemic respiratory failure (HCC) 03/24/2021   Acute urinary retention 09/07/2020   Aortic stenosis 08/20/2021   Mild-moderate.  Repeat echo in one year.   Atrial fibrillation (HCC) 2017   a. s/p DCCV in 10/2015  b. recurrent in 08/2016 --> rate-control pursued.    Atrial fibrillation with RVR (HCC) 08/31/2015   Cancer (HCC)    Breast   CAP (community acquired pneumonia) 03/24/2021   CKD (chronic kidney disease)    GCA (giant cell arteritis) (HCC)    Hordeolum internum left lower eyelid 06/28/2019   Hypertension    Laceration of right lower extremity 08/28/2020   Macular degeneration    Methicillin resistant Staphylococcus aureus infection 07/02/2009   Annotation: septic right knee  Qualifier: Diagnosis of   By: Lutricia OBIE Aquas      IMO SNOMED Dx Update Oct 2024     Osteoporosis    PMR (polymyalgia rheumatica)    Pulmonary hypertension, unspecified (HCC) 02/14/2021   Resistant hypertension 03/15/2019   Shortness of breath 03/15/2019   Skin cancer    Past Surgical History:  Procedure Laterality Date   CARDIOVERSION N/A 10/18/2015   Procedure: CARDIOVERSION;  Surgeon: Oneil JAYSON Parchment, MD;  Location: [facility] ENDOSCOPY;  Service: Cardiovascular;  Laterality: N/A;   CARDIOVERSION N/A 02/20/2017   Procedure: CARDIOVERSION;  Surgeon: Mona Vinie JAYSON, MD;  Location: [facility] ENDOSCOPY;  Service: Cardiovascular;  Laterality: N/A;   CARDIOVERSION N/A 06/18/2017    Procedure: CARDIOVERSION;  Surgeon: Francyne Headland, MD;  Location: [facility] ENDOSCOPY;  Service: Cardiovascular;  Laterality: N/A;   CARDIOVERSION N/A 12/21/2018   Procedure: CARDIOVERSION;  Surgeon: Alveta Aleene PARAS, MD;  Location: [facility] ENDOSCOPY;  Service: Cardiovascular;  Laterality: N/A;   IR CATHETER TUBE CHANGE  02/28/2021   KNEE SURGERY  2013   MASTECTOMY  1998   left side   PACEMAKER IMPLANT N/A 07/22/2019   Procedure: PACEMAKER IMPLANT;  Surgeon: Waddell Danelle ORN, MD;  Location: [facility] INVASIVE CV LAB;  Service: Cardiovascular;  Laterality: N/A;   Social History   Socioeconomic History   Marital status: Married    Spouse name: Not on file   Number of children: 2   Years of education: Not on file   Highest education level: Not on file  Occupational History   Not on file  Tobacco Use   Smoking status: Former    Current packs/day: 0.00    Types: Cigarettes    Quit date: 08/31/1966    Years since quitting: 57.2   Smokeless tobacco: Never  Vaping Use   Vaping status: Never Used  Substance and Sexual Activity   Alcohol use: No   Drug use: No   Sexual activity: Not on file  Other Topics Concern   Not on file  Social History Narrative   epworth sleepiness scale = 8 (08/31/15)   Social Drivers of Health   Financial Resource Strain: Not on file  Food Insecurity: No Food Insecurity (11/03/2023)  Hunger Vital Sign    Worried About Running Out of Food in the Last Year: Never true    Ran Out of Food in the Last Year: Never true  Transportation Needs: No Transportation Needs (11/03/2023)   PRAPARE - Administrator, Civil Service (Medical): No    Lack of Transportation (Non-Medical): No  Physical Activity: Not on file  Stress: Not on file  Social Connections: Socially Isolated (11/03/2023)   Social Connection and Isolation Panel    Frequency of Communication with Friends and Family: Three times a week    Frequency of Social Gatherings with Friends and Family: More than  three times a week    Attends Religious Services: Never    Database Administrator or Organizations: No    Attends Banker Meetings: Never    Marital Status: Widowed   Family History  Problem Relation Age of Onset   Stroke Mother    Kidney failure Father        died at 15   Heart disease Sister        died at 60   Heart disease Sister        died at 26   Breast cancer Daughter    - negative except otherwise stated in the family history section Allergies  Allergen Reactions   Codeine Itching   Penicillins Itching and Rash   Apresoline  [Hydralazine ] Other (See Comments)    Felt bad Could hear/feel pulse in head Tinnitus    Cardura  [Doxazosin ] Other (See Comments)    Congestion Wheezing Heavy feeling in chest   Vibramycin [Doxycycline] Other (See Comments)    Chest tightness    Prior to Admission medications   Medication Sig Start Date End Date Taking? Authorizing Provider  amiodarone  (PACERONE ) 200 MG tablet Take 1/2 (one-half) tablet by mouth once daily 02/13/23  Yes Waddell Danelle ORN, MD  apixaban  (ELIQUIS ) 2.5 MG TABS tablet Take 1 tablet by mouth twice daily 09/14/23  Yes Raford Riggs, MD  diazepam  (VALIUM ) 2 MG tablet Take 2 mg by mouth See admin instructions. Take 1 tablet (2mg ) by mouth every evening. May also take 1 tablet during the day if needed for anxiety. 06/23/16  Yes [provider]  Famotidine  (PEPCID  AC PO) Take 1 tablet by mouth every evening.   Yes [provider]  furosemide  (LASIX ) 40 MG tablet Take 1.5 tablets (60 mg total) by mouth 2 (two) times daily. Patient taking differently: Take 20-60 mg by mouth See admin instructions. Take 1.5 tablets (60mg ) by mouth every morning and take 1/2 tablet (20mg ) every eveing. 08/05/23 12/12/23 Yes Laurence Locus, DO  iron  polysaccharides (FERREX 150) 150 MG capsule Take 150 mg by mouth 2 (two) times daily.   Yes [provider]  metoprolol  tartrate (LOPRESSOR ) 25 MG tablet Take 0.5  tablets (12.5 mg total) by mouth 2 (two) times daily. 08/05/23 12/12/23 Yes Laurence Locus, DO  potassium chloride  SA (KLOR-CON  M) 20 MEQ tablet Take 1 tablet (20 mEq total) by mouth daily. 10/19/23  Yes Swinyer, Rosaline HERO, NP   DG Tibia/Fibula Left Result Date: 11/02/2023 EXAM: 1 VIEW XRAY OF THE LEFT TIBIA AND FIBULA 11/02/2023 05:30:00 PM COMPARISON: None available. CLINICAL HISTORY: Shin injury. Patient brought in by [facility] for evaluation of large hematoma to left shin. Patient advises she was getting out of the shower, showerhead hit left shin. Patient reports pain to same, with palpation. FINDINGS: BONES AND JOINTS: Mild degenerative changes of the knee. No acute  fracture. No focal osseous lesion. No joint dislocation. SOFT TISSUES: An area of soft tissue density measuring up to 10 cm is seen in the anterior lower extremity, likely related to hematoma. IMPRESSION: 1. No acute fracture. 2. Soft tissue density measuring up to 10 cm in the anterior lower extremity, likely related to hematoma. Electronically signed by: Greig Pique MD 11/02/2023 06:03 PM EDT RP Workstation: [id number]   - pertinent xrays, CT, MRI studies were reviewed and independently interpreted  Positive ROS: All other systems have been reviewed and were otherwise negative with the exception of those mentioned in the HPI and as above.  Physical Exam: General: Alert, no acute distress Psychiatric: Patient is competent for consent with normal mood and affect Lymphatic: No axillary or cervical lymphadenopathy Cardiovascular: No pedal edema Respiratory: No cyanosis, no use of accessory musculature GI: No organomegaly, abdomen is soft and non-tender    Images:  @ENCIMAGES @  Labs:  Lab Results  Component Value Date   ESRSEDRATE 55 (H) 08/14/2010   ESRSEDRATE 32 (H) 01/21/2010   ESRSEDRATE 123 (H) 09/27/2009   CRP 0.7 (H) 08/14/2010   CRP 0.5 01/21/2010   CRP 24.3 (H) 09/27/2009   REPTSTATUS 03/14/2023 FINAL 03/09/2023    GRAMSTAIN  03/25/2021    FEW WBC PRESENT, PREDOMINANTLY PMN FEW GRAM POSITIVE COCCI IN PAIRS IN CLUSTERS Performed at [facility], [address] [place], [place] [address]    CULT  03/09/2023    NO GROWTH 5 DAYS Performed at [facility], [address], [place], [place] [address]    LABORGA METHICILLIN RESISTANT STAPHYLOCOCCUS AUREUS 03/25/2021    Lab Results  Component Value Date   ALBUMIN  2.7 (L) 11/04/2023   ALBUMIN  3.1 (L) 11/03/2023   ALBUMIN  2.7 (L) 07/28/2023   PREALBUMIN 10.3 (L) 03/25/2021        Latest Ref Rng & Units 11/03/2023    3:47 PM 11/02/2023    4:09 PM 08/03/2023   10:13 AM  CBC EXTENDED  WBC 4.0 - 10.5 K/uL  6.6  3.6   RBC 3.87 - 5.11 MIL/uL  4.45  4.09   Hemoglobin 12.0 - 15.0 g/dL 87.7  86.8  89.1   HCT 36.0 - 46.0 % 38.8  41.6  36.2   Platelets 150 - 400 K/uL  236  149   NEUT# 1.7 - 7.7 K/uL  3.7  2.5   Lymph# 0.7 - 4.0 K/uL  2.0  0.7     Neurologic: Patient does not have protective sensation bilateral lower extremities.   MUSCULOSKELETAL:   Skin: Examination patient has a massive hematoma over the left tibia.  There is no surrounding cellulitis no purulent drainage.  I cannot palpate a pulse on her foot but her foot is neurovasc intact with good capillary refill.  Radiograph shows no bony involvement.  Hemoglobin 12.2.  Assessment: Assessment: Large hematoma left leg.  Plan: Plan: Will plan for debridement of hematoma on Friday.  Plan for application of biologic tissue graft negative pressure wound therapy.  Patient may be full weightbearing after surgery and plan for discharge with the portable Prevena pump.  Thank you for the consult and the opportunity to see Ms. Lebron Jerona Sage, MD [facility] [phone] 1:24 PM

## 2023-11-04 NOTE — Progress Notes (Signed)
 Initial Nutrition Assessment  DOCUMENTATION CODES:   Severe malnutrition in context of chronic illness  INTERVENTION:  Liberalize diet to regular to allow for more meal options and encourage PO intake  Add MVI w/minerals    NUTRITION DIAGNOSIS:   Severe Malnutrition related to chronic illness as evidenced by percent weight loss, severe muscle depletion, severe fat depletion, energy intake < or equal to 75% for > or equal to 1 month.   GOAL:   Patient will meet greater than or equal to 90% of their needs   MONITOR:   PO intake, Weight trends, Labs  REASON FOR ASSESSMENT:   Consult Assessment of nutrition requirement/status  ASSESSMENT:   PMH of Afib s/p DCCV in 2017 with re-occurrence in 2018, HTN, CKD3b, and breast/skin cancer, chronic resp failure on 2L [place], aortic stenosis presenting w/ pain and wound on LLE after she fell when a shower line fell out of her hand swinga and folded on her left shin. She developed intermediate large bruise over the affected area.  Met with patient in room, caregiver at bedside. Pt reports not having the best appetite at home, would normally eat a bowl of oatmeal for breakfast, have a small  snack and then eat whatever her caregiver would make for dinner. Dislikes ensure drinks and does not want with meals. Pt currently on heart/carb modified diet, discussed liberalizing diet with patient to allow more food options to choose from. Pt reports that she ate all of her oatmeal for breakfast, she ordered a sandwich for lunch she has not eaten yet because her meal tray was brought too early. Patient reports usually weight of ~ 125 lbs without fluid retention, does endorse ongoing weight loss. 16% weight loss x 8 months, clinically significant.    Admit weight: 47.6 kg  Current weight: 47.6 kg  Nutritionally Relevant Medications: lasix   Labs Reviewed: BUN 39, Cr 1.78, Calcium  8.7   NUTRITION - FOCUSED PHYSICAL EXAM:  Flowsheet Row Most Recent  Value  Orbital Region Moderate depletion  Upper Arm Region Severe depletion  Thoracic and Lumbar Region Severe depletion  Buccal Region Moderate depletion  Temple Region Severe depletion  Clavicle Bone Region Moderate depletion  Clavicle and Acromion Bone Region Severe depletion  Scapular Bone Region Severe depletion  Dorsal Hand Moderate depletion  Patellar Region Moderate depletion  Anterior Thigh Region Moderate depletion  Posterior Calf Region Severe depletion  Hair Reviewed  Eyes Reviewed  Mouth Reviewed  Skin Reviewed  Nails Reviewed    Diet Order:   Diet Order             Diet regular Room service appropriate? Yes with Assist; Fluid consistency: Thin  Diet effective now                   EDUCATION NEEDS:   No education needs have been identified at this time  Skin:  Skin Assessment: Skin Integrity Issues: Skin Integrity Issues:: DTI, Other (Comment) DTI: sacrum Other: pretibal hematoma s/p fall  Last BM:  PTA  Height:   Ht Readings from Last 1 Encounters:  11/03/23 5' 5.5 (1.664 m)    Weight:   Wt Readings from Last 1 Encounters:  11/03/23 47.6 kg    BMI:  Body mass index is 17.21 kg/m.  Estimated Nutritional Needs:   Kcal:  1428-1650 kcals  Protein:  48-57 grams  Fluid:  1.4-1.6L/d  Madalyn Potters, MS, RD, LDN Clinical Dietitian  Contact via secure chat. If unavailable, use group chat RD Inpatient.

## 2023-11-04 NOTE — Progress Notes (Signed)
 PROGRESS NOTE Allison Norman  FMW:[id number] DOB: 30-Aug-1924 DOA: 11/02/2023 PCP: Allison Carlin Redbird, MD  Brief Narrative/Hospital Course: Allison Norman is a 89 y.o. female with PMH of Afib s/p DCCV in 2017 with re-occurrence in 2018, HTN, CKD3b, and breast/skin cancer, chronic resp failure on 2L [place], aortic stenosis presenting w/ pain and wound on LLE after she fell when a shower line fell out of her hand swinga and folded on her left shin. She developed intermediate large bruise over the affected area. Patient noted to have been admitted July 22 through July 30 for issues including acute on chronic heart failure to which she is placed in the hospital home program and otherwise stable course.Has a around-the-clock caregiver support at home.   in ER: afebrile, hemodynamically stable.  Satting 88% on room air.  Transitioned to 2 L nasal cannula to keep O2 sats.  96%.  White count 6.6, hemoglobin 13.1, platelets 236.  Creatinine 1.9.  Left tib-fib showing no acute fracture and soft tissue density measuring 10, anterior extremity concerning for hematoma.  XRAY TIBIA/Fibula: No acute fracture. Soft tissue density measuring up to 10 cm in the anterior lower extremity, likely related to hematoma.   Subjective: Seen and examined today Left leg pain is controlled, caregiver at the bedside Overnight on RA ,  afebrile, VSS Sbp 100-150s, Labs  creat 1.9> 1.7 hb stable at 12g  Assessment and plan:  Posttraumatic pretibial hematoma of left lower extremity, initial encounter LLE hematoma s/p traumatic strike with shower wand:  Left lower extremity x-ray stable, soft tissue hematoma noted dressing intact  Discussed with Allison Norman - he will have Allison Norman evaluate her wound/hematoma given skin overlying has opened up  Continue pain management continue to monitor hemoglobin wound care has been consulted   CKD  stage IV: baseline scr 1.7-2.0, stable, Recent Labs    07/30/23 1131 07/31/23 1022  08/01/23 1258 08/02/23 1114 08/03/23 1013 08/04/23 1018 08/10/23 1313 11/02/23 1609 11/03/23 1547 11/04/23 0406  BUN 37* 36* 35* 36* 33* 32* 32 44* 40* 39*  CREATININE 1.95* 1.94* 1.76* 1.82* 1.73* 1.75* 1.60* 1.90* 1.71* 1.78*  CO2 31 32 32 30 30 32 27 27 26  24  K 4.1 4.3 4.2 4.2 4.3 3.9 4.8 4.6 5.0 4.7     PAF: Baseline atrial fibrillation on eliquis  and amiodarone   Holding eliquis  in setting of large LLE traumatic hematoma    Chronic respiratory failure with hypoxia: On 2L [place] chronically , at baseline Monitor    Chronic diastolic CHF 2D ECHO 03/2022 w/ EF 65-70% and grade 2 diastolic dysfunction.  Appears euvolemic  cont to monitor volume status with treatment   Severe malnutrition w/ Body mass index is 17.21 kg/m.: RD consulted  Mobility: PT Orders: Active  PT Follow up Rec:    DVT prophylaxis:  Code Status:   Code Status: Limited: Do not attempt resuscitation (DNR) -DNR-LIMITED -Do Not Intubate/DNI  Family Communication: plan of care discussed with patient/caregiver at bedside. Patient status is: Remains hospitalized because of severity of illness Level of care: Telemetry Cardiac   Dispo: The patient is from: home            Anticipated disposition: TBD Objective: Vitals last 24 hrs: Vitals:   11/04/23 0100 11/04/23 0500 11/04/23 0827 11/04/23 0911  BP: (!) 159/74 (!) 100/51 (!) 121/57 (!) 111/53  Pulse:   (!) 58 60  Resp:   17 16  Temp: 97.9 F (36.6 C) 97.6 F (36.4 C) 98.7 F (  37.1 C)   TempSrc: Oral Oral Oral   SpO2:    96%  Weight:      Height:        Physical Examination: General exam: alert awake, oriented, older than stated age HEENT:Oral mucosa moist, Ear/Nose WNL grossly Respiratory system: Bilaterally clear BS,no use of accessory muscle Cardiovascular system: S1 & S2 +, No JVD. Gastrointestinal system: Abdomen soft,NT,ND, BS+ Nervous System: Alert, awake, moving all extremities,and following commands. Extremities: extremities warm, leg  edema neg-left shin with area of hematoma, dressing intact mild bleeding Skin: Warm, no rashes MSK: Normal muscle bulk,tone, power   Medications reviewed:  Scheduled Meds:  acetaminophen   650 mg Oral Once   amiodarone   100 mg Oral Daily   furosemide   60 mg Oral Daily   metoprolol  tartrate  12.5 mg Oral BID   pantoprazole   40 mg Oral Daily   potassium chloride  SA  20 mEq Oral Daily   Continuous Infusions: Diet: Diet Order             Diet heart healthy/carb modified Room service appropriate? Yes; Fluid consistency: Thin  Diet effective now                    Data Reviewed: I have personally reviewed following labs and imaging studies ( see epic result tab) CBC: Recent Labs  Lab 11/02/23 1609 11/03/23 1547  WBC 6.6  --   NEUTROABS 3.7  --   HGB 13.1 12.2  HCT 41.6 38.8  MCV 93.5  --   PLT 236  --    CMP: Recent Labs  Lab 11/02/23 1609 11/03/23 1547 11/04/23 0406  NA 138 140 137  K 4.6 5.0 4.7  CL 99 105 103  CO2 27 26 24   GLUCOSE 97 110* 99  BUN 44* 40* 39*  CREATININE 1.90* 1.71* 1.78*  CALCIUM  9.4 8.5* 8.7*   GFR: Estimated Creatinine Clearance: 13.6 mL/min (A) (by C-G formula based on SCr of 1.78 mg/dL (H)). Recent Labs  Lab 11/03/23 1547 11/04/23 0406  AST 21 20  ALT 15 13  ALKPHOS 95 85  BILITOT 1.0 0.8  PROT 6.8 6.1*  ALBUMIN  3.1* 2.7*   No results for input(s): LIPASE, AMYLASE in the last 168 hours. No results for input(s): AMMONIA in the last 168 hours. Coagulation Profile:  Recent Labs  Lab 11/02/23 1609  INR 1.2   Unresulted Labs (From admission, onward)     Start     Ordered   11/05/23 0500  Basic metabolic panel with GFR  Daily,   R      11/04/23 0642   11/05/23 0500  CBC  Daily,   R      11/04/23 9357           Antimicrobials/Microbiology: Anti-infectives (From admission, onward)    None         Component Value Date/Time   SDES BLOOD RIGHT ARM 03/09/2023 2106   SPECREQUEST  03/09/2023 2106    BOTTLES DRAWN  AEROBIC ONLY Blood Culture results may not be optimal due to an inadequate volume of blood received in culture bottles   CULT  03/09/2023 2106    NO GROWTH 5 DAYS Performed at [facility], [address] [place], [place] [address]    REPTSTATUS 03/14/2023 FINAL 03/09/2023 2106    Procedures:    Mennie LAMY, MD [facility] 11/04/2023, 10:06 AM

## 2023-11-04 NOTE — Care Management Obs Status (Signed)
 MEDICARE OBSERVATION STATUS NOTIFICATION   Patient Details  Name: Allison Norman MRN: [id number] Date of Birth: 09/03/1924   Medicare Observation Status Notification Given:  No    Vonzell Arrie Sharps 11/04/2023, 8:52 AM

## 2023-11-04 NOTE — Plan of Care (Signed)

## 2023-11-04 NOTE — Consult Note (Signed)
 ORTHOPAEDIC CONSULTATION  REQUESTING PHYSICIAN: Christobal Guadalajara, MD  Chief Complaint: Hematoma left leg.  HPI: Allison Norman is a 89 y.o. female who presents with large hematoma left leg.  Patient is on Eliquis  for A-fib.  Her rate is controlled approximately 60.  Patient states that she struck her leg on the shower wand on Monday and developed an immediate large hematoma.  Most recently patient was hospitalized from July 22 to July 30.  Past Medical History:  Diagnosis Date   Acute blood loss anemia 08/27/2020   Acute hypoxemic respiratory failure (HCC) 03/24/2021   Acute urinary retention 09/07/2020   Aortic stenosis 08/20/2021   Mild-moderate.  Repeat echo in one year.   Atrial fibrillation (HCC) 2017   a. s/p DCCV in 10/2015  b. recurrent in 08/2016 --> rate-control pursued.    Atrial fibrillation with RVR (HCC) 08/31/2015   Cancer (HCC)    Breast   CAP (community acquired pneumonia) 03/24/2021   CKD (chronic kidney disease)    GCA (giant cell arteritis) (HCC)    Hordeolum internum left lower eyelid 06/28/2019   Hypertension    Laceration of right lower extremity 08/28/2020   Macular degeneration    Methicillin resistant Staphylococcus aureus infection 07/02/2009   Annotation: septic right knee  Qualifier: Diagnosis of   By: Lutricia OBIE Aquas      IMO SNOMED Dx Update Oct 2024     Osteoporosis    PMR (polymyalgia rheumatica)    Pulmonary hypertension, unspecified (HCC) 02/14/2021   Resistant hypertension 03/15/2019   Shortness of breath 03/15/2019   Skin cancer    Past Surgical History:  Procedure Laterality Date   CARDIOVERSION N/A 10/18/2015   Procedure: CARDIOVERSION;  Surgeon: Oneil JAYSON Parchment, MD;  Location: [facility] ENDOSCOPY;  Service: Cardiovascular;  Laterality: N/A;   CARDIOVERSION N/A 02/20/2017   Procedure: CARDIOVERSION;  Surgeon: Mona Vinie JAYSON, MD;  Location: [facility] ENDOSCOPY;  Service: Cardiovascular;  Laterality: N/A;   CARDIOVERSION N/A 06/18/2017    Procedure: CARDIOVERSION;  Surgeon: Francyne Headland, MD;  Location: [facility] ENDOSCOPY;  Service: Cardiovascular;  Laterality: N/A;   CARDIOVERSION N/A 12/21/2018   Procedure: CARDIOVERSION;  Surgeon: Alveta Aleene PARAS, MD;  Location: [facility] ENDOSCOPY;  Service: Cardiovascular;  Laterality: N/A;   IR CATHETER TUBE CHANGE  02/28/2021   KNEE SURGERY  2013   MASTECTOMY  1998   left side   PACEMAKER IMPLANT N/A 07/22/2019   Procedure: PACEMAKER IMPLANT;  Surgeon: Waddell Danelle ORN, MD;  Location: [facility] INVASIVE CV LAB;  Service: Cardiovascular;  Laterality: N/A;   Social History   Socioeconomic History   Marital status: Married    Spouse name: Not on file   Number of children: 2   Years of education: Not on file   Highest education level: Not on file  Occupational History   Not on file  Tobacco Use   Smoking status: Former    Current packs/day: 0.00    Types: Cigarettes    Quit date: 08/31/1966    Years since quitting: 57.2   Smokeless tobacco: Never  Vaping Use   Vaping status: Never Used  Substance and Sexual Activity   Alcohol use: No   Drug use: No   Sexual activity: Not on file  Other Topics Concern   Not on file  Social History Narrative   epworth sleepiness scale = 8 (08/31/15)   Social Drivers of Health   Financial Resource Strain: Not on file  Food Insecurity: No Food Insecurity (11/03/2023)  Hunger Vital Sign    Worried About Running Out of Food in the Last Year: Never true    Ran Out of Food in the Last Year: Never true  Transportation Needs: No Transportation Needs (11/03/2023)   PRAPARE - Administrator, Civil Service (Medical): No    Lack of Transportation (Non-Medical): No  Physical Activity: Not on file  Stress: Not on file  Social Connections: Socially Isolated (11/03/2023)   Social Connection and Isolation Panel    Frequency of Communication with Friends and Family: Three times a week    Frequency of Social Gatherings with Friends and Family: More than  three times a week    Attends Religious Services: Never    Database Administrator or Organizations: No    Attends Banker Meetings: Never    Marital Status: Widowed   Family History  Problem Relation Age of Onset   Stroke Mother    Kidney failure Father        died at 15   Heart disease Sister        died at 60   Heart disease Sister        died at 26   Breast cancer Daughter    - negative except otherwise stated in the family history section Allergies  Allergen Reactions   Codeine Itching   Penicillins Itching and Rash   Apresoline  [Hydralazine ] Other (See Comments)    Felt bad Could hear/feel pulse in head Tinnitus    Cardura  [Doxazosin ] Other (See Comments)    Congestion Wheezing Heavy feeling in chest   Vibramycin [Doxycycline] Other (See Comments)    Chest tightness    Prior to Admission medications   Medication Sig Start Date End Date Taking? Authorizing Provider  amiodarone  (PACERONE ) 200 MG tablet Take 1/2 (one-half) tablet by mouth once daily 02/13/23  Yes Waddell Danelle ORN, MD  apixaban  (ELIQUIS ) 2.5 MG TABS tablet Take 1 tablet by mouth twice daily 09/14/23  Yes Raford Riggs, MD  diazepam  (VALIUM ) 2 MG tablet Take 2 mg by mouth See admin instructions. Take 1 tablet (2mg ) by mouth every evening. May also take 1 tablet during the day if needed for anxiety. 06/23/16  Yes [provider]  Famotidine  (PEPCID  AC PO) Take 1 tablet by mouth every evening.   Yes [provider]  furosemide  (LASIX ) 40 MG tablet Take 1.5 tablets (60 mg total) by mouth 2 (two) times daily. Patient taking differently: Take 20-60 mg by mouth See admin instructions. Take 1.5 tablets (60mg ) by mouth every morning and take 1/2 tablet (20mg ) every eveing. 08/05/23 12/12/23 Yes Laurence Locus, DO  iron  polysaccharides (FERREX 150) 150 MG capsule Take 150 mg by mouth 2 (two) times daily.   Yes [provider]  metoprolol  tartrate (LOPRESSOR ) 25 MG tablet Take 0.5  tablets (12.5 mg total) by mouth 2 (two) times daily. 08/05/23 12/12/23 Yes Laurence Locus, DO  potassium chloride  SA (KLOR-CON  M) 20 MEQ tablet Take 1 tablet (20 mEq total) by mouth daily. 10/19/23  Yes Swinyer, Rosaline HERO, NP   DG Tibia/Fibula Left Result Date: 11/02/2023 EXAM: 1 VIEW XRAY OF THE LEFT TIBIA AND FIBULA 11/02/2023 05:30:00 PM COMPARISON: None available. CLINICAL HISTORY: Shin injury. Patient brought in by [facility] for evaluation of large hematoma to left shin. Patient advises she was getting out of the shower, showerhead hit left shin. Patient reports pain to same, with palpation. FINDINGS: BONES AND JOINTS: Mild degenerative changes of the knee. No acute  fracture. No focal osseous lesion. No joint dislocation. SOFT TISSUES: An area of soft tissue density measuring up to 10 cm is seen in the anterior lower extremity, likely related to hematoma. IMPRESSION: 1. No acute fracture. 2. Soft tissue density measuring up to 10 cm in the anterior lower extremity, likely related to hematoma. Electronically signed by: Greig Pique MD 11/02/2023 06:03 PM EDT RP Workstation: [id number]   - pertinent xrays, CT, MRI studies were reviewed and independently interpreted  Positive ROS: All other systems have been reviewed and were otherwise negative with the exception of those mentioned in the HPI and as above.  Physical Exam: General: Alert, no acute distress Psychiatric: Patient is competent for consent with normal mood and affect Lymphatic: No axillary or cervical lymphadenopathy Cardiovascular: No pedal edema Respiratory: No cyanosis, no use of accessory musculature GI: No organomegaly, abdomen is soft and non-tender    Images:  @ENCIMAGES @  Labs:  Lab Results  Component Value Date   ESRSEDRATE 55 (H) 08/14/2010   ESRSEDRATE 32 (H) 01/21/2010   ESRSEDRATE 123 (H) 09/27/2009   CRP 0.7 (H) 08/14/2010   CRP 0.5 01/21/2010   CRP 24.3 (H) 09/27/2009   REPTSTATUS 03/14/2023 FINAL 03/09/2023    GRAMSTAIN  03/25/2021    FEW WBC PRESENT, PREDOMINANTLY PMN FEW GRAM POSITIVE COCCI IN PAIRS IN CLUSTERS Performed at [facility], [address] [place], [place] [address]    CULT  03/09/2023    NO GROWTH 5 DAYS Performed at [facility], [address], [place], [place] [address]    LABORGA METHICILLIN RESISTANT STAPHYLOCOCCUS AUREUS 03/25/2021    Lab Results  Component Value Date   ALBUMIN  2.7 (L) 11/04/2023   ALBUMIN  3.1 (L) 11/03/2023   ALBUMIN  2.7 (L) 07/28/2023   PREALBUMIN 10.3 (L) 03/25/2021        Latest Ref Rng & Units 11/03/2023    3:47 PM 11/02/2023    4:09 PM 08/03/2023   10:13 AM  CBC EXTENDED  WBC 4.0 - 10.5 K/uL  6.6  3.6   RBC 3.87 - 5.11 MIL/uL  4.45  4.09   Hemoglobin 12.0 - 15.0 g/dL 87.7  86.8  89.1   HCT 36.0 - 46.0 % 38.8  41.6  36.2   Platelets 150 - 400 K/uL  236  149   NEUT# 1.7 - 7.7 K/uL  3.7  2.5   Lymph# 0.7 - 4.0 K/uL  2.0  0.7     Neurologic: Patient does not have protective sensation bilateral lower extremities.   MUSCULOSKELETAL:   Skin: Examination patient has a massive hematoma over the left tibia.  There is no surrounding cellulitis no purulent drainage.  I cannot palpate a pulse on her foot but her foot is neurovasc intact with good capillary refill.  Radiograph shows no bony involvement.  Hemoglobin 12.2.  Assessment: Assessment: Large hematoma left leg.  Plan: Plan: Will plan for debridement of hematoma on Friday.  Plan for application of biologic tissue graft negative pressure wound therapy.  Patient may be full weightbearing after surgery and plan for discharge with the portable Prevena pump.  Thank you for the consult and the opportunity to see Ms. Lebron Jerona Sage, MD [facility] [phone] 1:24 PM

## 2023-11-04 NOTE — Hospital Course (Addendum)
 Allison Norman is a 89 y.o. female with PMH of Afib s/p DCCV in 2017 with re-occurrence in 2018, HTN, CKD3b, and breast/skin cancer, chronic resp failure on 2L [place], aortic stenosis presenting w/ pain and wound on LLE after she fell when a shower line fell out of her hand swinga and folded on her left shin. She developed intermediate large bruise over the affected area. Patient noted to have been admitted July 22 through July 30 for issues including acute on chronic heart failure to which she is placed in the hospital home program and otherwise stable course.Has a around-the-clock caregiver support at home.  in ER: afebrile, hemodynamically stable.  Satting 88% on room air.  Transitioned to 2 L nasal cannula to keep O2 sats.  96%.  White count 6.6, hemoglobin 13.1, platelets 236.  Creatinine 1.9.  Left tib-fib showing no acute fracture and soft tissue density measuring 10, anterior extremity concerning for hematoma. XRAY TIBIA/Fibula: No acute fracture. Soft tissue density measuring up to 10 cm in the anterior lower extremity likely related to hematoma. Dr. Harden has been consulted> underwent debridement Haemate evacuation and wound VAC placement by Dr. Harden and advised weightbearing as tolerated and discharged home on Prevena wound VAC and wound care.  Patient was monitored due to her renal dysfunction creatinine now down to 1.8 which appears to be her baseline  Subjective: Seen and examined Resting comfortably caregiver at the bedside. Pain control on the left leg, eager to go home today Overnight afebrile and function improving 1.8 BP stable  Discharge Diagnoses:  Posttraumatic pretibial hematoma of left lower extremity, initial encounter LLE hematoma s/p traumatic strike with shower wand ABLA in the setting of chronic anemia due to #1: Left lower extremity x-ray stable seen by Dr Harden S/p excisional debridement hematoma left leg application of Kerecis micro graft, wound VAC Left lower extremity on  wound VAC, okay for weightbearing on  LLE Plan for discharge w/ Prevena with portable wound VAC.  Resume Eliquis - discussed w/ Dr Harden  Recent Labs  Lab 11/05/23 9766 11/06/23 0422 11/07/23 0430 11/08/23 0520 11/09/23 0447  HGB 10.6* 9.9* 10.2* 10.6* 9.4*  HCT 33.7* 31.9* 32.1* 34.4* 30.1*     AKI  vs progressive CKD  stage IV: baseline scr 1.7-2.0,-when looking blood work back in July 25. Lasix  held 10/30, creatinine has been fluctuating-1.9-2.1 range-no significant changes despite IV fluids.no episode of hypotension no nephrotoxic meds use.I suspect this is progressive CKD close to baseline with some component of AKI .Creatinine improved 1.8 suspect is at baseline.  Advise outpatient follow-up. Resume her diuretics I on 11/5 Recent Labs    08/04/23 1018 08/10/23 1313 11/02/23 1609 11/03/23 1547 11/04/23 0406 11/05/23 0233 11/06/23 0422 11/07/23 0430 11/08/23 0520 11/09/23 0447  BUN 32* 32 44* 40* 39* 44* 46* 45* 51* 44*  CREATININE 1.75* 1.60* 1.90* 1.71* 1.78* 2.39* 2.05* 1.97* 2.19* 1.85*  CO2 32 27 27 26 24 26 23 22 23  23  K 3.9 4.8 4.6 5.0 4.7 4.8 4.5 4.9 4.4 4.6     PAF: Baseline atrial fibrillation on eliquis  and amiodarone  . Hed eliquis  in setting of large LLE traumatic hematoma> okay with Dr. Harden to  resume anticoagulation   Chronic respiratory failure with hypoxia: On 2L [place] chronically , at baseline Monitor    Chronic diastolic CHF 2D ECHO 03/2022 w/ EF 65-70% and grade 2 diastolic dysfunction.  Holding Lasix  due to AKI  Severe malnutrition w/ Body mass index is 17.21 kg/m.: RD consulted  Mobility: PT Orders: Active  PT Follow up Rec: Home Health Pt11/01/2023 1646   DVT prophylaxis:  Code Status:   Code Status: Limited: Do not attempt resuscitation (DNR) -DNR-LIMITED -Do Not Intubate/DNI  Family Communication: plan of care discussed with patient/caregiver at bedside . Patient status is: Remains hospitalized because of severity of illness Level of care:  Telemetry   Dispo: The patient is from: home            Anticipated disposition:  home today Objective: Vitals last 24 hrs: Vitals:   11/08/23 2100 11/08/23 2307 11/09/23 0456 11/09/23 0700  BP: 117/80 111/72 (!) 116/59 (!) 114/53  Pulse:  (!) 59 60   Resp:  (!) 22 20 18   Temp:  98.5 F (36.9 C) 98.4 F (36.9 C) 98.4 F (36.9 C)  TempSrc:  Oral Oral Oral  SpO2:  95% 98%   Weight:      Height:       Physical Examination: General exam: AAOX3, thin HEENT:Oral mucosa moist, Ear/Nose WNL grossly Respiratory system: Bilaterally clear BS,no use of accessory muscle Cardiovascular system: S1 & S2 +, No JVD. Gastrointestinal system: Abdomen soft,NT,ND, BS+ Nervous System: Alert, awake non focal Extremities: extremities warm, LLE surgical site/wound site with wound VAC in place  Skin: Warm, no rashes MSK: Normal muscle bulk,tone, power

## 2023-11-04 NOTE — TOC Initial Note (Addendum)
 Transition of Care [facility]) - Initial/Assessment Note    Patient Details  Name: Allison Norman MRN: [id number] Date of Birth: November 23, 1924  [facility] CM/SW Contact:    Sudie Erminio Deems, RN Phone Number: 11/04/2023, 1:15 PM  Clinical Narrative: Patient presented for LLE wound hematoma. PTA patient was from home with 24 hour caregivers. Patient has support of family that checks in often. Patient states she has DME: rolling walker and oxygen  qhs. Patient reports that her caregivers take her to physician appointments. Physical and Occupational Therapy have been consulted. Inpatient Case Manager will continue to follow for recommendations and additional disposition needs.                1427 11-04-23 Per MD Duda's note, plan will be for debridement of hematoma on Friday and portable prevena pump. If the patient has recommendations for [facility] her preference is [facility]. Inpatient Case Manager will continue to follow for additional needs.    11-04-23 1627 HH recommendations are for PT/OT- Referral submitted via the hub for services. Start of care to begin within 24-48 hours post transition home. ICM will continue to follow for additional needs.  Expected Discharge Plan: Home w Home Health Services Barriers to Discharge: Continued Medical Work up  Expected Discharge Plan and Services In-house Referral: NA Discharge Planning Services: CM Consult Post Acute Care Choice: Home Health Living arrangements for the past 2 months: Single Family Home                   DME Agency: NA  Prior Living Arrangements/Services Living arrangements for the past 2 months: Single Family Home Lives with:: Other (Comment) (24 hour caregivers in the home.) Patient language and need for interpreter reviewed:: Yes Do you feel safe going back to the place where you live?: Yes      Need for Family Participation in Patient Care: Yes (Comment) Care giver support system in place?: Yes  (comment)   Criminal Activity/Legal Involvement Pertinent to Current Situation/Hospitalization: No - Comment as needed  Activities of Daily Living   ADL Screening (condition at time of admission) Independently performs ADLs?: No Does the patient have a NEW difficulty with bathing/dressing/toileting/self-feeding that is expected to last >3 days?: Yes (Initiates electronic notice to provider for possible OT consult) Does the patient have a NEW difficulty with getting in/out of bed, walking, or climbing stairs that is expected to last >3 days?: Yes (Initiates electronic notice to provider for possible PT consult) Does the patient have a NEW difficulty with communication that is expected to last >3 days?: Yes (Initiates electronic notice to provider for possible SLP consult) Is the patient deaf or have difficulty hearing?: No Does the patient have difficulty seeing, even when wearing glasses/contacts?: No Does the patient have difficulty concentrating, remembering, or making decisions?: No  Permission Sought/Granted Permission sought to share information with : Family Supports, Case Manager   Emotional Assessment Appearance:: Appears stated age Attitude/Demeanor/Rapport: Engaged Affect (typically observed): Appropriate Orientation: : Oriented to Self, Oriented to Place, Oriented to  Time, Oriented to Situation Alcohol / Substance Use: Not Applicable Psych Involvement: No (comment)  Admission diagnosis:  Hematoma of left lower extremity, initial encounter [S80.12XA] Posttraumatic pretibial hematoma of left lower extremity, initial encounter [S80.12XA] Patient Active Problem List   Diagnosis Date Noted   Chronic respiratory failure with hypoxia (HCC) 11/03/2023   Posttraumatic pretibial hematoma of left lower extremity, initial encounter 11/02/2023   Edema due to hypoalbuminemia 08/05/2023   Acute  on chronic diastolic CHF (congestive heart failure) (HCC) 07/28/2023   Aortic stenosis  08/20/2021   Blepharitis of lower eyelids of both eyes 07/23/2021   Protein-calorie malnutrition, severe 03/27/2021   Acute on chronic respiratory failure with hypoxia (HCC) - chronically on 2 L/min 03/24/2021   History of pulmonary embolus (PE) 03/24/2021   Pulmonary nodule 03/24/2021   Chronic diastolic CHF (congestive heart failure) (HCC) 03/24/2021   Pulmonary hypertension, unspecified (HCC) 02/14/2021   Vitreomacular traction syndrome of right eye 01/28/2021   Iron  deficiency anemia 09/07/2020   CKD (chronic kidney disease), stage IV (HCC) - baseline scr 1.7-2.0    Advanced nonexudative age-related macular degeneration of right eye without subfoveal involvement 03/15/2020   Pacemaker 11/01/2019   Tachycardia-bradycardia syndrome (HCC) 08/05/2019   Bradycardia 07/05/2019   Meibomian blepharitis, left 06/28/2019   Exudative age-related macular degeneration of right eye with active choroidal neovascularization (HCC) 05/03/2019   Cystoid macular edema of right eye 05/03/2019   Advanced nonexudative age-related macular degeneration of left eye with subfoveal involvement 05/03/2019   Fusion beats 08/03/2017   Atypical atrial flutter (HCC)    Persistent atrial fibrillation (HCC) 08/19/2016   On anticoagulant therapy 07/10/2016   PAF (paroxysmal atrial fibrillation) (HCC)    Essential hypertension 07/16/2009   TEMPORAL ARTERITIS 07/16/2009   Polymyalgia rheumatica 07/16/2009   PYOGENIC ARTHRITIS, LOWER LEG 07/02/2009   PCP:  Okey Carlin Redbird, MD Pharmacy:   [facility] [address] [place] - [address] [place] [address] Phone: [phone] Fax: [phone]  [facility] [address] [place] [address] Phone: [phone] Fax: [phone]     Social Drivers of Health (SDOH) Social History: SDOH Screenings   Food Insecurity: No Food Insecurity (11/03/2023)  Housing: Low Risk   (11/03/2023)  Transportation Needs: No Transportation Needs (11/03/2023)  Utilities: Not At Risk (11/03/2023)  Social Connections: Socially Isolated (11/03/2023)  Tobacco Use: Medium Risk (11/02/2023)   SDOH Interventions:     Readmission Risk Interventions    07/31/2023   11:26 AM 07/29/2023    2:54 PM 07/29/2023   10:12 AM  Readmission Risk Prevention Plan  Transportation Screening Complete Complete   PCP or Specialist Appt within 5-7 Days Complete    Home Care Screening Complete Complete Complete  Medication Review (RN CM) Complete Complete Complete

## 2023-11-04 NOTE — Evaluation (Signed)
 Occupational Therapy Evaluation Patient Details Name: Allison Norman MRN: [id number] DOB: 1924-11-20 Today's Date: 11/04/2023   History of Present Illness   Allison Norman is a 89 y.o. female with medical history significant of Afib s/p DCCV in 2017 with re-occurrence in 2018; now rate controlled, HTN, CKD3b, and breast/skin cancer, chronic resp failure on 2L [place], aortic stenosis presenting w/ fall, LLE wound.  Per report, patient was taking a shower when a shower line fell out of her hand swing with a folded on her left shin.  Has significant pain from this.  Patient denies any true falls associated with this episode.  No weakness or dizziness.  States that she developed intermediate large bruise over the affected area now with an open wound     Clinical Impressions Pt presents with decline in function and safety with ADLs and ADL mobility with impaired strength, balance and endurance. Pt limited by L E pain. PTA pt lives at home with 2 STE, has caregivers 24/7 that assist with home mgt. Pt reports she is independent with bathing,dressing, grooming and toiletiing;  could have assistance if needed, uses rollater for mobility, reports hx of falls. Pt currently requires mod A with LB ADLs, min A with toileting and min A with STS/mobility using RW. OT will follow acutely to maximize level of function and safety    If plan is discharge home, recommend the following:   A lot of help with bathing/dressing/bathroom;A little help with walking and/or transfers;Assistance with cooking/housework;Assist for transportation;Help with stairs or ramp for entrance     Functional Status Assessment   Patient has had a recent decline in their functional status and demonstrates the ability to make significant improvements in function in a reasonable and predictable amount of time.     Equipment Recommendations   None recommended by OT     Recommendations for Other Services          Precautions/Restrictions   Precautions Precautions: Fall;Other (comment) Precaution/Restrictions Comments: L shin dressing Restrictions Weight Bearing Restrictions Per Provider Order: No Other Position/Activity Restrictions: WBAT     Mobility Bed Mobility Overal bed mobility: Needs Assistance Bed Mobility: Supine to Sit     Supine to sit: Contact guard, HOB elevated, Used rails     General bed mobility comments: CGA for safety no physical assist    Transfers Overall transfer level: Needs assistance Equipment used: Rolling walker (2 wheels) Transfers: Sit to/from Stand, Bed to chair/wheelchair/BSC   Stand pivot transfers: Min assist, Contact guard assist   Step pivot transfers: Min assist, Contact guard assist            Balance Overall balance assessment: Needs assistance Sitting-balance support: No upper extremity supported, Feet supported Sitting balance-Leahy Scale: Good     Standing balance support: Bilateral upper extremity supported, During functional activity Standing balance-Leahy Scale: Poor                             ADL either performed or assessed with clinical judgement   ADL Overall ADL's : Needs assistance/impaired Eating/Feeding: Set up;Independent;Sitting   Grooming: Wash/dry hands;Wash/dry face;Contact guard assist;Standing   Upper Body Bathing: Set up   Lower Body Bathing: Moderate assistance   Upper Body Dressing : Set up   Lower Body Dressing: Moderate assistance   Toilet Transfer: Minimal assistance;Contact guard assist;Rolling walker (2 wheels);Ambulation;BSC/3in1;Cueing for safety   Toileting- Clothing Manipulation and Hygiene: Minimal assistance;Sit to/from stand  Functional mobility during ADLs: Minimal assistance;Contact guard assist;Rolling walker (2 wheels);Cueing for safety       Vision Baseline Vision/History: 1 Wears glasses Ability to See in Adequate Light: 0 Adequate Patient Visual Report:  No change from baseline       Perception         Praxis         Pertinent Vitals/Pain Pain Assessment Pain Assessment: Faces Faces Pain Scale: Hurts even more Pain Location: L LE wound, dressing in place Pain Descriptors / Indicators: Constant, Throbbing Pain Intervention(s): Limited activity within patient's tolerance, Monitored during session, Repositioned, RN gave pain meds during session     Extremity/Trunk Assessment Upper Extremity Assessment Upper Extremity Assessment: Generalized weakness;Right hand dominant   Lower Extremity Assessment Lower Extremity Assessment: Defer to PT evaluation       Communication Communication Communication: No apparent difficulties Factors Affecting Communication: Hearing impaired   Cognition Arousal: Alert Behavior During Therapy: WFL for tasks assessed/performed Cognition: No apparent impairments                               Following commands: Intact       Cueing  General Comments   Cueing Techniques: Verbal cues      Exercises     Shoulder Instructions      Home Living Family/patient expects to be discharged to:: Private residence Living Arrangements: Other (Comment) (24 hour care at home) Available Help at Discharge: Family;Personal care attendant;Available 24 hours/day Type of Home: House Home Access: Stairs to enter [employer] of Steps: 2   Home Layout: One level     Bathroom Shower/Tub: Producer, Television/film/video: Handicapped height     Home Equipment: [employer] Consultant (2 wheels);Rollator (4 wheels);Shower seat;BSC/3in1;Transport chair          Prior Functioning/Environment               Mobility Comments: Mod I with rollator ADLs Comments: Pt has caregivers that assist with home mgt. Pt reports she is independent with bathing,dressing, grooming and toiletiing;  could have assistance if needed    OT Problem List: Decreased strength;Decreased activity  tolerance;Impaired balance (sitting and/or standing);Pain   OT Treatment/Interventions: Self-care/ADL training;Therapeutic exercise;Patient/family education;Balance training;Therapeutic activities;DME and/or AE instruction      OT Goals(Current goals can be found in the care plan section)   Acute Rehab OT Goals Patient Stated Goal: go home OT Goal Formulation: With patient/family Time For Goal Achievement: 11/18/23 Potential to Achieve Goals: Good ADL Goals Pt Will Perform Grooming: with supervision;with set-up;standing Pt Will Perform Lower Body Bathing: with min assist;with caregiver independent in assisting Pt Will Perform Lower Body Dressing: with min assist;with caregiver independent in assisting Pt Will Transfer to Toilet: with contact guard assist;with supervision;ambulating Pt Will Perform Toileting - Clothing Manipulation and hygiene: with contact guard assist;with supervision;sitting/lateral leans;sit to/from stand   OT Frequency:  Min 2X/week    Co-evaluation PT/OT/SLP Co-Evaluation/Treatment: Yes Reason for Co-Treatment: For patient/therapist safety;To address functional/ADL transfers   OT goals addressed during session: ADL's and self-care;Proper use of Adaptive equipment and DME      AM-PAC OT 6 Clicks Daily Activity     Outcome Measure Help from another person eating meals?: None Help from another person taking care of personal grooming?: A Little Help from another person toileting, which includes using toliet, bedpan, or urinal?: A Little Help from another person bathing (including washing, rinsing, drying)?: A Lot  Help from another person to put on and taking off regular upper body clothing?: A Little Help from another person to put on and taking off regular lower body clothing?: A Lot 6 Click Score: 17   End of Session Equipment Utilized During Treatment: Gait belt;Rolling walker (2 wheels);Other (comment) [facility]) Nurse Communication: Mobility status;Patient  requests pain meds  Activity Tolerance: Patient limited by pain Patient left: in chair;with call bell/phone within reach;with family/visitor present  OT Visit Diagnosis: Other abnormalities of gait and mobility (R26.89);Unsteadiness on feet (R26.81);History of falling (Z91.81);Muscle weakness (generalized) (M62.81)                Time: 8581-8548 OT Time Calculation (min): 33 min Charges:  OT General Charges $OT Visit: 1 Visit OT Evaluation $OT Eval Moderate Complexity: 1 Mod    Jacques Karna Loose 11/04/2023, 3:09 PM

## 2023-11-04 NOTE — Evaluation (Signed)
 Physical Therapy Evaluation Patient Details Name: Allison Norman MRN: [id number] DOB: 1924-06-13 Today's Date: 11/04/2023  History of Present Illness  The pt is a 89 yo female presenting 10/27 with hematoma to L shin. Plan for I&D 10/31. PMH includes: afib on Eliquis , CHF, CKD, breast cancer, chronic resp failure on 2L, pacemaker.  Clinical Impression  Pt in bed upon arrival of PT, agreeable to evaluation at this time. Prior to admission the pt was mobilizing in her home with use of rollator, reports a few falls/year. The pt and her family also report she is largely independent with ADLs, but has 24/7 supervision arranged. The pt presents with deficits in LE strength, activity tolerance, mobility, and stability due to pain in LLE. Pt dependent on modA to maintain static standing balance, and was limited to a few steps at a time to complete transfer to and from recliner and BSC. The pt is hopeful to progress to home with improved pain management in LLE, will continue to follow acutely and progress as tolerated. Will plan to re-assess as needed after planned I&D of hematoma on 10/31.       If plan is discharge home, recommend the following: A lot of help with walking and/or transfers;A lot of help with bathing/dressing/bathroom;Assistance with cooking/housework;Assist for transportation;Help with stairs or ramp for entrance   Can travel by private vehicle        Equipment Recommendations None recommended by PT  Recommendations for Other Services       Functional Status Assessment Patient has had a recent decline in their functional status and demonstrates the ability to make significant improvements in function in a reasonable and predictable amount of time.     Precautions / Restrictions        Mobility  Bed Mobility Overal bed mobility: Needs Assistance Bed Mobility: Supine to Sit     Supine to sit: Contact guard, HOB elevated, Used rails     General bed mobility comments: CGA  for safety no physical assist    Transfers Overall transfer level: Needs assistance Equipment used: Rolling walker (2 wheels) Transfers: Sit to/from Stand, Bed to chair/wheelchair/BSC Sit to Stand: Mod assist Stand pivot transfers: Min assist, Contact guard assist Step pivot transfers: Min assist, Contact guard assist       General transfer comment: pt initially needing modA to rise and steady with use of RW. then able to step to [facility] with HHA and modA to steady    Ambulation/Gait Ambulation/Gait assistance: Min assist, +2 safety/equipment Gait Distance (Feet): 5 Feet Assistive device: Rolling walker (2 wheels), 2 person hand held assist Gait Pattern/deviations: Step-to pattern, Decreased stride length, Leaning posteriorly, Narrow base of support, Trunk flexed Gait velocity: decreased Gait velocity interpretation: <1.31 ft/sec, indicative of household ambulator   General Gait Details: pt with narrow steps and trunk flexed during lateral steps to and from [facility]. limited due to pain in LLE with stance     Balance Overall balance assessment: Needs assistance Sitting-balance support: No upper extremity supported, Feet supported Sitting balance-Leahy Scale: Good   Postural control: Posterior lean Standing balance support: Bilateral upper extremity supported, During functional activity Standing balance-Leahy Scale: Poor Standing balance comment: dependent on BUE support and modA                             Pertinent Vitals/Pain Pain Assessment Pain Assessment: Faces Pain Score: 6  Faces Pain Scale: Hurts even more Pain Location: L  LE wound, dressing in place Pain Descriptors / Indicators: Constant, Throbbing Pain Intervention(s): Limited activity within patient's tolerance, Monitored during session, Repositioned, RN gave pain meds during session    Home Living Family/patient expects to be discharged to:: Private residence Living Arrangements: Other (Comment) (24  hour care at home) Available Help at Discharge: Family;Personal care attendant;Available 24 hours/day Type of Home: House Home Access: Stairs to enter   [employer] of Steps: 2   Home Layout: One level Home Equipment: [employer]ant (2 wheels);Rollator (4 wheels);Shower seat;BSC/3in1;Transport chair      Prior Function Prior Level of Function : Needs assist;History of Falls (last six months)             Mobility Comments: Mod I with rollator, few falls/year ADLs Comments: Pt has caregivers that assist with home mgt. Pt reports she is independent with bathing,dressing, grooming and toiletiing;  could have assistance if needed     Extremity/Trunk Assessment   Upper Extremity Assessment Upper Extremity Assessment: Defer to OT evaluation    Lower Extremity Assessment Lower Extremity Assessment: Generalized weakness (limited assessment of LLE as pt declines moving ankle/toes due to pain, reports sensation intact. grossly 4-/5 to MMT in RLE)    Cervical / Trunk Assessment Cervical / Trunk Assessment: Kyphotic;Other exceptions Cervical / Trunk Exceptions: frail  Communication   Communication Communication: No apparent difficulties Factors Affecting Communication: Hearing impaired    Cognition Arousal: Alert Behavior During Therapy: WFL for tasks assessed/performed   PT - Cognitive impairments: No apparent impairments                       PT - Cognition Comments: family present and reports pt near baseline, pt able to answer all questions approrpiately, not formally assessed Following commands: Intact       Cueing Cueing Techniques: Verbal cues     General Comments General comments (skin integrity, edema, etc.): pt on 2L with SpO2 98%, tolerated RA with SpO2 95%. BP stable    Exercises     Assessment/Plan    PT Assessment Patient needs continued PT services  PT Problem List Decreased strength;Decreased range of motion;Decreased activity  tolerance;Decreased balance;Decreased mobility;Pain       PT Treatment Interventions DME instruction;Gait training;Stair training;Functional mobility training;Therapeutic activities;Therapeutic exercise;Balance training;Patient/family education    PT Goals (Current goals can be found in the Care Plan section)  Acute Rehab PT Goals Patient Stated Goal: to return home PT Goal Formulation: With patient Time For Goal Achievement: 11/18/23 Potential to Achieve Goals: Good    Frequency Min 2X/week     Co-evaluation PT/OT/SLP Co-Evaluation/Treatment: Yes Reason for Co-Treatment: For patient/therapist safety;To address functional/ADL transfers PT goals addressed during session: Mobility/safety with mobility;Balance;Proper use of DME;Strengthening/ROM         AM-PAC PT 6 Clicks Mobility  Outcome Measure Help needed turning from your back to your side while in a flat bed without using bedrails?: A Little Help needed moving from lying on your back to sitting on the side of a flat bed without using bedrails?: A Little Help needed moving to and from a bed to a chair (including a wheelchair)?: A Lot Help needed standing up from a chair using your arms (e.g., wheelchair or bedside chair)?: A Lot Help needed to walk in hospital room?: Total (<20 ft) Help needed climbing 3-5 steps with a railing? : Total 6 Click Score: 12    End of Session Equipment Utilized During Treatment: Oxygen  Activity Tolerance: Patient tolerated treatment  well;Patient limited by pain Patient left: in chair;with call bell/phone within reach;with family/visitor present Nurse Communication: Mobility status;Patient requests pain meds PT Visit Diagnosis: Unsteadiness on feet (R26.81);Other abnormalities of gait and mobility (R26.89);Muscle weakness (generalized) (M62.81);History of falling (Z91.81);Pain Pain - Right/Left: Left Pain - part of body: Leg    Time: 8581-8548 PT Time Calculation (min) (ACUTE ONLY): 33  min   Charges:   PT Evaluation $PT Eval Moderate Complexity: 1 Mod   PT General Charges $$ ACUTE PT VISIT: 1 Visit         Izetta Call, PT, DPT   Acute Rehabilitation Department Office [phone] Secure Chat Communication Preferred  Izetta JULIANNA Call 11/04/2023, 4:10 PM

## 2023-11-04 NOTE — Progress Notes (Signed)
 SLP Cancellation Note  Patient Details Name: AZALYNN MAXIM MRN: [id number] DOB: [birth year]   Cancelled evaluation: Screened pt, family present and report that cognition/language are WNL. No SLP eval warranted. Our service will sign off.  Kayla Weekes L. Vona, MA CCC/SLP Clinical Specialist - Acute Care SLP Acute Rehabilitation Services Office number [phone]           Vona Palma Laurice 11/04/2023, 2:47 PM

## 2023-11-05 DIAGNOSIS — S8012XA Contusion of left lower leg, initial encounter: Secondary | ICD-10-CM | POA: Diagnosis not present

## 2023-11-05 LAB — BASIC METABOLIC PANEL WITH GFR
Anion gap: 10 (ref 5–15)
BUN: 44 mg/dL — ABNORMAL HIGH (ref 8–23)
CO2: 26 mmol/L (ref 22–32)
Calcium: 8.3 mg/dL — ABNORMAL LOW (ref 8.9–10.3)
Chloride: 103 mmol/L (ref 98–111)
Creatinine, Ser: 2.39 mg/dL — ABNORMAL HIGH (ref 0.44–1.00)
GFR, Estimated: 18 mL/min — ABNORMAL LOW (ref >60)
Glucose, Bld: 97 mg/dL (ref 70–99)
Potassium: 4.8 mmol/L (ref 3.5–5.1)
Sodium: 139 mmol/L (ref 135–145)

## 2023-11-05 LAB — CBC
HCT: 33.7 % — ABNORMAL LOW (ref 36.0–46.0)
Hemoglobin: 10.6 g/dL — ABNORMAL LOW (ref 12.0–15.0)
MCH: 29.9 pg (ref 26.0–34.0)
MCHC: 31.5 g/dL (ref 30.0–36.0)
MCV: 95.2 fL (ref 80.0–100.0)
Platelets: 196 K/uL (ref 150–400)
RBC: 3.54 MIL/uL — ABNORMAL LOW (ref 3.87–5.11)
RDW: 20.4 % — ABNORMAL HIGH (ref 11.5–15.5)
WBC: 6.8 K/uL (ref 4.0–10.5)
nRBC: 0 % (ref 0.0–0.2)

## 2023-11-05 MED ORDER — LACTATED RINGERS IV SOLN: INTRAVENOUS | Status: AC

## 2023-11-05 NOTE — Plan of Care (Signed)
   Problem: Education: Goal: Knowledge of General Education information will improve Description: Including pain rating scale, medication(s)/side effects and non-pharmacologic comfort measures Outcome: Progressing   Problem: Health Behavior/Discharge Planning: Goal: Ability to manage health-related needs will improve Outcome: Progressing   Problem: Clinical Measurements: Goal: Will remain free from infection Outcome: Progressing

## 2023-11-05 NOTE — Progress Notes (Signed)
 Mobility Specialist Progress Note;   11/05/23 1245  Mobility  Activity Pivoted/transferred from bed to chair;Pivoted/transferred to/from [facility]  Level of Assistance Minimal assist, patient does 75% or more  Assistive Device Other (Comment) (HHA)  Distance Ambulated (ft) 7 ft  Activity Response Tolerated well  Mobility Referral Yes  Mobility visit 1 Mobility  Mobility Specialist Start Time (ACUTE ONLY) 1245  Mobility Specialist Stop Time (ACUTE ONLY) 1255  Mobility Specialist Time Calculation (min) (ACUTE ONLY) 10 min   Pt agreeable to mobility. Requested assistance to [facility], void successful. Transferred pt to chair once finished on BSC. Required MinA+2 for all safe mobility. Pt premedicated for LLE pain, no c/o during session. Pt left in chair with all needs met, alarm on. RN notified.   Lauraine Erm Mobility Specialist Please contact via SecureChat or [employer] [phone]

## 2023-11-05 NOTE — Progress Notes (Signed)
 PROGRESS NOTE Allison Norman  FMW:[id number] DOB: 12/12/1924 DOA: 11/02/2023 PCP: Okey Carlin Redbird, MD  Brief Narrative/Hospital Course: Allison Norman is a 89 y.o. female with PMH of Afib s/p DCCV in 2017 with re-occurrence in 2018, HTN, CKD3b, and breast/skin cancer, chronic resp failure on 2L [place], aortic stenosis presenting w/ pain and wound on LLE after she fell when a shower line fell out of her hand swinga and folded on her left shin. She developed intermediate large bruise over the affected area. Patient noted to have been admitted July 22 through July 30 for issues including acute on chronic heart failure to which she is placed in the hospital home program and otherwise stable course.Has a around-the-clock caregiver support at home.   in ER: afebrile, hemodynamically stable.  Satting 88% on room air.  Transitioned to 2 L nasal cannula to keep O2 sats.  96%.  White count 6.6, hemoglobin 13.1, platelets 236.  Creatinine 1.9.  Left tib-fib showing no acute fracture and soft tissue density measuring 10, anterior extremity concerning for hematoma.  XRAY TIBIA/Fibula: No acute fracture. Soft tissue density measuring up to 10 cm in the anterior lower extremity, likely related to hematoma.   Subjective: Seen and examined Granddaughter at the bedside along with caregiver. No new complaints resting comfortably some mild bleeding from the right knee area and has a dressing in place now Overnight hemodynamically stable afebrile Labs with creatinine trending up 2.3-no obvious hypertension hemoglobin drifting 10.6 g  Assessment and plan:  Posttraumatic pretibial hematoma of left lower extremity, initial encounter LLE hematoma s/p traumatic strike with shower wand:  Left lower extremity x-ray stable, soft tissue hematoma noted dressing intact  Discussed with [place] purchase Dr. Harden has consulted planning for debridement hematoma evacuation Continue pain management and trend hb  ABLA and  chronic anemia: Hemoglobin downtrending in the setting of leg hematoma as above.  Monitor and transfuse if less than 7 g Recent Labs  Lab 11/02/23 1609 11/03/23 1547 11/05/23 0233  HGB 13.1 12.2 10.6*  HCT 41.6 38.8 33.7*     AKI on CKD  stage IV: baseline scr 1.7-2.0, creatinine trending up, will start IV fluid hydration, avoid hypotension nephrotoxic medication and dehydration and hold Lasix . This could be in the setting of patient's hematoma ABLA, volume depletion and diuretics. Recent Labs    07/31/23 1022 08/01/23 1258 08/02/23 1114 08/03/23 1013 08/04/23 1018 08/10/23 1313 11/02/23 1609 11/03/23 1547 11/04/23 0406 11/05/23 0233  BUN 36* 35* 36* 33* 32* 32 44* 40* 39* 44*  CREATININE 1.94* 1.76* 1.82* 1.73* 1.75* 1.60* 1.90* 1.71* 1.78* 2.39*  CO2 32 32 30 30 32 27 27 26 24  26  K 4.3 4.2 4.2 4.3 3.9 4.8 4.6 5.0 4.7 4.8     PAF: Baseline atrial fibrillation on eliquis  and amiodarone  . Holding eliquis  in setting of large LLE traumatic hematoma    Chronic respiratory failure with hypoxia: On 2L [place] chronically , at baseline Monitor    Chronic diastolic CHF 2D ECHO 03/2022 w/ EF 65-70% and grade 2 diastolic dysfunction.  Holding Lasix  due to AKI  Severe malnutrition w/ Body mass index is 17.21 kg/m.: RD consulted  Mobility: PT Orders: Active  PT Follow up Rec: Home Health Pt10/29/2025 1508   DVT prophylaxis:  Code Status:   Code Status: Limited: Do not attempt resuscitation (DNR) -DNR-LIMITED -Do Not Intubate/DNI  Family Communication: plan of care discussed with patient/caregiver at bedside. Patient status is: Remains hospitalized because of severity of illness Level  of care: Telemetry   Dispo: The patient is from: home            Anticipated disposition: TBD Objective: Vitals last 24 hrs: Vitals:   11/04/23 2318 11/05/23 0420 11/05/23 0810 11/05/23 0909  BP: 105/64 121/65 128/66 (!) 104/53  Pulse: 60 60 60 60  Resp: 18 20    Temp: 98.3 F (36.8 C) 98.3  F (36.8 C) 97.9 F (36.6 C)   TempSrc: Oral Oral Oral   SpO2: 96% 96%    Weight:      Height:        Physical Examination: General exam: alert awake, oriented HEENT:Oral mucosa moist, Ear/Nose WNL grossly Respiratory system: Bilaterally clear BS,no use of accessory muscle Cardiovascular system: S1 & S2 +, No JVD. Gastrointestinal system: Abdomen soft,NT,ND, BS+ Nervous System: Alert, awake, moving all extremities,and following commands. Extremities: extremities warm, leg edema neg-left shin with area of hematoma, dressing intact mild bleeding Skin: Warm, no rashes MSK: Normal muscle bulk,tone, power   Medications reviewed:  Scheduled Meds:  acetaminophen   650 mg Oral Once   amiodarone   100 mg Oral Daily   Chlorhexidine  Gluconate Cloth  6 each Topical Daily   metoprolol  tartrate  12.5 mg Oral BID   multivitamin with minerals  1 tablet Oral Daily   mupirocin ointment  1 Application Nasal BID   pantoprazole   40 mg Oral Daily   Continuous Infusions:  lactated ringers 100 mL/hr at 11/05/23 0907   Diet: Diet Order             Diet NPO time specified  Diet effective midnight           Diet regular Room service appropriate? Yes with Assist; Fluid consistency: Thin  Diet effective now                    Data Reviewed: I have personally reviewed following labs and imaging studies ( see epic result tab) CBC: Recent Labs  Lab 11/02/23 1609 11/03/23 1547 11/05/23 0233  WBC 6.6  --  6.8  NEUTROABS 3.7  --   --   HGB 13.1 12.2 10.6*  HCT 41.6 38.8 33.7*  MCV 93.5  --  95.2  PLT 236  --  196   CMP: Recent Labs  Lab 11/02/23 1609 11/03/23 1547 11/04/23 0406 11/05/23 0233  NA 138 140 137 139  K 4.6 5.0 4.7 4.8  CL 99 105 103 103  CO2 27 26 24 26   GLUCOSE 97 110* 99 97  BUN 44* 40* 39* 44*  CREATININE 1.90* 1.71* 1.78* 2.39*  CALCIUM  9.4 8.5* 8.7* 8.3*   GFR: Estimated Creatinine Clearance: 10.1 mL/min (A) (by C-G formula based on SCr of 2.39 mg/dL  (H)). Recent Labs  Lab 11/03/23 1547 11/04/23 0406  AST 21 20  ALT 15 13  ALKPHOS 95 85  BILITOT 1.0 0.8  PROT 6.8 6.1*  ALBUMIN  3.1* 2.7*   No results for input(s): LIPASE, AMYLASE in the last 168 hours. No results for input(s): AMMONIA in the last 168 hours. Coagulation Profile:  Recent Labs  Lab 11/02/23 1609  INR 1.2   Unresulted Labs (From admission, onward)     Start     Ordered   11/05/23 0500  Basic metabolic panel with GFR  Daily,   R      11/04/23 0642   11/05/23 0500  CBC  Daily,   R      11/04/23 9357  Antimicrobials/Microbiology: Anti-infectives (From admission, onward)    None         Component Value Date/Time   SDES BLOOD RIGHT ARM 03/09/2023 2106   SPECREQUEST  03/09/2023 2106    BOTTLES DRAWN AEROBIC ONLY Blood Culture results may not be optimal due to an inadequate volume of blood received in culture bottles   CULT  03/09/2023 2106    NO GROWTH 5 DAYS Performed at [facility], [address] [place], [place] [address]    REPTSTATUS 03/14/2023 FINAL 03/09/2023 2106    Procedures: Procedure(s) (LRB): INCISION AND DRAINAGE OF DEEP ABSCESS, CALF (Left) APPLICATION, WOUND VAC (Left)   Mennie LAMY, MD [facility] 11/05/2023, 12:58 PM

## 2023-11-06 ENCOUNTER — Encounter (HOSPITAL_COMMUNITY)
Admission: EM | Disposition: A | Discharge status: Home Health | Payer: Self-pay | Source: Home / Self Care | Attending: Internal Medicine

## 2023-11-06 ENCOUNTER — Inpatient Hospital Stay (HOSPITAL_COMMUNITY): Payer: Self-pay

## 2023-11-06 ENCOUNTER — Encounter (HOSPITAL_COMMUNITY): Payer: Self-pay

## 2023-11-06 DIAGNOSIS — I5032 Chronic diastolic (congestive) heart failure: Secondary | ICD-10-CM

## 2023-11-06 DIAGNOSIS — N184 Chronic kidney disease, stage 4 (severe): Secondary | ICD-10-CM | POA: Diagnosis not present

## 2023-11-06 DIAGNOSIS — S8012XA Contusion of left lower leg, initial encounter: Secondary | ICD-10-CM

## 2023-11-06 DIAGNOSIS — I130 Hypertensive heart and chronic kidney disease with heart failure and stage 1 through stage 4 chronic kidney disease, or unspecified chronic kidney disease: Secondary | ICD-10-CM

## 2023-11-06 HISTORY — PX: APPLICATION OF WOUND VAC: SHX5189

## 2023-11-06 HISTORY — PX: INCISION AND DRAINAGE OF DEEP ABSCESS, CALF: SHX7361

## 2023-11-06 LAB — BASIC METABOLIC PANEL WITH GFR
Anion gap: 10 (ref 5–15)
BUN: 46 mg/dL — ABNORMAL HIGH (ref 8–23)
CO2: 23 mmol/L (ref 22–32)
Calcium: 7.8 mg/dL — ABNORMAL LOW (ref 8.9–10.3)
Chloride: 103 mmol/L (ref 98–111)
Creatinine, Ser: 2.05 mg/dL — ABNORMAL HIGH (ref 0.44–1.00)
GFR, Estimated: 22 mL/min — ABNORMAL LOW (ref >60)
Glucose, Bld: 105 mg/dL — ABNORMAL HIGH (ref 70–99)
Potassium: 4.5 mmol/L (ref 3.5–5.1)
Sodium: 136 mmol/L (ref 135–145)

## 2023-11-06 LAB — CBC
HCT: 31.9 % — ABNORMAL LOW (ref 36.0–46.0)
Hemoglobin: 9.9 g/dL — ABNORMAL LOW (ref 12.0–15.0)
MCH: 30.1 pg (ref 26.0–34.0)
MCHC: 31 g/dL (ref 30.0–36.0)
MCV: 97 fL (ref 80.0–100.0)
Platelets: 185 K/uL (ref 150–400)
RBC: 3.29 MIL/uL — ABNORMAL LOW (ref 3.87–5.11)
RDW: 19.9 % — ABNORMAL HIGH (ref 11.5–15.5)
WBC: 6.3 K/uL (ref 4.0–10.5)
nRBC: 0 % (ref 0.0–0.2)

## 2023-11-06 SURGERY — INCISION AND DRAINAGE OF DEEP ABSCESS, CALF
Anesthesia: General | Laterality: Left

## 2023-11-06 MED ORDER — VASHE WOUND IRRIGATION OPTIME
TOPICAL | Status: DC | PRN
Start: 2023-11-06 — End: 2023-11-06
  Administered 2023-11-06: 34 [oz_av]

## 2023-11-06 MED ORDER — SODIUM CHLORIDE 0.9 % IV SOLN: INTRAVENOUS | Status: DC | PRN

## 2023-11-06 MED ORDER — LACTATED RINGERS IV SOLN: INTRAVENOUS | Status: AC

## 2023-11-06 MED ORDER — ONDANSETRON HCL 4 MG/2ML IJ SOLN
INTRAMUSCULAR | Status: AC
Start: 2023-11-06 — End: 2023-11-06
  Filled 2023-11-06: qty 2

## 2023-11-06 MED ORDER — DEXAMETHASONE SOD PHOSPHATE PF 10 MG/ML IJ SOLN
INTRAMUSCULAR | Status: DC | PRN
  Administered 2023-11-06: 4 mg via INTRAVENOUS

## 2023-11-06 MED ORDER — HYDROMORPHONE HCL 1 MG/ML IJ SOLN
0.2500 mg | INTRAMUSCULAR | Status: DC | PRN
  Administered 2023-11-06 (×2): 0.5 mg via INTRAVENOUS

## 2023-11-06 MED ORDER — FENTANYL CITRATE (PF) 100 MCG/2ML IJ SOLN
INTRAMUSCULAR | Status: AC
  Filled 2023-11-06: qty 2

## 2023-11-06 MED ORDER — CHLORHEXIDINE GLUCONATE 4 % EX SOLN: 60.0000 mL | Freq: Once | CUTANEOUS | Status: DC

## 2023-11-06 MED ORDER — FENTANYL CITRATE (PF) 100 MCG/2ML IJ SOLN
25.0000 ug | INTRAMUSCULAR | Status: DC | PRN
  Administered 2023-11-06 (×2): 25 ug via INTRAVENOUS
  Administered 2023-11-06 (×2): 50 ug via INTRAVENOUS

## 2023-11-06 MED ORDER — PHENYLEPHRINE 80 MCG/ML (10ML) SYRINGE FOR IV PUSH (FOR BLOOD PRESSURE SUPPORT)
PREFILLED_SYRINGE | INTRAVENOUS | Status: DC | PRN
  Administered 2023-11-06 (×2): 80 ug via INTRAVENOUS
  Administered 2023-11-06: 120 ug via INTRAVENOUS

## 2023-11-06 MED ORDER — CEFAZOLIN SODIUM-DEXTROSE 2-4 GM/100ML-% IV SOLN
2.0000 g | INTRAVENOUS | Status: AC
  Administered 2023-11-06: 2 g via INTRAVENOUS
  Filled 2023-11-06: qty 100

## 2023-11-06 MED ORDER — CHLORHEXIDINE GLUCONATE 0.12 % MT SOLN
OROMUCOSAL | Status: AC
  Administered 2023-11-06: 15 mL via OROMUCOSAL
  Filled 2023-11-06: qty 15

## 2023-11-06 MED ORDER — 0.9 % SODIUM CHLORIDE (POUR BTL) OPTIME
TOPICAL | Status: DC | PRN
  Administered 2023-11-06: 1000 mL

## 2023-11-06 MED ORDER — HYDROMORPHONE HCL 1 MG/ML IJ SOLN
INTRAMUSCULAR | Status: AC
  Filled 2023-11-06: qty 1

## 2023-11-06 MED ORDER — ONDANSETRON HCL 4 MG/2ML IJ SOLN: 4.0000 mg | Freq: Once | INTRAMUSCULAR | Status: DC | PRN

## 2023-11-06 MED ORDER — ONDANSETRON HCL 4 MG/2ML IJ SOLN
INTRAMUSCULAR | Status: DC | PRN
  Administered 2023-11-06: 4 mg via INTRAVENOUS

## 2023-11-06 MED ORDER — ORAL CARE MOUTH RINSE: 15.0000 mL | Freq: Once | OROMUCOSAL | Status: AC

## 2023-11-06 MED ORDER — OXYCODONE HCL 5 MG PO TABS: 5.0000 mg | ORAL_TABLET | Freq: Once | ORAL | Status: DC | PRN

## 2023-11-06 MED ORDER — PHENYLEPHRINE 80 MCG/ML (10ML) SYRINGE FOR IV PUSH (FOR BLOOD PRESSURE SUPPORT)
PREFILLED_SYRINGE | INTRAVENOUS | Status: AC
  Filled 2023-11-06: qty 10

## 2023-11-06 MED ORDER — POVIDONE-IODINE 10 % EX SWAB: 2.0000 | Freq: Once | CUTANEOUS | Status: DC

## 2023-11-06 MED ORDER — FENTANYL CITRATE (PF) 250 MCG/5ML IJ SOLN
INTRAMUSCULAR | Status: AC
  Filled 2023-11-06: qty 5

## 2023-11-06 MED ORDER — FENTANYL CITRATE (PF) 250 MCG/5ML IJ SOLN
INTRAMUSCULAR | Status: DC | PRN
  Administered 2023-11-06: 25 ug via INTRAVENOUS

## 2023-11-06 MED ORDER — PROPOFOL 10 MG/ML IV BOLUS
INTRAVENOUS | Status: AC
  Filled 2023-11-06: qty 20

## 2023-11-06 MED ORDER — OXYCODONE HCL 5 MG/5ML PO SOLN: 5.0000 mg | Freq: Once | ORAL | Status: DC | PRN

## 2023-11-06 MED ORDER — VANCOMYCIN HCL 750 MG/150ML IV SOLN
750.0000 mg | Freq: Once | INTRAVENOUS | Status: AC
  Administered 2023-11-06: 750 mg via INTRAVENOUS
  Filled 2023-11-06: qty 150

## 2023-11-06 MED ORDER — LIDOCAINE 2% (20 MG/ML) 5 ML SYRINGE
INTRAMUSCULAR | Status: AC
  Filled 2023-11-06: qty 5

## 2023-11-06 MED ORDER — LACTATED RINGERS IV SOLN: INTRAVENOUS | Status: DC

## 2023-11-06 MED ORDER — LIDOCAINE 2% (20 MG/ML) 5 ML SYRINGE
INTRAMUSCULAR | Status: DC | PRN
  Administered 2023-11-06: 100 mg via INTRAVENOUS

## 2023-11-06 MED ORDER — CHLORHEXIDINE GLUCONATE 0.12 % MT SOLN: 15.0000 mL | Freq: Once | OROMUCOSAL | Status: AC

## 2023-11-06 MED ORDER — VANCOMYCIN HCL IN DEXTROSE 1-5 GM/200ML-% IV SOLN
1000.0000 mg | INTRAVENOUS | Status: DC
  Filled 2023-11-06: qty 200

## 2023-11-06 MED ORDER — PROPOFOL 10 MG/ML IV BOLUS
INTRAVENOUS | Status: DC | PRN
  Administered 2023-11-06: 50 mg via INTRAVENOUS
  Administered 2023-11-06: 30 mg via INTRAVENOUS

## 2023-11-06 SURGICAL SUPPLY — 40 items
BAG COUNTER SPONGE SURGICOUNT (BAG) IMPLANT
BLADE SURG 21 STRL SS (BLADE) ×1 IMPLANT
BNDG COHESIVE 4X5 TAN STRL LF (GAUZE/BANDAGES/DRESSINGS) IMPLANT
BNDG COHESIVE 6X5 TAN NS LF (GAUZE/BANDAGES/DRESSINGS) IMPLANT
BNDG COHESIVE 6X5 TAN ST LF (GAUZE/BANDAGES/DRESSINGS) IMPLANT
BNDG GAUZE DERMACEA FLUFF 4 (GAUZE/BANDAGES/DRESSINGS) IMPLANT
CANISTER WOUNDNEG PRESSURE 500 (CANNISTER) IMPLANT
CLEANSER WND VASHE 34 (WOUND CARE) IMPLANT
CLEANSER WND VASHE INSTL 34OZ (WOUND CARE) IMPLANT
COVER SURGICAL LIGHT HANDLE (MISCELLANEOUS) ×2 IMPLANT
DRAPE DERMATAC (DRAPES) IMPLANT
DRAPE INCISE IOBAN 66X45 STRL (DRAPES) IMPLANT
DRAPE U-SHAPE 47X51 STRL (DRAPES) ×1 IMPLANT
DRESSING PREVENA PLUS CUSTOM (GAUZE/BANDAGES/DRESSINGS) IMPLANT
DRESSING VERAFLO CLEANS CC MED (GAUZE/BANDAGES/DRESSINGS) IMPLANT
DRSG ADAPTIC 3X8 NADH LF (GAUZE/BANDAGES/DRESSINGS) ×1 IMPLANT
DRSG VAC PEEL AND PLACE LRG (GAUZE/BANDAGES/DRESSINGS) IMPLANT
DURAPREP 26ML APPLICATOR (WOUND CARE) ×1 IMPLANT
ELECTRODE REM PT RTRN 9FT ADLT (ELECTROSURGICAL) IMPLANT
GAUZE PAD ABD 8X10 STRL (GAUZE/BANDAGES/DRESSINGS) IMPLANT
GAUZE SPONGE 4X4 12PLY STRL (GAUZE/BANDAGES/DRESSINGS) IMPLANT
GLOVE BIOGEL PI IND STRL 9 (GLOVE) ×1 IMPLANT
GLOVE SURG ORTHO 9.0 STRL STRW (GLOVE) ×1 IMPLANT
GOWN STRL REUS W/ TWL XL LVL3 (GOWN DISPOSABLE) ×2 IMPLANT
GRAFT SKIN WND SURGICLOSE M95 (Tissue) IMPLANT
KIT BASIN OR (CUSTOM PROCEDURE TRAY) ×1 IMPLANT
KIT TURNOVER KIT B (KITS) ×1 IMPLANT
MANIFOLD NEPTUNE II (INSTRUMENTS) ×1 IMPLANT
PACK ORTHO EXTREMITY (CUSTOM PROCEDURE TRAY) ×1 IMPLANT
PAD ARMBOARD POSITIONER FOAM (MISCELLANEOUS) ×2 IMPLANT
PAD NEG PRESSURE SENSATRAC (MISCELLANEOUS) IMPLANT
SET HNDPC FAN SPRY TIP SCT (DISPOSABLE) IMPLANT
SOLN 0.9% NACL POUR BTL 1000ML (IV SOLUTION) ×1 IMPLANT
STOCKINETTE IMPERVIOUS 9X36 MD (GAUZE/BANDAGES/DRESSINGS) IMPLANT
SUT ETHILON 2 0 PSLX (SUTURE) ×1 IMPLANT
SWAB COLLECTION DEVICE MRSA (MISCELLANEOUS) ×1 IMPLANT
SWAB CULTURE ESWAB REG 1ML (MISCELLANEOUS) IMPLANT
TOWEL GREEN STERILE (TOWEL DISPOSABLE) ×1 IMPLANT
TUBE CONNECTING 12X1/4 (SUCTIONS) ×1 IMPLANT
YANKAUER SUCT BULB TIP NO VENT (SUCTIONS) ×1 IMPLANT

## 2023-11-06 NOTE — Op Note (Signed)
 11/06/2023  12:07 PM  PATIENT:  Allison Norman    PRE-OPERATIVE DIAGNOSIS:  Hematoma Left Leg  POST-OPERATIVE DIAGNOSIS:  Same  PROCEDURE: Excisional debridement hematoma left leg. Application Kerecis micro graft 95 cm to cover wound surface area greater than 100 cm. Application of cleanse choice wound VAC sponge and peel in place wound VAC sponge.  SURGEON:  Jerona LULLA Sage, MD  PHYSICIAN ASSISTANT:None ANESTHESIA:   General  PREOPERATIVE INDICATIONS:  Allison Norman is a  89 y.o. female with a diagnosis of Hematoma Left Leg who failed conservative measures and elected for surgical management.    The risks benefits and alternatives were discussed with the patient preoperatively including but not limited to the risks of infection, bleeding, nerve injury, cardiopulmonary complications, the need for revision surgery, among others, and the patient was willing to proceed.  OPERATIVE IMPLANTS:   Implant Name Type Inv. Item Serial No. Manufacturer Lot No. LRB No. Used Action  GRAFT SKIN WND SURGICLOSE M95 - [id number] Tissue GRAFT SKIN WND SURGICLOSE M95  [employer] [id number] Left 1 Implanted    @ENCIMAGES @  OPERATIVE FINDINGS: After debridement patient had healthy viable tissue margins.  Muscle was intact.  OPERATIVE PROCEDURE: Patient was brought the operating room and underwent a general anesthetic.  After adequate levels anesthesia were obtained patient's left lower extremity was prepped using DuraPrep draped into a sterile field a timeout was called.  A 21 blade knife was used to excise skin soft tissue fascia and hematoma from the left leg.  After debridement the wound was 15 cm x 10 cm.  Electrocautery was used hemostasis.  The wound was irrigated with Vashe.  The wound was then covered with 95 cm of Kerecis micro graft to cover wound surface area of 150 cm.  This was then covered with cleanse choice wound VAC sponge in the peel in place sponge.  This had a good suction fit  patient was taken the PACU in stable condition.   DISCHARGE PLANNING:  Antibiotic duration: Continue antibiotics for 24 hours  Weightbearing: Weightbearing as tolerated on the left  Pain medication: Opioid pathway  Dressing care/ Wound VAC: Wound VAC for 1 week  Ambulatory devices: Walker as needed  Discharge to: Anticipate discharge back to home when safe with therapy.  Follow-up: In the office 1 week post operative.

## 2023-11-06 NOTE — Progress Notes (Signed)
 PROGRESS NOTE JULIETT EASTBURN  FMW:[id number] DOB: 1924/10/01 DOA: 11/02/2023 PCP: Okey Carlin Redbird, MD  Brief Narrative/Hospital Course: CHAUNTAY PASZKIEWICZ is a 89 y.o. female with PMH of Afib s/p DCCV in 2017 with re-occurrence in 2018, HTN, CKD3b, and breast/skin cancer, chronic resp failure on 2L [place], aortic stenosis presenting w/ pain and wound on LLE after she fell when a shower line fell out of her hand swinga and folded on her left shin. She developed intermediate large bruise over the affected area. Patient noted to have been admitted July 22 through July 30 for issues including acute on chronic heart failure to which she is placed in the hospital home program and otherwise stable course.Has a around-the-clock caregiver support at home.  in ER: afebrile, hemodynamically stable.  Satting 88% on room air.  Transitioned to 2 L nasal cannula to keep O2 sats.  96%.  White count 6.6, hemoglobin 13.1, platelets 236.  Creatinine 1.9.  Left tib-fib showing no acute fracture and soft tissue density measuring 10, anterior extremity concerning for hematoma. XRAY TIBIA/Fibula: No acute fracture. Soft tissue density measuring up to 10 cm in the anterior lower extremity likely related to hematoma. Dr. Harden has been consulted  Subjective: Seen and examined Alert and oriented resting comfortably.  Caregiver at the bedside Left leg pain stable Overnight afebrile BP stable, labs this morning shows creatinine improving 2.0 hemoglobin 9.9 slightly downtrending  Assessment and plan:  Posttraumatic pretibial hematoma of left lower extremity, initial encounter LLE hematoma s/p traumatic strike with shower wand:  Left lower extremity x-ray stable, soft tissue hematoma noted dressing intact . Dr. Harden planning for debridement hematoma evacuation 10/31 Pain remains controlled continue multimodal pain management monitor hemoglobin  ABLA and chronic anemia: Baseline hemoglobin around 10 g.  Slightly downtrending  in the setting of ABLA due to #1.  Monitor transfuse if less than 7 g Recent Labs  Lab 11/02/23 1609 11/03/23 1547 11/05/23 0233 11/06/23 0422  HGB 13.1 12.2 10.6* 9.9*  HCT 41.6 38.8 33.7* 31.9*     AKI on CKD  stage IV: baseline scr 1.7-2.0, + high fluid hydration Lasix  held 10/30, creatinine slightly improving, continue IV fluids, void hypotension nephrotoxic medication and dehydration and hold Lasix . This could be in the setting of patient's hematoma ABLA, volume depletion and diuretics. Recent Labs    08/01/23 1258 08/02/23 1114 08/03/23 1013 08/04/23 1018 08/10/23 1313 11/02/23 1609 11/03/23 1547 11/04/23 0406 11/05/23 0233 11/06/23 0422  BUN 35* 36* 33* 32* 32 44* 40* 39* 44* 46*  CREATININE 1.76* 1.82* 1.73* 1.75* 1.60* 1.90* 1.71* 1.78* 2.39* 2.05*  CO2 32 30 30 32 27 27 26 24 26  23  K 4.2 4.2 4.3 3.9 4.8 4.6 5.0 4.7 4.8 4.5     PAF: Baseline atrial fibrillation on eliquis  and amiodarone  . Holding eliquis  in setting of large LLE traumatic hematoma    Chronic respiratory failure with hypoxia: On 2L [place] chronically , at baseline Monitor    Chronic diastolic CHF 2D ECHO 03/2022 w/ EF 65-70% and grade 2 diastolic dysfunction.  Holding Lasix  due to AKI  Severe malnutrition w/ Body mass index is 17.21 kg/m.: RD consulted  Mobility: PT Orders: Active  PT Follow up Rec: Home Health Pt10/29/2025 1508   DVT prophylaxis:  Code Status:   Code Status: Limited: Do not attempt resuscitation (DNR) -DNR-LIMITED -Do Not Intubate/DNI  Family Communication: plan of care discussed with patient/caregiver at bedside. Patient status is: Remains hospitalized because of severity of illness Level  of care: Telemetry   Dispo: The patient is from: home            Anticipated disposition: TBD Objective: Vitals last 24 hrs: Vitals:   11/06/23 0420 11/06/23 0728 11/06/23 0824 11/06/23 1006  BP: (!) 120/52 116/74 125/62 122/77  Pulse: (!) 59 (!) 116 60   Resp: 17 19 18  (!) 21   Temp: 98.4 F (36.9 C) 98 F (36.7 C) 98.2 F (36.8 C) 98.4 F (36.9 C)  TempSrc: Oral Oral Oral Oral  SpO2: 92% 98% 97%   Weight:      Height:       Physical Examination: General exam: alert awake, oriented, thin, on RA HEENT:Oral mucosa moist, Ear/Nose WNL grossly Respiratory system: Bilaterally clear BS,no use of accessory muscle Cardiovascular system: S1 & S2 +, No JVD. Gastrointestinal system: Abdomen soft,NT,ND, BS+ Nervous System: Alert, awake non focal Extremities: extremities warm, LLE area of hematoma w/ swelling/ dressing c/d/I Skin: Warm, no rashes MSK: Normal muscle bulk,tone, power   Medications reviewed:  Scheduled Meds:  [MAR Hold] acetaminophen   650 mg Oral Once   [MAR Hold] amiodarone   100 mg Oral Daily   chlorhexidine   60 mL Topical Once   chlorhexidine   15 mL Mouth/Throat Once   Or   mouth rinse  15 mL Mouth Rinse Once   chlorhexidine        [MAR Hold] Chlorhexidine  Gluconate Cloth  6 each Topical Daily   [MAR Hold] metoprolol  tartrate  12.5 mg Oral BID   [MAR Hold] multivitamin with minerals  1 tablet Oral Daily   [MAR Hold] mupirocin ointment  1 Application Nasal BID   [MAR Hold] pantoprazole   40 mg Oral Daily   povidone-iodine  2 Application Topical Once   Continuous Infusions:   ceFAZolin (ANCEF) IV     lactated ringers     vancomycin      Diet: Diet Order             Diet NPO time specified  Diet effective midnight                    Data Reviewed: I have personally reviewed following labs and imaging studies ( see epic result tab) CBC: Recent Labs  Lab 11/02/23 1609 11/03/23 1547 11/05/23 0233 11/06/23 0422  WBC 6.6  --  6.8 6.3  NEUTROABS 3.7  --   --   --   HGB 13.1 12.2 10.6* 9.9*  HCT 41.6 38.8 33.7* 31.9*  MCV 93.5  --  95.2 97.0  PLT 236  --  196 185   CMP: Recent Labs  Lab 11/02/23 1609 11/03/23 1547 11/04/23 0406 11/05/23 0233 11/06/23 0422  NA 138 140 137 139 136  K 4.6 5.0 4.7 4.8 4.5  CL 99 105 103 103  103  CO2 27 26 24 26 23   GLUCOSE 97 110* 99 97 105*  BUN 44* 40* 39* 44* 46*  CREATININE 1.90* 1.71* 1.78* 2.39* 2.05*  CALCIUM  9.4 8.5* 8.7* 8.3* 7.8*   GFR: Estimated Creatinine Clearance: 11.8 mL/min (A) (by C-G formula based on SCr of 2.05 mg/dL (H)). Recent Labs  Lab 11/03/23 1547 11/04/23 0406  AST 21 20  ALT 15 13  ALKPHOS 95 85  BILITOT 1.0 0.8  PROT 6.8 6.1*  ALBUMIN  3.1* 2.7*   No results for input(s): LIPASE, AMYLASE in the last 168 hours. No results for input(s): AMMONIA in the last 168 hours. Coagulation Profile:  Recent Labs  Lab 11/02/23 1609  INR  1.2   Unresulted Labs (From admission, onward)     Start     Ordered   11/05/23 0500  Basic metabolic panel with GFR  Daily,   R      11/04/23 0642   11/05/23 0500  CBC  Daily,   R      11/04/23 9357           Antimicrobials/Microbiology: Anti-infectives (From admission, onward)    Start     Dose/Rate Route Frequency Ordered Stop   11/06/23 0800  ceFAZolin (ANCEF) IVPB 2g/100 mL premix        2 g 200 mL/hr over 30 Minutes Intravenous On call to O.R. 11/06/23 0225 11/07/23 0559   11/06/23 0800  vancomycin  (VANCOCIN ) IVPB 1000 mg/200 mL premix        1,000 mg 200 mL/hr over 60 Minutes Intravenous On call to O.R. 11/06/23 0225 11/07/23 0559         Component Value Date/Time   SDES BLOOD RIGHT ARM 03/09/2023 2106   SPECREQUEST  03/09/2023 2106    BOTTLES DRAWN AEROBIC ONLY Blood Culture results may not be optimal due to an inadequate volume of blood received in culture bottles   CULT  03/09/2023 2106    NO GROWTH 5 DAYS Performed at [facility], [address], [place], [place] [address]    REPTSTATUS 03/14/2023 FINAL 03/09/2023 2106    Procedures: Procedure(s) (LRB): INCISION AND DRAINAGE OF DEEP ABSCESS, CALF (Left) APPLICATION, WOUND VAC (Left)   Mennie LAMY, MD [facility] 11/06/2023, 10:27 AM

## 2023-11-06 NOTE — Transfer of Care (Signed)
 Immediate Anesthesia Transfer of Care Note  Patient: Allison Norman  Procedure(s) Performed: IRRIGATION AND DEBRIDEMENT LEFT LEG (Left) APPLICATION, WOUND VAC (Left)  Patient Location: PACU  Anesthesia Type:General  Level of Consciousness: drowsy  Airway & Oxygen  Therapy: Patient connected to face mask oxygen   Post-op Assessment: Report given to RN and Post -op Vital signs reviewed and stable  Post vital signs: Reviewed and stable  Last Vitals:  Vitals Value Taken Time  BP 121/60 11/06/23 12:10  Temp 36.9 C 11/06/23 12:10  Pulse 60 11/06/23 12:10  Resp 13 11/06/23 12:10  SpO2 98 % 11/06/23 12:10  Vitals shown include unfiled device data.  Last Pain:  Vitals:   11/06/23 1041  TempSrc:   PainSc: 0-No pain      Patients Stated Pain Goal: 0 (11/05/23 2020)  Complications: No notable events documented.

## 2023-11-06 NOTE — Plan of Care (Signed)
  Problem: Education: Goal: Knowledge of General Education information will improve Description: Including pain rating scale, medication(s)/side effects and non-pharmacologic comfort measures 11/06/2023 0535 by Mahlon Bolt, Suan HERO, RN Outcome: Progressing 11/06/2023 0529 by Mahlon Bolt, Franki Stemen M, RN Outcome: Progressing   Problem: Health Behavior/Discharge Planning: Goal: Ability to manage health-related needs will improve 11/06/2023 0535 by Mahlon Bolt, Suan HERO, RN Outcome: Progressing 11/06/2023 0529 by Mahlon Bolt, Aundraya Dripps M, RN Outcome: Progressing   Problem: Clinical Measurements: Goal: Ability to maintain clinical measurements within normal limits will improve 11/06/2023 0535 by Mahlon Bolt, Suan HERO, RN Outcome: Progressing 11/06/2023 0529 by Mahlon Bolt, Matthieu Loftus M, RN Outcome: Progressing Goal: Will remain free from infection 11/06/2023 0535 by Mahlon Bolt, Suan HERO, RN Outcome: Progressing 11/06/2023 0529 by Mahlon Bolt, Suan HERO, RN Outcome: Progressing Goal: Diagnostic test results will improve 11/06/2023 0535 by Mahlon Bolt, Suan HERO, RN Outcome: Progressing 11/06/2023 0529 by Mahlon Bolt, Suan HERO, RN Outcome: Progressing Goal: Respiratory complications will improve 11/06/2023 0535 by Mahlon Bolt, Suan HERO, RN Outcome: Progressing 11/06/2023 0529 by Mahlon Bolt, Anton Cheramie HERO, RN Outcome: Progressing Goal: Cardiovascular complication will be avoided 11/06/2023 0535 by Mahlon Bolt, Suan HERO, RN Outcome: Progressing 11/06/2023 0529 by Mahlon Bolt, Lisa Blakeman HERO, RN Outcome: Progressing   Problem: Activity: Goal: Risk for activity intolerance will decrease 11/06/2023 0535 by Mahlon Bolt, Suan HERO, RN Outcome: Progressing 11/06/2023 0529 by Mahlon Bolt, Daven Montz M, RN Outcome: Progressing   Problem: Nutrition: Goal: Adequate nutrition will be maintained 11/06/2023 0535 by Mahlon Bolt, Suan HERO, RN Outcome:  Progressing 11/06/2023 0529 by Mahlon Bolt, Elexa Kivi M, RN Outcome: Progressing   Problem: Coping: Goal: Level of anxiety will decrease 11/06/2023 0535 by Mahlon Bolt, Suan HERO, RN Outcome: Progressing 11/06/2023 0529 by Mahlon Bolt, Javar Eshbach M, RN Outcome: Progressing   Problem: Elimination: Goal: Will not experience complications related to bowel motility 11/06/2023 0535 by Mahlon Bolt, Suan HERO, RN Outcome: Progressing 11/06/2023 0529 by Mahlon Bolt, Jniyah Dantuono M, RN Outcome: Progressing Goal: Will not experience complications related to urinary retention 11/06/2023 0535 by Mahlon Bolt, Suan HERO, RN Outcome: Progressing 11/06/2023 0529 by Mahlon Bolt, Anav Lammert M, RN Outcome: Progressing   Problem: Pain Managment: Goal: General experience of comfort will improve and/or be controlled 11/06/2023 0535 by Mahlon Bolt, Suan HERO, RN Outcome: Progressing 11/06/2023 0529 by Mahlon Bolt, Vasiliki Smaldone M, RN Outcome: Progressing   Problem: Safety: Goal: Ability to remain free from injury will improve 11/06/2023 0535 by Mahlon Bolt, Suan HERO, RN Outcome: Progressing 11/06/2023 0529 by Mahlon Bolt, Charlyne Robertshaw M, RN Outcome: Progressing   Problem: Skin Integrity: Goal: Risk for impaired skin integrity will decrease 11/06/2023 0535 by Mahlon Bolt, Suan HERO, RN Outcome: Progressing 11/06/2023 0529 by Mahlon Bolt, Suan HERO, RN Outcome: Progressing

## 2023-11-06 NOTE — Interval H&P Note (Signed)
 History and Physical Interval Note:  11/06/2023 6:41 AM  [name]  has presented today for surgery, with the diagnosis of Hematoma Left Leg.  The various methods of treatment have been discussed with the patient and family. After consideration of risks, benefits and other options for treatment, the patient has consented to  Procedure(s) with comments: INCISION AND DRAINAGE OF DEEP ABSCESS, CALF (Left) - LEFT LEG DEBRIDEMENT APPLICATION, WOUND VAC (Left) as a surgical intervention.  The patient's history has been reviewed, patient examined, no change in status, stable for surgery.  I have reviewed the patient's chart and labs.  Questions were answered to the patient's satisfaction.     Lakresha Stifter V Jorgina Binning

## 2023-11-06 NOTE — Anesthesia Procedure Notes (Signed)
 Procedure Name: LMA Insertion Date/Time: 11/06/2023 11:35 AM  Performed by: Roslynn Waddell LABOR, CRNAPre-anesthesia Checklist: Patient identified, Emergency Drugs available, Suction available and Patient being monitored Patient Re-evaluated:Patient Re-evaluated prior to induction Oxygen  Delivery Method: Circle System Utilized Preoxygenation: Pre-oxygenation with 100% oxygen  Induction Type: IV induction LMA: LMA inserted LMA Size: 4.0 Number of attempts: 1 Placement Confirmation: positive ETCO2 Tube secured with: Tape Dental Injury: Teeth and Oropharynx as per pre-operative assessment  Comments: Atraumatic induction/LMA insertion. Dention and oral mucosa as per preop.

## 2023-11-06 NOTE — Anesthesia Preprocedure Evaluation (Addendum)
 Anesthesia Evaluation  Patient identified by MRN, date of birth, ID band Patient awake    Reviewed: Allergy & Precautions, NPO status , Patient's Chart, lab work & pertinent test results, reviewed documented beta blocker date and time   History of Anesthesia Complications Negative for: history of anesthetic complications  Airway Mallampati: II  TM Distance: >3 FB     Dental no notable dental hx.    Pulmonary shortness of breath, pneumonia, former smoker   breath sounds clear to auscultation       Cardiovascular hypertension, +CHF  + dysrhythmias Atrial Fibrillation + pacemaker  Rhythm:Regular Rate:Normal  PPM Scheduled remote reviewed. Normal device function.  Presenting rhythm: AP/VS  Hx of PAF, controlled rates, burden 43%, Eliquis  per EPIC     Neuro/Psych neg Seizures    GI/Hepatic   Endo/Other    Renal/GU Renal disease     Musculoskeletal  (+) Arthritis ,    Abdominal   Peds  Hematology  (+) Blood dyscrasia, anemia   Anesthesia Other Findings   Reproductive/Obstetrics                              Anesthesia Physical Anesthesia Plan  ASA: 3  Anesthesia Plan: General   Post-op Pain Management:    Induction: Intravenous  PONV Risk Score and Plan: 2 and Ondansetron   Airway Management Planned: LMA  Additional Equipment:   Intra-op Plan:   Post-operative Plan: Extubation in OR  Informed Consent: I have reviewed the patients History and Physical, chart, labs and discussed the procedure including the risks, benefits and alternatives for the proposed anesthesia with the patient or authorized representative who has indicated his/her understanding and acceptance.     Dental advisory given  Plan Discussed with: CRNA  Anesthesia Plan Comments:          Anesthesia Quick Evaluation

## 2023-11-06 NOTE — Anesthesia Postprocedure Evaluation (Signed)
 Anesthesia Post Note  Patient: Allison Norman  Procedure(s) Performed: IRRIGATION AND DEBRIDEMENT LEFT LEG (Left) APPLICATION, WOUND VAC (Left)     Patient location during evaluation: PACU Anesthesia Type: General Level of consciousness: awake and alert Pain management: pain level controlled Vital Signs Assessment: post-procedure vital signs reviewed and stable Respiratory status: spontaneous breathing, nonlabored ventilation, respiratory function stable and patient connected to nasal cannula oxygen  Cardiovascular status: blood pressure returned to baseline and stable Postop Assessment: no apparent nausea or vomiting Anesthetic complications: no   No notable events documented.  Last Vitals:  Vitals:   11/06/23 1330 11/06/23 1349  BP: 126/62 121/71  Pulse: 61 60  Resp: 14 14  Temp: 36.7 C 36.9 C  SpO2: 97% 96%    Last Pain:  Vitals:   11/06/23 1349  TempSrc: Oral  PainSc: 2                  Lynwood MARLA Cornea

## 2023-11-06 NOTE — Plan of Care (Signed)

## 2023-11-07 ENCOUNTER — Encounter (HOSPITAL_COMMUNITY): Payer: Self-pay | Admitting: Orthopedic Surgery

## 2023-11-07 DIAGNOSIS — S8012XA Contusion of left lower leg, initial encounter: Secondary | ICD-10-CM | POA: Diagnosis not present

## 2023-11-07 LAB — CBC
HCT: 32.1 % — ABNORMAL LOW (ref 36.0–46.0)
Hemoglobin: 10.2 g/dL — ABNORMAL LOW (ref 12.0–15.0)
MCH: 30.3 pg (ref 26.0–34.0)
MCHC: 31.8 g/dL (ref 30.0–36.0)
MCV: 95.3 fL (ref 80.0–100.0)
Platelets: 199 K/uL (ref 150–400)
RBC: 3.37 MIL/uL — ABNORMAL LOW (ref 3.87–5.11)
RDW: 19.8 % — ABNORMAL HIGH (ref 11.5–15.5)
WBC: 5.9 K/uL (ref 4.0–10.5)
nRBC: 0 % (ref 0.0–0.2)

## 2023-11-07 LAB — BASIC METABOLIC PANEL WITH GFR
Anion gap: 11 (ref 5–15)
BUN: 45 mg/dL — ABNORMAL HIGH (ref 8–23)
CO2: 22 mmol/L (ref 22–32)
Calcium: 7.6 mg/dL — ABNORMAL LOW (ref 8.9–10.3)
Chloride: 103 mmol/L (ref 98–111)
Creatinine, Ser: 1.97 mg/dL — ABNORMAL HIGH (ref 0.44–1.00)
GFR, Estimated: 23 mL/min — ABNORMAL LOW (ref >60)
Glucose, Bld: 135 mg/dL — ABNORMAL HIGH (ref 70–99)
Potassium: 4.9 mmol/L (ref 3.5–5.1)
Sodium: 136 mmol/L (ref 135–145)

## 2023-11-07 NOTE — Progress Notes (Signed)
 PROGRESS NOTE Allison Norman  FMW:[id number] DOB: 08-19-1924 DOA: 11/02/2023 PCP: Okey Carlin Redbird, MD  Brief Narrative/Hospital Course: Allison Norman is a 89 y.o. female with PMH of Afib s/p DCCV in 2017 with re-occurrence in 2018, HTN, CKD3b, and breast/skin cancer, chronic resp failure on 2L [place], aortic stenosis presenting w/ pain and wound on LLE after she fell when a shower line fell out of her hand swinga and folded on her left shin. She developed intermediate large bruise over the affected area. Patient noted to have been admitted July 22 through July 30 for issues including acute on chronic heart failure to which she is placed in the hospital home program and otherwise stable course.Has a around-the-clock caregiver support at home.  in ER: afebrile, hemodynamically stable.  Satting 88% on room air.  Transitioned to 2 L nasal cannula to keep O2 sats.  96%.  White count 6.6, hemoglobin 13.1, platelets 236.  Creatinine 1.9.  Left tib-fib showing no acute fracture and soft tissue density measuring 10, anterior extremity concerning for hematoma. XRAY TIBIA/Fibula: No acute fracture. Soft tissue density measuring up to 10 cm in the anterior lower extremity likely related to hematoma. Dr. Harden has been consulted  Subjective: Seen and examined Caregiver at the bedside no new complaints Overnight vital stable afebrile Labs shows creatinine this morning downtrending further 1.9 hemoglobin slightly up 10.2  Assessment and plan:  Posttraumatic pretibial hematoma of left lower extremity, initial encounter LLE hematoma s/p traumatic strike with shower wand ABLA in the setting of chronic anemia due to #1: Left lower extremity x-ray stable seen by Dr Harden S/p excisional debridement hematoma left leg application of Kerecis micro graft, wound VAC Left lower extremity on wound VAC, okay for weightbearing on left lower extremity per Dr. Harden on discharge plan for Prevena with portable wound  VAC Continue multimodal pain management, monitor labs, hb Baseline hemoglobin around 10 g.  Slightly downtrending in the setting of ABLA due to #1.  Monitor transfuse if less than 7 g Recent Labs  Lab 11/02/23 1609 11/03/23 1547 11/05/23 0233 11/06/23 0422 11/07/23 0430  HGB 13.1 12.2 10.6* 9.9* 10.2*  HCT 41.6 38.8 33.7* 31.9* 32.1*     AKI on CKD  stage IV: baseline scr 1.7-2.0, + high fluid hydration Lasix  held 10/30, creatinine slowly improving continue gentle IV fluid hydration, avoid hypotension nephrotoxic medication and dehydration and hold Lasix .This could be in the setting of patient's hematoma ABLA, volume depletion and diuretics. Recent Labs    08/02/23 1114 08/03/23 1013 08/04/23 1018 08/10/23 1313 11/02/23 1609 11/03/23 1547 11/04/23 0406 11/05/23 0233 11/06/23 0422 11/07/23 0430  BUN 36* 33* 32* 32 44* 40* 39* 44* 46* 45*  CREATININE 1.82* 1.73* 1.75* 1.60* 1.90* 1.71* 1.78* 2.39* 2.05* 1.97*  CO2 30 30 32 27 27 26 24 26 23  22  K 4.2 4.3 3.9 4.8 4.6 5.0 4.7 4.8 4.5 4.9     PAF: Baseline atrial fibrillation on eliquis  and amiodarone  . Holding eliquis  in setting of large LLE traumatic hematoma    Chronic respiratory failure with hypoxia: On 2L [place] chronically , at baseline Monitor    Chronic diastolic CHF 2D ECHO 03/2022 w/ EF 65-70% and grade 2 diastolic dysfunction.  Holding Lasix  due to AKI  Severe malnutrition w/ Body mass index is 17.21 kg/m.: RD consulted  Mobility: PT Orders: Active  PT Follow up Rec: Home Health Pt10/29/2025 1508   DVT prophylaxis:  Code Status:   Code Status: Limited: Do not attempt  resuscitation (DNR) -DNR-LIMITED -Do Not Intubate/DNI  Family Communication: plan of care discussed with patient/caregiver at bedside . Patient status is: Remains hospitalized because of severity of illness Level of care: Telemetry   Dispo: The patient is from: home            Anticipated disposition: TBD Objective: Vitals last 24  hrs: Vitals:   11/07/23 0415 11/07/23 0900 11/07/23 1057 11/07/23 1100  BP: 117/65 (!) 111/55 (!) 115/59   Pulse: 61 68 (!) 59 60  Resp: 19 15 19 16   Temp: 97.8 F (36.6 C) 98.3 F (36.8 C)    TempSrc: Oral Oral    SpO2: 98% 98% 98% 96%  Weight:      Height:       Physical Examination: General exam: AAOX3 HEENT:Oral mucosa moist, Ear/Nose WNL grossly Respiratory system: Bilaterally clear BS,no use of accessory muscle Cardiovascular system: S1 & S2 +, No JVD. Gastrointestinal system: Abdomen soft,NT,ND, BS+ Nervous System: Alert, awake non focal Extremities: extremities warm, LLE area of hematoma w/ swelling/ dressing c/d/I Skin: Warm, no rashes MSK: Normal muscle bulk,tone, power   Medications reviewed:  Scheduled Meds:  acetaminophen   650 mg Oral Once   amiodarone   100 mg Oral Daily   Chlorhexidine  Gluconate Cloth  6 each Topical Daily   metoprolol  tartrate  12.5 mg Oral BID   multivitamin with minerals  1 tablet Oral Daily   mupirocin ointment  1 Application Nasal BID   pantoprazole   40 mg Oral Daily   Continuous Infusions:   Diet: Diet Order             Diet Heart Room service appropriate? Yes; Fluid consistency: Thin  Diet effective now                    Data Reviewed: I have personally reviewed following labs and imaging studies ( see epic result tab) CBC: Recent Labs  Lab 11/02/23 1609 11/03/23 1547 11/05/23 0233 11/06/23 0422 11/07/23 0430  WBC 6.6  --  6.8 6.3 5.9  NEUTROABS 3.7  --   --   --   --   HGB 13.1 12.2 10.6* 9.9* 10.2*  HCT 41.6 38.8 33.7* 31.9* 32.1*  MCV 93.5  --  95.2 97.0 95.3  PLT 236  --  196 185 199   CMP: Recent Labs  Lab 11/03/23 1547 11/04/23 0406 11/05/23 0233 11/06/23 0422 11/07/23 0430  NA 140 137 139 136 136  K 5.0 4.7 4.8 4.5 4.9  CL 105 103 103 103 103  CO2 26 24 26 23 22   GLUCOSE 110* 99 97 105* 135*  BUN 40* 39* 44* 46* 45*  CREATININE 1.71* 1.78* 2.39* 2.05* 1.97*  CALCIUM  8.5* 8.7* 8.3* 7.8* 7.6*    GFR: Estimated Creatinine Clearance: 12.3 mL/min (A) (by C-G formula based on SCr of 1.97 mg/dL (H)). Recent Labs  Lab 11/03/23 1547 11/04/23 0406  AST 21 20  ALT 15 13  ALKPHOS 95 85  BILITOT 1.0 0.8  PROT 6.8 6.1*  ALBUMIN  3.1* 2.7*   No results for input(s): LIPASE, AMYLASE in the last 168 hours. No results for input(s): AMMONIA in the last 168 hours. Coagulation Profile:  Recent Labs  Lab 11/02/23 1609  INR 1.2   Unresulted Labs (From admission, onward)     Start     Ordered   11/05/23 0500  Basic metabolic panel with GFR  Daily,   R      11/04/23 0642   11/05/23 0500  CBC  Daily,   R      11/04/23 9357           Antimicrobials/Microbiology: Anti-infectives (From admission, onward)    Start     Dose/Rate Route Frequency Ordered Stop   11/06/23 1530  vancomycin  (VANCOREADY) IVPB 750 mg/150 mL        750 mg 150 mL/hr over 60 Minutes Intravenous  Once 11/06/23 1436 11/06/23 1900   11/06/23 0800  ceFAZolin (ANCEF) IVPB 2g/100 mL premix        2 g 200 mL/hr over 30 Minutes Intravenous On call to O.R. 11/06/23 0225 11/06/23 1151   11/06/23 0800  vancomycin  (VANCOCIN ) IVPB 1000 mg/200 mL premix  Status:  Discontinued        1,000 mg 200 mL/hr over 60 Minutes Intravenous On call to O.R. 11/06/23 0225 11/06/23 1345         Component Value Date/Time   SDES BLOOD RIGHT ARM 03/09/2023 2106   SPECREQUEST  03/09/2023 2106    BOTTLES DRAWN AEROBIC ONLY Blood Culture results may not be optimal due to an inadequate volume of blood received in culture bottles   CULT  03/09/2023 2106    NO GROWTH 5 DAYS Performed at [facility], [address], [place], [place] [address]    REPTSTATUS 03/14/2023 FINAL 03/09/2023 2106    Procedures: Procedure(s) (LRB): IRRIGATION AND DEBRIDEMENT LEFT LEG (Left) APPLICATION, WOUND VAC (Left)   Mennie LAMY, MD [facility] 11/07/2023, 12:08 PM

## 2023-11-07 NOTE — Progress Notes (Signed)
 Physical Therapy Treatment Patient Details Name: Allison Norman MRN: [id number] DOB: 04/26/1924 Today's Date: 11/07/2023   History of Present Illness The pt is a 89 yo female presenting 10/27 with hematoma to L shin. I and D completed 10/31. PMH includes: afib on Eliquis , CHF, CKD, breast cancer, chronic resp failure on 2L, pacemaker.    PT Comments  Pt progressing well toward goals, but not at mod I yet.  Emphasis on scooting, transitions to edge of the chair, STS safety and stability with progression of gait using RW and a light Minimal assist.     If plan is discharge home, recommend the following: A little help with walking and/or transfers;A little help with bathing/dressing/bathroom;Assistance with cooking/housework;Assist for transportation;Help with stairs or ramp for entrance   Can travel by private vehicle        Equipment Recommendations  None recommended by PT    Recommendations for Other Services       Precautions / Restrictions Precautions Precautions: Fall Precaution/Restrictions Comments: L shin VAC dressing Restrictions Other Position/Activity Restrictions: WBAT     Mobility  Bed Mobility               General bed mobility comments: OOB in the recliner.    Transfers Overall transfer level: Needs assistance Equipment used: Rolling walker (2 wheels) Transfers: Sit to/from Stand Sit to Stand: Min assist           General transfer comment: cues for hand placement, minimal boost and assist forward.    Ambulation/Gait Ambulation/Gait assistance: Min assist Gait Distance (Feet): 90 Feet Assistive device: Rolling walker (2 wheels) Gait Pattern/deviations: Step-through pattern Gait velocity: decreased Gait velocity interpretation: <1.31 ft/sec, indicative of household ambulator   General Gait Details: slow mildly unsteady and narrow gait.  But overall safe with the RW and minimal stability and /or walker maneuvering assist   Stairs              Wheelchair Mobility     Tilt Bed    Modified Rankin (Stroke Patients Only)       Balance Overall balance assessment: Needs assistance Sitting-balance support: No upper extremity supported, Feet supported Sitting balance-Leahy Scale: Good     Standing balance support: Bilateral upper extremity supported, During functional activity Standing balance-Leahy Scale: Poor Standing balance comment: reliant on the RW or external support.  mild posterior bias.                            Communication Communication Communication: No apparent difficulties Factors Affecting Communication: Hearing impaired  Cognition Arousal: Alert Behavior During Therapy: WFL for tasks assessed/performed   PT - Cognitive impairments: No apparent impairments                         Following commands: Intact      Cueing Cueing Techniques: Verbal cues  Exercises      General Comments General comments (skin integrity, edema, etc.): VSS on 2L [place]      Pertinent Vitals/Pain Pain Assessment Pain Assessment: Faces Faces Pain Scale: Hurts a little bit Pain Location: L LE wound, dressing in place Pain Descriptors / Indicators: Discomfort Pain Intervention(s): Monitored during session, Premedicated before session    Home Living                          Prior Function  PT Goals (current goals can now be found in the care plan section) Acute Rehab PT Goals Patient Stated Goal: to return home PT Goal Formulation: With patient Time For Goal Achievement: 11/18/23 Potential to Achieve Goals: Good Progress towards PT goals: Progressing toward goals    Frequency    Min 2X/week      PT Plan      Co-evaluation              AM-PAC PT 6 Clicks Mobility   Outcome Measure  Help needed turning from your back to your side while in a flat bed without using bedrails?: A Little Help needed moving from lying on your back to sitting on the  side of a flat bed without using bedrails?: A Little Help needed moving to and from a bed to a chair (including a wheelchair)?: A Lot Help needed standing up from a chair using your arms (e.g., wheelchair or bedside chair)?: A Little Help needed to walk in hospital room?: A Little Help needed climbing 3-5 steps with a railing? : A Lot 6 Click Score: 16    End of Session Equipment Utilized During Treatment: Oxygen  Activity Tolerance: Patient tolerated treatment well (pt managed adequately with dilaudid) Patient left: in chair;with call bell/phone within reach;with family/visitor present Nurse Communication: Mobility status PT Visit Diagnosis: Unsteadiness on feet (R26.81);Other abnormalities of gait and mobility (R26.89);Muscle weakness (generalized) (M62.81);History of falling (Z91.81);Pain Pain - Right/Left: Left Pain - part of body: Leg     Time: 8384-8366 PT Time Calculation (min) (ACUTE ONLY): 18 min  Charges:    $Gait Training: 8-22 mins PT General Charges $$ ACUTE PT VISIT: 1 Visit                     11/07/2023  Allison Norman 11/07/2023, 5:02 PM

## 2023-11-07 NOTE — Progress Notes (Signed)
 Patient ID: Allison Norman, female   DOB: 17-Jun-1924, 89 y.o.   MRN: [id number] Patient is postoperative day 1 debridement hematoma left calf.  Patient states she initially had pain in the recovery room she states she is not painful at this time.  There is 50 cc in the wound VAC canister.  Patient may be full weightbearing on the left lower extremity without restrictions.  There is 50 cc in the wound VAC canister.  Patient will discharge with a Prevena plus portable wound VAC pump and I will follow-up in the office in 1 week.

## 2023-11-08 DIAGNOSIS — S8012XA Contusion of left lower leg, initial encounter: Secondary | ICD-10-CM | POA: Diagnosis not present

## 2023-11-08 LAB — BASIC METABOLIC PANEL WITH GFR
Anion gap: 10 (ref 5–15)
BUN: 51 mg/dL — ABNORMAL HIGH (ref 8–23)
CO2: 23 mmol/L (ref 22–32)
Calcium: 7.9 mg/dL — ABNORMAL LOW (ref 8.9–10.3)
Chloride: 104 mmol/L (ref 98–111)
Creatinine, Ser: 2.19 mg/dL — ABNORMAL HIGH (ref 0.44–1.00)
GFR, Estimated: 20 mL/min — ABNORMAL LOW (ref >60)
Glucose, Bld: 91 mg/dL (ref 70–99)
Potassium: 4.4 mmol/L (ref 3.5–5.1)
Sodium: 137 mmol/L (ref 135–145)

## 2023-11-08 LAB — CBC
HCT: 34.4 % — ABNORMAL LOW (ref 36.0–46.0)
Hemoglobin: 10.6 g/dL — ABNORMAL LOW (ref 12.0–15.0)
MCH: 30.2 pg (ref 26.0–34.0)
MCHC: 30.8 g/dL (ref 30.0–36.0)
MCV: 98 fL (ref 80.0–100.0)
Platelets: 217 K/uL (ref 150–400)
RBC: 3.51 MIL/uL — ABNORMAL LOW (ref 3.87–5.11)
RDW: 20.2 % — ABNORMAL HIGH (ref 11.5–15.5)
WBC: 7.7 K/uL (ref 4.0–10.5)
nRBC: 0 % (ref 0.0–0.2)

## 2023-11-08 NOTE — Progress Notes (Signed)
 Occupational Therapy Treatment Patient Details Name: Allison Norman MRN: [id number] DOB: 1924/10/03 Today's Date: 11/08/2023   History of present illness The pt is a 89 yo female presenting 10/27 with hematoma to L shin. I and D completed 10/31. PMH includes: afib on Eliquis , CHF, CKD, breast cancer, chronic resp failure on 2L, pacemaker.   OT comments  Pt. Seen for skilled OT treatment session with caregiver present and g.dtr present towards end of session.  Pt. Able to complete LB dressing with MINA  for management over vac dressing LLE but no assistance required otherwise.  In room ambulation and transfer to bsc with CGA/MIN A. Pt. With good recall of sequencing and safety for transfers.  Able to initiate reaching for arm rests prior to sit and stand w/o cues. Cont. With acute OT POC.        If plan is discharge home, recommend the following:  A lot of help with bathing/dressing/bathroom;A little help with walking and/or transfers;Assistance with cooking/housework;Assist for transportation;Help with stairs or ramp for entrance   Equipment Recommendations  None recommended by OT    Recommendations for Other Services      Precautions / Restrictions Precautions Precautions: Fall Precaution/Restrictions Comments: L shin VAC dressing Restrictions Other Position/Activity Restrictions: WBAT       Mobility Bed Mobility               General bed mobility comments: OOB in the recliner.    Transfers Overall transfer level: Needs assistance Equipment used: Rolling walker (2 wheels) Transfers: Sit to/from Stand, Bed to chair/wheelchair/BSC Sit to Stand: Contact guard assist, Min assist-would have benefited from [facility] but did not want me assisting, wanted to complete on her own without therapist asst. support     Step pivot transfers: Min assist, Contact guard assist     General transfer comment: cues for hand placement, minimal boost and assist forward.-pt. states i want to  do it myself during attempt to sit/stand, states that she has thin skin and yall hurt me when you try and help.  pt. also with good recall of reach for arm rests prior to sitting down, and before standing up. states ive had so much therapy i know all of this youre gonna tell me     Balance                                           ADL either performed or assessed with clinical judgement   ADL Overall ADL's : Needs assistance/impaired                     Lower Body Dressing: Minimal assistance;Sitting/lateral leans Lower Body Dressing Details (indicate cue type and reason): able to don sock over LLE with MINA secondary to sock difficult to get over wound vac adhesive/wrap Toilet Transfer: Minimal assistance;Contact guard assist;Rolling walker (2 wheels);Ambulation;BSC/3in1;Cueing for safety Toilet Transfer Details (indicate cue type and reason): in room ambulation from recliner to bsc         Functional mobility during ADLs: Minimal assistance;Contact guard assist;Rolling walker (2 wheels);Cueing for safety      Extremity/Trunk Assessment              Vision       Perception     Praxis     Communication Communication Communication: No apparent difficulties Factors Affecting Communication: Hearing impaired   Cognition  Arousal: Alert Behavior During Therapy: WFL for tasks assessed/performed Cognition: No apparent impairments                               Following commands: Intact        Cueing   Cueing Techniques: Verbal cues  Exercises      Shoulder Instructions       General Comments      Pertinent Vitals/ Pain       Pain Assessment Pain Assessment: No/denies pain  Home Living                                          Prior Functioning/Environment              Frequency  Min 2X/week        Progress Toward Goals  OT Goals(current goals can now be found in the care plan  section)  Progress towards OT goals: Progressing toward goals     Plan      Co-evaluation                 AM-PAC OT 6 Clicks Daily Activity     Outcome Measure   Help from another person eating meals?: None Help from another person taking care of personal grooming?: A Little Help from another person toileting, which includes using toliet, bedpan, or urinal?: A Little Help from another person bathing (including washing, rinsing, drying)?: A Lot Help from another person to put on and taking off regular upper body clothing?: A Little Help from another person to put on and taking off regular lower body clothing?: A Lot 6 Click Score: 17    End of Session Equipment Utilized During Treatment: Rolling walker (2 wheels);Gait belt  OT Visit Diagnosis: Other abnormalities of gait and mobility (R26.89);Unsteadiness on feet (R26.81);History of falling (Z91.81);Muscle weakness (generalized) (M62.81)   Activity Tolerance Patient tolerated treatment well   Patient Left in chair;with call bell/phone within reach;with chair alarm set;with family/visitor present   Nurse Communication Other (comment) (rn states ok to work with pt.)        Time: 1251-1301 OT Time Calculation (min): 10 min  Charges: OT General Charges $OT Visit: 1 Visit OT Treatments $Self Care/Home Management : 8-22 mins  Randall, COTA/L Acute Rehabilitation [phone]   Allison Norman-COTA/L  11/08/2023, 2:02 PM

## 2023-11-08 NOTE — Progress Notes (Signed)
 PROGRESS NOTE Allison Norman  FMW:[id number] DOB: 11-May-1924 DOA: 11/02/2023 PCP: Okey Carlin Redbird, MD  Brief Narrative/Hospital Course: Allison Norman is a 89 y.o. female with PMH of Afib s/p DCCV in 2017 with re-occurrence in 2018, HTN, CKD3b, and breast/skin cancer, chronic resp failure on 2L [place], aortic stenosis presenting w/ pain and wound on LLE after she fell when a shower line fell out of her hand swinga and folded on her left shin. She developed intermediate large bruise over the affected area. Patient noted to have been admitted July 22 through July 30 for issues including acute on chronic heart failure to which she is placed in the hospital home program and otherwise stable course.Has a around-the-clock caregiver support at home.  in ER: afebrile, hemodynamically stable.  Satting 88% on room air.  Transitioned to 2 L nasal cannula to keep O2 sats.  96%.  White count 6.6, hemoglobin 13.1, platelets 236.  Creatinine 1.9.  Left tib-fib showing no acute fracture and soft tissue density measuring 10, anterior extremity concerning for hematoma. XRAY TIBIA/Fibula: No acute fracture. Soft tissue density measuring up to 10 cm in the anterior lower extremity likely related to hematoma. Dr. Harden has been consulted  Subjective: Seen and examined On the bedside chair, left leg feels better on wound VAC.  Caregiver at the bedside Some mild coughing with taking pills Overnight events afebrile BP stable Labs shows creatinine at 2.19  Assessment and plan:  Posttraumatic pretibial hematoma of left lower extremity, initial encounter LLE hematoma s/p traumatic strike with shower wand ABLA in the setting of chronic anemia due to #1: Left lower extremity x-ray stable seen by Dr Harden S/p excisional debridement hematoma left leg application of Kerecis micro graft, wound VAC Left lower extremity on wound VAC, okay for weightbearing on  LLE On discharge plan for Prevena with portable wound VAC Continue  multimodal pain management, monitor labs, hb Baseline hemoglobin around 10 g.  Slightly downtrending in the setting of ABLA due to #1. Monitor Recent Labs  Lab 11/03/23 1547 11/05/23 0233 11/06/23 0422 11/07/23 0430 11/08/23 0520  HGB 12.2 10.6* 9.9* 10.2* 10.6*  HCT 38.8 33.7* 31.9* 32.1* 34.4*     AKI  vs progressive CKD  stage IV: baseline scr 1.7-2.0,-when looking blood work back in July 25. Lasix  held 10/30, creatinine has been fluctuating-1.9-2.1 range-no significant changes despite IV fluids.no episode of hypotension no nephrotoxic meds use.  I suspect this is progressive CKD close to baseline with some component of AKI .if creatinine remains stable by 11/3 will stop ivf Recent Labs    08/03/23 1013 08/04/23 1018 08/10/23 1313 11/02/23 1609 11/03/23 1547 11/04/23 0406 11/05/23 0233 11/06/23 0422 11/07/23 0430 11/08/23 0520  BUN 33* 32* 32 44* 40* 39* 44* 46* 45* 51*  CREATININE 1.73* 1.75* 1.60* 1.90* 1.71* 1.78* 2.39* 2.05* 1.97* 2.19*  CO2 30 32 27 27 26 24 26 23 22  23  K 4.3 3.9 4.8 4.6 5.0 4.7 4.8 4.5 4.9 4.4     PAF: Baseline atrial fibrillation on eliquis  and amiodarone  . Holding eliquis  in setting of large LLE traumatic hematoma    Chronic respiratory failure with hypoxia: On 2L [place] chronically , at baseline Monitor    Chronic diastolic CHF 2D ECHO 03/2022 w/ EF 65-70% and grade 2 diastolic dysfunction.  Holding Lasix  due to AKI  Severe malnutrition w/ Body mass index is 17.21 kg/m.: RD consulted  Mobility: PT Orders: Active  PT Follow up Rec: Home Health Pt11/01/2023 1646  DVT prophylaxis:  Code Status:   Code Status: Limited: Do not attempt resuscitation (DNR) -DNR-LIMITED -Do Not Intubate/DNI  Family Communication: plan of care discussed with patient/caregiver at bedside . Patient status is: Remains hospitalized because of severity of illness Level of care: Telemetry   Dispo: The patient is from: home            Anticipated disposition:  home if  creat stable in 24 hrs Objective: Vitals last 24 hrs: Vitals:   11/07/23 1709 11/07/23 2100 11/07/23 2307 11/08/23 0500  BP: (!) 160/70 (!) 123/58 (!) 123/58 (!) 144/66  Pulse: 60  60 60  Resp: 19   15  Temp: 98.2 F (36.8 C) 97.9 F (36.6 C) 97.6 F (36.4 C) 98 F (36.7 C)  TempSrc: Oral Oral Oral Oral  SpO2: 98%  95% 97%  Weight:      Height:       Physical Examination: General exam: AAOX3, thin HEENT:Oral mucosa moist, Ear/Nose WNL grossly Respiratory system: Bilaterally clear BS,no use of accessory muscle Cardiovascular system: S1 & S2 +, No JVD. Gastrointestinal system: Abdomen soft,NT,ND, BS+ Nervous System: Alert, awake non focal Extremities: extremities warm, LLE surgical site/wound site with wound VAC in place  Skin: Warm, no rashes MSK: Normal muscle bulk,tone, power   Medications reviewed:  Scheduled Meds:  acetaminophen   650 mg Oral Once   amiodarone   100 mg Oral Daily   Chlorhexidine  Gluconate Cloth  6 each Topical Daily   metoprolol  tartrate  12.5 mg Oral BID   multivitamin with minerals  1 tablet Oral Daily   mupirocin ointment  1 Application Nasal BID   pantoprazole   40 mg Oral Daily   Continuous Infusions:   Diet: Diet Order             Diet Heart Room service appropriate? Yes; Fluid consistency: Thin  Diet effective now                    Data Reviewed: I have personally reviewed following labs and imaging studies ( see epic result tab) CBC: Recent Labs  Lab 11/02/23 1609 11/03/23 1547 11/05/23 0233 11/06/23 0422 11/07/23 0430 11/08/23 0520  WBC 6.6  --  6.8 6.3 5.9 7.7  NEUTROABS 3.7  --   --   --   --   --   HGB 13.1 12.2 10.6* 9.9* 10.2* 10.6*  HCT 41.6 38.8 33.7* 31.9* 32.1* 34.4*  MCV 93.5  --  95.2 97.0 95.3 98.0  PLT 236  --  196 185 199 217   CMP: Recent Labs  Lab 11/04/23 0406 11/05/23 0233 11/06/23 0422 11/07/23 0430 11/08/23 0520  NA 137 139 136 136 137  K 4.7 4.8 4.5 4.9 4.4  CL 103 103 103 103 104  CO2 24  26 23 22 23   GLUCOSE 99 97 105* 135* 91  BUN 39* 44* 46* 45* 51*  CREATININE 1.78* 2.39* 2.05* 1.97* 2.19*  CALCIUM  8.7* 8.3* 7.8* 7.6* 7.9*   GFR: Estimated Creatinine Clearance: 11 mL/min (A) (by C-G formula based on SCr of 2.19 mg/dL (H)). Recent Labs  Lab 11/03/23 1547 11/04/23 0406  AST 21 20  ALT 15 13  ALKPHOS 95 85  BILITOT 1.0 0.8  PROT 6.8 6.1*  ALBUMIN  3.1* 2.7*   No results for input(s): LIPASE, AMYLASE in the last 168 hours. No results for input(s): AMMONIA in the last 168 hours. Coagulation Profile:  Recent Labs  Lab 11/02/23 1609  INR 1.2   Unresulted  Labs (From admission, onward)     Start     Ordered   11/05/23 0500  Basic metabolic panel with GFR  Daily,   R      11/04/23 0642   11/05/23 0500  CBC  Daily,   R      11/04/23 9357           Antimicrobials/Microbiology: Anti-infectives (From admission, onward)    Start     Dose/Rate Route Frequency Ordered Stop   11/06/23 1530  vancomycin  (VANCOREADY) IVPB 750 mg/150 mL        750 mg 150 mL/hr over 60 Minutes Intravenous  Once 11/06/23 1436 11/06/23 1900   11/06/23 0800  ceFAZolin (ANCEF) IVPB 2g/100 mL premix        2 g 200 mL/hr over 30 Minutes Intravenous On call to O.R. 11/06/23 0225 11/06/23 1151   11/06/23 0800  vancomycin  (VANCOCIN ) IVPB 1000 mg/200 mL premix  Status:  Discontinued        1,000 mg 200 mL/hr over 60 Minutes Intravenous On call to O.R. 11/06/23 0225 11/06/23 1345         Component Value Date/Time   SDES BLOOD RIGHT ARM 03/09/2023 2106   SPECREQUEST  03/09/2023 2106    BOTTLES DRAWN AEROBIC ONLY Blood Culture results may not be optimal due to an inadequate volume of blood received in culture bottles   CULT  03/09/2023 2106    NO GROWTH 5 DAYS Performed at [facility], [address] [place], [place] [address]    REPTSTATUS 03/14/2023 FINAL 03/09/2023 2106    Procedures: Procedure(s) (LRB): IRRIGATION AND DEBRIDEMENT LEFT LEG (Left) APPLICATION, WOUND  VAC (Left)   Mennie LAMY, MD [facility] 11/08/2023, 11:43 AM

## 2023-11-08 NOTE — Progress Notes (Signed)
 Mobility Specialist Progress Note;   11/08/23 0953  Mobility  Activity Pivoted/transferred to/from [facility]  Level of Assistance Minimal assist, patient does 75% or more  Assistive Device Other (Comment) (HHA)  Distance Ambulated (ft) 3 ft  Activity Response Tolerated fair  Mobility Referral Yes  Mobility visit 1 Mobility  Mobility Specialist Start Time (ACUTE ONLY) [id number]  Mobility Specialist Stop Time (ACUTE ONLY) 1006  Mobility Specialist Time Calculation (min) (ACUTE ONLY) 13 min   Pt agreeable to mobility. Requested assistance to [facility] before anything else. Required MinA to safely transfer pt to [facility]. BM successful, assisted pt w/ pericare. Pt requested to be left on [facility] and will call when finished. Caregiver at bedside.   Lauraine Erm Mobility Specialist Please contact via SecureChat or [employer] [phone]

## 2023-11-08 NOTE — Progress Notes (Signed)
 Mobility Specialist Progress Note;   11/08/23 1024  Mobility  Activity Pivoted/transferred to/from [facility];Pivoted/transferred from bed to chair  Level of Assistance Minimal assist, patient does 75% or more  Assistive Device Other (Comment) (HHA)  Distance Ambulated (ft) 5 ft  Activity Response Tolerated fair  Mobility Referral Yes  Mobility visit 1 Mobility  Mobility Specialist Start Time (ACUTE ONLY) 1024  Mobility Specialist Stop Time (ACUTE ONLY) 1034  Mobility Specialist Time Calculation (min) (ACUTE ONLY) 10 min   Answered pts call light, finished on BSC. Assisted caregiver w/ pericare. Required MinA+2 and heavy VC to direct pt while transferring to chair as pt lacked safety awareness. Pt left with all needs met and LLE elevated in chair, alarm on. Caregiver in room and RN notified.   Lauraine Erm Mobility Specialist Please contact via SecureChat or [employer] [phone]

## 2023-11-09 ENCOUNTER — Other Ambulatory Visit (HOSPITAL_COMMUNITY): Payer: Self-pay

## 2023-11-09 DIAGNOSIS — S8012XA Contusion of left lower leg, initial encounter: Secondary | ICD-10-CM | POA: Diagnosis not present

## 2023-11-09 LAB — CBC
HCT: 30.1 % — ABNORMAL LOW (ref 36.0–46.0)
Hemoglobin: 9.4 g/dL — ABNORMAL LOW (ref 12.0–15.0)
MCH: 30.9 pg (ref 26.0–34.0)
MCHC: 31.2 g/dL (ref 30.0–36.0)
MCV: 99 fL (ref 80.0–100.0)
Platelets: 183 K/uL (ref 150–400)
RBC: 3.04 MIL/uL — ABNORMAL LOW (ref 3.87–5.11)
RDW: 20.5 % — ABNORMAL HIGH (ref 11.5–15.5)
WBC: 6.3 K/uL (ref 4.0–10.5)
nRBC: 0 % (ref 0.0–0.2)

## 2023-11-09 LAB — BASIC METABOLIC PANEL WITH GFR
Anion gap: 10 (ref 5–15)
BUN: 44 mg/dL — ABNORMAL HIGH (ref 8–23)
CO2: 23 mmol/L (ref 22–32)
Calcium: 8 mg/dL — ABNORMAL LOW (ref 8.9–10.3)
Chloride: 105 mmol/L (ref 98–111)
Creatinine, Ser: 1.85 mg/dL — ABNORMAL HIGH (ref 0.44–1.00)
GFR, Estimated: 24 mL/min — ABNORMAL LOW (ref >60)
Glucose, Bld: 86 mg/dL (ref 70–99)
Potassium: 4.6 mmol/L (ref 3.5–5.1)
Sodium: 138 mmol/L (ref 135–145)

## 2023-11-09 MED ORDER — APIXABAN 2.5 MG PO TABS
2.5000 mg | ORAL_TABLET | Freq: Two times a day (BID) | ORAL | Status: DC
  Administered 2023-11-09: 2.5 mg via ORAL
  Filled 2023-11-09: qty 1

## 2023-11-09 MED ORDER — FUROSEMIDE 40 MG PO TABS
20.0000 mg | ORAL_TABLET | ORAL | Status: AC
Start: 2023-11-11 — End: 2023-12-11

## 2023-11-09 MED ORDER — OXYCODONE-ACETAMINOPHEN 5-325 MG PO TABS
1.0000 | ORAL_TABLET | Freq: Two times a day (BID) | ORAL | 0 refills | Status: AC | PRN
  Filled 2023-11-09: qty 10, 5d supply, fill #0

## 2023-11-09 NOTE — Progress Notes (Signed)
 Physical Therapy Treatment Patient Details Name: Allison Norman MRN: [id number] DOB: [birth year] Today's Date: 11/09/2023   History of Present Illness The pt is a 89 yo female presenting 10/27 with hematoma to L shin. I and D completed 10/31. PMH includes: afib on Eliquis , CHF, CKD, breast cancer, chronic resp failure on 2L, pacemaker.    PT Comments  The pt was agreeable to session, able to make progress with total activity tolerance in session and demos good tolerance of pain in LLE. The pt did require minA at times to steady with sit-stand transfers, turning, and with standing balance tasks. Pt with generally poor endurance, benefiting from seated rest  intermittently. Will continue to follow and progress as able, recommendations remain appropriate.      If plan is discharge home, recommend the following: A little help with walking and/or transfers;A little help with bathing/dressing/bathroom;Assistance with cooking/housework;Assist for transportation;Help with stairs or ramp for entrance   Can travel by private vehicle        Equipment Recommendations  None recommended by PT    Recommendations for Other Services       Precautions / Restrictions Precautions Precautions: Fall Recall of Precautions/Restrictions: Intact Precaution/Restrictions Comments: L shin VAC dressing Restrictions Weight Bearing Restrictions Per Provider Order: No Other Position/Activity Restrictions: WBAT     Mobility  Bed Mobility Overal bed mobility: Needs Assistance Bed Mobility: Supine to Sit     Supine to sit: HOB elevated, Supervision     General bed mobility comments: slightly increased time and line management    Transfers Overall transfer level: Needs assistance Equipment used: Rolling walker (2 wheels) Transfers: Sit to/from Stand, Bed to chair/wheelchair/BSC Sit to Stand: Min assist   Step pivot transfers: Min assist       General transfer comment: cues for hand placement, minA  to power up and to steady in standing, completed from EOB and BSC. good use of UE on supports, unable to rise without UE support    Ambulation/Gait Ambulation/Gait assistance: Min assist Gait Distance (Feet): 75 Feet Assistive device: Rolling walker (2 wheels) Gait Pattern/deviations: Step-through pattern Gait velocity: decreased Gait velocity interpretation: <1.31 ft/sec, indicative of household ambulator   General Gait Details: slow mildly unsteady and narrow gait.  But overall safe with the RW and minimal stability and /or walker maneuvering assist   Stairs             Wheelchair Mobility     Tilt Bed    Modified Rankin (Stroke Patients Only)       Balance Overall balance assessment: Needs assistance Sitting-balance support: No upper extremity supported, Feet supported Sitting balance-Leahy Scale: Good     Standing balance support: Bilateral upper extremity supported, During functional activity Standing balance-Leahy Scale: Poor Standing balance comment: reliant on the RW or external support.  mild posterior bias.                            Communication Communication Communication: No apparent difficulties Factors Affecting Communication: Hearing impaired  Cognition Arousal: Alert Behavior During Therapy: WFL for tasks assessed/performed   PT - Cognitive impairments: No apparent impairments                       PT - Cognition Comments: pt able to answer all questions approrpiately, not formally assessed Following commands: Intact      Cueing Cueing Techniques: Verbal cues  Exercises  General Comments General comments (skin integrity, edema, etc.): VSS on RA      Pertinent Vitals/Pain Pain Assessment Pain Assessment: Faces Faces Pain Scale: Hurts little more Pain Location: L LE wound, dressing in place Pain Descriptors / Indicators: Discomfort Pain Intervention(s): Monitored during session, Limited activity within  patient's tolerance, Repositioned    Home Living                          Prior Function            PT Goals (current goals can now be found in the care plan section) Acute Rehab PT Goals Patient Stated Goal: to return home PT Goal Formulation: With patient Time For Goal Achievement: 11/18/23 Potential to Achieve Goals: Good Progress towards PT goals: Progressing toward goals    Frequency    Min 2X/week       AM-PAC PT 6 Clicks Mobility   Outcome Measure  Help needed turning from your back to your side while in a flat bed without using bedrails?: A Little Help needed moving from lying on your back to sitting on the side of a flat bed without using bedrails?: A Little Help needed moving to and from a bed to a chair (including a wheelchair)?: A Lot Help needed standing up from a chair using your arms (e.g., wheelchair or bedside chair)?: A Little Help needed to walk in hospital room?: A Little Help needed climbing 3-5 steps with a railing? : A Lot 6 Click Score: 16    End of Session Equipment Utilized During Treatment: Gait belt Activity Tolerance: Patient tolerated treatment well Patient left: in chair;with call bell/phone within reach;with family/visitor present Nurse Communication: Mobility status PT Visit Diagnosis: Unsteadiness on feet (R26.81);Other abnormalities of gait and mobility (R26.89);Muscle weakness (generalized) (M62.81);History of falling (Z91.81);Pain Pain - Right/Left: Left Pain - part of body: Leg     Time: 9075-9046 PT Time Calculation (min) (ACUTE ONLY): 29 min  Charges:    $Gait Training: 8-22 mins $Therapeutic Activity: 8-22 mins PT General Charges $$ ACUTE PT VISIT: 1 Visit                     Izetta Call, PT, DPT   Acute Rehabilitation Department Office [phone] Secure Chat Communication Preferred    Izetta JULIANNA Call 11/09/2023, 11:07 AM

## 2023-11-09 NOTE — TOC Transition Note (Addendum)
 Transition of Care [facility]) - Discharge Note   Patient Details  Name: Allison Norman MRN: [id number] Date of Birth: Sep 25, 1924  Transition of Care [facility]) CM/SW Contact:  Sudie Erminio Deems, RN Phone Number: 11/09/2023, 10:56 AM   Clinical Narrative: Patient plans to transition home today. Caregiver Allison Norman is at the bedside and she will transport home via private vehicle. Patient will have HH PT/OT with [facility] and the office is aware that the patient will discharge today. No DME needs identified. Staff Rn is aware to place the Prevena Wound VAC on the patient before discharge. Patient will follow up with Dr. Elease post hospital. No further needs identified at this time.   Final next level of care: Home w Home Health Services Barriers to Discharge: No Barriers Identified   Choice offered to / list presented to :  (Patient has had [facility] in the past and wants to use them again)   Discharge Plan and Services Additional resources added to the After Visit Summary for   In-house Referral: NA Discharge Planning Services: CM Consult Post Acute Care Choice: Home Health            DME Agency: NA   HH Arranged: PT, OT HH Agency: [facility] Date [facility] Agency Contacted: 11/09/23 Time HH Agency Contacted: 1056 Representative spoke with at [facility] Agency: Darleene  Social Drivers of Health (SDOH) Interventions SDOH Screenings   Food Insecurity: No Food Insecurity (11/03/2023)  Housing: Low Risk  (11/03/2023)  Transportation Needs: No Transportation Needs (11/03/2023)  Utilities: Not At Risk (11/03/2023)  Social Connections: Socially Isolated (11/03/2023)  Tobacco Use: Medium Risk (11/02/2023)   Readmission Risk Interventions    07/31/2023   11:26 AM 07/29/2023    2:54 PM 07/29/2023   10:12 AM  Readmission Risk Prevention Plan  Transportation Screening Complete Complete   PCP or Specialist Appt within 5-7 Days Complete    Home Care Screening Complete Complete  Complete  Medication Review (RN CM) Complete Complete Complete

## 2023-11-09 NOTE — Discharge Summary (Signed)
 Physician Discharge Summary  Allison Norman FMW:[id number] DOB: [birth year] DOA: 11/02/2023  PCP: Okey Carlin Redbird, MD  Admit date: 11/02/2023 Discharge date: 11/09/2023 Recommendations for Outpatient Follow-up:  Follow up with PCP in 1 weeks-call for appointment Please obtain BMP/CBC in one week Follow with Dr Harden for wound vac care Call Dr Lowery office from plastic surgery for follow up upon discharge Check your renal function BMP in the next 3 days from PCP office to adjust  diuretics  Discharge Dispo: home w/ hh Discharge Condition: Stable Code Status:   Code Status: Limited: Do not attempt resuscitation (DNR) -DNR-LIMITED -Do Not Intubate/DNI  Diet recommendation:  Diet Order             Diet - low sodium heart healthy           Diet Heart Room service appropriate? Yes; Fluid consistency: Thin  Diet effective now                    Brief/Interim Summary: Allison Norman is a 89 y.o. female with PMH of Afib s/p DCCV in 2017 with re-occurrence in 2018, HTN, CKD3b, and breast/skin cancer, chronic resp failure on 2L [place], aortic stenosis presenting w/ pain and wound on LLE after she fell when a shower line fell out of her hand swinga and folded on her left shin. She developed intermediate large bruise over the affected area. Patient noted to have been admitted July 22 through July 30 for issues including acute on chronic heart failure to which she is placed in the hospital home program and otherwise stable course.Has a around-the-clock caregiver support at home.  in ER: afebrile, hemodynamically stable.  Satting 88% on room air.  Transitioned to 2 L nasal cannula to keep O2 sats.  96%.  White count 6.6, hemoglobin 13.1, platelets 236.  Creatinine 1.9.  Left tib-fib showing no acute fracture and soft tissue density measuring 10, anterior extremity concerning for hematoma. XRAY TIBIA/Fibula: No acute fracture. Soft tissue density measuring up to 10 cm in the anterior lower  extremity likely related to hematoma. Dr. Harden has been consulted> underwent debridement Haemate evacuation and wound VAC placement by Dr. Harden and advised weightbearing as tolerated and discharged home on Prevena wound VAC and wound care.  Patient was monitored due to her renal dysfunction creatinine now down to 1.8 which appears to be her baseline  Subjective: Seen and examined Resting comfortably caregiver at the bedside. Pain control on the left leg, eager to go home today Overnight afebrile and function improving 1.8 BP stable  Discharge Diagnoses:  Posttraumatic pretibial hematoma of left lower extremity, initial encounter LLE hematoma s/p traumatic strike with shower wand ABLA in the setting of chronic anemia due to #1: Left lower extremity x-ray stable seen by Dr Harden S/p excisional debridement hematoma left leg application of Kerecis micro graft, wound VAC Left lower extremity on wound VAC, okay for weightbearing on  LLE Plan for discharge w/ Prevena with portable wound VAC.  Resume Eliquis - discussed w/ Dr Harden  Recent Labs  Lab 11/05/23 9766 11/06/23 0422 11/07/23 0430 11/08/23 0520 11/09/23 0447  HGB 10.6* 9.9* 10.2* 10.6* 9.4*  HCT 33.7* 31.9* 32.1* 34.4* 30.1*     AKI  vs progressive CKD  stage IV: baseline scr 1.7-2.0,-when looking blood work back in July 25. Lasix  held 10/30, creatinine has been fluctuating-1.9-2.1 range-no significant changes despite IV fluids.no episode of hypotension no nephrotoxic meds use.I suspect this is progressive CKD close to baseline  with some component of AKI .Creatinine improved 1.8 suspect is at baseline.  Advise outpatient follow-up. Resume her diuretics I on 11/5 Recent Labs    08/04/23 1018 08/10/23 1313 11/02/23 1609 11/03/23 1547 11/04/23 0406 11/05/23 0233 11/06/23 0422 11/07/23 0430 11/08/23 0520 11/09/23 0447  BUN 32* 32 44* 40* 39* 44* 46* 45* 51* 44*  CREATININE 1.75* 1.60* 1.90* 1.71* 1.78* 2.39* 2.05* 1.97* 2.19*  1.85*  CO2 32 27 27 26 24 26 23 22 23  23  K 3.9 4.8 4.6 5.0 4.7 4.8 4.5 4.9 4.4 4.6     PAF: Baseline atrial fibrillation on eliquis  and amiodarone  . Hed eliquis  in setting of large LLE traumatic hematoma> okay with Dr. Harden to  resume anticoagulation   Chronic respiratory failure with hypoxia: On 2L [place] chronically , at baseline Monitor    Chronic diastolic CHF 2D ECHO 03/2022 w/ EF 65-70% and grade 2 diastolic dysfunction.  Holding Lasix  due to AKI  Severe malnutrition w/ Body mass index is 17.21 kg/m.: RD consulted  Mobility: PT Orders: Active  PT Follow up Rec: Home Health Pt11/01/2023 1646   DVT prophylaxis:  Code Status:   Code Status: Limited: Do not attempt resuscitation (DNR) -DNR-LIMITED -Do Not Intubate/DNI  Family Communication: plan of care discussed with patient/caregiver at bedside . Patient status is: Remains hospitalized because of severity of illness Level of care: Telemetry   Dispo: The patient is from: home            Anticipated disposition:  home today Objective: Vitals last 24 hrs: Vitals:   11/08/23 2100 11/08/23 2307 11/09/23 0456 11/09/23 0700  BP: 117/80 111/72 (!) 116/59 (!) 114/53  Pulse:  (!) 59 60   Resp:  (!) 22 20 18   Temp:  98.5 F (36.9 C) 98.4 F (36.9 C) 98.4 F (36.9 C)  TempSrc:  Oral Oral Oral  SpO2:  95% 98%   Weight:      Height:       Physical Examination: General exam: AAOX3, thin HEENT:Oral mucosa moist, Ear/Nose WNL grossly Respiratory system: Bilaterally clear BS,no use of accessory muscle Cardiovascular system: S1 & S2 +, No JVD. Gastrointestinal system: Abdomen soft,NT,ND, BS+ Nervous System: Alert, awake non focal Extremities: extremities warm, LLE surgical site/wound site with wound VAC in place  Skin: Warm, no rashes MSK: Normal muscle bulk,tone, power     Pressure Ulcer: Wound 11/03/23 1252 Pressure Injury Sacrum Medial Deep Tissue Pressure Injury - Purple or maroon localized area of discolored intact skin or  blood-filled blister due to damage of underlying soft tissue from pressure and/or shear. (Active)   Procedure(s) (LRB): IRRIGATION AND DEBRIDEMENT LEFT LEG (Left) APPLICATION, WOUND VAC (Left)  Consultation: See note.  Discharge Instructions  Discharge Instructions     (HEART FAILURE PATIENTS) Call MD:  Anytime you have any of the following symptoms: 1) 3 pound weight gain in 24 hours or 5 pounds in 1 week 2) shortness of breath, with or without a dry hacking cough 3) swelling in the hands, feet or stomach 4) if you have to sleep on extra pillows at night in order to breathe.   Complete by: As directed    Ambulatory referral to Plastic Surgery   Complete by: As directed    Diet - low sodium heart healthy   Complete by: As directed    Discharge instructions   Complete by: As directed    Follow with Dr Harden for wound vac care Call Dr Lowery office from plastic surgery for  follow up upon discharge Check your renal function BMP in the next 3 days from PCP office to adjust  diuretics  Please call call MD or return to ER for similar or worsening recurring problem that brought you to hospital or if any fever,nausea/vomiting,abdominal pain, uncontrolled pain, chest pain,  shortness of breath or any other alarming symptoms.  Please follow-up your doctor as instructed in a week time and call the office for appointment.  Please avoid alcohol, smoking, or any other illicit substance and maintain healthy habits including taking your regular medications as prescribed.  You were cared for by a hospitalist during your hospital stay. If you have any questions about your discharge medications or the care you received while you were in the hospital after you are discharged, you can call the unit and ask to speak with the hospitalist on call if the hospitalist that took care of you is not available.  Once you are discharged, your primary care physician will handle any further medical issues. Please  note that NO REFILLS for any discharge medications will be authorized once you are discharged, as it is imperative that you return to your primary care physician (or establish a relationship with a primary care physician if you do not have one) for your aftercare needs so that they can reassess your need for medications and monitor your lab values   Discharge wound care:   Complete by: As directed    Attach the wound VAC dressing to the Prevena plus portable wound VAC pump at time of discharge.  Show patient and caregivers how to plug this and to keep it charged.   Increase activity slowly   Complete by: As directed    Negative Pressure Wound Therapy - Incisional   Complete by: As directed    Attach the wound VAC dressing to the Prevena plus portable wound VAC pump at time of discharge.  Show patient and caregivers how to plug this and to keep it charged.      Allergies as of 11/09/2023       Reactions   Codeine Itching   Penicillins Itching, Rash   Apresoline  [hydralazine ] Other (See Comments)   Felt bad Could hear/feel pulse in head Tinnitus    Cardura  [doxazosin ] Other (See Comments)   Congestion Wheezing Heavy feeling in chest   Vibramycin [doxycycline] Other (See Comments)   Chest tightness         Medication List     TAKE these medications    amiodarone  200 MG tablet Commonly known as: PACERONE  Take 1/2 (one-half) tablet by mouth once daily   diazepam  2 MG tablet Commonly known as: VALIUM  Take 2 mg by mouth See admin instructions. Take 1 tablet (2mg ) by mouth every evening. May also take 1 tablet during the day if needed for anxiety.   Eliquis  2.5 MG Tabs tablet Generic drug: apixaban  Take 1 tablet by mouth twice daily   Ferrex 150 150 MG capsule Generic drug: iron  polysaccharides Take 150 mg by mouth 2 (two) times daily.   furosemide  40 MG tablet Commonly known as: Lasix  Take 0.5-1.5 tablets (20-60 mg total) by mouth See admin instructions. Take 1.5  tablets (60mg ) by mouth every morning and take 1/2 tablet (20mg ) every eveing. Start taking on: November 11, 2023 What changed:  how much to take when to take this additional instructions These instructions start on November 11, 2023. If you are unsure what to do until then, ask your doctor or other care provider.  metoprolol  tartrate 25 MG tablet Commonly known as: LOPRESSOR  Take 0.5 tablets (12.5 mg total) by mouth 2 (two) times daily.   PEPCID  AC PO Take 1 tablet by mouth every evening.   potassium chloride  SA 20 MEQ tablet Commonly known as: KLOR-CON  M Take 1 tablet (20 mEq total) by mouth daily.               Discharge Care Instructions  (From admission, onward)           Start     Ordered   11/09/23 0000  Discharge wound care:       Comments: Attach the wound VAC dressing to the Prevena plus portable wound VAC pump at time of discharge.  Show patient and caregivers how to plug this and to keep it charged.   11/09/23 1007            Contact information for follow-up providers     Dillingham, Estefana RAMAN, DO. Schedule an appointment as soon as possible for a visit .   Specialty: Plastic Surgery Contact information: [address] [place] [address] [phone]         Care, [facility] Follow up.   Specialty: Home Health Services Why: Physical and Occupational Therapy-office to call with visit times. Contact information: [address] [place] [address] [phone]         Harden Jerona GAILS, MD Follow up in 1 week(s).   Specialty: Orthopedic Surgery Contact information: [address] [place]  [place] [address] [phone]              Contact information for after-discharge care     Home Medical Care     [facility]) .   Service: Home Health Services Contact information: [address] [place]  [address] [phone]                     Allergies  Allergen Reactions   Codeine Itching   Penicillins Itching and Rash   Apresoline  [Hydralazine ] Other (See Comments)    Felt bad Could hear/feel pulse in head Tinnitus    Cardura  [Doxazosin ] Other (See Comments)    Congestion Wheezing Heavy feeling in chest   Vibramycin [Doxycycline] Other (See Comments)    Chest tightness     The results of significant diagnostics from this hospitalization (including imaging, microbiology, ancillary and laboratory) are listed below for reference.    Microbiology: Recent Results (from the past 240 hours)  Surgical pcr screen     Status: Abnormal   Collection Time: 11/04/23 11:51 AM   Specimen: Nasal Mucosa; Nasal Swab  Result Value Ref Range Status   MRSA, PCR POSITIVE (A) NEGATIVE Final    Comment: RESULT CALLED TO, READ BACK BY AND VERIFIED WITH: RN S. BARNHILL [phone] @ 1540 FH    Staphylococcus aureus POSITIVE (A) NEGATIVE Final    Comment: (NOTE) The Xpert SA Assay (FDA approved for NASAL specimens in patients 25 years of age and older), is one component of a comprehensive surveillance program. It is not intended to diagnose infection nor to guide or monitor treatment. Performed at [facility], [address], [place], [place] [address]     Procedures/Studies: DG Tibia/Fibula Left Result Date: 11/02/2023 EXAM: 1 VIEW XRAY OF THE LEFT TIBIA AND FIBULA 11/02/2023 05:30:00 PM COMPARISON: None available. CLINICAL HISTORY: Shin injury. Patient brought in by [facility] for evaluation of large hematoma to  left shin. Patient advises she was getting out of the shower, showerhead hit left shin. Patient reports pain to same, with palpation. FINDINGS: BONES AND JOINTS: Mild degenerative changes of the knee. No acute fracture. No focal osseous lesion. No joint dislocation. SOFT TISSUES: An area of soft tissue density measuring up to 10 cm is seen in the anterior lower extremity, likely related to hematoma. IMPRESSION: 1. No acute  fracture. 2. Soft tissue density measuring up to 10 cm in the anterior lower extremity, likely related to hematoma. Electronically signed by: Greig Pique MD 11/02/2023 06:03 PM EDT RP Workstation: [id number]   CUP PACEART REMOTE DEVICE CHECK Result Date: 10/20/2023 PPM Scheduled remote reviewed. Normal device function.  Presenting rhythm: AP/VS Hx of PAF, controlled rates, burden 43%, Eliquis  per EPIC Next remote transmission per protocol. LA, CVRS   Labs: BNP (last 3 results) Recent Labs    06/22/23 1333 07/28/23 1402 08/03/23 1013  BNP 954.9* 822.1* 992.9*   Basic Metabolic Panel: Recent Labs  Lab 11/05/23 0233 11/06/23 0422 11/07/23 0430 11/08/23 0520 11/09/23 0447  NA 139 136 136 137 138  K 4.8 4.5 4.9 4.4 4.6  CL 103 103 103 104 105  CO2 26 23 22 23 23   GLUCOSE 97 105* 135* 91 86  BUN 44* 46* 45* 51* 44*  CREATININE 2.39* 2.05* 1.97* 2.19* 1.85*  CALCIUM  8.3* 7.8* 7.6* 7.9* 8.0*   Liver Function Tests: Recent Labs  Lab 11/03/23 1547 11/04/23 0406  AST 21 20  ALT 15 13  ALKPHOS 95 85  BILITOT 1.0 0.8  PROT 6.8 6.1*  ALBUMIN  3.1* 2.7*   No results for input(s): LIPASE, AMYLASE in the last 168 hours. No results for input(s): AMMONIA in the last 168 hours. CBC: Recent Labs  Lab 11/02/23 1609 11/03/23 1547 11/05/23 0233 11/06/23 0422 11/07/23 0430 11/08/23 0520 11/09/23 0447  WBC 6.6  --  6.8 6.3 5.9 7.7 6.3  NEUTROABS 3.7  --   --   --   --   --   --   HGB 13.1   < > 10.6* 9.9* 10.2* 10.6* 9.4*  HCT 41.6   < > 33.7* 31.9* 32.1* 34.4* 30.1*  MCV 93.5  --  95.2 97.0 95.3 98.0 99.0  PLT 236  --  196 185 199 217 183   < > = values in this interval not displayed.   CBG: No results for input(s): GLUCAP in the last 168 hours. Hgb A1c No results for input(s): HGBA1C in the last 72 hours. Anemia work up No results for input(s): VITAMINB12, FOLATE, FERRITIN, TIBC, IRON , RETICCTPCT in the last 72 hours.  Cardiac Enzymes: No results  for input(s): CKTOTAL, CKMB, CKMBINDEX, TROPONINI in the last 168 hours. BNP: Invalid input(s): POCBNP D-Dimer No results for input(s): DDIMER in the last 72 hours. Lipid Profile No results for input(s): CHOL, HDL, LDLCALC, TRIG, CHOLHDL, LDLDIRECT in the last 72 hours. Thyroid  function studies No results for input(s): TSH, T4TOTAL, T3FREE, THYROIDAB in the last 72 hours.  Invalid input(s): FREET3 Urinalysis    Component Value Date/Time   COLORURINE YELLOW 03/25/2021 0551   APPEARANCEUR CLEAR 03/25/2021 0551   LABSPEC 1.015 03/25/2021 0551   PHURINE 6.0 03/25/2021 0551   GLUCOSEU NEGATIVE 03/25/2021 0551   HGBUR SMALL (A) 03/25/2021 0551   BILIRUBINUR NEGATIVE 03/25/2021 0551   KETONESUR NEGATIVE 03/25/2021 0551   PROTEINUR TRACE (A) 03/25/2021 0551   UROBILINOGEN 0.2 08/14/2010 1245   NITRITE NEGATIVE 03/25/2021 0551   LEUKOCYTESUR NEGATIVE 03/25/2021 0551  Sepsis Labs Recent Labs  Lab 11/06/23 0422 11/07/23 0430 11/08/23 0520 11/09/23 0447  WBC 6.3 5.9 7.7 6.3   Microbiology Recent Results (from the past 240 hours)  Surgical pcr screen     Status: Abnormal   Collection Time: 11/04/23 11:51 AM   Specimen: Nasal Mucosa; Nasal Swab  Result Value Ref Range Status   MRSA, PCR POSITIVE (A) NEGATIVE Final    Comment: RESULT CALLED TO, READ BACK BY AND VERIFIED WITH: RN S. BARNHILL [phone] @ 1540 FH    Staphylococcus aureus POSITIVE (A) NEGATIVE Final    Comment: (NOTE) The Xpert SA Assay (FDA approved for NASAL specimens in patients 65 years of age and older), is one component of a comprehensive surveillance program. It is not intended to diagnose infection nor to guide or monitor treatment. Performed at [facility], [address], [place], [place] [address]      Time coordinating discharge: 35 minutes  SIGNED: Mennie LAMY, MD  [facility] 11/09/2023, 10:08 AM  If 7PM-7AM, please contact  night-coverage [email or web address]

## 2023-11-11 ENCOUNTER — Telehealth: Payer: Self-pay

## 2023-11-11 NOTE — Telephone Encounter (Signed)
 Lorrene PT, with [facility] called concerning patients wound vac.  Stated that patient will not let [facility] come into the home and would like know what can be done.  Stated he would try to talk to patient,but would like to know if someone could give her a call.  CB# for Lorrene [phone].  CB# for patient is [phone].  Please advise.

## 2023-11-12 NOTE — Telephone Encounter (Signed)
 I had called and sw the pt yesterday evening. Made an appt for post op follow up next week. Pt states that she has a nurse coming to the home today. Pt states that she did not stop anyone from coming in her home. There was some confusion about the appt I was trying to make for post op care the pt thought that I was calling from the PCP office.

## 2023-11-13 ENCOUNTER — Telehealth: Payer: Self-pay | Admitting: Physician Assistant

## 2023-11-13 NOTE — Telephone Encounter (Signed)
 Spoke with pt, she wanted to make sure renee knew she cancelled her appointment because she is not able to get around good right now. She will try to make another appointment in about 1 month. Will make renee aware.

## 2023-11-13 NOTE — Telephone Encounter (Signed)
 Pt called to cancel appt and would like a nurse to c/b please advise

## 2023-11-16 ENCOUNTER — Ambulatory Visit: Admitting: Physician Assistant

## 2023-11-16 ENCOUNTER — Ambulatory Visit (HOSPITAL_BASED_OUTPATIENT_CLINIC_OR_DEPARTMENT_OTHER): Admitting: Nurse Practitioner

## 2023-11-16 ENCOUNTER — Encounter: Payer: Self-pay | Admitting: Physician Assistant

## 2023-11-16 DIAGNOSIS — Z9889 Other specified postprocedural states: Secondary | ICD-10-CM

## 2023-11-16 DIAGNOSIS — T148XXA Other injury of unspecified body region, initial encounter: Secondary | ICD-10-CM

## 2023-11-16 NOTE — Progress Notes (Signed)
 Office Visit Note   Patient: Allison Norman           Date of Birth: 01/21/1924           MRN: [id number] Visit Date: 11/16/2023              Requested by: Okey Carlin Redbird, MD [address] [place],  [place] [address] PCP: Okey Carlin Redbird, MD  Chief Complaint  Patient presents with   Left Leg - Routine Post Op    11/06/2023 I&D LLE      HPI: Allison Norman is a  89 y.o. female with a diagnosis of Hematoma Left Leg who failed conservative measures and elected for surgical management.  She   is s/p Excisional debridement hematoma left leg and Application Kerecis.    Assessment & Plan: Visit Diagnoses:  1. Traumatic hematoma   2. S/P evacuation of hematoma     Plan: We will place adaptic over the wound bed, dry guaze and ace wrap for mild compression.  Keep the dressing clean and dry.  Elevate when at rest.  She was given a hand out to demonstrate proper elevation above the heart. WBAT  Follow-Up Instructions: Return in about 1 week (around 11/23/2023).   Ortho Exam  Patient is alert, oriented, no adenopathy, well-dressed, normal affect, normal respiratory effort. 11.3 cm x 6.7 cm.  Beefy red base with visible Kerecis over the wound bed.  She has doppler signals in AT/DP/PT. Minimal pedal edema in the left ankle and foot.  No cellulitis or active drainage.      Imaging: No results found. No images are attached to the encounter.  Labs: Lab Results  Component Value Date   ESRSEDRATE 55 (H) 08/14/2010   ESRSEDRATE 32 (H) 01/21/2010   ESRSEDRATE 123 (H) 09/27/2009   CRP 0.7 (H) 08/14/2010   CRP 0.5 01/21/2010   CRP 24.3 (H) 09/27/2009   REPTSTATUS 03/14/2023 FINAL 03/09/2023   GRAMSTAIN  03/25/2021    FEW WBC PRESENT, PREDOMINANTLY PMN FEW GRAM POSITIVE COCCI IN PAIRS IN CLUSTERS Performed at [facility] Lab, [address], [place], [place] [address]    CULT  03/09/2023    NO GROWTH 5 DAYS Performed at [facility], [address],  [place], [place] [address]    LABORGA METHICILLIN RESISTANT STAPHYLOCOCCUS AUREUS 03/25/2021     Lab Results  Component Value Date   ALBUMIN  2.7 (L) 11/04/2023   ALBUMIN  3.1 (L) 11/03/2023   ALBUMIN  2.7 (L) 07/28/2023   PREALBUMIN 10.3 (L) 03/25/2021    Lab Results  Component Value Date   MG 2.3 08/04/2023   MG 2.4 03/25/2021   MG 2.3 03/24/2021   No results found for: [facility]  Lab Results  Component Value Date   PREALBUMIN 10.3 (L) 03/25/2021      Latest Ref Rng & Units 11/09/2023    4:47 AM 11/08/2023    5:20 AM 11/07/2023    4:30 AM  CBC EXTENDED  WBC 4.0 - 10.5 K/uL 6.3  7.7  5.9   RBC 3.87 - 5.11 MIL/uL 3.04  3.51  3.37   Hemoglobin 12.0 - 15.0 g/dL 9.4  89.3  89.7   HCT 63.9 - 46.0 % 30.1  34.4  32.1   Platelets 150 - 400 K/uL 183  217  199      There is no height or weight on file to calculate BMI.  Orders:  No orders of the defined types were placed in  this encounter.  No orders of the defined types were placed in this encounter.    Procedures: No procedures performed  Clinical Data: No additional findings.  ROS:  All other systems negative, except as noted in the HPI. Review of Systems  Objective: Vital Signs: There were no vitals taken for this visit.  Specialty Comments:  No specialty comments available.  PMFS History: Patient Active Problem List   Diagnosis Date Noted   Hematoma of left lower extremity 11/04/2023   Chronic respiratory failure with hypoxia (HCC) 11/03/2023   Posttraumatic pretibial hematoma of left lower extremity, initial encounter 11/02/2023   Edema due to hypoalbuminemia 08/05/2023   Acute on chronic diastolic CHF (congestive heart failure) (HCC) 07/28/2023   Aortic stenosis 08/20/2021   Blepharitis of lower eyelids of both eyes 07/23/2021   Protein-calorie malnutrition, severe 03/27/2021   Acute on chronic respiratory failure with hypoxia (HCC) - chronically on 2 L/min 03/24/2021   History of pulmonary embolus (PE)  03/24/2021   Pulmonary nodule 03/24/2021   Chronic diastolic CHF (congestive heart failure) (HCC) 03/24/2021   Pulmonary hypertension, unspecified (HCC) 02/14/2021   Vitreomacular traction syndrome of right eye 01/28/2021   Iron  deficiency anemia 09/07/2020   CKD (chronic kidney disease), stage IV (HCC) - baseline scr 1.7-2.0    Advanced nonexudative age-related macular degeneration of right eye without subfoveal involvement 03/15/2020   Pacemaker 11/01/2019   Tachycardia-bradycardia syndrome (HCC) 08/05/2019   Bradycardia 07/05/2019   Meibomian blepharitis, left 06/28/2019   Exudative age-related macular degeneration of right eye with active choroidal neovascularization (HCC) 05/03/2019   Cystoid macular edema of right eye 05/03/2019   Advanced nonexudative age-related macular degeneration of left eye with subfoveal involvement 05/03/2019   Fusion beats 08/03/2017   Atypical atrial flutter (HCC)    Persistent atrial fibrillation (HCC) 08/19/2016   On anticoagulant therapy 07/10/2016   PAF (paroxysmal atrial fibrillation) (HCC)    Essential hypertension 07/16/2009   TEMPORAL ARTERITIS 07/16/2009   Polymyalgia rheumatica 07/16/2009   PYOGENIC ARTHRITIS, LOWER LEG 07/02/2009   Past Medical History:  Diagnosis Date   Acute blood loss anemia 08/27/2020   Acute hypoxemic respiratory failure (HCC) 03/24/2021   Acute urinary retention 09/07/2020   Aortic stenosis 08/20/2021   Mild-moderate.  Repeat echo in one year.   Atrial fibrillation (HCC) 2017   a. s/p DCCV in 10/2015  b. recurrent in 08/2016 --> rate-control pursued.    Atrial fibrillation with RVR (HCC) 08/31/2015   Cancer (HCC)    Breast   CAP (community acquired pneumonia) 03/24/2021   CKD (chronic kidney disease)    GCA (giant cell arteritis) (HCC)    Hordeolum internum left lower eyelid 06/28/2019   Hypertension    Laceration of right lower extremity 08/28/2020   Macular degeneration    Methicillin resistant  Staphylococcus aureus infection 07/02/2009   Annotation: septic right knee  Qualifier: Diagnosis of   By: Lutricia OBIE Aquas      IMO SNOMED Dx Update Oct 2024     Osteoporosis    PMR (polymyalgia rheumatica)    Pulmonary hypertension, unspecified (HCC) 02/14/2021   Resistant hypertension 03/15/2019   Shortness of breath 03/15/2019   Skin cancer     Family History  Problem Relation Age of Onset   Stroke Mother    Kidney failure Father        died at 60   Heart disease Sister        died at 64   Heart disease Sister  died at 14   Breast cancer Daughter     Past Surgical History:  Procedure Laterality Date   APPLICATION OF WOUND VAC Left 11/06/2023   Procedure: APPLICATION, WOUND VAC;  Surgeon: Harden Jerona GAILS, MD;  Location: [facility] OR;  Service: Orthopedics;  Laterality: Left;   CARDIOVERSION N/A 10/18/2015   Procedure: CARDIOVERSION;  Surgeon: Oneil JAYSON Parchment, MD;  Location: [facility] ENDOSCOPY;  Service: Cardiovascular;  Laterality: N/A;   CARDIOVERSION N/A 02/20/2017   Procedure: CARDIOVERSION;  Surgeon: Mona Vinie JAYSON, MD;  Location: [facility] ENDOSCOPY;  Service: Cardiovascular;  Laterality: N/A;   CARDIOVERSION N/A 06/18/2017   Procedure: CARDIOVERSION;  Surgeon: Francyne Headland, MD;  Location: [facility] ENDOSCOPY;  Service: Cardiovascular;  Laterality: N/A;   CARDIOVERSION N/A 12/21/2018   Procedure: CARDIOVERSION;  Surgeon: Alveta Aleene PARAS, MD;  Location: [facility] ENDOSCOPY;  Service: Cardiovascular;  Laterality: N/A;   INCISION AND DRAINAGE OF DEEP ABSCESS, CALF Left 11/06/2023   Procedure: IRRIGATION AND DEBRIDEMENT LEFT LEG;  Surgeon: Harden Jerona GAILS, MD;  Location: [facility] OR;  Service: Orthopedics;  Laterality: Left;  LEFT LEG DEBRIDEMENT   IR CATHETER TUBE CHANGE  02/28/2021   KNEE SURGERY  2013   MASTECTOMY  1998   left side   PACEMAKER IMPLANT N/A 07/22/2019   Procedure: PACEMAKER IMPLANT;  Surgeon: Waddell Danelle ORN, MD;  Location: [facility] INVASIVE CV LAB;  Service: Cardiovascular;  Laterality: N/A;    Social History   Occupational History   Not on file  Tobacco Use   Smoking status: Former    Current packs/day: 0.00    Types: Cigarettes    Quit date: 08/31/1966    Years since quitting: 57.2   Smokeless tobacco: Never  Vaping Use   Vaping status: Never Used  Substance and Sexual Activity   Alcohol use: No   Drug use: No   Sexual activity: Not on file

## 2023-11-18 ENCOUNTER — Ambulatory Visit: Admitting: Physician Assistant

## 2023-11-19 ENCOUNTER — Encounter: Admitting: Orthopedic Surgery

## 2023-11-24 ENCOUNTER — Encounter: Payer: Self-pay | Admitting: Physician Assistant

## 2023-11-24 ENCOUNTER — Ambulatory Visit: Admitting: Physician Assistant

## 2023-11-24 DIAGNOSIS — Z9889 Other specified postprocedural states: Secondary | ICD-10-CM

## 2023-11-24 NOTE — Progress Notes (Signed)
 Office Visit Note   Patient: Allison Norman           Date of Birth: Mar 06, 1924           MRN: [id number] Visit Date: 11/24/2023              Requested by: Okey Carlin Redbird, MD [address] [place],  [place] [address] PCP: Okey Carlin Redbird, MD  Chief Complaint  Patient presents with   Left Leg - Routine Post Op    11/06/2023 I&D LLE      HPI: Allison Norman is a  89 y.o. female with a diagnosis of Hematoma Left Leg who failed conservative measures and elected for surgical management.  She   is s/p Excisional debridement hematoma left leg and Application Kerecis.    She is here for follow up visit.  On her first post op visit she still had visible Kerecis over the wound bed.  We dressed it with adaptic and dry guaze with ace wrap.    Assessment & Plan: Visit Diagnoses:  1. S/P evacuation of hematoma     Plan: Vashe wet to dry dressing daily ace wrap compression.  Elevate PRN to decrease edema.  We will try to get insurance approval for in office Kerecis approval.     Follow-Up Instructions: Return in about 6 days (around 11/30/2023).   Ortho Exam  Patient is alert, oriented, no adenopathy, well-dressed, normal affect, normal respiratory effort. On exam she has good granulation tissue.  Yellow fibrinous tissue was removed with 4 x 4.  100 % granulation tissue.  The wound measures 11.3 x 8.5 cm.   She has doppler signals in AT/DP/PT. Minimal pedal edema in the left ankle and foot. No cellulitis or active drainage.    Imaging: No results found. No images are attached to the encounter.  Labs: Lab Results  Component Value Date   ESRSEDRATE 55 (H) 08/14/2010   ESRSEDRATE 32 (H) 01/21/2010   ESRSEDRATE 123 (H) 09/27/2009   CRP 0.7 (H) 08/14/2010   CRP 0.5 01/21/2010   CRP 24.3 (H) 09/27/2009   REPTSTATUS 03/14/2023 FINAL 03/09/2023   GRAMSTAIN  03/25/2021    FEW WBC PRESENT, PREDOMINANTLY PMN FEW GRAM POSITIVE COCCI IN PAIRS IN CLUSTERS Performed at [facility] Lab, [address] [place], [place] [address]    CULT  03/09/2023    NO GROWTH 5 DAYS Performed at [facility] Lab, [address] [place], [place] [address]    LABORGA METHICILLIN RESISTANT STAPHYLOCOCCUS AUREUS 03/25/2021     Lab Results  Component Value Date   ALBUMIN  2.7 (L) 11/04/2023   ALBUMIN  3.1 (L) 11/03/2023   ALBUMIN  2.7 (L) 07/28/2023   PREALBUMIN 10.3 (L) 03/25/2021    Lab Results  Component Value Date   MG 2.3 08/04/2023   MG 2.4 03/25/2021   MG 2.3 03/24/2021   No results found for: [facility]  Lab Results  Component Value Date   PREALBUMIN 10.3 (L) 03/25/2021      Latest Ref Rng & Units 11/09/2023    4:47 AM 11/08/2023    5:20 AM 11/07/2023    4:30 AM  CBC EXTENDED  WBC 4.0 - 10.5 K/uL 6.3  7.7  5.9   RBC 3.87 - 5.11 MIL/uL 3.04  3.51  3.37   Hemoglobin 12.0 - 15.0 g/dL 9.4  89.3  89.7   HCT 63.9 - 46.0 % 30.1  34.4  32.1   Platelets 150 - 400 K/uL  183  217  199      There is no height or weight on file to calculate BMI.  Orders:  No orders of the defined types were placed in this encounter.  No orders of the defined types were placed in this encounter.    Procedures: No procedures performed  Clinical Data: No additional findings.  ROS:  All other systems negative, except as noted in the HPI. Review of Systems  Objective: Vital Signs: There were no vitals taken for this visit.  Specialty Comments:  No specialty comments available.  PMFS History: Patient Active Problem List   Diagnosis Date Noted   Hematoma of left lower extremity 11/04/2023   Chronic respiratory failure with hypoxia (HCC) 11/03/2023   Posttraumatic pretibial hematoma of left lower extremity, initial encounter 11/02/2023   Edema due to hypoalbuminemia 08/05/2023   Acute on chronic diastolic CHF (congestive heart failure) (HCC) 07/28/2023   Aortic stenosis 08/20/2021   Blepharitis of lower eyelids of both eyes 07/23/2021   Protein-calorie  malnutrition, severe 03/27/2021   Acute on chronic respiratory failure with hypoxia (HCC) - chronically on 2 L/min 03/24/2021   History of pulmonary embolus (PE) 03/24/2021   Pulmonary nodule 03/24/2021   Chronic diastolic CHF (congestive heart failure) (HCC) 03/24/2021   Pulmonary hypertension, unspecified (HCC) 02/14/2021   Vitreomacular traction syndrome of right eye 01/28/2021   Iron  deficiency anemia 09/07/2020   CKD (chronic kidney disease), stage IV (HCC) - baseline scr 1.7-2.0    Advanced nonexudative age-related macular degeneration of right eye without subfoveal involvement 03/15/2020   Pacemaker 11/01/2019   Tachycardia-bradycardia syndrome (HCC) 08/05/2019   Bradycardia 07/05/2019   Meibomian blepharitis, left 06/28/2019   Exudative age-related macular degeneration of right eye with active choroidal neovascularization (HCC) 05/03/2019   Cystoid macular edema of right eye 05/03/2019   Advanced nonexudative age-related macular degeneration of left eye with subfoveal involvement 05/03/2019   Fusion beats 08/03/2017   Atypical atrial flutter (HCC)    Persistent atrial fibrillation (HCC) 08/19/2016   On anticoagulant therapy 07/10/2016   PAF (paroxysmal atrial fibrillation) (HCC)    Essential hypertension 07/16/2009   TEMPORAL ARTERITIS 07/16/2009   Polymyalgia rheumatica 07/16/2009   PYOGENIC ARTHRITIS, LOWER LEG 07/02/2009   Past Medical History:  Diagnosis Date   Acute blood loss anemia 08/27/2020   Acute hypoxemic respiratory failure (HCC) 03/24/2021   Acute urinary retention 09/07/2020   Aortic stenosis 08/20/2021   Mild-moderate.  Repeat echo in one year.   Atrial fibrillation (HCC) 2017   a. s/p DCCV in 10/2015  b. recurrent in 08/2016 --> rate-control pursued.    Atrial fibrillation with RVR (HCC) 08/31/2015   Cancer (HCC)    Breast   CAP (community acquired pneumonia) 03/24/2021   CKD (chronic kidney disease)    GCA (giant cell arteritis) (HCC)    Hordeolum  internum left lower eyelid 06/28/2019   Hypertension    Laceration of right lower extremity 08/28/2020   Macular degeneration    Methicillin resistant Staphylococcus aureus infection 07/02/2009   Annotation: septic right knee  Qualifier: Diagnosis of   By: Lutricia OBIE Aquas      IMO SNOMED Dx Update Oct 2024     Osteoporosis    PMR (polymyalgia rheumatica)    Pulmonary hypertension, unspecified (HCC) 02/14/2021   Resistant hypertension 03/15/2019   Shortness of breath 03/15/2019   Skin cancer     Family History  Problem Relation Age of Onset   Stroke Mother    Kidney failure Father  died at 39   Heart disease Sister        died at 50   Heart disease Sister        died at 40   Breast cancer Daughter     Past Surgical History:  Procedure Laterality Date   APPLICATION OF WOUND VAC Left 11/06/2023   Procedure: APPLICATION, WOUND VAC;  Surgeon: Harden Jerona GAILS, MD;  Location: [facility] OR;  Service: Orthopedics;  Laterality: Left;   CARDIOVERSION N/A 10/18/2015   Procedure: CARDIOVERSION;  Surgeon: Oneil JAYSON Parchment, MD;  Location: [facility] ENDOSCOPY;  Service: Cardiovascular;  Laterality: N/A;   CARDIOVERSION N/A 02/20/2017   Procedure: CARDIOVERSION;  Surgeon: Mona Vinie JAYSON, MD;  Location: [facility] ENDOSCOPY;  Service: Cardiovascular;  Laterality: N/A;   CARDIOVERSION N/A 06/18/2017   Procedure: CARDIOVERSION;  Surgeon: Francyne Headland, MD;  Location: [facility] ENDOSCOPY;  Service: Cardiovascular;  Laterality: N/A;   CARDIOVERSION N/A 12/21/2018   Procedure: CARDIOVERSION;  Surgeon: Alveta Aleene PARAS, MD;  Location: [facility] ENDOSCOPY;  Service: Cardiovascular;  Laterality: N/A;   INCISION AND DRAINAGE OF DEEP ABSCESS, CALF Left 11/06/2023   Procedure: IRRIGATION AND DEBRIDEMENT LEFT LEG;  Surgeon: Harden Jerona GAILS, MD;  Location: [facility] OR;  Service: Orthopedics;  Laterality: Left;  LEFT LEG DEBRIDEMENT   IR CATHETER TUBE CHANGE  02/28/2021   KNEE SURGERY  2013   MASTECTOMY  1998   left side   PACEMAKER IMPLANT  N/A 07/22/2019   Procedure: PACEMAKER IMPLANT;  Surgeon: Waddell Danelle ORN, MD;  Location: [facility] INVASIVE CV LAB;  Service: Cardiovascular;  Laterality: N/A;   Social History   Occupational History   Not on file  Tobacco Use   Smoking status: Former    Current packs/day: 0.00    Types: Cigarettes    Quit date: 08/31/1966    Years since quitting: 57.2   Smokeless tobacco: Never  Vaping Use   Vaping status: Never Used  Substance and Sexual Activity   Alcohol use: No   Drug use: No   Sexual activity: Not on file

## 2023-11-25 DIAGNOSIS — K219 Gastro-esophageal reflux disease without esophagitis: Secondary | ICD-10-CM | POA: Diagnosis not present

## 2023-11-25 DIAGNOSIS — D649 Anemia, unspecified: Secondary | ICD-10-CM | POA: Diagnosis not present

## 2023-11-25 DIAGNOSIS — F411 Generalized anxiety disorder: Secondary | ICD-10-CM | POA: Diagnosis not present

## 2023-11-25 DIAGNOSIS — Z681 Body mass index (BMI) 19 or less, adult: Secondary | ICD-10-CM | POA: Diagnosis not present

## 2023-11-25 DIAGNOSIS — I10 Essential (primary) hypertension: Secondary | ICD-10-CM | POA: Diagnosis not present

## 2023-11-25 DIAGNOSIS — T148XXA Other injury of unspecified body region, initial encounter: Secondary | ICD-10-CM | POA: Diagnosis not present

## 2023-11-25 DIAGNOSIS — D509 Iron deficiency anemia, unspecified: Secondary | ICD-10-CM | POA: Diagnosis not present

## 2023-11-25 DIAGNOSIS — Z09 Encounter for follow-up examination after completed treatment for conditions other than malignant neoplasm: Secondary | ICD-10-CM | POA: Diagnosis not present

## 2023-11-25 DIAGNOSIS — N184 Chronic kidney disease, stage 4 (severe): Secondary | ICD-10-CM | POA: Diagnosis not present

## 2023-11-26 ENCOUNTER — Telehealth: Payer: Self-pay | Admitting: Orthopedic Surgery

## 2023-11-26 NOTE — Telephone Encounter (Signed)
 Allison Norman VO on skilled nursing.

## 2023-11-26 NOTE — Telephone Encounter (Signed)
 Allison Norman) from [facility] called requesting skilled nursing for wound . Also want to hold on PT per pt. Allison secure number is [phone].

## 2023-11-30 ENCOUNTER — Ambulatory Visit: Admitting: Physician Assistant

## 2023-11-30 ENCOUNTER — Encounter: Payer: Self-pay | Admitting: Physician Assistant

## 2023-11-30 DIAGNOSIS — T148XXA Other injury of unspecified body region, initial encounter: Secondary | ICD-10-CM

## 2023-11-30 DIAGNOSIS — M79662 Pain in left lower leg: Secondary | ICD-10-CM

## 2023-11-30 DIAGNOSIS — Z9889 Other specified postprocedural states: Secondary | ICD-10-CM

## 2023-11-30 NOTE — Progress Notes (Signed)
 Office Visit Note   Patient: Allison Norman           Date of Birth: 1924/03/08           MRN: [id number] Visit Date: 11/30/2023              Requested by: Okey Carlin Redbird, MD [address] [place],  [place] [address] PCP: Okey Carlin Redbird, MD  Chief Complaint  Patient presents with   Left Leg - Routine Post Op    11/06/23 I&D left leg      HPI: Allison Norman is a 89 y.o. female with a diagnosis of Hematoma Left Leg who failed conservative measures and elected for surgical management. She is s/p Excisional debridement hematoma left leg and Application Kerecis.   Assessment & Plan: Visit Diagnoses:  1. Traumatic hematoma   2. S/P evacuation of hematoma     Plan: Vashe wet to dry dressing daily ace wrap compression. Elevate PRN to decrease edema. We will try to get insurance approval for in office Kerecis approval.   Follow-Up Instructions: Return in about 15 days (around 12/15/2023).   Ortho Exam  Patient is alert, oriented, no adenopathy, well-dressed, normal affect, normal respiratory effort. Multiple areas of new skin coverage.  The wound measures 11 cm long and 8.5 cm wide.  Wound bed was cleaned with 4 x 4 and Vashe.  Pin point bleeding was accomplished.   She has doppler signals in AT/DP/PT. Minimal pedal edema in the left ankle and foot. No cellulitis or active drainage.    Imaging: No results found.    Labs: Lab Results  Component Value Date   ESRSEDRATE 55 (H) 08/14/2010   ESRSEDRATE 32 (H) 01/21/2010   ESRSEDRATE 123 (H) 09/27/2009   CRP 0.7 (H) 08/14/2010   CRP 0.5 01/21/2010   CRP 24.3 (H) 09/27/2009   REPTSTATUS 03/14/2023 FINAL 03/09/2023   GRAMSTAIN  03/25/2021    FEW WBC PRESENT, PREDOMINANTLY PMN FEW GRAM POSITIVE COCCI IN PAIRS IN CLUSTERS Performed at [facility], [address], [place], [place] [address]    CULT  03/09/2023    NO GROWTH 5 DAYS Performed at [facility], [address] [place], [place]  [address]    LABORGA METHICILLIN RESISTANT STAPHYLOCOCCUS AUREUS 03/25/2021     Lab Results  Component Value Date   ALBUMIN  2.7 (L) 11/04/2023   ALBUMIN  3.1 (L) 11/03/2023   ALBUMIN  2.7 (L) 07/28/2023   PREALBUMIN 10.3 (L) 03/25/2021    Lab Results  Component Value Date   MG 2.3 08/04/2023   MG 2.4 03/25/2021   MG 2.3 03/24/2021   No results found for: [facility]  Lab Results  Component Value Date   PREALBUMIN 10.3 (L) 03/25/2021      Latest Ref Rng & Units 11/09/2023    4:47 AM 11/08/2023    5:20 AM 11/07/2023    4:30 AM  CBC EXTENDED  WBC 4.0 - 10.5 K/uL 6.3  7.7  5.9   RBC 3.87 - 5.11 MIL/uL 3.04  3.51  3.37   Hemoglobin 12.0 - 15.0 g/dL 9.4  89.3  89.7   HCT 63.9 - 46.0 % 30.1  34.4  32.1   Platelets 150 - 400 K/uL 183  217  199      There is no height or weight on file to calculate BMI.  Orders:  No orders of the defined types were placed in this encounter.  No orders of the defined types  were placed in this encounter.    Procedures: No procedures performed  Clinical Data: No additional findings.  ROS:  All other systems negative, except as noted in the HPI. Review of Systems  Objective: Vital Signs: There were no vitals taken for this visit.  Specialty Comments:  No specialty comments available.  PMFS History: Patient Active Problem List   Diagnosis Date Noted   Hematoma of left lower extremity 11/04/2023   Chronic respiratory failure with hypoxia (HCC) 11/03/2023   Posttraumatic pretibial hematoma of left lower extremity, initial encounter 11/02/2023   Edema due to hypoalbuminemia 08/05/2023   Acute on chronic diastolic CHF (congestive heart failure) (HCC) 07/28/2023   Aortic stenosis 08/20/2021   Blepharitis of lower eyelids of both eyes 07/23/2021   Protein-calorie malnutrition, severe 03/27/2021   Acute on chronic respiratory failure with hypoxia (HCC) - chronically on 2 L/min 03/24/2021   History of pulmonary embolus (PE) 03/24/2021    Pulmonary nodule 03/24/2021   Chronic diastolic CHF (congestive heart failure) (HCC) 03/24/2021   Pulmonary hypertension, unspecified (HCC) 02/14/2021   Vitreomacular traction syndrome of right eye 01/28/2021   Iron  deficiency anemia 09/07/2020   CKD (chronic kidney disease), stage IV (HCC) - baseline scr 1.7-2.0    Advanced nonexudative age-related macular degeneration of right eye without subfoveal involvement 03/15/2020   Pacemaker 11/01/2019   Tachycardia-bradycardia syndrome (HCC) 08/05/2019   Bradycardia 07/05/2019   Meibomian blepharitis, left 06/28/2019   Exudative age-related macular degeneration of right eye with active choroidal neovascularization (HCC) 05/03/2019   Cystoid macular edema of right eye 05/03/2019   Advanced nonexudative age-related macular degeneration of left eye with subfoveal involvement 05/03/2019   Fusion beats 08/03/2017   Atypical atrial flutter (HCC)    Persistent atrial fibrillation (HCC) 08/19/2016   On anticoagulant therapy 07/10/2016   PAF (paroxysmal atrial fibrillation) (HCC)    Essential hypertension 07/16/2009   TEMPORAL ARTERITIS 07/16/2009   Polymyalgia rheumatica 07/16/2009   PYOGENIC ARTHRITIS, LOWER LEG 07/02/2009   Past Medical History:  Diagnosis Date   Acute blood loss anemia 08/27/2020   Acute hypoxemic respiratory failure (HCC) 03/24/2021   Acute urinary retention 09/07/2020   Aortic stenosis 08/20/2021   Mild-moderate.  Repeat echo in one year.   Atrial fibrillation (HCC) 2017   a. s/p DCCV in 10/2015  b. recurrent in 08/2016 --> rate-control pursued.    Atrial fibrillation with RVR (HCC) 08/31/2015   Cancer (HCC)    Breast   CAP (community acquired pneumonia) 03/24/2021   CKD (chronic kidney disease)    GCA (giant cell arteritis) (HCC)    Hordeolum internum left lower eyelid 06/28/2019   Hypertension    Laceration of right lower extremity 08/28/2020   Macular degeneration    Methicillin resistant Staphylococcus aureus  infection 07/02/2009   Annotation: septic right knee  Qualifier: Diagnosis of   By: Lutricia OBIE Aquas      IMO SNOMED Dx Update Oct 2024     Osteoporosis    PMR (polymyalgia rheumatica)    Pulmonary hypertension, unspecified (HCC) 02/14/2021   Resistant hypertension 03/15/2019   Shortness of breath 03/15/2019   Skin cancer     Family History  Problem Relation Age of Onset   Stroke Mother    Kidney failure Father        died at 67   Heart disease Sister        died at 92   Heart disease Sister        died at 31  Breast cancer Daughter     Past Surgical History:  Procedure Laterality Date   APPLICATION OF WOUND VAC Left 11/06/2023   Procedure: APPLICATION, WOUND VAC;  Surgeon: Harden Jerona GAILS, MD;  Location: [facility] OR;  Service: Orthopedics;  Laterality: Left;   CARDIOVERSION N/A 10/18/2015   Procedure: CARDIOVERSION;  Surgeon: Oneil JAYSON Parchment, MD;  Location: [facility] ENDOSCOPY;  Service: Cardiovascular;  Laterality: N/A;   CARDIOVERSION N/A 02/20/2017   Procedure: CARDIOVERSION;  Surgeon: Mona Vinie JAYSON, MD;  Location: [facility] ENDOSCOPY;  Service: Cardiovascular;  Laterality: N/A;   CARDIOVERSION N/A 06/18/2017   Procedure: CARDIOVERSION;  Surgeon: Francyne Headland, MD;  Location: [facility] ENDOSCOPY;  Service: Cardiovascular;  Laterality: N/A;   CARDIOVERSION N/A 12/21/2018   Procedure: CARDIOVERSION;  Surgeon: Alveta Aleene PARAS, MD;  Location: [facility] ENDOSCOPY;  Service: Cardiovascular;  Laterality: N/A;   INCISION AND DRAINAGE OF DEEP ABSCESS, CALF Left 11/06/2023   Procedure: IRRIGATION AND DEBRIDEMENT LEFT LEG;  Surgeon: Harden Jerona GAILS, MD;  Location: [facility] OR;  Service: Orthopedics;  Laterality: Left;  LEFT LEG DEBRIDEMENT   IR CATHETER TUBE CHANGE  02/28/2021   KNEE SURGERY  2013   MASTECTOMY  1998   left side   PACEMAKER IMPLANT N/A 07/22/2019   Procedure: PACEMAKER IMPLANT;  Surgeon: Waddell Danelle ORN, MD;  Location: [facility] INVASIVE CV LAB;  Service: Cardiovascular;  Laterality: N/A;   Social History    Occupational History   Not on file  Tobacco Use   Smoking status: Former    Current packs/day: 0.00    Types: Cigarettes    Quit date: 08/31/1966    Years since quitting: 57.2   Smokeless tobacco: Never  Vaping Use   Vaping status: Never Used  Substance and Sexual Activity   Alcohol use: No   Drug use: No   Sexual activity: Not on file

## 2023-12-08 ENCOUNTER — Telehealth: Payer: Self-pay | Admitting: Orthopedic Surgery

## 2023-12-08 ENCOUNTER — Other Ambulatory Visit (HOSPITAL_COMMUNITY): Payer: Self-pay

## 2023-12-08 NOTE — Telephone Encounter (Signed)
 Pt called wanting to know what the name of the wound wash is that she can use. Call back number is [phone] Allison Norman

## 2023-12-08 NOTE — Telephone Encounter (Signed)
 I called three times and the phone picks up and then disconnects. Will hold message and try again. The pt does have an appt on Friday and is approved for FSG will apply this at her visit.

## 2023-12-11 ENCOUNTER — Encounter: Payer: Self-pay | Admitting: Physician Assistant

## 2023-12-11 ENCOUNTER — Ambulatory Visit: Admitting: Physician Assistant

## 2023-12-11 DIAGNOSIS — T148XXA Other injury of unspecified body region, initial encounter: Secondary | ICD-10-CM | POA: Diagnosis not present

## 2023-12-11 DIAGNOSIS — S8012XA Contusion of left lower leg, initial encounter: Secondary | ICD-10-CM | POA: Diagnosis not present

## 2023-12-11 DIAGNOSIS — Z9889 Other specified postprocedural states: Secondary | ICD-10-CM

## 2023-12-11 NOTE — Telephone Encounter (Signed)
 Pt in office today and will receive graft.

## 2023-12-11 NOTE — Progress Notes (Signed)
 Office Visit Note   Patient: Allison Norman           Date of Birth: [birth year]           MRN: [id number] Visit Date: 12/11/2023              Requested by: Okey Carlin Redbird, MD [address] [place],  [place] [address] PCP: Okey Carlin Redbird, MD  Chief Complaint  Patient presents with   Left Leg - Routine Post Op    11/06/2023 LLE debridement       HPI: Allison Norman is a 89 y.o. female with a diagnosis of Hematoma Left Leg who failed conservative measures and elected for surgical management. She is s/p Excisional debridement hematoma left leg and Application Kerecis.  She has therapist, occupational for in office Kerecis application.    Assessment & Plan: Visit Diagnoses:  1. Traumatic hematoma   2. S/P evacuation of hematoma     Plan: The wound bed was cleaned with Vashe then 38 sq cm was used to fill in the wound to make it flat and irregular skin edges with Kerecis application, adaptic and dry guaze with mild compression using an ace wrap.  Elevation is encourage while at rest to reduce swelling.  She will change the outer dressing daily and leave the adaptic in place over the tissue graft.    Follow-Up Instructions: Return in about 1 week (around 12/18/2023).   Ortho Exam  Patient is alert, oriented, no adenopathy, well-dressed, normal affect, normal respiratory effort. Medial new skin that is flat and without edema or cellulitis.  The lateral half of the wound has 100 % granulation tissue and measures 8 cm x 3.5 cm.  She has bluish toes that regain pink coloration with elevation.  She has biphasic PT/DP signals on the left.             Imaging: No results found.     Labs: Lab Results  Component Value Date   ESRSEDRATE 55 (H) 08/14/2010   ESRSEDRATE 32 (H) 01/21/2010   ESRSEDRATE 123 (H) 09/27/2009   CRP 0.7 (H) 08/14/2010   CRP 0.5 01/21/2010   CRP 24.3 (H) 09/27/2009   REPTSTATUS 03/14/2023 FINAL 03/09/2023   GRAMSTAIN  03/25/2021    FEW WBC  PRESENT, PREDOMINANTLY PMN FEW GRAM POSITIVE COCCI IN PAIRS IN CLUSTERS Performed at [facility], [address], [place], [place] [address]    CULT  03/09/2023    NO GROWTH 5 DAYS Performed at [facility], [address], [place], [place] [address]    LABORGA METHICILLIN RESISTANT STAPHYLOCOCCUS AUREUS 03/25/2021     Lab Results  Component Value Date   ALBUMIN  2.7 (L) 11/04/2023   ALBUMIN  3.1 (L) 11/03/2023   ALBUMIN  2.7 (L) 07/28/2023   PREALBUMIN 10.3 (L) 03/25/2021    Lab Results  Component Value Date   MG 2.3 08/04/2023   MG 2.4 03/25/2021   MG 2.3 03/24/2021   No results found for: [facility]  Lab Results  Component Value Date   PREALBUMIN 10.3 (L) 03/25/2021      Latest Ref Rng & Units 11/09/2023    4:47 AM 11/08/2023    5:20 AM 11/07/2023    4:30 AM  CBC EXTENDED  WBC 4.0 - 10.5 K/uL 6.3  7.7  5.9   RBC 3.87 - 5.11 MIL/uL 3.04  3.51  3.37   Hemoglobin 12.0 - 15.0 g/dL 9.4  89.3  89.7   HCT  36.0 - 46.0 % 30.1  34.4  32.1   Platelets 150 - 400 K/uL 183  217  199      There is no height or weight on file to calculate BMI.  Orders:  No orders of the defined types were placed in this encounter.  No orders of the defined types were placed in this encounter.    Procedures: No procedures performed  Clinical Data: No additional findings.  ROS:  All other systems negative, except as noted in the HPI. Review of Systems  Objective: Vital Signs: There were no vitals taken for this visit.  Specialty Comments:  No specialty comments available.  PMFS History: Patient Active Problem List   Diagnosis Date Noted   Hematoma of left lower extremity 11/04/2023   Chronic respiratory failure with hypoxia (HCC) 11/03/2023   Posttraumatic pretibial hematoma of left lower extremity, initial encounter 11/02/2023   Edema due to hypoalbuminemia 08/05/2023   Acute on chronic diastolic CHF (congestive heart failure) (HCC) 07/28/2023   Aortic stenosis  08/20/2021   Blepharitis of lower eyelids of both eyes 07/23/2021   Protein-calorie malnutrition, severe 03/27/2021   Acute on chronic respiratory failure with hypoxia (HCC) - chronically on 2 L/min 03/24/2021   History of pulmonary embolus (PE) 03/24/2021   Pulmonary nodule 03/24/2021   Chronic diastolic CHF (congestive heart failure) (HCC) 03/24/2021   Pulmonary hypertension, unspecified (HCC) 02/14/2021   Vitreomacular traction syndrome of right eye 01/28/2021   Iron  deficiency anemia 09/07/2020   CKD (chronic kidney disease), stage IV (HCC) - baseline scr 1.7-2.0    Advanced nonexudative age-related macular degeneration of right eye without subfoveal involvement 03/15/2020   Pacemaker 11/01/2019   Tachycardia-bradycardia syndrome (HCC) 08/05/2019   Bradycardia 07/05/2019   Meibomian blepharitis, left 06/28/2019   Exudative age-related macular degeneration of right eye with active choroidal neovascularization (HCC) 05/03/2019   Cystoid macular edema of right eye 05/03/2019   Advanced nonexudative age-related macular degeneration of left eye with subfoveal involvement 05/03/2019   Fusion beats 08/03/2017   Atypical atrial flutter (HCC)    Persistent atrial fibrillation (HCC) 08/19/2016   On anticoagulant therapy 07/10/2016   PAF (paroxysmal atrial fibrillation) (HCC)    Essential hypertension 07/16/2009   TEMPORAL ARTERITIS 07/16/2009   Polymyalgia rheumatica 07/16/2009   PYOGENIC ARTHRITIS, LOWER LEG 07/02/2009   Past Medical History:  Diagnosis Date   Acute blood loss anemia 08/27/2020   Acute hypoxemic respiratory failure (HCC) 03/24/2021   Acute urinary retention 09/07/2020   Aortic stenosis 08/20/2021   Mild-moderate.  Repeat echo in one year.   Atrial fibrillation (HCC) 2017   a. s/p DCCV in 10/2015  b. recurrent in 08/2016 --> rate-control pursued.    Atrial fibrillation with RVR (HCC) 08/31/2015   Cancer (HCC)    Breast   CAP (community acquired pneumonia)  03/24/2021   CKD (chronic kidney disease)    GCA (giant cell arteritis) (HCC)    Hordeolum internum left lower eyelid 06/28/2019   Hypertension    Laceration of right lower extremity 08/28/2020   Macular degeneration    Methicillin resistant Staphylococcus aureus infection 07/02/2009   Annotation: septic right knee  Qualifier: Diagnosis of   By: Lutricia OBIE Aquas      IMO SNOMED Dx Update Oct 2024     Osteoporosis    PMR (polymyalgia rheumatica)    Pulmonary hypertension, unspecified (HCC) 02/14/2021   Resistant hypertension 03/15/2019   Shortness of breath 03/15/2019   Skin cancer     Family History  Problem Relation Age of Onset   Stroke Mother    Kidney failure Father        died at 86   Heart disease Sister        died at 66   Heart disease Sister        died at 40   Breast cancer Daughter     Past Surgical History:  Procedure Laterality Date   APPLICATION OF WOUND VAC Left 11/06/2023   Procedure: APPLICATION, WOUND VAC;  Surgeon: Harden Jerona GAILS, MD;  Location: [facility] OR;  Service: Orthopedics;  Laterality: Left;   CARDIOVERSION N/A 10/18/2015   Procedure: CARDIOVERSION;  Surgeon: Oneil JAYSON Parchment, MD;  Location: [facility] ENDOSCOPY;  Service: Cardiovascular;  Laterality: N/A;   CARDIOVERSION N/A 02/20/2017   Procedure: CARDIOVERSION;  Surgeon: Mona Vinie JAYSON, MD;  Location: [facility] ENDOSCOPY;  Service: Cardiovascular;  Laterality: N/A;   CARDIOVERSION N/A 06/18/2017   Procedure: CARDIOVERSION;  Surgeon: Francyne Headland, MD;  Location: [facility] ENDOSCOPY;  Service: Cardiovascular;  Laterality: N/A;   CARDIOVERSION N/A 12/21/2018   Procedure: CARDIOVERSION;  Surgeon: Alveta Aleene PARAS, MD;  Location: [facility] ENDOSCOPY;  Service: Cardiovascular;  Laterality: N/A;   INCISION AND DRAINAGE OF DEEP ABSCESS, CALF Left 11/06/2023   Procedure: IRRIGATION AND DEBRIDEMENT LEFT LEG;  Surgeon: Harden Jerona GAILS, MD;  Location: [facility] OR;  Service: Orthopedics;  Laterality: Left;  LEFT LEG DEBRIDEMENT   IR CATHETER TUBE  CHANGE  02/28/2021   KNEE SURGERY  2013   MASTECTOMY  1998   left side   PACEMAKER IMPLANT N/A 07/22/2019   Procedure: PACEMAKER IMPLANT;  Surgeon: Waddell Danelle ORN, MD;  Location: [facility] INVASIVE CV LAB;  Service: Cardiovascular;  Laterality: N/A;   Social History   Occupational History   Not on file  Tobacco Use   Smoking status: Former    Current packs/day: 0.00    Types: Cigarettes    Quit date: 08/31/1966    Years since quitting: 57.3   Smokeless tobacco: Never  Vaping Use   Vaping status: Never Used  Substance and Sexual Activity   Alcohol use: No   Drug use: No   Sexual activity: Not on file

## 2023-12-14 ENCOUNTER — Encounter: Admitting: Physician Assistant

## 2023-12-15 ENCOUNTER — Encounter: Admitting: Physician Assistant

## 2023-12-15 DIAGNOSIS — H353113 Nonexudative age-related macular degeneration, right eye, advanced atrophic without subfoveal involvement: Secondary | ICD-10-CM | POA: Diagnosis not present

## 2023-12-15 DIAGNOSIS — H353211 Exudative age-related macular degeneration, right eye, with active choroidal neovascularization: Secondary | ICD-10-CM | POA: Diagnosis not present

## 2023-12-15 DIAGNOSIS — H353124 Nonexudative age-related macular degeneration, left eye, advanced atrophic with subfoveal involvement: Secondary | ICD-10-CM | POA: Diagnosis not present

## 2023-12-15 DIAGNOSIS — H35351 Cystoid macular degeneration, right eye: Secondary | ICD-10-CM | POA: Diagnosis not present

## 2023-12-18 ENCOUNTER — Ambulatory Visit: Admitting: Physician Assistant

## 2023-12-18 ENCOUNTER — Encounter: Payer: Self-pay | Admitting: Physician Assistant

## 2023-12-18 DIAGNOSIS — Z9889 Other specified postprocedural states: Secondary | ICD-10-CM

## 2023-12-18 DIAGNOSIS — T148XXA Other injury of unspecified body region, initial encounter: Secondary | ICD-10-CM

## 2023-12-18 NOTE — Progress Notes (Signed)
 Office Visit Note   Patient: [name]           Date of Birth: [birth year]           MRN: [id number] Visit Date: 12/18/2023              Requested by: Okey Carlin Redbird, MD [address] [place],  [place] [address] PCP: Okey Carlin Redbird, MD  Chief Complaint  Patient presents with   Left Leg - Routine Post Op    11/06/2023 LLE debridement       HPI: Allison Norman is a 89 y.o. female with a diagnosis of Hematoma Left Leg who failed conservative measures and elected for surgical management. She is s/p Excisional debridement hematoma left leg and Application Kerecis.  She has therapist, occupational for in office Kerecis application.  She received 38 sq cm.    Assessment & Plan: Visit Diagnoses: No diagnosis found.  Plan: 8 sq cm of Kerecis tissue graft was applied to the wound,  adaptic and dry guaze with mild compression using an ace wrap. Elevation is encourage while at rest to reduce swelling. She will change the outer dressing daily and leave the adaptic in place over the tissue graft.   Follow-Up Instructions: Return in about 1 week (around 12/25/2023).   Ortho Exam  Patient is alert, oriented, no adenopathy, well-dressed, normal affect, normal respiratory effort. Minimal edema in the let LE.  No cellulitis and 100% granulation tissue in the wound base.  The wound measures 6 cm x 2.5 cm and is flat.      Imaging: No results found.   Labs: Lab Results  Component Value Date   ESRSEDRATE 55 (H) 08/14/2010   ESRSEDRATE 32 (H) 01/21/2010   ESRSEDRATE 123 (H) 09/27/2009   CRP 0.7 (H) 08/14/2010   CRP 0.5 01/21/2010   CRP 24.3 (H) 09/27/2009   REPTSTATUS 03/14/2023 FINAL 03/09/2023   GRAMSTAIN  03/25/2021    FEW WBC PRESENT, PREDOMINANTLY PMN FEW GRAM POSITIVE COCCI IN PAIRS IN CLUSTERS Performed at [facility] Lab, [address] [place], [place] [address]    CULT  03/09/2023    NO GROWTH 5 DAYS Performed at [facility], [address]  [place], [place] [address]    LABORGA METHICILLIN RESISTANT STAPHYLOCOCCUS AUREUS 03/25/2021     Lab Results  Component Value Date   ALBUMIN  2.7 (L) 11/04/2023   ALBUMIN  3.1 (L) 11/03/2023   ALBUMIN  2.7 (L) 07/28/2023   PREALBUMIN 10.3 (L) 03/25/2021    Lab Results  Component Value Date   MG 2.3 08/04/2023   MG 2.4 03/25/2021   MG 2.3 03/24/2021   No results found for: [facility]  Lab Results  Component Value Date   PREALBUMIN 10.3 (L) 03/25/2021      Latest Ref Rng & Units 11/09/2023    4:47 AM 11/08/2023    5:20 AM 11/07/2023    4:30 AM  CBC EXTENDED  WBC 4.0 - 10.5 K/uL 6.3  7.7  5.9   RBC 3.87 - 5.11 MIL/uL 3.04  3.51  3.37   Hemoglobin 12.0 - 15.0 g/dL 9.4  89.3  89.7   HCT 63.9 - 46.0 % 30.1  34.4  32.1   Platelets 150 - 400 K/uL 183  217  199      There is no height or weight on file to calculate BMI.  Orders:  No orders of the defined types were placed in this encounter.  No orders  of the defined types were placed in this encounter.    Procedures: No procedures performed  Clinical Data: No additional findings.  ROS:  All other systems negative, except as noted in the HPI. Review of Systems  Objective: Vital Signs: There were no vitals taken for this visit.  Specialty Comments:  No specialty comments available.  PMFS History: Patient Active Problem List   Diagnosis Date Noted   Hematoma of left lower extremity 11/04/2023   Chronic respiratory failure with hypoxia (HCC) 11/03/2023   Posttraumatic pretibial hematoma of left lower extremity, initial encounter 11/02/2023   Edema due to hypoalbuminemia 08/05/2023   Acute on chronic diastolic CHF (congestive heart failure) (HCC) 07/28/2023   Aortic stenosis 08/20/2021   Blepharitis of lower eyelids of both eyes 07/23/2021   Protein-calorie malnutrition, severe 03/27/2021   Acute on chronic respiratory failure with hypoxia (HCC) - chronically on 2 L/min 03/24/2021   History of pulmonary embolus (PE)  03/24/2021   Pulmonary nodule 03/24/2021   Chronic diastolic CHF (congestive heart failure) (HCC) 03/24/2021   Pulmonary hypertension, unspecified (HCC) 02/14/2021   Vitreomacular traction syndrome of right eye 01/28/2021   Iron  deficiency anemia 09/07/2020   CKD (chronic kidney disease), stage IV (HCC) - baseline scr 1.7-2.0    Advanced nonexudative age-related macular degeneration of right eye without subfoveal involvement 03/15/2020   Pacemaker 11/01/2019   Tachycardia-bradycardia syndrome (HCC) 08/05/2019   Bradycardia 07/05/2019   Meibomian blepharitis, left 06/28/2019   Exudative age-related macular degeneration of right eye with active choroidal neovascularization (HCC) 05/03/2019   Cystoid macular edema of right eye 05/03/2019   Advanced nonexudative age-related macular degeneration of left eye with subfoveal involvement 05/03/2019   Fusion beats 08/03/2017   Atypical atrial flutter (HCC)    Persistent atrial fibrillation (HCC) 08/19/2016   On anticoagulant therapy 07/10/2016   PAF (paroxysmal atrial fibrillation) (HCC)    Essential hypertension 07/16/2009   TEMPORAL ARTERITIS 07/16/2009   Polymyalgia rheumatica 07/16/2009   PYOGENIC ARTHRITIS, LOWER LEG 07/02/2009   Past Medical History:  Diagnosis Date   Acute blood loss anemia 08/27/2020   Acute hypoxemic respiratory failure (HCC) 03/24/2021   Acute urinary retention 09/07/2020   Aortic stenosis 08/20/2021   Mild-moderate.  Repeat echo in one year.   Atrial fibrillation (HCC) 2017   a. s/p DCCV in 10/2015  b. recurrent in 08/2016 --> rate-control pursued.    Atrial fibrillation with RVR (HCC) 08/31/2015   Cancer (HCC)    Breast   CAP (community acquired pneumonia) 03/24/2021   CKD (chronic kidney disease)    GCA (giant cell arteritis) (HCC)    Hordeolum internum left lower eyelid 06/28/2019   Hypertension    Laceration of right lower extremity 08/28/2020   Macular degeneration    Methicillin resistant  Staphylococcus aureus infection 07/02/2009   Annotation: septic right knee  Qualifier: Diagnosis of   By: Lutricia OBIE Aquas      IMO SNOMED Dx Update Oct 2024     Osteoporosis    PMR (polymyalgia rheumatica)    Pulmonary hypertension, unspecified (HCC) 02/14/2021   Resistant hypertension 03/15/2019   Shortness of breath 03/15/2019   Skin cancer     Family History  Problem Relation Age of Onset   Stroke Mother    Kidney failure Father        died at 15   Heart disease Sister        died at 85   Heart disease Sister        died  at 81   Breast cancer Daughter     Past Surgical History:  Procedure Laterality Date   APPLICATION OF WOUND VAC Left 11/06/2023   Procedure: APPLICATION, WOUND VAC;  Surgeon: Harden Jerona GAILS, MD;  Location: [facility] OR;  Service: Orthopedics;  Laterality: Left;   CARDIOVERSION N/A 10/18/2015   Procedure: CARDIOVERSION;  Surgeon: Oneil JAYSON Parchment, MD;  Location: [facility] ENDOSCOPY;  Service: Cardiovascular;  Laterality: N/A;   CARDIOVERSION N/A 02/20/2017   Procedure: CARDIOVERSION;  Surgeon: Mona Vinie JAYSON, MD;  Location: [facility] ENDOSCOPY;  Service: Cardiovascular;  Laterality: N/A;   CARDIOVERSION N/A 06/18/2017   Procedure: CARDIOVERSION;  Surgeon: Francyne Headland, MD;  Location: [facility] ENDOSCOPY;  Service: Cardiovascular;  Laterality: N/A;   CARDIOVERSION N/A 12/21/2018   Procedure: CARDIOVERSION;  Surgeon: Alveta Aleene PARAS, MD;  Location: [facility] ENDOSCOPY;  Service: Cardiovascular;  Laterality: N/A;   INCISION AND DRAINAGE OF DEEP ABSCESS, CALF Left 11/06/2023   Procedure: IRRIGATION AND DEBRIDEMENT LEFT LEG;  Surgeon: Harden Jerona GAILS, MD;  Location: [facility] OR;  Service: Orthopedics;  Laterality: Left;  LEFT LEG DEBRIDEMENT   IR CATHETER TUBE CHANGE  02/28/2021   KNEE SURGERY  2013   MASTECTOMY  1998   left side   PACEMAKER IMPLANT N/A 07/22/2019   Procedure: PACEMAKER IMPLANT;  Surgeon: Waddell Danelle ORN, MD;  Location: [facility] INVASIVE CV LAB;  Service: Cardiovascular;  Laterality: N/A;    Social History   Occupational History   Not on file  Tobacco Use   Smoking status: Former    Current packs/day: 0.00    Types: Cigarettes    Quit date: 08/31/1966    Years since quitting: 57.3   Smokeless tobacco: Never  Vaping Use   Vaping status: Never Used  Substance and Sexual Activity   Alcohol use: No   Drug use: No   Sexual activity: Not on file

## 2023-12-23 ENCOUNTER — Other Ambulatory Visit: Payer: Self-pay | Admitting: Internal Medicine

## 2023-12-24 ENCOUNTER — Ambulatory Visit: Admitting: Physician Assistant

## 2023-12-25 ENCOUNTER — Ambulatory Visit: Admitting: Physician Assistant

## 2023-12-25 ENCOUNTER — Encounter: Payer: Self-pay | Admitting: Physician Assistant

## 2023-12-25 DIAGNOSIS — Z9889 Other specified postprocedural states: Secondary | ICD-10-CM

## 2023-12-25 DIAGNOSIS — T148XXA Other injury of unspecified body region, initial encounter: Secondary | ICD-10-CM

## 2023-12-25 NOTE — Progress Notes (Signed)
 "  Office Visit Note   Patient: Allison Norman           Date of Birth: 06-12-1924           MRN: [id number] Visit Date: 12/25/2023              Requested by: Okey Carlin Redbird, MD [address] [place],  [place] [address] PCP: Okey Carlin Redbird, MD  Chief Complaint  Patient presents with   Left Leg - Routine Post Op    11/06/2023 LLE debridement      HPI: Allison Norman is a 89 y.o. female with a diagnosis of Hematoma Left Leg who failed conservative measures and elected for surgical management. She is s/p Excisional debridement hematoma left leg and Application Kerecis.  She has therapist, occupational for in office Kerecis application.  She has received Kerecis twice now.    After the last application she has an bleeding episode and accidentally lost some of the tissue graft when cleaning her leg.  She is on anticoagulation using 2,5 mg of Eliquis  daily.    Assessment & Plan: Visit Diagnoses:  1. Traumatic hematoma   2. S/P evacuation of hematoma     Plan: Wet to dry Vashe with ace wrap, elevation when at rest.  May shower PRN.    Follow-Up Instructions: Return in about 2 weeks (around 01/08/2024).   Ortho Exam  Patient is alert, oriented, no adenopathy, well-dressed, normal affect, normal respiratory effort. The wounds are smaller.  The proximal wound measures 3.7 cm x 2.5 cm the distal wound measures 4.7 cm x 1.5 cm.  Silver nitrate was used to stop bleeding.      Imaging: No results found. No images are attached to the encounter.  Labs: Lab Results  Component Value Date   ESRSEDRATE 55 (H) 08/14/2010   ESRSEDRATE 32 (H) 01/21/2010   ESRSEDRATE 123 (H) 09/27/2009   CRP 0.7 (H) 08/14/2010   CRP 0.5 01/21/2010   CRP 24.3 (H) 09/27/2009   REPTSTATUS 03/14/2023 FINAL 03/09/2023   GRAMSTAIN  03/25/2021    FEW WBC PRESENT, PREDOMINANTLY PMN FEW GRAM POSITIVE COCCI IN PAIRS IN CLUSTERS Performed at [facility] Lab, [address], [place], [place] [address]     CULT  03/09/2023    NO GROWTH 5 DAYS Performed at [facility], [address], [place], [place] [address]    LABORGA METHICILLIN RESISTANT STAPHYLOCOCCUS AUREUS 03/25/2021     Lab Results  Component Value Date   ALBUMIN  2.7 (L) 11/04/2023   ALBUMIN  3.1 (L) 11/03/2023   ALBUMIN  2.7 (L) 07/28/2023   PREALBUMIN 10.3 (L) 03/25/2021    Lab Results  Component Value Date   MG 2.3 08/04/2023   MG 2.4 03/25/2021   MG 2.3 03/24/2021   No results found for: [facility]  Lab Results  Component Value Date   PREALBUMIN 10.3 (L) 03/25/2021      Latest Ref Rng & Units 11/09/2023    4:47 AM 11/08/2023    5:20 AM 11/07/2023    4:30 AM  CBC EXTENDED  WBC 4.0 - 10.5 K/uL 6.3  7.7  5.9   RBC 3.87 - 5.11 MIL/uL 3.04  3.51  3.37   Hemoglobin 12.0 - 15.0 g/dL 9.4  89.3  89.7   HCT 63.9 - 46.0 % 30.1  34.4  32.1   Platelets 150 - 400 K/uL 183  217  199      There is no height or weight on  file to calculate BMI.  Orders:  No orders of the defined types were placed in this encounter.  No orders of the defined types were placed in this encounter.    Procedures: No procedures performed  Clinical Data: No additional findings.  ROS:  All other systems negative, except as noted in the HPI. Review of Systems  Objective: Vital Signs: There were no vitals taken for this visit.  Specialty Comments:  No specialty comments available.  PMFS History: Patient Active Problem List   Diagnosis Date Noted   Hematoma of left lower extremity 11/04/2023   Chronic respiratory failure with hypoxia (HCC) 11/03/2023   Posttraumatic pretibial hematoma of left lower extremity, initial encounter 11/02/2023   Edema due to hypoalbuminemia 08/05/2023   Acute on chronic diastolic CHF (congestive heart failure) (HCC) 07/28/2023   Aortic stenosis 08/20/2021   Blepharitis of lower eyelids of both eyes 07/23/2021   Protein-calorie malnutrition, severe 03/27/2021   Acute on chronic respiratory  failure with hypoxia (HCC) - chronically on 2 L/min 03/24/2021   History of pulmonary embolus (PE) 03/24/2021   Pulmonary nodule 03/24/2021   Chronic diastolic CHF (congestive heart failure) (HCC) 03/24/2021   Pulmonary hypertension, unspecified (HCC) 02/14/2021   Vitreomacular traction syndrome of right eye 01/28/2021   Iron  deficiency anemia 09/07/2020   CKD (chronic kidney disease), stage IV (HCC) - baseline scr 1.7-2.0    Advanced nonexudative age-related macular degeneration of right eye without subfoveal involvement 03/15/2020   Pacemaker 11/01/2019   Tachycardia-bradycardia syndrome (HCC) 08/05/2019   Bradycardia 07/05/2019   Meibomian blepharitis, left 06/28/2019   Exudative age-related macular degeneration of right eye with active choroidal neovascularization (HCC) 05/03/2019   Cystoid macular edema of right eye 05/03/2019   Advanced nonexudative age-related macular degeneration of left eye with subfoveal involvement 05/03/2019   Fusion beats 08/03/2017   Atypical atrial flutter (HCC)    Persistent atrial fibrillation (HCC) 08/19/2016   On anticoagulant therapy 07/10/2016   PAF (paroxysmal atrial fibrillation) (HCC)    Essential hypertension 07/16/2009   TEMPORAL ARTERITIS 07/16/2009   Polymyalgia rheumatica 07/16/2009   PYOGENIC ARTHRITIS, LOWER LEG 07/02/2009   Past Medical History:  Diagnosis Date   Acute blood loss anemia 08/27/2020   Acute hypoxemic respiratory failure (HCC) 03/24/2021   Acute urinary retention 09/07/2020   Aortic stenosis 08/20/2021   Mild-moderate.  Repeat echo in one year.   Atrial fibrillation (HCC) 2017   a. s/p DCCV in 10/2015  b. recurrent in 08/2016 --> rate-control pursued.    Atrial fibrillation with RVR (HCC) 08/31/2015   Cancer (HCC)    Breast   CAP (community acquired pneumonia) 03/24/2021   CKD (chronic kidney disease)    GCA (giant cell arteritis) (HCC)    Hordeolum internum left lower eyelid 06/28/2019   Hypertension     Laceration of right lower extremity 08/28/2020   Macular degeneration    Methicillin resistant Staphylococcus aureus infection 07/02/2009   Annotation: septic right knee  Qualifier: Diagnosis of   By: Lutricia OBIE Aquas      IMO SNOMED Dx Update Oct 2024     Osteoporosis    PMR (polymyalgia rheumatica)    Pulmonary hypertension, unspecified (HCC) 02/14/2021   Resistant hypertension 03/15/2019   Shortness of breath 03/15/2019   Skin cancer     Family History  Problem Relation Age of Onset   Stroke Mother    Kidney failure Father        died at 41   Heart disease Sister  died at 55   Heart disease Sister        died at 65   Breast cancer Daughter     Past Surgical History:  Procedure Laterality Date   APPLICATION OF WOUND VAC Left 11/06/2023   Procedure: APPLICATION, WOUND VAC;  Surgeon: Harden Jerona GAILS, MD;  Location: [facility] OR;  Service: Orthopedics;  Laterality: Left;   CARDIOVERSION N/A 10/18/2015   Procedure: CARDIOVERSION;  Surgeon: Oneil JAYSON Parchment, MD;  Location: [facility] ENDOSCOPY;  Service: Cardiovascular;  Laterality: N/A;   CARDIOVERSION N/A 02/20/2017   Procedure: CARDIOVERSION;  Surgeon: Mona Vinie JAYSON, MD;  Location: [facility] ENDOSCOPY;  Service: Cardiovascular;  Laterality: N/A;   CARDIOVERSION N/A 06/18/2017   Procedure: CARDIOVERSION;  Surgeon: Francyne Headland, MD;  Location: [facility] ENDOSCOPY;  Service: Cardiovascular;  Laterality: N/A;   CARDIOVERSION N/A 12/21/2018   Procedure: CARDIOVERSION;  Surgeon: Alveta Aleene PARAS, MD;  Location: [facility] ENDOSCOPY;  Service: Cardiovascular;  Laterality: N/A;   INCISION AND DRAINAGE OF DEEP ABSCESS, CALF Left 11/06/2023   Procedure: IRRIGATION AND DEBRIDEMENT LEFT LEG;  Surgeon: Harden Jerona GAILS, MD;  Location: [facility] OR;  Service: Orthopedics;  Laterality: Left;  LEFT LEG DEBRIDEMENT   IR CATHETER TUBE CHANGE  02/28/2021   KNEE SURGERY  2013   MASTECTOMY  1998   left side   PACEMAKER IMPLANT N/A 07/22/2019   Procedure: PACEMAKER IMPLANT;  Surgeon:  Waddell Danelle ORN, MD;  Location: [facility] INVASIVE CV LAB;  Service: Cardiovascular;  Laterality: N/A;   Social History   Occupational History   Not on file  Tobacco Use   Smoking status: Former    Current packs/day: 0.00    Types: Cigarettes    Quit date: 08/31/1966    Years since quitting: 57.3   Smokeless tobacco: Never  Vaping Use   Vaping status: Never Used  Substance and Sexual Activity   Alcohol use: No   Drug use: No   Sexual activity: Not on file       "

## 2024-01-01 NOTE — Telephone Encounter (Signed)
 Spoke w/ patient - she is scheduled w/ EP APP on 2/10.

## 2024-01-04 ENCOUNTER — Telehealth: Payer: Self-pay | Admitting: Orthopedic Surgery

## 2024-01-04 NOTE — Telephone Encounter (Signed)
 Pt called asking for a call back from brittany. Pt states she change her wound everyday the skin comes off and she is asking what can she use to avoid her skin coming off on bandage. Please call pt sometime to day at [phone]

## 2024-01-05 ENCOUNTER — Ambulatory Visit: Admitting: Orthopedic Surgery

## 2024-01-05 DIAGNOSIS — T148XXA Other injury of unspecified body region, initial encounter: Secondary | ICD-10-CM

## 2024-01-05 DIAGNOSIS — Z9889 Other specified postprocedural states: Secondary | ICD-10-CM

## 2024-01-05 NOTE — Telephone Encounter (Signed)
 I spoke with patient yesterday afternoon. She was told to use neosporin on the wound with gauze and ace wrap as usual. She says her wound is worse now with the skin tearing around it. I made her an appointment for eval this afternoon with Dr. Harden.

## 2024-01-06 ENCOUNTER — Encounter: Payer: Self-pay | Admitting: Orthopedic Surgery

## 2024-01-06 NOTE — Progress Notes (Signed)
 "  Office Visit Note   Patient: Allison Norman           Date of Birth: 05/13/1924           MRN: [id number] Visit Date: 01/05/2024              Requested by: Okey Carlin Redbird, MD [address] [place],  [place] [address] PCP: Okey Carlin Redbird, MD  Chief Complaint  Patient presents with   Left Leg - Routine Post Op    11/06/2023 LLE debridement      HPI: Discussed the use of AI scribe software for clinical note transcription with the patient, who gave verbal consent to proceed.  History of Present Illness Allison Norman is a 89 year old female with a chronic skin ulcer who presents for follow-up of wound care and dressing management.  She has a chronic wound measuring approximately 4 x 9 cm, which she manages at home with Neosporin and various dressings. Neosporin has prevented dressings from adhering to the wound, whereas prior use of gauze resulted in traumatic skin removal. She has also used Xeroform dressings and inquires about their appropriateness and whether Neosporin should be used concurrently.  She describes episodes of wound bleeding, including yesterday, and notes that the skin is fragile and easily removed during dressing changes. She has modified dressings by cutting off adhesive portions to minimize trauma. The wound previously exhibited bulging and bubbling. She did not experience difficulty removing the dressing today, which she attributes to changes in her dressing technique.  Surgical debridement 11/06/2023.  Patient is 2 months out from surgery.  Assessment & Plan: Visit Diagnoses: No diagnosis found.  Plan: Assessment and Plan Assessment & Plan Chronic skin ulcer Chronic skin ulcer with 100% healthy granulation tissue, slightly proud. Healing is progressing appropriately. No infection, erythema, or psoriatic changes. - Applied Bactroban  dressing during the visit. - Treated proud granulation tissue with silver nitrate to flatten. - Recommended daily  Neosporin ointment if non-Xeroform dressings are used. - Advised Xeroform dressing at home as an alternative; Neosporin is unnecessary if Xeroform is used. - Instructed to avoid direct application of gauze to prevent trauma to fragile tissue. - Scheduled follow-up in two weeks for wound reevaluation.      Follow-Up Instructions: No follow-ups on file.   Ortho Exam  Patient is alert, oriented, no adenopathy, well-dressed, normal affect, normal respiratory effort. Physical Exam SKIN: Wound with 100% healthy granulation tissue, slightly proud, measuring 4x9 cm. No redness, psoriasis, or signs of infection.  Hypergranulation tissue which was touched with silver nitrate to flatten the wound.      Imaging: No results found.   Labs: Lab Results  Component Value Date   ESRSEDRATE 55 (H) 08/14/2010   ESRSEDRATE 32 (H) 01/21/2010   ESRSEDRATE 123 (H) 09/27/2009   CRP 0.7 (H) 08/14/2010   CRP 0.5 01/21/2010   CRP 24.3 (H) 09/27/2009   REPTSTATUS 03/14/2023 FINAL 03/09/2023   GRAMSTAIN  03/25/2021    FEW WBC PRESENT, PREDOMINANTLY PMN FEW GRAM POSITIVE COCCI IN PAIRS IN CLUSTERS Performed at [facility], [address] [place], [place] [address]    CULT  03/09/2023    NO GROWTH 5 DAYS Performed at [facility], [address], [place], [place] [address]    LABORGA METHICILLIN RESISTANT STAPHYLOCOCCUS AUREUS 03/25/2021     Lab Results  Component Value Date   ALBUMIN  2.7 (L) 11/04/2023   ALBUMIN  3.1 (L) 11/03/2023   ALBUMIN   2.7 (L) 07/28/2023   PREALBUMIN 10.3 (L) 03/25/2021    Lab Results  Component Value Date   MG 2.3 08/04/2023   MG 2.4 03/25/2021   MG 2.3 03/24/2021   No results found for: [facility]  Lab Results  Component Value Date   PREALBUMIN 10.3 (L) 03/25/2021      Latest Ref Rng & Units 11/09/2023    4:47 AM 11/08/2023    5:20 AM 11/07/2023    4:30 AM  CBC EXTENDED  WBC 4.0 - 10.5 K/uL 6.3  7.7  5.9   RBC 3.87 - 5.11 MIL/uL 3.04  3.51   3.37   Hemoglobin 12.0 - 15.0 g/dL 9.4  89.3  89.7   HCT 63.9 - 46.0 % 30.1  34.4  32.1   Platelets 150 - 400 K/uL 183  217  199      There is no height or weight on file to calculate BMI.  Orders:  No orders of the defined types were placed in this encounter.  No orders of the defined types were placed in this encounter.    Procedures: No procedures performed  Clinical Data: No additional findings.  ROS:  All other systems negative, except as noted in the HPI. Review of Systems  Objective: Vital Signs: There were no vitals taken for this visit.  Specialty Comments:  No specialty comments available.  PMFS History: Patient Active Problem List   Diagnosis Date Noted   Hematoma of left lower extremity 11/04/2023   Chronic respiratory failure with hypoxia (HCC) 11/03/2023   Posttraumatic pretibial hematoma of left lower extremity, initial encounter 11/02/2023   Edema due to hypoalbuminemia 08/05/2023   Acute on chronic diastolic CHF (congestive heart failure) (HCC) 07/28/2023   Aortic stenosis 08/20/2021   Blepharitis of lower eyelids of both eyes 07/23/2021   Protein-calorie malnutrition, severe 03/27/2021   Acute on chronic respiratory failure with hypoxia (HCC) - chronically on 2 L/min 03/24/2021   History of pulmonary embolus (PE) 03/24/2021   Pulmonary nodule 03/24/2021   Chronic diastolic CHF (congestive heart failure) (HCC) 03/24/2021   Pulmonary hypertension, unspecified (HCC) 02/14/2021   Vitreomacular traction syndrome of right eye 01/28/2021   Iron  deficiency anemia 09/07/2020   CKD (chronic kidney disease), stage IV (HCC) - baseline scr 1.7-2.0    Advanced nonexudative age-related macular degeneration of right eye without subfoveal involvement 03/15/2020   Pacemaker 11/01/2019   Tachycardia-bradycardia syndrome (HCC) 08/05/2019   Bradycardia 07/05/2019   Meibomian blepharitis, left 06/28/2019   Exudative age-related macular degeneration of right eye  with active choroidal neovascularization (HCC) 05/03/2019   Cystoid macular edema of right eye 05/03/2019   Advanced nonexudative age-related macular degeneration of left eye with subfoveal involvement 05/03/2019   Fusion beats 08/03/2017   Atypical atrial flutter (HCC)    Persistent atrial fibrillation (HCC) 08/19/2016   On anticoagulant therapy 07/10/2016   PAF (paroxysmal atrial fibrillation) (HCC)    Essential hypertension 07/16/2009   TEMPORAL ARTERITIS 07/16/2009   Polymyalgia rheumatica 07/16/2009   PYOGENIC ARTHRITIS, LOWER LEG 07/02/2009   Past Medical History:  Diagnosis Date   Acute blood loss anemia 08/27/2020   Acute hypoxemic respiratory failure (HCC) 03/24/2021   Acute urinary retention 09/07/2020   Aortic stenosis 08/20/2021   Mild-moderate.  Repeat echo in one year.   Atrial fibrillation (HCC) 2017   a. s/p DCCV in 10/2015  b. recurrent in 08/2016 --> rate-control pursued.    Atrial fibrillation with RVR (HCC) 08/31/2015   Cancer (HCC)    Breast  CAP (community acquired pneumonia) 03/24/2021   CKD (chronic kidney disease)    GCA (giant cell arteritis) (HCC)    Hordeolum internum left lower eyelid 06/28/2019   Hypertension    Laceration of right lower extremity 08/28/2020   Macular degeneration    Methicillin resistant Staphylococcus aureus infection 07/02/2009   Annotation: septic right knee  Qualifier: Diagnosis of   By: Lutricia OBIE Aquas      IMO SNOMED Dx Update Oct 2024     Osteoporosis    PMR (polymyalgia rheumatica)    Pulmonary hypertension, unspecified (HCC) 02/14/2021   Resistant hypertension 03/15/2019   Shortness of breath 03/15/2019   Skin cancer     Family History  Problem Relation Age of Onset   Stroke Mother    Kidney failure Father        died at 71   Heart disease Sister        died at 48   Heart disease Sister        died at 65   Breast cancer Daughter     Past Surgical History:  Procedure Laterality Date   APPLICATION OF  WOUND VAC Left 11/06/2023   Procedure: APPLICATION, WOUND VAC;  Surgeon: Harden Jerona GAILS, MD;  Location: [facility] OR;  Service: Orthopedics;  Laterality: Left;   CARDIOVERSION N/A 10/18/2015   Procedure: CARDIOVERSION;  Surgeon: Oneil JAYSON Parchment, MD;  Location: [facility] ENDOSCOPY;  Service: Cardiovascular;  Laterality: N/A;   CARDIOVERSION N/A 02/20/2017   Procedure: CARDIOVERSION;  Surgeon: Mona Vinie JAYSON, MD;  Location: [facility] ENDOSCOPY;  Service: Cardiovascular;  Laterality: N/A;   CARDIOVERSION N/A 06/18/2017   Procedure: CARDIOVERSION;  Surgeon: Francyne Headland, MD;  Location: [facility] ENDOSCOPY;  Service: Cardiovascular;  Laterality: N/A;   CARDIOVERSION N/A 12/21/2018   Procedure: CARDIOVERSION;  Surgeon: Alveta Aleene PARAS, MD;  Location: [facility] ENDOSCOPY;  Service: Cardiovascular;  Laterality: N/A;   INCISION AND DRAINAGE OF DEEP ABSCESS, CALF Left 11/06/2023   Procedure: IRRIGATION AND DEBRIDEMENT LEFT LEG;  Surgeon: Harden Jerona GAILS, MD;  Location: [facility] OR;  Service: Orthopedics;  Laterality: Left;  LEFT LEG DEBRIDEMENT   IR CATHETER TUBE CHANGE  02/28/2021   KNEE SURGERY  2013   MASTECTOMY  1998   left side   PACEMAKER IMPLANT N/A 07/22/2019   Procedure: PACEMAKER IMPLANT;  Surgeon: Waddell Danelle ORN, MD;  Location: [facility] INVASIVE CV LAB;  Service: Cardiovascular;  Laterality: N/A;   Social History   Occupational History   Not on file  Tobacco Use   Smoking status: Former    Current packs/day: 0.00    Types: Cigarettes    Quit date: 08/31/1966    Years since quitting: 57.3   Smokeless tobacco: Never  Vaping Use   Vaping status: Never Used  Substance and Sexual Activity   Alcohol use: No   Drug use: No   Sexual activity: Not on file         "

## 2024-01-08 ENCOUNTER — Ambulatory Visit: Admitting: Physician Assistant

## 2024-01-15 ENCOUNTER — Ambulatory Visit

## 2024-01-15 DIAGNOSIS — I495 Sick sinus syndrome: Secondary | ICD-10-CM

## 2024-01-19 ENCOUNTER — Ambulatory Visit: Admitting: Physician Assistant

## 2024-01-19 ENCOUNTER — Encounter: Payer: Self-pay | Admitting: Physician Assistant

## 2024-01-19 DIAGNOSIS — Z9889 Other specified postprocedural states: Secondary | ICD-10-CM | POA: Diagnosis not present

## 2024-01-19 LAB — CUP PACEART REMOTE DEVICE CHECK
Battery Remaining Longevity: 54 mo
Battery Remaining Percentage: 87 %
Brady Statistic RA Percent Paced: 31 %
Brady Statistic RV Percent Paced: 12 %
Date Time Interrogation Session: 20260109022100
Implantable Lead Connection Status: 753985
Implantable Lead Connection Status: 753985
Implantable Lead Implant Date: 20210716
Implantable Lead Implant Date: 20210716
Implantable Lead Location: 753859
Implantable Lead Location: 753860
Implantable Lead Model: 7840
Implantable Lead Model: 7841
Implantable Lead Serial Number: 1017229
Implantable Lead Serial Number: 1090224
Implantable Pulse Generator Implant Date: 20210716
Lead Channel Impedance Value: 540 Ohm
Lead Channel Impedance Value: 599 Ohm
Lead Channel Pacing Threshold Amplitude: 0.5 V
Lead Channel Pacing Threshold Amplitude: 1.1 V
Lead Channel Pacing Threshold Pulse Width: 0.4 ms
Lead Channel Pacing Threshold Pulse Width: 0.4 ms
Lead Channel Setting Pacing Amplitude: 2 V
Lead Channel Setting Pacing Amplitude: 2.5 V
Lead Channel Setting Pacing Pulse Width: 0.4 ms
Lead Channel Setting Sensing Sensitivity: 2.5 mV
Pulse Gen Serial Number: 547553
Zone Setting Status: 755011

## 2024-01-19 NOTE — Progress Notes (Signed)
 "  Office Visit Note   Patient: Allison Norman           Date of Birth: 15-Dec-1924           MRN: [id number] Visit Date: 01/19/2024              Requested by: Okey Carlin Redbird, MD [address] [place],  [place] [address] PCP: Okey Carlin Redbird, MD  Chief Complaint  Patient presents with   Left Leg - Routine Post Op    11/06/2023 LLE debridement      HPI: JASMYNN PFALZGRAF is a 89 year old female with a chronic skin ulcer who presents for follow-up of wound care and dressing management.  She has now received insurance approval for application of Kerecis.  She is here for exam and treatment.    Assessment & Plan: Visit Diagnoses: No diagnosis found.  Plan:  Kerecis was applied to the 3 wound beds, covered with adaptic then dry dressing with moderate ace wrap.  She will continue to elevate for edema.    Follow-Up Instructions: No follow-ups on file.   Ortho Exam  Patient is alert, oriented, no adenopathy, well-dressed, normal affect, normal respiratory effort. The wound are beefy red flat wounds with 100 % granulation tissue.  The anterior shin wound 1.5 cm x 1.6 cm, 3.2 x 2 cm,  1.5 cm x 1 cm.  No cellulitis or edema.      Imaging:        Labs: Lab Results  Component Value Date   ESRSEDRATE 55 (H) 08/14/2010   ESRSEDRATE 32 (H) 01/21/2010   ESRSEDRATE 123 (H) 09/27/2009   CRP 0.7 (H) 08/14/2010   CRP 0.5 01/21/2010   CRP 24.3 (H) 09/27/2009   REPTSTATUS 03/14/2023 FINAL 03/09/2023   GRAMSTAIN  03/25/2021    FEW WBC PRESENT, PREDOMINANTLY PMN FEW GRAM POSITIVE COCCI IN PAIRS IN CLUSTERS Performed at [facility], [address], [place], [place] [address]    CULT  03/09/2023    NO GROWTH 5 DAYS Performed at [facility], [address], [place], [place] [address]    LABORGA METHICILLIN RESISTANT STAPHYLOCOCCUS AUREUS 03/25/2021     Lab Results  Component Value Date   ALBUMIN  2.7 (L) 11/04/2023   ALBUMIN  3.1 (L) 11/03/2023   ALBUMIN   2.7 (L) 07/28/2023   PREALBUMIN 10.3 (L) 03/25/2021    Lab Results  Component Value Date   MG 2.3 08/04/2023   MG 2.4 03/25/2021   MG 2.3 03/24/2021   No results found for: [facility]  Lab Results  Component Value Date   PREALBUMIN 10.3 (L) 03/25/2021      Latest Ref Rng & Units 11/09/2023    4:47 AM 11/08/2023    5:20 AM 11/07/2023    4:30 AM  CBC EXTENDED  WBC 4.0 - 10.5 K/uL 6.3  7.7  5.9   RBC 3.87 - 5.11 MIL/uL 3.04  3.51  3.37   Hemoglobin 12.0 - 15.0 g/dL 9.4  89.3  89.7   HCT 63.9 - 46.0 % 30.1  34.4  32.1   Platelets 150 - 400 K/uL 183  217  199      There is no height or weight on file to calculate BMI.  Orders:  No orders of the defined types were placed in this encounter.  No orders of the defined types were placed in this encounter.    Procedures: No procedures performed  Clinical Data: No additional findings.  ROS:  All other systems negative, except as noted in the HPI. Review of Systems  Objective: Vital Signs: There were no vitals taken for this visit.  Specialty Comments:  No specialty comments available.  PMFS History: Patient Active Problem List   Diagnosis Date Noted   Hematoma of left lower extremity 11/04/2023   Chronic respiratory failure with hypoxia (HCC) 11/03/2023   Posttraumatic pretibial hematoma of left lower extremity, initial encounter 11/02/2023   Edema due to hypoalbuminemia 08/05/2023   Acute on chronic diastolic CHF (congestive heart failure) (HCC) 07/28/2023   Aortic stenosis 08/20/2021   Blepharitis of lower eyelids of both eyes 07/23/2021   Protein-calorie malnutrition, severe 03/27/2021   Acute on chronic respiratory failure with hypoxia (HCC) - chronically on 2 L/min 03/24/2021   History of pulmonary embolus (PE) 03/24/2021   Pulmonary nodule 03/24/2021   Chronic diastolic CHF (congestive heart failure) (HCC) 03/24/2021   Pulmonary hypertension, unspecified (HCC) 02/14/2021   Vitreomacular traction syndrome of  right eye 01/28/2021   Iron  deficiency anemia 09/07/2020   CKD (chronic kidney disease), stage IV (HCC) - baseline scr 1.7-2.0    Advanced nonexudative age-related macular degeneration of right eye without subfoveal involvement 03/15/2020   Pacemaker 11/01/2019   Tachycardia-bradycardia syndrome (HCC) 08/05/2019   Bradycardia 07/05/2019   Meibomian blepharitis, left 06/28/2019   Exudative age-related macular degeneration of right eye with active choroidal neovascularization (HCC) 05/03/2019   Cystoid macular edema of right eye 05/03/2019   Advanced nonexudative age-related macular degeneration of left eye with subfoveal involvement 05/03/2019   Fusion beats 08/03/2017   Atypical atrial flutter (HCC)    Persistent atrial fibrillation (HCC) 08/19/2016   On anticoagulant therapy 07/10/2016   PAF (paroxysmal atrial fibrillation) (HCC)    Essential hypertension 07/16/2009   TEMPORAL ARTERITIS 07/16/2009   Polymyalgia rheumatica 07/16/2009   PYOGENIC ARTHRITIS, LOWER LEG 07/02/2009   Past Medical History:  Diagnosis Date   Acute blood loss anemia 08/27/2020   Acute hypoxemic respiratory failure (HCC) 03/24/2021   Acute urinary retention 09/07/2020   Aortic stenosis 08/20/2021   Mild-moderate.  Repeat echo in one year.   Atrial fibrillation (HCC) 2017   a. s/p DCCV in 10/2015  b. recurrent in 08/2016 --> rate-control pursued.    Atrial fibrillation with RVR (HCC) 08/31/2015   Cancer (HCC)    Breast   CAP (community acquired pneumonia) 03/24/2021   CKD (chronic kidney disease)    GCA (giant cell arteritis) (HCC)    Hordeolum internum left lower eyelid 06/28/2019   Hypertension    Laceration of right lower extremity 08/28/2020   Macular degeneration    Methicillin resistant Staphylococcus aureus infection 07/02/2009   Annotation: septic right knee  Qualifier: Diagnosis of   By: Lutricia OBIE Aquas      IMO SNOMED Dx Update Oct 2024     Osteoporosis    PMR (polymyalgia rheumatica)     Pulmonary hypertension, unspecified (HCC) 02/14/2021   Resistant hypertension 03/15/2019   Shortness of breath 03/15/2019   Skin cancer     Family History  Problem Relation Age of Onset   Stroke Mother    Kidney failure Father        died at 30   Heart disease Sister        died at 25   Heart disease Sister        died at 63   Breast cancer Daughter     Past Surgical History:  Procedure Laterality Date   APPLICATION OF WOUND  VAC Left 11/06/2023   Procedure: APPLICATION, WOUND VAC;  Surgeon: Harden Jerona GAILS, MD;  Location[facility] OR;  Service: Orthopedics;  Laterality: Left;   CARDIOVERSION N/A 10/18/2015   Procedure: CARDIOVERSION;  Surgeon: Oneil JAYSON Parchment, MD;  Location: [facility] ENDOSCOPY;  Service: Cardiovascular;  Laterality: N/A;   CARDIOVERSION N/A 02/20/2017   Procedure: CARDIOVERSION;  Surgeon: Mona Vinie JAYSON, MD;  Location: [facility] ENDOSCOPY;  Service: Cardiovascular;  Laterality: N/A;   CARDIOVERSION N/A 06/18/2017   Procedure: CARDIOVERSION;  Surgeon: Francyne Headland, MD;  Location: [facility] ENDOSCOPY;  Service: Cardiovascular;  Laterality: N/A;   CARDIOVERSION N/A 12/21/2018   Procedure: CARDIOVERSION;  Surgeon: Alveta Aleene PARAS, MD;  Location: [facility] ENDOSCOPY;  Service: Cardiovascular;  Laterality: N/A;   INCISION AND DRAINAGE OF DEEP ABSCESS, CALF Left 11/06/2023   Procedure: IRRIGATION AND DEBRIDEMENT LEFT LEG;  Surgeon: Harden Jerona GAILS, MD;  Location: [facility] OR;  Service: Orthopedics;  Laterality: Left;  LEFT LEG DEBRIDEMENT   IR CATHETER TUBE CHANGE  02/28/2021   KNEE SURGERY  2013   MASTECTOMY  1998   left side   PACEMAKER IMPLANT N/A 07/22/2019   Procedure: PACEMAKER IMPLANT;  Surgeon: Waddell Danelle ORN, MD;  Location: [facility] INVASIVE CV LAB;  Service: Cardiovascular;  Laterality: N/A;   Social History   Occupational History   Not on file  Tobacco Use   Smoking status: Former    Current packs/day: 0.00    Types: Cigarettes    Quit date: 08/31/1966    Years since quitting: 57.4    Smokeless tobacco: Never  Vaping Use   Vaping status: Never Used  Substance and Sexual Activity   Alcohol use: No   Drug use: No   Sexual activity: Not on file       "

## 2024-01-20 NOTE — Progress Notes (Signed)
 Remote PPM Transmission

## 2024-01-26 ENCOUNTER — Ambulatory Visit: Admitting: Orthopedic Surgery

## 2024-01-26 DIAGNOSIS — Z9889 Other specified postprocedural states: Secondary | ICD-10-CM

## 2024-01-26 DIAGNOSIS — T148XXA Other injury of unspecified body region, initial encounter: Secondary | ICD-10-CM

## 2024-01-28 ENCOUNTER — Other Ambulatory Visit: Payer: Self-pay | Admitting: Internal Medicine

## 2024-01-28 ENCOUNTER — Encounter: Payer: Self-pay | Admitting: Orthopedic Surgery

## 2024-01-28 NOTE — Progress Notes (Signed)
 "  Office Visit Note   Patient: Allison Norman           Date of Birth: [birth year]           MRN: [id number] Visit Date: 01/26/2024              Requested by: Okey Carlin Redbird, MD [address] [place],  [place] [address] PCP: Okey Carlin Redbird, MD  Chief Complaint  Patient presents with   Left Leg - Routine Post Op    11/06/2023 I&D left leg      HPI: Discussed the use of AI scribe software for clinical note transcription with the patient, who gave verbal consent to proceed.  History of Present Illness Allison Norman is a 89 year old female with an open wound of the left lower leg who presents for follow-up evaluation of wound healing.  Approximately two months ago, she sustained a large traumatic wound to the left lower leg. She expresses concern regarding the slow pace of recovery, stating the wound ought to be better by now.  She has not performed dressing changes herself and has kept the wound wrapped since her last clinic visit. She showered yesterday, leaving the dressing in place until after the shower due to difficulty reapplying it independently. She applies Vaseline daily to the wound to prevent the dressing from adhering to the skin, as removal without Vaseline causes bleeding and skin trauma. The two remaining areas consistently bleed when the dressing is removed without Vaseline.  She denies fever, increased pain, or signs of infection. No new medications have been started since her last visit.     Assessment & Plan: Visit Diagnoses:  1. S/P evacuation of hematoma   2. Traumatic hematoma     Plan: Assessment and Plan Assessment & Plan Open wound of left lower leg Healing wound with good epithelialization, two small granulation areas. Minor bleeding due to dressing adherence. - Applied Vashe-moistened 4x4 gauze and ACE wrap. - Instructed her to continue daily dressing changes with petrolatum  and gauze. - Advised leg elevation above heart level. -  Scheduled follow-up in one week for re-evaluation.      Follow-Up Instructions: No follow-ups on file.   Ortho Exam  Patient is alert, oriented, no adenopathy, well-dressed, normal affect, normal respiratory effort. Physical Exam EXTREMITIES: Significant healing of traumatic wound on left leg with good epithelialization, except for two 1 cm areas with granulation tissue.      Imaging: No results found. No images are attached to the encounter.  Labs: Lab Results  Component Value Date   ESRSEDRATE 55 (H) 08/14/2010   ESRSEDRATE 32 (H) 01/21/2010   ESRSEDRATE 123 (H) 09/27/2009   CRP 0.7 (H) 08/14/2010   CRP 0.5 01/21/2010   CRP 24.3 (H) 09/27/2009   REPTSTATUS 03/14/2023 FINAL 03/09/2023   GRAMSTAIN  03/25/2021    FEW WBC PRESENT, PREDOMINANTLY PMN FEW GRAM POSITIVE COCCI IN PAIRS IN CLUSTERS Performed at [facility], [address] [place], [place] [address]    CULT  03/09/2023    NO GROWTH 5 DAYS Performed at [facility], [address] [place], [place] [address]    LABORGA METHICILLIN RESISTANT STAPHYLOCOCCUS AUREUS 03/25/2021     Lab Results  Component Value Date   ALBUMIN  2.7 (L) 11/04/2023   ALBUMIN  3.1 (L) 11/03/2023   ALBUMIN  2.7 (L) 07/28/2023   PREALBUMIN 10.3 (L) 03/25/2021    Lab Results  Component Value Date   MG 2.3  08/04/2023   MG 2.4 03/25/2021   MG 2.3 03/24/2021   No results found for: [facility]  Lab Results  Component Value Date   PREALBUMIN 10.3 (L) 03/25/2021      Latest Ref Rng & Units 11/09/2023    4:47 AM 11/08/2023    5:20 AM 11/07/2023    4:30 AM  CBC EXTENDED  WBC 4.0 - 10.5 K/uL 6.3  7.7  5.9   RBC 3.87 - 5.11 MIL/uL 3.04  3.51  3.37   Hemoglobin 12.0 - 15.0 g/dL 9.4  89.3  89.7   HCT 63.9 - 46.0 % 30.1  34.4  32.1   Platelets 150 - 400 K/uL 183  217  199      There is no height or weight on file to calculate BMI.  Orders:  No orders of the defined types were placed in this encounter.  No orders of  the defined types were placed in this encounter.    Procedures: No procedures performed  Clinical Data: No additional findings.  ROS:  All other systems negative, except as noted in the HPI. Review of Systems  Objective: Vital Signs: There were no vitals taken for this visit.  Specialty Comments:  No specialty comments available.  PMFS History: Patient Active Problem List   Diagnosis Date Noted   Hematoma of left lower extremity 11/04/2023   Chronic respiratory failure with hypoxia (HCC) 11/03/2023   Posttraumatic pretibial hematoma of left lower extremity, initial encounter 11/02/2023   Edema due to hypoalbuminemia 08/05/2023   Acute on chronic diastolic CHF (congestive heart failure) (HCC) 07/28/2023   Aortic stenosis 08/20/2021   Blepharitis of lower eyelids of both eyes 07/23/2021   Protein-calorie malnutrition, severe 03/27/2021   Acute on chronic respiratory failure with hypoxia (HCC) - chronically on 2 L/min 03/24/2021   History of pulmonary embolus (PE) 03/24/2021   Pulmonary nodule 03/24/2021   Chronic diastolic CHF (congestive heart failure) (HCC) 03/24/2021   Pulmonary hypertension, unspecified (HCC) 02/14/2021   Vitreomacular traction syndrome of right eye 01/28/2021   Iron  deficiency anemia 09/07/2020   CKD (chronic kidney disease), stage IV (HCC) - baseline scr 1.7-2.0    Advanced nonexudative age-related macular degeneration of right eye without subfoveal involvement 03/15/2020   Pacemaker 11/01/2019   Tachycardia-bradycardia syndrome (HCC) 08/05/2019   Bradycardia 07/05/2019   Meibomian blepharitis, left 06/28/2019   Exudative age-related macular degeneration of right eye with active choroidal neovascularization (HCC) 05/03/2019   Cystoid macular edema of right eye 05/03/2019   Advanced nonexudative age-related macular degeneration of left eye with subfoveal involvement 05/03/2019   Fusion beats 08/03/2017   Atypical atrial flutter (HCC)    Persistent  atrial fibrillation (HCC) 08/19/2016   On anticoagulant therapy 07/10/2016   PAF (paroxysmal atrial fibrillation) (HCC)    Essential hypertension 07/16/2009   TEMPORAL ARTERITIS 07/16/2009   Polymyalgia rheumatica 07/16/2009   PYOGENIC ARTHRITIS, LOWER LEG 07/02/2009   Past Medical History:  Diagnosis Date   Acute blood loss anemia 08/27/2020   Acute hypoxemic respiratory failure (HCC) 03/24/2021   Acute urinary retention 09/07/2020   Aortic stenosis 08/20/2021   Mild-moderate.  Repeat echo in one year.   Atrial fibrillation (HCC) 2017   a. s/p DCCV in 10/2015  b. recurrent in 08/2016 --> rate-control pursued.    Atrial fibrillation with RVR (HCC) 08/31/2015   Cancer (HCC)    Breast   CAP (community acquired pneumonia) 03/24/2021   CKD (chronic kidney disease)    GCA (giant cell arteritis) (HCC)  Hordeolum internum left lower eyelid 06/28/2019   Hypertension    Laceration of right lower extremity 08/28/2020   Macular degeneration    Methicillin resistant Staphylococcus aureus infection 07/02/2009   Annotation: septic right knee  Qualifier: Diagnosis of   By: Lutricia OBIE Aquas      IMO SNOMED Dx Update Oct 2024     Osteoporosis    PMR (polymyalgia rheumatica)    Pulmonary hypertension, unspecified (HCC) 02/14/2021   Resistant hypertension 03/15/2019   Shortness of breath 03/15/2019   Skin cancer     Family History  Problem Relation Age of Onset   Stroke Mother    Kidney failure Father        died at 36   Heart disease Sister        died at 41   Heart disease Sister        died at 58   Breast cancer Daughter     Past Surgical History:  Procedure Laterality Date   APPLICATION OF WOUND VAC Left 11/06/2023   Procedure: APPLICATION, WOUND VAC;  Surgeon: Harden Jerona GAILS, MD;  Location: [facility] OR;  Service: Orthopedics;  Laterality: Left;   CARDIOVERSION N/A 10/18/2015   Procedure: CARDIOVERSION;  Surgeon: Oneil JAYSON Parchment, MD;  Location: [facility] ENDOSCOPY;  Service:  Cardiovascular;  Laterality: N/A;   CARDIOVERSION N/A 02/20/2017   Procedure: CARDIOVERSION;  Surgeon: Mona Vinie JAYSON, MD;  Location: [facility] ENDOSCOPY;  Service: Cardiovascular;  Laterality: N/A;   CARDIOVERSION N/A 06/18/2017   Procedure: CARDIOVERSION;  Surgeon: Francyne Headland, MD;  Location: [facility] ENDOSCOPY;  Service: Cardiovascular;  Laterality: N/A;   CARDIOVERSION N/A 12/21/2018   Procedure: CARDIOVERSION;  Surgeon: Alveta Aleene PARAS, MD;  Location: [facility] ENDOSCOPY;  Service: Cardiovascular;  Laterality: N/A;   INCISION AND DRAINAGE OF DEEP ABSCESS, CALF Left 11/06/2023   Procedure: IRRIGATION AND DEBRIDEMENT LEFT LEG;  Surgeon: Harden Jerona GAILS, MD;  Location: [facility] OR;  Service: Orthopedics;  Laterality: Left;  LEFT LEG DEBRIDEMENT   IR CATHETER TUBE CHANGE  02/28/2021   KNEE SURGERY  2013   MASTECTOMY  1998   left side   PACEMAKER IMPLANT N/A 07/22/2019   Procedure: PACEMAKER IMPLANT;  Surgeon: Waddell Danelle ORN, MD;  Location: [facility] INVASIVE CV LAB;  Service: Cardiovascular;  Laterality: N/A;   Social History   Occupational History   Not on file  Tobacco Use   Smoking status: Former    Current packs/day: 0.00    Types: Cigarettes    Quit date: 08/31/1966    Years since quitting: 57.4   Smokeless tobacco: Never  Vaping Use   Vaping status: Never Used  Substance and Sexual Activity   Alcohol use: No   Drug use: No   Sexual activity: Not on file         "

## 2024-02-04 NOTE — Telephone Encounter (Signed)
 In accordance with refill protocols, please review and address the following requirements before this medication refill can be authorized:  EKG , Labs , and Other testing

## 2024-02-04 NOTE — Telephone Encounter (Signed)
 Patient has an upcoming appointment with Daphne Barrack NP, will send message to her to address EKG, lab work (TSH), and other testing (chest xray and eye exam) at her appointment if needed. Will refill until appointment.

## 2024-02-09 ENCOUNTER — Encounter: Admitting: Physician Assistant

## 2024-02-12 ENCOUNTER — Ambulatory Visit: Admitting: Physician Assistant

## 2024-02-12 ENCOUNTER — Encounter: Payer: Self-pay | Admitting: Physician Assistant

## 2024-02-12 DIAGNOSIS — T148XXA Other injury of unspecified body region, initial encounter: Secondary | ICD-10-CM

## 2024-02-12 DIAGNOSIS — Z9889 Other specified postprocedural states: Secondary | ICD-10-CM

## 2024-02-12 NOTE — Progress Notes (Signed)
 "  Office Visit Note   Patient: Allison Norman           Date of Birth: 06/12/1924           MRN: [id number] Visit Date: 02/12/2024              Requested by: [place] [address] [place],  [place] [address] PCP: Okey Carlin Redbird, MD  Chief Complaint  Patient presents with   Left Leg - Follow-up    11/06/2023 I&D left leg      HPI: Allison Norman is a 89 year old female with a chronic skin ulcer who presents for follow-up of wound care and dressing management.  She has now received insurance approval for application of Kerecis.  She is here for exam and treatment.   She denies fever and chills and is pleased with the healing of the wound.    She has a new wound on the lateral right leg she hit her leg on her Rolator.    Assessment & Plan: Visit Diagnoses:  1. S/P evacuation of hematoma   2. Traumatic hematoma     Plan: Donated Kerecis was applied to the remaining wound, covered with Adaptic and 4 x 4 guaze.  Ace wrap mild compression.   They will clean the right lateral leg with Vashe daily.  She may shower with soap and water.   Follow-Up Instructions: No follow-ups on file.   Ortho Exam  Patient is alert, oriented, no adenopathy, well-dressed, normal affect, normal respiratory effort. The left anterior shine wound measures 1 cm x 1 cm with depth of 2 mm.  No surrounding cellulitis.    The right lateral leg has a crescent shaped mark from dried blood/scab.  No cellulitis or edema.            Imaging: No results found.     Labs: Lab Results  Component Value Date   ESRSEDRATE 55 (H) 08/14/2010   ESRSEDRATE 32 (H) 01/21/2010   ESRSEDRATE 123 (H) 09/27/2009   CRP 0.7 (H) 08/14/2010   CRP 0.5 01/21/2010   CRP 24.3 (H) 09/27/2009   REPTSTATUS 03/14/2023 FINAL 03/09/2023   GRAMSTAIN  03/25/2021    FEW WBC PRESENT, PREDOMINANTLY PMN FEW GRAM POSITIVE COCCI IN PAIRS IN CLUSTERS Performed at [facility], [address]  [place], [place] [address]    CULT  03/09/2023    NO GROWTH 5 DAYS Performed at [facility], [address] [place], [place] [address]    LABORGA METHICILLIN RESISTANT STAPHYLOCOCCUS AUREUS 03/25/2021     Lab Results  Component Value Date   ALBUMIN  2.7 (L) 11/04/2023   ALBUMIN  3.1 (L) 11/03/2023   ALBUMIN  2.7 (L) 07/28/2023   PREALBUMIN 10.3 (L) 03/25/2021    Lab Results  Component Value Date   MG 2.3 08/04/2023   MG 2.4 03/25/2021   MG 2.3 03/24/2021   No results found for: [facility]  Lab Results  Component Value Date   PREALBUMIN 10.3 (L) 03/25/2021      Latest Ref Rng & Units 11/09/2023    4:47 AM 11/08/2023    5:20 AM 11/07/2023    4:30 AM  CBC EXTENDED  WBC 4.0 - 10.5 K/uL 6.3  7.7  5.9   RBC 3.87 - 5.11 MIL/uL 3.04  3.51  3.37   Hemoglobin 12.0 - 15.0 g/dL 9.4  89.3  89.7   HCT 63.9 - 46.0 % 30.1  34.4  32.1  Platelets 150 - 400 K/uL 183  217  199      There is no height or weight on file to calculate BMI.  Orders:  No orders of the defined types were placed in this encounter.  No orders of the defined types were placed in this encounter.    Procedures: No procedures performed  Clinical Data: No additional findings.  ROS:  All other systems negative, except as noted in the HPI. Review of Systems  Objective: Vital Signs: There were no vitals taken for this visit.  Specialty Comments:  No specialty comments available.  PMFS History: Patient Active Problem List   Diagnosis Date Noted   Hematoma of left lower extremity 11/04/2023   Chronic respiratory failure with hypoxia (HCC) 11/03/2023   Posttraumatic pretibial hematoma of left lower extremity, initial encounter 11/02/2023   Edema due to hypoalbuminemia 08/05/2023   Acute on chronic diastolic CHF (congestive heart failure) (HCC) 07/28/2023   Aortic stenosis 08/20/2021   Blepharitis of lower eyelids of both eyes 07/23/2021   Protein-calorie malnutrition, severe 03/27/2021   Acute on  chronic respiratory failure with hypoxia (HCC) - chronically on 2 L/min 03/24/2021   History of pulmonary embolus (PE) 03/24/2021   Pulmonary nodule 03/24/2021   Chronic diastolic CHF (congestive heart failure) (HCC) 03/24/2021   Pulmonary hypertension, unspecified (HCC) 02/14/2021   Vitreomacular traction syndrome of right eye 01/28/2021   Iron  deficiency anemia 09/07/2020   CKD (chronic kidney disease), stage IV (HCC) - baseline scr 1.7-2.0    Advanced nonexudative age-related macular degeneration of right eye without subfoveal involvement 03/15/2020   Pacemaker 11/01/2019   Tachycardia-bradycardia syndrome (HCC) 08/05/2019   Bradycardia 07/05/2019   Meibomian blepharitis, left 06/28/2019   Exudative age-related macular degeneration of right eye with active choroidal neovascularization (HCC) 05/03/2019   Cystoid macular edema of right eye 05/03/2019   Advanced nonexudative age-related macular degeneration of left eye with subfoveal involvement 05/03/2019   Fusion beats 08/03/2017   Atypical atrial flutter (HCC)    Persistent atrial fibrillation (HCC) 08/19/2016   On anticoagulant therapy 07/10/2016   PAF (paroxysmal atrial fibrillation) (HCC)    Essential hypertension 07/16/2009   TEMPORAL ARTERITIS 07/16/2009   Polymyalgia rheumatica 07/16/2009   PYOGENIC ARTHRITIS, LOWER LEG 07/02/2009   Past Medical History:  Diagnosis Date   Acute blood loss anemia 08/27/2020   Acute hypoxemic respiratory failure (HCC) 03/24/2021   Acute urinary retention 09/07/2020   Aortic stenosis 08/20/2021   Mild-moderate.  Repeat echo in one year.   Atrial fibrillation (HCC) 2017   a. s/p DCCV in 10/2015  b. recurrent in 08/2016 --> rate-control pursued.    Atrial fibrillation with RVR (HCC) 08/31/2015   Cancer (HCC)    Breast   CAP (community acquired pneumonia) 03/24/2021   CKD (chronic kidney disease)    GCA (giant cell arteritis) (HCC)    Hordeolum internum left lower eyelid 06/28/2019    Hypertension    Laceration of right lower extremity 08/28/2020   Macular degeneration    Methicillin resistant Staphylococcus aureus infection 07/02/2009   Annotation: septic right knee  Qualifier: Diagnosis of   By: Lutricia OBIE Aquas      IMO SNOMED Dx Update Oct 2024     Osteoporosis    PMR (polymyalgia rheumatica)    Pulmonary hypertension, unspecified (HCC) 02/14/2021   Resistant hypertension 03/15/2019   Shortness of breath 03/15/2019   Skin cancer     Family History  Problem Relation Age of Onset   Stroke Mother  Kidney failure Father        died at 36   Heart disease Sister        died at 9   Heart disease Sister        died at 63   Breast cancer Daughter     Past Surgical History:  Procedure Laterality Date   APPLICATION OF WOUND VAC Left 11/06/2023   Procedure: APPLICATION, WOUND VAC;  Surgeon: Harden Jerona GAILS, MD;  Location: [facility] OR;  Service: Orthopedics;  Laterality: Left;   CARDIOVERSION N/A 10/18/2015   Procedure: CARDIOVERSION;  Surgeon: Oneil JAYSON Parchment, MD;  Location: [facility] ENDOSCOPY;  Service: Cardiovascular;  Laterality: N/A;   CARDIOVERSION N/A 02/20/2017   Procedure: CARDIOVERSION;  Surgeon: Mona Vinie JAYSON, MD;  Location: [facility] ENDOSCOPY;  Service: Cardiovascular;  Laterality: N/A;   CARDIOVERSION N/A 06/18/2017   Procedure: CARDIOVERSION;  Surgeon: Francyne Headland, MD;  Location: [facility] ENDOSCOPY;  Service: Cardiovascular;  Laterality: N/A;   CARDIOVERSION N/A 12/21/2018   Procedure: CARDIOVERSION;  Surgeon: Alveta Aleene PARAS, MD;  Location: [facility] ENDOSCOPY;  Service: Cardiovascular;  Laterality: N/A;   INCISION AND DRAINAGE OF DEEP ABSCESS, CALF Left 11/06/2023   Procedure: IRRIGATION AND DEBRIDEMENT LEFT LEG;  Surgeon: Harden Jerona GAILS, MD;  Location: [facility] OR;  Service: Orthopedics;  Laterality: Left;  LEFT LEG DEBRIDEMENT   IR CATHETER TUBE CHANGE  02/28/2021   KNEE SURGERY  2013   MASTECTOMY  1998   left side   PACEMAKER IMPLANT N/A 07/22/2019   Procedure: PACEMAKER  IMPLANT;  Surgeon: Waddell Danelle ORN, MD;  Location: [facility] INVASIVE CV LAB;  Service: Cardiovascular;  Laterality: N/A;   Social History   Occupational History   Not on file  Tobacco Use   Smoking status: Former    Current packs/day: 0.00    Types: Cigarettes    Quit date: 08/31/1966    Years since quitting: 57.4   Smokeless tobacco: Never  Vaping Use   Vaping status: Never Used  Substance and Sexual Activity   Alcohol use: No   Drug use: No   Sexual activity: Not on file       "

## 2024-02-16 ENCOUNTER — Ambulatory Visit: Admitting: Pulmonary Disease

## 2024-02-19 ENCOUNTER — Encounter: Admitting: Physician Assistant

## 2024-02-22 ENCOUNTER — Ambulatory Visit (HOSPITAL_BASED_OUTPATIENT_CLINIC_OR_DEPARTMENT_OTHER): Admitting: Nurse Practitioner

## 2024-04-15 ENCOUNTER — Encounter

## 2024-07-15 ENCOUNTER — Encounter

## 2024-10-14 ENCOUNTER — Encounter

## 2025-01-13 ENCOUNTER — Encounter

## 2025-04-14 ENCOUNTER — Encounter
